# Patient Record
Sex: Female | Born: 1945
Health system: Southern US, Community
[De-identification: ages and names within clinical notes are randomized; demographics above are authoritative.]

## PROBLEM LIST (undated history)

## (undated) DIAGNOSIS — C801 Malignant (primary) neoplasm, unspecified: Secondary | ICD-10-CM

## (undated) DIAGNOSIS — I7101 Dissection of thoracic aorta: Secondary | ICD-10-CM

## (undated) DIAGNOSIS — Z95810 Presence of automatic (implantable) cardiac defibrillator: Secondary | ICD-10-CM

## (undated) DIAGNOSIS — K5790 Diverticulosis of intestine, part unspecified, without perforation or abscess without bleeding: Secondary | ICD-10-CM

## (undated) DIAGNOSIS — N183 Chronic kidney disease, stage 3 unspecified: Secondary | ICD-10-CM

## (undated) DIAGNOSIS — K219 Gastro-esophageal reflux disease without esophagitis: Secondary | ICD-10-CM

## (undated) DIAGNOSIS — N200 Calculus of kidney: Secondary | ICD-10-CM

## (undated) DIAGNOSIS — E785 Hyperlipidemia, unspecified: Secondary | ICD-10-CM

## (undated) DIAGNOSIS — I472 Ventricular tachycardia, unspecified: Secondary | ICD-10-CM

## (undated) DIAGNOSIS — I350 Nonrheumatic aortic (valve) stenosis: Secondary | ICD-10-CM

## (undated) DIAGNOSIS — Z87442 Personal history of urinary calculi: Secondary | ICD-10-CM

## (undated) DIAGNOSIS — I495 Sick sinus syndrome: Secondary | ICD-10-CM

## (undated) DIAGNOSIS — F419 Anxiety disorder, unspecified: Secondary | ICD-10-CM

## (undated) DIAGNOSIS — I10 Essential (primary) hypertension: Secondary | ICD-10-CM

## (undated) HISTORY — DX: Chronic kidney disease, stage 3 unspecified: N18.30

## (undated) HISTORY — DX: Dissection of thoracic aorta: I71.01

## (undated) HISTORY — DX: Diverticulosis of intestine, part unspecified, without perforation or abscess without bleeding: K57.90

## (undated) HISTORY — DX: Calculus of kidney: N20.0

## (undated) HISTORY — DX: Ventricular tachycardia, unspecified: I47.20

## (undated) HISTORY — DX: Gastro-esophageal reflux disease without esophagitis: K21.9

## (undated) HISTORY — DX: Anxiety disorder, unspecified: F41.9

## (undated) HISTORY — DX: Nonrheumatic aortic (valve) stenosis: I35.0

## (undated) HISTORY — DX: Essential (primary) hypertension: I10

## (undated) HISTORY — DX: Malignant (primary) neoplasm, unspecified: C80.1

## (undated) HISTORY — DX: Sick sinus syndrome: I49.5

## (undated) HISTORY — PX: APPENDECTOMY: SHX54

## (undated) HISTORY — DX: Hyperlipidemia, unspecified: E78.5

## (undated) HISTORY — DX: Ventricular tachycardia: I47.2

---

## 1982-11-29 HISTORY — PX: ABDOMINAL HYSTERECTOMY: SHX81

## 1998-11-29 HISTORY — PX: THORACIC AORTIC ANEURYSM REPAIR: SHX799

## 2004-03-31 ENCOUNTER — Inpatient Hospital Stay (HOSPITAL_COMMUNITY): Admission: EM | Admit: 2004-03-31 | Discharge: 2004-04-02 | Payer: Self-pay | Admitting: Emergency Medicine

## 2005-08-11 ENCOUNTER — Ambulatory Visit: Payer: Self-pay | Admitting: Family Medicine

## 2005-09-30 ENCOUNTER — Ambulatory Visit (HOSPITAL_COMMUNITY): Admission: RE | Admit: 2005-09-30 | Discharge: 2005-09-30 | Payer: Self-pay | Admitting: Cardiology

## 2006-08-09 ENCOUNTER — Ambulatory Visit: Payer: Self-pay | Admitting: Family Medicine

## 2006-08-12 ENCOUNTER — Encounter: Payer: Self-pay | Admitting: Family Medicine

## 2006-08-12 ENCOUNTER — Other Ambulatory Visit: Admission: RE | Admit: 2006-08-12 | Discharge: 2006-08-12 | Payer: Self-pay | Admitting: Family Medicine

## 2006-08-12 ENCOUNTER — Ambulatory Visit: Payer: Self-pay | Admitting: Family Medicine

## 2006-08-18 ENCOUNTER — Encounter: Admission: RE | Admit: 2006-08-18 | Discharge: 2006-08-18 | Payer: Self-pay | Admitting: Cardiology

## 2006-08-29 HISTORY — PX: PACEMAKER INSERTION: SHX728

## 2006-09-07 ENCOUNTER — Encounter: Admission: RE | Admit: 2006-09-07 | Discharge: 2006-09-07 | Payer: Self-pay | Admitting: *Deleted

## 2006-09-13 ENCOUNTER — Ambulatory Visit (HOSPITAL_COMMUNITY): Admission: RE | Admit: 2006-09-13 | Discharge: 2006-09-14 | Payer: Self-pay | Admitting: *Deleted

## 2006-11-29 DIAGNOSIS — I7101 Dissection of thoracic aorta: Secondary | ICD-10-CM

## 2006-11-29 DIAGNOSIS — I71019 Dissection of thoracic aorta, unspecified: Secondary | ICD-10-CM

## 2006-11-29 HISTORY — PX: REPAIR OF ACUTE ASCENDING THORACIC AORTIC DISSECTION: SHX6323

## 2006-11-29 HISTORY — DX: Dissection of thoracic aorta, unspecified: I71.019

## 2006-11-29 HISTORY — DX: Dissection of thoracic aorta: I71.01

## 2007-05-23 ENCOUNTER — Ambulatory Visit: Payer: Self-pay | Admitting: Family Medicine

## 2007-05-23 DIAGNOSIS — F419 Anxiety disorder, unspecified: Secondary | ICD-10-CM | POA: Insufficient documentation

## 2007-05-23 DIAGNOSIS — E785 Hyperlipidemia, unspecified: Secondary | ICD-10-CM | POA: Insufficient documentation

## 2007-05-23 DIAGNOSIS — I10 Essential (primary) hypertension: Secondary | ICD-10-CM | POA: Insufficient documentation

## 2007-07-07 ENCOUNTER — Ambulatory Visit: Payer: Self-pay | Admitting: Family Medicine

## 2007-07-07 DIAGNOSIS — R7309 Other abnormal glucose: Secondary | ICD-10-CM | POA: Insufficient documentation

## 2007-07-19 LAB — CONVERTED CEMR LAB
BUN: 15 mg/dL (ref 6–23)
Creatinine, Ser: 0.6 mg/dL (ref 0.4–1.2)
Hgb A1c MFr Bld: 6.2 % — ABNORMAL HIGH (ref 4.6–6.0)
Potassium: 3.6 meq/L (ref 3.5–5.1)

## 2007-08-17 ENCOUNTER — Encounter: Admission: RE | Admit: 2007-08-17 | Discharge: 2007-08-17 | Payer: Self-pay | Admitting: Cardiology

## 2007-09-02 ENCOUNTER — Ambulatory Visit: Payer: Self-pay | Admitting: Cardiothoracic Surgery

## 2007-09-02 ENCOUNTER — Inpatient Hospital Stay (HOSPITAL_COMMUNITY): Admission: EM | Admit: 2007-09-02 | Discharge: 2007-09-09 | Payer: Self-pay | Admitting: Emergency Medicine

## 2007-09-02 ENCOUNTER — Encounter: Payer: Self-pay | Admitting: Cardiothoracic Surgery

## 2007-09-21 ENCOUNTER — Encounter: Admission: RE | Admit: 2007-09-21 | Discharge: 2007-09-21 | Payer: Self-pay | Admitting: Cardiothoracic Surgery

## 2007-09-21 ENCOUNTER — Ambulatory Visit: Payer: Self-pay | Admitting: Cardiothoracic Surgery

## 2007-09-21 ENCOUNTER — Encounter: Payer: Self-pay | Admitting: Family Medicine

## 2007-09-28 ENCOUNTER — Ambulatory Visit: Payer: Self-pay | Admitting: Cardiothoracic Surgery

## 2007-09-28 ENCOUNTER — Encounter: Payer: Self-pay | Admitting: Family Medicine

## 2007-10-05 ENCOUNTER — Encounter (HOSPITAL_COMMUNITY): Admission: RE | Admit: 2007-10-05 | Discharge: 2007-11-29 | Payer: Self-pay | Admitting: Cardiology

## 2007-10-23 ENCOUNTER — Ambulatory Visit: Payer: Self-pay | Admitting: Family Medicine

## 2007-10-24 ENCOUNTER — Encounter: Payer: Self-pay | Admitting: Family Medicine

## 2007-10-24 DIAGNOSIS — I7101 Dissection of thoracic aorta: Secondary | ICD-10-CM

## 2007-10-24 DIAGNOSIS — I71019 Dissection of thoracic aorta, unspecified: Secondary | ICD-10-CM | POA: Insufficient documentation

## 2007-11-02 ENCOUNTER — Ambulatory Visit: Payer: Self-pay | Admitting: Family Medicine

## 2007-11-02 DIAGNOSIS — J069 Acute upper respiratory infection, unspecified: Secondary | ICD-10-CM | POA: Insufficient documentation

## 2007-11-08 ENCOUNTER — Telehealth (INDEPENDENT_AMBULATORY_CARE_PROVIDER_SITE_OTHER): Payer: Self-pay | Admitting: *Deleted

## 2007-11-09 ENCOUNTER — Ambulatory Visit: Payer: Self-pay | Admitting: Family Medicine

## 2007-11-09 ENCOUNTER — Encounter: Admission: RE | Admit: 2007-11-09 | Discharge: 2007-11-09 | Payer: Self-pay | Admitting: Family Medicine

## 2007-11-09 DIAGNOSIS — J209 Acute bronchitis, unspecified: Secondary | ICD-10-CM | POA: Insufficient documentation

## 2007-11-09 DIAGNOSIS — R059 Cough, unspecified: Secondary | ICD-10-CM | POA: Insufficient documentation

## 2007-11-09 DIAGNOSIS — R05 Cough: Secondary | ICD-10-CM

## 2007-11-13 ENCOUNTER — Ambulatory Visit: Payer: Self-pay | Admitting: Cardiothoracic Surgery

## 2007-11-16 ENCOUNTER — Telehealth (INDEPENDENT_AMBULATORY_CARE_PROVIDER_SITE_OTHER): Payer: Self-pay | Admitting: *Deleted

## 2007-11-22 ENCOUNTER — Telehealth (INDEPENDENT_AMBULATORY_CARE_PROVIDER_SITE_OTHER): Payer: Self-pay | Admitting: *Deleted

## 2007-11-30 ENCOUNTER — Encounter (HOSPITAL_COMMUNITY): Admission: RE | Admit: 2007-11-30 | Discharge: 2008-01-27 | Payer: Self-pay | Admitting: Cardiology

## 2008-01-03 ENCOUNTER — Telehealth (INDEPENDENT_AMBULATORY_CARE_PROVIDER_SITE_OTHER): Payer: Self-pay | Admitting: *Deleted

## 2008-01-08 ENCOUNTER — Telehealth (INDEPENDENT_AMBULATORY_CARE_PROVIDER_SITE_OTHER): Payer: Self-pay | Admitting: *Deleted

## 2008-02-19 ENCOUNTER — Telehealth (INDEPENDENT_AMBULATORY_CARE_PROVIDER_SITE_OTHER): Payer: Self-pay | Admitting: *Deleted

## 2008-03-20 ENCOUNTER — Ambulatory Visit: Payer: Self-pay | Admitting: Family Medicine

## 2008-03-21 ENCOUNTER — Encounter: Admission: RE | Admit: 2008-03-21 | Discharge: 2008-03-21 | Payer: Self-pay | Admitting: Cardiothoracic Surgery

## 2008-03-21 ENCOUNTER — Ambulatory Visit: Payer: Self-pay | Admitting: Cardiothoracic Surgery

## 2008-04-04 ENCOUNTER — Telehealth: Payer: Self-pay | Admitting: Internal Medicine

## 2008-04-25 ENCOUNTER — Telehealth (INDEPENDENT_AMBULATORY_CARE_PROVIDER_SITE_OTHER): Payer: Self-pay | Admitting: *Deleted

## 2008-05-28 ENCOUNTER — Telehealth (INDEPENDENT_AMBULATORY_CARE_PROVIDER_SITE_OTHER): Payer: Self-pay | Admitting: *Deleted

## 2008-06-10 ENCOUNTER — Telehealth (INDEPENDENT_AMBULATORY_CARE_PROVIDER_SITE_OTHER): Payer: Self-pay | Admitting: *Deleted

## 2008-07-23 ENCOUNTER — Ambulatory Visit: Payer: Self-pay | Admitting: Family Medicine

## 2008-07-23 ENCOUNTER — Encounter (INDEPENDENT_AMBULATORY_CARE_PROVIDER_SITE_OTHER): Payer: Self-pay | Admitting: *Deleted

## 2008-07-23 ENCOUNTER — Ambulatory Visit (HOSPITAL_BASED_OUTPATIENT_CLINIC_OR_DEPARTMENT_OTHER): Admission: RE | Admit: 2008-07-23 | Discharge: 2008-07-23 | Payer: Self-pay | Admitting: Family Medicine

## 2008-07-23 DIAGNOSIS — R221 Localized swelling, mass and lump, neck: Secondary | ICD-10-CM | POA: Insufficient documentation

## 2008-07-23 DIAGNOSIS — R220 Localized swelling, mass and lump, head: Secondary | ICD-10-CM | POA: Insufficient documentation

## 2008-07-24 ENCOUNTER — Telehealth (INDEPENDENT_AMBULATORY_CARE_PROVIDER_SITE_OTHER): Payer: Self-pay | Admitting: *Deleted

## 2008-07-24 LAB — CONVERTED CEMR LAB
ALT: 16 units/L (ref 0–35)
AST: 18 units/L (ref 0–37)
Albumin: 3.8 g/dL (ref 3.5–5.2)
Alkaline Phosphatase: 70 units/L (ref 39–117)
BUN: 15 mg/dL (ref 6–23)
Chloride: 107 meq/L (ref 96–112)
Cholesterol: 134 mg/dL (ref 0–200)
GFR calc Af Amer: 109 mL/min
Glucose, Bld: 121 mg/dL — ABNORMAL HIGH (ref 70–99)
Potassium: 4.6 meq/L (ref 3.5–5.1)
Total Protein: 7.6 g/dL (ref 6.0–8.3)
VLDL: 18 mg/dL (ref 0–40)

## 2008-07-25 ENCOUNTER — Ambulatory Visit: Payer: Self-pay | Admitting: Cardiothoracic Surgery

## 2008-07-25 ENCOUNTER — Encounter: Payer: Self-pay | Admitting: Internal Medicine

## 2008-07-28 LAB — CONVERTED CEMR LAB
Basophils Absolute: 0.1 10*3/uL (ref 0.0–0.1)
Eosinophils Absolute: 0.1 10*3/uL (ref 0.0–0.7)
MCHC: 34.4 g/dL (ref 30.0–36.0)
MCV: 92.8 fL (ref 78.0–100.0)
Neutrophils Relative %: 49.7 % (ref 43.0–77.0)
Platelets: 215 10*3/uL (ref 150–400)
RDW: 13.9 % (ref 11.5–14.6)

## 2008-07-30 ENCOUNTER — Ambulatory Visit: Payer: Self-pay | Admitting: Family Medicine

## 2008-07-30 LAB — CONVERTED CEMR LAB: Sed Rate: 42 mm/hr — ABNORMAL HIGH (ref 0–22)

## 2008-07-31 ENCOUNTER — Encounter (INDEPENDENT_AMBULATORY_CARE_PROVIDER_SITE_OTHER): Payer: Self-pay | Admitting: *Deleted

## 2008-08-15 ENCOUNTER — Telehealth (INDEPENDENT_AMBULATORY_CARE_PROVIDER_SITE_OTHER): Payer: Self-pay | Admitting: *Deleted

## 2008-08-26 ENCOUNTER — Telehealth (INDEPENDENT_AMBULATORY_CARE_PROVIDER_SITE_OTHER): Payer: Self-pay | Admitting: *Deleted

## 2008-09-19 ENCOUNTER — Ambulatory Visit: Payer: Self-pay | Admitting: Family Medicine

## 2008-09-19 DIAGNOSIS — E119 Type 2 diabetes mellitus without complications: Secondary | ICD-10-CM | POA: Insufficient documentation

## 2008-09-23 ENCOUNTER — Telehealth (INDEPENDENT_AMBULATORY_CARE_PROVIDER_SITE_OTHER): Payer: Self-pay | Admitting: *Deleted

## 2008-09-24 ENCOUNTER — Ambulatory Visit: Payer: Self-pay | Admitting: Family Medicine

## 2008-09-29 LAB — CONVERTED CEMR LAB
AST: 19 units/L (ref 0–37)
Albumin: 3.6 g/dL (ref 3.5–5.2)
Alkaline Phosphatase: 78 units/L (ref 39–117)
BUN: 12 mg/dL (ref 6–23)
Bilirubin, Direct: 0.1 mg/dL (ref 0.0–0.3)
Chloride: 108 meq/L (ref 96–112)
Cholesterol: 139 mg/dL (ref 0–200)
GFR calc non Af Amer: 90 mL/min
Glucose, Bld: 124 mg/dL — ABNORMAL HIGH (ref 70–99)
Potassium: 4.1 meq/L (ref 3.5–5.1)

## 2008-09-30 ENCOUNTER — Encounter (INDEPENDENT_AMBULATORY_CARE_PROVIDER_SITE_OTHER): Payer: Self-pay | Admitting: *Deleted

## 2008-10-02 ENCOUNTER — Encounter: Payer: Self-pay | Admitting: Family Medicine

## 2008-10-29 ENCOUNTER — Telehealth (INDEPENDENT_AMBULATORY_CARE_PROVIDER_SITE_OTHER): Payer: Self-pay | Admitting: *Deleted

## 2009-01-23 ENCOUNTER — Encounter: Payer: Self-pay | Admitting: Family Medicine

## 2009-01-27 ENCOUNTER — Telehealth: Payer: Self-pay | Admitting: Family Medicine

## 2009-03-13 ENCOUNTER — Telehealth (INDEPENDENT_AMBULATORY_CARE_PROVIDER_SITE_OTHER): Payer: Self-pay | Admitting: *Deleted

## 2009-03-27 ENCOUNTER — Encounter: Admission: RE | Admit: 2009-03-27 | Discharge: 2009-03-27 | Payer: Self-pay | Admitting: Cardiothoracic Surgery

## 2009-03-27 ENCOUNTER — Ambulatory Visit: Payer: Self-pay | Admitting: Cardiothoracic Surgery

## 2009-04-18 ENCOUNTER — Telehealth (INDEPENDENT_AMBULATORY_CARE_PROVIDER_SITE_OTHER): Payer: Self-pay | Admitting: *Deleted

## 2009-04-21 ENCOUNTER — Telehealth: Payer: Self-pay | Admitting: Family Medicine

## 2009-06-16 ENCOUNTER — Telehealth (INDEPENDENT_AMBULATORY_CARE_PROVIDER_SITE_OTHER): Payer: Self-pay | Admitting: *Deleted

## 2009-06-23 ENCOUNTER — Telehealth (INDEPENDENT_AMBULATORY_CARE_PROVIDER_SITE_OTHER): Payer: Self-pay | Admitting: *Deleted

## 2009-07-03 ENCOUNTER — Encounter: Payer: Self-pay | Admitting: Family Medicine

## 2009-07-10 ENCOUNTER — Ambulatory Visit: Payer: Self-pay | Admitting: Family Medicine

## 2009-07-10 ENCOUNTER — Encounter (INDEPENDENT_AMBULATORY_CARE_PROVIDER_SITE_OTHER): Payer: Self-pay | Admitting: *Deleted

## 2009-07-10 ENCOUNTER — Telehealth (INDEPENDENT_AMBULATORY_CARE_PROVIDER_SITE_OTHER): Payer: Self-pay | Admitting: *Deleted

## 2009-07-10 DIAGNOSIS — Z780 Asymptomatic menopausal state: Secondary | ICD-10-CM | POA: Insufficient documentation

## 2009-07-10 LAB — CONVERTED CEMR LAB
Ketones, urine, test strip: NEGATIVE
Microalb Creat Ratio: 12.5 mg/g (ref 0.0–30.0)
Nitrite: NEGATIVE
Urobilinogen, UA: NEGATIVE

## 2009-07-14 ENCOUNTER — Encounter (INDEPENDENT_AMBULATORY_CARE_PROVIDER_SITE_OTHER): Payer: Self-pay | Admitting: *Deleted

## 2009-07-14 LAB — CONVERTED CEMR LAB
ALT: 17 units/L (ref 0–35)
Alkaline Phosphatase: 64 units/L (ref 39–117)
BUN: 28 mg/dL — ABNORMAL HIGH (ref 6–23)
Basophils Relative: 0.5 % (ref 0.0–3.0)
Bilirubin, Direct: 0 mg/dL (ref 0.0–0.3)
Calcium: 9.8 mg/dL (ref 8.4–10.5)
Chloride: 106 meq/L (ref 96–112)
Creatinine, Ser: 0.9 mg/dL (ref 0.4–1.2)
Eosinophils Relative: 1.9 % (ref 0.0–5.0)
GFR calc non Af Amer: 67.26 mL/min (ref >60)
HDL: 50.5 mg/dL (ref >39.00)
Hgb A1c MFr Bld: 6.1 % (ref 4.6–6.5)
LDL Cholesterol: 84 mg/dL (ref 0–99)
Lymphocytes Relative: 27.1 % (ref 12.0–46.0)
Monocytes Relative: 7.4 % (ref 3.0–12.0)
Neutrophils Relative %: 63.1 % (ref 43.0–77.0)
Platelets: 163 10*3/uL (ref 150.0–400.0)
RBC: 4.1 M/uL (ref 3.87–5.11)
Total Bilirubin: 0.9 mg/dL (ref 0.3–1.2)
Total CHOL/HDL Ratio: 3
Total Protein: 8 g/dL (ref 6.0–8.3)
Triglycerides: 121 mg/dL (ref 0.0–149.0)
VLDL: 24.2 mg/dL (ref 0.0–40.0)
WBC: 6.4 10*3/uL (ref 4.5–10.5)

## 2009-07-22 ENCOUNTER — Telehealth (INDEPENDENT_AMBULATORY_CARE_PROVIDER_SITE_OTHER): Payer: Self-pay | Admitting: *Deleted

## 2009-08-19 ENCOUNTER — Emergency Department (HOSPITAL_COMMUNITY): Admission: EM | Admit: 2009-08-19 | Discharge: 2009-08-19 | Payer: Self-pay | Admitting: Emergency Medicine

## 2009-09-09 ENCOUNTER — Ambulatory Visit: Payer: Self-pay | Admitting: Family Medicine

## 2009-09-12 ENCOUNTER — Telehealth (INDEPENDENT_AMBULATORY_CARE_PROVIDER_SITE_OTHER): Payer: Self-pay | Admitting: *Deleted

## 2009-10-07 ENCOUNTER — Telehealth (INDEPENDENT_AMBULATORY_CARE_PROVIDER_SITE_OTHER): Payer: Self-pay | Admitting: *Deleted

## 2009-11-04 ENCOUNTER — Telehealth: Payer: Self-pay | Admitting: Family Medicine

## 2009-11-11 ENCOUNTER — Telehealth: Payer: Self-pay | Admitting: Family Medicine

## 2009-12-10 ENCOUNTER — Telehealth: Payer: Self-pay | Admitting: Family Medicine

## 2009-12-22 ENCOUNTER — Telehealth: Payer: Self-pay | Admitting: Family Medicine

## 2009-12-23 ENCOUNTER — Telehealth: Payer: Self-pay | Admitting: Family Medicine

## 2009-12-26 ENCOUNTER — Ambulatory Visit: Payer: Self-pay | Admitting: Family Medicine

## 2010-01-07 ENCOUNTER — Encounter: Payer: Self-pay | Admitting: Family Medicine

## 2010-01-16 ENCOUNTER — Encounter: Payer: Self-pay | Admitting: Family Medicine

## 2010-01-29 ENCOUNTER — Telehealth: Payer: Self-pay | Admitting: Family Medicine

## 2010-02-09 ENCOUNTER — Telehealth: Payer: Self-pay | Admitting: Family Medicine

## 2010-03-05 ENCOUNTER — Telehealth: Payer: Self-pay | Admitting: Family Medicine

## 2010-03-05 ENCOUNTER — Encounter: Payer: Self-pay | Admitting: Family Medicine

## 2010-03-05 ENCOUNTER — Encounter: Admission: RE | Admit: 2010-03-05 | Discharge: 2010-03-05 | Payer: Self-pay | Admitting: Cardiothoracic Surgery

## 2010-03-05 ENCOUNTER — Ambulatory Visit: Payer: Self-pay | Admitting: Cardiothoracic Surgery

## 2010-04-20 ENCOUNTER — Telehealth: Payer: Self-pay | Admitting: Family Medicine

## 2010-05-07 ENCOUNTER — Telehealth: Payer: Self-pay | Admitting: Family Medicine

## 2010-06-18 ENCOUNTER — Encounter: Payer: Self-pay | Admitting: Family Medicine

## 2010-08-04 ENCOUNTER — Telehealth: Payer: Self-pay | Admitting: Family Medicine

## 2010-08-17 ENCOUNTER — Telehealth (INDEPENDENT_AMBULATORY_CARE_PROVIDER_SITE_OTHER): Payer: Self-pay | Admitting: *Deleted

## 2010-08-18 ENCOUNTER — Encounter: Payer: Self-pay | Admitting: Family Medicine

## 2010-08-31 ENCOUNTER — Telehealth: Payer: Self-pay | Admitting: Family Medicine

## 2010-10-09 ENCOUNTER — Encounter (INDEPENDENT_AMBULATORY_CARE_PROVIDER_SITE_OTHER): Payer: Self-pay | Admitting: *Deleted

## 2010-10-09 ENCOUNTER — Telehealth: Payer: Self-pay | Admitting: Family Medicine

## 2010-10-12 ENCOUNTER — Telehealth (INDEPENDENT_AMBULATORY_CARE_PROVIDER_SITE_OTHER): Payer: Self-pay | Admitting: *Deleted

## 2010-10-15 ENCOUNTER — Encounter: Payer: Self-pay | Admitting: Family Medicine

## 2010-10-16 ENCOUNTER — Encounter (INDEPENDENT_AMBULATORY_CARE_PROVIDER_SITE_OTHER): Payer: Self-pay | Admitting: *Deleted

## 2010-11-05 ENCOUNTER — Encounter (INDEPENDENT_AMBULATORY_CARE_PROVIDER_SITE_OTHER): Payer: Self-pay | Admitting: *Deleted

## 2010-11-05 ENCOUNTER — Emergency Department (HOSPITAL_BASED_OUTPATIENT_CLINIC_OR_DEPARTMENT_OTHER)
Admission: EM | Admit: 2010-11-05 | Discharge: 2010-11-05 | Payer: Self-pay | Source: Home / Self Care | Admitting: Emergency Medicine

## 2010-11-05 ENCOUNTER — Telehealth: Payer: Self-pay | Admitting: Family Medicine

## 2010-11-06 ENCOUNTER — Ambulatory Visit: Payer: Self-pay | Admitting: Family Medicine

## 2010-11-19 ENCOUNTER — Telehealth: Payer: Self-pay | Admitting: Family Medicine

## 2010-12-01 ENCOUNTER — Telehealth: Payer: Self-pay | Admitting: Family Medicine

## 2010-12-01 ENCOUNTER — Encounter: Payer: Self-pay | Admitting: Family Medicine

## 2010-12-09 ENCOUNTER — Other Ambulatory Visit: Payer: Self-pay | Admitting: Family Medicine

## 2010-12-09 ENCOUNTER — Encounter: Payer: Self-pay | Admitting: Family Medicine

## 2010-12-09 ENCOUNTER — Ambulatory Visit
Admission: RE | Admit: 2010-12-09 | Discharge: 2010-12-09 | Payer: Self-pay | Source: Home / Self Care | Attending: Family Medicine | Admitting: Family Medicine

## 2010-12-09 ENCOUNTER — Encounter (INDEPENDENT_AMBULATORY_CARE_PROVIDER_SITE_OTHER): Payer: Self-pay | Admitting: *Deleted

## 2010-12-09 DIAGNOSIS — Z8719 Personal history of other diseases of the digestive system: Secondary | ICD-10-CM | POA: Insufficient documentation

## 2010-12-09 LAB — BASIC METABOLIC PANEL
BUN: 21 mg/dL (ref 6–23)
CO2: 26 mEq/L (ref 19–32)
Calcium: 9.4 mg/dL (ref 8.4–10.5)
Chloride: 107 mEq/L (ref 96–112)
Creatinine, Ser: 0.7 mg/dL (ref 0.4–1.2)
GFR: 95.78 mL/min (ref >60.00)
Glucose, Bld: 111 mg/dL — ABNORMAL HIGH (ref 70–99)
Potassium: 4.6 mEq/L (ref 3.5–5.1)
Sodium: 142 mEq/L (ref 135–145)

## 2010-12-09 LAB — TSH: TSH: 0.96 u[IU]/mL (ref 0.35–5.50)

## 2010-12-09 LAB — HEPATIC FUNCTION PANEL
ALT: 18 U/L (ref 0–35)
AST: 18 U/L (ref 0–37)
Albumin: 4.1 g/dL (ref 3.5–5.2)
Alkaline Phosphatase: 61 U/L (ref 39–117)
Bilirubin, Direct: 0.1 mg/dL (ref 0.0–0.3)
Total Bilirubin: 0.7 mg/dL (ref 0.3–1.2)
Total Protein: 7.5 g/dL (ref 6.0–8.3)

## 2010-12-09 LAB — CONVERTED CEMR LAB
Bilirubin Urine: NEGATIVE
Blood in Urine, dipstick: NEGATIVE
Glucose, Urine, Semiquant: NEGATIVE
Protein, U semiquant: NEGATIVE
Specific Gravity, Urine: 1.02
WBC Urine, dipstick: NEGATIVE
pH: 5

## 2010-12-09 LAB — CBC WITH DIFFERENTIAL/PLATELET
Basophils Absolute: 0 10*3/uL (ref 0.0–0.1)
Basophils Relative: 0.4 % (ref 0.0–3.0)
Eosinophils Absolute: 0.1 10*3/uL (ref 0.0–0.7)
Eosinophils Relative: 1.6 % (ref 0.0–5.0)
HCT: 39.1 % (ref 36.0–46.0)
Hemoglobin: 13.3 g/dL (ref 12.0–15.0)
Lymphocytes Relative: 33.8 % (ref 12.0–46.0)
Lymphs Abs: 1.8 10*3/uL (ref 0.7–4.0)
MCHC: 34.1 g/dL (ref 30.0–36.0)
MCV: 96.5 fl (ref 78.0–100.0)
Monocytes Absolute: 0.4 10*3/uL (ref 0.1–1.0)
Monocytes Relative: 7.1 % (ref 3.0–12.0)
Neutro Abs: 3.1 10*3/uL (ref 1.4–7.7)
Neutrophils Relative %: 57.1 % (ref 43.0–77.0)
Platelets: 164 10*3/uL (ref 150.0–400.0)
RBC: 4.05 Mil/uL (ref 3.87–5.11)
RDW: 13.7 % (ref 11.5–14.6)
WBC: 5.4 10*3/uL (ref 4.5–10.5)

## 2010-12-09 LAB — LIPID PANEL
Cholesterol: 153 mg/dL (ref 0–200)
HDL: 55.7 mg/dL (ref >39.00)
LDL Cholesterol: 77 mg/dL (ref 0–99)
Total CHOL/HDL Ratio: 3
Triglycerides: 103 mg/dL (ref 0.0–149.0)
VLDL: 20.6 mg/dL (ref 0.0–40.0)

## 2010-12-09 LAB — MICROALBUMIN / CREATININE URINE RATIO
Creatinine,U: 91.1 mg/dL
Microalb Creat Ratio: 1.9 mg/g (ref 0.0–30.0)
Microalb, Ur: 1.7 mg/dL (ref 0.0–1.9)

## 2010-12-09 LAB — HEMOGLOBIN A1C: Hgb A1c MFr Bld: 6.6 % — ABNORMAL HIGH (ref 4.6–6.5)

## 2010-12-15 ENCOUNTER — Telehealth (INDEPENDENT_AMBULATORY_CARE_PROVIDER_SITE_OTHER): Payer: Self-pay | Admitting: *Deleted

## 2010-12-15 LAB — CONVERTED CEMR LAB: Vit D, 25-Hydroxy: 13 ng/mL — ABNORMAL LOW (ref 30–89)

## 2010-12-19 ENCOUNTER — Encounter: Payer: Self-pay | Admitting: Family Medicine

## 2010-12-20 ENCOUNTER — Encounter: Payer: Self-pay | Admitting: Family Medicine

## 2010-12-29 NOTE — Progress Notes (Signed)
Summary:  CLONAZEPAM 0.5 MG   Phone Note Refill Request Message from:  Fax from Pharmacy on August 17, 2010 9:37 AM  Refills Requested: Medication #1:  CLONAZEPAM 0.5 MG TABS take 1 tab twice a day [facility] [address] - fax [phone] - was faxed [phone] pharmacy sent 2nd request  Initial call taken by: Okey Regal Spring,  August 17, 2010 9:39 AM  Follow-up for Phone Call        last filled  04-20-10 #60 2, last ov 12-26-09 ..............Marland KitchenFelecia Deloach CMA  August 17, 2010 11:46 AM   Additional Follow-up for Phone Call Additional follow up Details #1::        refill x1  2 refils Additional Follow-up by: Loreen Freud DO,  August 17, 2010 12:28 PM    Additional Follow-up for Phone Call Additional follow up Details #2::    [facility] [phone] Follow-up by: Almeta Monas CMA Duncan Dull),  August 18, 2010 8:46 AM  Prescriptions: CLONAZEPAM 0.5 MG TABS (CLONAZEPAM) take 1 tab twice a day  #60 x 2   Entered and Authorized by:   Loreen Freud DO   Signed by:   Loreen Freud DO on 08/17/2010   Method used:   Print then Give to Patient   RxID:   [id number]

## 2010-12-29 NOTE — Progress Notes (Signed)
Summary: Refill Request  Phone Note Refill Request Message from:  [facility] [address] Fax #: [phone]  Refills Requested: Medication #1:  XANAX 0.5 MG  TABS 1 by mouth three times a day as needed   Dosage confirmed as above?Dosage Confirmed   Brand Name Necessary? No   Supply Requested: 1 month   Last Refilled: 12/11/2009 Next Appointment Scheduled: none Initial call taken by: Harold Barban,  February 09, 2010 8:54 AM  Follow-up for Phone Call        last ov- 12/26/09. Army Fossa CMA  February 09, 2010 9:46 AM   Additional Follow-up for Phone Call Additional follow up Details #1::        ok to refill x1  3 refills Additional Follow-up by: Loreen Freud DO,  February 09, 2010 9:56 AM    Prescriptions: Prudy Feeler 0.5 MG  TABS (ALPRAZOLAM) 1 by mouth three times a day as needed  #90 x 3   Entered by:   Army Fossa CMA   Authorized by:   Loreen Freud DO   Signed by:   Army Fossa CMA on 02/09/2010   Method used:   Printed then faxed to ...       [facility] [address] (retail)       [address]       [place], [place]  [address]       Ph: [phone]       Fax: [phone]   RxID:   [id number]

## 2010-12-29 NOTE — Progress Notes (Signed)
Summary: REFILL  Phone Note Refill Request Message from:  Fax from [facility] on [facility] FAX [phone]  Refills Requested: Medication #1:  CLONAZEPAM 0.5 MG TABS take 1 tab twice a day   Last Refilled: 11/11/2009 last ov- 06/2009 Army Fossa CMA  December 22, 2009 11:25 AM   Initial call taken by: Barb Merino,  December 22, 2009 11:15 AM  Follow-up for Phone Call        ok to refill Follow-up by: Loreen Freud DO,  December 22, 2009 11:42 AM    Prescriptions: CLONAZEPAM 0.5 MG TABS (CLONAZEPAM) take 1 tab twice a day  #60 x 0   Entered by:   Army Fossa CMA   Authorized by:   Loreen Freud DO   Signed by:   Army Fossa CMA on 12/22/2009   Method used:   Printed then faxed to ...       [facility] [address] (retail)       [address]       [place], [place]  [address]       Ph: [phone]       Fax: [phone]   RxID:   [id number]

## 2010-12-29 NOTE — Letter (Signed)
Summary: Primary Care Appointment Letter  [facility]  [address] [place], [place] [address]   Phone: [phone]  Fax: [phone]    10/09/2010 MRN: [id number]  [facility] [address] [place], [place]  [address]  Dear Ms. [name],   Your Primary Care Physician Loreen Freud DO has indicated that:    ___X___it is time to schedule an appointment.    _______you missed your appointment on______ and need to call and          reschedule.    _X______you need to have lab work done.    _______you need to schedule an appointment discuss lab or test results.    _______you need to call to reschedule your appointment that is                       scheduled on _________.     Please call our office as soon as possible. Our phone number is [phone]. Please press option 1. Our office is open 8a-12noon and 1p-5p, Monday through Friday.     Thank you,    [facility]

## 2010-12-29 NOTE — Progress Notes (Signed)
Summary: REFILL  Phone Note Refill Request Message from:  [facility]  [facility] FAX [phone]  Refills Requested: Medication #1:  XANAX 0.5 MG  TABS 1 by mouth three times a day as needed   Last Refilled: 11/04/2009 Last OV- 07/10/2009  Initial call taken by: Barb Merino,  December 10, 2009 10:55 AM  Follow-up for Phone Call        ok to refill Follow-up by: Loreen Freud DO,  December 11, 2009 9:56 AM    Prescriptions: Prudy Feeler 0.5 MG  TABS (ALPRAZOLAM) 1 by mouth three times a day as needed  #90 x 0   Entered by:   Army Fossa CMA   Authorized by:   Loreen Freud DO   Signed by:   Army Fossa CMA on 12/11/2009   Method used:   Printed then faxed to ...       [facility] [address] (retail)       [address]       [place], [place]  [address]       Ph: [phone]       Fax: [phone]   RxID:   [id number]

## 2010-12-29 NOTE — Progress Notes (Signed)
Summary: REFILL  Phone Note Refill Request Message from:  Fax from Pharmacy on March 05, 2010 9:09 AM  Refills Requested: Medication #1:  CLONAZEPAM 0.5 MG TABS take 1 tab twice a day   Last Refilled: 01/29/2010 [facility] [address] FAX [phone]   Method Requested: Fax to Local Pharmacy Next Appointment Scheduled: NO APPT Initial call taken by: Barb Merino,  March 05, 2010 9:10 AM  Follow-up for Phone Call        last ov- 12/26/09- breast exam. Army Fossa CMA  March 05, 2010 9:15 AM   Additional Follow-up for Phone Call Additional follow up Details #1::        ok to refill x1 Additional Follow-up by: Loreen Freud DO,  March 05, 2010 10:56 AM    Prescriptions: CLONAZEPAM 0.5 MG TABS (CLONAZEPAM) take 1 tab twice a day  #60 x 0   Entered by:   Army Fossa CMA   Authorized by:   Loreen Freud DO   Signed by:   Army Fossa CMA on 03/05/2010   Method used:   Printed then faxed to ...       [facility] [address] (retail)       [address]       [place], [place]  [address]       Ph: [phone]       Fax: [phone]   RxID:   [id number]

## 2010-12-29 NOTE — Letter (Signed)
Summary: [facility]  [facility]   Imported By: Lanelle Bal 03/27/2010 10:38:02  _____________________________________________________________________  External Attachment:    Type:   Image     Comment:   External Document

## 2010-12-29 NOTE — Progress Notes (Signed)
Summary: Refill Request  Phone Note Refill Request Call back at [phone] Message from:  Pharmacy on May 07, 2010 10:31 AM  Refills Requested: Medication #1:  XANAX 0.5 MG  TABS 1 by mouth three times a day as needed   Dosage confirmed as above?Dosage Confirmed   Supply Requested: 1 month   Last Ref[facility]: 04/07/2010 [facility] [address]  Next Appointment Scheduled: none Initial call taken by: Harold Barban,  May 07, 2010 10:32 AM  Follow-up for Phone Call        last ov- 12/26/09. Army Fossa CMA  May 07, 2010 10:45 AM   Additional Follow-up for Phone Call Additional follow up Details #1::        refill x1  2 refills Additional Follow-up by: Loreen Freud DO,  May 07, 2010 1:18 PM    Prescriptions: Prudy Feeler 0.5 MG  TABS (ALPRAZOLAM) 1 by mouth three times a day as needed  #90 x 2   Entered by:   Army Fossa CMA   Authorized by:   Loreen Freud DO   Signed by:   Army Fossa CMA on 05/07/2010   Method used:   Printed then faxed to ...       [facility] [address] (retail)       [address]       [place], [place]  [address]       Ph: [phone]       Fax: [phone]   RxID:   [id number]

## 2010-12-29 NOTE — Letter (Signed)
Summary: [facility]  [facility]   Imported By: Lanelle Bal 09/09/2010 14:26:05  _____________________________________________________________________  External Attachment:    Type:   Image     Comment:   External Document

## 2010-12-29 NOTE — Progress Notes (Signed)
Summary: Alprazolam refill  Phone Note Refill Request Message from:  Fax from Pharmacy on November 05, 2010 12:16 PM  Refills Requested: Medication #1:  XANAX 0.5 MG  TABS 1 by mouth three times a day as needed   Last Refilled: 10/09/2010 [facility] [place], [place]      [phone]    [phone]    qty=90  Next Appointment Scheduled: Fri 11/06/2010 Initial call taken by: Jerolyn Shin,  November 05, 2010 12:17 PM  Follow-up for Phone Call        last ov- 12/26/09, last filled 12-09-09 #90.......Marland KitchenFelecia Deloach CMA  November 05, 2010 2:56 PM   Additional Follow-up for Phone Call Additional follow up Details #1::        ok to refillx1 Additional Follow-up by: Loreen Freud DO,  November 05, 2010 2:58 PM    Prescriptions: Prudy Feeler 0.5 MG  TABS (ALPRAZOLAM) 1 by mouth three times a day as needed  #90 x 0   Entered by:   Jeremy Johann CMA   Authorized by:   Loreen Freud DO   Signed by:   Jeremy Johann CMA on 11/05/2010   Method used:   Printed then faxed to ...       [facility] [address] (retail)       [address]       [place], [place]  [address]       Ph: [phone]       Fax: [phone]   RxID:   [id number]

## 2010-12-29 NOTE — Letter (Signed)
Summary: [facility]  [facility]   Imported By: Lennie Odor 06/24/2010 13:22:47  _____________________________________________________________________  External Attachment:    Type:   Image     Comment:   External Document

## 2010-12-29 NOTE — Progress Notes (Signed)
Summary: Refill Request  Phone Note Refill Request Message from:  [facility] [address] Fax #: [phone]  Refills Requested: Medication #1:  CLONAZEPAM 0.5 MG TABS take 1 tab twice a day   Dosage confirmed as above?Dosage Confirmed   Brand Name Necessary? No   Supply Requested: 1 month   Last Refilled: 09/25/2009 Next Appointment Scheduled: none Initial call taken by: Harold Barban,  January 29, 2010 8:35 AM  Follow-up for Phone Call        last filled 12/22/09, last ov 12/26/09. Army Fossa CMA  January 29, 2010 8:50 AM   Additional Follow-up for Phone Call Additional follow up Details #1::        OK TO REFILL Additional Follow-up by: Loreen Freud DO,  January 29, 2010 10:49 AM    Prescriptions: CLONAZEPAM 0.5 MG TABS (CLONAZEPAM) take 1 tab twice a day  #60 x 0   Entered by:   Army Fossa CMA   Authorized by:   Loreen Freud DO   Signed by:   Army Fossa CMA on 01/29/2010   Method used:   Printed then faxed to ...       [facility] [address] (retail)       [address]       [place], [place]  [address]       Ph: [phone]       Fax: [phone]   RxID:   [id number]

## 2010-12-29 NOTE — Progress Notes (Signed)
Summary: refill  Phone Note Refill Request Message from:  Fax from Pharmacy on October 12, 2010 2:19 PM  Refills Requested: Medication #1:  JANUVIA 100 MG TABS TAKE 1 TAB once daily**pt due for labs***. [facility] [address] - fax [phone]  Initial call taken by: Okey Regal Spring,  October 12, 2010 2:20 PM    Prescriptions: JANUVIA 100 MG TABS (SITAGLIPTIN PHOSPHATE) TAKE 1 TAB once daily**pt due for labs***  #15 Not Speci x 0   Entered by:   Almeta Monas CMA (AAMA)   Authorized by:   Loreen Freud DO   Signed by:   Almeta Monas CMA (AAMA) on 10/13/2010   Method used:   Electronically to        [facility] [address] (retail)       [address]       [place], [place]  [address]       Ph: [phone]       Fax: [phone]   RxID:   [id number]

## 2010-12-29 NOTE — Progress Notes (Signed)
Summary: refill  Phone Note Refill Request Message from:  Fax from Pharmacy on October 09, 2010 10:55 AM  Refills Requested: Medication #1:  XANAX 0.5 MG  TABS 1 by mouth three times a day as needed [facility] [address] - fax [phone]  Initial call taken by: Okey Regal Spring,  October 09, 2010 10:55 AM  Follow-up for Phone Call        Last Filled 08-04-10 #90 1, last OV 12-26-09...Marland KitchenMarland KitchenFelecia Deloach CMA  October 09, 2010 11:49 AM   Additional Follow-up for Phone Call Additional follow up Details #1::        ok for #90, no refills Additional Follow-up by: Neena Rhymes MD,  October 09, 2010 12:01 PM    Prescriptions: Prudy Feeler 0.5 MG  TABS (ALPRAZOLAM) 1 by mouth three times a day as needed  #90 x 0   Entered by:   Jeremy Johann CMA   Authorized by:   Neena Rhymes MD   Signed by:   Jeremy Johann CMA on 10/09/2010   Method used:   Printed then faxed to ...       [facility] [address] ([facility]ail)       [address]       [place], [place]  [address]       Ph: [phone]       Fax: [phone]   RxID:   [id number]

## 2010-12-29 NOTE — Miscellaneous (Signed)
Summary: Immunization Entry   Immunization History:  Influenza Immunization History:    Influenza:  historical @ [employer] (10/15/2010)

## 2010-12-29 NOTE — Progress Notes (Signed)
Summary: mammogram order  Phone Note Call from Patient   Caller: Patient Summary of Call: pt calling requested a order for a diagnostic mammogram to be sent to [facility]. pt states that she had a cross section aortic. dr Laury Axon pls advise ok to order mammogram.. mammogram screening order back in 06/2009 was not done due to family issues..................Marland KitchenFelecia Deloach CMA  December 23, 2009 4:25 PM   Follow-up for Phone Call        They usually send [name] a request for what they want.  I'm not clear on why.  may need to call [facility].   Follow-up by: Loreen Freud DO,  December 23, 2009 5:09 PM  Additional Follow-up for Phone Call Additional follow up Details #1::        left message to call office.........................Marland KitchenFelecia Deloach CMA  December 24, 2009 8:24 AM   i spoke with [facility] they states that they have no idea why pt request this and that they have no schedule appt for pt. called pt back per pt she had open heart surgery and in her sternum where the two breast connect them there is some type if place that looks and feels funny. pt thinks that it may be scar tissue but she wants to make sure. pt states that a diagnostic mammogram is require per [facility] so that it may go deep enough in the tissue............Marland KitchenFelecia Deloach CMA  December 24, 2009 8:42 AM     Additional Follow-up for Phone Call Additional follow up Details #2::    I would need to see her so I know exactly what to write on order.   Follow-up by: Loreen Freud DO,  December 24, 2009 8:49 AM  Additional Follow-up for Phone Call Additional follow up Details #3:: Details for Additional Follow-up Action Taken: pt aware appt schedule..............................Marland KitchenFelecia Deloach CMA  December 24, 2009 9:10 AM

## 2010-12-29 NOTE — Miscellaneous (Signed)
Summary: [facility]  [facility]   Imported By: Lanelle Bal 10/26/2010 11:56:27  _____________________________________________________________________  External Attachment:    Type:   Image     Comment:   External Document

## 2010-12-29 NOTE — Progress Notes (Signed)
Summary: AZOR REFILL  Phone Note Refill Request Message from:  Fax from Pharmacy on August 31, 2010 4:30 PM  Refills Requested: Medication #1:  AZOR 10-20 MG TABS take 1 tab once daily [facility]  [address] [facility]     FAX  [phone]   QTY = 30  Initial call taken by: Jerolyn Shin,  August 31, 2010 4:30 PM  Follow-up for Phone Call        Last OV in Jan 28,11 no upcoming, no recent labs and Rx never filled by this office. Please advise  msg left for pt to contact office. Follow-up by: Almeta Monas CMA Duncan Dull),  August 31, 2010 4:41 PM  Additional Follow-up for Phone Call Additional follow up Details #1::        Spk with the pt and Rx was sent to the wrong ofifce. Pt will call cardiologist and have them fill Azor. Additional Follow-up by: Almeta Monas CMA Duncan Dull),  August 31, 2010 4:47 PM

## 2010-12-29 NOTE — Letter (Signed)
Summary: [facility]  [facility]   Imported By: Lanelle Bal 01/21/2010 11:28:48  _____________________________________________________________________  External Attachment:    Type:   Image     Comment:   External Document

## 2010-12-29 NOTE — Progress Notes (Signed)
Summary: Refill Request  Phone Note Refill Request Call back at [phone] Message from:  Pharmacy on Apr 20, 2010 8:42 AM  Refills Requested: Medication #1:  CLONAZEPAM 0.5 MG TABS take 1 tab twice a day   Dosage confirmed as above?Dosage Confirmed   Supply Requested: 1 month   Last Ref[facility]: [facility]/05/2010 [facility] [address]  Next Appointment Scheduled: none Initial call taken by: Harold Barban,  Apr 20, 2010 8:42 AM  Follow-up for Phone Call        last ov- 12/26/09. Army Fossa CMA  Apr 20, 2010 8:57 AM   Additional Follow-up for Phone Call Additional follow up Details #1::        refill x1  2 refills Additional Follow-up by: Loreen Freud DO,  Apr 20, 2010 10:19 AM    Prescriptions: CLONAZEPAM 0.5 MG TABS (CLONAZEPAM) take 1 tab twice a day  #60 x 2   Entered and Authorized by:   Loreen Freud DO   Signed by:   Loreen Freud DO on 04/20/2010   Method used:   Print then Give to Patient   RxID:   [id number]

## 2010-12-29 NOTE — Progress Notes (Signed)
Summary: refill  Phone Note Refill Request Message from:  Fax from Pharmacy on August 04, 2010 10:26 AM  Refills Requested: Medication #1:  CLONAZEPAM 0.5 MG TABS take 1 tab twice a day  Medication #2:  XANAX 0.5 MG  TABS 1 by mouth three times a day as needed [facility] [address] - fax [phone]  Initial call taken by: Okey Regal Spring,  August 04, 2010 10:26 AM  Follow-up for Phone Call        Refill Request Last OV 12/26/09.  Last filled Xanax 05/07/10 and Clonazepam 04/20/10. Please advise Almeta Monas CMA Duncan Dull)  August 04, 2010 10:55 AM   Additional Follow-up for Phone Call Additional follow up Details #1::        refill x1 each Additional Follow-up by: Loreen Freud DO,  August 04, 2010 11:37 AM    Prescriptions: CITALOPRAM HYDROBROMIDE 20 MG TABS (CITALOPRAM HYDROBROMIDE) 1 by mouth once daily  #30 Tablet x 1   Entered and Authorized by:   Almeta Monas CMA (AAMA)   Signed by:   Almeta Monas CMA (AAMA) on 08/04/2010   Method used:   Printed then faxed to ...       [facility] [address] ([facility]ail)       [address]       [place], [place]  [address]       Ph: [phone]       Fax: [phone]   RxID:   [id number] Prudy Feeler 0.5 MG  TABS (ALPRAZOLAM) 1 by mouth three times a day as needed  #90 x 1   Entered and Authorized by:   Almeta Monas CMA (AAMA)   Signed by:   Almeta Monas CMA (AAMA) on 08/04/2010   Method used:   Printed then faxed to ...       [facility] [address] (retail)       [address]       [place], [place]  [address]       Ph: [phone]       Fax: [phone]   RxID:   [id number]  Pt aware.  Almeta Monas CMA Duncan Dull)  August 04, 2010 12:08 PM

## 2010-12-29 NOTE — Assessment & Plan Note (Signed)
Summary: breast exam,diagnostic mammogram//fd   Vital Signs:  Patient profile:   65 year old female Weight:      210 pounds Temp:     98.1 degrees F oral Pulse rate:   74 / minute Pulse rhythm:   regular BP sitting:   130 / 80  (left arm) Cuff size:   large  Vitals Entered By: Army Fossa CMA (December 26, 2009 11:07 AM) CC: Pt would like a diagnostic mammogram ordered.    History of Present Illness: Pt here to discuss diagnostic mammo.  Pt felt something under both breasts that worried her. No other complaints  Allergies (verified): No Known Drug Allergies  Physical Exam  General:  Well-developed,well-nourished,in no acute distress; alert,appropriate and cooperative throughout examination Breasts:  No mass, nodules, thickening, tenderness, bulging, retraction, inflamation, nipple discharge or skin changes noted.   Psych:  Oriented X3 and normally interactive.     Impression & Recommendations:  Problem # 1:  OTHER SCREENING BREAST EXAMINATION (ICD-V76.19) normal breast exam to get screening mammogram  Complete Medication List: 1)  Aspirin Buffered 325 Mg Tabs (Aspirin buff(mgcarb-alaminoac)) .... Take 1 tab once daily 2)  Furosemide 20 Mg Tabs (Furosemide) .... As needed 3)  Crestor 20 Mg Tabs (Rosuvastatin calcium) .[name].. 1 at bedtime 4)  Xanax 0.5 Mg Tabs (Alprazolam) .[name].. 1 by mouth three times a day as needed 5)  Carvedilol 12.5 Mg Tabs (Carvedilol) .... Take one tablet twice daily 6)  Citalopram Hydrobromide 20 Mg Tabs (Citalopram hydrobromide) .[name].. 1 by mouth once daily 7)  Clonazepam 0.5 Mg Tabs (Clonazepam) .... Take 1 tab twice a day 8)  Azor 10-20 Mg Tabs (Amlodipine-olmesartan) .... Take 1 tab once daily 9)  Januvia 100 Mg Tabs (Sitagliptin phosphate) .... Take 1 tab once daily**pt due for labs***

## 2010-12-31 NOTE — Progress Notes (Signed)
Summary: Clonazepam refill--going out of town  Phone Note Refill Request Call back at [facility] Phone [phone] Message from:  Patient on November 19, 2010 12:02 PM  Refills Requested: Medication #1:  CLONAZEPAM 0.5 MG TABS take 1 tab twice a day [facility]------Leaving town Sat 12/24, will return 12/03/2010--needs to pick up on Friday 12/23  Next Appointment Scheduled: none Initial call taken by: Jerolyn Shin,  November 19, 2010 12:03 PM  Follow-up for Phone Call        last filled 08/17/10 with 2 refills and seen 12/26/09.Marland KitchenMarland KitchenPt is going out of town until 12/03/2010... please advise. Follow-up by: Almeta Monas CMA Duncan Dull),  November 19, 2010 1:14 PM  Additional Follow-up for Phone Call Additional follow up Details #1::        refill x1 Additional Follow-up by: Loreen Freud DO,  November 19, 2010 1:35 PM    Prescriptions: CLONAZEPAM 0.5 MG TABS (CLONAZEPAM) take 1 tab twice a day  #60 x 2   Entered by:   Almeta Monas CMA (AAMA)   Authorized by:   Loreen Freud DO   Signed by:   Almeta Monas CMA (AAMA) on 11/19/2010   Method used:   Printed then faxed to ...       [facility] [address] ([facility]ail[facility]       [address]       [place], [place]  [address]       Ph: [phone]       Fax: [phone]   RxID:   [id number]

## 2010-12-31 NOTE — Assessment & Plan Note (Signed)
Summary: Patient is requesting bloodwork, hasn't been seen in a yr. Po...   Vital Signs:  Patient profile:   65 year old female Height:      60.75 inches Weight:      211.8 pounds BMI:     40.50 Pulse rate:   66 / minute Pulse rhythm:   regular BP sitting:   100 / 64  (left arm) Cuff size:   large  Vitals Entered By: Almeta Monas CMA Duncan Dull) (December 09, 2010 10:02 AM) CC: cpx/fasting--no pap   History of Present Illness: Pt here for a cpe and labs.  Pt was in ER 11/05/2010 with abd pain.  She was dx with diverticulitis.  Pt was given flagyl and cipro and pain is gone now.   Pt now c/o nausea.  Pt just found out a friend had a ruptured aneurysm and died.  Pt still sees Dr Tyrone Sage for the aneurym and Dr Clarene Duke for cholesterol and bp etc.  PT Does not want pap today.    Type 1 diabetes mellitus follow-up      This is a 65 year old woman who presents with Type 2 diabetes mellitus follow-up.  The patient denies polyuria, polydipsia, blurred vision, self managed hypoglycemia, hypoglycemia requiring help, weight loss, weight gain, and numbness of extremities.  The patient denies the following symptoms: neuropathic pain, chest pain, vomiting, orthostatic symptoms, poor wound healing, intermittent claudication, vision loss, and foot ulcer.  Since the last visit the patient reports good dietary compliance, compliance with medications, exercising regularly, and monitoring blood glucose.  The patient has been measuring capillary blood glucose before breakfast and after lunch.  Since the last visit, the patient reports having had eye care by an ophthalmologist.    Preventive Screening-Counseling & Management  Alcohol-Tobacco     Alcohol drinks/day: 0     Smoking Status: never  Caffeine-Diet-Exercise     Caffeine use/day: 0     Does Patient Exercise: yes     Type of exercise: walking     Exercise (avg: min/session): 30-60     Times/week: 5  Hep-HIV-STD-Contraception     Dental Visit-last 6  months yes     Dental Care Counseling: not indicated; dental care within six months     SBE monthly: yes     SBE Education/Counseling: not indicated; SBE done regularly     Sun Exposure-Excessive: no  Safety-Violence-Falls     Seat Belt Use: yes      Sexual History:  currently monogamous.        Drug Use:  no.        Blood Transfusions:  yes and after 2001.    Problems Prior to Update: 1)  Diverticulitis, Hx of  (ICD-V12.79) 2)  Other Screening Breast Examination  (ICD-V76.19) 3)  Postmenopausal Status  (ICD-V49.81) 4)  Preventive Health Care  (ICD-V70.0) 5)  Diabetes Mellitus, Type II  (ICD-250.00) 6)  Neck Mass  (ICD-784.2) 7)  Acute Bronchitis  (ICD-466.0) 8)  Cough  (ICD-786.2) 9)  Uri  (ICD-465.9) 10)  Dissecting Aortic Aneurysm Thoracic  (ICD-441.01) 11)  Fasting Hyperglycemia  (ICD-790.29) 12)  Hypertension  (ICD-401.9) 13)  Hyperlipidemia  (ICD-272.4) 14)  Anxiety  (ICD-300.00)  Medications Prior to Update: 1)  Aspirin Buffered 325 Mg Tabs (Aspirin Buff(Mgcarb-Alaminoac)) .... Take 1 Tab Once Daily 2)  Furosemide 20 Mg  Tabs (Furosemide) .... As Needed 3)  Crestor 20 Mg  Tabs (Rosuvastatin Calcium) .[name].. 1 At Bedtime 4)  Xanax 0.5 Mg  Tabs (Alprazolam) .Marland KitchenMarland KitchenMarland Kitchen  1 By Mouth Three Times A Day As Needed 5)  Carvedilol 12.5 Mg  Tabs (Carvedilol) .... Take One Tablet Twice Daily 6)  Citalopram Hydrobromide 20 Mg Tabs (Citalopram Hydrobromide) .[name].. 1 By Mouth Once Daily*office Visit Due Now * 7)  Clonazepam 0.5 Mg Tabs (Clonazepam) .... Take 1 Tab Twice A Day 8)  Azor 10-20 Mg Tabs (Amlodipine-Olmesartan) .... Take 1 Tab Once Daily 9)  Januvia 100 Mg Tabs (Sitagliptin Phosphate) .... Take 1 Tab Once Daily **labs Are Due Now_call For Appt**  Current Medications (verified): 1)  Aspirin Buffered 325 Mg Tabs (Aspirin Buff(Mgcarb-Alaminoac)) .... Take 1 Tab Once Daily 2)  Furosemide 20 Mg  Tabs (Furosemide) .... As Needed 3)  Crestor 20 Mg  Tabs (Rosuvastatin Calcium) .[name].. 1 At  Bedtime 4)  Xanax 0.5 Mg  Tabs (Alprazolam) .[name].. 1 By Mouth Three Times A Day As Needed 5)  Carvedilol 12.5 Mg  Tabs (Carvedilol) .... Take One Tablet Twice Daily 6)  Citalopram Hydrobromide 20 Mg Tabs (Citalopram Hydrobromide) .[name].. 1 By Mouth Once Daily*office Visit Due Now * 7)  Clonazepam 0.5 Mg Tabs (Clonazepam) .... Take 1 Tab Twice A Day 8)  Azor 10-20 Mg Tabs (Amlodipine-Olmesartan) .... Take 1 Tab Once Daily 9)  Januvia 100 Mg Tabs (Sitagliptin Phosphate) .... Take 1 Tab Once Daily **labs Are Due Now_call For Appt** 10)  Norco 5-325 Mg Tabs (Hydrocodone-Acetaminophen) .[name].. 1-2 Tabs Ever 4-6 Hours As Needed Pain 11)  Cipro 500 Mg Tabs (Ciprofloxacin Hcl) .[name].. 1 By Mouth Two Times A Day X10days 12)  Flagyl 500 Mg Tabs (Metronidazole) .[name].. 1 By Mouth Two Times A Day X10 Days  Allergies (verified): No Known Drug Allergies  Past History:  Family History: Last updated: 07/10/2009 no family history  Social History: Last updated: 07/10/2009 Married Never Smoked Alcohol use-no Drug use-no Regular exercise-yes Occupation:  disability  Risk Factors: Alcohol Use: 0 (12/09/2010) Caffeine Use: 0 (12/09/2010) Exercise: yes (12/09/2010)  Risk Factors: Smoking Status: never (12/09/2010)  Social history (including risk factors) reviewed for relevance to current acute and chronic problems.  Past Medical History: Reviewed history from 09/19/2008 and no changes required. Anxiety Hyperlipidemia Hypertension acute aortic dissection 09/02/07 and 2000 Diabetes mellitus, type II  Past Surgical History: Permanent pacemaker (09/13/2006) Abdominal aortic aneurysm repair (2008)  Family History: Reviewed history from 07/10/2009 and no changes required. no family history  Social History: Reviewed history from 07/10/2009 and no changes required. Married Never Smoked Alcohol use-no Drug use-no Regular exercise-yes Occupation:  disability  Review of Systems      See HPI General:   Denies chills, fatigue, fever, loss of appetite, malaise, sleep disorder, sweats, weakness, and weight loss. Eyes:  Denies blurring, discharge, double vision, eye irritation, eye pain, halos, itching, light sensitivity, red eye, vision loss-1 eye, and vision loss-both eyes; fox eye . ENT:  Denies decreased hearing, difficulty swallowing, ear discharge, earache, hoarseness, nasal congestion, nosebleeds, postnasal drainage, ringing in ears, sinus pressure, and sore throat. CV:  Denies bluish discoloration of lips or nails, chest pain or discomfort, difficulty breathing at night, difficulty breathing while lying down, fainting, fatigue, leg cramps with exertion, lightheadness, near fainting, palpitations, shortness of breath with exertion, swelling of feet, swelling of hands, and weight gain. Resp:  Denies chest discomfort, chest pain with inspiration, cough, coughing up blood, excessive snoring, hypersomnolence, morning headaches, pleuritic, shortness of breath, sputum productive, and wheezing. GI:  Denies abdominal pain, bloody stools, change in bowel habits, constipation, dark tarry stools, diarrhea, excessive appetite, gas, hemorrhoids,  indigestion, loss of appetite, nausea, vomiting, vomiting blood, and yellowish skin color. GU:  Denies abnormal vaginal bleeding, decreased libido, discharge, dysuria, genital sores, hematuria, incontinence, nocturia, urinary frequency, and urinary hesitancy. MS:  Denies joint pain, joint redness, joint swelling, loss of strength, low back pain, mid back pain, muscle aches, muscle , cramps, muscle weakness, stiffness, and thoracic pain. Derm:  Denies changes in color of skin, changes in nail beds, dryness, excessive perspiration, flushing, hair loss, insect bite(s), itching, lesion(s), poor wound healing, and rash. Neuro:  Denies brief paralysis, difficulty with concentration, disturbances in coordination, falling down, headaches, inability to speak, memory loss, numbness,  poor balance, seizures, sensation of room spinning, tingling, tremors, visual disturbances, and weakness. Psych:  Denies alternate hallucination ( auditory/visual), anxiety, depression, easily angered, easily tearful, irritability, mental problems, panic attacks, sense of great danger, suicidal thoughts/plans, thoughts of violence, unusual visions or sounds, and thoughts /plans of harming others. Endo:  Denies cold intolerance, excessive hunger, excessive thirst, excessive urination, heat intolerance, polyuria, and weight change. Heme:  Denies abnormal bruising, bleeding, enlarge lymph nodes, fevers, pallor, and skin discoloration. Allergy:  Denies hives or rash, itching eyes, persistent infections, seasonal allergies, and sneezing.  Physical Exam  General:  Well-developed,well-nourished,in no acute distress; alert,appropriate and cooperative throughout examinationoverweight-appearing.   Head:  Normocephalic and atraumatic without obvious abnormalities. No apparent alopecia or balding. Eyes:  pupils equal, pupils round, pupils reactive to light, and no injection.   Ears:  External ear exam shows no significant lesions or deformities.  Otoscopic examination reveals clear canals, tympanic membranes are intact bilaterally without bulging, retraction, inflammation or discharge. Hearing is grossly normal bilaterally. Nose:  External nasal examination shows no deformity or inflammation. Nasal mucosa are pink and moist without lesions or exudates. Mouth:  Oral mucosa and oropharynx without lesions or exudates.  Teeth in good repair. Neck:  No deformities, masses, or tenderness noted.no cervical lymphadenopathy.   Chest Wall:  No deformities, masses, or tenderness noted. Breasts:  No mass, nodules, thickening, tenderness, bulging, retraction, inflamation, nipple discharge or skin changes noted.   Lungs:  Normal respiratory effort, chest expands symmetrically. Lungs are clear to auscultation, no crackles or  wheezes. Heart:  normal rate and Grade  2 /6 systolic ejection murmur.   Abdomen:  Bowel sounds positive,abdomen soft and non-tender without masses, organomegaly or hernias noted. Msk:  normal ROM, no joint tenderness, no joint swelling, no joint warmth, no redness over joints, no joint deformities, no joint instability, and no crepitation.   Pulses:  R and L carotid,radial,femoral,dorsalis pedis and posterior tibial pulses are full and equal bilaterally Extremities:  No clubbing, cyanosis, edema, or deformity noted with normal full range of motion of all joints.   Neurologic:  alert & oriented X3, strength normal in all extremities, gait normal, and DTRs symmetrical and normal.   Skin:  Intact without suspicious lesions or rashes Cervical Nodes:  No lymphadenopathy noted Axillary Nodes:  No palpable lymphadenopathy Psych:  Cognition and judgment appear intact. Alert and cooperative with normal attention span and concentration. No apparent delusions, illusions, hallucinations  Diabetes Management Exam:    Foot Exam (with socks and/or shoes not present):       Sensory-Pinprick/Light touch:          Left medial foot (L-4): normal          Left dorsal foot (L-5): normal          Left lateral foot (S-1): normal  Right medial foot (L-4): normal          Right dorsal foot (L-5): normal          Right lateral foot (S-1): normal       Sensory-Monofilament:          Left foot: normal          Right foot: normal       Inspection:          Left foot: normal          Right foot: normal       Nails:          Left foot: normal          Right foot: normal    Eye Exam:       Eye Exam done elsewhere          Date: 10/14/2010          Results: normal          Done by: Fox   Impression & Recommendations:  Problem # 1:  PREVENTIVE HEALTH CARE (ICD-V70.0)  Orders: Venipuncture (04540) TLB-Lipid Panel (80061-LIPID) TLB-BMP (Basic Metabolic Panel-BMET) (80048-METABOL) TLB-CBC Platelet -  w/Differential (85025-CBCD) TLB-Hepatic/Liver Function Pnl (80076-HEPATIC) TLB-TSH (Thyroid Stimulating Hormone) (84443-TSH) TLB-A1C / Hgb A1C (Glycohemoglobin) (83036-A1C) TLB-Microalbumin/Creat Ratio, Urine (82043-MALB) T-Vitamin D (25-Hydroxy) (98119-14782) Specimen Handling (95621) UA Dipstick W/ Micro (manual) (81000) EKG w/ Interpretation (93000)  Problem # 2:  DIVERTICULITIS, HX OF (ICD-V12.79) Assessment: Improved  Orders: Gastroenterology Referral (GI) EKG w/ Interpretation (93000)  Problem # 3:  POSTMENOPAUSAL STATUS (ICD-V49.81)  Orders: Venipuncture (30865) TLB-Lipid Panel (80061-LIPID) TLB-BMP (Basic Metabolic Panel-BMET) (80048-METABOL) TLB-CBC Platelet - w/Differential (85025-CBCD) TLB-Hepatic/Liver Function Pnl (80076-HEPATIC) TLB-TSH (Thyroid Stimulating Hormone) (84443-TSH) TLB-A1C / Hgb A1C (Glycohemoglobin) (83036-A1C) TLB-Microalbumin/Creat Ratio, Urine (82043-MALB) T-Vitamin D (25-Hydroxy) (78469-62952) Specimen Handling (84132) EKG w/ Interpretation (93000)  Problem # 4:  DIABETES MELLITUS, TYPE II (ICD-250.00)  Her updated medication list for this problem includes:    Aspirin Buffered 325 Mg Tabs (Aspirin buff(mgcarb-alaminoac)) .[name]... Take 1 tab once daily    Azor 10-20 Mg Tabs (Amlodipine-olmesartan) .[name]... Take 1 tab once daily    Januvia 100 Mg Tabs (Sitagliptin phosphate) .[name]... Take 1 tab once daily **labs are due now_call for appt**  Orders: Venipuncture (44010) TLB-Lipid Panel (80061-LIPID) TLB-BMP (Basic Metabolic Panel-BMET) (80048-METABOL) TLB-CBC Platelet - w/Differential (85025-CBCD) TLB-Hepatic/Liver Function Pnl (80076-HEPATIC) TLB-TSH (Thyroid Stimulating Hormone) (84443-TSH) TLB-A1C / Hgb A1C (Glycohemoglobin) (83036-A1C) TLB-Microalbumin/Creat Ratio, Urine (82043-MALB) T-Vitamin D (25-Hydroxy) (27253-66440) Specimen Handling (34742) EKG w/ Interpretation (93000)  Problem # 5:  HYPERTENSION (ICD-401.9)  Her updated medication  list for this problem includes:    Furosemide 20 Mg Tabs (Furosemide) .[name]... As needed    Carvedilol 12.5 Mg Tabs (Carvedilol) .[name]... Take one tablet twice daily    Azor 10-20 Mg Tabs (Amlodipine-olmesartan) .[name]... Take 1 tab once daily  Orders: Venipuncture (59563) TLB-Lipid Panel (80061-LIPID) TLB-BMP (Basic Metabolic Panel-BMET) (80048-METABOL) TLB-CBC Platelet - w/Differential (85025-CBCD) TLB-Hepatic/Liver Function Pnl (80076-HEPATIC) TLB-TSH (Thyroid Stimulating Hormone) (84443-TSH) TLB-A1C / Hgb A1C (Glycohemoglobin) (83036-A1C) TLB-Microalbumin/Creat Ratio, Urine (82043-MALB) T-Vitamin D (25-Hydroxy) (87564-33295) Specimen Handling (18841) EKG w/ Interpretation (93000)  BP today: 100/64 Prior BP: 130/80 (12/26/2009)  Labs Reviewed: K+: 4.6 (07/10/2009) Creat: : 0.9 (07/10/2009)   Chol: 159 (07/10/2009)   HDL: 50.50 (07/10/2009)   LDL: 84 (07/10/2009)   TG: 121.0 (07/10/2009)  Problem # 6:  HYPERLIPIDEMIA (ICD-272.4)  Her updated medication list for this problem includes:    Crestor 20 Mg Tabs (Rosuvastatin  calcium) .[name]... 1 at bedtime  Orders: Venipuncture (64403) TLB-Lipid Panel (80061-LIPID) TLB-BMP (Basic Metabolic Panel-BMET) (80048-METABOL) TLB-CBC Platelet - w/Differential (85025-CBCD) TLB-Hepatic/Liver Function Pnl (80076-HEPATIC) TLB-TSH (Thyroid Stimulating Hormone) (84443-TSH) TLB-A1C / Hgb A1C (Glycohemoglobin) (83036-A1C) TLB-Microalbumin/Creat Ratio, Urine (82043-MALB) T-Vitamin D (25-Hydroxy) (47425-95638) Specimen Handling (75643) EKG w/ Interpretation (93000)  Labs Reviewed: SGOT: 20 (07/10/2009)   SGPT: 17 (07/10/2009)   HDL:50.50 (07/10/2009), 39.1 (09/24/2008)  LDL:84 (07/10/2009), 79 (09/24/2008)  Chol:159 (07/10/2009), 139 (09/24/2008)  Trig:121.0 (07/10/2009), 103 (09/24/2008)  Problem # 7:  ANXIETY (ICD-300.00)  Her updated medication list for this problem includes:    Xanax 0.5 Mg Tabs (Alprazolam) .[name]... 1 by mouth three times a day as  needed    Citalopram Hydrobromide 20 Mg Tabs (Citalopram hydrobromide) .[name]... 1 by mouth once daily*office visit due now *    Clonazepam 0.5 Mg Tabs (Clonazepam) .[name]... Take 1 tab twice a day  Discussed medication use and relaxation techniques.   Orders: EKG w/ Interpretation (93000)  Complete Medication List: 1)  Aspirin Buffered 325 Mg Tabs (Aspirin buff(mgcarb-alaminoac)) .... Take 1 tab once daily 2)  Furosemide 20 Mg Tabs (Furosemide) .... As needed 3)  Crestor 20 Mg Tabs (Rosuvastatin calcium) .[name].. 1 at bedtime 4)  Xanax 0.5 Mg Tabs (Alprazolam) .[name].. 1 by mouth three times a day as needed 5)  Carvedilol 12.5 Mg Tabs (Carvedilol) .... Take one tablet twice daily 6)  Citalopram Hydrobromide 20 Mg Tabs (Citalopram hydrobromide) .[name].. 1 by mouth once daily*office visit due now * 7)  Clonazepam 0.5 Mg Tabs (Clonazepam) .... Take 1 tab twice a day 8)  Azor 10-20 Mg Tabs (Amlodipine-olmesartan) .... Take 1 tab once daily 9)  Januvia 100 Mg Tabs (Sitagliptin phosphate) .... Take 1 tab once daily **labs are due now_call for appt** 10)  Norco 5-325 Mg Tabs (Hydrocodone-acetaminophen) .[name].. 1-2 tabs ever 4-6 hours as needed pain 11)  Cipro 500 Mg Tabs (Ciprofloxacin hcl) .[name].. 1 by mouth two times a day x10days 12)  Flagyl 500 Mg Tabs (Metronidazole) .[name].. 1 by mouth two times a day x10 days  Other Orders: Zoster (Shingles) Vaccine Live [phone]) Admin 1st Vaccine (88416)  Patient Instructions: 1)  Please schedule a follow-up appointment in 6 months .  Prescriptions: JANUVIA 100 MG TABS (SITAGLIPTIN PHOSPHATE) TAKE 1 TAB once daily **LABS ARE DUE NOW_CALL FOR APPT**  #30 x 5   Entered and Authorized by:   Loreen Freud DO   Signed by:   Loreen Freud DO on 12/09/2010   Method used:   Electronically to        [facility] (retail)       [address]       [place], [place]  [address]       Ph: [phone]       Fax: [phone]   RxID:    [id number] CITALOPRAM HYDROBROMIDE 20 MG TABS (CITALOPRAM HYDROBROMIDE) 1 by mouth once daily*OFFICE VISIT DUE NOW *  #90 x 3   Entered and Authorized by:   Loreen Freud DO   Signed by:   Loreen Freud DO on 12/09/2010   Method used:   Electronically to        [facility] (retail)       [address]       [place], [place]  [address]       Ph: [phone]       Fax:  [phone]   RxID:   [id number] XANAX 0.5 MG  TABS (ALPRAZOLAM) 1 by mouth three times a day as needed  #90 x 1   Entered and Authorized by:   Loreen Freud DO   Signed by:   Loreen Freud DO on 12/09/2010   Method used:   Print then Give to Patient   RxID:   [id number]    Orders Added: 1)  Venipuncture [41324] 2)  TLB-Lipid Panel [80061-LIPID] 3)  TLB-BMP (Basic Metabolic Panel-BMET) [80048-METABOL] 4)  TLB-CBC Platelet - w/Differential [85025-CBCD] 5)  TLB-Hepatic/Liver Function Pnl [80076-HEPATIC] 6)  TLB-TSH (Thyroid Stimulating Hormone) [84443-TSH] 7)  TLB-A1C / Hgb A1C (Glycohemoglobin) [83036-A1C] 8)  TLB-Microalbumin/Creat Ratio, Urine [82043-MALB] 9)  T-Vitamin D (25-Hydroxy) [40102-72536] 10)  Zoster (Shingles) Vaccine Live [90736] 11)  Admin 1st Vaccine [90471] 12)  Specimen Handling [99000] 13)  UA Dipstick W/ Micro (manual) [81000] 14)  Gastroenterology Referral [GI] 15)  Est. Patient 40-64 years [99396] 16)  EKG w/ Interpretation [93000]   Immunizations Administered:  Zostavax # 1:    Vaccine Type: Zostavax    Site: left deltoid    Mfr: Merck    Dose: 0.5 ml    Route: [place]    Given by: Almeta Monas CMA (AAMA)    Exp. Date: 09/30/2011    Lot #: [id number]    VIS given: 09/10/05 given December 09, 2010.   Immunizations Administered:  Zostavax # 1:    Vaccine Type: Zostavax    Site: left deltoid    Mfr: Merck    Dose: 0.5 ml    Route: [place]    Given by: Almeta Monas CMA (AAMA)    Exp. Date: 09/30/2011    Lot #: [id number]    VIS given:  09/10/05 given December 09, 2010.  Mammogram Result Date:  02/11/2010 Mammogram Result:  normal Mammogram Next Due:  1 yr     Laboratory Results   Urine Tests   Date/Time Reported: December 09, 2010 1:35 PM   Routine Urinalysis   Color: yellow Appearance: Clear Glucose: negative   (Normal Range: Negative) Bilirubin: negative   (Normal Range: Negative) Ketone: negative   (Normal Range: Negative) Spec. Gravity: 1.020   (Normal Range: 1.003-1.035) Blood: negative   (Normal Range: Negative) pH: 5.0   (Normal Range: 5.0-8.0) Protein: negative   (Normal Range: Negative) Urobilinogen: negative   (Normal Range: 0-1) Nitrite: negative   (Normal Range: Negative) Leukocyte Esterace: negative   (Normal Range: Negative)    Comments: Floydene Flock  December 09, 2010 1:35 PM

## 2010-12-31 NOTE — Progress Notes (Signed)
Summary: Results lmom- 1/17  Phone Note Outgoing Call   Call placed by: Almeta Monas CMA Duncan Dull),  December 15, 2010 11:36 AM Call placed to: Patient Details for Reason: low vita D----take vita D 50000 u weekly and take vita D 3 2000u daily ( OTC) recheck 3 months Summary of Call: Left message to call back on home #... Almeta Monas CMA Duncan Dull)  December 15, 2010 11:36 AM   Follow-up for Phone Call        spoke w/ patient aware of labs and prescription sent to pharmacy ...Marland KitchenMarland KitchenDoristine Devoid CMA  December 15, 2010 1:50 PM     New/Updated Medications: VITAMIN D (ERGOCALCIFEROL) 50000 UNIT CAPS (ERGOCALCIFEROL) take one tablet weekly Prescriptions: VITAMIN D (ERGOCALCIFEROL) 50000 UNIT CAPS (ERGOCALCIFEROL) take one tablet weekly  #12 x 0   Entered by:   Doristine Devoid CMA   Authorized by:   Neena Rhymes MD   Signed by:   Doristine Devoid CMA on 12/15/2010   Method used:   Electronically to        [facility] (retail)       [address]       [place], [place]  [address]       Ph: [phone]       Fax: [phone]   RxID:   [id number]

## 2010-12-31 NOTE — Letter (Signed)
Summary: New Patient letter  [facility]  [address] [place], [place] [address]   Phone: [phone]  Fax: [phone]       12/09/2010 MRN: [id number]  [facility] [address] [place], [place]  [address]  Dear Ms. FRUGE,  Welcome to the Gastroenterology Division at [facility].    You are scheduled to see Dr.  Sheryn Bison on January 15, 2011 at 9:00am on the 3rd floor at [facility], [address]  We ask that you try to arrive at our office 15 minutes prior to your appointment time to allow for check-in.  We would like you to complete the enclosed self-administered evaluation form prior to your visit and bring it with you on the day of your appointment.  We will review it with you.  Also, please bring a complete list of all your medications or, if you prefer, bring the medication bottles and we will list them.  Please bring your insurance card so that we may make a copy of it.  If your insurance requires a referral to see a specialist, please bring your referral form from your primary care physician.  Co-payments are due at the time of your visit and may be paid by cash, check or credit card.     Your office visit will consist of a consult with your physician (includes a physical exam), any laboratory testing he/she may order, scheduling of any necessary diagnostic testing (e.g. x-ray, ultrasound, CT-scan), and scheduling of a procedure (e.g. Endoscopy, Colonoscopy) if required.  Please allow enough time on your schedule to allow for any/all of these possibilities.    If you cannot keep your appointment, please call [phone] to cancel or reschedule prior to your appointment date.  This allows [name] the opportunity to schedule an appointment for another patient in need of care.  If you do not cancel or reschedule by 5 p.m. the business day prior to your appointment date, you will be charged a $50.00 late cancellation/no-show fee.    Thank  you for choosing [facility] for your medical needs.  We appreciate the opportunity to care for you.  Please visit [name] at our website  to learn more about our practice.                     Sincerely,                                                             The Gastroenterology Division

## 2010-12-31 NOTE — Progress Notes (Signed)
Summary: REFILL  Phone Note Refill Request Call back at [phone] Message from:  Pharmacy on December 01, 2010 11:43 AM  Refills Requested: Medication #1:  XANAX 0.5 MG  TABS 1 by mouth three times a day as needed   Dosage confirmed as above?Dosage Confirmed   Supply Requested: 3 months [facility]  Next Appointment Scheduled: NONE Initial call taken by: Lavell Islam,  December 01, 2010 11:43 AM  Follow-up for Phone Call        last filled 11/05/10 and seen 12/26/09...[name] please advise Follow-up by: Almeta Monas CMA Duncan Dull),  December 01, 2010 2:09 PM  Additional Follow-up for Phone Call Additional follow up Details #1::        refill x1--- pt due for ov this month Additional Follow-up by: Loreen Freud DO,  December 01, 2010 2:14 PM    Prescriptions: XANAX 0.5 MG  TABS (ALPRAZOLAM) 1 by mouth three times a day as needed  #90 x 0   Entered by:   Almeta Monas CMA (AAMA)   Authorized by:   Loreen Freud DO   Signed by:   Almeta Monas CMA (AAMA) on 12/01/2010   Method used:   Printed then faxed to ...       [facility] (retail)       [address]       [place], [place]  [address]       Ph: [phone]       Fax: [phone]   RxID:   [id number]

## 2010-12-31 NOTE — Letter (Signed)
Summary: Primary Care Appointment Letter  [facility]  [address] [place], [place] [address]   Phone: [phone]  Fax: [phone]    12/01/2010 MRN: [id number]  [employer] [employer] [address] [place], [place]  [address]  Dear Ms. [name],   Your Primary Care Physician Loreen Freud DO has indicated that:    __X_____it is time to schedule an appointment for a physical exam with fasting labs.    _______you missed your appointment on______ and need to call and          reschedule.    _______you need to have lab work done.    _______you need to schedule an appointment discuss lab or test results.    _______you need to call to reschedule your appointment that is                       scheduled on _________.     Please call our office as soon as possible. Our phone number is [phone]. Please press option 1. Our office is open 8a-5p, Monday through Friday.     Thank you,    [facility]

## 2011-01-15 ENCOUNTER — Encounter: Payer: Self-pay | Admitting: Gastroenterology

## 2011-01-15 ENCOUNTER — Encounter (INDEPENDENT_AMBULATORY_CARE_PROVIDER_SITE_OTHER): Payer: Self-pay | Admitting: *Deleted

## 2011-01-15 ENCOUNTER — Ambulatory Visit (INDEPENDENT_AMBULATORY_CARE_PROVIDER_SITE_OTHER): Payer: PRIVATE HEALTH INSURANCE | Admitting: Gastroenterology

## 2011-01-15 DIAGNOSIS — K219 Gastro-esophageal reflux disease without esophagitis: Secondary | ICD-10-CM

## 2011-01-15 DIAGNOSIS — K649 Unspecified hemorrhoids: Secondary | ICD-10-CM | POA: Insufficient documentation

## 2011-01-15 DIAGNOSIS — R110 Nausea: Secondary | ICD-10-CM | POA: Insufficient documentation

## 2011-01-15 DIAGNOSIS — K5730 Diverticulosis of large intestine without perforation or abscess without bleeding: Secondary | ICD-10-CM

## 2011-01-20 NOTE — Assessment & Plan Note (Addendum)
Summary: LOOSE STOOL.Marland KitchenMarland KitchenEM  SCH'D W/RENEE//MEDCOST//INS COPAY AND CX FE...   History of Present Illness Visit Type: Initial Consult Primary GI MD: Sheryn Bison MD FACP FAGA Primary Provider: Loreen Freud, DO Requesting Provider: Loreen Freud, DO Chief Complaint: LLQ abd pain, nausea, hemorrhoids, and GERD History of Present Illness:   Very Pleasant 65 year old white female referred through the courtesy of Dr. Loreen Freud for evaluation of a recent episode of left lower quadrant pain and abnormal CT scan consistent with diverticulitis.  [name] has regular bowel movements and hasno GI complaints except for occasional acid reflux manageed with over-the-counter Prevacid. She had the sudden onset of left lower quadrant pain in early December, was seen in the emergency room,and treated with Cipro And Metronidazole after CT scan showed uncomplicated sigmoid colon diverticulitis. She currently is asymptomatic except for occasional left lower quadrant spasmodic type pain. She has  had no melena or hematochezia, nausea vomiting, fever, chills, genitourinary complaints, gynecologic issues, et [name].  She had a negative colonoscopy in 2005 by Dr. Achilles Dunk at [facility] in [place]. 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 Sheliah Plane. Her cardiologist is Dr. Julieanne Manson, and she denies any current cardiovascular symptomatology. Other problems have included osteoporosis, adult onset diabetes, and chronic depression. She also has a history of hypertension, kidney stones, hyperlipidemia, and previous skin cancers.  Surgical procedures in addition to her aortic disection repairs include appendectomy, hysterectomy, and pacemaker implantation. She currently does not smoke, use ethanol, or NSAIDs. She does take aspirin 325 mg a day. She follows a regular diet and denies any food intolerances.   GI Review of Systems    Reports abdominal pain, acid  reflux, heartburn, and  nausea.     Location of  Abdominal pain: LLQ.    Denies belching, bloating, chest pain, dysphagia with liquids, dysphagia with solids, loss of appetite, vomiting, vomiting blood, weight loss, and  weight gain.      Reports hemorrhoids.     Denies anal fissure, black tarry stools, change in bowel habit, constipation, diarrhea, diverticulosis, fecal incontinence, heme positive stool, irritable bowel syndrome, jaundice, light color stool, liver problems, rectal bleeding, and  rectal pain.    Current Medications (verified): 1)  Aspirin Buffered 325 Mg Tabs (Aspirin Buff(Mgcarb-Alaminoac)) .... Take 1 Tab Once Daily 2)  Furosemide 20 Mg  Tabs (Furosemide) .... As Needed 3)  Crestor 20 Mg  Tabs (Rosuvastatin Calcium) .[name].. 1 At Bedtime 4)  Xanax 0.5 Mg  Tabs (Alprazolam) .[name].. 1 By Mouth Two Times A Day As Needed 5)  Carvedilol 12.5 Mg  Tabs (Carvedilol) .... Take One Tablet Twice Daily 6)  Citalopram Hydrobromide 20 Mg Tabs (Citalopram Hydrobromide) .[name].. 1 By Mouth Once Daily*office Visit Due Now * 7)  Clonazepam 0.5 Mg Tabs (Clonazepam) .... Take 1 Tab Twice A Day 8)  Azor 10-20 Mg Tabs (Amlodipine-Olmesartan) .... Take 1 Tab Once Daily 9)  Januvia 100 Mg Tabs (Sitagliptin Phosphate) .... Take 1 Tab Once Daily **labs Are Due Now_call For Appt** 10)  Vitamin D (Ergocalciferol) 50000 Unit Caps (Ergocalciferol) .... Take One Tablet Weekly 11)  Prevacid 24hr 15 Mg Cpdr (Lansoprazole) .... As Needed  Allergies (verified): 1)  ! Sulfa  Past History:  Past medical, surgical, family and social histories (including risk factors) reviewed for relevance to current acute and chronic problems.  Past Medical History: Anxiety Hyperlipidemia Hypertension acute aortic dissection 09/02/07 and 2000 Diabetes mellitus, type II Diverticulitis GERD Hemorrhoids Kidney Stones  Skin Cancer--Groin   Past Surgical History: Permanent pacemaker (09/13/2006) Abdominal aortic aneurysm  repair (2008) Appendectomy Hysterectomy  Family History: Reviewed history from 07/10/2009 and no changes required. No FH of Colon Cancer: Family History of Heart Disease: Father--MI   Social History: Reviewed history from 07/10/2009 and no changes required. Disbiility--Retired Married Childern Alcohol use-no Drug use-no Regular exercise-yes Patient is a former smoker: 30 yrs ago  Smoking Status:  quit  Review of Systems       The patient complains of anxiety-new, back pain, depression-new, and swelling of feet/legs.  The patient denies allergy/sinus, anemia, arthritis/joint pain, blood in urine, breast changes/lumps, change in vision, confusion, cough, coughing up blood, fainting, fatigue, fever, headaches-new, hearing problems, heart murmur, heart rhythm changes, itching, menstrual pain, muscle pains/cramps, night sweats, nosebleeds, pregnancy symptoms, shortness of breath, skin rash, sleeping problems, sore throat, swollen lymph glands, thirst - excessive , urination - excessive , urination changes/pain, urine leakage, vision changes, and voice change.    Vital Signs:  Patient profile:   65 year old female Height:      60.75 inches Weight:      209 pounds BMI:     39.96 BSA:     1.92 Pulse rate:   88 / minute Pulse rhythm:   regular BP sitting:   110 / 64  (left arm) Cuff size:   large  Vitals Entered By: Ok Anis CMA (January 15, 2011 8:51 AM)  Physical Exam  General:  Well developed, well nourished, no acute distress.obese.   Head:  Normocephalic and atraumatic. Eyes:  PERRLA, no icterus. Neck:  Supple; no masses or thyromegaly. Lungs:  Clear throughout to auscultation. Heart:  Regular rate and rhythm; no murmurs, rubs,  or bruits. Abdomen:  Soft, nontender and nondistended. No masses, hepatosplenomegaly or hernias noted. Normal bowel sounds.obese.   Rectal:  deferred Msk:  Symmetrical with no gross deformities. Normal posture. Extremities:  No clubbing,  cyanosis, edema or deformities noted. Neurologic:  Alert and  oriented x4;  grossly normal neurologically. Cervical Nodes:  No significant cervical adenopathy. Psych:  Alert and cooperative. Normal mood and affect.   Impression & Recommendations:  Problem # 1:  DIVERTICULITIS, HX OF (ICD-V12.79) Assessment Improved Long discussion concerning diverticulosis and its management. I placed her on a high fiber diet with daily Benefiber p.r.n. sublingual Levsin. I do not think repeat colonoscopy indicated at this time per negative colonoscopy in 2005. I have asked her to return stool cards for human hemoglobin. Also I prescribed a two-week course of Florstar probiotic therapy. She is to call us if she has recurrence of her diverticulosis-diverticulitis problems. I also will send this letter to her cardiologist per his recommendations for possible colonoscopy if needed. Obviously she is at increased risk for any procedures. She is not on Plavix or Coumadin  Problem # 2:  GERD (ICD-530.81) Assessment: Unchanged p.r.n. over-the-counter Prevacid seems to be adequate for her symptomatology.  Problem # 3:  DISSECTING AORTIC ANEURYSM THORACIC (ICD-441.01) Assessment: Improved Continued cardiovascular-thoracic surgery followup as planned  Problem # 4:  HYPERTENSION (ICD-401.9) Assessment: Improved blood pressure today normal at 110/64 HEENT she is to continue other medications as listed.  Problem # 5:  DIABETES MELLITUS, TYPE II (ICD-250.00) Assessment: Improved continue medications per Dr. Laury Axon.  Problem # 6:  ANXIETY (ICD-300.00) Assessment: Improved continue citalopram 20 mg a day, and she also is on clonazepam 0.5 mg b.i.d.  Patient Instructions: 1)  Copy sent to : Loreen Freud, DO and Dr. Gaspar Garbe  Little in cardiology and Dr. Sheliah Plane, thoracic surgery. 2)  Today you watched the movie on diverticular disease.  3)  Diet should be high in fiber ( fruits, vegetables, whole grains) but low  in residue. Drink at least eight (8) glasses of water a day.  4)  Diverticular Disease brochure given.  5)  Take Florastor for two weeks.  6)  Your prescription(s) have been sent to you pharmacy.  7)  Please go to the basement today for your labs.  8)  The medication list was reviewed and reconciled.  All changed / newly prescribed medications were explained.  A complete medication list was provided to the patient / caregiver. 9)  Stool IFOB cards requested. Prescriptions: LEVSIN 0.125 MG TABS (HYOSCYAMINE SULFATE) take one by mouth two times a day as needed  #60 x 3   Entered by:   Harlow Mares CMA (AAMA)   Authorized by:   Mardella Layman MD [facility]   Signed by:   Harlow Mares CMA (AAMA) on 01/15/2011   Method used:   Electronically to        [facility] (retail)       [address]       [place], [place]  [address]       Ph: [phone]       Fax: [phone]   RxID:   [id number]

## 2011-01-20 NOTE — Letter (Signed)
Summary: [facility]: Immunoassay Fecal Occult Blood (iFOB) Order Form  [facility]  [address] [place], [place] [address]   Phone: [phone]  Fax: [phone]      [facility]: Immunoassay Fecal Occult Blood (iFOB) Order Form   January 15, 2011 MRN: [id number]   [name] 03-31-1946   Physicican Name:Patterson  Diagnosis Code:562.10/530.81      Harlow Mares CMA (AAMA)

## 2011-01-27 ENCOUNTER — Other Ambulatory Visit: Payer: PRIVATE HEALTH INSURANCE

## 2011-01-27 ENCOUNTER — Encounter (INDEPENDENT_AMBULATORY_CARE_PROVIDER_SITE_OTHER): Payer: Self-pay | Admitting: *Deleted

## 2011-01-27 ENCOUNTER — Other Ambulatory Visit: Payer: Self-pay | Admitting: Gastroenterology

## 2011-01-27 DIAGNOSIS — K219 Gastro-esophageal reflux disease without esophagitis: Secondary | ICD-10-CM

## 2011-01-27 DIAGNOSIS — K5730 Diverticulosis of large intestine without perforation or abscess without bleeding: Secondary | ICD-10-CM

## 2011-02-09 LAB — BASIC METABOLIC PANEL
CO2: 23 mEq/L (ref 19–32)
Calcium: 9.4 mg/dL (ref 8.4–10.5)
Creatinine, Ser: 0.8 mg/dL (ref 0.4–1.2)
GFR calc Af Amer: >60 mL/min (ref >60)
GFR calc non Af Amer: >60 mL/min (ref >60)
Glucose, Bld: 141 mg/dL — ABNORMAL HIGH (ref 70–99)
Sodium: 146 mEq/L — ABNORMAL HIGH (ref 135–145)

## 2011-02-09 LAB — URINALYSIS, ROUTINE W REFLEX MICROSCOPIC
Hgb urine dipstick: NEGATIVE
Nitrite: NEGATIVE
Protein, ur: NEGATIVE mg/dL
Specific Gravity, Urine: 1.022 (ref 1.005–1.030)
Urobilinogen, UA: 1 mg/dL (ref 0.0–1.0)

## 2011-02-09 LAB — DIFFERENTIAL
Basophils Absolute: 0 10*3/uL (ref 0.0–0.1)
Basophils Relative: 0 % (ref 0–1)
Eosinophils Absolute: 0.1 10*3/uL (ref 0.0–0.7)
Neutro Abs: 6.3 10*3/uL (ref 1.7–7.7)
Neutrophils Relative %: 72 % (ref 43–77)

## 2011-02-09 LAB — CBC
MCH: 32 pg (ref 26.0–34.0)
MCHC: 34.5 g/dL (ref 30.0–36.0)
Platelets: 153 10*3/uL (ref 150–400)
RDW: 13.5 % (ref 11.5–15.5)

## 2011-02-09 LAB — URINE MICROSCOPIC-ADD ON

## 2011-02-11 ENCOUNTER — Other Ambulatory Visit: Payer: Self-pay | Admitting: Cardiothoracic Surgery

## 2011-02-11 DIAGNOSIS — I71019 Dissection of thoracic aorta, unspecified: Secondary | ICD-10-CM

## 2011-02-11 DIAGNOSIS — I7101 Dissection of thoracic aorta: Secondary | ICD-10-CM

## 2011-02-23 ENCOUNTER — Other Ambulatory Visit: Payer: Self-pay | Admitting: Family Medicine

## 2011-02-23 MED ORDER — ALPRAZOLAM 0.5 MG PO TABS: 0.5000 mg | ORAL_TABLET | Freq: Three times a day (TID) | ORAL | Status: DC | PRN

## 2011-02-23 NOTE — Telephone Encounter (Signed)
Last filled 12-09-10 #90 1, last ov  12-09-10.Felecia Rowen Wilmer CMA

## 2011-02-24 ENCOUNTER — Other Ambulatory Visit: Payer: Self-pay | Admitting: Family Medicine

## 2011-02-24 MED ORDER — ALPRAZOLAM 0.5 MG PO TABS: 0.5000 mg | ORAL_TABLET | Freq: Three times a day (TID) | ORAL | Status: DC | PRN

## 2011-02-24 NOTE — Telephone Encounter (Signed)
Last seen 12/09/10 and filled 11/19/10 with 2 refills --Please advise      KP

## 2011-02-24 NOTE — Telephone Encounter (Signed)
[facility] [address].Felecia Timmie Dugue CMA

## 2011-02-25 MED ORDER — CLONAZEPAM 0.5 MG PO TABS: 0.5000 mg | ORAL_TABLET | Freq: Two times a day (BID) | ORAL | Status: DC

## 2011-02-25 NOTE — Telephone Encounter (Signed)
Rx faxed to pharmacy.      KP 

## 2011-03-16 ENCOUNTER — Other Ambulatory Visit: Payer: Self-pay | Admitting: Family Medicine

## 2011-03-16 NOTE — Telephone Encounter (Signed)
I spoke with [facility] and confirmed that this was requested in error. Rf x5 sent in Jan 2012 was received.

## 2011-03-17 ENCOUNTER — Other Ambulatory Visit: Payer: Self-pay

## 2011-03-17 MED ORDER — ALPRAZOLAM 0.5 MG PO TABS: 0.5000 mg | ORAL_TABLET | Freq: Three times a day (TID) | ORAL | Status: DC | PRN

## 2011-03-17 NOTE — Telephone Encounter (Signed)
Faxed.   KP 

## 2011-03-17 NOTE — Telephone Encounter (Signed)
Rx faxed

## 2011-03-17 NOTE — Telephone Encounter (Signed)
Last filled 02/24/11 and seen 12/09/10 please advise    KP

## 2011-03-25 ENCOUNTER — Ambulatory Visit
Admission: RE | Admit: 2011-03-25 | Discharge: 2011-03-25 | Disposition: A | Discharge status: Home / Self Care | Payer: PRIVATE HEALTH INSURANCE | Source: Ambulatory Visit | Attending: Cardiothoracic Surgery | Admitting: Cardiothoracic Surgery

## 2011-03-25 ENCOUNTER — Ambulatory Visit (INDEPENDENT_AMBULATORY_CARE_PROVIDER_SITE_OTHER): Payer: PRIVATE HEALTH INSURANCE | Admitting: Cardiothoracic Surgery

## 2011-03-25 DIAGNOSIS — I7101 Dissection of thoracic aorta: Secondary | ICD-10-CM

## 2011-03-25 DIAGNOSIS — I71019 Dissection of thoracic aorta, unspecified: Secondary | ICD-10-CM

## 2011-03-25 MED ORDER — IOHEXOL 350 MG/ML SOLN
100.0000 mL | Freq: Once | INTRAVENOUS | Status: AC | PRN
  Administered 2011-03-25: 100 mL via INTRAVENOUS

## 2011-03-26 NOTE — Assessment & Plan Note (Signed)
OFFICE VISIT  [name], [name] DOB:  1946-10-27                                        March 25, 2011 CHART #:  [id number]  The patient returns to office today in followup after her original repair of acute type A aortic dissection with replacement of the ascending aorta and resuspension of the aortic valve with circulatory arrest on September 02, 2007.  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  She returns today for a followup CTA of the chest. Last year, she had a bout of diverticulitis, treated with antibiotics, presenting primarily as left lower lobe abdominal pain and is followed by Dr. Jarold Motto for this.  She notes that she has been diligent about taking her blood pressure medication.  On exam today in her right arm, her blood pressure is 146/80, she did not take her blood pressure medicine this morning prior to her scan because she notes that it causes nausea on an empty stomach.  She notes usually her blood pressures are running 117-120 over 60s, pulse is 88, respiratory rate 16, O2 sats 94%.  Her lungs are clear, I do not appreciate carotid bruits.  Her sternum is stable and well healed.  She has early systolic ejection murmur 2/6, I do not appreciate a murmur of aortic insufficiency.  She has no pedal edema.  She has palpable brachial and radial pulses bilaterally.  She has no pedal edema.  CTA of the aorta was done today and previous scans have showed a persistent dissection, a celiac axis and SMA, and the right renal artery originated from the true lumen and the left renal artery from the opacified false lumen.  On the lung windows, there are no parenchymal abnormalities.  Overall, CT scan appears stable from previous years.  PLAN:  To see her back with a repeat CTA in 1 year, again reviewed with her and stressed the importance of continued good blood  pressure control.  She does not smoke.  Sheliah Plane, MD Electronically Signed  EG/MEDQ  D:  03/25/2011  T:  03/25/2011  Job:  [id number]  cc:   Thereasa Solo. Little, M.D. Lelon Perla, DO

## 2011-03-29 ENCOUNTER — Telehealth: Payer: Self-pay | Admitting: Family Medicine

## 2011-03-29 MED ORDER — ALPRAZOLAM 0.5 MG PO TABS: 0.5000 mg | ORAL_TABLET | Freq: Three times a day (TID) | ORAL | Status: DC | PRN

## 2011-03-29 NOTE — Telephone Encounter (Signed)
Spoke with patient and advised this was an error and I will go ahead and print the 60 she did not receive and fax it to the pharmacy, she voiced understanding. I apologized for the mix up, Rx printed and faxed to [facility]      KP

## 2011-03-29 NOTE — Telephone Encounter (Signed)
Patient called to report that she only got 10 days worth of Alprazolam---wants to know WHY??      [facility]ses [facility] [address], ----leaving for beach on Tuesday or Wednesday---says she doesn't remember talking to Dr Laury Axon about cutting back on her meds

## 2011-03-30 ENCOUNTER — Other Ambulatory Visit: Payer: Self-pay

## 2011-03-30 MED ORDER — CLONAZEPAM 0.5 MG PO TABS: 0.5000 mg | ORAL_TABLET | Freq: Two times a day (BID) | ORAL | Status: DC

## 2011-03-30 NOTE — Telephone Encounter (Signed)
Faxed.   KP 

## 2011-03-30 NOTE — Telephone Encounter (Signed)
Last filled 03/02/11 last seen 12/09/10 please advise    KP

## 2011-04-13 NOTE — Discharge Summary (Signed)
[name], [name]               ACCOUNT NO.:  [id number]   MEDICAL RECORD NO.:  [id number]          PATIENT TYPE:  INP   LOCATION:  2033                         FACILITY:  [facility]   PHYSICIAN:  Sheliah Plane, MD    DATE OF BIRTH:  01-29-1946   DATE OF ADMISSION:  09/01/2007  DATE OF DISCHARGE:  09/08/2007                               DISCHARGE SUMMARY   DISCHARGE DIAGNOSES:  1. Acute type A aortic dissection.  2. Newly diagnosed diabetes mellitus.  3. Acute blood loss anemia postoperatively.  4. Volume overload postoperatively.  5. Hypertension.  6. Hyperlipidemia.  7. Status post permanent pacemaker by left subclavian vein.  8. History of pacemaker pocket becoming infected with treatment of      antibiotics.  9. History of type 3 descending right dissection.   PROCEDURES:  Replacement of ascending aorta and resuspension of aortic  valve with hypothermic circulatory arrest.   HISTORY OF PRESENT ILLNESS:  The patient is a 65 year old female who  admits to having CT scan to rule out aortic dissection and was  interpreted as mild ascending aortic dilatation to approximately 4.3 cm.  The patient has known hypertension and had previous history of type 3  aortic dissection treated at [facility] in 2000.  CT scan in mid  December showed no evidence of old descending aortic dissection.  On the  evening of October 3, at approximately 7 p.m., the patient again having  midsternal chest pain radiating to left neck and came immediately to  [facility] Emergency Room by EMS.  The patient's CT of the  chest showing extension of aortic dissection to abdominal aorta with  false lumen feeding the left kidney extending into left common iliac  artery.  Dr. Tyrone Sage was consulted after CT results.  The patient was  seen and evaluated by Dr. Tyrone Sage.  Dr. Tyrone Sage discussed with the  patient treatment options including urgent repair of the aortic  dissection.  He discussed risks  and benefits with the patient.  The  patient acknowledged her understanding and agreed to proceed.  The  patient was posted for emergency surgery on September 02, 2007.  For  details of the patient's past medical history and physical exam, please  see dictated H&P.   HOSPITAL COURSE:  The patient was taken to the operating room emergently  on September 02, 2007, where she underwent replacement of the ascending  aorta and resuspension of aortic valve with hypothermic circulatory  arrest.  The patient tolerated this procedure well and transferred to  the intensive care unit in stable condition.  Postoperatively, the  patient was noted to be hemodynamically stable.  She was extubated the  evening of surgery.  Post extubation, the patient noted to be alert and  oriented x4 and neurologically intact.  While in the intensive care  unit, the patient was progressing well.  Vital signs were monitored  closely.  She was able to be weaned from all drips.  Swan-Ganz catheter  discontinued in normal fashion.  Followup chest x-ray was taken and  noted to be stable.  Chest tube discontinued  in normal fashion.  By  postop day #1, cardiac rehabilitation had gotten the patient up out of  bed and ambulated.  The patient did well with ambulation.  Postoperatively, the patient's blood sugars were followed.  On the  intensive care unit, they were noted to be elevated and she was started  on Lantus insulin.  The patient did have volume overload and was started  on diuretics.  She also had slight acute blood loss anemia with  hemoglobin/hematocrit of 9.0 and 25.8 on postop day #2.  The patient  continued to progress well and was able to be transferred out to  telemetry floor on postop day #3.  On telemetry floor, the patient's  vital signs were continued to be monitored.  They did remain stable.  The patient remained afebrile.  With use of incentive spirometer and  increase in ambulation, the patient was able to be  weaned off oxygen  saturating greater than 90% on room air.  The patient's blood sugars  were continued to be monitored.  Hemoglobin A1c was checked on postop  day #5 and noted to be 7.  Due to elevated hemoglobin A1c and increased  blood sugars, it was felt that the patient would require p.o. diabetic  medication.  Lantus insulin was discontinued and the patient was started  on metformin.  After initiation of metformin, the patient's blood sugars  noted to be stable.  During the patient's postoperative course, she  remained A-paced with her internal pacer.  She had no postoperative  arrhythmias.  The patient's incisions were clean, dry and intact and  healing well.  She continued to progress well with ambulation working  with cardiac rehab.  Prior to discharge, case management was consulted  for discharge planning with plan to home with home health assistance.   LABORATORY DATA AND X-RAY FINDINGS:  On October 8, white count of 5.8,  hemoglobin 9.5, hematocrit 27.8, platelet count 97.  Sodium of 139,  potassium 4, chloride of 105, bicarb 27, BUN 15, creatinine 0.66,  glucose 104.   CONDITION ON DISCHARGE:  The patient is tentatively ready for discharge  home in the next 24 hours pending she remains stable.   FOLLOWUP:  Followup appointment has been arranged with Dr. Tyrone Sage for  September 28, 2007, at 2:30 p.m.  The patient will need to obtain a PMI  chest x-ray 30 minutes prior to this appointment.  The patient will need  to follow up with Dr. Clarene Duke in 2 weeks.  She will need to contact his  office to make these arrangements.   ACTIVITY:  The patient is instructed in no driving to released to do so.  No lifting over 10 pounds.  She is told to ambulate 3-4 times per day.  Progress as tolerated and continue her breathing exercises.   WOUND CARE:  The patient is told to shower washing her incisions using  soap and water.  She is to contact the office if she develops any  drainage or  opening from any of her incision sites.   DISCHARGE MEDICATIONS:  1. Aspirin 325 mg daily.  2. Coreg 6.25 mg b.i.d.  3. Crestor 20 mg at night.  4. Xanax 0.5 mg p.r.n.  5. Zoloft 150 mg daily.  6. Folic acid 1 mg daily.  7. Lasix 40 mg b.i.d. x1 week, then 40 mg daily.  8. Potassium chloride 20 mEq b.i.d. x1 week, then 20 mEq daily.  9. Glucophage 500 mg b.i.d.  10.Ultram 50  mg 1-2 tablets q.4 h. p.r.n. pain.      Theda Belfast, [place]      Sheliah Plane, MD  Electronically Signed    KMD/MEDQ  D:  09/08/2007  T:  09/09/2007  Job:  [id number]   cc:   Thereasa Solo. Little, M.D.

## 2011-04-13 NOTE — H&P (Signed)
[name], [name]               ACCOUNT NO.:  [id number]   MEDICAL RECORD NO.:  [id number]          PATIENT TYPE:  INP   LOCATION:  2303                         FACILITY:  [facility]   PHYSICIAN:  Sheliah Plane, MD    DATE OF BIRTH:  [birth year]   DATE OF ADMISSION:  09/01/2007  DATE OF DISCHARGE:                              HISTORY & PHYSICAL   REASON FOR ADMISSION:  Acute aortic dissection.   CONSULT REQUESTED:  By Dr. Cathren Laine.   CARDIOLOGIST:  Dr. Clarene Duke.   HISTORY OF PRESENT ILLNESS:  The patient is a 65 year old female who  presented to [facility] via EMS at 7:35 on 10/3.  She was evaluated  in the emergency room.  She had been working in her yard and  approximately an hour prior to her coming to the emergency room having  chest and left neck pain which persisted, radiating to her jaw and neck.  She had no syncope.   The patient has known history of hypertension.  She notes that in 2000  she was treated for a Type III descending aortic dissection which had  healed.  I mid September she had been seen by Dr. Clarene Duke and a CT scan  of the chest was done that showed very mild aortic ascending aortic  dilatation and no evidence of ascending or descending dissection.  A  repeat CT scan was done because of the onset of severe, unrelenting  chest pain radiating to the neck and she was found to have a Type III  aortic dissection.  Cardiac surgery was notified just after midnight at  12:04 and was taken urgently to the operating room for repair.   PAST MEDICAL HISTORY:  Hypertension, hyperlipidemia, mild glucose  elevation but not actively treated for diabetes, recent hemoglobin A1c  was 6.2, history of hyperlipidemia treated with Crestor.  She is a  nonsmoker.   PAST SURGICAL HISTORY:  Includes placement of a permanent pacemaker by  the left subclavian vein.  The patient noted that the pacemaker pocket  became infected and was treated with antibiotics but has had no  problems  for the past year, and was recently evaluated by Dr. Clarene Duke.   FAMILY AND SOCIAL HISTORY:  The patient lives with her husband who is  partially incapacitated from previous strokes.   HOME MEDICATIONS:  1. Zoloft 150 mg daily.  2. Xanax 0.5 mg p.r.n.  3. Hydrochlorothiazide 12.5 mg a day.  4. She had been on Lopressor 25 mg a day but this was recently stopped      because of side effects of tiredness and fatigue.  5. Azor 5/20 was also decreased.  6. Crestor 20 mg a day.   ALLERGIES:  SULFA MEDICATIONS but she does not remember the exact side  effects.   REVIEW OF SYSTEMS:  The patient does give symptoms of orthostatic blood  pressure and faintness when she stands quickly, which has been chronic.  Denies fever or chills, denies amaurosis, denies any previous stroke, up  until the night of admission denies chest pain.  She does note that she  had  had a previous cardiac catheterization several years ago.   PHYSICAL EXAMINATION:  The patient is awake, alert, agitated and  obviously uncomfortable and anxious.  Heart rate was 62 and sinus, blood  pressure 110/40.  The patient is moderately obese, weighing over 200  pounds, 5 feet 1 inch tall.  She is neurologically intact and able to  relate her history.  She has no carotid bruits, faint murmur of aortic  insufficiency, distant breath sounds bilaterally.  Abdominal exam is  without tenderness.  She does have bowel sounds.  Lower extremities are  with 2+ dorsalis pedis pulses bilaterally, 1+ PT pulses bilaterally.  She has no pedal edema.   Laboratory findings are reviewed.  CT scan is reviewed with radiology  and does confirm the Type I aortic dissection which involves the  ascending aorta starting just above at the [facility] ridge extending  through the arch.  The abdominal viscera appear all to be perfused.  The  left kidney is perfused off the false channel, the right kidney off the  true channel.  The dissection descends  down into the left iliac.   IMPRESSION:  Acute aortic dissection in a patient who is neurologically  intact.  Emergency surgery is recommended to the patient to be carried  out as soon as she can be transported to the operating room.  The  patient and her family are aware of the risks of surgery including  death, infection, stroke, myocardial infarction, and blood transfusion.  She is also aware that the non-operative treatment carried mortality of  1-2% per hour and she wishes to proceed.      Sheliah Plane, MD  Electronically Signed     EG/MEDQ  D:  09/03/2007  T:  09/03/2007  Job:  [id number]   cc:   Thereasa Solo. Little, M.D.

## 2011-04-13 NOTE — Assessment & Plan Note (Signed)
OFFICE VISIT   LESLE, FARON  DOB:  [birth year]                                        July 25, 2008  CHART #:  [id number]   The patient is a 65 year old female, who originally presented  approximately 1 year ago with an acute type I aortic dissection.  Subsequently, a followup CT scan was done in April 2009, she noted  fullness in the upper portion of her sternal incision and in her lower  neck, and a CT scan of the neck and subsequent CT scan of the chest was  performed by her medical physician.  She presents to the office today  for evaluation.  She denies any fever or chills.  Notes that she has had  no popping or clicking in her sternal incision.  She does have  complaints of fullness in her lower neck just above the upper pole of  the sternal incision, nonpulsatile.   PHYSICAL EXAMINATION:  VITAL SIGNS:  Blood pressure 135/73, pulse is 92,  respiratory rate 18, O2 sats 96%.  LUNGS:  Clear.  CARDIAC:  I do not appreciate any murmur of aortic insufficiency.  EXTREMITIES:  She has no pedal edema.  CHEST:  On careful examination of her sternum, there is no pop or click.  The sternal incision is totally healed without erythema or evidence of  infection just above the upper pole of her sternal incision along the  sternocleidomastoid muscles is a area approximately 2 cm which is firm  and somewhat fixed to the upper pole of the sternum without erythema or  really tenderness except for hard palpation.   I have reviewed the old and new CT scans.  I do not agree with the  Radiology, there does not appear to be infection on physical exam or  more by history.  The area is very firm, not connected to the aorta.  I  suspect that it is ectopic scar/keloid formation related to her previous  surgery and not acute infection.  I have asked her to stop the p.o.  antibiotics she is taking.  At this point, I would not recommend any  operative intervention.  I have  made a return appointment to see me in 3-  4 weeks and reevaluate it.  Future consideration, therefore, is the  progressive enlargement  would be a biopsy of the area.  She does also have a stable right  thyroid nodule that was present on previous CT scans.   Sheliah Plane, MD  Electronically Signed   EG/MEDQ  D:  07/25/2008  T:  07/26/2008  Job:  [id number]   cc:   Titus Dubin. Alwyn Ren, MD,FACP,FCCP  Thereasa Solo. Little, M.D.

## 2011-04-13 NOTE — Assessment & Plan Note (Signed)
OFFICE VISIT   Anna, [name]  DOB:  09-11-1946                                        September 28, 2007  CHART #:  [id number]   Ms. [name] returns to the office today in followup after her acute  type-A aortic dissection with replacement of the ascending aorta and  resuspension of the aortic valve with hypothermic circulatory arrest  done on September 02, 2007.  She was seen in the office last week because  of some wound difficulties in her right groin cannulation site.  The  staples were removed and she has continued with local wound care and  comes in today for a followup visit.  Since seen last week, she has seen  Dr. Clarene Duke and had some changes in her blood pressure medication.   PHYSICAL EXAMINATION:  Vital signs:  Her blood pressure is 115/68, pulse  is 84, respiratory rate is 18, O2 SAT is 92%.  Chest:  The sternum is  stable and well healed.  Heart:  I do not hear any murmur of aortic  insufficiency.  Extremities:  The right groin incision is without  evidence of infection and now almost completely healed with one very  small area of granulation tissue.   The patient will continue with followup with Dr. Laury Axon for her medical  problems and also continues to be followed by Dr. Clarene Duke.  I will plan  to see her back in six months with a followup CT scan to evaluate her  remaining aorta and after that will determine the need for further  followup CT scans.   Sheliah Plane, MD  Electronically Signed   EG/MEDQ  D:  09/28/2007  T:  09/28/2007  Job:  [id number]   cc:   Thereasa Solo. Little, M.D.  Lelon Perla, DO

## 2011-04-13 NOTE — Op Note (Signed)
[name], WOODEN               ACCOUNT NO.:  [id number]   MEDICAL RECORD NO.:  [id number]          PATIENT TYPE:  INP   LOCATION:  2303                         FACILITY:  [facility]   PHYSICIAN:  Sheliah Plane, MD    DATE OF BIRTH:  June 18, 1946   DATE OF PROCEDURE:  09/02/2007  DATE OF DISCHARGE:                               OPERATIVE REPORT   PREOPERATIVE DIAGNOSIS:  Acute type A aortic dissection.   POSTOPERATIVE DIAGNOSIS:  Acute type A aortic dissection.   PROCEDURE:  Replacement of ascending aorta and resuspension of aortic  valve with hypothermic circulatory arrest.   SURGEON:  Sheliah Plane, M.D.   ASSISTANT:  Lenise Herald, SA.   BRIEF HISTORY:  The patient is a 65 year old female who coincidentally  in mid September had a CT scan to rule out aortic dissection and was  interpreted as normal with mild ascending aortic dilatation to  approximately 4.3 cm.  The patient is known hypertensive and had a  previous history of type B or type 3 aortic dissection, treated at  [facility] in [facility].  The CT scan in mid September showed no  evidence of old descending aortic dissection.  On the evening of October  3, approximately 7 p.m. the patient again having midsternal chest pain  radiating into the left neck and came immediately to the [facility] emergency room by EMS.  She was evaluated including a CT scan  of the chest and cardiac surgery was notified just after midnight.  The  patient was quickly evaluated and taken directly to the operating room.  The patient, her daughter, and husband had the treatment options  discussed with them and because of the highly lethal nature of aortic  dissection, urgent repair was recommended.  The patient agreed and  signed informed consent.   DESCRIPTION OF PROCEDURE:  The patient was taken directly from the  emergency room to the operating room where Burna Forts, M.D.  placed monitoring lines and Theone Murdoch catheter.   The patient underwent  general endotracheal anesthesia.  Transesophageal echocardiogram probe  was used throughout the procedure.  By CT scan the dissection flap  extended into the left iliac artery.  The patient is morbidly obese with  a very small frame, option of right subclavian cannulation versus  femoral cannulation was evaluated and decided that due to her obesity  and small frame that access to the femoral artery may actually be easier  and safer.  The skin of the chest, abdomen, and legs were prepped with  Betadine and draped in the usual sterile fashion.  Incision was made in  the right groin, dissection carried down isolating the right common  femoral artery.  The artery was reasonable size and had no evidence of  dissection in it.  Median sternotomy was performed.  Pericardium was  opened.  The ascending aorta was obviously dissected.  The right  ventricle and anterior wall of the left ventricle appeared to be beating  well.  By TEE the dissection flap was also identified and probably  started at the sinotubular ridge toward  the noncoronary cusp.  There was  mild aortic insufficiency.  The patient was systemically heparinized.  The right femoral artery was cannulated with a straight femoral 22  French cannula.  Right atrium was cannulated with a dual stage cannula.  The patient was placed on cardiopulmonary bypass of 2.4 liters per  minute per meter square.  A retrograde cardioplegia catheter was placed.  A right superior pulmonary vein LV vent was also placed.  Barbiturates  and steroids were administered to the patient.  The body was cooled to  18 degrees.  The head was topically cooled.  No crossclamp was applied.  With body temperature cooled to 18 degrees, circulatory arrest was  commenced.  Topical cold was also used to cool the myocardium further.  The aorta was opened longitudinally confirming both the true and false  lumen.  The aorta was excised.  Attention was turned  first to the distal  aorta.  BioGlue was used to reapproximate the edges true and false lumen  at the distal end of the ascending aorta just before the takeoff of the  innominate artery.  A 30 mL Foley balloon inflated was used to shape and  stabilize this repair with the BioGlue.  Using a felt strip  circumferentially, a 28 mm sidearm graft was then anastomosed to the  distal ascending aorta just at the takeoff of the innominate artery.  With this anastomosis completed, antegrade aortic perfusion was then  slowly begun deairing the arch.  During this procedure, the head had  been kept in a severely down position with the arterial line and graft  and arch deaired, the crossclamp was placed on the graft just distal to  the perfusion sidearm.  The patient's antegrade perfusion was then  reinstituted.  The femoral perfusion line was clamped.  Rewarming of the  patient was then begun.  The anastomosis was carefully inspected and was  hemostatic.  Attention was then turned to the proximal ascending aorta.  Further areas of the dissection were removed.  The right and left  coronary ostium were identified and were patent.  Had not been avulsed  by the dissection.  The aortic valve was a tricuspid valve with fairly  thin leaflets and it was felt that preservation of the valve could be  obtained.  In a similar fashion, the dissected layers of the aorta were  then glued back together and a 30 mL Foley balloon was used to shape and  stabilize the repair.  The felt strip proximally was placed around the  ascending aorta and the proximal anastomosis was performed with a  running 3-0 Prolene.  Prior to completing the anastomosis the heart was  allowed to passively fill and deairing the graft and the ventricle.  With anastomosis completed, the aortic crossclamp was removed from the  graft after BioGlue had been placed along the suture line anastomosis.  Rewarming was continued.  The total circulatory  arrest time was just  under 40 minutes.  Total crossclamp time was 32 minutes.  With the  patient's body temperature rewarmed to 37 degrees, she spontaneously  converted to a sinus rhythm.  Atrial and ventricular pacing wires were  applied.  The retrograde cardioplegic catheter which has been used to  intermittently give cold blood cardioplegia was removed.  The patient  did have an internal pacer which was sensing the atrium and pacing the  ventricle satisfactorily.  Prior to coming off bypass, the right femoral  cannula was removed and the transverse  opening of the femoral artery was  repaired with running 6-0 Prolene and blood flow restored to the right  leg.  The patient was separated from cardiopulmonary bypass and remained  hemodynamically stable.  The right atrial cannula and the vent had been  removed.  Protamine sulfate was administered.  Fresh frozen and  platelets were also administered.  The perfusion sidearm was doubly  ligated and divided.  The patient remained hemodynamically stable.  With  the operative field hemostatic, the pericardium was loosely  reapproximated.  Two Blake drains, one inferior and one in the superior  mediastinum, were then placed.  The sternum was closed with #6 stainless  steel wire, the fascia with interrupted 0 Vicryl, running 3-0 Vicryl in  the subcutaneous tissue, 4-0 subcuticular stitch in the skin edges.  Dry  dressings were applied.  The right groin incision was closed in the deep  layers with interrupted 0 Vicryl and skin staples in skin edges.  The  patient tolerated the procedure without obvious complications and was  transferred to the surgical intensive care unit for further  postoperative care.      Sheliah Plane, MD  Electronically Signed     EG/MEDQ  D:  09/03/2007  T:  09/03/2007  Job:  [id number]   cc:   Thereasa Solo. Little, M.D.

## 2011-04-13 NOTE — Assessment & Plan Note (Signed)
OFFICE VISIT   PAMI, WOOL  DOB:  Jul 13, 1946                                        September 21, 2007  CHART #:  [id number]   HISTORY OF PRESENT ILLNESS:  Ms. [name] returns to the office today in  followup after replacement of her ascending aorta and re-suspension of  aortic valve with hyperthermic circulatory arrest, because of acute type  A, aortic dissection on September 02, 2007. She is making good progress  postoperatively. Does complain of some depression. She has been  increasing her physical activity appropriately. Has no angina. No overt  symptoms of congestive heart failure. She comes in to the office today  to check her right groin incision at the cannulation site and removal of  staples. Followup chest x-ray is also obtained.   The patient had elevated glucose's during her admission and ultimately  was discharged home with new diagnosis of diabetes and was started on  Metformin b.i.d. With this, her glucose's have been running between 90  and 130.   PHYSICAL EXAMINATION:  GENERAL:  Blood pressure 133/71, pulse 84,  respiratory rate 18. O2 saturation 94%.  CHEST:  Sternum is stable and well healed.  CARDIOVASCULAR:  I do not appreciate any murmur of aortic insufficiency.  EXTREMITIES:  She has minimal pedal edema.  SKIN:  She has some areas of skin breakdown in the right groin incision.   PLAN:  Staples were removed. The wound does not appear infected. She  will proceed with local wound care and we will recheck her wound in one  week. I have encouraged her to see Dr. Laury Axon  at [facility] for followup with her diabetes  care. She is to see Dr. Clarene Duke next week.   Sheliah Plane, MD  Electronically Signed   EG/MEDQ  D:  09/21/2007  T:  09/21/2007  Job:  [id number]   cc:   Lelon Perla, DO  Thereasa Solo. Little, M.D.

## 2011-04-13 NOTE — Letter (Signed)
November 09, 2007     RE:  RONEE, RANGANATHAN  MRN:  [id number]  /  DOB:  [birth year]   To Whom It May Concern:   This 65 year old female was hospitalized in October 2008 with acute type  A aortic dissection that extended down to the renal artery.  She  underwent emergency surgery by Dr. Tyrone Sage, and continues to have  problems with marked tiredness and fatigue.  Even prior to this, she had  trouble with fatigue.  As the result of brady/tachy syndrome, she had a  permanent pacemaker put in October 2007 because of symptomatic  bradycardia.  This was complicated by a stitch infection, which has  finally healed.  She continues to have problems with restlessness.  She  finds that her stamina is diminished, and she has been unable to sustain  activities for greater than 30 minutes at a time before she stops and  rests.  She has had no syncope, but overall her health has progressively  deteriorated.  She also has past medical history of anxiety,  hyperlipidemia, hypertension, hyperglycemia.  At this point, she would  be unable to maintain gainful employment.   If additional information is needed, please feel free to contact me at  any time.    Sincerely,      Lelon Perla, DO  Electronically Signed    Shawnie Dapper  DD: 11/09/2007  DT: 11/09/2007  Job #: [id number]

## 2011-04-13 NOTE — Assessment & Plan Note (Signed)
OFFICE VISIT   [name], [name]  DOB:  11/17/1946                                        March 05, 2010  CHART #:  [id number]   The patient returns to the office today in followup after acute type A  aortic dissection with replacement of ascending aorta and resuspension  of aortic valve done September 02, 2007.  She continues to do well.  She  has been diligent about controlling blood pressure.  She does complain  of chronic back pain but this is not changed.  She denies any anginal  type chest pain.  She has had no evidence of congestive heart failure.   On exam her blood pressure is 140/83, pulse 67, respiratory rate is 18,  O2 saturations 97%.  Her sternum is stable and well healed.  In August  of 2009, she had some fullness in the upper portion of her sternal  incision. Overtime, this is completely disappeared and she attributes  this to the chiropractor manipulation.  However, does not appear to be  any abnormalities of her sternal incision at this time.  Her bowel  sounds are crisp without any murmur of aortic insufficiency.  She has  mild pedal edema.  She notes that this becomes worse with certain  medications.   She is currently on Lasix 40 mg a day, Coreg 12.5 mg twice a day,  Crestor 20 mg a day, aspirin 81 mg a day, Lexapro 10 mg a day, Januvia  100 mg a day, Azor 20 mg a day.   CT scan today shows a persistent false lumen in her arch and descending  aorta that is unchanged from the scan 1 year before.  There has been no  evidence of progressive dilatation of her aorta.  The ascending aortic  repair appears stable.   Overall, she seems stable from a cardiac standpoint and has been  diligent about trying to control blood pressure.  I plan to see her back  with a followup scan in 1 year.   Sheliah Plane, MD  Electronically Signed   EG/MEDQ  D:  03/05/2010  T:  03/05/2010  Job:  [id number]   cc:   Lelon Perla, DO  Thereasa Solo Little, M.D.

## 2011-04-13 NOTE — Assessment & Plan Note (Signed)
OFFICE VISIT   LYNDIA, BURY  DOB:  08/07/1946                                        March 21, 2008  CHART #:  [id number]   Ms. Mavity returns to the office today in 31-month follow-up after  replacement of her ascending aorta and resuspension of the aortic valve  with hypothermic circulatory arrest done on September 02, 2007.  At that  time, the patient had presented acutely to the emergency room with a  previous history of dissection treated medically at [facility]  some years before.  She was taken directly to the operating room and  underwent surgery and had a uneventful recovery.  At this point she is  doing well, without specific complaints and returns for a follow-up CT  of the chest.  She does note that she has been followed by Dr. Clarene Duke  closely for adjustment of her blood pressure medicine and also has been  getting off Zoloft and taking Lexapro.   PHYSICAL EXAMINATION:  Blood pressure 137/78, pulses 88 and regular,  respiratory rate is 18, O2 sats 98%.  The patient's sternum is stable  and well healed.  Lungs are clear bilaterally.  I do not appreciate any  murmur of aortic insufficiency.  She has no pedal edema.  Follow-up CT  scan and was performed that shows stable repair of the ascending aorta.  She has a persistent false lumen from the left subclavian down into the  abdominal aorta but without any evidence of aortic dilatation of the  descending aorta.   I have reviewed with her the findings of the CT scan and the importance  of follow-up over the years and good blood pressure control to cut down  the risk of progressive enlargement of the descending aorta.  The  patient did share with me a letter sent to her from [facility], as she had raised issues  about the delay in her care in the emergency room prior to the actual  diagnosis of the dissection.   I plan to see her back in  1 year with a follow-up CT scan.  She will  continue to be followed by Dr. Clarene Duke from cardiology.   CURRENT MEDICATIONS:  Please note her current medications include  1. Lasix 40 mg a day.  2. Carvedilol 12.5 b.i.d.  3. Crestor 200 mg a day.  4. Aspirin 325 mg a day.  5. Lexapro 10 mg a day.  6. Lisinopril 20 mg a day.  7. Clonazepam 0.5 b.i.d.   Sheliah Plane, MD  Electronically Signed   EG/MEDQ  D:  03/21/2008  T:  03/21/2008  Job:  [id number]

## 2011-04-13 NOTE — Assessment & Plan Note (Signed)
OFFICE VISIT   CHIMERE, KLINGENSMITH  DOB:  03-Dec-1945                                        March 27, 2009  CHART #:  [id number]   The patient is a 65 year old female who presented in October 2008 with  acute type 1 aortic dissection and at that time underwent replacement of  the ascending aorta and resuspension of the aortic valve and has had  followup CT scan.  Since August 2009, she had a fullness at the upper  portion of her sternal incision without any popping or clicking since  this has completely resolved.  Overall, she feels well other than  describing significant stress in her life with a recent 22-day  hospitalization of her husband who suffered long term from stroke.   On physical exam today, her blood pressure is 149/85, pulse is 86,  respiratory rate is 18, O2 sat is 95%.  She notes that she did not take  her blood pressure medicine today noting that she failed to read the  directions prior to her CAT scan and thought she was not supposed to  take anything by mouth.  She is neurologically intact.  Lungs are clear  bilaterally.  Cardiac exam reveals regular rate and rhythm without  murmur or gallop.  Her sternum is stable and well healed.  The fullness  in her sternum and tenderness that was present in August 2009 is now  completely resolved.  She has equal brachial and pedal pulses.  She does  have mild pedal edema bilaterally.   Followup CT scan was performed that shows chronic filling of the true  and false lumen in the arch and descending aorta but without any  evidence of progressive enlargement of the aorta.   Her medications are reviewed with her.  She continues on Lasix, Coreg,  Crestor, aspirin, Lexapro, flurazepam, Azor, and Januvia.  Good blood  pressure control was again reemphasized  with the patient.  I plan to see her back in 1 year with a followup CT  scan of the chest to rule out any dilatation of the ascending aorta.   Sheliah Plane, MD  Electronically Signed   EG/MEDQ  D:  03/27/2009  T:  03/28/2009  Job:  [id number]   cc:   Thereasa Solo. Little, M.D.

## 2011-04-16 NOTE — Discharge Summary (Signed)
NAMELYNNSIE, LINDERS                           ACCOUNT NO.:  [id number]   MEDICAL RECORD NO.:  [id number]                   PATIENT TYPE:  INP   LOCATION:  2912                                 FACILITY:  [facility]   PHYSICIAN:  Madaline Savage, M.D.             DATE OF BIRTH:  04-Mar-1946   DATE OF ADMISSION:  03/31/2004  DATE OF DISCHARGE:  04/02/2004                                 DISCHARGE SUMMARY   DISCHARGE DIAGNOSES:  1. Chest pain and fatigue, improved at discharge.  2. Bradycardia, beta blocker stopped this admission.  3. Normal coronaries and normal left ventricular function at catheterization     this admission.  4. History of type 3 aortic dissection, treated medically, diagnosed in May     2000.  5. Controlled hypertension.  6. Dyslipidemia, intolerant to Lipitor and Zocor, now on Crestor.  7. History of a right adrenal adenoma on CT scan in April 2004, followed at     [facility].  8. History of SULFA allergy.   HOSPITAL COURSE:  Ms. Bokhari is a delightful 65 year old female followed  by Dr. Marlowe Kays and Dr. Newt Lukes at [facility]  She has a history  of aortic dissection in April 2000, that has been treated medically.  She  presented on Mar 31, 2004, with epigastric pain and fatigue.  On admission,  her EKG showed sinus bradycardia with a heart rate of 45, nonspecific ST  changes.  Enzymes were negative x2.  CT scan of her chest showed no  pulmonary embolism and no dissection.  She was admitted to telemetry,  started on IV heparin, and ruled out for a MI.  Cardiac catheterization was  done on Apr 01, 2004, by Dr. Caprice Kluver.  This revealed essentially normal  coronaries and normal left ventricular function.  It was felt that her  symptoms may be secondary to bradycardia, and Dr. Clarene Duke stopped her  labetalol and added Norvasc.  She has also had myalgias on Lipitor, and also  on Zocor which she was taking every other day as an outpatient.  We stopped  her Zocor  and we will try her on Crestor 10 mg a day.  Dr. Clarene Duke feels she  can be discharged on Apr 02, 2004.  She will follow up with Dr. Adella Hare.   DISCHARGE MEDICATIONS:  1. Cozaar 50 mg b.i.d.  2. Coated aspirin daily.  3. Zoloft 50 mg in the morning, 100 mg in the evening.  4. Crestor 10 mg once a day.  5. Norvasc 5 mg a day.   LABORATORY DATA:  TSH was 1.15.  CK-MB and troponins were negative.  White  count was 6.3, hemoglobin 12, hematocrit 34.7, platelets 145.  INR is within  normal limits.  Sodium 141, potassium 4.2, BUN 16, creatinine 0.9.  Liver  functions are normal.  Lipid profile revealed a total cholesterol of 291,  HDL of 39, LDL of  200.  CT scan of the chest showed a 1.7 x 1.9 cm right  adrenal nodule.  There was no evidence for pulmonary embolism or aortic  dissection.  There was some scarring noted in the right lower lobe and left  lower lobe.  There was also an incidental finding of an 1.4 cm renal cyst.  EKG revealed sinus bradycardia, nonspecific ST changes.   DISPOSITION:  The patient is discharged in stable condition and will follow  up with Dr. Adella Hare.  She has been instructed to be aware of possible  rebound tachycardia and hypertension coming off beta blocker, she will  notify Dr. Adella Hare if this occurs.      Abelino Derrick, P.A.                      Madaline Savage, M.D.    Lenard Lance  D:  04/02/2004  T:  04/02/2004  Job:  [id number]   cc:   Marveen Reeks. Adella Hare, M.D.  Professor of Internal Medicine  Cardiology  [facility]  Medicine at [employer]

## 2011-04-16 NOTE — Cardiovascular Report (Signed)
[name], SWANTON                           ACCOUNT NO.:  [id number]   MEDICAL RECORD NO.:  [id number]                   PATIENT TYPE:  INP   LOCATION:  2912                                 FACILITY:  [facility]   PHYSICIAN:  [name], M.D.              DATE OF BIRTH:  Nov 04, 1946   DATE OF PROCEDURE:  04/01/2004  DATE OF DISCHARGE:                              CARDIAC CATHETERIZATION   PROCEDURES PERFORMED:   CARDIOLOGIST:  [name], M.D.   INDICATIONS FOR TEST:  [name] is a 65 year old female who in 2000 had  a Type III aortic dissection.  This has been followed closely by Dr.  Adella [name] and it appears to have healed.  The etiology was felt to be  uncontrolled hypertension.  She had done well from a cardiac standpoint and  had a negative stress echo in 2002, but was admitted to the hospital 24  hours earlier with complaints of chest discomfort, bradycardia to the upper  30s and mild hypotension.  Her cardiac enzymes were unremarkable.  Her EKG,  other than bradycardia, is unimpressive and she is now back to baseline.  She complains of being tired and fatigued for months leading up to this.  Her thyroid functions are normal.  Her hemoglobin is stable.  She had a CT  scan of her chest that failed to show any evidence of aortic dissection or  significant abnormalities.   DESCRIPTION OF PROCEDURE:  The patient was prepped and draped in the usual  sterile fashion exposing the right groin.  Following local anesthetic with  1% Xylocaine the Seldinger technique was employed and a 5 [place] introducer  sheath was placed into the right femoral artery.  Left and right coronary  arteriography and ventriculography in the RAO projection were performed.  A  long 25 cm curved J-wire was used to traverse the aortic arch and long wire  exchange catheter technique was performed to eliminate any potential trauma  to the arch.   EQUIPMENT:  Five French Judkins Configuration  catheters.   RESULTS:   HEMODYNAMIC DATA:  Central aortic pressure is 153/69.  Left ventricular  pressure 153/16 with no significant valve gradient noted at the time of pull  back.   VENTRICULOGRAPHIC DATA:  Ventriculography in the RAO projection revealed  normal left ventricular systolic function with an ejection fraction greater  than 60%.  The end-diastolic pressure was 13.   ARTERIOGRAPHIC DATA:  Coronary Arteriography  No calcification was seen on fluoroscopy.   1. Left Main:  The left main bifurcated.   1. LAD:  The LAD was a medium-sized vessel, and extended down and crossed     the apex of the heart.  It gave rise to a large first diagonal branch.     It was about 2.5-3 mm on diameter.  This entire system was free of     disease, although there was mild tortuosity.  1. Circumflex:  The circumflex gave rise to a very large first OM vessel     that was free of disease.  The second OM vessel was relatively small,     about 1.5 mm.  It was free of disease.   1. Right Coronary Artery:  The right coronary artery is a dominant vessel.     It gives rise to a large PDA and two posterolateral branches that were     medium size.  This system was free of disease.   CONCLUSION:  1. Normal left ventricular systolic function.  2. No evidence coronary artery disease.   I clearly cannot explain her chest pain based on her coronary anatomy and  based on the computerized tomographic scan there is nothing to suggest that  she had repeat dissection.  Her bradycardia could be explained by her  labetalol, which was 200 mg b.i.d.; however, she has been on this dose for  quite some time.  This has not been started and now 24 hours later her pulse  is still 52, but sinus.   PLAN:  1. I plan to stop her heparin and stop her IV nitroglycerin.  2. Will continue her in Cozaar and start her on Norvasc 5 mg a day.  3. I will ask her to follow up with [name] for blood pressure      management; and, at this point I do not feel that a pacemaker is     indicated.  4. We will monitor her for an additional 24 hours to make sure that her     relatively slow pulse resolves and with ambulation her heart rate shows     an appropriate increase.                                               [name], M.D.    ABL/MEDQ  D:  04/01/2004  T:  04/02/2004  Job:  [id number]   cc:   [name], M.D.  [place] [facility]  [facility]   Catheterization Laboratory   [name], M.D.  [phone], [address]  [place]  [place] [address]  Fax: [phone]

## 2011-04-16 NOTE — Op Note (Signed)
[name], [name]               ACCOUNT NO.:  [id number]   MEDICAL RECORD NO.:  [id number]          PATIENT TYPE:  OIB   LOCATION:  2807                         FACILITY:  [facility]   PHYSICIAN:  Darlin Priestly, MD  DATE OF BIRTH:  27-Jul-1946   DATE OF PROCEDURE:  09/13/2006  DATE OF DISCHARGE:                                 OPERATIVE REPORT   PROCEDURE:  Insertion of a Medtronic EnRhythm generator, model #P1501DR,  serial [id number], with passive atrial and ventricular leads.   ATTENDING SURGEON:  Darlin Priestly, MD.   COMPLICATIONS:  None.   INDICATIONS:  The patient is a 65 year old female patient of Dr. Caprice Kluver  with a history of a type 3 aortic dissection documented by CT scan, history  of symptomatic bradycardia with intermittent tachy palpitations and sick  sinus syndrome.  She has previously been on a beta blocker secondary to her  repaired aortic dissection; however, she was unable to tolerate this  secondary to low heart rates.  She is now referred for dual-chamber pacer  implant secondary to symptomatic bradycardia.   DESCRIPTION OF OPERATION:  After the patient gave informed written consent,  the patient was brought to the cardiac cath lab, where the left chest was  prepped and draped in sterile fashion.  ECG monitoring was established.  Lidocaine 1% was then used to anesthetize the left midsubclavicular area.  Next, approximately a 3-cm mid-left subclavicular incision was then carried  out and hemostasis was obtained with electrocautery.  Blunt dissection used  to carry this down to the left pectoral fascia.  Approximately a 3 x 4-cm  pocket was then created over the left pectoral fascia, and again hemostasis  was obtained with electrocautery.  The left subclavian vein was then easily  entered with 2 separate sticks with introduction of 2 separate guide wires  into the SVC and right atrium.  Silk 4-0 sutures were then placed at the  base of the retained  guide wires.  Next, a 7-French safe sheath was then  placed over the first retained guide wire, and the dilator and guide wire  were removed.  Through this, a 52-cm passive Medtronic lead, model N6305727,  serial # [id number], was then easily passed into the right atrium and the  peel-away sheath was removed.  A second 7-French dilator and sheath were  then placed over the second retained guide wire, and the dilator and guide  wire were removed.  A 45-cm passive Medtronic lead, model #5594, serial  #[id number], was then  easily passed into the right atrium and the peel-away  sheath was removed.  A curve was then placed on the ventricular lead stylet,  and the ventricular lead was then allowed to prolapse through the tricuspid  valve and was positioned in the RV apex without difficulty.  Thresholds were  then determined.  R waves were measured at 17.3 mV.  Impedance was 838 ohms.  Threshold was 0.3 V at 0.5 msec.  Current was 0.5 mA, and 10 V was without  diaphragmatic stimulation.  This lead was then secured with 2 silk sutures.  The atrial lead was then positioned in the right atrial appendage and  thresholds determined.  P waves were measured at 9.2 mV.  Impedance was 4531  ohms.  Threshold was 0.5 V at 0.5 mV.  Current was 0.2 mA.  Again, 10 V was  negative for diaphragmatic stimulation.  This lead was then anchored in  place with 2 silk sutures.  The pocket was then copiously irrigated with 1%  kanamycin solution.  Again hemostasis was confirmed.  The leads were then  connected in a serial fashion to a Medtronic EnRhythm [id number] generator,  serial # [id number].  Header screws were tightened and pacing was confirmed.  A single silk suture was then placed at the apex of the pocket.  The  generator and leads were then delivered into the pocket and the header was  secured to the silk suture.  The subcutaneous layer was then closed with 2-0  running Vicryl.  The skin layer was then closed with  running 4-0 Vicryl.  Steri-Strips were then applied.  The patient returned to the recovery room  in stable condition.   CONCLUSIONS:  Successful implant of a Medtronic EnRhythm [id number] generator,  serial [id number], with  passive atrial and ventricular leads.      Darlin Priestly, MD  Electronically Signed     RHM/MEDQ  D:  09/13/2006  T:  09/13/2006  Job:  [id number]   cc:   Thereasa Solo. Little, M.D.

## 2011-04-16 NOTE — Discharge Summary (Signed)
NAMEJAZLINE, [name]               ACCOUNT NO.:  [id number]   MEDICAL RECORD NO.:  [id number]          PATIENT TYPE:  OIB   LOCATION:  6522                         FACILITY:  [facility]   PHYSICIAN:  Darlin Priestly, MD  DATE OF BIRTH:  24-Dec-1945   DATE OF ADMISSION:  09/13/2006  DATE OF DISCHARGE:  09/14/2006                                 DISCHARGE SUMMARY   DATE OF ADMISSION:  09/13/2006   DATE OF DISCHARGE:  09/14/2006   DISCHARGE DIAGNOSES:  1. Status post permanent transvenous pacemaker implantation secondary to      sick sinus syndrome.  2. History of type 3 aortic dissection with unremarkable left chest CT.  3. Hypertension with labile blood pressure.  4. Mild valvular issues including mild aortic insufficiency and mild      tricuspid regurgitation, trace mitral regurgitation and mild pulmonary      insufficiency with normal left ventricular function.   HISTORY OF PRESENT ILLNESS/HOSPITAL COURSE:  This is a 66 year old patient  of Dr. Clarene Duke who was seen in the office for reevaluation of chest  discomfort and bradycardia.  Dr. Clarene Duke was concerned with tiredness and  fatigue and overall edema which was thought to be related to brady tachy  syndrome.  Patient was admitted for pacemaker implantation by Dr. Jenne Campus.  The procedure was done on 09/13/2006.  The patient received Medtronic device  atrial lead serial #[id number] and ventricular leads serial #[id number].  She tolerated the procedure well and there were no immediate complications  post procedure.  The next morning the pacer was interrogated by the pacer  representative from [employer] company and device revealed normal  functioning, no changes were done, all electrical parameters were within  normal limits.  In the morning the patient was assessed by Dr. Clarene Duke.  Her  pacer site remained dry and Steri-Strips were intact.  There was oozing or  drainage, bleeding, no hematoma.   The patient will be discharged home.   Chest x-ray revealed normal results,  it was done this morning and result is still pending.   DISCHARGE MEDICATIONS:  1. Zoloft 150 mg daily.  2. Aspirin 81 mg daily, patient will start tomorrow.  3. Solar 10 mg daily.  4. Toprol XL 25 mg daily.  5. Zetia 10 mg daily.  6. Crestor 20 mg daily.  7. Vicodin 5/500 mg 1-2 p.o. every 6-8 hours p.r.n.  8. HCTZ 12.5 mg daily.   DISCHARGE DIET:  Low salt, low cholesterol.   DISCHARGE INSTRUCTIONS:  No driving, no lifting greater than 5 pounds for 7  days or until seen by Dr. Clarene Duke.  The patient was instructed not to wet  incision site and keep it dry for up until seen in the office.  Do not peel  off Steri-Strips, let them roll off themselves.   FOLLOWUP:  Dr. Clarene Duke will see patient for a pacer site checkup, office will  call to schedule that appointment.      Raymon Mutton, P.A.      Darlin Priestly, MD  Electronically Signed    MK/MEDQ  D:  09/14/2006  T:  09/14/2006  Job:  [id number]   cc:   Thereasa Solo. Little, M.D.  [facility]

## 2011-04-19 ENCOUNTER — Other Ambulatory Visit: Payer: Self-pay

## 2011-04-19 MED ORDER — ALPRAZOLAM 0.5 MG PO TABS: 0.5000 mg | ORAL_TABLET | Freq: Three times a day (TID) | ORAL | Status: DC | PRN

## 2011-04-19 NOTE — Telephone Encounter (Signed)
Last seen 12/09/10 filled 03/29/11 please advise    KP

## 2011-04-19 NOTE — Telephone Encounter (Signed)
Hard copy faxed. 

## 2011-04-19 NOTE — Telephone Encounter (Signed)
Call from patient and she only got 60 pills---I called to pharmacy and corrected RX to quantity of 90 since this is what Dr.Lowne has always prescribed.      KP

## 2011-04-23 ENCOUNTER — Other Ambulatory Visit: Payer: Self-pay

## 2011-04-23 MED ORDER — CLONAZEPAM 0.5 MG PO TABS: 0.5000 mg | ORAL_TABLET | Freq: Two times a day (BID) | ORAL | Status: DC

## 2011-05-17 ENCOUNTER — Other Ambulatory Visit: Payer: Self-pay

## 2011-05-17 MED ORDER — ALPRAZOLAM 0.5 MG PO TABS: 0.5000 mg | ORAL_TABLET | Freq: Three times a day (TID) | ORAL | Status: DC | PRN

## 2011-05-17 NOTE — Telephone Encounter (Signed)
Rx faxed.    KP 

## 2011-05-17 NOTE — Telephone Encounter (Signed)
Last seen 12/09/10 and filled 04/19/11 please advise     KP

## 2011-05-24 ENCOUNTER — Other Ambulatory Visit: Payer: Self-pay | Admitting: Family Medicine

## 2011-05-24 MED ORDER — CLONAZEPAM 0.5 MG PO TABS: 0.5000 mg | ORAL_TABLET | Freq: Two times a day (BID) | ORAL | Status: DC

## 2011-05-24 NOTE — Telephone Encounter (Signed)
Faxed.   KP 

## 2011-05-24 NOTE — Telephone Encounter (Signed)
Last seen 12/09/10 and filled 04/23/11 Please advise     KP

## 2011-05-27 ENCOUNTER — Other Ambulatory Visit: Payer: Self-pay | Admitting: Family Medicine

## 2011-05-27 NOTE — Telephone Encounter (Signed)
Rx was sent 12/11/10 #90 with 3 refills. I tried to contact the pharmacy but the line disconnected 3 times...   KP

## 2011-05-28 ENCOUNTER — Other Ambulatory Visit: Payer: Self-pay

## 2011-05-28 MED ORDER — CITALOPRAM HYDROBROMIDE 20 MG PO TABS: 20.0000 mg | ORAL_TABLET | Freq: Every day | ORAL | Status: DC

## 2011-05-28 NOTE — Telephone Encounter (Signed)
Spoke with Anna Bible at the pharmacy and Rx that was faxed in jan was not rcv'd---- Refaxed with the same direction     KP

## 2011-06-16 ENCOUNTER — Other Ambulatory Visit: Payer: Self-pay | Admitting: Family Medicine

## 2011-06-17 MED ORDER — ALPRAZOLAM 0.5 MG PO TABS: 0.5000 mg | ORAL_TABLET | Freq: Three times a day (TID) | ORAL | Status: DC | PRN

## 2011-06-17 NOTE — Telephone Encounter (Signed)
Rx faxed to pharmacy  

## 2011-06-17 NOTE — Telephone Encounter (Signed)
Last seen 12/09/10 and filled 05/17/11 please advise     KP

## 2011-06-18 ENCOUNTER — Emergency Department (HOSPITAL_COMMUNITY): Payer: PRIVATE HEALTH INSURANCE

## 2011-06-18 ENCOUNTER — Emergency Department (HOSPITAL_COMMUNITY)
Admission: EM | Admit: 2011-06-18 | Discharge: 2011-06-18 | Disposition: A | Discharge status: Home / Self Care | Payer: PRIVATE HEALTH INSURANCE | Attending: Emergency Medicine | Admitting: Emergency Medicine

## 2011-06-18 DIAGNOSIS — Z9889 Other specified postprocedural states: Secondary | ICD-10-CM | POA: Insufficient documentation

## 2011-06-18 DIAGNOSIS — I10 Essential (primary) hypertension: Secondary | ICD-10-CM | POA: Insufficient documentation

## 2011-06-18 DIAGNOSIS — Z7982 Long term (current) use of aspirin: Secondary | ICD-10-CM | POA: Insufficient documentation

## 2011-06-18 DIAGNOSIS — R072 Precordial pain: Secondary | ICD-10-CM | POA: Insufficient documentation

## 2011-06-18 DIAGNOSIS — E119 Type 2 diabetes mellitus without complications: Secondary | ICD-10-CM | POA: Insufficient documentation

## 2011-06-18 DIAGNOSIS — Z950 Presence of cardiac pacemaker: Secondary | ICD-10-CM | POA: Insufficient documentation

## 2011-06-18 DIAGNOSIS — Z79899 Other long term (current) drug therapy: Secondary | ICD-10-CM | POA: Insufficient documentation

## 2011-06-18 LAB — COMPREHENSIVE METABOLIC PANEL
Albumin: 4 g/dL (ref 3.5–5.2)
Alkaline Phosphatase: 70 U/L (ref 39–117)
BUN: 20 mg/dL (ref 6–23)
CO2: 27 mEq/L (ref 19–32)
Chloride: 102 mEq/L (ref 96–112)
Creatinine, Ser: 0.78 mg/dL (ref 0.50–1.10)
GFR calc Af Amer: >60 mL/min (ref >60)
GFR calc non Af Amer: >60 mL/min (ref >60)
Glucose, Bld: 100 mg/dL — ABNORMAL HIGH (ref 70–99)
Potassium: 3.9 mEq/L (ref 3.5–5.1)
Sodium: 140 mEq/L (ref 135–145)
Total Bilirubin: 0.4 mg/dL (ref 0.3–1.2)

## 2011-06-18 LAB — DIFFERENTIAL
Basophils Absolute: 0 10*3/uL (ref 0.0–0.1)
Lymphocytes Relative: 30 % (ref 12–46)
Monocytes Absolute: 0.5 10*3/uL (ref 0.1–1.0)
Neutro Abs: 3.2 10*3/uL (ref 1.7–7.7)

## 2011-06-18 LAB — CK TOTAL AND CKMB (NOT AT ARMC)
CK, MB: 3.2 ng/mL (ref 0.3–4.0)
Relative Index: 2.6 — ABNORMAL HIGH (ref 0.0–2.5)
Total CK: 121 U/L (ref 7–177)

## 2011-06-18 LAB — CBC
HCT: 38.7 % (ref 36.0–46.0)
Hemoglobin: 13.8 g/dL (ref 12.0–15.0)
RBC: 4.15 MIL/uL (ref 3.87–5.11)
WBC: 5.3 10*3/uL (ref 4.0–10.5)

## 2011-06-18 LAB — TROPONIN I: Troponin I: <0.3 ng/mL (ref <0.30)

## 2011-06-18 MED ORDER — IOHEXOL 350 MG/ML SOLN
100.0000 mL | Freq: Once | INTRAVENOUS | Status: AC | PRN
  Administered 2011-06-18: 100 mL via INTRAVENOUS

## 2011-06-23 ENCOUNTER — Other Ambulatory Visit: Payer: Self-pay | Admitting: Family Medicine

## 2011-06-23 MED ORDER — CLONAZEPAM 0.5 MG PO TABS: 0.5000 mg | ORAL_TABLET | Freq: Two times a day (BID) | ORAL | Status: DC

## 2011-06-23 NOTE — Telephone Encounter (Signed)
Rx faxed.    KP 

## 2011-06-23 NOTE — Telephone Encounter (Signed)
Last seen 12/09/10 and filled 05/24/11 please advise    KP

## 2011-07-16 ENCOUNTER — Other Ambulatory Visit: Payer: Self-pay | Admitting: Family Medicine

## 2011-07-16 MED ORDER — ALPRAZOLAM 0.5 MG PO TABS: 0.5000 mg | ORAL_TABLET | Freq: Three times a day (TID) | ORAL | Status: DC | PRN

## 2011-07-16 NOTE — Telephone Encounter (Signed)
Refill x1 

## 2011-07-16 NOTE — Telephone Encounter (Signed)
Last filled 06-17-11 #90, last ov 11-06-10

## 2011-07-20 ENCOUNTER — Other Ambulatory Visit: Payer: Self-pay | Admitting: Family Medicine

## 2011-07-20 MED ORDER — CLONAZEPAM 0.5 MG PO TABS: 0.5000 mg | ORAL_TABLET | Freq: Two times a day (BID) | ORAL | Status: DC

## 2011-07-20 NOTE — Telephone Encounter (Signed)
Faxed.   KP 

## 2011-07-20 NOTE — Telephone Encounter (Signed)
Last seen 12/09/10 and filled 06/23/11 # 60-- she stated he husband fell and broke his hip and is in assisted living and she has been running around and will not be able to make and appointment until September. Please advise       KP

## 2011-08-13 ENCOUNTER — Other Ambulatory Visit: Payer: Self-pay | Admitting: Family Medicine

## 2011-08-13 MED ORDER — CLONAZEPAM 0.5 MG PO TABS: 0.5000 mg | ORAL_TABLET | Freq: Two times a day (BID) | ORAL | Status: DC

## 2011-08-13 NOTE — Telephone Encounter (Signed)
Faxed.   KP 

## 2011-08-13 NOTE — Telephone Encounter (Signed)
Last seen 12/09/10 and filled 07/20/11 please advise    KP

## 2011-09-09 LAB — CBC
HCT: 25.8 — ABNORMAL LOW
HCT: 27.3 — ABNORMAL LOW
HCT: 27.8 — ABNORMAL LOW
HCT: 28.7 — ABNORMAL LOW
HCT: 28.9 — ABNORMAL LOW
HCT: 29.8 — ABNORMAL LOW
HCT: 34.3 — ABNORMAL LOW
Hemoglobin: 10.1 — ABNORMAL LOW
Hemoglobin: 10.1 — ABNORMAL LOW
Hemoglobin: 11.8 — ABNORMAL LOW
Hemoglobin: 9 — ABNORMAL LOW
Hemoglobin: 9.4 — ABNORMAL LOW
Hemoglobin: 9.5 — ABNORMAL LOW
Hemoglobin: 9.8 — ABNORMAL LOW
MCHC: 33.8
MCHC: 34
MCHC: 34
MCHC: 34.3
MCHC: 34.4
MCHC: 34.7
MCHC: 35.1
MCV: 93.3
MCV: 93.4
MCV: 94.3
MCV: 94.5
MCV: 94.5
MCV: 94.6
MCV: 94.9
MCV: 95
Platelets: 102 — ABNORMAL LOW
Platelets: 107 — ABNORMAL LOW
Platelets: 107 — ABNORMAL LOW
Platelets: 133 — ABNORMAL LOW
Platelets: 158
Platelets: 81 — ABNORMAL LOW
Platelets: 81 — ABNORMAL LOW
Platelets: 97 — ABNORMAL LOW
RBC: 2.77 — ABNORMAL LOW
RBC: 2.89 — ABNORMAL LOW
RBC: 2.94 — ABNORMAL LOW
RBC: 3.05 — ABNORMAL LOW
RBC: 3.08 — ABNORMAL LOW
RBC: 3.15 — ABNORMAL LOW
RBC: 3.64 — ABNORMAL LOW
RDW: 14.4 — ABNORMAL HIGH
RDW: 14.8 — ABNORMAL HIGH
RDW: 14.9 — ABNORMAL HIGH
RDW: 15 — ABNORMAL HIGH
RDW: 15 — ABNORMAL HIGH
RDW: 15.2 — ABNORMAL HIGH
RDW: 15.4 — ABNORMAL HIGH
WBC: 10.7 — ABNORMAL HIGH
WBC: 11.3 — ABNORMAL HIGH
WBC: 11.6 — ABNORMAL HIGH
WBC: 5.8
WBC: 6.4
WBC: 6.8
WBC: 7.2
WBC: 9.9

## 2011-09-09 LAB — POCT I-STAT 4, (NA,K, GLUC, HGB,HCT)
Glucose, Bld: 142 — ABNORMAL HIGH
Glucose, Bld: 149 — ABNORMAL HIGH
Glucose, Bld: 159 — ABNORMAL HIGH
Glucose, Bld: 218 — ABNORMAL HIGH
HCT: 25 — ABNORMAL LOW
HCT: 26 — ABNORMAL LOW
HCT: 30 — ABNORMAL LOW
HCT: 35 — ABNORMAL LOW
Hemoglobin: 10.2 — ABNORMAL LOW
Hemoglobin: 10.2 — ABNORMAL LOW
Hemoglobin: 11.2 — ABNORMAL LOW
Hemoglobin: 11.9 — ABNORMAL LOW
Hemoglobin: 8.8 — ABNORMAL LOW
Operator id: 3291
Operator id: 3291
Operator id: 3291
Operator id: 3291
Operator id: 3291
Potassium: 3.5
Potassium: 3.8
Potassium: 3.8
Potassium: 4.2
Potassium: 4.2
Sodium: 134 — ABNORMAL LOW
Sodium: 139
Sodium: 140
Sodium: 141

## 2011-09-09 LAB — URINALYSIS, ROUTINE W REFLEX MICROSCOPIC
Bilirubin Urine: NEGATIVE
Glucose, UA: NEGATIVE
Hgb urine dipstick: NEGATIVE
Ketones, ur: NEGATIVE
Nitrite: NEGATIVE
Protein, ur: NEGATIVE
Specific Gravity, Urine: 1.039 — ABNORMAL HIGH
Urobilinogen, UA: 0.2
pH: 5.5

## 2011-09-09 LAB — POCT I-STAT 3, VENOUS BLOOD GAS (G3P V)
Acid-base deficit: 6 — ABNORMAL HIGH
Bicarbonate: 20.5
Bicarbonate: 23.6
O2 Saturation: 84
Operator id: 3291
Operator id: 3291
pCO2, Ven: 45.6
pCO2, Ven: 50.6 — ABNORMAL HIGH
pH, Ven: 7.276
pO2, Ven: 119 — ABNORMAL HIGH
pO2, Ven: 56 — ABNORMAL HIGH

## 2011-09-09 LAB — CROSSMATCH

## 2011-09-09 LAB — BASIC METABOLIC PANEL
BUN: 10
BUN: 12
BUN: 15
BUN: 17
CO2: 25
CO2: 27
CO2: 28
CO2: 30
Calcium: 6.9 — ABNORMAL LOW
Calcium: 7.2 — ABNORMAL LOW
Calcium: 7.8 — ABNORMAL LOW
Calcium: 8.1 — ABNORMAL LOW
Chloride: 105
Chloride: 105
Chloride: 106
Chloride: 109
Creatinine, Ser: 0.66
Creatinine, Ser: 0.67
Creatinine, Ser: 0.76
Creatinine, Ser: 0.91
GFR calc Af Amer: >60
GFR calc Af Amer: >60
GFR calc Af Amer: >60
GFR calc Af Amer: >60
GFR calc non Af Amer: >60
GFR calc non Af Amer: >60
GFR calc non Af Amer: >60
GFR calc non Af Amer: >60
Glucose, Bld: 104 — ABNORMAL HIGH
Glucose, Bld: 116 — ABNORMAL HIGH
Glucose, Bld: 129 — ABNORMAL HIGH
Glucose, Bld: 176 — ABNORMAL HIGH
Potassium: 3.7
Potassium: 3.9
Potassium: 4
Potassium: 4.2
Sodium: 139
Sodium: 140
Sodium: 141
Sodium: 142

## 2011-09-09 LAB — I-STAT 8, (EC8 V) (CONVERTED LAB)
Acid-Base Excess: 1
Operator id: 271091
Potassium: 4.2
TCO2: 27
pCO2, Ven: 42.3 — ABNORMAL LOW
pH, Ven: 7.397 — ABNORMAL HIGH

## 2011-09-09 LAB — APTT: aPTT: 31

## 2011-09-09 LAB — HEMOGLOBIN A1C
Hgb A1c MFr Bld: 7 — ABNORMAL HIGH
Mean Plasma Glucose: 172

## 2011-09-09 LAB — DIFFERENTIAL
Eosinophils Relative: 2
Lymphocytes Relative: 24
Lymphs Abs: 1.5
Neutrophils Relative %: 66

## 2011-09-09 LAB — POCT I-STAT 3, ART BLOOD GAS (G3+)
Acid-base deficit: 3 — ABNORMAL HIGH
Bicarbonate: 20.7
Bicarbonate: 20.8
Bicarbonate: 24.4 — ABNORMAL HIGH
O2 Saturation: 100
O2 Saturation: 90
O2 Saturation: 94
TCO2: 22
TCO2: 22
TCO2: 23
TCO2: 26
pCO2 arterial: 36.9
pCO2 arterial: 39.5
pCO2 arterial: 41.8
pCO2 arterial: 51.3 — ABNORMAL HIGH
pH, Arterial: 7.3 — ABNORMAL LOW
pH, Arterial: 7.306 — ABNORMAL LOW
pH, Arterial: 7.356
pH, Arterial: 7.412 — ABNORMAL HIGH
pO2, Arterial: 62 — ABNORMAL LOW
pO2, Arterial: 73 — ABNORMAL LOW

## 2011-09-09 LAB — CK TOTAL AND CKMB (NOT AT ARMC)
CK, MB: 5 — ABNORMAL HIGH
Total CK: 170

## 2011-09-09 LAB — MAGNESIUM
Magnesium: 2.1
Magnesium: 2.1
Magnesium: 2.4

## 2011-09-09 LAB — PROTIME-INR
INR: 1
INR: 1.3
Prothrombin Time: 12.9
Prothrombin Time: 16 — ABNORMAL HIGH

## 2011-09-09 LAB — COMPREHENSIVE METABOLIC PANEL
AST: 29
CO2: 25
Calcium: 9.5
Creatinine, Ser: 0.7
GFR calc Af Amer: >60
GFR calc non Af Amer: >60
Total Protein: 6.6

## 2011-09-09 LAB — PREPARE FRESH FROZEN PLASMA

## 2011-09-09 LAB — CREATININE, SERUM
Creatinine, Ser: 0.85
Creatinine, Ser: 0.97
GFR calc Af Amer: >60
GFR calc Af Amer: >60
GFR calc non Af Amer: 59 — ABNORMAL LOW
GFR calc non Af Amer: >60

## 2011-09-09 LAB — URINE MICROSCOPIC-ADD ON

## 2011-09-09 LAB — CARDIAC PANEL(CRET KIN+CKTOT+MB+TROPI)
CK, MB: 99.8 — ABNORMAL HIGH
Relative Index: 2.6 — ABNORMAL HIGH
Total CK: 3846 — ABNORMAL HIGH
Troponin I: 8.11

## 2011-09-09 LAB — ABO/RH: ABO/RH(D): O NEG

## 2011-09-09 LAB — I-STAT EC8
Acid-base deficit: 4 — ABNORMAL HIGH
Bicarbonate: 21.5
Glucose, Bld: 125 — ABNORMAL HIGH
Potassium: 3.7
Sodium: 143
TCO2: 23
pH, Arterial: 7.342 — ABNORMAL LOW

## 2011-09-09 LAB — D-DIMER, QUANTITATIVE: D-Dimer, Quant: 0.49 — ABNORMAL HIGH

## 2011-09-09 LAB — PREPARE PLATELET PHERESIS

## 2011-09-10 ENCOUNTER — Other Ambulatory Visit: Payer: Self-pay | Admitting: Family Medicine

## 2011-09-10 NOTE — Telephone Encounter (Signed)
Ok to refill x 1  

## 2011-09-10 NOTE — Telephone Encounter (Signed)
Please advise if ok to refill Klonopin 0.5. Patient last seen 11/06/10 CPX  and medication refilled 08/13/11 #60/0rf

## 2011-09-11 ENCOUNTER — Other Ambulatory Visit: Payer: Self-pay | Admitting: Family Medicine

## 2011-09-13 ENCOUNTER — Encounter: Payer: Self-pay | Admitting: Family Medicine

## 2011-09-13 ENCOUNTER — Ambulatory Visit (INDEPENDENT_AMBULATORY_CARE_PROVIDER_SITE_OTHER): Payer: PRIVATE HEALTH INSURANCE | Admitting: Family Medicine

## 2011-09-13 ENCOUNTER — Telehealth: Payer: Self-pay | Admitting: Family Medicine

## 2011-09-13 DIAGNOSIS — I10 Essential (primary) hypertension: Secondary | ICD-10-CM

## 2011-09-13 DIAGNOSIS — E119 Type 2 diabetes mellitus without complications: Secondary | ICD-10-CM

## 2011-09-13 DIAGNOSIS — Z23 Encounter for immunization: Secondary | ICD-10-CM

## 2011-09-13 DIAGNOSIS — F341 Dysthymic disorder: Secondary | ICD-10-CM

## 2011-09-13 DIAGNOSIS — E785 Hyperlipidemia, unspecified: Secondary | ICD-10-CM

## 2011-09-13 DIAGNOSIS — F418 Other specified anxiety disorders: Secondary | ICD-10-CM

## 2011-09-13 MED ORDER — SITAGLIPTIN PHOSPHATE 100 MG PO TABS: 100.0000 mg | ORAL_TABLET | Freq: Every day | ORAL | Status: DC

## 2011-09-13 MED ORDER — ROSUVASTATIN CALCIUM 20 MG PO TABS: 20.0000 mg | ORAL_TABLET | Freq: Every day | ORAL | Status: DC

## 2011-09-13 MED ORDER — ALPRAZOLAM 0.5 MG PO TABS: 0.5000 mg | ORAL_TABLET | Freq: Three times a day (TID) | ORAL | Status: DC | PRN

## 2011-09-13 MED ORDER — CLONAZEPAM 0.5 MG PO TABS: 0.5000 mg | ORAL_TABLET | Freq: Two times a day (BID) | ORAL | Status: DC

## 2011-09-13 NOTE — Patient Instructions (Signed)
Diabetes Meal Planning Guide The diabetes meal planning guide is a tool to help you plan your meals and snacks. It is important for people with diabetes to manage their blood sugar levels. Choosing the right foods and the right amounts throughout your day will help control your blood sugar. Eating right can even help you improve your blood pressure and reach or maintain a healthy weight. CARBOHYDRATE COUNTING MADE EASY When you eat carbohydrates, they turn to sugar (glucose). This raises your blood sugar level. Counting carbohydrates can help you control this level so you feel better. When you plan your meals by counting carbohydrates, you can have more flexibility in what you eat and balance your medicine with your food intake. Carbohydrate counting simply means adding up the total amount of carbohydrate grams (g) in your meals or snacks. Try to eat about the same amount at each meal. Foods with carbohydrates are listed below. Each portion below is 1 carbohydrate serving or 15 grams of carbohydrates. Ask your dietician how many grams of carbohydrates you should eat at each meal or snack. Grains and Starches 1 slice bread 1/2 English muffin or hotdog/hamburger bun 3/4 cup cold cereal (unsweetened) 1/3 cup cooked pasta or rice 1/2 cup starchy vegetables (corn, potatoes, peas, beans, winter squash) 1 tortilla (6 inches) 1/4 bagel 1 waffle or pancake (size of a CD) 1/2 cup cooked cereal 4 to 6 small crackers *Whole grain is recommended Fruit 1 cup fresh unsweetened berries, melon, papaya, pineapple 1 small fresh fruit 1/2 banana or mango 1/2 cup fruit juice (4 ounces unsweetened) 1/2 cup canned fruit in natural juice or water 2 tablespoons dried fruit 12 to 15 grapes or cherries Milk and Yogurt 1 cup fat-free or 1% milk 1 cup soy milk 6 ounces light yogurt with sugar-free sweetener 6 ounces low-fat soy yogurt 6 ounces plain yogurt Vegetables 1 cup raw or 1/2 cup cooked is counted as 0  carbohydrates or a "free" food. If you eat 3 or more servings at one meal, count them as 1 carbohydrate serving. Other Carbohydrates 3/4 ounces chips or pretzels 1/2 cup ice cream or frozen yogurt 1/4 cup sherbet or sorbet 2 inch square cake, no frosting 1 tablespoon honey, sugar, jam, jelly, or syrup 2 small cookies 3 squares of graham crackers 3 cups popcorn 6 crackers 1 cup broth-based soup Count 1 cup casserole or other mixed foods as 2 carbohydrate servings. Foods with less than 20 calories in a serving may be counted as 0 carbohydrates or a "free" food. You may want to purchase a book or computer software that lists the carbohydrate gram counts of different foods. In addition, the nutrition facts panel on the labels of the foods you eat are a good source of this information. The label will tell you how big the serving size is and the total number of carbohydrate grams you will be eating per serving. Divide this number by 15 to obtain the number of carbohydrate servings in a portion. Remember: 1 carbohydrate serving equals 15 grams of carbohydrate. SERVING SIZES Measuring foods and serving sizes helps you make sure you are getting the right amount of food. The list below tells how big or small some common serving sizes are.  1 ounce (oz) of cheese.................................4 stacked dice.   2 to 3 oz cooked meat..................................Deck of cards.   1 teaspoon (tsp)............................................Tip of little finger.   1 tablespoon (tbs).........................................Thumb.   2 tbs.............................................................Golf ball.    cup...........................................................Half of a fist.   1 cup............................................................A fist.    SAMPLE DIABETES MEAL PLAN Below is a sample meal plan that includes foods from the grain and starches, dairy, vegetable, fruit, and  meat groups. A dietician can individualize a meal plan to fit your calorie needs and tell you the number of servings needed from each food group. However, controlling the total amount of carbohydrates in your meal or snack is more important than making sure you include all of the food groups at every meal. You may interchange carbohydrate containing foods (dairy, starches, and fruits). The meal plan below is an example of a 2000 calorie diet using carbohydrate counting. This meal plan has 17 carbohydrate servings (carb choices). Breakfast 1 cup oatmeal (2 carb choices) 3/4 cup light yogurt (1 carb choice) 1 cup blueberries (1 carb choice) 1/4 cup almonds  Snack 1 large apple (2 carb choices) 1 low-fat string cheese stick  Lunch Chicken breast salad:  1 cup spinach   1/4 cup chopped tomatoes   2 oz chicken breast, sliced   2 tbs low-fat Italian dressing  12 whole-wheat crackers (2 carb choices) 12 to 15 grapes (1 carb choice) 1 cup low-fat milk (1 carb choice)  Snack 1 cup carrots 1/2 cup hummus (1 carb choice)  Dinner 3 oz broiled salmon 1 cup brown rice (3 carb choices)  Snack 1 1/2 cups steamed broccoli (1 carb choice) drizzled with 1 tsp olive oil and lemon juice 1 cup light pudding (2 carb choices)  DIABETES MEAL PLANNING WORKSHEET Your dietician can use this worksheet to help you decide how many servings of foods and what types of foods are right for you.  Breakfast Food Group and Servings Carb Choices Grain/Starches _______________________________________ Dairy ______________________________________________ Vegetable _______________________________________ Fruit _______________________________________________ Meat _______________________________________________ Fat _____________________________________________ Lunch Food Group and Servings Carb Choices Grain/Starches ________________________________________ Dairy _______________________________________________ Fruit  ________________________________________________ Meat ________________________________________________ Fat _____________________________________________ Dinner Food Group and Servings Carb Choices Grain/Starches ________________________________________ Dairy _______________________________________________ Fruit ________________________________________________ Meat ________________________________________________ Fat _____________________________________________ Snacks Food Group and Servings Carb Choices Grain/Starches ________________________________________ Dairy _______________________________________________ Vegetable ________________________________________ Fruit ________________________________________________ Meat ________________________________________________ Fat _____________________________________________ Daily Totals Starches _________________________ Vegetable __________________________ Fruit ______________________________ Dairy ______________________________ Meat ______________________________ Fat ________________________________  Document Released: 08/12/2005 Document Re-Released: 05/05/2010 [employer] Patient Information 2011 [employer]. 

## 2011-09-13 NOTE — Telephone Encounter (Signed)
Rx done and Faxed to pharmacy

## 2011-09-13 NOTE — Telephone Encounter (Signed)
Rf req for xanax 0.5mg  tid # 90. Ok to Rf?

## 2011-09-13 NOTE — Progress Notes (Signed)
  Subjective:    Patient ID: [name], female    DOB: September 07, 1946, 65 y.o.   MRN: [id number]  HPI Pt here for f/u and med refills.   HYPERTENSION Disease Monitoring Blood pressure range- 111/69 --132/84 Chest pain- no      Dyspnea- no Medications Compliance- good Lightheadedness- no   Edema- no   DIABETES Disease Monitoring Blood Sugar ranges-120-140 Polyuria- no New Visual problems- no Medications Compliance- good Hypoglycemic symptoms- no   HYPERLIPIDEMIA Disease Monitoring See symptoms for Hypertension Medications Compliance- good RUQ pain- no  Muscle aches- no  ROS See HPI above   PMH Smoking Status noted     Review of Systems As above    Objective:   Physical Exam  Constitutional: She is oriented to person, place, and time. She appears well-developed and well-nourished.  Cardiovascular: Normal rate and regular rhythm.   Murmur heard. Pulmonary/Chest: Effort normal and breath sounds normal. No respiratory distress. She has no wheezes. She has no rales. She exhibits no tenderness.  Musculoskeletal: Normal range of motion.  Neurological: She is alert and oriented to person, place, and time.  Psychiatric: She has a normal mood and affect. Her behavior is normal. Judgment and thought content normal.          Assessment & Plan:  1 HTN-- controlled 2 DM-- con't meds,  3. Hyperlipidemia-- check labs,  Cont meds  4 depression/ anxiety---  Doing well with celexa 40 mg

## 2011-09-13 NOTE — Telephone Encounter (Signed)
i just gave pt refill in office

## 2011-09-14 LAB — LDL CHOLESTEROL, DIRECT: Direct LDL: 217.7 mg/dL

## 2011-09-14 LAB — LIPID PANEL
Cholesterol: 309 mg/dL — ABNORMAL HIGH (ref 0–200)
Triglycerides: 331 mg/dL — ABNORMAL HIGH (ref 0.0–149.0)

## 2011-09-14 LAB — BASIC METABOLIC PANEL
CO2: 28 mEq/L (ref 19–32)
Calcium: 9.8 mg/dL (ref 8.4–10.5)
Creatinine, Ser: 0.8 mg/dL (ref 0.4–1.2)
Glucose, Bld: 143 mg/dL — ABNORMAL HIGH (ref 70–99)

## 2011-09-14 LAB — HEPATIC FUNCTION PANEL
ALT: 16 U/L (ref 0–35)
Albumin: 4.4 g/dL (ref 3.5–5.2)
Total Protein: 7.8 g/dL (ref 6.0–8.3)

## 2011-09-21 ENCOUNTER — Encounter: Payer: Self-pay | Admitting: *Deleted

## 2011-09-21 ENCOUNTER — Telehealth: Payer: Self-pay | Admitting: *Deleted

## 2011-09-21 NOTE — Telephone Encounter (Signed)
Message copied by Verdene Rio on Tue Sep 21, 2011  8:11 AM ------      Message from: Lelon Perla      Created: Tue Sep 14, 2011  4:32 PM       Cholesterol--- LDL goal < 100  HDL >40  TG < 150.  Diet and exercise will increase HDL and decrease LDL and TG.  Fish,  Fish Oil, Flaxseed oil will also help increase the HDL and decrease Triglycerides.  Cont meds---add fenofibrate 160 mg #30  1 po qd , 2 refills.              DM is controlled.       Recheck labs in 3 months.----  250.00 272.4  Lipid, hep, bmp, hgba1c, microalbumin

## 2011-09-21 NOTE — Telephone Encounter (Signed)
Discussed with patient and she voiced understanding and agreed    KP 

## 2011-09-21 NOTE — Telephone Encounter (Signed)
We can wait to see next labs

## 2011-09-21 NOTE — Telephone Encounter (Signed)
Pt states that she has not taking the crestor since July due to issue with her husband. Pt indicated that she just started back on med in October. Please advise if Pt needs to still start fenofibrate

## 2011-11-09 ENCOUNTER — Other Ambulatory Visit: Payer: Self-pay | Admitting: Family Medicine

## 2011-11-09 DIAGNOSIS — F418 Other specified anxiety disorders: Secondary | ICD-10-CM

## 2011-11-09 NOTE — Telephone Encounter (Signed)
Last seen and filled 09/13/11. Please advise    KP

## 2011-11-10 MED ORDER — CLONAZEPAM 0.5 MG PO TABS: 0.5000 mg | ORAL_TABLET | Freq: Two times a day (BID) | ORAL | Status: DC

## 2011-11-10 NOTE — Telephone Encounter (Signed)
Faxed.   KP 

## 2012-01-10 ENCOUNTER — Telehealth: Payer: Self-pay | Admitting: Family Medicine

## 2012-01-10 DIAGNOSIS — F418 Other specified anxiety disorders: Secondary | ICD-10-CM

## 2012-01-10 MED ORDER — ALPRAZOLAM 0.5 MG PO TABS: 0.5000 mg | ORAL_TABLET | Freq: Three times a day (TID) | ORAL | Status: DC | PRN

## 2012-01-10 MED ORDER — CLONAZEPAM 0.5 MG PO TABS: 0.5000 mg | ORAL_TABLET | Freq: Two times a day (BID) | ORAL | Status: DC

## 2012-01-10 NOTE — Telephone Encounter (Signed)
Faxed.   KP 

## 2012-01-10 NOTE — Telephone Encounter (Signed)
Pt should not still need both klonopin and xanax- refil both x 1 - pt should f/u for anxiety

## 2012-01-10 NOTE — Telephone Encounter (Signed)
Last seen 09/13/11 and Xanax filled 09/13/11 #90 with 3 refills and klonopin 11/09/11 # 60. Please advise    KP

## 2012-02-03 ENCOUNTER — Other Ambulatory Visit: Payer: Self-pay | Admitting: Cardiothoracic Surgery

## 2012-02-03 DIAGNOSIS — I7101 Dissection of thoracic aorta: Secondary | ICD-10-CM

## 2012-02-03 DIAGNOSIS — I71019 Dissection of thoracic aorta, unspecified: Secondary | ICD-10-CM

## 2012-02-09 ENCOUNTER — Other Ambulatory Visit: Payer: Self-pay

## 2012-02-09 DIAGNOSIS — F418 Other specified anxiety disorders: Secondary | ICD-10-CM

## 2012-02-09 MED ORDER — ALPRAZOLAM 0.5 MG PO TABS: 0.5000 mg | ORAL_TABLET | Freq: Three times a day (TID) | ORAL | Status: DC | PRN

## 2012-02-09 NOTE — Telephone Encounter (Signed)
Patient stated that her husband has a broken hip and he is getting in home therapy and she does not know when she can make an apt.    KP

## 2012-02-09 NOTE — Telephone Encounter (Signed)
Msg left to call the office     KP 

## 2012-02-10 MED ORDER — ALPRAZOLAM 0.5 MG PO TABS: 0.5000 mg | ORAL_TABLET | Freq: Three times a day (TID) | ORAL | Status: DC | PRN

## 2012-02-10 NOTE — Telephone Encounter (Signed)
Addended by: Arnette Norris on: 02/10/2012 08:11 AM   Modules accepted: Orders

## 2012-02-11 ENCOUNTER — Other Ambulatory Visit: Payer: Self-pay | Admitting: Gastroenterology

## 2012-02-11 ENCOUNTER — Telehealth: Payer: Self-pay | Admitting: Family Medicine

## 2012-02-11 NOTE — Telephone Encounter (Signed)
Rx faxed yesterday and patient has been made aware to check with the pharmacy.    KP

## 2012-02-11 NOTE — Telephone Encounter (Signed)
rx refill for  XANAX 0.5 MG  Take 1 tablet (0.5 mg total) by mouth 3 (three) times daily as needed for sleep or anxiety Patient is down to 1 & she made an appointment to see Dr. Laury Axon next week just to follow up on meds. Currently she is having to take care of her husband that has had a fall & is under home health care or she would have scheduled a CPE. Can you call her in enough to get her through til her appointment [facility] [address]?  Patient ph# [phone]

## 2012-02-15 ENCOUNTER — Ambulatory Visit: Payer: PRIVATE HEALTH INSURANCE | Admitting: Family Medicine

## 2012-02-15 ENCOUNTER — Telehealth: Payer: Self-pay | Admitting: Family Medicine

## 2012-02-15 DIAGNOSIS — F418 Other specified anxiety disorders: Secondary | ICD-10-CM

## 2012-02-15 MED ORDER — CLONAZEPAM 0.5 MG PO TABS: 0.5000 mg | ORAL_TABLET | Freq: Two times a day (BID) | ORAL | Status: DC

## 2012-02-15 NOTE — Telephone Encounter (Signed)
spoke with [name] at [facility] and he stated that they xanax was received, I called patient and she stated she needed a refill on her Klonopin and not the Xanax. Klonopin was last filled 01/10/12 # 60 and she was seen 09/14/11 . Please advise     KP

## 2012-02-15 NOTE — Telephone Encounter (Signed)
Patient states her xanax is still not at the pharmacy and would like it re-sent. [facility] [address]

## 2012-02-15 NOTE — Telephone Encounter (Signed)
I advised Dr.Hopper that patient has already received the Xanax and he advised that I will only need to fill # 30 for the patient due to high risk of Sedation if the two medications are taken together. He advised me to make the patient aware not to take both medication and advised of the BEERS criteria. I discussed with patient and she stated she has an upcoming apt with Dr.Lowne anyway. I advised I still had to explain what the MD recommendations were and I will be faxing the Rx to the pharmacy. Patient did not say anything, I advised her to have a nice day. I discarded the Rx for 60 and sent in an Rx for 30 Klonopin to [facility] on [address].    KP

## 2012-02-15 NOTE — Telephone Encounter (Signed)
Ask pharmacy to hold the Xanax prescription. Clonazepam twice a day as needed, #60. Dr. Laury Axon can review the BEERS criteria with the patient because of age and increased risk of adverse effect taking Xanax and clonazepam.

## 2012-02-22 ENCOUNTER — Ambulatory Visit (INDEPENDENT_AMBULATORY_CARE_PROVIDER_SITE_OTHER): Payer: Medicare Other | Admitting: Family Medicine

## 2012-02-22 ENCOUNTER — Encounter: Payer: Self-pay | Admitting: Family Medicine

## 2012-02-22 VITALS — BP 120/70 | HR 74 | Temp 98.3°F | Wt 206.0 lb

## 2012-02-22 DIAGNOSIS — E119 Type 2 diabetes mellitus without complications: Secondary | ICD-10-CM | POA: Diagnosis not present

## 2012-02-22 DIAGNOSIS — F411 Generalized anxiety disorder: Secondary | ICD-10-CM

## 2012-02-22 DIAGNOSIS — E785 Hyperlipidemia, unspecified: Secondary | ICD-10-CM | POA: Diagnosis not present

## 2012-02-22 DIAGNOSIS — I10 Essential (primary) hypertension: Secondary | ICD-10-CM | POA: Diagnosis not present

## 2012-02-22 DIAGNOSIS — F341 Dysthymic disorder: Secondary | ICD-10-CM | POA: Diagnosis not present

## 2012-02-22 DIAGNOSIS — F418 Other specified anxiety disorders: Secondary | ICD-10-CM

## 2012-02-22 MED ORDER — CLONAZEPAM 0.5 MG PO TABS: 0.5000 mg | ORAL_TABLET | Freq: Two times a day (BID) | ORAL | Status: DC | PRN

## 2012-02-22 NOTE — Assessment & Plan Note (Signed)
Check labs con't meds 

## 2012-02-22 NOTE — Assessment & Plan Note (Signed)
Check labs  con't meds stable 

## 2012-02-22 NOTE — Assessment & Plan Note (Signed)
con't meds  Check labs 

## 2012-02-22 NOTE — Assessment & Plan Note (Signed)
Stable on current meds Pt does not take xanax and klonopin together

## 2012-02-22 NOTE — Patient Instructions (Signed)

## 2012-02-22 NOTE — Progress Notes (Signed)
  Subjective:    Patient ID: Lynder Parents, female    DOB: 07-Aug-1946, 66 y.o.   MRN: [id number]  HPI Pt here for f/u and refills.  Pt was only given 15 days of klonopin.   HYPERTENSION Disease Monitoring Blood pressure range-not checked Chest pain- no      Dyspnea- no Medications Compliance- good Lightheadedness- no   Edema- no   DIABETES Disease Monitoring Blood Sugar ranges-99-120 Polyuria- no New Visual problems- no Medications Compliance- good Hypoglycemic symptoms- no   HYPERLIPIDEMIA Disease Monitoring See symptoms for Hypertension Medications Compliance- good RUQ pain- no  Muscle aches- no  ROS See HPI above   PMH Smoking Status noted     Review of Systems    as above Objective:   Physical Exam  Constitutional: She is oriented to person, place, and time. She appears well-developed and well-nourished.  Cardiovascular: Normal rate.   Murmur heard. Pulmonary/Chest: Effort normal and breath sounds normal. No respiratory distress. She has no wheezes.  Musculoskeletal: She exhibits no edema.  Neurological: She is oriented to person, place, and time.  Psychiatric: She has a normal mood and affect. Her behavior is normal. Judgment and thought content normal.          Assessment & Plan:

## 2012-03-21 ENCOUNTER — Telehealth: Payer: Self-pay | Admitting: Family Medicine

## 2012-03-21 DIAGNOSIS — F418 Other specified anxiety disorders: Secondary | ICD-10-CM

## 2012-03-21 MED ORDER — ALPRAZOLAM 0.5 MG PO TABS: 0.5000 mg | ORAL_TABLET | Freq: Three times a day (TID) | ORAL | Status: DC | PRN

## 2012-03-21 NOTE — Telephone Encounter (Signed)
Faxed.   KP 

## 2012-03-21 NOTE — Telephone Encounter (Signed)
Refill: Alprazolam 0.5 mg tab

## 2012-03-21 NOTE — Telephone Encounter (Signed)
Last seen 02/22/12 and filled 02/10/12 # 90. please advise     KP

## 2012-03-21 NOTE — Telephone Encounter (Signed)
Ok for #90, no refills 

## 2012-03-23 ENCOUNTER — Other Ambulatory Visit: Payer: PRIVATE HEALTH INSURANCE

## 2012-03-23 ENCOUNTER — Ambulatory Visit: Payer: Medicare Other | Admitting: Cardiothoracic Surgery

## 2012-04-02 ENCOUNTER — Other Ambulatory Visit: Payer: Self-pay | Admitting: Family Medicine

## 2012-04-04 DIAGNOSIS — I71019 Dissection of thoracic aorta, unspecified: Secondary | ICD-10-CM | POA: Diagnosis not present

## 2012-04-04 DIAGNOSIS — I7101 Dissection of thoracic aorta: Secondary | ICD-10-CM | POA: Diagnosis not present

## 2012-04-06 ENCOUNTER — Ambulatory Visit (INDEPENDENT_AMBULATORY_CARE_PROVIDER_SITE_OTHER): Payer: Medicare Other | Admitting: Cardiothoracic Surgery

## 2012-04-06 ENCOUNTER — Encounter: Payer: Self-pay | Admitting: Cardiothoracic Surgery

## 2012-04-06 ENCOUNTER — Ambulatory Visit
Admission: RE | Admit: 2012-04-06 | Discharge: 2012-04-06 | Disposition: A | Discharge status: Home / Self Care | Payer: Medicare Other | Source: Ambulatory Visit | Attending: Cardiothoracic Surgery | Admitting: Cardiothoracic Surgery

## 2012-04-06 ENCOUNTER — Other Ambulatory Visit: Payer: Self-pay | Admitting: Family Medicine

## 2012-04-06 VITALS — BP 131/71 | HR 88 | Resp 20 | Ht 60.0 in | Wt 206.0 lb

## 2012-04-06 DIAGNOSIS — I71019 Dissection of thoracic aorta, unspecified: Secondary | ICD-10-CM

## 2012-04-06 DIAGNOSIS — I7101 Dissection of thoracic aorta: Secondary | ICD-10-CM | POA: Diagnosis not present

## 2012-04-06 DIAGNOSIS — J9819 Other pulmonary collapse: Secondary | ICD-10-CM | POA: Diagnosis not present

## 2012-04-06 MED ORDER — IOHEXOL 350 MG/ML SOLN
100.0000 mL | Freq: Once | INTRAVENOUS | Status: AC | PRN
  Administered 2012-04-06: 100 mL via INTRAVENOUS

## 2012-04-06 NOTE — Progress Notes (Signed)
[address]            [place] [address]          [phone]      [name] [facility] Medical Record #[id number] Date of Birth: January 09, 1946  Referring: Lelon Perla, DO Primary Care: Loreen Freud, DO, DO  Chief Complaint:    Chief Complaint  Patient presents with  . Follow-up    1 year f/u with Chest CTA    History of Present Illness:    Patient is status post replacement of the ascending aorta for acute aortic dissection in 2008. She's had no significant changes over the past year with exception she notes increasing stress related to illnesses of her husband.      Current Activity/ Functional Status: Patient is independent with mobility/ambulation, transfers, ADL's, IADL's.   Past Medical History  Diagnosis Date  . Anxiety   . Hyperlipidemia   . Hypertension   . Diabetes mellitus   . GERD (gastroesophageal reflux disease)   . Cancer     skin  . Kidney stones   . Diverticulosis   . Hemorrhoids   . Acute thoracic aortic dissection     Past Surgical History  Procedure Date  . Permanent pacemaker   . Abdominal aortic aneurysm repair   . Appendectomy   . Abdominal hysterectomy     Family History  Problem Relation Age of Onset  . Heart disease Father     mi  . Cancer Neg Hx     History   Social History  . Marital Status: Married    Spouse Name: N/A    Number of Children: N/A  . Years of Education: N/A   Occupational History  . Not on file.   Social History Main Topics  . Smoking status: Former Games developer  . Smokeless tobacco: Not on file  . Alcohol Use: No  . Drug Use: No  . Sexually Active:    Other Topics Concern  . Not on file   Social History Narrative  . No narrative on file    History  Smoking status  . Former Smoker  Smokeless tobacco  . Not on file    History  Alcohol Use No     Allergies  Allergen Reactions  . Sulfonamide Derivatives     Current Outpatient Prescriptions    Medication Sig Dispense Refill  . ALPRAZolam (XANAX) 0.5 MG tablet Take 1 tablet (0.5 mg total) by mouth 3 (three) times daily as needed for sleep or anxiety.  90 tablet  0  . amlodipine-olmesartan (AZOR) 10-20 MG per tablet Take 1 tablet by mouth daily.        [name] aspirin 325 MG tablet Take 325 mg by mouth daily.        . carvedilol (COREG) 12.5 MG tablet Take 12.5 mg by mouth 2 (two) times daily with a meal.        . citalopram (CELEXA) 40 MG tablet Take 40 mg by mouth daily.        . clonazePAM (KLONOPIN) 0.5 MG tablet Take 1 tablet (0.5 mg total) by mouth 2 (two) times daily as needed for anxiety.  60 tablet  1  . CRESTOR 20 MG tablet TAKE ONE TABLET BY MOUTH NIGHTLY AT BEDTIME  30 each  0  . furosemide (LASIX) 20 MG tablet Take 20 mg by mouth as needed.        [name]  hyoscyamine (LEVSIN, ANASPAZ) 0.125 MG tablet TAKE ONE TABLET BY MOUTH TWICE DAILY AS NEEDED  60 tablet  2  . JANUVIA 100 MG tablet TAKE ONE TABLET BY MOUTH ONE TIME DAILY  30 each  0  . JANUVIA 100 MG tablet TAKE ONE TABLET BY MOUTH ONE TIME DAILY  30 each  0  . lansoprazole (PREVACID) 15 MG capsule Take 15 mg by mouth as needed.        . saccharomyces boulardii (FLORASTOR) 250 MG capsule Take 250 mg by mouth. Once daily for two weeks       . Vitamin D, Ergocalciferol, (DRISDOL) 50000 UNITS CAPS Take 50,000 Units by mouth.         No current facility-administered medications for this visit.   Facility-Administered Medications Ordered in Other Visits  Medication Dose Route Frequency Provider Last Rate Last Dose  . iohexol (OMNIPAQUE) 350 MG/ML injection 100 mL  100 mL Intravenous Once PRN Medication Radiologist, MD   100 mL at 04/06/12 1421       Review of Systems:     Cardiac Review of Systems: Y or N  Chest Pain [  n  ]  Resting SOB [n   ] Exertional SOB  Cove.Etienne  ]  Orthopnea Milo.Brash  ]   Pedal Edema Milo.Brash   ]    Palpitations Milo.Brash  ] Syncope  Milo.Brash  ]   Presyncope [  n ]  General Review of Systems: [Y] = yes [  ]=no Constitional: recent  weight change [n  ]; anorexia [n  ]; fatigue [ y ]; nausea [ n ]; night sweats [ n ]; fever [ n ]; or chills [n  ];                                                                                                                                          Dental: poor dentition[ n ];   Eye : blurred vision [  ]; diplopia [   ]; vision changes [  ];  Amaurosis fugax[  ]; Resp: cough [  ];  wheezing[  ];  hemoptysis[  ]; shortness of breath[ n ]; paroxysmal nocturnal dyspnea[n  ]; dyspnea on exertion[ y ]; or orthopnea[  ];  GI:  gallstones[  ], vomiting[  ];  dysphagia[  ]; melena[  ];  hematochezia [  ]; heartburn[  ];   Hx of  Colonoscopy[  ]; GU: kidney stones [  ]; hematuria[  ];   dysuria [  ];  nocturia[  ];  history of     obstruction [  ];             Skin: rash, swelling[  ];, hair loss[  ];  peripheral edema[  ];  or itching[  ]; Musculosketetal: myalgias[  ];  joint swelling[  ];  joint erythema[  ];  joint pain[  ];  back pain[  ];  Heme/Lymph: bruising[  ];  bleeding[  ];  anemia[  ];  Neuro: TIA[  ];  headaches[  ];  stroke[  ];  vertigo[  ];  seizures[  ];   paresthesias[  ];  difficulty walking[  ];  Psych:depression[  ]; anxiety[  ];  Endocrine: diabetes[  ];  thyroid dysfunction[  ];  Immunizations: Flu [?  ]; Pneumococcal[ ? ];  Other:  Physical Exam: BP 131/71  Pulse 88  Resp 20  Ht 5' (1.524 m)  Wt 206 lb (93.441 kg)  BMI 40.23 kg/m2  SpO2 94%  General appearance: alert, cooperative and appears older than stated age Neurologic: intact Heart: regular rate and rhythm, S1, S2 normal, no murmur, click, rub or gallop and normal apical impulse Lungs: clear to auscultation bilaterally and normal percussion bilaterally Abdomen: soft, non-tender; bowel sounds normal; no masses,  no organomegaly Extremities: extremities normal, atraumatic, no cyanosis or edema and Homans sign is negative, no sign of DVT Wound: sternum stable   Diagnostic Studies & Laboratory data:      Recent Radiology Findings:   Ct Angio Chest W/cm &/or Wo Cm  04/06/2012  *RADIOLOGY REPORT*  Clinical Data: Aortic dissection  CT ANGIOGRAPHY CHEST  Technique:  Multidetector CT imaging of the chest using the standard protocol during bolus administration of intravenous contrast. Multiplanar reconstructed images including MIPs were obtained and reviewed to evaluate the vascular anatomy.  Contrast: OMNIPAQUE IOHEXOL 350 MG/ML SOLN  Comparison: 06/18/2011  Findings: The aortic dissection is stable in appearance with an aortic diameter of 3.3 cm.  The dissection begins just above the surgical graft and just proximal to the origin of the innominate artery.  Great vessels all takeoff from the true lumen and are not involving the dissection.  The false lumen begins anteriorly and towards the left laterally.  There is filling of contrast anteriorly as well as filling of contrast in the distal descending thoracic aorta.  No evidence of intramural hematoma.  No evidence of extension of the dissection within the thorax.  The subclavian artery, left common carotid artery, innominate artery, right subclavian artery, right common carotid artery are all widely patent.  Vertebral arteries are also grossly patent.  The dissection continues to extend into the abdomen to the lower limit of the study.  The true lumen feeds the celiac SMA and right renal artery.  The false lumen feeds the left renal artery.  These visceral branches are all patent and are without involvement of dissection.  Dependent atelectasis in the lungs.  No pneumothorax and no pleural effusion.  Stable left subclavian AICD device.  Stable right thyroid hypodensity dating back to 2010.  IMPRESSION: No significant change in the aortic dissection status post repair of the ascending dissection.  All great vessels remain patent.  No extension of dissection.  Maximal transverse diameter of the aortic arch is 3.3 cm.  Previously, this was measured at 3.2 cm.   Original Report Authenticated By: Donavan Burnet, M.D.      Recent Lab Findings: Lab Results  Component Value Date   WBC 5.3 06/18/2011   HGB 13.8 06/18/2011   HCT 38.7 06/18/2011   PLT 158 06/18/2011   GLUCOSE 143* 09/13/2011   CHOL 309* 09/13/2011   TRIG 331.0* 09/13/2011   HDL 54.30 09/13/2011   LDLDIRECT 217.7 09/13/2011   LDLCALC 77 12/09/2010   ALT 16 09/13/2011   AST 18 09/13/2011   NA 141 09/13/2011   K 4.9  09/13/2011   CL 105 09/13/2011   CREATININE 0.8 09/13/2011   BUN 25* 09/13/2011   CO2 28 09/13/2011   TSH 0.96 12/09/2010   INR 1.3 09/02/2007   HGBA1C 6.2 09/13/2011      Assessment / Plan:      Patient seen in followup after aortic dissection acute repair done 09/02/2007, followup CT scan is done today and as noted above shows persistent false lumen but without significant enlargement of the aorta Patient continues on good blood pressure control. Plan to see her back in one year with followup CT scan of the chest      Delight Ovens MD  Beeper [phone] Office [phone] 04/06/2012 3:41 PM

## 2012-04-12 DIAGNOSIS — E782 Mixed hyperlipidemia: Secondary | ICD-10-CM | POA: Diagnosis not present

## 2012-04-12 DIAGNOSIS — I498 Other specified cardiac arrhythmias: Secondary | ICD-10-CM | POA: Diagnosis not present

## 2012-04-12 DIAGNOSIS — Z45018 Encounter for adjustment and management of other part of cardiac pacemaker: Secondary | ICD-10-CM | POA: Diagnosis not present

## 2012-04-19 ENCOUNTER — Telehealth: Payer: Self-pay | Admitting: Family Medicine

## 2012-04-19 DIAGNOSIS — F418 Other specified anxiety disorders: Secondary | ICD-10-CM

## 2012-04-19 MED ORDER — ALPRAZOLAM 0.5 MG PO TABS: 0.5000 mg | ORAL_TABLET | Freq: Three times a day (TID) | ORAL | Status: DC | PRN

## 2012-04-19 NOTE — Telephone Encounter (Signed)
Faxed.   KP 

## 2012-04-19 NOTE — Telephone Encounter (Signed)
Ok to fill 

## 2012-04-19 NOTE — Telephone Encounter (Signed)
Last seen 02/22/12 and filled 03/21/12 # 90. Please advise    KP

## 2012-04-19 NOTE — Telephone Encounter (Signed)
Refill: Alprazolam 0.5 mg tab 

## 2012-05-01 ENCOUNTER — Telehealth: Payer: Self-pay | Admitting: Family Medicine

## 2012-05-01 DIAGNOSIS — F418 Other specified anxiety disorders: Secondary | ICD-10-CM

## 2012-05-01 NOTE — Telephone Encounter (Signed)
Refill: Clonazepam 0.5mg  tab.

## 2012-05-02 ENCOUNTER — Other Ambulatory Visit: Payer: Self-pay | Admitting: Family Medicine

## 2012-05-02 MED ORDER — CLONAZEPAM 0.5 MG PO TABS: 0.5000 mg | ORAL_TABLET | Freq: Two times a day (BID) | ORAL | Status: DC | PRN

## 2012-05-02 NOTE — Telephone Encounter (Signed)
Rx faxed.    KP 

## 2012-05-02 NOTE — Telephone Encounter (Signed)
Refill x1 

## 2012-05-02 NOTE — Telephone Encounter (Signed)
Last seen and filled  02/22/12 #60 with 1 refill . Please advise    KP

## 2012-05-09 ENCOUNTER — Other Ambulatory Visit: Payer: Self-pay | Admitting: Family Medicine

## 2012-05-21 ENCOUNTER — Other Ambulatory Visit: Payer: Self-pay | Admitting: Family Medicine

## 2012-05-22 ENCOUNTER — Telehealth: Payer: Self-pay | Admitting: Family Medicine

## 2012-05-22 DIAGNOSIS — F418 Other specified anxiety disorders: Secondary | ICD-10-CM

## 2012-05-22 NOTE — Telephone Encounter (Signed)
Please advise      KP 

## 2012-05-22 NOTE — Telephone Encounter (Signed)
If the pt is still needing to take xanax tid and is taking klonopin and celexa we really need her to go to psych to get her on better medication.  She should not have to take that much xanax.  Refill x1 only

## 2012-05-22 NOTE — Telephone Encounter (Signed)
refill alprazolam 0.5mg  tab  Last OV 3.26.13 Last wrt 5.22.13, qty 90, instructions Take 1 tablet (0.5 mg total) by mouth 3 (three) times daily as needed for sleep or anxiety.

## 2012-05-23 MED ORDER — ALPRAZOLAM 0.5 MG PO TABS: 0.5000 mg | ORAL_TABLET | Freq: Three times a day (TID) | ORAL | Status: DC | PRN

## 2012-05-23 NOTE — Telephone Encounter (Signed)
Ok to refill x 1  

## 2012-05-23 NOTE — Telephone Encounter (Signed)
Discussed with patient and she stated she does not take that much, she stated she was leaving to go out of town for 10 days and she wanted to have the RX on hand, she stated she still had 10-15 pills left in the other bottle, she is only taking the RX twice a day.  She also stated things are starting to get better.   KP

## 2012-06-02 ENCOUNTER — Other Ambulatory Visit: Payer: Self-pay | Admitting: Family Medicine

## 2012-06-02 DIAGNOSIS — F418 Other specified anxiety disorders: Secondary | ICD-10-CM

## 2012-06-02 MED ORDER — CLONAZEPAM 0.5 MG PO TABS: 0.5000 mg | ORAL_TABLET | Freq: Two times a day (BID) | ORAL | Status: DC | PRN

## 2012-06-02 NOTE — Telephone Encounter (Signed)
Ok #10 pills, no RF, further RF by PCP

## 2012-06-02 NOTE — Telephone Encounter (Signed)
Refill done.  

## 2012-06-02 NOTE — Telephone Encounter (Signed)
Ok to refill 

## 2012-06-02 NOTE — Telephone Encounter (Signed)
refill clonazepam 0.5mg  tab qual , no fill, instructions or qty listed Last ov 3.26.13 Last wrt 6.4.13, qty 60 no refills  Take 1 tablet (0.5 mg total) by mouth 2 (two) times daily as needed for anxiety.

## 2012-06-20 ENCOUNTER — Other Ambulatory Visit: Payer: Self-pay | Admitting: Family Medicine

## 2012-06-20 DIAGNOSIS — F418 Other specified anxiety disorders: Secondary | ICD-10-CM

## 2012-06-20 NOTE — Telephone Encounter (Signed)
refill request for ClonazePAM (Tab), last filled by [facility] 7.5.13 qty 10-pt., wants 30-days worth  Can send to [facility] [address] [id number] - [place], [place] - [address] Patient cb# [phone] Last ov follow up on meds-3.26.13

## 2012-06-20 NOTE — Telephone Encounter (Signed)
10 or 30?

## 2012-06-20 NOTE — Telephone Encounter (Signed)
Refill x1 and ov

## 2012-06-20 NOTE — Telephone Encounter (Signed)
Dr.Lowne this patient is currently on Xanax and still taking the klonopin. Dr. Drue Novel wrote her #10 on 06/02/12 and she is now requesting a quantity of 30. Please advise    KP

## 2012-06-21 MED ORDER — ALPRAZOLAM 0.5 MG PO TABS: 0.5000 mg | ORAL_TABLET | Freq: Three times a day (TID) | ORAL | Status: DC | PRN

## 2012-06-21 MED ORDER — CLONAZEPAM 0.5 MG PO TABS: 0.5000 mg | ORAL_TABLET | Freq: Two times a day (BID) | ORAL | Status: DC | PRN

## 2012-06-21 NOTE — Telephone Encounter (Signed)
Verbal authorization from Dr.Lowne to only give the patient quantity #10 Xanax since she is coming in tomorrow to discuss Rx---   KP

## 2012-06-21 NOTE — Telephone Encounter (Signed)
Request Xanax last filled 05/23/12 #90. Please advise      KP   OV scheduled for tomorrow at 1:00-- KP

## 2012-06-21 NOTE — Telephone Encounter (Signed)
Refill: Alprazolam 0.5mg  tab. Last fill 05-23-12  [facility] [address]

## 2012-06-21 NOTE — Telephone Encounter (Signed)
#  30  But I really do want to see her in office before another refill

## 2012-06-22 ENCOUNTER — Ambulatory Visit (INDEPENDENT_AMBULATORY_CARE_PROVIDER_SITE_OTHER): Payer: Medicare Other | Admitting: Family Medicine

## 2012-06-22 ENCOUNTER — Encounter: Payer: Self-pay | Admitting: Family Medicine

## 2012-06-22 VITALS — BP 90/62 | HR 93 | Wt 207.0 lb

## 2012-06-22 DIAGNOSIS — Z1239 Encounter for other screening for malignant neoplasm of breast: Secondary | ICD-10-CM | POA: Diagnosis not present

## 2012-06-22 DIAGNOSIS — F418 Other specified anxiety disorders: Secondary | ICD-10-CM

## 2012-06-22 DIAGNOSIS — Z780 Asymptomatic menopausal state: Secondary | ICD-10-CM

## 2012-06-22 DIAGNOSIS — F411 Generalized anxiety disorder: Secondary | ICD-10-CM

## 2012-06-22 DIAGNOSIS — F341 Dysthymic disorder: Secondary | ICD-10-CM | POA: Diagnosis not present

## 2012-06-22 MED ORDER — ALPRAZOLAM 0.5 MG PO TABS: 0.5000 mg | ORAL_TABLET | Freq: Three times a day (TID) | ORAL | Status: DC | PRN

## 2012-06-22 NOTE — Progress Notes (Signed)
  Subjective:    Patient ID: [name], female    DOB: 09-28-1946, 66 y.o.   MRN: [id number]  HPI Pt is here to discuss her meds specifically klonapin and xanax.  Pt does not like the way the klonopin makes her feel and the xanax works better.  She only take 2 a day.    Review of Systems As above    Objective:   Physical Exam  Constitutional: She is oriented to person, place, and time. She appears well-developed and well-nourished.  Neurological: She is alert and oriented to person, place, and time.  Psychiatric: She has a normal mood and affect. Her behavior is normal. Judgment and thought content normal.          Assessment & Plan:

## 2012-06-22 NOTE — Patient Instructions (Addendum)

## 2012-06-23 ENCOUNTER — Encounter: Payer: Self-pay | Admitting: Family Medicine

## 2012-06-23 NOTE — Assessment & Plan Note (Addendum)
D/c klonopin con't xanax con't celexa Pt feels like she is doing OK now-- if symptoms progress she is willing to see psych

## 2012-06-26 ENCOUNTER — Other Ambulatory Visit (INDEPENDENT_AMBULATORY_CARE_PROVIDER_SITE_OTHER): Payer: Medicare Other

## 2012-06-26 ENCOUNTER — Other Ambulatory Visit: Payer: Self-pay | Admitting: Family Medicine

## 2012-06-26 DIAGNOSIS — I10 Essential (primary) hypertension: Secondary | ICD-10-CM

## 2012-06-26 DIAGNOSIS — E119 Type 2 diabetes mellitus without complications: Secondary | ICD-10-CM | POA: Diagnosis not present

## 2012-06-26 DIAGNOSIS — E785 Hyperlipidemia, unspecified: Secondary | ICD-10-CM | POA: Diagnosis not present

## 2012-06-26 LAB — HEPATIC FUNCTION PANEL
ALT: 13 U/L (ref 0–35)
AST: 19 U/L (ref 0–37)
Bilirubin, Direct: 0.1 mg/dL (ref 0.0–0.3)
Total Bilirubin: 0.7 mg/dL (ref 0.3–1.2)

## 2012-06-26 LAB — LIPID PANEL
HDL: 64.9 mg/dL (ref >39.00)
LDL Cholesterol: 55 mg/dL (ref 0–99)
VLDL: 17 mg/dL (ref 0.0–40.0)

## 2012-06-26 LAB — BASIC METABOLIC PANEL
Chloride: 103 mEq/L (ref 96–112)
GFR: 69.3 mL/min (ref >60.00)
Potassium: 4.3 mEq/L (ref 3.5–5.1)
Sodium: 138 mEq/L (ref 135–145)

## 2012-06-26 LAB — MICROALBUMIN / CREATININE URINE RATIO
Creatinine,U: 189.1 mg/dL
Microalb Creat Ratio: 0.9 mg/g (ref 0.0–30.0)

## 2012-07-04 ENCOUNTER — Other Ambulatory Visit: Payer: Self-pay | Admitting: Family Medicine

## 2012-07-09 ENCOUNTER — Other Ambulatory Visit: Payer: Self-pay | Admitting: Family Medicine

## 2012-08-11 ENCOUNTER — Other Ambulatory Visit: Payer: Self-pay | Admitting: Family Medicine

## 2012-08-11 DIAGNOSIS — F418 Other specified anxiety disorders: Secondary | ICD-10-CM

## 2012-08-11 MED ORDER — ALPRAZOLAM 0.5 MG PO TABS: 0.5000 mg | ORAL_TABLET | Freq: Three times a day (TID) | ORAL | Status: DC | PRN

## 2012-08-11 NOTE — Telephone Encounter (Signed)
Refill: Alprazolam 0.5mg  tab. Last fill 07-04-12

## 2012-08-11 NOTE — Telephone Encounter (Signed)
Last seen 06/22/12 and 07/04/12  #90. Please advise     KP

## 2012-08-22 DIAGNOSIS — Z45018 Encounter for adjustment and management of other part of cardiac pacemaker: Secondary | ICD-10-CM | POA: Diagnosis not present

## 2012-08-22 DIAGNOSIS — R011 Cardiac murmur, unspecified: Secondary | ICD-10-CM | POA: Diagnosis not present

## 2012-08-22 DIAGNOSIS — I495 Sick sinus syndrome: Secondary | ICD-10-CM | POA: Diagnosis not present

## 2012-09-12 ENCOUNTER — Other Ambulatory Visit: Payer: Self-pay | Admitting: Ophthalmology

## 2012-09-12 DIAGNOSIS — L851 Acquired keratosis [keratoderma] palmaris et plantaris: Secondary | ICD-10-CM | POA: Diagnosis not present

## 2012-09-12 DIAGNOSIS — L820 Inflamed seborrheic keratosis: Secondary | ICD-10-CM | POA: Diagnosis not present

## 2012-09-12 DIAGNOSIS — D2310 Other benign neoplasm of skin of unspecified eyelid, including canthus: Secondary | ICD-10-CM | POA: Diagnosis not present

## 2012-09-14 DIAGNOSIS — Z13820 Encounter for screening for osteoporosis: Secondary | ICD-10-CM | POA: Diagnosis not present

## 2012-09-14 DIAGNOSIS — Z1231 Encounter for screening mammogram for malignant neoplasm of breast: Secondary | ICD-10-CM | POA: Diagnosis not present

## 2012-09-20 DIAGNOSIS — R011 Cardiac murmur, unspecified: Secondary | ICD-10-CM | POA: Diagnosis not present

## 2012-09-20 DIAGNOSIS — R0989 Other specified symptoms and signs involving the circulatory and respiratory systems: Secondary | ICD-10-CM | POA: Diagnosis not present

## 2012-09-20 HISTORY — PX: TRANSTHORACIC ECHOCARDIOGRAM: SHX275

## 2012-10-02 ENCOUNTER — Telehealth: Payer: Self-pay | Admitting: Family Medicine

## 2012-10-02 DIAGNOSIS — F418 Other specified anxiety disorders: Secondary | ICD-10-CM

## 2012-10-02 MED ORDER — ALPRAZOLAM 0.5 MG PO TABS: 0.5000 mg | ORAL_TABLET | Freq: Three times a day (TID) | ORAL | Status: DC | PRN

## 2012-10-02 NOTE — Telephone Encounter (Signed)
Last seen 06/20/13 and filled 08/11/12 # 90. Please advise    KP

## 2012-10-02 NOTE — Telephone Encounter (Signed)
Refill x1 

## 2012-10-02 NOTE — Telephone Encounter (Signed)
Rx faxed.    KP 

## 2012-10-02 NOTE — Telephone Encounter (Signed)
Refill: Alprazolam 0.5 mg tab. Last fill 08-11-12

## 2012-10-03 DIAGNOSIS — Z23 Encounter for immunization: Secondary | ICD-10-CM | POA: Diagnosis not present

## 2012-10-30 ENCOUNTER — Encounter: Payer: Self-pay | Admitting: Family Medicine

## 2012-11-01 ENCOUNTER — Telehealth: Payer: Self-pay | Admitting: Family Medicine

## 2012-11-01 MED ORDER — CITALOPRAM HYDROBROMIDE 40 MG PO TABS: 40.0000 mg | ORAL_TABLET | Freq: Every day | ORAL | Status: DC

## 2012-11-01 NOTE — Telephone Encounter (Signed)
please advise in Dr.Lowne's absence.       KP

## 2012-11-01 NOTE — Telephone Encounter (Signed)
Ok for #30.  Dr Laury Axon will determine if she wants to continue w/ refills

## 2012-11-01 NOTE — Telephone Encounter (Signed)
Pt states her new cardiologist, Dr. Royann Shivers, would prefer that Dr. Laury Axon write the patient's rx for citalopram. In the past it has been written by Dr. Clarene Duke, but he has since retired. Patient states she has been on 40 mg daily for several years now and will run out this week. [facility] uses [facility] [address]

## 2012-11-17 ENCOUNTER — Encounter: Payer: Self-pay | Admitting: Family Medicine

## 2012-11-29 HISTORY — PX: PACEMAKER GENERATOR CHANGE: SHX5998

## 2012-11-29 HISTORY — PX: EYE SURGERY: SHX253

## 2012-12-01 ENCOUNTER — Other Ambulatory Visit: Payer: Self-pay | Admitting: Family Medicine

## 2012-12-01 DIAGNOSIS — F418 Other specified anxiety disorders: Secondary | ICD-10-CM

## 2012-12-01 MED ORDER — ALPRAZOLAM 0.5 MG PO TABS: 0.5000 mg | ORAL_TABLET | Freq: Three times a day (TID) | ORAL | Status: DC | PRN

## 2012-12-01 NOTE — Telephone Encounter (Signed)
Last seen 06/22/12 and filled 10/02/12 # 90. Please advise       KP

## 2012-12-01 NOTE — Addendum Note (Signed)
Addended by: Arnette Norris on: 12/01/2012 03:32 PM   Modules accepted: Orders

## 2012-12-29 ENCOUNTER — Other Ambulatory Visit: Payer: Self-pay | Admitting: Family Medicine

## 2012-12-29 DIAGNOSIS — F418 Other specified anxiety disorders: Secondary | ICD-10-CM

## 2012-12-29 MED ORDER — ALPRAZOLAM 0.5 MG PO TABS: 0.5000 mg | ORAL_TABLET | Freq: Three times a day (TID) | ORAL | Status: DC | PRN

## 2012-12-29 NOTE — Telephone Encounter (Signed)
REFILL ALPRAZolam (Tab) 0.5 MG No qty/instruction -- last fill 9.13.13 (per fax) last wrt 1.3.14

## 2012-12-29 NOTE — Telephone Encounter (Signed)
Refill x1 

## 2012-12-29 NOTE — Telephone Encounter (Signed)
Last seen 06/22/12 and filled 12/01/12. Please advise      KP

## 2013-01-01 DIAGNOSIS — H04129 Dry eye syndrome of unspecified lacrimal gland: Secondary | ICD-10-CM | POA: Diagnosis not present

## 2013-01-01 DIAGNOSIS — H2510 Age-related nuclear cataract, unspecified eye: Secondary | ICD-10-CM | POA: Diagnosis not present

## 2013-01-01 DIAGNOSIS — H52209 Unspecified astigmatism, unspecified eye: Secondary | ICD-10-CM | POA: Diagnosis not present

## 2013-01-01 DIAGNOSIS — H43819 Vitreous degeneration, unspecified eye: Secondary | ICD-10-CM | POA: Diagnosis not present

## 2013-01-08 ENCOUNTER — Encounter: Payer: Self-pay | Admitting: Lab

## 2013-01-08 ENCOUNTER — Other Ambulatory Visit: Payer: Self-pay | Admitting: Family Medicine

## 2013-01-08 DIAGNOSIS — E119 Type 2 diabetes mellitus without complications: Secondary | ICD-10-CM

## 2013-01-08 MED ORDER — SITAGLIPTIN PHOSPHATE 100 MG PO TABS: ORAL_TABLET | ORAL | Status: DC

## 2013-01-08 NOTE — Telephone Encounter (Signed)
Refill for Januvia sent to [facility] on [address]

## 2013-01-09 ENCOUNTER — Ambulatory Visit (INDEPENDENT_AMBULATORY_CARE_PROVIDER_SITE_OTHER): Payer: Medicare Other | Admitting: Family Medicine

## 2013-01-09 ENCOUNTER — Encounter: Payer: Self-pay | Admitting: Family Medicine

## 2013-01-09 VITALS — BP 116/80 | HR 89 | Temp 98.3°F | Ht 59.75 in | Wt 205.2 lb

## 2013-01-09 DIAGNOSIS — IMO0002 Reserved for concepts with insufficient information to code with codable children: Secondary | ICD-10-CM | POA: Diagnosis not present

## 2013-01-09 DIAGNOSIS — E1165 Type 2 diabetes mellitus with hyperglycemia: Secondary | ICD-10-CM

## 2013-01-09 DIAGNOSIS — I7101 Dissection of thoracic aorta: Secondary | ICD-10-CM

## 2013-01-09 DIAGNOSIS — E785 Hyperlipidemia, unspecified: Secondary | ICD-10-CM

## 2013-01-09 DIAGNOSIS — E119 Type 2 diabetes mellitus without complications: Secondary | ICD-10-CM | POA: Diagnosis not present

## 2013-01-09 DIAGNOSIS — I71019 Dissection of thoracic aorta, unspecified: Secondary | ICD-10-CM

## 2013-01-09 DIAGNOSIS — I10 Essential (primary) hypertension: Secondary | ICD-10-CM

## 2013-01-09 DIAGNOSIS — F411 Generalized anxiety disorder: Secondary | ICD-10-CM

## 2013-01-09 DIAGNOSIS — E118 Type 2 diabetes mellitus with unspecified complications: Secondary | ICD-10-CM | POA: Diagnosis not present

## 2013-01-09 DIAGNOSIS — Z23 Encounter for immunization: Secondary | ICD-10-CM

## 2013-01-09 LAB — HEPATIC FUNCTION PANEL
ALT: 23 U/L (ref 0–35)
AST: 26 U/L (ref 0–37)
Alkaline Phosphatase: 55 U/L (ref 39–117)
Bilirubin, Direct: 0.1 mg/dL (ref 0.0–0.3)
Total Bilirubin: 0.8 mg/dL (ref 0.3–1.2)
Total Protein: 8.1 g/dL (ref 6.0–8.3)

## 2013-01-09 LAB — BASIC METABOLIC PANEL
CO2: 27 mEq/L (ref 19–32)
Chloride: 98 mEq/L (ref 96–112)
Potassium: 4 mEq/L (ref 3.5–5.1)

## 2013-01-09 LAB — POCT URINALYSIS DIPSTICK
Blood, UA: NEGATIVE
Glucose, UA: NEGATIVE
Leukocytes, UA: NEGATIVE
Nitrite, UA: NEGATIVE
Urobilinogen, UA: 0.2

## 2013-01-09 LAB — LIPID PANEL
LDL Cholesterol: 85 mg/dL (ref 0–99)
Total CHOL/HDL Ratio: 3
Triglycerides: 182 mg/dL — ABNORMAL HIGH (ref 0.0–149.0)

## 2013-01-09 LAB — MICROALBUMIN / CREATININE URINE RATIO: Microalb, Ur: 2.3 mg/dL — ABNORMAL HIGH (ref 0.0–1.9)

## 2013-01-09 NOTE — Assessment & Plan Note (Signed)
con't meds  Check labs 

## 2013-01-09 NOTE — Patient Instructions (Addendum)

## 2013-01-09 NOTE — Progress Notes (Signed)
  Subjective:    Patient ID: [name], female    DOB: Nov 19, 1946, 67 y.o.   MRN: [id number]  HPI Pt here to f/u anxiety at cards request but she feels she was just stressed out that day and she fine now.  HYPERTENSION Disease Monitoring Blood pressure range-97-110/70 Chest pain- no      Dyspnea- no Medications Compliance- ggd Lightheadedness- no   Edema- no   DIABETES Disease Monitoring Blood Sugar ranges-110--160 Polyuria- no New Visual problems- no Medications Compliance- good Hypoglycemic symptoms- no   HYPERLIPIDEMIA Disease Monitoring See symptoms for Hypertension Medications Compliance- good RUQ pain- no  Muscle aches- no  ROS See HPI above   PMH Smoking Status noted    Review of Systems As above     Objective:   Physical Exam  BP 116/80  Pulse 89  Temp(Src) 98.3 F (36.8 C) (Oral)  Ht 4' 11.75" (1.518 m)  Wt 205 lb 3.2 oz (93.078 kg)  BMI 40.39 kg/m2  SpO2 97% General appearance: alert, cooperative, appears stated age and no distress Lungs: clear to auscultation bilaterally Heart: S1, S2 normal and + murmur Extremities: extremities normal, atraumatic, no cyanosis or edema Neurologic: Grossly normal Sensory exam of the foot is normal, tested with the monofilament. Good pulses, no lesions or ulcers, good peripheral pulses.       Assessment & Plan:

## 2013-01-09 NOTE — Assessment & Plan Note (Signed)
Check labs today con't meds 

## 2013-01-09 NOTE — Assessment & Plan Note (Signed)
Stable con't meds , check labs

## 2013-01-09 NOTE — Assessment & Plan Note (Signed)
Pt doing well con't meds

## 2013-01-09 NOTE — Addendum Note (Signed)
Addended by: Arnette Norris on: 01/09/2013 11:43 AM   Modules accepted: Orders

## 2013-01-09 NOTE — Assessment & Plan Note (Signed)
Per vascular Pt doing well

## 2013-01-19 ENCOUNTER — Encounter: Payer: Self-pay | Admitting: Family Medicine

## 2013-01-19 ENCOUNTER — Other Ambulatory Visit: Payer: Self-pay

## 2013-01-19 MED ORDER — FENOFIBRATE 160 MG PO TABS: 160.0000 mg | ORAL_TABLET | Freq: Every day | ORAL | Status: DC

## 2013-01-29 ENCOUNTER — Other Ambulatory Visit: Payer: Self-pay | Admitting: Family Medicine

## 2013-01-30 NOTE — Telephone Encounter (Signed)
Please advise      KP 

## 2013-02-06 ENCOUNTER — Other Ambulatory Visit: Payer: Self-pay | Admitting: Cardiovascular Disease

## 2013-02-06 ENCOUNTER — Encounter: Payer: Self-pay | Admitting: Family Medicine

## 2013-02-06 DIAGNOSIS — I714 Abdominal aortic aneurysm, without rupture, unspecified: Secondary | ICD-10-CM

## 2013-02-06 DIAGNOSIS — I7100 Dissection of unspecified site of aorta: Secondary | ICD-10-CM

## 2013-02-06 DIAGNOSIS — W19XXXA Unspecified fall, initial encounter: Secondary | ICD-10-CM

## 2013-02-06 DIAGNOSIS — R42 Dizziness and giddiness: Secondary | ICD-10-CM | POA: Diagnosis not present

## 2013-02-06 DIAGNOSIS — I639 Cerebral infarction, unspecified: Secondary | ICD-10-CM

## 2013-02-06 DIAGNOSIS — R55 Syncope and collapse: Secondary | ICD-10-CM | POA: Diagnosis not present

## 2013-02-06 DIAGNOSIS — I10 Essential (primary) hypertension: Secondary | ICD-10-CM | POA: Diagnosis not present

## 2013-02-06 DIAGNOSIS — I719 Aortic aneurysm of unspecified site, without rupture: Secondary | ICD-10-CM | POA: Diagnosis not present

## 2013-02-09 ENCOUNTER — Ambulatory Visit
Admission: RE | Admit: 2013-02-09 | Discharge: 2013-02-09 | Disposition: A | Discharge status: Home / Self Care | Payer: Medicare Other | Source: Ambulatory Visit | Attending: Cardiovascular Disease | Admitting: Cardiovascular Disease

## 2013-02-09 DIAGNOSIS — I7100 Dissection of unspecified site of aorta: Secondary | ICD-10-CM

## 2013-02-09 DIAGNOSIS — I712 Thoracic aortic aneurysm, without rupture, unspecified: Secondary | ICD-10-CM | POA: Diagnosis not present

## 2013-02-09 DIAGNOSIS — I7102 Dissection of abdominal aorta: Secondary | ICD-10-CM | POA: Diagnosis not present

## 2013-02-09 DIAGNOSIS — I714 Abdominal aortic aneurysm, without rupture, unspecified: Secondary | ICD-10-CM

## 2013-02-09 DIAGNOSIS — S0990XA Unspecified injury of head, initial encounter: Secondary | ICD-10-CM | POA: Diagnosis not present

## 2013-02-09 DIAGNOSIS — I639 Cerebral infarction, unspecified: Secondary | ICD-10-CM

## 2013-02-09 DIAGNOSIS — K5730 Diverticulosis of large intestine without perforation or abscess without bleeding: Secondary | ICD-10-CM | POA: Diagnosis not present

## 2013-02-09 DIAGNOSIS — W19XXXA Unspecified fall, initial encounter: Secondary | ICD-10-CM

## 2013-02-09 MED ORDER — IOHEXOL 300 MG/ML  SOLN
125.0000 mL | Freq: Once | INTRAMUSCULAR | Status: AC | PRN
  Administered 2013-02-09: 125 mL via INTRAVENOUS

## 2013-02-19 ENCOUNTER — Other Ambulatory Visit: Payer: Self-pay | Admitting: Family Medicine

## 2013-02-19 DIAGNOSIS — F418 Other specified anxiety disorders: Secondary | ICD-10-CM

## 2013-02-20 ENCOUNTER — Other Ambulatory Visit: Payer: Self-pay | Admitting: Family Medicine

## 2013-02-20 DIAGNOSIS — F418 Other specified anxiety disorders: Secondary | ICD-10-CM

## 2013-02-20 MED ORDER — ALPRAZOLAM 0.5 MG PO TABS: 0.5000 mg | ORAL_TABLET | Freq: Three times a day (TID) | ORAL | Status: DC | PRN

## 2013-02-20 NOTE — Telephone Encounter (Signed)
Last OV 01-10-12, last filled 12-29-12 #90

## 2013-02-20 NOTE — Addendum Note (Signed)
Addended by: Candie Echevaria L on: 02/20/2013 03:56 PM   Modules accepted: Orders

## 2013-02-20 NOTE — Telephone Encounter (Signed)
Rx sent 

## 2013-02-20 NOTE — Telephone Encounter (Signed)
Left message to call office to advise Pt will need to pick up rx and sign agreement. Rx placed up front.

## 2013-02-21 ENCOUNTER — Telehealth: Payer: Self-pay | Admitting: *Deleted

## 2013-02-21 NOTE — Telephone Encounter (Signed)
Spoke with pt in detail concerning UDS and Controlled Substance Agreement, she is in complete agreement with this and is very supportive in this endeavor.

## 2013-02-23 DIAGNOSIS — Z79899 Other long term (current) drug therapy: Secondary | ICD-10-CM | POA: Diagnosis not present

## 2013-03-09 ENCOUNTER — Encounter: Payer: Self-pay | Admitting: Family Medicine

## 2013-04-02 ENCOUNTER — Other Ambulatory Visit: Payer: Self-pay | Admitting: Family Medicine

## 2013-04-03 NOTE — Telephone Encounter (Signed)
Last seen 01/09/13 and filled 02/20/13 #90. Please advise     KP

## 2013-04-06 ENCOUNTER — Other Ambulatory Visit: Payer: Self-pay | Admitting: *Deleted

## 2013-04-06 DIAGNOSIS — I719 Aortic aneurysm of unspecified site, without rupture: Secondary | ICD-10-CM

## 2013-04-11 ENCOUNTER — Other Ambulatory Visit: Payer: Self-pay | Admitting: *Deleted

## 2013-04-11 DIAGNOSIS — I7101 Dissection of thoracic aorta: Secondary | ICD-10-CM

## 2013-04-11 DIAGNOSIS — I71019 Dissection of thoracic aorta, unspecified: Secondary | ICD-10-CM

## 2013-04-15 ENCOUNTER — Encounter: Payer: Self-pay | Admitting: Cardiothoracic Surgery

## 2013-04-26 ENCOUNTER — Ambulatory Visit: Payer: Medicare Other | Admitting: Cardiothoracic Surgery

## 2013-04-30 ENCOUNTER — Other Ambulatory Visit: Payer: Self-pay | Admitting: General Practice

## 2013-04-30 ENCOUNTER — Other Ambulatory Visit: Payer: Self-pay | Admitting: Family Medicine

## 2013-04-30 DIAGNOSIS — E119 Type 2 diabetes mellitus without complications: Secondary | ICD-10-CM

## 2013-04-30 DIAGNOSIS — E785 Hyperlipidemia, unspecified: Secondary | ICD-10-CM

## 2013-04-30 MED ORDER — ROSUVASTATIN CALCIUM 20 MG PO TABS: ORAL_TABLET | ORAL | Status: DC

## 2013-04-30 NOTE — Telephone Encounter (Signed)
Pt called and notified she needs to schedule a fasting lab appt. Labs ordered already.

## 2013-05-01 ENCOUNTER — Other Ambulatory Visit: Payer: Self-pay | Admitting: Family Medicine

## 2013-05-04 ENCOUNTER — Other Ambulatory Visit (INDEPENDENT_AMBULATORY_CARE_PROVIDER_SITE_OTHER): Payer: Medicare Other

## 2013-05-04 DIAGNOSIS — E119 Type 2 diabetes mellitus without complications: Secondary | ICD-10-CM | POA: Diagnosis not present

## 2013-05-04 DIAGNOSIS — E785 Hyperlipidemia, unspecified: Secondary | ICD-10-CM

## 2013-05-04 DIAGNOSIS — Z780 Asymptomatic menopausal state: Secondary | ICD-10-CM

## 2013-05-04 LAB — HEMOGLOBIN A1C: Hgb A1c MFr Bld: 6.7 % — ABNORMAL HIGH (ref 4.6–6.5)

## 2013-05-04 LAB — BASIC METABOLIC PANEL
BUN: 18 mg/dL (ref 6–23)
Calcium: 9.8 mg/dL (ref 8.4–10.5)
GFR: 75.05 mL/min (ref >60.00)
Glucose, Bld: 146 mg/dL — ABNORMAL HIGH (ref 70–99)

## 2013-05-04 LAB — LIPID PANEL
Cholesterol: 146 mg/dL (ref 0–200)
HDL: 46.7 mg/dL (ref >39.00)

## 2013-05-04 LAB — HEPATIC FUNCTION PANEL
ALT: 19 U/L (ref 0–35)
AST: 18 U/L (ref 0–37)
Bilirubin, Direct: 0 mg/dL (ref 0.0–0.3)
Total Bilirubin: 0.6 mg/dL (ref 0.3–1.2)
Total Protein: 7 g/dL (ref 6.0–8.3)

## 2013-05-17 ENCOUNTER — Ambulatory Visit: Payer: Medicare Other | Admitting: Cardiothoracic Surgery

## 2013-05-17 ENCOUNTER — Other Ambulatory Visit: Payer: Medicare Other

## 2013-05-19 ENCOUNTER — Other Ambulatory Visit: Payer: Self-pay | Admitting: Family Medicine

## 2013-05-21 NOTE — Telephone Encounter (Signed)
Last seen 01/09/13 and filled 04/02/13 #90. Please advise      KP

## 2013-05-24 ENCOUNTER — Ambulatory Visit: Payer: Medicare Other | Admitting: Cardiothoracic Surgery

## 2013-06-09 ENCOUNTER — Other Ambulatory Visit: Payer: Self-pay | Admitting: Family Medicine

## 2013-06-14 ENCOUNTER — Encounter: Payer: Self-pay | Admitting: Cardiothoracic Surgery

## 2013-06-14 ENCOUNTER — Ambulatory Visit (INDEPENDENT_AMBULATORY_CARE_PROVIDER_SITE_OTHER): Payer: Medicare Other | Admitting: Cardiothoracic Surgery

## 2013-06-14 ENCOUNTER — Ambulatory Visit: Payer: Medicare Other | Admitting: Cardiothoracic Surgery

## 2013-06-14 VITALS — BP 133/73 | HR 81 | Resp 20 | Ht 60.0 in | Wt 205.0 lb

## 2013-06-14 DIAGNOSIS — I71019 Dissection of thoracic aorta, unspecified: Secondary | ICD-10-CM | POA: Diagnosis not present

## 2013-06-14 DIAGNOSIS — I7101 Dissection of thoracic aorta: Secondary | ICD-10-CM | POA: Diagnosis not present

## 2013-06-14 NOTE — Progress Notes (Signed)
[address]       [place] [address]             [phone]              [name] [facility] Medical Record #[id number] Date of Birth: [birth year]  Referring: Lelon Perla, DO Primary Care: Loreen Freud, DO  Chief Complaint:    Chief Complaint  Patient presents with  . Follow-up    1 year f/u with Chest CT    History of Present Illness:    Patient is status post replacement of the ascending aorta for acute aortic dissection in 2008. She's had no significant changes over the past year with exception she notes increasing stress related to illnesses of her husband.      Current Activity/ Functional Status: Patient is independent with mobility/ambulation, transfers, ADL's, IADL's.   Past Medical History  Diagnosis Date  . Anxiety   . Hyperlipidemia   . Hypertension   . Diabetes mellitus   . GERD (gastroesophageal reflux disease)   . Cancer     skin  . Kidney stones   . Diverticulosis   . Hemorrhoids   . Acute thoracic aortic dissection     Past Surgical History  Procedure Laterality Date  . Permanent pacemaker    . Abdominal aortic aneurysm repair    . Appendectomy    . Abdominal hysterectomy      Family History  Problem Relation Age of Onset  . Heart disease Father     mi  . Cancer Neg Hx     History   Social History  . Marital Status: Married    Spouse Name: N/A    Number of Children: N/A  . Years of Education: N/A   Occupational History  . Not on file.   Social History Main Topics  . Smoking status: Former Games developer  . Smokeless tobacco: Not on file  . Alcohol Use: No  . Drug Use: No  . Sexually Active:    Other Topics Concern  . Not on file   Social History Narrative  . No narrative on file    History  Smoking status  . Former Smoker  Smokeless tobacco  . Not on file    History  Alcohol Use No     Allergies  Allergen Reactions  . Oxycodone Itching  . Vicodin  (Hydrocodone-Acetaminophen) Itching  . Neomycin-Bacitracin Zn-Polymyx Other (See Comments)    Red , swollen eye  . Sulfonamide Derivatives     Current Outpatient Prescriptions  Medication Sig Dispense Refill  . ALPRAZolam (XANAX) 0.5 MG tablet TAKE 1 TABLET 3 TIMES A DAY AS NEEDED ANXIETY OR SLEEP  90 tablet  0  . aspirin 81 MG tablet Take 81 mg by mouth daily.      . carvedilol (COREG) 12.5 MG tablet Take 12.5 mg by mouth 2 (two) times daily with a meal.        . fenofibrate 160 MG tablet Take 1 tablet (160 mg total) by mouth daily.  30 tablet  2  . furosemide (LASIX) 20 MG tablet Take 20 mg by mouth as needed.        . hyoscyamine (LEVSIN, ANASPAZ) 0.125 MG tablet TAKE ONE TABLET BY MOUTH TWICE DAILY AS NEEDED  60 tablet  2  . JANUVIA 100 MG tablet TAKE 1 TABLET BY MOUTH EVERY DAY  30 tablet  2  . lansoprazole (PREVACID) 15  MG capsule Take 15 mg by mouth as needed.        . rosuvastatin (CRESTOR) 20 MG tablet Take 1 tablet (20 mg total) by mouth daily. 1 tab by mouth at bedtime---repeat labs are due now  30 tablet  2  . Vitamin D, Ergocalciferol, (DRISDOL) 50000 UNITS CAPS Take 50,000 Units by mouth.        . citalopram (CELEXA) 40 MG tablet TAKE 1 TABLET (40 MG TOTAL) BY MOUTH DAILY.  30 tablet  1   No current facility-administered medications for this visit.       Review of Systems:     Cardiac Review of Systems: Y or N  Chest Pain [  n  ]  Resting SOB [n   ] Exertional SOB  Cove.Etienne  ]  Orthopnea Milo.Brash  ]   Pedal Edema Milo.Brash   ]    Palpitations Milo.Brash  ] Syncope  Milo.Brash  ]   Presyncope [  n ]  General Review of Systems: [Y] = yes [  ]=no Constitional: recent weight change [n  ]; anorexia [n  ]; fatigue [ y ]; nausea [ n ]; night sweats [ n ]; fever [ n ]; or chills [n  ];                                                                                                                                          Dental: poor dentition[ n ];   Eye : blurred vision [  ]; diplopia [   ]; vision changes [   ];  Amaurosis fugax[  ]; Resp: cough [  ];  wheezing[  ];  hemoptysis[  ]; shortness of breath[ n ]; paroxysmal nocturnal dyspnea[n  ]; dyspnea on exertion[ y ]; or orthopnea[  ];  GI:  gallstones[  ], vomiting[  ];  dysphagia[  ]; melena[  ];  hematochezia [  ]; heartburn[  ];   Hx of  Colonoscopy[  ]; GU: kidney stones [  ]; hematuria[  ];   dysuria [  ];  nocturia[  ];  history of     obstruction [  ];             Skin: rash, swelling[  ];, hair loss[  ];  peripheral edema[  ];  or itching[  ]; Musculosketetal: myalgias[  ];  joint swelling[  ];  joint erythema[  ];  joint pain[  ];  back pain[  ];  Heme/Lymph: bruising[  ];  bleeding[  ];  anemia[  ];  Neuro: TIA[  ];  headaches[  ];  stroke[  ];  vertigo[  ];  seizures[  ];   paresthesias[  ];  difficulty walking[  ];  Psych:depression[  ]; anxiety[  ];  Endocrine: diabetes[  ];  thyroid dysfunction[  ];  Immunizations: Flu [?  ]; Pneumococcal[ ? ];  Other:  Physical Exam: BP 133/73  Pulse 81  Resp 20  Ht 5' (1.524 m)  Wt 205 lb (92.987 kg)  BMI 40.04 kg/m2  SpO2 94%  General appearance: alert, cooperative and appears older than stated age Neurologic: intact Heart: regular rate and rhythm, S1, S2 normal, no murmur, click, rub or gallop and normal apical impulse Lungs: clear to auscultation bilaterally and normal percussion bilaterally Abdomen: soft, non-tender; bowel sounds normal; no masses,  no organomegaly Extremities: extremities normal, atraumatic, no cyanosis or edema and Homans sign is negative, no sign of DVT Wound: sternum stable   Diagnostic Studies & Laboratory data:     Recent Radiology Findings:    CT march 2014 RADIOLOGY REPORT*  Clinical Data: History of aortic dissection.  CT CHEST, ABDOMEN AND PELVIS WITH CONTRAST  Technique: Multidetector CT imaging of the chest, abdomen and  pelvis was performed following the standard protocol during bolus  administration of intravenous contrast.  Contrast:  OMNIPAQUE IOHEXOL 300 MG/ML SOLN  Comparison: CT of the chest, abdomen and pelvis 06/18/2011.  CT CHEST  Findings:  Mediastinum: Postoperative changes of median sternotomy are noted,  likely from prior graft repair of the ascending thoracic aorta  (presumably from prior type A aortic dissection). Again noted is a  dissection which begins after the distal end of the ascending  thoracic aorta graft in the proximal aortic arch. The dissection  flap does not appear to extend into the great vessels. The  dissection does extend inferiorly to the level of the pelvis (see  below). The aortic arch is mildly dilated (3.2 cm in diameter)  which is unchanged, and the descending thoracic aorta is also  mildly dilated up to 3.4 cm in diameter at the level of the  pulmonary arteries (also unchanged). Contrast fills both the true  and false lumens. No abnormal high attenuation material is seen  adjacent to the thoracic aorta to suggest active hemorrhage at this  time.  Heart size is mildly enlarged. There is no significant pericardial  fluid, thickening or pericardial calcification. Calcifications of  the aortic valve. Left-sided pacemaker with lead terminating in  the right atrium and right ventricular apex. No pathologically  enlarged mediastinal or hilar lymph nodes. Esophagus is  unremarkable appearance.  Lungs/Pleura: No acute consolidative airspace disease. No pleural  effusions. There are some linear opacities scattered throughout  the periphery of the lung bases bilaterally which are unchanged and  compatible with areas of mild chronic pleural parenchymal scarring.  No suspicious appearing pulmonary nodules or masses are identified  at this time.  Musculoskeletal: Sternotomy wires. There are no aggressive  appearing lytic or blastic lesions noted in the visualized portions  of the skeleton.  IMPRESSION:  1. Postoperative changes of graft repair of the ascending thoracic  aorta for prior  type A dissection. The residual  dissection/aneurysm in the arch and descending thoracic aorta is  essentially unchanged in appearance compared to the prior  examination, as detailed above.  2. Mild cardiomegaly.  3. Additional incidental findings, as above.  CT ABDOMEN AND PELVIS  Findings:  Abdomen/Pelvis: The previously described dissection flap extends  into the pelvis where appears to terminate in the distal left  common iliac artery. All of the abdominal and pelvic vessels  appear to be perfusing well at this time, with the true lumen  giving rise to the celiac axis, superior mesenteric artery, right  renal artery and the inferior mesenteric artery, and the false  lumen giving rise  to the left renal artery. The dissection flap  terminates in the distal left common iliac artery, with the true  lumen giving rise to the left internal iliac artery and the false  lumen giving rise to left external iliac artery. The dissection  flap does not extend into the right iliac vasculature. Maximal  abdominal aortic diameter is 2.5 x 2.0 cm at a level just below the  renal arteries.  The appearance of the liver, gallbladder, pancreas, spleen and left  adrenal gland is unremarkable. Low attenuation right adrenal  nodule is unchanged in size measuring 17 mm in diameter and has  been previously characterized as an adenoma. Low attenuation  lesions in the kidneys bilaterally are unchanged and compatible  with simple cysts, with the largest lesion in the lower pole of the  right kidney measuring approximately 3.3 cm in diameter.  No significant volume of ascites. No pneumoperitoneum. No  pathologic distension of small bowel. There are numerous colonic  diverticula, particularly of the descending colon and sigmoid  colon, without surrounding inflammatory changes to suggest acute  diverticulitis at this time. Status post hysterectomy. Ovaries  are unremarkable in appearance. Urinary bladder is  normal in  appearance. No definite pathologic lymphadenopathy identified  within the abdomen or pelvis.  Musculoskeletal: There are no aggressive appearing lytic or blastic  lesions noted in the visualized portions of the skeleton.  IMPRESSION:  1. No change in the extent of the continuation of the dissection  from the thoracic aorta into the abdomen and pelvis, as detailed  above.  2. 1.7 cm right adrenal adenoma is unchanged.  3. Mild colonic diverticulosis without evidence to suggest acute  diverticulitis at this time.  4. Additional incidental findings, as above, similar to prior  studies.  Original Report Authenticated By: Trudie Reed, M.D.     Recent Lab Findings: Lab Results  Component Value Date   WBC 5.3 06/18/2011   HGB 13.8 06/18/2011   HCT 38.7 06/18/2011   PLT 158 06/18/2011   GLUCOSE 146* 05/04/2013   CHOL 146 05/04/2013   TRIG 174.0* 05/04/2013   HDL 46.70 05/04/2013   LDLDIRECT 217.7 09/13/2011   LDLCALC 65 05/04/2013   ALT 19 05/04/2013   AST 18 05/04/2013   NA 142 05/04/2013   K 4.2 05/04/2013   CL 106 05/04/2013   CREATININE 0.8 05/04/2013   BUN 18 05/04/2013   CO2 24 05/04/2013   TSH 0.96 12/09/2010   INR 1.3 09/02/2007   HGBA1C 6.7* 05/04/2013      Assessment / Plan:      Patient seen in followup after aortic dissection acute repair done 09/02/2007, followup CT scan done in march 2014 and as noted above shows persistent false lumen but without significant enlargement of the aorta Patient continues on good blood pressure control. Plan to see her back in 18 months with followup CT scan of the chest      Delight Ovens MD  Beeper [phone] Office [phone] 06/15/2013 6:36 PM

## 2013-06-19 ENCOUNTER — Other Ambulatory Visit: Payer: Self-pay | Admitting: Gastroenterology

## 2013-06-20 ENCOUNTER — Telehealth: Payer: Self-pay | Admitting: Gastroenterology

## 2013-06-20 NOTE — Telephone Encounter (Signed)
Pt advised she must have follow up visit for any refills

## 2013-06-25 ENCOUNTER — Other Ambulatory Visit: Payer: Self-pay | Admitting: Family Medicine

## 2013-06-27 ENCOUNTER — Telehealth: Payer: Self-pay | Admitting: *Deleted

## 2013-06-27 MED ORDER — ALPRAZOLAM 0.5 MG PO TABS: ORAL_TABLET | ORAL | Status: DC

## 2013-06-27 NOTE — Telephone Encounter (Signed)
Pharmacy is requesting a refill on Alprazolam 0.5 mg last ov 01/09/13 last date filled 05/19/13 90 ct 0 R. Patient has a controlled substance contract on file. This is a [facility] patient please advise.

## 2013-06-27 NOTE — Telephone Encounter (Signed)
#  90, NR 

## 2013-06-27 NOTE — Telephone Encounter (Signed)
Faxed to pharmacy:      Notified patient.

## 2013-06-27 NOTE — Telephone Encounter (Signed)
Ok for 90

## 2013-06-27 NOTE — Telephone Encounter (Signed)
Pharmacy is requesting a refill on Alprazolam 0.5 mg last ov 01/09/13 last date filled 05/22/13 90 ct 0 R. Patient has a controlled substance contract on file.  This is a Dr. Laury Axon patient please advise.

## 2013-06-27 NOTE — Telephone Encounter (Signed)
Pharmacy is requesting a refill on Alprazolam 0.5 mg last ov 01/09/13 last date filled 05/22/13 90 ct 0 R. Patient has a controlled substance contract on file. This is a Dr. Laury Axon patient please

## 2013-07-18 ENCOUNTER — Encounter: Payer: Self-pay | Admitting: Cardiovascular Disease

## 2013-07-18 ENCOUNTER — Other Ambulatory Visit: Payer: Self-pay | Admitting: Family Medicine

## 2013-07-18 ENCOUNTER — Ambulatory Visit (INDEPENDENT_AMBULATORY_CARE_PROVIDER_SITE_OTHER): Payer: Medicare Other | Admitting: Cardiovascular Disease

## 2013-07-18 VITALS — BP 140/70 | HR 81 | Resp 20 | Ht 60.5 in | Wt 206.6 lb

## 2013-07-18 DIAGNOSIS — E785 Hyperlipidemia, unspecified: Secondary | ICD-10-CM

## 2013-07-18 DIAGNOSIS — I10 Essential (primary) hypertension: Secondary | ICD-10-CM | POA: Diagnosis not present

## 2013-07-18 DIAGNOSIS — I495 Sick sinus syndrome: Secondary | ICD-10-CM

## 2013-07-18 DIAGNOSIS — M3313 Other dermatomyositis without myopathy: Secondary | ICD-10-CM

## 2013-07-18 DIAGNOSIS — E119 Type 2 diabetes mellitus without complications: Secondary | ICD-10-CM | POA: Diagnosis not present

## 2013-07-18 DIAGNOSIS — I71019 Dissection of thoracic aorta, unspecified: Secondary | ICD-10-CM

## 2013-07-18 DIAGNOSIS — M3390 Dermatopolymyositis, unspecified, organ involvement unspecified: Secondary | ICD-10-CM | POA: Diagnosis not present

## 2013-07-18 DIAGNOSIS — I7101 Dissection of thoracic aorta: Secondary | ICD-10-CM

## 2013-07-18 DIAGNOSIS — Z950 Presence of cardiac pacemaker: Secondary | ICD-10-CM

## 2013-07-18 NOTE — Progress Notes (Signed)
In office pacemaker interrogation. Normal device function. No changes made this session. 

## 2013-07-18 NOTE — Patient Instructions (Addendum)
Your physician recommends that you schedule a follow-up appointment in: 6 months Your physician recommends that you return for lab work today.

## 2013-07-19 LAB — MICROALBUMIN / CREATININE URINE RATIO
Microalb Creat Ratio: 7.2 mg/g (ref 0.0–30.0)
Microalb, Ur: 0.97 mg/dL (ref 0.00–1.89)

## 2013-07-19 LAB — PACEMAKER DEVICE OBSERVATION
AL AMPLITUDE: 4.3 mv
AL IMPEDENCE PM: 360 Ohm
AL THRESHOLD: 1 V
ATRIAL PACING PM: 100
RV LEAD AMPLITUDE: 18.7 mv
RV LEAD IMPEDENCE PM: 592 Ohm
RV LEAD THRESHOLD: 1 V

## 2013-07-30 ENCOUNTER — Encounter: Payer: Self-pay | Admitting: Cardiovascular Disease

## 2013-07-30 DIAGNOSIS — Z950 Presence of cardiac pacemaker: Secondary | ICD-10-CM | POA: Insufficient documentation

## 2013-07-30 NOTE — Assessment & Plan Note (Addendum)
Her device is approaching elective replacement indicator in this particular generator model is known to have problems with rapid battery depletion. She is not pacemaker dependent but has profound sinus bradycardia at around 40 beats per minute. Will check the device remotely on a monthly basis since it is in the last 6 months or so of service.

## 2013-07-30 NOTE — Assessment & Plan Note (Addendum)
Good control. Check microalbumin level to see if it would be advantageous to start an angiotensin receptor blocker. One out of 3 microalbumin levels over the last couple of years has been abnormal

## 2013-07-30 NOTE — Assessment & Plan Note (Addendum)
Despite combination statin and fenofibrate therapy her triglycerides remain slightly high. Recommendation focus more on avoiding sugars and starches with low glycemic index. No changes are made to medications. Weight loss would be highly beneficial.

## 2013-07-30 NOTE — Progress Notes (Signed)
Patient ID: [name], female   DOB: 03-08-1946, 67 y.o.   MRN: [id number]    Reason for office visit Aortic dissection and pacemaker followup  [name] has done well since her last appointment. She has had 2 episodes of aortic dissection is required to repair the ascending aorta for a type A dissection in 2008 by Dr. Tyrone Sage. Serial imaging followup with CT angiography has shown no progression or aneurysmal changes.  She has a due chamber pacemaker that was implanted in 2007 for symptomatic severe sinus bradycardia. The device is functioning normally by a complete checkup was in the office today. The battery has roughly 6 months of service left. She has a model P1501 device that is prone to rapid battery depletion. Technically she is not pacemaker dependent but she has underlying sinus bradycardiaat only 40 beats per minute (100% atrial pacing, very rare ventricular pacing). So has evidence of intraventricular conduction delay with a QRS duration of 140 ms in a pattern similar in an atypical left bundle branch block.  Early in the year she had a lot of falls. This problem seems to have diminished in frequency.    Allergies  Allergen Reactions  . Oxycodone Itching  . Vicodin [Hydrocodone-Acetaminophen] Itching  . Neomycin-Bacitracin Zn-Polymyx Other (See Comments)    Red , swollen eye  . Sulfonamide Derivatives     Current Outpatient Prescriptions  Medication Sig Dispense Refill  . ALPRAZolam (XANAX) 0.5 MG tablet TAKE 1 TABLET 3 TIMES A DAY AS NEEDED ANXIETY OR SLEEP  90 tablet  0  . aspirin 81 MG tablet Take 81 mg by mouth daily.      . carvedilol (COREG) 12.5 MG tablet Take 12.5 mg by mouth 2 (two) times daily with a meal.        . cholecalciferol (VITAMIN D) 1000 UNITS tablet Take 1,000 Units by mouth daily.      . citalopram (CELEXA) 40 MG tablet TAKE 1 TABLET (40 MG TOTAL) BY MOUTH DAILY.  30 tablet  1  . fenofibrate 160 MG tablet TAKE 1 TABLET (160 MG TOTAL) BY MOUTH  DAILY.  30 tablet  2  . furosemide (LASIX) 40 MG tablet Take 40 mg by mouth daily as needed.      . hyoscyamine (LEVSIN, ANASPAZ) 0.125 MG tablet TAKE ONE TABLET BY MOUTH TWICE DAILY AS NEEDED  60 tablet  2  . JANUVIA 100 MG tablet TAKE 1 TABLET BY MOUTH EVERY DAY  30 tablet  2  . lansoprazole (PREVACID) 15 MG capsule Take 15 mg by mouth as needed.        . rosuvastatin (CRESTOR) 20 MG tablet Take 1 tablet (20 mg total) by mouth daily. 1 tab by mouth at bedtime---repeat labs are due now  30 tablet  2   No current facility-administered medications for this visit.    Past Medical History  Diagnosis Date  . Anxiety   . Hyperlipidemia   . Hypertension   . Diabetes mellitus   . GERD (gastroesophageal reflux disease)   . Cancer     skin  . Kidney stones   . Diverticulosis   . Hemorrhoids   . Acute thoracic aortic dissection     Past Surgical History  Procedure Laterality Date  . Permanent pacemaker    . Abdominal aortic aneurysm repair    . Appendectomy    . Abdominal hysterectomy      Family History  Problem Relation Age of Onset  . Heart disease Father  mi  . Cancer Neg Hx     History   Social History  . Marital Status: Married    Spouse Name: N/A    Number of Children: N/A  . Years of Education: N/A   Occupational History  . Not on file.   Social History Main Topics  . Smoking status: Former Games developer  . Smokeless tobacco: Not on file  . Alcohol Use: No  . Drug Use: No  . Sexual Activity:    Other Topics Concern  . Not on file   Social History Narrative  . No narrative on file    Review of systems: She is frequently anxious. She complains of fatigue. She is very sedentary. She does occasional mild ankle edema The patient specifically denies any chest pain at rest or with exertion, dyspnea at rest or with exertion, orthopnea, paroxysmal nocturnal dyspnea, syncope, palpitations, focal neurological deficits, intermittent claudication, unexplained weight  gain, cough, hemoptysis or wheezing.  The patient also denies abdominal pain, nausea, vomiting, dysphagia, diarrhea, constipation, polyuria, polydipsia, dysuria, hematuria, frequency, urgency, abnormal bleeding or bruising, fever, chills, unexpected weight changes, mood swings, change in skin or hair texture, change in voice quality, auditory or visual problems, allergic reactions or rashes, new musculoskeletal complaints other than usual "aches and pains".   PHYSICAL EXAM BP 140/70  Pulse 81  Resp 20  Ht 5' 0.5" (1.537 m)  Wt 206 lb 9.6 oz (93.713 kg)  BMI 39.67 kg/m2  General: Alert, oriented x3, no distress Head: no evidence of trauma, PERRL, EOMI, no exophtalmos or lid lag, no myxedema, no xanthelasma; normal ears, nose and oropharynx Neck: normal jugular venous pulsations and no hepatojugular reflux; brisk carotid pulses without delay and no carotid bruits Chest: clear to auscultation, no signs of consolidation by percussion or palpation, normal fremitus, symmetrical and full respiratory excursions; healthy sternotomy scar and healthy left subclavian pacemaker site Cardiovascular: normal position and quality of the apical impulse, regular rhythm, normal first and second heart sounds, no murmurs, rubs or gallops Abdomen: no tenderness or distention, no masses by palpation, no abnormal pulsatility or arterial bruits, normal bowel sounds, no hepatosplenomegaly Extremities: no clubbing, cyanosis or edema; 2+ radial, ulnar and brachial pulses bilaterally; 2+ right femoral, posterior tibial and dorsalis pedis pulses; 2+ left femoral, posterior tibial and dorsalis pedis pulses; no subclavian or femoral bruits Neurological: grossly nonfocal  EKG: Atrial paced ventricular sensed, nonspecific intraventricular conduction delay most closely resembling a left bundle branch block, QRS 140 ms.  Lipid Panel     Component Value Date/Time   CHOL 146 05/04/2013 0839   TRIG 174.0* 05/04/2013 0839   HDL  46.70 05/04/2013 0839   CHOLHDL 3 05/04/2013 0839   VLDL 34.8 05/04/2013 0839   LDLCALC 65 05/04/2013 0839    BMET    Component Value Date/Time   NA 142 05/04/2013 0839   K 4.2 05/04/2013 0839   CL 106 05/04/2013 0839   CO2 24 05/04/2013 0839   GLUCOSE 146* 05/04/2013 0839   BUN 18 05/04/2013 0839   CREATININE 0.8 05/04/2013 0839   CALCIUM 9.8 05/04/2013 0839   GFRNONAA >60 06/18/2011 1638   GFRAA >60 06/18/2011 1638     ASSESSMENT AND PLAN 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, status post graft repair the ascending aorta Her aorta and the dissection have been stable on imaging studies. No evidence of expansion of the aorta in the territory of the dissection. She has previous graft  repair of the ascending aorta. Will reevaluate at in about a year and a half (at roughly 24 month intervals)  HYPERTENSION When she checks her blood pressure at home and is relaxed her typical systolic is around 120 mm of mercury. Her diastolic never exceeds 70 mmHg .  HYPERLIPIDEMIA Despite combination statin and fenofibrate therapy her triglycerides remain slightly high. Recommendation focus more on avoiding sugars and starches with low glycemic index. No changes are made to medications. Weight loss would be highly beneficial.  DIABETES MELLITUS, TYPE II Good control. Check microalbumin level to see if it would be advantageous to start an angiotensin receptor blocker. One out of 3 microalbumin levels over the last couple of years has been abnormal  Pacemaker, Medtronic EnRhythm dual chamber, 2007 Her device is approaching elective replacement indicator in this particular generator model is known to have problems with rapid battery depletion. She is not pacemaker dependent but has profound sinus bradycardia at around 40 beats per minute. Will check the device remotely on a monthly basis since it is in the last 6 months or so of service.   Orders Placed This Encounter    Procedures  . Urine Microalbumin w/creat. ratio  . Pacemaker Device Observation  . EKG 12-Lead   Meds ordered this encounter  Medications  . furosemide (LASIX) 40 MG tablet    Sig: Take 40 mg by mouth daily as needed.  . cholecalciferol (VITAMIN D) 1000 UNITS tablet    Sig: Take 1,000 Units by mouth daily.    Junious Silk, MD, [facility] [phone] office [phone] pager

## 2013-07-30 NOTE — Assessment & Plan Note (Signed)
When she checks her blood pressure at home and is relaxed her typical systolic is around 120 mm of mercury. Her diastolic never exceeds 70 mmHg .

## 2013-07-30 NOTE — Assessment & Plan Note (Signed)
Her aorta and the dissection have been stable on imaging studies. No evidence of expansion of the aorta in the territory of the dissection. She has previous graft repair of the ascending aorta. Will reevaluate at in about a year and a half (at roughly 24 month intervals)

## 2013-08-07 ENCOUNTER — Other Ambulatory Visit: Payer: Self-pay | Admitting: Family Medicine

## 2013-08-08 NOTE — Telephone Encounter (Signed)
Last seen 01/09/13 and filled 06/27/13 #90. Please advise      KP

## 2013-08-14 ENCOUNTER — Encounter: Payer: Self-pay | Admitting: *Deleted

## 2013-08-21 ENCOUNTER — Encounter: Payer: Self-pay | Admitting: Cardiovascular Disease

## 2013-08-21 ENCOUNTER — Other Ambulatory Visit: Payer: Self-pay | Admitting: Family Medicine

## 2013-08-22 ENCOUNTER — Ambulatory Visit (INDEPENDENT_AMBULATORY_CARE_PROVIDER_SITE_OTHER): Payer: Medicare Other | Admitting: Cardiovascular Disease

## 2013-08-22 ENCOUNTER — Other Ambulatory Visit: Payer: Self-pay | Admitting: Cardiovascular Disease

## 2013-08-22 ENCOUNTER — Encounter: Payer: Self-pay | Admitting: Cardiovascular Disease

## 2013-08-22 VITALS — BP 144/74 | HR 65 | Ht 60.0 in | Wt 202.0 lb

## 2013-08-22 DIAGNOSIS — I498 Other specified cardiac arrhythmias: Secondary | ICD-10-CM

## 2013-08-22 DIAGNOSIS — Z79899 Other long term (current) drug therapy: Secondary | ICD-10-CM

## 2013-08-22 DIAGNOSIS — R5381 Other malaise: Secondary | ICD-10-CM | POA: Diagnosis not present

## 2013-08-22 DIAGNOSIS — D689 Coagulation defect, unspecified: Secondary | ICD-10-CM | POA: Diagnosis not present

## 2013-08-22 DIAGNOSIS — R001 Bradycardia, unspecified: Secondary | ICD-10-CM

## 2013-08-22 DIAGNOSIS — R5383 Other fatigue: Secondary | ICD-10-CM

## 2013-08-22 DIAGNOSIS — Z45018 Encounter for adjustment and management of other part of cardiac pacemaker: Secondary | ICD-10-CM | POA: Diagnosis not present

## 2013-08-22 DIAGNOSIS — Z950 Presence of cardiac pacemaker: Secondary | ICD-10-CM

## 2013-08-22 LAB — CBC
HCT: 41.7 % (ref 36.0–46.0)
MCH: 32.3 pg (ref 26.0–34.0)
MCHC: 34.1 g/dL (ref 30.0–36.0)
Platelets: 228 10*3/uL (ref 150–400)
RBC: 4.4 MIL/uL (ref 3.87–5.11)

## 2013-08-22 LAB — APTT: aPTT: 30 seconds (ref 24–37)

## 2013-08-22 LAB — PROTIME-INR
INR: 0.97 (ref <1.50)
Prothrombin Time: 12.9 seconds (ref 11.6–15.2)

## 2013-08-22 LAB — PACEMAKER DEVICE OBSERVATION

## 2013-08-22 NOTE — Progress Notes (Signed)
In office pacemaker interrogation. ERI---will schedule for gen change.

## 2013-08-22 NOTE — Patient Instructions (Addendum)
Your physician recommends that you have a pacemaker generator change out, which will be performed at [facility]. Typically the procedure does not require an overnight stay. Being that this will be done at the hospital you are required to have some pre procedure blood work drawn within 7 days of your procedure date. You may not eat or drink prior to your procedure, but you can take all your medications with a sip of water the morning of the change out.

## 2013-08-23 ENCOUNTER — Other Ambulatory Visit: Payer: Self-pay | Admitting: General Practice

## 2013-08-23 ENCOUNTER — Encounter (HOSPITAL_COMMUNITY): Payer: Self-pay | Admitting: Pharmacy Technician

## 2013-08-23 LAB — PACEMAKER DEVICE OBSERVATION
AL AMPLITUDE: 4.8 mv
AL IMPEDENCE PM: 360 Ohm
ATRIAL PACING PM: 34.9
BAMS-0001: 171 {beats}/min
BATTERY VOLTAGE: 2.81 V
PACEART TECH NOTES PM: 1
RV LEAD IMPEDENCE PM: 552 Ohm
RV LEAD THRESHOLD: 1 V
VENTRICULAR PACING PM: 50.6

## 2013-08-23 LAB — COMPREHENSIVE METABOLIC PANEL
Albumin: 4.6 g/dL (ref 3.5–5.2)
Alkaline Phosphatase: 46 U/L (ref 39–117)
BUN: 25 mg/dL — ABNORMAL HIGH (ref 6–23)
CO2: 29 mEq/L (ref 19–32)
Creat: 1 mg/dL (ref 0.50–1.10)
Glucose, Bld: 82 mg/dL (ref 70–99)
Potassium: 4.6 mEq/L (ref 3.5–5.3)
Total Bilirubin: 0.5 mg/dL (ref 0.3–1.2)

## 2013-08-23 MED ORDER — SITAGLIPTIN PHOSPHATE 100 MG PO TABS: ORAL_TABLET | ORAL | Status: DC

## 2013-08-23 NOTE — Assessment & Plan Note (Signed)
Device has reached elective replacement indicator. The risks and benefits of the generator change out discussed in detail with [name]. There is a particularly increased concern for possible infection in view of the previous local infection with initial implantation. Have advised [name] that we should probably use an Aigis pouch to help mitigate the risk of infection. Discussed the risk of possible lead injury need for new lead implantation bleeding. If a new lead is needed and we discussed the potential risks of pneumothorax, perforation and bleeding dislodgment and need for reoperation. This procedure has been fully reviewed with the patient and informed consent has been obtained.

## 2013-08-23 NOTE — Progress Notes (Signed)
Patient ID: [name], female   DOB: 14-Jun-1946, 67 y.o.   MRN: [id number]     Reason for office visit Pacemaker recheck  [name] has a dual-chamber permanent pacemaker implanted in 2007 for sinus node dysfunction that has reached elective replacement indicator. The device is a Medtronic in rhythm that is known to occasionally have problems with rapid battery depletion. It is still functioning normally but has reached end of service.  Is important to note that after her initial pacemaker implantation she did have a superficial infection at the pacemaker site. This required repeated debridement and prolonged treatment with antibiotics.  [name] also has a history of aortic dissection requiring surgical repair in the past as well as mixed hyperlipidemia, obesity, hypertension and diabetes mellitus.  Allergies  Allergen Reactions  . Oxycodone Itching  . Vicodin [Hydrocodone-Acetaminophen] Itching  . Neomycin-Bacitracin Zn-Polymyx Other (See Comments)    Red , swollen eye  . Sulfonamide Derivatives     Just had reaction when taking during pregnancy- last child birth 53    Current Outpatient Prescriptions  Medication Sig Dispense Refill  . aspirin 81 MG tablet Take 81 mg by mouth daily.      . carvedilol (COREG) 12.5 MG tablet Take 12.5 mg by mouth 2 (two) times daily with a meal.        . cholecalciferol (VITAMIN D) 1000 UNITS tablet Take 1,000 Units by mouth daily.      . furosemide (LASIX) 40 MG tablet Take 40 mg by mouth daily as needed for fluid or edema.       . lansoprazole (PREVACID) 15 MG capsule Take 15 mg by mouth as needed (for heart burn).       . ALPRAZolam (XANAX) 0.5 MG tablet Take 0.5 mg by mouth 3 (three) times daily as needed for sleep or anxiety.      . citalopram (CELEXA) 40 MG tablet Take 40 mg by mouth daily.      . fenofibrate 160 MG tablet Take 160 mg by mouth daily.      . hydroxypropyl methylcellulose (ISOPTO TEARS) 2.5 % ophthalmic solution Place 1  drop into both eyes 3 (three) times daily as needed (for dry eyes).      . hyoscyamine (LEVSIN, ANASPAZ) 0.125 MG tablet Take 0.125 mg by mouth 2 (two) times daily as needed for cramping or diarrhea or loose stools.      . rosuvastatin (CRESTOR) 20 MG tablet Take 20 mg by mouth daily.      . sitaGLIPtin (JANUVIA) 100 MG tablet Take 100 mg by mouth daily.       No current facility-administered medications for this visit.    Past Medical History  Diagnosis Date  . Anxiety   . Hyperlipidemia   . Hypertension   . Diabetes mellitus   . GERD (gastroesophageal reflux disease)   . Cancer     skin  . Kidney stones   . Diverticulosis   . Hemorrhoids   . Acute thoracic aortic dissection 2008    emergency surgery - [facility]    Past Surgical History  Procedure Laterality Date  . Pacemaker insertion  08/2006    dual chamber Medtronic EnRhythm; r/t sinus node dysfunction   . Appendectomy    . Abdominal hysterectomy  1984  . Thoracic aortic aneurysm repair  2000    type III  . Repair of acute ascending thoracic aortic dissection  2008    Dr. Tyrone Sage  . Transthoracic echocardiogram  09/20/2012  EF=>55% with mild conc LVH; LA mod dilated; RA mildly dilated; mild MR/TR/AR    Family History  Problem Relation Age of Onset  . Heart attack Father   . Cancer Neg Hx   . Kidney disease Mother   . Heart attack Maternal Grandmother   . Valvular heart disease Son     valve replacement at 78    History   Social History  . Marital Status: Married    Spouse Name: N/A    Number of Children: 2  . Years of Education: N/A   Occupational History  .     Social History Main Topics  . Smoking status: Former Games developer  . Smokeless tobacco: Not on file  . Alcohol Use: No  . Drug Use: No  . Sexual Activity:    Other Topics Concern  . Not on file   Social History Narrative  . No narrative on file    Review of systems: She is frequently anxious. She complains of fatigue. She is very  sedentary. She does occasional mild ankle edema  The patient specifically denies any chest pain at rest or with exertion, dyspnea at rest or with exertion, orthopnea, paroxysmal nocturnal dyspnea, syncope, palpitations, focal neurological deficits, intermittent claudication, unexplained weight gain, cough, hemoptysis or wheezing.  The patient also denies abdominal pain, nausea, vomiting, dysphagia, diarrhea, constipation, polyuria, polydipsia, dysuria, hematuria, frequency, urgency, abnormal bleeding or bruising, fever, chills, unexpected weight changes, mood swings, change in skin or hair texture, change in voice quality, auditory or visual problems, allergic reactions or rashes, new musculoskeletal complaints other than usual "aches and pains".   PHYSICAL EXAM BP 144/74  Pulse 65  Ht 5' (1.524 m)  Wt 202 lb (91.627 kg)  BMI 39.45 kg/m2 General: Alert, oriented x3, no distress  Head: no evidence of trauma, PERRL, EOMI, no exophtalmos or lid lag, no myxedema, no xanthelasma; normal ears, nose and oropharynx  Neck: normal jugular venous pulsations and no hepatojugular reflux; brisk carotid pulses without delay and no carotid bruits  Chest: clear to auscultation, no signs of consolidation by percussion or palpation, normal fremitus, symmetrical and full respiratory excursions; healthy sternotomy scar and healthy left subclavian pacemaker site  Cardiovascular: normal position and quality of the apical impulse, regular rhythm, normal first and second heart sounds, no murmurs, rubs or gallops  Abdomen: no tenderness or distention, no masses by palpation, no abnormal pulsatility or arterial bruits, normal bowel sounds, no hepatosplenomegaly  Extremities: no clubbing, cyanosis or edema; 2+ radial, ulnar and brachial pulses bilaterally; 2+ right femoral, posterior tibial and dorsalis pedis pulses; 2+ left femoral, posterior tibial and dorsalis pedis pulses; no subclavian or femoral bruits  Neurological:  grossly nonfocal  Lipid Panel     Component Value Date/Time   CHOL 146 05/04/2013 0839   TRIG 174.0* 05/04/2013 0839   HDL 46.70 05/04/2013 0839   CHOLHDL 3 05/04/2013 0839   VLDL 34.8 05/04/2013 0839   LDLCALC 65 05/04/2013 0839    BMET    Component Value Date/Time   NA 142 08/22/2013 1610   K 4.6 08/22/2013 1610   CL 103 08/22/2013 1610   CO2 29 08/22/2013 1610   GLUCOSE 82 08/22/2013 1610   BUN 25* 08/22/2013 1610   CREATININE 1.00 08/22/2013 1610   CREATININE 0.8 05/04/2013 0839   CALCIUM 10.1 08/22/2013 1610   GFRNONAA >60 06/18/2011 1638   GFRAA >60 06/18/2011 1638     ASSESSMENT AND PLAN Pacemaker, Medtronic EnRhythm dual chamber, 2007 Device has reached  elective replacement indicator. The risks and benefits of the generator change out discussed in detail with [name]. There is a particularly increased concern for possible infection in view of the previous local infection with initial implantation. Have advised [name] that we should probably use an Aigis pouch to help mitigate the risk of infection. Discussed the risk of possible lead injury need for new lead implantation bleeding. If a new lead is needed and we discussed the potential risks of pneumothorax, perforation and bleeding dislodgment and need for reoperation. This procedure has been fully reviewed with the patient and informed consent has been obtained.    Orders Placed This Encounter  Procedures  . Comp Met (CMET)  . TSH  . INR/PT  . PTT  . CBC  . EKG 12-Lead  . PACEMAKER GENERATOR CHANGE   No orders of the defined types were placed in this encounter.    Junious Silk, MD, [facility] [phone] office [phone] pager

## 2013-08-24 ENCOUNTER — Other Ambulatory Visit: Payer: Self-pay | Admitting: *Deleted

## 2013-08-24 DIAGNOSIS — I495 Sick sinus syndrome: Secondary | ICD-10-CM

## 2013-08-26 MED ORDER — CEFAZOLIN SODIUM-DEXTROSE 2-3 GM-% IV SOLR
2.0000 g | INTRAVENOUS | Status: AC
  Filled 2013-08-26: qty 50

## 2013-08-26 MED ORDER — SODIUM CHLORIDE 0.9 % IR SOLN
80.0000 mg | Status: AC
  Filled 2013-08-26: qty 2

## 2013-08-27 ENCOUNTER — Ambulatory Visit (HOSPITAL_COMMUNITY)
Admission: RE | Admit: 2013-08-27 | Discharge: 2013-08-27 | Disposition: A | Discharge status: Home / Self Care | Payer: Medicare Other | Source: Ambulatory Visit | Attending: Cardiovascular Disease | Admitting: Cardiovascular Disease

## 2013-08-27 ENCOUNTER — Other Ambulatory Visit: Payer: Self-pay | Admitting: Cardiovascular Disease

## 2013-08-27 DIAGNOSIS — Z45018 Encounter for adjustment and management of other part of cardiac pacemaker: Secondary | ICD-10-CM | POA: Insufficient documentation

## 2013-08-27 DIAGNOSIS — I495 Sick sinus syndrome: Secondary | ICD-10-CM

## 2013-08-27 DIAGNOSIS — Z538 Procedure and treatment not carried out for other reasons: Secondary | ICD-10-CM | POA: Insufficient documentation

## 2013-08-29 ENCOUNTER — Ambulatory Visit (HOSPITAL_COMMUNITY)
Admission: RE | Admit: 2013-08-29 | Discharge: 2013-08-29 | Disposition: A | Discharge status: Home / Self Care | Payer: Medicare Other | Source: Ambulatory Visit | Attending: Cardiovascular Disease | Admitting: Cardiovascular Disease

## 2013-08-29 ENCOUNTER — Encounter (HOSPITAL_COMMUNITY): Payer: Self-pay | Admitting: *Deleted

## 2013-08-29 ENCOUNTER — Encounter (HOSPITAL_COMMUNITY)
Admission: RE | Disposition: A | Discharge status: Home / Self Care | Payer: Self-pay | Source: Ambulatory Visit | Attending: Cardiovascular Disease

## 2013-08-29 DIAGNOSIS — Z45018 Encounter for adjustment and management of other part of cardiac pacemaker: Secondary | ICD-10-CM | POA: Diagnosis not present

## 2013-08-29 DIAGNOSIS — I495 Sick sinus syndrome: Secondary | ICD-10-CM | POA: Diagnosis not present

## 2013-08-29 DIAGNOSIS — E785 Hyperlipidemia, unspecified: Secondary | ICD-10-CM | POA: Diagnosis not present

## 2013-08-29 DIAGNOSIS — I447 Left bundle-branch block, unspecified: Secondary | ICD-10-CM | POA: Diagnosis not present

## 2013-08-29 DIAGNOSIS — E119 Type 2 diabetes mellitus without complications: Secondary | ICD-10-CM | POA: Diagnosis not present

## 2013-08-29 DIAGNOSIS — Z9889 Other specified postprocedural states: Secondary | ICD-10-CM | POA: Diagnosis not present

## 2013-08-29 DIAGNOSIS — I10 Essential (primary) hypertension: Secondary | ICD-10-CM | POA: Diagnosis not present

## 2013-08-29 HISTORY — PX: PACEMAKER GENERATOR CHANGE: SHX5481

## 2013-08-29 LAB — GLUCOSE, CAPILLARY
Glucose-Capillary: 102 mg/dL — ABNORMAL HIGH (ref 70–99)
Glucose-Capillary: 102 mg/dL — ABNORMAL HIGH (ref 70–99)

## 2013-08-29 LAB — SURGICAL PCR SCREEN
MRSA, PCR: NEGATIVE
Staphylococcus aureus: NEGATIVE

## 2013-08-29 SURGERY — PACEMAKER GENERATOR CHANGE
Anesthesia: LOCAL

## 2013-08-29 MED ORDER — ONDANSETRON HCL 4 MG/2ML IJ SOLN
INTRAMUSCULAR | Status: AC
  Filled 2013-08-29: qty 2

## 2013-08-29 MED ORDER — LIDOCAINE HCL (PF) 1 % IJ SOLN
INTRAMUSCULAR | Status: AC
  Filled 2013-08-29: qty 60

## 2013-08-29 MED ORDER — ACETAMINOPHEN 325 MG PO TABS
325.0000 mg | ORAL_TABLET | ORAL | Status: DC | PRN
  Filled 2013-08-29: qty 2

## 2013-08-29 MED ORDER — SODIUM CHLORIDE 0.9 % IJ SOLN: 3.0000 mL | INTRAMUSCULAR | Status: DC | PRN

## 2013-08-29 MED ORDER — SODIUM CHLORIDE 0.9 % IV SOLN: INTRAVENOUS | Status: AC

## 2013-08-29 MED ORDER — CEFAZOLIN SODIUM-DEXTROSE 2-3 GM-% IV SOLR
2.0000 g | Freq: Once | INTRAVENOUS | Status: DC
  Filled 2013-08-29 (×2): qty 50

## 2013-08-29 MED ORDER — MUPIROCIN 2 % EX OINT
TOPICAL_OINTMENT | CUTANEOUS | Status: AC
  Filled 2013-08-29: qty 22

## 2013-08-29 MED ORDER — SODIUM CHLORIDE 0.9 % IR SOLN
Freq: Once | Status: DC
  Filled 2013-08-29 (×2): qty 2

## 2013-08-29 MED ORDER — SODIUM CHLORIDE 0.9 % IV SOLN
INTRAVENOUS | Status: DC
  Administered 2013-08-29: 50 mL/h via INTRAVENOUS

## 2013-08-29 MED ORDER — FENTANYL CITRATE 0.05 MG/ML IJ SOLN
INTRAMUSCULAR | Status: AC
  Filled 2013-08-29: qty 2

## 2013-08-29 MED ORDER — MUPIROCIN 2 % EX OINT
TOPICAL_OINTMENT | Freq: Two times a day (BID) | CUTANEOUS | Status: DC
  Administered 2013-08-29: 10:00:00 via NASAL
  Filled 2013-08-29: qty 22

## 2013-08-29 MED ORDER — ONDANSETRON HCL 4 MG/2ML IJ SOLN: 4.0000 mg | Freq: Four times a day (QID) | INTRAMUSCULAR | Status: DC | PRN

## 2013-08-29 NOTE — Interval H&P Note (Signed)
History and Physical Interval Note:  08/29/2013 10:51 AM  [name]  has presented today for surgery, with the diagnosis of pacemaker generator at [facility].  The various methods of treatment have been discussed with the patient and family. After consideration of risks, benefits and other options for treatment, the patient has consented to  Procedure(s): PACEMAKER GENERATOR CHANGE (N/A) as a surgical intervention .  The patient's history has been reviewed, patient examined, no change in status, stable for surgery.  I have reviewed the patient's chart and labs.  Questions were answered to the patient's satisfaction.     [name]

## 2013-08-29 NOTE — H&P (View-Only) (Signed)
In office pacemaker interrogation. ERI---will schedule for gen change.

## 2013-08-29 NOTE — Interval H&P Note (Signed)
History and Physical Interval Note:  08/29/2013 10:50 AM  [name]  has presented today for surgery, with the diagnosis of pacemaker generator at [facility].  The various methods of treatment have been discussed with the patient and family. After consideration of risks, benefits and other options for treatment, the patient has consented to  Procedure(s): PACEMAKER GENERATOR CHANGE (N/A) as a surgical intervention .  The patient's history has been reviewed, patient examined, no change in status, stable for surgery.  I have reviewed the patient's chart and labs.  Questions were answered to the patient's satisfaction.     Karley Pho

## 2013-08-29 NOTE — Op Note (Signed)
Procedure report  Procedure performed:  1. Dual chamber pacemaker generator changeout  2. Light sedation  Reason for procedure:  1. Device generator at elective replacement interval  2. Symptomatic sinus bradycardia Procedure performed by:  Thurmon Fair, MD  Complications:  None  Estimated blood loss:  <5 mL  Medications administered during procedure:  Ancef 2 g intravenously,  lidocaine 1% 30 mL locally, fentanyl 25 mcg intravenously  Device details:   New Generator Medtronic Adapta L,  model number [id number], serial number  [id number]   Right atrial lead (chronic) Medtronic [id number], serial number [id number],(implanted 09/13/2006) Right ventricular lead (chronic)  Medtronic [id number], serial number [id number] (implanted 09/13/2006)  Explanted generator Medtronic Enrhythm model number P1501DR, serial number [id number]  Procedure details:  After the risks and benefits of the procedure were discussed the patient provided informed consent. She was brought to the cardiac catheter lab in the fasting state. The patient was prepped and draped in usual sterile fashion. Local anesthesia with 1% lidocaine was administered to to the left infraclavicular area. A 5-6cm horizontal incision was made parallel with and 2-3 cm caudal to the left clavicle. An older scar was seen closer to the left clavicle. Using minimal electrocautery and mostly sharp and blunt dissection the prepectoral pocket was opened carefully to avoid injury to the loops of chronic leads. Extensive dissection was not necessary. The device was explanted. The pocket was carefully inspected for hemostasis and flushed with copious amounts of antibiotic solution.  The leads were disconnected from the old generator and testing of the lead parameters later showed excellent values. The new generator was connected to the chronic leads, with appropriate pacing noted.   The entire system was then carefully inserted in the pocket with care been  taking that the leads and device assumed a comfortable position without pressure on the incision. Great care was taken that the leads be located deep to the generator. The pocket was then closed in layers using 2 layers of 2-0 Vicryl and cutaneous staples after which a sterile dressing was applied.   At the end of the procedure the following lead parameters were encountered:   Right atrial lead sensed P waves 5.2 mV, impedance 411 ohms, threshold 0.4 at 0.5 ms pulse width.  Right ventricular lead sensed R waves  24 mV, impedance 645 ohms, threshold 0.5 at 0.5 ms pulse width.  Thurmon Fair, MD, [facility] [phone] office [phone] pager

## 2013-08-29 NOTE — H&P (View-Only) (Signed)
Patient ID: [name], female   DOB: 12/23/1945, 67 y.o.   MRN: [id number]    Reason for office visit Aortic dissection and pacemaker followup  [name] has done well since her last appointment. She has had 2 episodes of aortic dissection is required to repair the ascending aorta for a type A dissection in 2008 by Dr. Tyrone Sage. Serial imaging followup with CT angiography has shown no progression or aneurysmal changes.  She has a due chamber pacemaker that was implanted in 2007 for symptomatic severe sinus bradycardia. The device is functioning normally by a complete checkup was in the office today. The battery has roughly 6 months of service left. She has a model P1501 device that is prone to rapid battery depletion. Technically she is not pacemaker dependent but she has underlying sinus bradycardiaat only 40 beats per minute (100% atrial pacing, very rare ventricular pacing). So has evidence of intraventricular conduction delay with a QRS duration of 140 ms in a pattern similar in an atypical left bundle branch block.  Early in the year she had a lot of falls. This problem seems to have diminished in frequency.    Allergies  Allergen Reactions  . Oxycodone Itching  . Vicodin [Hydrocodone-Acetaminophen] Itching  . Neomycin-Bacitracin Zn-Polymyx Other (See Comments)    Red , swollen eye  . Sulfonamide Derivatives     Current Outpatient Prescriptions  Medication Sig Dispense Refill  . ALPRAZolam (XANAX) 0.5 MG tablet TAKE 1 TABLET 3 TIMES A DAY AS NEEDED ANXIETY OR SLEEP  90 tablet  0  . aspirin 81 MG tablet Take 81 mg by mouth daily.      . carvedilol (COREG) 12.5 MG tablet Take 12.5 mg by mouth 2 (two) times daily with a meal.        . cholecalciferol (VITAMIN D) 1000 UNITS tablet Take 1,000 Units by mouth daily.      . citalopram (CELEXA) 40 MG tablet TAKE 1 TABLET (40 MG TOTAL) BY MOUTH DAILY.  30 tablet  1  . fenofibrate 160 MG tablet TAKE 1 TABLET (160 MG TOTAL) BY MOUTH  DAILY.  30 tablet  2  . furosemide (LASIX) 40 MG tablet Take 40 mg by mouth daily as needed.      . hyoscyamine (LEVSIN, ANASPAZ) 0.125 MG tablet TAKE ONE TABLET BY MOUTH TWICE DAILY AS NEEDED  60 tablet  2  . JANUVIA 100 MG tablet TAKE 1 TABLET BY MOUTH EVERY DAY  30 tablet  2  . lansoprazole (PREVACID) 15 MG capsule Take 15 mg by mouth as needed.        . rosuvastatin (CRESTOR) 20 MG tablet Take 1 tablet (20 mg total) by mouth daily. 1 tab by mouth at bedtime---repeat labs are due now  30 tablet  2   No current facility-administered medications for this visit.    Past Medical History  Diagnosis Date  . Anxiety   . Hyperlipidemia   . Hypertension   . Diabetes mellitus   . GERD (gastroesophageal reflux disease)   . Cancer     skin  . Kidney stones   . Diverticulosis   . Hemorrhoids   . Acute thoracic aortic dissection     Past Surgical History  Procedure Laterality Date  . Permanent pacemaker    . Abdominal aortic aneurysm repair    . Appendectomy    . Abdominal hysterectomy      Family History  Problem Relation Age of Onset  . Heart disease Father  mi  . Cancer Neg Hx     History   Social History  . Marital Status: Married    Spouse Name: N/A    Number of Children: N/A  . Years of Education: N/A   Occupational History  . Not on file.   Social History Main Topics  . Smoking status: Former Games developer  . Smokeless tobacco: Not on file  . Alcohol Use: No  . Drug Use: No  . Sexual Activity:    Other Topics Concern  . Not on file   Social History Narrative  . No narrative on file    Review of systems: She is frequently anxious. She complains of fatigue. She is very sedentary. She does occasional mild ankle edema The patient specifically denies any chest pain at rest or with exertion, dyspnea at rest or with exertion, orthopnea, paroxysmal nocturnal dyspnea, syncope, palpitations, focal neurological deficits, intermittent claudication, unexplained weight  gain, cough, hemoptysis or wheezing.  The patient also denies abdominal pain, nausea, vomiting, dysphagia, diarrhea, constipation, polyuria, polydipsia, dysuria, hematuria, frequency, urgency, abnormal bleeding or bruising, fever, chills, unexpected weight changes, mood swings, change in skin or hair texture, change in voice quality, auditory or visual problems, allergic reactions or rashes, new musculoskeletal complaints other than usual "aches and pains".   PHYSICAL EXAM BP 140/70  Pulse 81  Resp 20  Ht 5' 0.5" (1.537 m)  Wt 206 lb 9.6 oz (93.713 kg)  BMI 39.67 kg/m2  General: Alert, oriented x3, no distress Head: no evidence of trauma, PERRL, EOMI, no exophtalmos or lid lag, no myxedema, no xanthelasma; normal ears, nose and oropharynx Neck: normal jugular venous pulsations and no hepatojugular reflux; brisk carotid pulses without delay and no carotid bruits Chest: clear to auscultation, no signs of consolidation by percussion or palpation, normal fremitus, symmetrical and full respiratory excursions; healthy sternotomy scar and healthy left subclavian pacemaker site Cardiovascular: normal position and quality of the apical impulse, regular rhythm, normal first and second heart sounds, no murmurs, rubs or gallops Abdomen: no tenderness or distention, no masses by palpation, no abnormal pulsatility or arterial bruits, normal bowel sounds, no hepatosplenomegaly Extremities: no clubbing, cyanosis or edema; 2+ radial, ulnar and brachial pulses bilaterally; 2+ right femoral, posterior tibial and dorsalis pedis pulses; 2+ left femoral, posterior tibial and dorsalis pedis pulses; no subclavian or femoral bruits Neurological: grossly nonfocal  EKG: Atrial paced ventricular sensed, nonspecific intraventricular conduction delay most closely resembling a left bundle branch block, QRS 140 ms.  Lipid Panel     Component Value Date/Time   CHOL 146 05/04/2013 0839   TRIG 174.0* 05/04/2013 0839   HDL  46.70 05/04/2013 0839   CHOLHDL 3 05/04/2013 0839   VLDL 34.8 05/04/2013 0839   LDLCALC 65 05/04/2013 0839    BMET    Component Value Date/Time   NA 142 05/04/2013 0839   K 4.2 05/04/2013 0839   CL 106 05/04/2013 0839   CO2 24 05/04/2013 0839   GLUCOSE 146* 05/04/2013 0839   BUN 18 05/04/2013 0839   CREATININE 0.8 05/04/2013 0839   CALCIUM 9.8 05/04/2013 0839   GFRNONAA >60 06/18/2011 1638   GFRAA >60 06/18/2011 1638     ASSESSMENT AND PLAN 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, status post graft repair the ascending aorta Her aorta and the dissection have been stable on imaging studies. No evidence of expansion of the aorta in the territory of the dissection. She has previous graft  repair of the ascending aorta. Will reevaluate at in about a year and a half (at roughly 24 month intervals)  HYPERTENSION When she checks her blood pressure at home and is relaxed her typical systolic is around 120 mm of mercury. Her diastolic never exceeds 70 mmHg .  HYPERLIPIDEMIA Despite combination statin and fenofibrate therapy her triglycerides remain slightly high. Recommendation focus more on avoiding sugars and starches with low glycemic index. No changes are made to medications. Weight loss would be highly beneficial.  DIABETES MELLITUS, TYPE II Good control. Check microalbumin level to see if it would be advantageous to start an angiotensin receptor blocker. One out of 3 microalbumin levels over the last couple of years has been abnormal  Pacemaker, Medtronic EnRhythm dual chamber, 2007 Her device is approaching elective replacement indicator in this particular generator model is known to have problems with rapid battery depletion. She is not pacemaker dependent but has profound sinus bradycardia at around 40 beats per minute. Will check the device remotely on a monthly basis since it is in the last 6 months or so of service.   Orders Placed This Encounter    Procedures  . Urine Microalbumin w/creat. ratio  . Pacemaker Device Observation  . EKG 12-Lead   Meds ordered this encounter  Medications  . furosemide (LASIX) 40 MG tablet    Sig: Take 40 mg by mouth daily as needed.  . cholecalciferol (VITAMIN D) 1000 UNITS tablet    Sig: Take 1,000 Units by mouth daily.    Junious Silk, MD, [facility] [phone] office [phone] pager

## 2013-09-04 ENCOUNTER — Ambulatory Visit (INDEPENDENT_AMBULATORY_CARE_PROVIDER_SITE_OTHER): Payer: Self-pay | Admitting: Cardiology

## 2013-09-04 ENCOUNTER — Encounter: Payer: Self-pay | Admitting: Cardiology

## 2013-09-04 VITALS — BP 124/80 | HR 88 | Ht 60.0 in | Wt 207.3 lb

## 2013-09-04 DIAGNOSIS — I71019 Dissection of thoracic aorta, unspecified: Secondary | ICD-10-CM

## 2013-09-04 DIAGNOSIS — Z45010 Encounter for checking and testing of cardiac pacemaker pulse generator [battery]: Secondary | ICD-10-CM

## 2013-09-04 DIAGNOSIS — I7101 Dissection of thoracic aorta: Secondary | ICD-10-CM

## 2013-09-04 DIAGNOSIS — Z45018 Encounter for adjustment and management of other part of cardiac pacemaker: Secondary | ICD-10-CM

## 2013-09-04 DIAGNOSIS — R079 Chest pain, unspecified: Secondary | ICD-10-CM

## 2013-09-04 MED ORDER — HYOSCYAMINE SULFATE 0.125 MG PO TABS: 0.1250 mg | ORAL_TABLET | Freq: Two times a day (BID) | ORAL | Status: DC | PRN

## 2013-09-04 NOTE — Progress Notes (Addendum)
09/04/2013   PCP: Loreen Freud, DO   Chief Complaint  Patient presents with  . Follow-up    pace site check, generator replaced; pains, treating like indigestion, reports better, thinks came from laying on table during procedures; feels like "gap" in chest from front to back; denies SOB; reports more enegery, more awake, less fatigued - can tell difference with having new pacer; minimal LE edema - takes lasix prn    Primary Cardiologist: Dr. Royann Shivers  HPI:  [name] has a dual-chamber permanent pacemaker implanted in 2007 for sinus node dysfunction that has reached elective replacement indicator. The device is a Medtronic in rhythm that is known to occasionally have problems with rapid battery depletion. It is still functioning normally but has reached end of service.  Is important to note that after her initial pacemaker implantation she did have a superficial infection at the pacemaker site. This required repeated debridement and prolonged treatment with antibiotics. Forrest also has a history of aortic dissection requiring surgical repair in the past as well as mixed hyperlipidemia, obesity, hypertension and diabetes mellitus.   She is here today for pacer site check and staple removal which was completed. No signs of infection.  She also relates an emptiness in her chest that occurred after her pacemaker has continued on no shortness of breath she is more aware of it when she is lying in bed she does think the hycosamine has improved the symptoms as well.  She asked for a refill on the high cost and he until she can see Dr. Jarold Motto which I approved.   Allergies  Allergen Reactions  . Oxycodone Itching  . Vicodin [Hydrocodone-Acetaminophen] Itching  . Neomycin-Bacitracin Zn-Polymyx Other (See Comments)    Red , swollen eye  . Sulfonamide Derivatives     Just had reaction when taking during pregnancy- last child birth 67    Current Outpatient Prescriptions    Medication Sig Dispense Refill  . ALPRAZolam (XANAX) 0.5 MG tablet Take 0.5 mg by mouth 3 (three) times daily as needed for sleep or anxiety.      [name] aspirin 81 MG tablet Take 81 mg by mouth daily.      . carvedilol (COREG) 12.5 MG tablet Take 12.5 mg by mouth 2 (two) times daily with a meal.        . cholecalciferol (VITAMIN D) 1000 UNITS tablet Take 1,000 Units by mouth daily.      . citalopram (CELEXA) 40 MG tablet Take 40 mg by mouth daily.      . fenofibrate 160 MG tablet Take 160 mg by mouth daily.      . furosemide (LASIX) 40 MG tablet Take 40 mg by mouth daily as needed for fluid or edema.       . hydroxypropyl methylcellulose (ISOPTO TEARS) 2.5 % ophthalmic solution Place 1 drop into both eyes 3 (three) times daily as needed (for dry eyes).      . hyoscyamine (LEVSIN, ANASPAZ) 0.125 MG tablet Take 1 tablet (0.125 mg total) by mouth 2 (two) times daily as needed for cramping or diarrhea or loose stools.  60 tablet  0  . lansoprazole (PREVACID) 15 MG capsule Take 15 mg by mouth as needed (for heart burn).       . rosuvastatin (CRESTOR) 20 MG tablet Take 20 mg by mouth daily.      . sitaGLIPtin (JANUVIA) 100 MG tablet Take 100 mg by mouth daily.  No current facility-administered medications for this visit.    Past Medical History  Diagnosis Date  . Anxiety   . Hyperlipidemia   . Hypertension   . Diabetes mellitus   . GERD (gastroesophageal reflux disease)   . Cancer     skin  . Kidney stones   . Diverticulosis   . Hemorrhoids   . Acute thoracic aortic dissection 2008    emergency surgery - Gerhardt  . Sinus node dysfunction     hx of PPM    Past Surgical History  Procedure Laterality Date  . Pacemaker insertion  08/2006    dual chamber Medtronic EnRhythm; r/t sinus node dysfunction   . Appendectomy    . Abdominal hysterectomy  1984  . Thoracic aortic aneurysm repair  2000    type III  . Repair of acute ascending thoracic aortic dissection  2008    Dr. Tyrone Sage   . Transthoracic echocardiogram  09/20/2012    EF=>55% with mild conc LVH; LA mod dilated; RA mildly dilated; mild MR/TR/AR  . Pacemaker generator change  2014    Medtronic adapta    JYN:WGNFAOZ:HY colds or fevers, no weight changes Skin:no rashes or ulcers CV:see HPI PUL:see HPI  PHYSICAL EXAM BP 124/80  Pulse 88  Ht 5' (1.524 m)  Wt 207 lb 4.8 oz (94.031 kg)  BMI 40.49 kg/m2 General:Pleasant affect, NAD Skin:Warm and dry, brisk capillary refill HEENT:normocephalic, sclera clear, mucus membranes moist Heart:S1S2 RRR with 2/3 squeaky systolic murmur, no gallup, rub or click Lungs:clear without rales, rhonchi, or wheezes Neuro:alert and oriented, MAE, follows commands, + facial symmetry Paceer site well approximated, no swelling or bleeding.  Site cleaned staples removed and steri strips applied.  ASSESSMENT AND PLAN Pacemaker generator end of life Gen change to Medtronic Adapta. Here for staple removal. Site well approximated, site cleaned and staples removed.  Steri strips applied. To follow up with Dr. Royann Shivers in 4 weeks.   Chest pain at rest Has occurred since PPM gen change.  An emptiness in her chest, gradually improving.  Her hycosamine is helping but she needs renewed and does not see Dr. Jarold Motto until next month refilled for 1 month with no refills.  If discomfort continues, she should call us and we would CT angio.  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, status post graft repair the ascending aorta Stable

## 2013-09-04 NOTE — Patient Instructions (Signed)
Call if increased chest/back discomfort.  Follow up with Dr. Royann Shivers in 1 month

## 2013-09-05 ENCOUNTER — Encounter: Payer: Self-pay | Admitting: Cardiology

## 2013-09-05 DIAGNOSIS — R079 Chest pain, unspecified: Secondary | ICD-10-CM | POA: Insufficient documentation

## 2013-09-05 DIAGNOSIS — Z45010 Encounter for checking and testing of cardiac pacemaker pulse generator [battery]: Secondary | ICD-10-CM | POA: Insufficient documentation

## 2013-09-05 NOTE — Assessment & Plan Note (Signed)
Gen change to Medtronic Adapta. Here for staple removal. Site well approximated, site cleaned and staples removed.  Steri strips applied. To follow up with Dr. Royann Shivers in 4 weeks.

## 2013-09-05 NOTE — Assessment & Plan Note (Signed)
Has occurred since PPM gen change.  An emptiness in her chest, gradually improving.  Her hycosamine is helping but she needs renewed and does not see Dr. Jarold Motto until next month refilled for 1 month with no refills.  If discomfort continues, she should call us and we would CT angio.

## 2013-09-05 NOTE — Assessment & Plan Note (Signed)
Stable

## 2013-09-06 ENCOUNTER — Telehealth: Payer: Self-pay | Admitting: Cardiovascular Disease

## 2013-09-06 NOTE — Telephone Encounter (Signed)
Patient wanted Dr. Royann Shivers and Nada Boozer, NP to know that she is back to 100% normal with no more chest pain since she had her stitches removed.  She also wanted to thank everyone for what they did for her.

## 2013-09-06 NOTE — Telephone Encounter (Signed)
FORWARD TO LAURA AND Dr C.

## 2013-09-07 ENCOUNTER — Other Ambulatory Visit: Payer: Self-pay

## 2013-09-07 MED ORDER — SITAGLIPTIN PHOSPHATE 100 MG PO TABS: 100.0000 mg | ORAL_TABLET | Freq: Every day | ORAL | Status: DC

## 2013-09-18 ENCOUNTER — Other Ambulatory Visit: Payer: Self-pay | Admitting: Family Medicine

## 2013-09-19 NOTE — Telephone Encounter (Signed)
Last seen 01/09/13 and filled 08/07/13 #90. Please advise     KP

## 2013-10-01 ENCOUNTER — Ambulatory Visit (INDEPENDENT_AMBULATORY_CARE_PROVIDER_SITE_OTHER): Payer: Medicare Other | Admitting: Cardiovascular Disease

## 2013-10-01 ENCOUNTER — Encounter: Payer: Self-pay | Admitting: Cardiovascular Disease

## 2013-10-01 VITALS — BP 121/74 | HR 95 | Resp 16 | Ht 60.0 in | Wt 210.8 lb

## 2013-10-01 DIAGNOSIS — R001 Bradycardia, unspecified: Secondary | ICD-10-CM

## 2013-10-01 DIAGNOSIS — I498 Other specified cardiac arrhythmias: Secondary | ICD-10-CM | POA: Diagnosis not present

## 2013-10-01 DIAGNOSIS — I495 Sick sinus syndrome: Secondary | ICD-10-CM

## 2013-10-01 DIAGNOSIS — R079 Chest pain, unspecified: Secondary | ICD-10-CM | POA: Diagnosis not present

## 2013-10-01 DIAGNOSIS — I4892 Unspecified atrial flutter: Secondary | ICD-10-CM

## 2013-10-01 DIAGNOSIS — I7101 Dissection of thoracic aorta: Secondary | ICD-10-CM

## 2013-10-01 DIAGNOSIS — Z950 Presence of cardiac pacemaker: Secondary | ICD-10-CM

## 2013-10-01 DIAGNOSIS — I71019 Dissection of thoracic aorta, unspecified: Secondary | ICD-10-CM

## 2013-10-01 LAB — PACEMAKER DEVICE OBSERVATION
ATRIAL PACING PM: 98.7
BRDY-0002RV: 80 {beats}/min
BRDY-0003RV: 130 {beats}/min
BRDY-0004RV: 130 {beats}/min
RV LEAD AMPLITUDE: 15.68 mv
RV LEAD THRESHOLD: 0.5 V
VENTRICULAR PACING PM: 14.2

## 2013-10-01 MED ORDER — RIVAROXABAN 20 MG PO TABS: 20.0000 mg | ORAL_TABLET | Freq: Every day | ORAL | Status: DC

## 2013-10-01 NOTE — Patient Instructions (Signed)
Your physician has recommended you make the following change in your medication: Xarelto 20mg  daily  Your physician recommends that you schedule a follow-up appointment in: 3-4 weeks

## 2013-10-02 ENCOUNTER — Other Ambulatory Visit: Payer: Self-pay | Admitting: Family Medicine

## 2013-10-04 ENCOUNTER — Other Ambulatory Visit: Payer: Self-pay

## 2013-10-04 ENCOUNTER — Telehealth: Payer: Self-pay | Admitting: *Deleted

## 2013-10-04 MED ORDER — SITAGLIPTIN PHOSPHATE 100 MG PO TABS: 100.0000 mg | ORAL_TABLET | Freq: Every day | ORAL | Status: DC

## 2013-10-04 NOTE — Telephone Encounter (Signed)
I informed patient that [facility] wants her to D/C the ASA while she's taking Xarelto. Patient voiced understanding.   She also mentioned that she can occasionally fell "fluttering" in her chest. I advised the patient to call if her symptoms become more bothersome or if she develops SOB or fatigue before her next appointment. Patient voiced understanding.

## 2013-10-06 ENCOUNTER — Other Ambulatory Visit: Payer: Self-pay | Admitting: Cardiology

## 2013-10-07 DIAGNOSIS — I4892 Unspecified atrial flutter: Secondary | ICD-10-CM | POA: Insufficient documentation

## 2013-10-07 NOTE — Progress Notes (Signed)
Patient ID: [name], female   DOB: 1946-10-25, 67 y.o.   MRN: [id number]      Reason for office visit Followup pacemaker check  Haeleigh recently underwent a pacemaker generator change out for elective replacement indicator. She returns today for device check and we incidentally found that she has been in atrial flutter for over 2 weeks. The arrhythmia appears fairly organized, although at times there is some irregularity and I wonder whether she might have atrial fibrillation. She's completely oblivious to the arrhythmia. Infection feels great and feels that the new pacemaker battery has given her extra energy (I am sure this is a placebo effect). Because of a history of previous superficial pacemaker wound infection, she received an Aigis pouch. A site has healed very nicely.   Allergies  Allergen Reactions  . Oxycodone Itching  . Vicodin [Hydrocodone-Acetaminophen] Itching  . Neomycin-Bacitracin Zn-Polymyx Other (See Comments)    Red , swollen eye  . Sulfonamide Derivatives     Just had reaction when taking during pregnancy- last child birth 60    Current Outpatient Prescriptions  Medication Sig Dispense Refill  . ALPRAZolam (XANAX) 0.5 MG tablet TAKE 1 TABLET 3 TIMES A DAY AS NEEDED ANXIETY OR SLEEP  90 tablet  0  . aspirin 81 MG tablet Take 81 mg by mouth daily.      . carvedilol (COREG) 12.5 MG tablet Take 12.5 mg by mouth 2 (two) times daily with a meal.        . cholecalciferol (VITAMIN D) 1000 UNITS tablet Take 1,000 Units by mouth daily.      . fenofibrate 160 MG tablet Take 160 mg by mouth daily.      . furosemide (LASIX) 40 MG tablet Take 40 mg by mouth daily as needed for fluid or edema.       . hydroxypropyl methylcellulose (ISOPTO TEARS) 2.5 % ophthalmic solution Place 1 drop into both eyes 3 (three) times daily as needed (for dry eyes).      . hyoscyamine (LEVSIN, ANASPAZ) 0.125 MG tablet Take 1 tablet (0.125 mg total) by mouth 2 (two) times daily as needed for  cramping or diarrhea or loose stools.  60 tablet  0  . lansoprazole (PREVACID) 15 MG capsule Take 15 mg by mouth as needed (for heart burn).       . rosuvastatin (CRESTOR) 20 MG tablet Take 20 mg by mouth daily.      . citalopram (CELEXA) 40 MG tablet 1 tab by mouth daily--appointment due now  30 tablet  0  . Rivaroxaban (XARELTO) 20 MG TABS tablet Take 1 tablet (20 mg total) by mouth daily.  20 tablet  0  . sitaGLIPtin (JANUVIA) 100 MG tablet Take 1 tablet (100 mg total) by mouth daily. Labs are due now  30 tablet  0   No current facility-administered medications for this visit.    Past Medical History  Diagnosis Date  . Anxiety   . Hyperlipidemia   . Hypertension   . Diabetes mellitus   . GERD (gastroesophageal reflux disease)   . Cancer     skin  . Kidney stones   . Diverticulosis   . Hemorrhoids   . Acute thoracic aortic dissection 2008    emergency surgery - Gerhardt  . Sinus node dysfunction     hx of PPM    Past Surgical History  Procedure Laterality Date  . Pacemaker insertion  08/2006    dual chamber Medtronic EnRhythm; r/t sinus node dysfunction   .  Appendectomy    . Abdominal hysterectomy  1984  . Thoracic aortic aneurysm repair  2000    type III  . Repair of acute ascending thoracic aortic dissection  2008    Dr. Tyrone Sage  . Transthoracic echocardiogram  09/20/2012    EF=>55% with mild conc LVH; LA mod dilated; RA mildly dilated; mild MR/TR/AR  . Pacemaker generator change  2014    Medtronic adapta    Family History  Problem Relation Age of Onset  . Heart attack Father   . Cancer Neg Hx   . Kidney disease Mother   . Heart attack Maternal Grandmother   . Valvular heart disease Son     valve replacement at 39    History   Social History  . Marital Status: Married    Spouse Name: N/A    Number of Children: 2  . Years of Education: N/A   Occupational History  .     Social History Main Topics  . Smoking status: Former Smoker    Quit date:  09/04/1993  . Smokeless tobacco: Never Used  . Alcohol Use: No  . Drug Use: No  . Sexual Activity: Not on file   Other Topics Concern  . Not on file   Social History Narrative  . No narrative on file    Review of systems: The patient specifically denies any chest pain at rest or with exertion, dyspnea at rest or with exertion, orthopnea, paroxysmal nocturnal dyspnea, syncope, palpitations, focal neurological deficits, intermittent claudication, lower extremity edema, unexplained weight gain, cough, hemoptysis or wheezing.  The patient also denies abdominal pain, nausea, vomiting, dysphagia, diarrhea, constipation, polyuria, polydipsia, dysuria, hematuria, frequency, urgency, abnormal bleeding or bruising, fever, chills, unexpected weight changes, mood swings, change in skin or hair texture, change in voice quality, auditory or visual problems, allergic reactions or rashes, new musculoskeletal complaints other than usual "aches and pains".   PHYSICAL EXAM BP 121/74  Pulse 95  Resp 16  Ht 5' (1.524 m)  Wt 210 lb 12.8 oz (95.618 kg)  BMI 41.17 kg/m2  General: Alert, oriented x3, no distress Head: no evidence of trauma, PERRL, EOMI, no exophtalmos or lid lag, no myxedema, no xanthelasma; normal ears, nose and oropharynx Neck: normal jugular venous pulsations and no hepatojugular reflux; brisk carotid pulses without delay and no carotid bruits Chest: clear to auscultation, no signs of consolidation by percussion or palpation, normal fremitus, symmetrical and full respiratory excursions; Sternotomy scar; right subclavian pacemaker scar healed well Cardiovascular: normal position and quality of the apical impulse, regular rhythm, normal first and second heart sounds, no murmurs, rubs or gallops Abdomen: no tenderness or distention, no masses by palpation, no abnormal pulsatility or arterial bruits, normal bowel sounds, no hepatosplenomegaly Extremities: no clubbing, cyanosis or edema; 2+  radial, ulnar and brachial pulses bilaterally; 2+ right femoral, posterior tibial and dorsalis pedis pulses; 2+ left femoral, posterior tibial and dorsalis pedis pulses; no subclavian or femoral bruits Neurological: grossly nonfocal   Lipid Panel     Component Value Date/Time   CHOL 146 05/04/2013 0839   TRIG 174.0* 05/04/2013 0839   HDL 46.70 05/04/2013 0839   CHOLHDL 3 05/04/2013 0839   VLDL 34.8 05/04/2013 0839   LDLCALC 65 05/04/2013 0839    BMET    Component Value Date/Time   NA 142 08/22/2013 1610   K 4.6 08/22/2013 1610   CL 103 08/22/2013 1610   CO2 29 08/22/2013 1610   GLUCOSE 82 08/22/2013 1610   BUN  25* 08/22/2013 1610   CREATININE 1.00 08/22/2013 1610   CREATININE 0.8 05/04/2013 0839   CALCIUM 10.1 08/22/2013 1610   GFRNONAA >60 06/18/2011 1638   GFRAA >60 06/18/2011 1638     ASSESSMENT AND PLAN Pacemaker - Medtronic Adapta dual chamber Sep 2014, initial implantation 2007 Surgical site has healed well. There is normal pacemaker function. Patient has been in constant AF/AFL since 09-12-2013 with Max A 331, Max V 124, but with an average ventricular rate in the 70s and 80s.  (first episode).  Patient will follow up in three weeks for potential overdrive pacing. Xarelto 20 mg started.   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, status post graft repair the ascending aorta    Atrial flutter She is asymptomatic and does not have rapid ventricular response. There is no indication for urgent cardioversion. I recommended that she start treatment with an oval anticoagulant and we will see if we can perform overdrive pacing if the arrhythmia is still present after 3 weeks. Since the arrhythmia has been ongoing now for quite a while, it is not felt safe to start antiarrhythmics or perform cardioversion without a transesophageal echocardiogram. It does not appear to be in imminent need for return to sinus rhythm so we will try to avoid a TEE. If her  arrhythmia behaves like typical atrial flutter and his recurrent may consider referral for ablation. She has a history of aortic dissection and it might be preferable to "cure" her arrhythmia and try to avoid anticoagulation.   Patient Instructions  Your physician has recommended you make the following change in your medication: Xarelto 20mg  daily  Your physician recommends that you schedule a follow-up appointment in: 3-4 weeks       Orders Placed This Encounter  Procedures  . Pacemaker Device Observation   Meds ordered this encounter  Medications  . Rivaroxaban (XARELTO) 20 MG TABS tablet    Sig: Take 1 tablet (20 mg total) by mouth daily.    Dispense:  20 tablet    Refill:  0    LOT #: [id number] EXP:05-2015, 4 boxes +30 day trial card given    Junious Silk, MD, [facility] [phone] office [phone] pager

## 2013-10-07 NOTE — Assessment & Plan Note (Signed)
She is asymptomatic and does not have rapid ventricular response. There is no indication for urgent cardioversion. I recommended that she start treatment with an oval anticoagulant and we will see if we can perform overdrive pacing if the arrhythmia is still present after 3 weeks. Since the arrhythmia has been ongoing now for quite a while, it is not felt safe to start antiarrhythmics or perform cardioversion without a transesophageal echocardiogram. It does not appear to be in imminent need for return to sinus rhythm so we will try to avoid a TEE. If her arrhythmia behaves like typical atrial flutter and his recurrent may consider referral for ablation. She has a history of aortic dissection and it might be preferable to "cure" her arrhythmia and try to avoid anticoagulation.

## 2013-10-07 NOTE — Assessment & Plan Note (Signed)
Surgical site has healed well. There is normal pacemaker function. Patient has been in constant AF/AFL since 09-12-2013 with Max A 331, Max V 124, but with an average ventricular rate in the 70s and 80s.  (first episode).  Patient will follow up in three weeks for potential overdrive pacing. Xarelto 20 mg started.

## 2013-10-07 NOTE — Assessment & Plan Note (Deleted)
She is asymptomatic and does not have rapid ventricular response. There is no indication for urgent cardioversion. I recommended that she start treatment with an oval anticoagulant and we will see if we can perform overdrive pacing if the arrhythmia is still present after 3 weeks. Since the arrhythmia has been ongoing now for quite a while, it is not felt safe to start antiarrhythmics or perform cardioversion without a transesophageal echocardiogram. It does not appear to be in imminent need for return to sinus rhythm so we will try to avoid a TEE.

## 2013-10-08 ENCOUNTER — Other Ambulatory Visit: Payer: Self-pay | Admitting: Family Medicine

## 2013-10-11 ENCOUNTER — Ambulatory Visit (INDEPENDENT_AMBULATORY_CARE_PROVIDER_SITE_OTHER): Payer: Medicare Other

## 2013-10-11 DIAGNOSIS — Z23 Encounter for immunization: Secondary | ICD-10-CM | POA: Diagnosis not present

## 2013-10-18 ENCOUNTER — Telehealth: Payer: Self-pay | Admitting: Cardiovascular Disease

## 2013-10-18 MED ORDER — RIVAROXABAN 20 MG PO TABS: 20.0000 mg | ORAL_TABLET | Freq: Every day | ORAL | Status: DC

## 2013-10-18 NOTE — Telephone Encounter (Signed)
She has a discount card for [employer] the pharmacist says she needs a prescription to use it. [phone].Please call this today if possible,she is completely out of it.

## 2013-10-18 NOTE — Telephone Encounter (Signed)
Rx sent to pharmacy   

## 2013-10-23 ENCOUNTER — Other Ambulatory Visit: Payer: Self-pay | Admitting: Family Medicine

## 2013-10-23 ENCOUNTER — Ambulatory Visit (INDEPENDENT_AMBULATORY_CARE_PROVIDER_SITE_OTHER): Payer: Medicare Other | Admitting: Cardiovascular Disease

## 2013-10-23 ENCOUNTER — Encounter: Payer: Self-pay | Admitting: Cardiovascular Disease

## 2013-10-23 VITALS — BP 112/62 | HR 83 | Resp 16 | Ht 60.0 in | Wt 209.3 lb

## 2013-10-23 DIAGNOSIS — Z950 Presence of cardiac pacemaker: Secondary | ICD-10-CM

## 2013-10-23 DIAGNOSIS — I4892 Unspecified atrial flutter: Secondary | ICD-10-CM | POA: Diagnosis not present

## 2013-10-23 LAB — MDC_IDC_ENUM_SESS_TYPE_INCLINIC
Brady Statistic AP VP Percent: 5.6 %
Brady Statistic AP VS Percent: 0 %
Brady Statistic AS VP Percent: 83.1 %
Brady Statistic AS VS Percent: 11.3 %
Lead Channel Impedance Value: 425 Ohm
Lead Channel Impedance Value: 629 Ohm
Lead Channel Pacing Threshold Amplitude: 0.5 V
Lead Channel Pacing Threshold Pulse Width: 0.4 ms
Lead Channel Sensing Intrinsic Amplitude: 5.6 mV
Lead Channel Setting Pacing Amplitude: 1.5 V
Lead Channel Setting Pacing Amplitude: 2 V
Lead Channel Setting Sensing Sensitivity: 4 mV

## 2013-10-23 LAB — PACEMAKER DEVICE OBSERVATION

## 2013-10-23 MED ORDER — RIVAROXABAN 20 MG PO TABS: 20.0000 mg | ORAL_TABLET | Freq: Every day | ORAL | Status: DC

## 2013-10-23 NOTE — Progress Notes (Signed)
Patient ID: [name], female   DOB: 1946-11-08, 67 y.o.   MRN: [id number]     Reason for office visit Atrial flutter  She returns for atrial flutter overdrive pacing, after roughly 3-4 weeks of uninterrupted full dose anticoagulation therapy. No embolic or bleeding events have occurred. It remains unclear whether the arrhythmia is at all symptomatic. It has been persistent for about 6 weeks. There is no rapid ventricular response.   She also has a history of SSS, s/p pacemaker with recent generator change and a history of aortic dissection, type A with surgical repair of the ascending aorta.  Normal pacemaker interrogation. The device was successfully used  today for AFl overdrive pacing with return to ApVp rhythm.   Allergies  Allergen Reactions  . Oxycodone Itching  . Vicodin [Hydrocodone-Acetaminophen] Itching  . Neomycin-Bacitracin Zn-Polymyx Other (See Comments)    Red , swollen eye  . Sulfonamide Derivatives     Just had reaction when taking during pregnancy- last child birth 63    Current Outpatient Prescriptions  Medication Sig Dispense Refill  . carvedilol (COREG) 12.5 MG tablet Take 12.5 mg by mouth 2 (two) times daily with a meal.        . cholecalciferol (VITAMIN D) 1000 UNITS tablet Take 1,000 Units by mouth daily.      . citalopram (CELEXA) 40 MG tablet 1 tab by mouth daily--appointment due now  30 tablet  0  . fenofibrate 160 MG tablet Take 160 mg by mouth daily.      . furosemide (LASIX) 40 MG tablet Take 40 mg by mouth daily as needed for fluid or edema.       . hydroxypropyl methylcellulose (ISOPTO TEARS) 2.5 % ophthalmic solution Place 1 drop into both eyes 3 (three) times daily as needed (for dry eyes).      . hyoscyamine (LEVSIN SL) 0.125 MG SL tablet TAKE 1 TABLET BY MOUTH TWICE A DAY AS NEEDED FOR CRAMPING OR DIARRHEA OR LOOSE STOOLS  60 tablet  0  . lansoprazole (PREVACID) 15 MG capsule Take 15 mg by mouth as needed (for heart burn).       .  Rivaroxaban (XARELTO) 20 MG TABS tablet Take 1 tablet (20 mg total) by mouth daily with supper.  15 tablet  0  . rosuvastatin (CRESTOR) 20 MG tablet Take 20 mg by mouth daily.      . sitaGLIPtin (JANUVIA) 100 MG tablet Take 1 tablet (100 mg total) by mouth daily. Labs are due now  30 tablet  0  . ALPRAZolam (XANAX) 0.5 MG tablet TAKE 1 TABLET 3 TIMES A DAY AS NEEDED ANXIETY OR SLEEP  90 tablet  0   No current facility-administered medications for this visit.    Past Medical History  Diagnosis Date  . Anxiety   . Hyperlipidemia   . Hypertension   . Diabetes mellitus   . GERD (gastroesophageal reflux disease)   . Cancer     skin  . Kidney stones   . Diverticulosis   . Hemorrhoids   . Acute thoracic aortic dissection 2008    emergency surgery - Gerhardt  . Sinus node dysfunction     hx of PPM    Past Surgical History  Procedure Laterality Date  . Pacemaker insertion  08/2006    dual chamber Medtronic EnRhythm; r/t sinus node dysfunction   . Appendectomy    . Abdominal hysterectomy  1984  . Thoracic aortic aneurysm repair  2000    type III  .  Repair of acute ascending thoracic aortic dissection  2008    Dr. Tyrone Sage  . Transthoracic echocardiogram  09/20/2012    EF=>55% with mild conc LVH; LA mod dilated; RA mildly dilated; mild MR/TR/AR  . Pacemaker generator change  2014    Medtronic adapta    Family History  Problem Relation Age of Onset  . Heart attack Father   . Cancer Neg Hx   . Kidney disease Mother   . Heart attack Maternal Grandmother   . Valvular heart disease Son     valve replacement at 64    History   Social History  . Marital Status: Married    Spouse Name: N/A    Number of Children: 2  . Years of Education: N/A   Occupational History  .     Social History Main Topics  . Smoking status: Former Smoker    Quit date: 09/04/1993  . Smokeless tobacco: Never Used  . Alcohol Use: No  . Drug Use: No  . Sexual Activity: Not on file   Other  Topics Concern  . Not on file   Social History Narrative  . No narrative on file    Review of systems: The patient specifically denies any chest pain at rest or with exertion, dyspnea at rest or with exertion, orthopnea, paroxysmal nocturnal dyspnea, syncope, palpitations, focal neurological deficits, intermittent claudication, lower extremity edema, unexplained weight gain, cough, hemoptysis or wheezing.  The patient also denies abdominal pain, nausea, vomiting, dysphagia, diarrhea, constipation, polyuria, polydipsia, dysuria, hematuria, frequency, urgency, abnormal bleeding or bruising, fever, chills, unexpected weight changes, mood swings, change in skin or hair texture, change in voice quality, auditory or visual problems, allergic reactions or rashes, new musculoskeletal complaints other than usual "aches and pains".   PHYSICAL EXAM BP 112/62  Pulse 83  Resp 16  Ht 5' (1.524 m)  Wt 209 lb 4.8 oz (94.938 kg)  BMI 40.88 kg/m2 General: Alert, oriented x3, no distress  Head: no evidence of trauma, PERRL, EOMI, no exophtalmos or lid lag, no myxedema, no xanthelasma; normal ears, nose and oropharynx  Neck: normal jugular venous pulsations and no hepatojugular reflux; brisk carotid pulses without delay and no carotid bruits  Chest: clear to auscultation, no signs of consolidation by percussion or palpation, normal fremitus, symmetrical and full respiratory excursions; Sternotomy scar; left subclavian pacemaker scar healed well  Cardiovascular: normal position and quality of the apical impulse, regular rhythm, normal first and second heart sounds, no murmurs, rubs or gallops  Abdomen: no tenderness or distention, no masses by palpation, no abnormal pulsatility or arterial bruits, normal bowel sounds, no hepatosplenomegaly  Extremities: no clubbing, cyanosis or edema; 2+ radial, ulnar and brachial pulses bilaterally; 2+ right femoral, posterior tibial and dorsalis pedis pulses; 2+ left femoral,  posterior tibial and dorsalis pedis pulses; no subclavian or femoral bruits  Neurological: grossly nonfocal   Lipid Panel     Component Value Date/Time   CHOL 146 05/04/2013 0839   TRIG 174.0* 05/04/2013 0839   HDL 46.70 05/04/2013 0839   CHOLHDL 3 05/04/2013 0839   VLDL 34.8 05/04/2013 0839   LDLCALC 65 05/04/2013 0839    BMET    Component Value Date/Time   NA 142 08/22/2013 1610   K 4.6 08/22/2013 1610   CL 103 08/22/2013 1610   CO2 29 08/22/2013 1610   GLUCOSE 82 08/22/2013 1610   BUN 25* 08/22/2013 1610   CREATININE 1.00 08/22/2013 1610   CREATININE 0.8 05/04/2013 0839   CALCIUM  10.1 08/22/2013 1610   GFRNONAA >60 06/18/2011 1638   GFRAA >60 06/18/2011 1638     ASSESSMENT AND PLAN Atrial flutter After several attempts at overdrive pacing using her atrial lead (arrhythmia cycle length 230 ms; attempts at 224 ms, , 188 ms, 164 ms) the rhythm briefly converted to atrial fibrillation, which self terminated to ApVp rhythm. No complications occurred. She is reminded of the mandatory need for uninterrupted anticoagulation, even more critical in the next month. Will need to assess for any improvement in functional status and assess durability of regular rhythm - reevaluate in roughly 1 month.  Pacemaker - Medtronic Adapta dual chamber Sep 2014, initial implantation 2007     Patient Instructions  Your physician recommends that you schedule a follow-up appointment in: 11-20-2013 @ 2:45pm with Dr.Dioselina Brumbaugh       Meds ordered this encounter  Medications  . Rivaroxaban (XARELTO) 20 MG TABS tablet    Sig: Take 1 tablet (20 mg total) by mouth daily with supper.    Dispense:  15 tablet    Refill:  0    Lot# [id number] EXP: 7/17    Junious Silk, MD, [facility] [place] [facility] [phone] office [phone] pager

## 2013-10-23 NOTE — Patient Instructions (Signed)
Your physician recommends that you schedule a follow-up appointment in: 11-20-2013 @ 2:45pm with Dr.Croitoru

## 2013-10-23 NOTE — Assessment & Plan Note (Addendum)
After several attempts at overdrive pacing using her atrial lead (arrhythmia cycle length 230 ms; attempts at 224 ms, , 188 ms, 164 ms) the rhythm briefly converted to atrial fibrillation, which self terminated to ApVp rhythm. No complications occurred. She is reminded of the mandatory need for uninterrupted anticoagulation, even more critical in the next month. Will need to assess for any improvement in functional status and assess durability of regular rhythm - reevaluate in roughly 1 month.

## 2013-10-23 NOTE — Telephone Encounter (Signed)
Last seen 01/09/13 and filled 09/18/13 #90. Please advise       KP

## 2013-11-05 ENCOUNTER — Telehealth: Payer: Self-pay | Admitting: Cardiovascular Disease

## 2013-11-05 NOTE — Telephone Encounter (Signed)
Patient is going to be sending information that she received from [facility] regarding her prescription for Xarelto.  It has been denied and she wants Dr. Royann Shivers to send an appeal regarding this issue.

## 2013-11-06 NOTE — Telephone Encounter (Signed)
Called patient's pharmacy and verified that PA for xarelto will need to be completed - they will fax over form. Notified patient of this and to call office to request samples if should get low on supplies until PA is approved. Patient agreed with plan.

## 2013-11-20 ENCOUNTER — Encounter: Payer: Self-pay | Admitting: Cardiovascular Disease

## 2013-11-20 ENCOUNTER — Ambulatory Visit (INDEPENDENT_AMBULATORY_CARE_PROVIDER_SITE_OTHER): Payer: Medicare Other | Admitting: Cardiovascular Disease

## 2013-11-20 VITALS — BP 148/78 | HR 80 | Ht 60.0 in | Wt 208.6 lb

## 2013-11-20 DIAGNOSIS — I4892 Unspecified atrial flutter: Secondary | ICD-10-CM | POA: Diagnosis not present

## 2013-11-20 DIAGNOSIS — I7101 Dissection of thoracic aorta: Secondary | ICD-10-CM

## 2013-11-20 DIAGNOSIS — Z950 Presence of cardiac pacemaker: Secondary | ICD-10-CM | POA: Diagnosis not present

## 2013-11-20 DIAGNOSIS — I71019 Dissection of thoracic aorta, unspecified: Secondary | ICD-10-CM

## 2013-11-20 LAB — MDC_IDC_ENUM_SESS_TYPE_INCLINIC
Battery Impedance: 100 Ohm
Battery Remaining Longevity: 12.5
Brady Statistic AP VP Percent: 8 %
Brady Statistic AP VS Percent: 91.9 %
Brady Statistic AS VP Percent: 0.1 % — CL
Brady Statistic AS VS Percent: 0.1 % — CL
Lead Channel Impedance Value: 426 Ohm
Lead Channel Impedance Value: 676 Ohm
Lead Channel Pacing Threshold Amplitude: 1 V
Lead Channel Pacing Threshold Pulse Width: 0.4 ms
Lead Channel Sensing Intrinsic Amplitude: 5.6 mV
Lead Channel Setting Pacing Amplitude: 1.5 V
Lead Channel Setting Pacing Amplitude: 2 V
Lead Channel Setting Sensing Sensitivity: 4 mV

## 2013-11-20 LAB — PACEMAKER DEVICE OBSERVATION

## 2013-11-20 MED ORDER — CARVEDILOL 25 MG PO TABS: 25.0000 mg | ORAL_TABLET | Freq: Two times a day (BID) | ORAL | Status: DC

## 2013-11-20 NOTE — Patient Instructions (Addendum)
Remote monitoring is used to monitor your Pacemaker of ICD from home. This monitoring reduces the number of office visits required to check your device to one time per year. It allows [name] to keep an eye on the functioning of your device to ensure it is working properly. You are scheduled for a device check from home on February 20 2014. You may send your transmission at any time that day. If you have a wireless device, the transmission will be sent automatically. After your physician reviews your transmission, you will receive a postcard with your next transmission date.  Your physician recommends that you schedule a follow-up appointment in: 6 MONTHS WITH DR CROITORU   Increase coreg to 25 mg twice a day

## 2013-11-27 ENCOUNTER — Other Ambulatory Visit: Payer: Self-pay | Admitting: *Deleted

## 2013-11-27 DIAGNOSIS — I7101 Dissection of thoracic aorta: Secondary | ICD-10-CM

## 2013-11-27 DIAGNOSIS — I71019 Dissection of thoracic aorta, unspecified: Secondary | ICD-10-CM

## 2013-11-28 ENCOUNTER — Other Ambulatory Visit: Payer: Self-pay | Admitting: Family Medicine

## 2013-11-29 NOTE — Assessment & Plan Note (Signed)
Normal device function. Successfully used for overdrive pacing of atrial flutter.

## 2013-11-29 NOTE — Assessment & Plan Note (Signed)
Important to keep BP in low normal range. Increase beta blocker dose.

## 2013-11-29 NOTE — Progress Notes (Signed)
Patient ID: [name], female   DOB: 1946-01-02, 68 y.o.   MRN: [id number]     Reason for office visit Atrial flutter  She returns for atrial flutter followup, roughly 3-4 weeks after overdrive pacing, without interim embolic or bleeding events on anticoagulation therapy.It remains unclear whether the arrhythmia is at all symptomatic. She also has a history of SSS, s/p pacemaker with recent generator change and a history of aortic dissection, type A with surgical repair of the ascending aorta.  Normal pacemaker interrogation. The device wahows 100% atrial pacing, infrequent ventricular pacing.  Allergies  Allergen Reactions  . Oxycodone Itching  . Vicodin [Hydrocodone-Acetaminophen] Itching  . Neomycin-Bacitracin Zn-Polymyx Other (See Comments)    Red , swollen eye  . Sulfonamide Derivatives     Just had reaction when taking during pregnancy- last child birth 48    Current Outpatient Prescriptions  Medication Sig Dispense Refill  . ALPRAZolam (XANAX) 0.5 MG tablet TAKE 1 TABLET 3 TIMES A DAY AS NEEDED ANXIETY OR SLEEP  90 tablet  0  . cholecalciferol (VITAMIN D) 1000 UNITS tablet Take 1,000 Units by mouth daily.      . citalopram (CELEXA) 40 MG tablet 1 tab by mouth daily--appointment due now  30 tablet  0  . fenofibrate 160 MG tablet Take 160 mg by mouth daily.      . furosemide (LASIX) 40 MG tablet Take 40 mg by mouth daily as needed for fluid or edema.       . hyoscyamine (LEVSIN SL) 0.125 MG SL tablet TAKE 1 TABLET BY MOUTH TWICE A DAY AS NEEDED FOR CRAMPING OR DIARRHEA OR LOOSE STOOLS  60 tablet  0  . lansoprazole (PREVACID) 15 MG capsule Take 15 mg by mouth as needed (for heart burn).       . Rivaroxaban (XARELTO) 20 MG TABS tablet Take 1 tablet (20 mg total) by mouth daily with supper.  15 tablet  0  . sitaGLIPtin (JANUVIA) 100 MG tablet Take 1 tablet (100 mg total) by mouth daily. Labs are due now  30 tablet  0  . carvedilol (COREG) 25 MG tablet Take 1 tablet (25 mg  total) by mouth 2 (two) times daily.  180 tablet  3  . hydroxypropyl methylcellulose (ISOPTO TEARS) 2.5 % ophthalmic solution Place 1 drop into both eyes 3 (three) times daily as needed (for dry eyes).      . rosuvastatin (CRESTOR) 20 MG tablet 1 tab by mouth at bedtime--labs are due now  30 tablet  0   No current facility-administered medications for this visit.    Past Medical History  Diagnosis Date  . Anxiety   . Hyperlipidemia   . Hypertension   . Diabetes mellitus   . GERD (gastroesophageal reflux disease)   . Cancer     skin  . Kidney stones   . Diverticulosis   . Hemorrhoids   . Acute thoracic aortic dissection 2008    emergency surgery - Gerhardt  . Sinus node dysfunction     hx of PPM    Past Surgical History  Procedure Laterality Date  . Pacemaker insertion  08/2006    dual chamber Medtronic EnRhythm; r/t sinus node dysfunction   . Appendectomy    . Abdominal hysterectomy  1984  . Thoracic aortic aneurysm repair  2000    type III  . Repair of acute ascending thoracic aortic dissection  2008    Dr. Servando Snare  . Transthoracic echocardiogram  09/20/2012  EF=>55% with mild conc LVH; LA mod dilated; RA mildly dilated; mild MR/TR/AR  . Pacemaker generator change  2014    Medtronic adapta    Family History  Problem Relation Age of Onset  . Heart attack Father   . Cancer Neg Hx   . Kidney disease Mother   . Heart attack Maternal Grandmother   . Valvular heart disease Son     valve replacement at 36    History   Social History  . Marital Status: Married    Spouse Name: N/A    Number of Children: 2  . Years of Education: N/A   Occupational History  .     Social History Main Topics  . Smoking status: Former Smoker    Quit date: 09/04/1993  . Smokeless tobacco: Never Used  . Alcohol Use: No  . Drug Use: No  . Sexual Activity: Not on file   Other Topics Concern  . Not on file   Social History Narrative  . No narrative on file    Review of  systems: The patient specifically denies any chest pain at rest or with exertion, dyspnea at rest or with exertion, orthopnea, paroxysmal nocturnal dyspnea, syncope, palpitations, focal neurological deficits, intermittent claudication, lower extremity edema, unexplained weight gain, cough, hemoptysis or wheezing.  The patient also denies abdominal pain, nausea, vomiting, dysphagia, diarrhea, constipation, polyuria, polydipsia, dysuria, hematuria, frequency, urgency, abnormal bleeding or bruising, fever, chills, unexpected weight changes, mood swings, change in skin or hair texture, change in voice quality, auditory or visual problems, allergic reactions or rashes, new musculoskeletal complaints other than usual "aches and pains".   PHYSICAL EXAM BP 148/78  Pulse 80  Ht 5' (1.524 m)  Wt 208 lb 9.6 oz (94.62 kg)  BMI 40.74 kg/m2 General: Alert, oriented x3, no distress  Head: no evidence of trauma, PERRL, EOMI, no exophtalmos or lid lag, no myxedema, no xanthelasma; normal ears, nose and oropharynx  Neck: normal jugular venous pulsations and no hepatojugular reflux; brisk carotid pulses without delay and no carotid bruits  Chest: clear to auscultation, no signs of consolidation by percussion or palpation, normal fremitus, symmetrical and full respiratory excursions; Sternotomy scar; left subclavian pacemaker scar healed well  Cardiovascular: normal position and quality of the apical impulse, regular rhythm, normal first and second heart sounds, no murmurs, rubs or gallops  Abdomen: no tenderness or distention, no masses by palpation, no abnormal pulsatility or arterial bruits, normal bowel sounds, no hepatosplenomegaly  Extremities: no clubbing, cyanosis or edema; 2+ radial, ulnar and brachial pulses bilaterally; 2+ right femoral, posterior tibial and dorsalis pedis pulses; 2+ left femoral, posterior tibial and dorsalis pedis pulses; no subclavian or femoral bruits  Neurological: grossly  nonfocal   EKG: A paced V sensed  Lipid Panel     Component Value Date/Time   CHOL 146 05/04/2013 0839   TRIG 174.0* 05/04/2013 0839   HDL 46.70 05/04/2013 0839   CHOLHDL 3 05/04/2013 0839   VLDL 34.8 05/04/2013 0839   LDLCALC 65 05/04/2013 0839    BMET    Component Value Date/Time   NA 142 08/22/2013 1610   K 4.6 08/22/2013 1610   CL 103 08/22/2013 1610   CO2 29 08/22/2013 1610   GLUCOSE 82 08/22/2013 1610   BUN 25* 08/22/2013 1610   CREATININE 1.00 08/22/2013 1610   CREATININE 0.8 05/04/2013 0839   CALCIUM 10.1 08/22/2013 1610   GFRNONAA >60 06/18/2011 1638   GFRAA >60 06/18/2011 1638     ASSESSMENT  AND PLAN Atrial flutter Continue lifelong anticoagulation, No indication for antiarrhythmics at this point.  Pacemaker - Medtronic Adapta dual chamber Sep 2014, initial implantation 2007 Normal device function. Successfully used for overdrive pacing of atrial flutter.  Atrial flutter Continue lifelong anticoagulation, No indication for antiarrhythmics at this point.  Pacemaker - Medtronic Adapta dual chamber Sep 2014, initial implantation 2007 Normal device function. Successfully used for overdrive pacing of atrial flutter.  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, status post graft repair the ascending aorta Important to keep BP in low normal range. Increase beta blocker dose.   Patient Instructions  Remote monitoring is used to monitor your Pacemaker of ICD from home. This monitoring reduces the number of office visits required to check your device to one time per year. It allows [name] to keep an eye on the functioning of your device to ensure it is working properly. You are scheduled for a device check from home on February 20 2014. You may send your transmission at any time that day. If you have a wireless device, the transmission will be sent automatically. After your physician reviews your transmission, you will receive a postcard with your next  transmission date.  Your physician recommends that you schedule a follow-up appointment in: 6 MONTHS WITH DR Aleathea Pugmire   Increase coreg to 25 mg twice a day     Orders Placed This Encounter  Procedures  . Implantable device check   Meds ordered this encounter  Medications  . carvedilol (COREG) 25 MG tablet    Sig: Take 1 tablet (25 mg total) by mouth 2 (two) times daily.    Dispense:  180 tablet    Refill:  Joshua Char Feltman, MD, [facility] [phone] office [phone] pager

## 2013-11-29 NOTE — Assessment & Plan Note (Signed)
Continue lifelong anticoagulation, No indication for antiarrhythmics at this point.

## 2013-12-03 ENCOUNTER — Telehealth: Payer: Self-pay | Admitting: Cardiovascular Disease

## 2013-12-03 ENCOUNTER — Other Ambulatory Visit: Payer: Self-pay | Admitting: *Deleted

## 2013-12-03 DIAGNOSIS — I71019 Dissection of thoracic aorta, unspecified: Secondary | ICD-10-CM

## 2013-12-03 DIAGNOSIS — I7101 Dissection of thoracic aorta: Secondary | ICD-10-CM

## 2013-12-03 NOTE — Telephone Encounter (Signed)
Please call [phone] for prior authorization for her Xarelto and then call [facility]  [address]---[phone].  Patient states that she is almost out of medication.

## 2013-12-03 NOTE — Telephone Encounter (Signed)
Forwarded to [place], [place].

## 2013-12-04 NOTE — Telephone Encounter (Signed)
[name] got the prior authorization for Xarelto this AM.  Patient and pharmacy notified.

## 2013-12-06 ENCOUNTER — Telehealth: Payer: Self-pay | Admitting: Cardiovascular Disease

## 2013-12-06 NOTE — Telephone Encounter (Signed)
I agree that with her personal and family medical problems she should be excused from jury duty. Can you please make her a letter?

## 2013-12-06 NOTE — Telephone Encounter (Signed)
Forwarding to Dr.C

## 2013-12-06 NOTE — Telephone Encounter (Signed)
Patient contacted the office to request a doctor's note excusing her from jury duty. 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 Please contact the patient for further information. The patient must provide documentation to opt out of jury duty by Dec 13, 2013.

## 2013-12-07 ENCOUNTER — Other Ambulatory Visit: Payer: Self-pay | Admitting: Family Medicine

## 2013-12-07 ENCOUNTER — Encounter: Payer: Self-pay | Admitting: *Deleted

## 2013-12-07 NOTE — Telephone Encounter (Signed)
Letter mailed to patient to get her excused from [employer].

## 2013-12-10 NOTE — Telephone Encounter (Signed)
Last seen 01/09/13 and filled 10/23/13 #90. Please advise    KP

## 2013-12-13 ENCOUNTER — Other Ambulatory Visit: Payer: Self-pay | Admitting: Family Medicine

## 2014-01-01 DIAGNOSIS — I7101 Dissection of thoracic aorta: Secondary | ICD-10-CM | POA: Diagnosis not present

## 2014-01-01 DIAGNOSIS — I71019 Dissection of thoracic aorta, unspecified: Secondary | ICD-10-CM | POA: Diagnosis not present

## 2014-01-02 LAB — CREATININE, SERUM: Creat: 0.74 mg/dL (ref 0.50–1.10)

## 2014-01-02 LAB — BUN: BUN: 17 mg/dL (ref 6–23)

## 2014-01-03 ENCOUNTER — Ambulatory Visit
Admission: RE | Admit: 2014-01-03 | Discharge: 2014-01-03 | Disposition: A | Discharge status: Home / Self Care | Payer: Medicare Other | Source: Ambulatory Visit | Attending: Cardiothoracic Surgery | Admitting: Cardiothoracic Surgery

## 2014-01-03 ENCOUNTER — Ambulatory Visit (INDEPENDENT_AMBULATORY_CARE_PROVIDER_SITE_OTHER): Payer: Medicare Other | Admitting: Cardiothoracic Surgery

## 2014-01-03 ENCOUNTER — Encounter: Payer: Self-pay | Admitting: Cardiothoracic Surgery

## 2014-01-03 VITALS — BP 148/88 | HR 92 | Resp 20 | Ht 60.0 in | Wt 198.0 lb

## 2014-01-03 DIAGNOSIS — I7101 Dissection of thoracic aorta: Secondary | ICD-10-CM

## 2014-01-03 DIAGNOSIS — I71019 Dissection of thoracic aorta, unspecified: Secondary | ICD-10-CM

## 2014-01-03 DIAGNOSIS — I719 Aortic aneurysm of unspecified site, without rupture: Secondary | ICD-10-CM | POA: Diagnosis not present

## 2014-01-03 MED ORDER — IOHEXOL 350 MG/ML SOLN
100.0000 mL | Freq: Once | INTRAVENOUS | Status: AC | PRN
  Administered 2014-01-03: 100 mL via INTRAVENOUS

## 2014-01-03 NOTE — Progress Notes (Signed)
[name] Record [id number] Date of Birth: [birth year]  Referring: Anna Chessman, DO Primary Care: Anna Koyanagi, DO  Chief Complaint:    Chief Complaint  Patient presents with  . Follow-up    18 month f/u with CTA Chest   09/02/2007  OPERATIVE REPORT  PREOPERATIVE DIAGNOSIS: Acute type A aortic dissection.  POSTOPERATIVE DIAGNOSIS: Acute type A aortic dissection.  PROCEDURE: Replacement of ascending aorta and resuspension of aortic  valve with hypothermic circulatory arrest.   History of Present Illness:    Patient is status post replacement of the ascending aorta for acute aortic dissection in 2008. She's had no significant changes over the past year with exception she notes increasing stress related to illnesses of her husband. Feels much better after new pacer placed. Has been working on  control  Of BP, Has had new onset of Afib on Xarelto .      Current Activity/ Functional Status: Patient is independent with mobility/ambulation, transfers, ADL's, IADL's.   Zubrod Score: At the time of surgery this patient's most appropriate activity status/level should be described as: []     0    Normal activity, no symptoms [x]     1    Restricted in physical strenuous activity but ambulatory, able to do out light work []     2    Ambulatory and capable of self care, unable to do work activities, up and about >50 % of waking hours                              []     3    Only limited self care, in bed greater than 50% of waking hours []     4    Completely disabled, no self care, confined to bed or chair []     5    Moribund  Past Medical History  Diagnosis Date  . Anxiety   . Hyperlipidemia   . Hypertension   . Diabetes mellitus   . GERD (gastroesophageal reflux disease)   . Cancer     skin  . Kidney stones   . Diverticulosis   . Hemorrhoids   . Acute thoracic aortic dissection 2008    emergency surgery - [name]  . Sinus  node dysfunction     hx of PPM    Past Surgical History  Procedure Laterality Date  . Pacemaker insertion  08/2006    dual chamber Medtronic EnRhythm; r/t sinus node dysfunction   . Appendectomy    . Abdominal hysterectomy  1984  . Thoracic aortic aneurysm repair  2000    type III  . Repair of acute ascending thoracic aortic dissection  2008    Dr. Servando [name]  . Transthoracic echocardiogram  09/20/2012    EF=>55% with mild conc LVH; LA mod dilated; RA mildly dilated; mild MR/TR/AR  . Pacemaker generator change  2014    Medtronic adapta    Family History  Problem Relation Age of Onset  . Heart attack Father   . Cancer Neg Hx   . Kidney disease Mother   . Heart attack Maternal Grandmother   . Valvular heart disease Son     valve replacement at 46    History   Social History  . Marital Status: Married    Spouse Name: N/A    Number  of Children: 2  . Years of Education: N/A   Occupational History  .     Social History Main Topics  . Smoking status: Former Smoker    Quit date: 09/04/1993  . Smokeless tobacco: Never Used  . Alcohol Use: No  . Drug Use: No  . Sexual Activity: Not on file   Other Topics Concern  . Not on file   Social History Narrative  . No narrative on file    History  Smoking status  . Former Smoker  . Quit date: 09/04/1993  Smokeless tobacco  . Never Used    History  Alcohol Use No     Allergies  Allergen Reactions  . Oxycodone Itching  . Vicodin [Hydrocodone-Acetaminophen] Itching  . Neomycin-Bacitracin Zn-Polymyx Other (See Comments)    Red , swollen eye  . Sulfonamide Derivatives     Just had reaction when taking during pregnancy- last child birth 49    Current Outpatient Prescriptions  Medication Sig Dispense Refill  . ALPRAZolam (XANAX) 0.5 MG tablet TABLET BY MOUTH 3 TIMES A DAY AS NEEDED FOR ANXIETY OR SLEEP  90 tablet  0  . carvedilol (COREG) 25 MG tablet Take 1 tablet (25 mg total) by mouth 2 (two) times daily.  180  tablet  3  . cholecalciferol (VITAMIN D) 1000 UNITS tablet Take 1,000 Units by mouth daily.      . citalopram (CELEXA) 40 MG tablet 1 tab by mouth daily--appointment due now  30 tablet  0  . fenofibrate 160 MG tablet Take 160 mg by mouth daily.      . furosemide (LASIX) 40 MG tablet Take 40 mg by mouth daily as needed for fluid or edema.       . hydroxypropyl methylcellulose (ISOPTO TEARS) 2.5 % ophthalmic solution Place 1 drop into both eyes 3 (three) times daily as needed (for dry eyes).      . hyoscyamine (LEVSIN SL) 0.125 MG SL tablet TAKE 1 TABLET BY MOUTH TWICE A DAY AS NEEDED FOR CRAMPING OR DIARRHEA OR LOOSE STOOLS  60 tablet  0  . lansoprazole (PREVACID) 15 MG capsule Take 15 mg by mouth as needed (for heart burn).       . Rivaroxaban (XARELTO) 20 MG TABS tablet Take 1 tablet (20 mg total) by mouth daily with supper.  15 tablet  0  . rosuvastatin (CRESTOR) 20 MG tablet 1 tab by mouth at bedtime--labs are due now  30 tablet  0  . sitaGLIPtin (JANUVIA) 100 MG tablet Take 1 tablet (100 mg total) by mouth daily. Labs are due now  30 tablet  0   No current facility-administered medications for this visit.       Review of Systems:     Cardiac Review of Systems: Y or N  Chest Pain [  n  ]  Resting SOB [n   ] Exertional SOB  Blue.Reese  ]  Orthopnea Florencio.Farrier  ]   Pedal Edema Florencio.Farrier   ]    Palpitations Florencio.Farrier  ] Syncope  Florencio.Farrier  ]   Presyncope [  n ]  General Review of Systems: [Y] = yes [  ]=no Constitional: recent weight change [n  ]; anorexia Florencio.Farrier  ]; fatigue [ y ]; nausea [ n ]; night sweats [ n ]; fever [ n ]; or chills [n  ];  Dental: poor dentition[ n ];   Eye : blurred vision [  ]; diplopia [   ]; vision changes [  ];  Amaurosis fugax[  ]; Resp: cough [  ];  wheezing[  ];  hemoptysis[  ]; shortness of breath[ n ]; paroxysmal nocturnal dyspnea[n  ]; dyspnea on exertion[ y ];  or orthopnea[  ];  GI:  gallstones[  ], vomiting[  ];  dysphagia[  ]; melena[  ];  hematochezia [  ]; heartburn[  ];   Hx of  Colonoscopy[  ]; GU: kidney stones [  ]; hematuria[  ];   dysuria [  ];  nocturia[  ];  history of     obstruction [  ];             Skin: rash, swelling[  ];, hair loss[  ];  peripheral edema[  ];  or itching[  ]; Musculosketetal: myalgias[  ];  joint swelling[  ];  joint erythema[  ];  joint pain[  ];  back pain[  ];  Heme/Lymph: bruising[  ];  bleeding[  ];  anemia[  ];  Neuro: TIA[  ];  headaches[  ];  stroke[  ];  vertigo[  ];  seizures[  ];   paresthesias[  ];  difficulty walking[  ];  Psych:depression[  ]; anxiety[  ];  Endocrine: diabetes[  ];  thyroid dysfunction[  ];  Immunizations: Flu [?  ]; Pneumococcal[ ? ];  Other:  Physical Exam: BP 148/88  Pulse 92  Resp 20  Ht 5' (1.524 m)  Wt 198 lb (89.812 kg)  BMI 38.67 kg/m2  SpO2 94% Rhythm is regular today, she does not appear on exam to be in atrial fibrillation currently General appearance: alert, cooperative and appears older than stated age Neurologic: intact Heart: regular rate and rhythm, S1, S2 normal, no murmur, click, rub or gallop and normal apical impulse Lungs: clear to auscultation bilaterally and normal percussion bilaterally Abdomen: soft, non-tender; bowel sounds normal; no masses,  no organomegaly Extremities: extremities normal, atraumatic, no cyanosis or edema and Homans sign is negative, no sign of DVT Wound: sternum stable   Diagnostic Studies & Laboratory data:     Recent Radiology Findings:  Ct Angio Chest Aorta W/cm &/or Wo/cm  01/03/2014   CLINICAL DATA:  Aortic dissection  EXAM: CT ANGIOGRAPHY CHEST WITH CONTRAST  TECHNIQUE: Multidetector CT imaging of the chest was performed using the standard protocol during bolus administration of intravenous contrast. Multiplanar CT image reconstructions including MIPs were obtained to evaluate the vascular anatomy.  CONTRAST:   OMNIPAQUE IOHEXOL 350 MG/ML SOLN  COMPARISON:  CT CHEST W/CM dated 02/09/2013  FINDINGS: Again is noted graft repair of the ascending aorta. There continues to be thrombosis of the false lumen in the ascending aorta. The left side of the thrombosed false lumen remains hyperdense. This may represent graft material. There is no evidence of filling of the false lumen in the ascending aorta. Again, these findings are stable.  Again is noted the type a dissection. The flat begins at the takeoff of the innominate artery. Innominate artery, left common carotid artery, and left subclavian artery are all widely patent with air origin from the true lumen. The false lumen does fill with contrast extending into the descending aorta. A fenestration is suspected at the takeoff of the innominate artery. See image 36 of series 4. Maximal diameter of the aortic arch is 3.4 cm which is stable.  Limited images of the upper abdomen  demonstrate a separate takeoff for the left gastric and accessory left hepatic artery. Celiac axis is patent. Branch vessels are patent. Origin of the SMA is grossly patent with atherosclerotic changes.  Right thyroid nodule is stable.  No abnormal mediastinal adenopathy. Small precarinal node with a fatty hilum.  No pneumothorax.  No pleural effusion.  Minimal basilar scarring versus subsegmental atelectasis. Dual lead left subclavian pacemaker device and leads are stable and intact. No acute bony deformity.  Stable right adrenal nodule. Left renal cyst. Left lower pole renal calculus.  Review of the MIP images confirms the above findings.  IMPRESSION: Stable appearance of the type a aortic dissection status post graft repair of the ascending aorta. Great vessels remain patent. Aortic arch diameter is stable at 3.4 cm.  Stable chronic findings described above.   Electronically Signed   By: Maryclare Bean M.D.   On: 01/03/2014 11:47    CT march 2014 RADIOLOGY REPORT*  Clinical Data: History of aortic  dissection.  CT CHEST, ABDOMEN AND PELVIS WITH CONTRAST  Technique: Multidetector CT imaging of the chest, abdomen and  pelvis was performed following the standard protocol during bolus  administration of intravenous contrast.  Contrast: 174mL OMNIPAQUE IOHEXOL 300 MG/ML SOLN  Comparison: CT of the chest, abdomen and pelvis 06/18/2011.  CT CHEST  Findings:  Mediastinum: Postoperative changes of median sternotomy are noted,  likely from prior graft repair of the ascending thoracic aorta  (presumably from prior type A aortic dissection). Again noted is a  dissection which begins after the distal end of the ascending  thoracic aorta graft in the proximal aortic arch. The dissection  flap does not appear to extend into the great vessels. The  dissection does extend inferiorly to the level of the pelvis (see  below). The aortic arch is mildly dilated (3.2 cm in diameter)  which is unchanged, and the descending thoracic aorta is also  mildly dilated up to 3.4 cm in diameter at the level of the  pulmonary arteries (also unchanged). Contrast fills both the true  and false lumens. No abnormal high attenuation material is seen  adjacent to the thoracic aorta to suggest active hemorrhage at this  time.  Heart size is mildly enlarged. There is no significant pericardial  fluid, thickening or pericardial calcification. Calcifications of  the aortic valve. Left-sided pacemaker with lead terminating in  the right atrium and right ventricular apex. No pathologically  enlarged mediastinal or hilar lymph nodes. Esophagus is  unremarkable appearance.  Lungs/Pleura: No acute consolidative airspace disease. No pleural  effusions. There are some linear opacities scattered throughout  the periphery of the lung bases bilaterally which are unchanged and  compatible with areas of mild chronic pleural parenchymal scarring.  No suspicious appearing pulmonary nodules or masses are identified  at this time.    Musculoskeletal: Sternotomy wires. There are no aggressive  appearing lytic or blastic lesions noted in the visualized portions  of the skeleton.  IMPRESSION:  1. Postoperative changes of graft repair of the ascending thoracic  aorta for prior type A dissection. The residual  dissection/aneurysm in the arch and descending thoracic aorta is  essentially unchanged in appearance compared to the prior  examination, as detailed above.  2. Mild cardiomegaly.  3. Additional incidental findings, as above.  CT ABDOMEN AND PELVIS  Findings:  Abdomen/Pelvis: The previously described dissection flap extends  into the pelvis where appears to terminate in the distal left  common iliac artery. All of the abdominal and pelvic  vessels  appear to be perfusing well at this time, with the true lumen  giving rise to the celiac axis, superior mesenteric artery, right  renal artery and the inferior mesenteric artery, and the false  lumen giving rise to the left renal artery. The dissection flap  terminates in the distal left common iliac artery, with the true  lumen giving rise to the left internal iliac artery and the false  lumen giving rise to left external iliac artery. The dissection  flap does not extend into the right iliac vasculature. Maximal  abdominal aortic diameter is 2.5 x 2.0 cm at a level just below the  renal arteries.  The appearance of the liver, gallbladder, pancreas, spleen and left  adrenal gland is unremarkable. Low attenuation right adrenal  nodule is unchanged in size measuring 17 mm in diameter and has  been previously characterized as an adenoma. Low attenuation  lesions in the kidneys bilaterally are unchanged and compatible  with simple cysts, with the largest lesion in the lower pole of the  right kidney measuring approximately 3.3 cm in diameter.  No significant volume of ascites. No pneumoperitoneum. No  pathologic distension of small bowel. There are numerous colonic   diverticula, particularly of the descending colon and sigmoid  colon, without surrounding inflammatory changes to suggest acute  diverticulitis at this time. Status post hysterectomy. Ovaries  are unremarkable in appearance. Urinary bladder is normal in  appearance. No definite pathologic lymphadenopathy identified  within the abdomen or pelvis.  Musculoskeletal: There are no aggressive appearing lytic or blastic  lesions noted in the visualized portions of the skeleton.  IMPRESSION:  1. No change in the extent of the continuation of the dissection  from the thoracic aorta into the abdomen and pelvis, as detailed  above.  2. 1.7 cm right adrenal adenoma is unchanged.  3. Mild colonic diverticulosis without evidence to suggest acute  diverticulitis at this time.  4. Additional incidental findings, as above, similar to prior  studies.  Original Report Authenticated By: Vinnie Langton, M.D.     Recent Lab Findings: Lab Results  Component Value Date   WBC 6.8 08/22/2013   HGB 14.2 08/22/2013   HCT 41.7 08/22/2013   PLT 228 08/22/2013   GLUCOSE 82 08/22/2013   CHOL 146 05/04/2013   TRIG 174.0* 05/04/2013   HDL 46.70 05/04/2013   LDLDIRECT 217.7 09/13/2011   LDLCALC 65 05/04/2013   ALT 13 08/22/2013   AST 17 08/22/2013   NA 142 08/22/2013   K 4.6 08/22/2013   CL 103 08/22/2013   CREATININE 0.74 01/01/2014   BUN 17 01/01/2014   CO2 29 08/22/2013   TSH 0.986 08/22/2013   INR 0.97 08/22/2013   HGBA1C 6.7* 05/04/2013      Assessment / Plan:      Patient seen in followup after aortic dissection acute repair done 09/02/2007, followup CT scan done  Followup CTA of the chest shows persistent false lumen after repair of acute aortic dissection and replacement of ascending aorta and resuspension of aortic valve, followup scan shows no progressive enlargement of the aorta especially in the arch. The patient notes that she's been diligent about control of her blood pressure, and recently had her blood  pressure medications increased Plan to see her back in 24  months with followup CTA scan of the chest.      Grace Isaac MD  Beeper [phone] Office [phone] 01/03/2014 1:21 PM

## 2014-01-13 ENCOUNTER — Other Ambulatory Visit: Payer: Self-pay | Admitting: Family Medicine

## 2014-01-14 MED ORDER — SITAGLIPTIN PHOSPHATE 100 MG PO TABS: 100.0000 mg | ORAL_TABLET | Freq: Every day | ORAL | Status: DC

## 2014-01-14 MED ORDER — ROSUVASTATIN CALCIUM 20 MG PO TABS: ORAL_TABLET | ORAL | Status: DC

## 2014-01-14 NOTE — Telephone Encounter (Signed)
Pt called stating that she needs her xanax. Pt did make an appt for Monday 01/14/14 for a follow up. 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 Please advise.

## 2014-01-21 ENCOUNTER — Encounter: Payer: Self-pay | Admitting: Family Medicine

## 2014-01-21 ENCOUNTER — Ambulatory Visit (INDEPENDENT_AMBULATORY_CARE_PROVIDER_SITE_OTHER): Payer: Medicare Other | Admitting: Family Medicine

## 2014-01-21 VITALS — BP 116/72 | HR 86 | Temp 98.3°F | Wt 214.0 lb

## 2014-01-21 DIAGNOSIS — F411 Generalized anxiety disorder: Secondary | ICD-10-CM

## 2014-01-21 DIAGNOSIS — I10 Essential (primary) hypertension: Secondary | ICD-10-CM

## 2014-01-21 DIAGNOSIS — E1159 Type 2 diabetes mellitus with other circulatory complications: Secondary | ICD-10-CM

## 2014-01-21 DIAGNOSIS — E785 Hyperlipidemia, unspecified: Secondary | ICD-10-CM | POA: Diagnosis not present

## 2014-01-21 LAB — MICROALBUMIN / CREATININE URINE RATIO
CREATININE, U: 127.8 mg/dL
MICROALB UR: 1.6 mg/dL (ref 0.0–1.9)
Microalb Creat Ratio: 1.3 mg/g (ref 0.0–30.0)

## 2014-01-21 LAB — HEMOGLOBIN A1C: Hgb A1c MFr Bld: 7 % — ABNORMAL HIGH (ref 4.6–6.5)

## 2014-01-21 LAB — LIPID PANEL
CHOL/HDL RATIO: 3
Cholesterol: 160 mg/dL (ref 0–200)
HDL: 62.9 mg/dL (ref >39.00)
LDL CALC: 78 mg/dL (ref 0–99)
Triglycerides: 95 mg/dL (ref 0.0–149.0)
VLDL: 19 mg/dL (ref 0.0–40.0)

## 2014-01-21 LAB — POCT URINALYSIS DIPSTICK
Bilirubin, UA: NEGATIVE
Glucose, UA: NEGATIVE
Ketones, UA: NEGATIVE
Leukocytes, UA: NEGATIVE
Nitrite, UA: NEGATIVE
PROTEIN UA: NEGATIVE
RBC UA: NEGATIVE
SPEC GRAV UA: 1.01
UROBILINOGEN UA: 0.2
pH, UA: 6.5

## 2014-01-21 LAB — BASIC METABOLIC PANEL
BUN: 21 mg/dL (ref 6–23)
CHLORIDE: 102 meq/L (ref 96–112)
CO2: 28 mEq/L (ref 19–32)
Calcium: 9.7 mg/dL (ref 8.4–10.5)
Creatinine, Ser: 0.9 mg/dL (ref 0.4–1.2)
GFR: 63.07 mL/min (ref >60.00)
Glucose, Bld: 126 mg/dL — ABNORMAL HIGH (ref 70–99)
POTASSIUM: 4 meq/L (ref 3.5–5.1)
SODIUM: 140 meq/L (ref 135–145)

## 2014-01-21 LAB — HEPATIC FUNCTION PANEL
ALT: 20 U/L (ref 0–35)
AST: 23 U/L (ref 0–37)
Albumin: 4.3 g/dL (ref 3.5–5.2)
Alkaline Phosphatase: 45 U/L (ref 39–117)
BILIRUBIN DIRECT: 0.1 mg/dL (ref 0.0–0.3)
BILIRUBIN TOTAL: 0.5 mg/dL (ref 0.3–1.2)
Total Protein: 7.8 g/dL (ref 6.0–8.3)

## 2014-01-21 MED ORDER — SITAGLIPTIN PHOSPHATE 100 MG PO TABS: 100.0000 mg | ORAL_TABLET | Freq: Every day | ORAL | Status: DC

## 2014-01-21 MED ORDER — CITALOPRAM HYDROBROMIDE 40 MG PO TABS: ORAL_TABLET | ORAL | Status: DC

## 2014-01-21 NOTE — Progress Notes (Signed)
Patient ID: [name], female   DOB: 1945/12/28, 68 y.o.   MRN: [id number]   Subjective:    Patient ID: [name], female    DOB: 1946-10-18, 68 y.o.   MRN: [id number] HPI Pt here to f/u anxiety and diabetes, cholesterol. Htn.   HPI HYPERTENSION  Blood pressure range-good  Chest pain- no      Dyspnea- no Lightheadedness- no   Edema- no Other side effects - no   Medication compliance: good Low salt diet- yes  DIABETES  Blood Sugar ranges-102-140  Polyuria- no New Visual problems- no Hypoglycemic symptoms- no Other side effects-no Medication compliance - no Last eye exam- 2014 Foot exam- today  HYPERLIPIDEMIA  Medication compliance- good RUQ pain- no  Muscle aches- no Other side effects-no  ROS See HPI above   PMH Smoking Status noted  Past Medical History  Diagnosis Date  . Anxiety   . Hyperlipidemia   . Hypertension   . Diabetes mellitus   . GERD (gastroesophageal reflux disease)   . Cancer     skin  . Kidney stones   . Diverticulosis   . Hemorrhoids   . Acute thoracic aortic dissection 2008    emergency surgery - Gerhardt  . Sinus node dysfunction     hx of PPM   Current Outpatient Prescriptions on File Prior to Visit  Medication Sig Dispense Refill  . ALPRAZolam (XANAX) 0.5 MG tablet TAKE 1 TABLET 3 TIMES A DAY AS NEEDED FOR ANXIETY OR SLEEP  90 tablet  0  . carvedilol (COREG) 25 MG tablet Take 1 tablet (25 mg total) by mouth 2 (two) times daily.  180 tablet  3  . cholecalciferol (VITAMIN D) 1000 UNITS tablet Take 1,000 Units by mouth daily.      . citalopram (CELEXA) 40 MG tablet 1 tab by mouth daily--appointment due now  30 tablet  0  . fenofibrate 160 MG tablet Take 160 mg by mouth daily.      . furosemide (LASIX) 40 MG tablet Take 40 mg by mouth daily as needed for fluid or edema.       . hydroxypropyl methylcellulose (ISOPTO TEARS) 2.5 % ophthalmic solution Place 1 drop into both eyes 3 (three) times daily as needed (for dry eyes).        . hyoscyamine (LEVSIN SL) 0.125 MG SL tablet TAKE 1 TABLET BY MOUTH TWICE A DAY AS NEEDED FOR CRAMPING OR DIARRHEA OR LOOSE STOOLS  60 tablet  0  . lansoprazole (PREVACID) 15 MG capsule Take 15 mg by mouth as needed (for heart burn).       . Rivaroxaban (XARELTO) 20 MG TABS tablet Take 1 tablet (20 mg total) by mouth daily with supper.  15 tablet  0  . rosuvastatin (CRESTOR) 20 MG tablet 1 tab by mouth at bedtime--labs are due now  30 tablet  0  . sitaGLIPtin (JANUVIA) 100 MG tablet Take 1 tablet (100 mg total) by mouth daily. Labs are due now  30 tablet  0   No current facility-administered medications on file prior to visit.   History   Social History  . Marital Status: Married    Spouse Name: N/A    Number of Children: 2  . Years of Education: N/A   Occupational History  .     Social History Main Topics  . Smoking status: Former Smoker    Quit date: 09/04/1993  . Smokeless tobacco: Never Used  . Alcohol Use: No  . Drug  Use: No  . Sexual Activity: Not on file   Other Topics Concern  . Not on file   Social History Narrative  . No narrative on file               Objective:     BP 116/72  Pulse 86  Temp(Src) 98.3 F (36.8 C) (Oral)  Wt 214 lb (97.07 kg)  SpO2 97% General appearance: alert, cooperative, appears stated age and no distress Throat: lips, mucosa, and tongue normal; teeth and gums normal Neck: no adenopathy, supple, symmetrical, trachea midline and thyroid not enlarged, symmetric, no tenderness/mass/nodules Lungs: clear to auscultation bilaterally Heart: S1, S2 normal--- + murmur Extremities: extremities normal, atraumatic, no cyanosis or edema Sensory exam of the foot is normal, tested with the monofilament. Good pulses, no lesions or ulcers, good peripheral pulses.        Assessment & Plan:  1. Type II or unspecified type diabetes mellitus with peripheral circulatory disorders, uncontrolled(250.72) Check labs and con't meds - Hemoglobin  A1c - Microalbumin / creatinine urine ratio  2. HTN (hypertension) Stable , con't meds - Basic metabolic panel - POCT urinalysis dipstick  3. Other and unspecified hyperlipidemia Check labs, con't meds - Hepatic function panel - Lipid panel

## 2014-01-21 NOTE — Progress Notes (Signed)
Pre visit review using our clinic review tool, if applicable. No additional management support is needed unless otherwise documented below in the visit note. 

## 2014-01-21 NOTE — Assessment & Plan Note (Signed)
con't meds stable 

## 2014-01-21 NOTE — Patient Instructions (Signed)

## 2014-01-22 ENCOUNTER — Other Ambulatory Visit: Payer: Self-pay

## 2014-01-22 DIAGNOSIS — E119 Type 2 diabetes mellitus without complications: Secondary | ICD-10-CM

## 2014-02-08 ENCOUNTER — Ambulatory Visit (INDEPENDENT_AMBULATORY_CARE_PROVIDER_SITE_OTHER): Payer: Medicare Other | Admitting: Internal Medicine

## 2014-02-08 ENCOUNTER — Encounter: Payer: Self-pay | Admitting: Internal Medicine

## 2014-02-08 VITALS — BP 142/82 | HR 92 | Temp 97.5°F | Resp 12 | Ht 60.0 in | Wt 216.0 lb

## 2014-02-08 DIAGNOSIS — E119 Type 2 diabetes mellitus without complications: Secondary | ICD-10-CM | POA: Diagnosis not present

## 2014-02-08 NOTE — Progress Notes (Signed)
Patient ID: [name], female   DOB: February 02, 1946, 68 y.o.   MRN: [id number]  HPI: [name] is a 68 y.o.-year-old female, referred by her PCP, Dr. Etter Sjogren for management of DM2, non-insulin-dependent, uncontrolled, with complications (h/o Ao dissection).  She is very stressed: husband stroke survivor - paralyzed on R side and many comorbidities >> she is the caregiver. She is sleeping very lightly because of this, did not have a 4h stretch of continues sleep in months. She feels irritable b/c of this.    Patient has been diagnosed with diabetes in 08/2006; she has not been on insulin before. Last hemoglobin A1c was: Lab Results  Component Value Date   HGBA1C 7.0* 01/21/2014   HGBA1C 6.7* 05/04/2013   HGBA1C 6.3 01/09/2013   Pt is on a regimen of: - Januvia 100 mg She tried Metformin at Dx >> diarrhea   Pt checks her sugars  2x a day and they are lower now than at the time of the HbA1c draw: - am: 102-140 after a good night, 150 after a "bad" night - 2h after b'fast: n/c - before lunch: n/c - 2h after lunch: n/c - before dinner: 150-158 - 2h after dinner: n/c - bedtime: n/c - nighttime: n/c  No lows. Lowest sugar was 102;? if she has hypoglycemia awareness Highest sugar was 158.  Pt's meals are: - Breakfast: eggs/cereals/toast/bacon - Lunch: sandwich (wheat)/ cottage cheese + oregano/ salads - Dinner: veggies/meat/cottage cheese/salads - Snacks: PB crackers or cottage cheese She is walking daily.  - no CKD, last BUN/creatinine:  Lab Results  Component Value Date   BUN 21 01/21/2014   CREATININE 0.9 01/21/2014  NO ACEI.  - last set of lipids: Lab Results  Component Value Date   CHOL 160 01/21/2014   HDL 62.90 01/21/2014   LDLCALC 78 01/21/2014   LDLDIRECT 217.7 09/13/2011   TRIG 95.0 01/21/2014   CHOLHDL 3 01/21/2014  On Crestor.  - last eye exam was in 03/2013. No DR.  - no numbness and tingling in her feet.  I reviewed her chart and she also has a history of 2x  thoracic Ao aneurysm. She has a pacemaker >> stopped working >> started on a new one >> developed A fib.   No FH of DM.  ROS: Constitutional: no weight gain/loss, + fatigue, + hot flushes, + poor sleep Eyes: no blurry vision, no xerophthalmia ENT: no sore throat, no nodules palpated in throat, no dysphagia/odynophagia, no hoarseness Cardiovascular: no CP/SOB/+ palpitations/+ leg swelling Respiratory: no cough/SOB Gastrointestinal: no N/V/D/C Musculoskeletal: no muscle/joint aches Skin: no rashes, + hair loss Neurological: no tremors/numbness/tingling/dizziness Psychiatric: no depression/anxiety + low libido  Past Medical History  Diagnosis Date  . Anxiety   . Hyperlipidemia   . Hypertension   . Diabetes mellitus   . GERD (gastroesophageal reflux disease)   . Cancer     skin  . Kidney stones   . Diverticulosis   . Hemorrhoids   . Acute thoracic aortic dissection 2008    emergency surgery - Gerhardt  . Sinus node dysfunction     hx of PPM   Past Surgical History  Procedure Laterality Date  . Pacemaker insertion  08/2006    dual chamber Medtronic EnRhythm; r/t sinus node dysfunction   . Appendectomy    . Abdominal hysterectomy  1984  . Thoracic aortic aneurysm repair  2000    type III  . Repair of acute ascending thoracic aortic dissection  2008    Dr.  Gerhardt  . Transthoracic echocardiogram  09/20/2012    EF=>55% with mild conc LVH; LA mod dilated; RA mildly dilated; mild MR/TR/AR  . Pacemaker generator change  2014    Medtronic adapta  . Eye surgery  2014    Rght eye   History   Social History  . Marital Status: Married    Spouse Name: N/A    Number of Children: 2   Occupational History  . Retired - former Astronomer    Social History Main Topics  . Smoking status: Former Smoker    Quit date: 09/04/1993  . Smokeless tobacco: Never Used  . Alcohol Use: No  . Drug Use: No   Current Outpatient Prescriptions on File Prior to Visit  Medication Sig  Dispense Refill  . ALPRAZolam (XANAX) 0.5 MG tablet TAKE 1 TABLET 3 TIMES A DAY AS NEEDED FOR ANXIETY OR SLEEP  90 tablet  0  . carvedilol (COREG) 25 MG tablet Take 1 tablet (25 mg total) by mouth 2 (two) times daily.  180 tablet  3  . cholecalciferol (VITAMIN D) 1000 UNITS tablet Take 1,000 Units by mouth daily.      . citalopram (CELEXA) 40 MG tablet 1 tab by mouth daily--appointment due now  90 tablet  3  . fenofibrate 160 MG tablet Take 160 mg by mouth daily.      . furosemide (LASIX) 40 MG tablet Take 40 mg by mouth daily as needed for fluid or edema.       . hydroxypropyl methylcellulose (ISOPTO TEARS) 2.5 % ophthalmic solution Place 1 drop into both eyes 3 (three) times daily as needed (for dry eyes).      . hyoscyamine (LEVSIN SL) 0.125 MG SL tablet TAKE 1 TABLET BY MOUTH TWICE A DAY AS NEEDED FOR CRAMPING OR DIARRHEA OR LOOSE STOOLS  60 tablet  0  . lansoprazole (PREVACID) 15 MG capsule Take 15 mg by mouth as needed (for heart burn).       . Rivaroxaban (XARELTO) 20 MG TABS tablet Take 1 tablet (20 mg total) by mouth daily with supper.  15 tablet  0  . rosuvastatin (CRESTOR) 20 MG tablet 1 tab by mouth at bedtime--labs are due now  30 tablet  0  . sitaGLIPtin (JANUVIA) 100 MG tablet Take 1 tablet (100 mg total) by mouth daily. Labs are due now  30 tablet  5   No current facility-administered medications on file prior to visit.   Allergies  Allergen Reactions  . Oxycodone Itching  . Vicodin [Hydrocodone-Acetaminophen] Itching  . Neomycin-Bacitracin Zn-Polymyx Other (See Comments)    Red , swollen eye  . Sulfonamide Derivatives     Just had reaction when taking during pregnancy- last child birth 73   Family History  Problem Relation Age of Onset  . Heart attack Father   . Cancer Neg Hx   . Kidney disease Mother   . Heart attack Maternal Grandmother   . Valvular heart disease Son     valve replacement at 80   PE: BP 142/82  Pulse 92  Temp(Src) 97.5 F (36.4 C) (Oral)   Resp 12  Ht 5' (1.524 m)  Wt 216 lb (97.977 kg)  BMI 42.18 kg/m2  SpO2 95% Wt Readings from Last 3 Encounters:  02/08/14 216 lb (97.977 kg)  01/21/14 214 lb (97.07 kg)  01/03/14 198 lb (89.812 kg)   Constitutional: overweight, in NAD Eyes: PERRLA, EOMI, no exophthalmos ENT: moist mucous membranes, no thyromegaly, no cervical lymphadenopathy  Cardiovascular: RRR, No MRG Respiratory: CTA B Gastrointestinal: abdomen soft, NT, ND, BS+ Musculoskeletal: no deformities, strength intact in all 4 Skin: moist, warm, no rashes Neurological: no tremor with outstretched hands, DTR normal in all 4  ASSESSMENT: 1. DM2, non-insulin-dependent, uncontrolled, with complications - h/o aortic dissection  PLAN:  1. Patient with long-standing, recently more uncontrolled diabetes (but still at goal HbA1c 7.0%), on oral antidiabetic regimen with Januvia. Pt started to improve her diet after her last A1c was drawn. - We discussed about options for treatment, and I suggested to:  Patient Instructions  Please continue Januvia 100 mg in am. Start writing your sugars down - check daily, rotating check times. Please let me know if sugars are consistently <80 or >180. Continue to watch your diet.  - At next visit, we can try Metformin XR/Glumetza or Cycloset.  - Strongly advised her to start checking sugars at different times of the day - check once a day, rotating checks - given sugar log and advised how to fill it and to bring it at next appt  - given foot care handout and explained the principles  - given instructions for hypoglycemia management "15-15 rule"  - advised for yearly eye exams - she is up to date - Return to clinic in 3 mo with sugar log

## 2014-02-08 NOTE — Patient Instructions (Signed)
Please continue Januvia 100 mg in am. Start writing your sugars down - check daily, rotating check times. Please let me know if sugars are consistently <80 or >180. Continue to watch your diet.   Please consider the following ways to cut down carbs and fat and increase fiber and micronutrients in your diet:  - substitute whole grain for white bread or pasta - substitute brown rice for white rice - substitute 90-calorie flat bread pieces for slices of bread when possible - substitute sweet potatoes or yams for white potatoes - substitute humus for margarine - substitute tofu for cheese when possible - substitute almond or rice milk for regular milk (would not drink soy milk daily due to concern for soy estrogen influence on breast cancer risk) - substitute dark chocolate for other sweets when possible - substitute water - can add lemon or orange slices for taste - for diet sodas (artificial sweeteners will trick your body that you can eat sweets without getting calories and will lead you to overeating and weight gain in the long run) - do not skip breakfast or other meals (this will slow down the metabolism and will result in more weight gain over time)  - can try smoothies made from fruit and almond/rice milk in am instead of regular breakfast - can also try old-fashioned (not instant) oatmeal made with almond/rice milk in am - order the dressing on the side when eating salad at a restaurant (pour less than half of the dressing on the salad) - eat as little meat as possible - can try juicing, but should not forget that juicing will get rid of the fiber, so would alternate with eating raw veg./fruits or drinking smoothies - use as little oil as possible, even when using olive oil - can dress a salad with a mix of balsamic vinegar and lemon juice, for e.g. - use agave nectar, stevia sugar, or regular sugar rather than artificial sweateners - steam or broil/roast veggies  - snack on  veggies/fruit/nuts (unsalted, preferably) when possible, rather than processed foods - reduce or eliminate aspartame in diet (it is in diet sodas, chewing gum, etc) Read the labels!  Try to read Dr. Janene Harvey book: "Program for Reversing Diabetes" for the vegan concept and other ideas for healthy eating.  PATIENT INSTRUCTIONS FOR TYPE 2 DIABETES:  DIET AND EXERCISE Diet and exercise is an important part of diabetic treatment.  We recommended aerobic exercise in the form of brisk walking (working between 40-60% of maximal aerobic capacity, similar to brisk walking) for 150 minutes per week (such as 30 minutes five days per week) along with 3 times per week performing 'resistance' training (using various gauge rubber tubes with handles) 5-10 exercises involving the major muscle groups (upper body, lower body and core) performing 10-15 repetitions (or near fatigue) each exercise. Start at half the above goal but build slowly to reach the above goals. If limited by weight, joint pain, or disability, we recommend daily walking in a swimming pool with water up to waist to reduce pressure from joints while allow for adequate exercise.    BLOOD GLUCOSES Monitoring your blood glucoses is important for continued management of your diabetes. Please check your blood glucoses 1-4 times a day: fasting, before meals and at bedtime (you can rotate these measurements - e.g. one day check before the 3 meals, the next day check before 2 of the meals and before bedtime, etc.   HYPOGLYCEMIA (low blood sugar) Hypoglycemia is usually a  reaction to not eating, exercising, or taking too much insulin/ other diabetes drugs.  Symptoms include tremors, sweating, hunger, confusion, headache, etc. Treat IMMEDIATELY with 15 grams of Carbs:   4 glucose tablets    cup regular juice/soda   2 tablespoons raisins   4 teaspoons sugar   1 tablespoon honey Recheck blood glucose in 15 mins and repeat above if still  symptomatic/blood glucose <100. Please contact our office at [phone] if you have questions about how to next handle your insulin.  RECOMMENDATIONS TO REDUCE YOUR RISK OF DIABETIC COMPLICATIONS: * Take your prescribed MEDICATION(S). * Follow a DIABETIC diet: Complex carbs, fiber rich foods, heart healthy fish twice weekly, (monounsaturated and polyunsaturated) fats * AVOID saturated/trans fats, high fat foods, >2,300 mg salt per day. * EXERCISE at least 5 times a week for 30 minutes or preferably daily.  * DO NOT SMOKE OR DRINK more than 1 drink a day. * Check your FEET every day. Do not wear tightfitting shoes. Contact us if you develop an ulcer * See your EYE doctor once a year or more if needed * Get a FLU shot once a year * Get a PNEUMONIA vaccine once before and once after age 1 years  GOALS:  * Your Hemoglobin A1c of <7%  * fasting sugars need to be <130 * after meals sugars need to be <180 (2h after you start eating) * Your Systolic BP should be 157 or lower  * Your Diastolic BP should be 80 or lower  * Your HDL (Good Cholesterol) should be 40 or higher  * Your LDL (Bad Cholesterol) should be 100 or lower  * Your Triglycerides should be 150 or lower  * Your Urine microalbumin (kidney function) should be <30 * Your Body Mass Index should be 25 or lower   We will be glad to help you achieve these goals. Our telephone number is: [phone].

## 2014-02-15 ENCOUNTER — Telehealth: Payer: Self-pay | Admitting: Cardiovascular Disease

## 2014-02-15 NOTE — Telephone Encounter (Signed)
Pt is going out of town and wants to know about is pacemaker check. She stated that she is going out of town and needs to know something about this. She wants to know if she can push it back or if she needs to take the remote with her to have [place] the [place]. She stated that she left you a message and would like a call back today, they are leaving in the AM.  [facility]

## 2014-02-15 NOTE — Telephone Encounter (Signed)
[facility] date R/S to 4-8. Patient aware.

## 2014-02-20 ENCOUNTER — Encounter: Payer: Medicare Other | Admitting: *Deleted

## 2014-02-25 ENCOUNTER — Other Ambulatory Visit: Payer: Self-pay | Admitting: Family Medicine

## 2014-02-26 NOTE — Telephone Encounter (Signed)
Last seen 01/21/14 and filled 12/07/13 #90 UDS 02/21/13 low risk, however negative for Xanax  Please advise      KP

## 2014-03-06 ENCOUNTER — Ambulatory Visit (INDEPENDENT_AMBULATORY_CARE_PROVIDER_SITE_OTHER): Payer: Medicare Other | Admitting: *Deleted

## 2014-03-06 DIAGNOSIS — Z950 Presence of cardiac pacemaker: Secondary | ICD-10-CM

## 2014-03-06 DIAGNOSIS — I495 Sick sinus syndrome: Secondary | ICD-10-CM | POA: Diagnosis not present

## 2014-03-12 LAB — MDC_IDC_ENUM_SESS_TYPE_REMOTE
Battery Impedance: 110 Ohm
Battery Remaining Longevity: 151 mo
Brady Statistic AP VP Percent: 2 %
Brady Statistic AS VP Percent: 0 %
Date Time Interrogation Session: 20150408124003
Lead Channel Impedance Value: 420 Ohm
Lead Channel Pacing Threshold Amplitude: 0.5 V
Lead Channel Pacing Threshold Amplitude: 0.5 V
Lead Channel Pacing Threshold Pulse Width: 0.4 ms
Lead Channel Sensing Intrinsic Amplitude: 11.2 mV
Lead Channel Setting Pacing Amplitude: 1.5 V
Lead Channel Setting Pacing Amplitude: 2 V
Lead Channel Setting Sensing Sensitivity: 5.6 mV
MDC IDC MSMT BATTERY VOLTAGE: 2.8 V
MDC IDC MSMT LEADCHNL RV IMPEDANCE VALUE: 749 Ohm
MDC IDC MSMT LEADCHNL RV PACING THRESHOLD PULSEWIDTH: 0.4 ms
MDC IDC SET LEADCHNL RV PACING PULSEWIDTH: 0.4 ms
MDC IDC STAT BRADY AP VS PERCENT: 98 %
MDC IDC STAT BRADY AS VS PERCENT: 0 %

## 2014-03-22 ENCOUNTER — Encounter: Payer: Self-pay | Admitting: *Deleted

## 2014-03-27 ENCOUNTER — Encounter: Payer: Self-pay | Admitting: Cardiovascular Disease

## 2014-04-05 ENCOUNTER — Other Ambulatory Visit: Payer: Self-pay | Admitting: Family Medicine

## 2014-04-09 ENCOUNTER — Other Ambulatory Visit: Payer: Self-pay | Admitting: Family Medicine

## 2014-04-10 NOTE — Telephone Encounter (Signed)
Med filled.  

## 2014-05-10 ENCOUNTER — Encounter: Payer: Self-pay | Admitting: Internal Medicine

## 2014-05-10 ENCOUNTER — Other Ambulatory Visit: Payer: Self-pay | Admitting: Cardiovascular Disease

## 2014-05-10 ENCOUNTER — Ambulatory Visit (INDEPENDENT_AMBULATORY_CARE_PROVIDER_SITE_OTHER): Payer: Medicare Other | Admitting: Internal Medicine

## 2014-05-10 VITALS — BP 118/76 | HR 89 | Temp 98.5°F | Resp 12 | Wt 216.0 lb

## 2014-05-10 DIAGNOSIS — E1159 Type 2 diabetes mellitus with other circulatory complications: Secondary | ICD-10-CM | POA: Diagnosis not present

## 2014-05-10 DIAGNOSIS — E119 Type 2 diabetes mellitus without complications: Secondary | ICD-10-CM | POA: Diagnosis not present

## 2014-05-10 LAB — HEMOGLOBIN A1C: Hgb A1c MFr Bld: 6.9 % — ABNORMAL HIGH (ref 4.6–6.5)

## 2014-05-10 MED ORDER — SITAGLIPTIN PHOSPHATE 100 MG PO TABS: 100.0000 mg | ORAL_TABLET | Freq: Every day | ORAL | Status: DC

## 2014-05-10 NOTE — Telephone Encounter (Signed)
Rx refill sent to patient pharmacy   

## 2014-05-10 NOTE — Patient Instructions (Signed)
Please check with your insurance if they cover: - Tradjenta - Onglyza - Nesina Continue Januvia 100 mg daily in am.  Please stop by the lab.

## 2014-05-10 NOTE — Progress Notes (Signed)
Patient ID: [name], female   DOB: June 10, 1946, 68 y.o.   MRN: [id number]  HPI: [name] is a 68 y.o.-year-old female, returning for f/u for DM2, dx 08/2006, non-insulin-dependent, uncontrolled, with complications (h/o Ao dissection).  She is very stressed: husband stroke survivor - paralyzed on L side and many comorbidities. He had a knee replacement, but now at home.  Last hemoglobin A1c was: Lab Results  Component Value Date   HGBA1C 7.0* 01/21/2014   HGBA1C 6.7* 05/04/2013   HGBA1C 6.3 01/09/2013   Pt is on a regimen of: - Januvia 100 mg She tried Metformin at Dx >> diarrhea   Pt checks her sugars  2x a day: - am: 102-140 after a good night, 150 after a "bad" night >> 100-140 - 2h after b'fast: n/c - before lunch: n/c - 2h after lunch: n/c - before dinner: 150-158 >> 110-168 - 2h after dinner: n/c - bedtime: n/c - nighttime: n/c No lows. Lowest sugar was 100;? if she has hypoglycemia awareness Highest sugar was 168 x 1.  She exercises more. She cut out the majority of her sweets.  Pt's meals are: - Breakfast: eggs/cereals/toast/bacon - Lunch: sandwich (wheat)/ cottage cheese + oregano/ salads - Dinner: veggies/meat/cottage cheese/salads - Snacks: PB crackers or cottage cheese She is walking daily.  - no CKD, last BUN/creatinine:  Lab Results  Component Value Date   BUN 21 01/21/2014   CREATININE 0.9 01/21/2014  NO ACEI.  - last set of lipids: Lab Results  Component Value Date   CHOL 160 01/21/2014   HDL 62.90 01/21/2014   LDLCALC 78 01/21/2014   LDLDIRECT 217.7 09/13/2011   TRIG 95.0 01/21/2014   CHOLHDL 3 01/21/2014  On Crestor.  - last eye exam was in 03/2013. No DR.  - no numbness and tingling in her feet.  I reviewed her chart and she also has a history of 2x thoracic Ao aneurysm. She has a pacemaker.  I reviewed pt's medications, allergies, PMH, social hx, family hx and no changes required, except as mentioned above.  ROS: Constitutional: no  weight gain/loss, + fatigue, + hot flushes, + poor sleep Eyes: no blurry vision, no xerophthalmia ENT: no sore throat, no nodules palpated in throat, no dysphagia/odynophagia, no hoarseness Cardiovascular: no CP/SOB/+ palpitations/+ leg swelling Respiratory: no cough/SOB Gastrointestinal: no N/V/D/C Musculoskeletal: no muscle/joint aches Skin: no rashes, + hair loss Neurological: no tremors/numbness/tingling/dizziness  PE: BP 118/76  Pulse 89  Temp(Src) 98.5 F (36.9 C) (Oral)  Resp 12  Wt 216 lb (97.977 kg)  SpO2 95% Wt Readings from Last 3 Encounters:  05/10/14 216 lb (97.977 kg)  02/08/14 216 lb (97.977 kg)  01/21/14 214 lb (97.07 kg)   Constitutional: overweight, in NAD Eyes: PERRLA, EOMI, no exophthalmos ENT: moist mucous membranes, no thyromegaly, no cervical lymphadenopathy Cardiovascular: RRR, No MRG Respiratory: CTA B Gastrointestinal: abdomen soft, NT, ND, BS+ Musculoskeletal: no deformities, strength intact in all 4 Skin: moist, warm, no rashes Neurological: no tremor with outstretched hands, DTR normal in all 4  ASSESSMENT: 1. DM2, non-insulin-dependent, uncontrolled, with complications - h/o aortic dissection  PLAN:  1. Patient with long-standing, recently more uncontrolled diabetes (but still at goal HbA1c 7.0%), on oral antidiabetic regimen with Januvia. She still has great control only on Januvia - this is expensive for her. - We discussed about options for treatment, and I suggested to:  Patient Instructions  Please check with your insurance if they cover: - [place]  100 mg daily in am. Please stop by the lab. - At next visit, we can try Metformin XR/Glumetza or Cycloset if needed - continue checking sugars at different times of the day - check once a day, rotating checks - she is due for eye exams  - check A1c today - Return to clinic in 3 mo with sugar log    Office Visit on 05/10/2014  Component Date Value  Ref Range Status  . Hemoglobin A1C 05/10/2014 6.9* 4.6 - 6.5 % Final   Glycemic Control Guidelines for People with Diabetes:Non Diabetic:  <6%Goal of Therapy: <7%Additional Action Suggested:  >8%    HbA1c is great!

## 2014-05-13 ENCOUNTER — Encounter: Payer: Self-pay | Admitting: *Deleted

## 2014-05-13 ENCOUNTER — Other Ambulatory Visit: Payer: Self-pay | Admitting: *Deleted

## 2014-05-13 MED ORDER — RIVAROXABAN 20 MG PO TABS: 20.0000 mg | ORAL_TABLET | Freq: Every day | ORAL | Status: DC

## 2014-05-13 NOTE — Telephone Encounter (Signed)
Samples of Xarelto given LOT #[id number] EXP: 9//2017

## 2014-05-14 ENCOUNTER — Other Ambulatory Visit: Payer: Self-pay | Admitting: Cardiovascular Disease

## 2014-05-14 NOTE — Telephone Encounter (Signed)
Refilled #90 tablets with 0 refills on 05/10/2014 Refill refused - requested too soon

## 2014-05-17 ENCOUNTER — Telehealth: Payer: Self-pay | Admitting: Cardiovascular Disease

## 2014-05-17 NOTE — Telephone Encounter (Signed)
Informed pt that we did receive transmission on 4-8 and that we do have her scheduled with Dr. Loletha Grayer on 8-17. Pt verbalized understanding.

## 2014-05-17 NOTE — Telephone Encounter (Signed)
New message     Got letter stating that she has not had her device checked recently.  She had a pacemaker check in April and is due to see Dr Sallyanne Kuster in august.  Please call and let her know it everything is ok.

## 2014-05-18 ENCOUNTER — Encounter: Payer: Self-pay | Admitting: Cardiovascular Disease

## 2014-05-18 ENCOUNTER — Telehealth: Payer: Self-pay | Admitting: Cardiovascular Disease

## 2014-05-18 ENCOUNTER — Other Ambulatory Visit: Payer: Self-pay | Admitting: Family Medicine

## 2014-05-20 NOTE — Telephone Encounter (Signed)
Last seen 01/21/14 and filled 02/26/14 #90. Please advise      KP

## 2014-06-03 NOTE — Telephone Encounter (Signed)
Closed encounter °

## 2014-07-15 ENCOUNTER — Encounter: Payer: Medicare Other | Admitting: Cardiovascular Disease

## 2014-07-15 ENCOUNTER — Other Ambulatory Visit: Payer: Self-pay | Admitting: Family Medicine

## 2014-07-16 ENCOUNTER — Telehealth: Payer: Self-pay

## 2014-07-16 NOTE — Telephone Encounter (Signed)
Rx faxed.    KP 

## 2014-07-16 NOTE — Telephone Encounter (Signed)
Last seen  01/21/14 and filled 05/20/14 #90.  Please advise      KP

## 2014-07-16 NOTE — Telephone Encounter (Signed)
Caller name:Alexya Relation to pt: Call back [phone] Pharmacy:[facility]  Reason for call: [facility] called today to say she really needs a refill on her ALPRAZolam [name] 0.5 MG tablet [[id number]], the last couple of months has been very stressful, her husband has had knee replacement surgery, came home for about a month then had a heart attack was in rehab and has now come home this past Friday. She is his only caretaker, very stressful.

## 2014-07-29 ENCOUNTER — Other Ambulatory Visit: Payer: Self-pay | Admitting: Cardiovascular Disease

## 2014-07-29 NOTE — Telephone Encounter (Signed)
Rx was sent to pharmacy electronically. 

## 2014-07-30 ENCOUNTER — Encounter: Payer: Medicare Other | Admitting: Cardiovascular Disease

## 2014-08-12 ENCOUNTER — Encounter: Payer: Self-pay | Admitting: Cardiovascular Disease

## 2014-08-20 ENCOUNTER — Telehealth: Payer: Self-pay

## 2014-08-20 ENCOUNTER — Ambulatory Visit (INDEPENDENT_AMBULATORY_CARE_PROVIDER_SITE_OTHER): Payer: Medicare Other | Admitting: Cardiovascular Disease

## 2014-08-20 ENCOUNTER — Encounter: Payer: Self-pay | Admitting: Cardiovascular Disease

## 2014-08-20 VITALS — BP 124/80 | HR 89 | Resp 20 | Ht 60.0 in | Wt 215.8 lb

## 2014-08-20 DIAGNOSIS — Z950 Presence of cardiac pacemaker: Secondary | ICD-10-CM

## 2014-08-20 DIAGNOSIS — I71019 Dissection of thoracic aorta, unspecified: Secondary | ICD-10-CM

## 2014-08-20 DIAGNOSIS — E785 Hyperlipidemia, unspecified: Secondary | ICD-10-CM

## 2014-08-20 DIAGNOSIS — I498 Other specified cardiac arrhythmias: Secondary | ICD-10-CM

## 2014-08-20 DIAGNOSIS — I7101 Dissection of thoracic aorta: Secondary | ICD-10-CM | POA: Diagnosis not present

## 2014-08-20 DIAGNOSIS — I495 Sick sinus syndrome: Secondary | ICD-10-CM

## 2014-08-20 DIAGNOSIS — I4892 Unspecified atrial flutter: Secondary | ICD-10-CM

## 2014-08-20 DIAGNOSIS — R001 Bradycardia, unspecified: Secondary | ICD-10-CM

## 2014-08-20 DIAGNOSIS — I10 Essential (primary) hypertension: Secondary | ICD-10-CM

## 2014-08-20 DIAGNOSIS — F411 Generalized anxiety disorder: Secondary | ICD-10-CM

## 2014-08-20 LAB — MDC_IDC_ENUM_SESS_TYPE_INCLINIC
Battery Impedance: 110 Ohm
Brady Statistic AP VP Percent: 2 %
Brady Statistic AP VS Percent: 98 %
Brady Statistic AS VP Percent: 0 %
Brady Statistic AS VS Percent: 0 %
Date Time Interrogation Session: 20150922131743
Lead Channel Impedance Value: 410 Ohm
Lead Channel Impedance Value: 748 Ohm
Lead Channel Pacing Threshold Amplitude: 0.75 V
Lead Channel Pacing Threshold Pulse Width: 0.4 ms
Lead Channel Sensing Intrinsic Amplitude: 15.67 mV
Lead Channel Sensing Intrinsic Amplitude: 4 mV
Lead Channel Setting Pacing Amplitude: 1.5 V
Lead Channel Setting Pacing Amplitude: 2 V
MDC IDC MSMT BATTERY REMAINING LONGEVITY: 150 mo
MDC IDC MSMT BATTERY VOLTAGE: 2.79 V
MDC IDC MSMT LEADCHNL RV PACING THRESHOLD AMPLITUDE: 0.75 V
MDC IDC MSMT LEADCHNL RV PACING THRESHOLD PULSEWIDTH: 0.4 ms
MDC IDC SET LEADCHNL RV PACING PULSEWIDTH: 0.4 ms
MDC IDC SET LEADCHNL RV SENSING SENSITIVITY: 5.6 mV

## 2014-08-20 MED ORDER — ALPRAZOLAM 0.5 MG PO TABS: 0.5000 mg | ORAL_TABLET | Freq: Three times a day (TID) | ORAL | Status: DC | PRN

## 2014-08-20 MED ORDER — FENOFIBRATE 160 MG PO TABS: 160.0000 mg | ORAL_TABLET | Freq: Every day | ORAL | Status: DC

## 2014-08-20 NOTE — Progress Notes (Signed)
Patient ID: [name], female   DOB: Sep 18, 1946, 68 y.o.   MRN: [id number]     Reason for office visit Pacemaker check, SSS, atrial flutter, aortic dissection type A  [name] is having trouble psychologically, but no somatic complaints. She is having a hard time with her husband [name]'s physical limitations and the demands this places on her.  She has a history of sinus node dysfunction and has had atrial flutter that was pace-terminated via her dual chamber Medtronic pacemaker (implanted 2007, generator change 08/2013 for symptomatic bradycardia). She had surgical repair of a proximal aortic dissection with aortic valve resuspension in 2008, after having a type B dissection in 2000. She has normal LV systolic function. She sees Dr. Servando Snare every 2 years (last seen February 2015). She has type II DM and hypertriglyceridemia, last A1c 6.9% (Dr. Cruzita Lederer).  She takes furosemide on the average twice a week to control ankle edema.  Pacemaker check shows 98% A paced and <2% V paced. Heart rate histogram distribution is normal, sensor compensating well for chronotropic incompetence. No further atrial flutter since last check.   Allergies  Allergen Reactions  . Oxycodone Itching  . Vicodin [Hydrocodone-Acetaminophen] Itching  . Neomycin-Bacitracin Zn-Polymyx Other (See Comments)    Red , swollen eye  . Sulfonamide Derivatives     Just had reaction when taking during pregnancy- last child birth 42    Current Outpatient Prescriptions  Medication Sig Dispense Refill  . ALPRAZolam (XANAX) 0.5 MG tablet Take 1 tablet (0.5 mg total) by mouth 3 (three) times daily as needed for anxiety.  90 tablet  0  . carvedilol (COREG) 25 MG tablet Take 1 tablet (25 mg total) by mouth 2 (two) times daily.  180 tablet  3  . cholecalciferol (VITAMIN D) 1000 UNITS tablet Take 1,000 Units by mouth daily.      . citalopram (CELEXA) 40 MG tablet 1 tab by mouth daily--appointment due now  90 tablet  3  . CRESTOR 20  MG tablet TAKE 1 TABLET BY MOUTH AT BEDTIME  30 tablet  2  . furosemide (LASIX) 40 MG tablet TAKE 1 TABLET BY MOUTH EVERY DAY AS DIRECTED  90 tablet  0  . hydroxypropyl methylcellulose (ISOPTO TEARS) 2.5 % ophthalmic solution Place 1 drop into both eyes 3 (three) times daily as needed (for dry eyes).      . hyoscyamine (LEVSIN SL) 0.125 MG SL tablet TAKE 1 TABLET BY MOUTH TWICE A DAY AS NEEDED FOR CRAMPING OR DIARRHEA OR LOOSE STOOLS  60 tablet  0  . lansoprazole (PREVACID) 15 MG capsule Take 15 mg by mouth as needed (for heart burn).       . rivaroxaban (XARELTO) 20 MG TABS tablet Take 1 tablet (20 mg total) by mouth daily with supper.  30 tablet  0  . sitaGLIPtin (JANUVIA) 100 MG tablet Take 1 tablet (100 mg total) by mouth daily.  30 tablet  5  . fenofibrate 160 MG tablet Take 1 tablet (160 mg total) by mouth daily.  30 tablet  1   No current facility-administered medications for this visit.    Past Medical History  Diagnosis Date  . Anxiety   . Hyperlipidemia   . Hypertension   . Diabetes mellitus   . GERD (gastroesophageal reflux disease)   . Cancer     skin  . Kidney stones   . Diverticulosis   . Hemorrhoids   . Acute thoracic aortic dissection 2008    emergency surgery -  [facility]  . Sinus node dysfunction     hx of PPM    Past Surgical History  Procedure Laterality Date  . Pacemaker insertion  08/2006    dual chamber Medtronic EnRhythm; r/t sinus node dysfunction   . Appendectomy    . Abdominal hysterectomy  1984  . Thoracic aortic aneurysm repair  2000    type III  . Repair of acute ascending thoracic aortic dissection  2008    Dr. Servando Snare  . Transthoracic echocardiogram  09/20/2012    EF=>55% with mild conc LVH; LA mod dilated; RA mildly dilated; mild MR/TR/AR  . Pacemaker generator change  2014    Medtronic adapta  . Eye surgery  2014    Rght eye    Family History  Problem Relation Age of Onset  . Heart attack Father   . Cancer Neg Hx   . Kidney disease  Mother   . Heart attack Maternal Grandmother   . Valvular heart disease Son     valve replacement at 3    History   Social History  . Marital Status: Married    Spouse Name: N/A    Number of Children: 2  . Years of Education: N/A   Occupational History  .     Social History Main Topics  . Smoking status: Former Smoker    Quit date: 09/04/1993  . Smokeless tobacco: Never Used  . Alcohol Use: No  . Drug Use: No  . Sexual Activity: Not on file   Other Topics Concern  . Not on file   Social History Narrative  . No narrative on file    Review of systems: The patient specifically denies any chest pain at rest or with exertion, dyspnea at rest or with exertion, orthopnea, paroxysmal nocturnal dyspnea, syncope, palpitations, focal neurological deficits, intermittent claudication, lower extremity edema, unexplained weight gain, cough, hemoptysis or wheezing.  The patient also denies abdominal pain, nausea, vomiting, dysphagia, diarrhea, constipation, polyuria, polydipsia, dysuria, hematuria, frequency, urgency, abnormal bleeding or bruising, fever, chills, unexpected weight changes, mood swings, change in skin or hair texture, change in voice quality, auditory or visual problems, allergic reactions or rashes, new musculoskeletal complaints other than usual "aches and pains".   PHYSICAL EXAM BP 124/80  Pulse 89  Resp 20  Ht 5' (1.524 m)  Wt 97.886 kg (215 lb 12.8 oz)  BMI 42.15 kg/m2 General: Alert, oriented x3, no distress  Head: no evidence of trauma, PERRL, EOMI, no exophtalmos or lid lag, no myxedema, no xanthelasma; normal ears, nose and oropharynx  Neck: normal jugular venous pulsations and no hepatojugular reflux; brisk carotid pulses without delay and no carotid bruits  Chest: clear to auscultation, no signs of consolidation by percussion or palpation, normal fremitus, symmetrical and full respiratory excursions; Sternotomy scar; left subclavian pacemaker scar healed  well  Cardiovascular: normal position and quality of the apical impulse, regular rhythm, normal first and second heart sounds, no murmurs, rubs or gallops  Abdomen: no tenderness or distention, no masses by palpation, no abnormal pulsatility or arterial bruits, normal bowel sounds, no hepatosplenomegaly  Extremities: no clubbing, cyanosis or edema; 2+ radial, ulnar and brachial pulses bilaterally; 2+ right femoral, posterior tibial and dorsalis pedis pulses; 2+ left femoral, posterior tibial and dorsalis pedis pulses; no subclavian or femoral bruits  Neurological: grossly nonfocal   EKG: Apaced, Vsensed, LVH  Lipid Panel     Component Value Date/Time   CHOL 160 01/21/2014 1100   TRIG 95.0 01/21/2014 1100  HDL 62.90 01/21/2014 1100   CHOLHDL 3 01/21/2014 1100   VLDL 19.0 01/21/2014 1100   LDLCALC 78 01/21/2014 1100    BMET    Component Value Date/Time   NA 140 01/21/2014 1100   K 4.0 01/21/2014 1100   CL 102 01/21/2014 1100   CO2 28 01/21/2014 1100   GLUCOSE 126* 01/21/2014 1100   BUN 21 01/21/2014 1100   CREATININE 0.9 01/21/2014 1100   CREATININE 0.74 01/01/2014 1301   CALCIUM 9.7 01/21/2014 1100   GFRNONAA >60 06/18/2011 1638   GFRAA >60 06/18/2011 1638     ASSESSMENT AND PLAN HTN Controlled Hypertryglyceridemia In target range after improved glycemic control, on fenofibrate  Paroxysmal atrial flutter  Continue lifelong anticoagulation, No indication for antiarrhythmics at this point.  Pacemaker - Medtronic Adapta dual chamber2014, initial implantation 2007  Normal device function. Proximal dissection of the aorta status post graft repair and aortic valve resuspension Important to keep BP in low normal range, keep on high dose beta blocker.  Anxiety and depression I agreed to refill her anxiolytic until her f/u appt with PCP Orders Placed This Encounter  Procedures  . Implantable device check  . EKG 12-Lead  . 2D Echocardiogram without contrast   Meds ordered this encounter    Medications  . ALPRAZolam (XANAX) 0.5 MG tablet    Sig: Take 1 tablet (0.5 mg total) by mouth 3 (three) times daily as needed for anxiety.    Dispense:  90 tablet    Refill:  0    Future refills from PCP    [facility]  Sanda Klein, MD, [facility] [phone] office [phone] pager

## 2014-08-20 NOTE — Telephone Encounter (Addendum)
Rx sent by Cardiologist today for Xanax. Refill not done.     KP

## 2014-08-20 NOTE — Patient Instructions (Addendum)
Your physician has requested that you have an echocardiogram. Echocardiography is a painless test that uses sound waves to create images of your heart. It provides your doctor with information about the size and shape of your heart and how well your heart's chambers and valves are working. This procedure takes approximately one hour. There are no restrictions for this procedure.   Please send husband's pacemaker transmission for Optivol on 09-09-2014.  Remote monitoring is used to monitor your pacemaker from home. This monitoring reduces the number of office visits required to check your device to one time per year. It allows [name] to keep an eye on the functioning of your device to ensure it is working properly. You are scheduled for a device check from home on 11-20-2014. You may send your transmission at any time that day. If you have a wireless device, the transmission will be sent automatically. After your physician reviews your transmission, you will receive a postcard with your next transmission date.   Your physician recommends that you schedule a follow-up appointment in: 6 months + pacemaker check

## 2014-08-20 NOTE — Telephone Encounter (Signed)
[facility] [phone]) [phone] (H) [id number] [address], [place] called and she needs refills on her ALPRAZolam [name] 0.5 MG tablet /   fenofibrate 160 MG tablet, she made a appointment for CPE in Nov.

## 2014-08-23 ENCOUNTER — Ambulatory Visit (INDEPENDENT_AMBULATORY_CARE_PROVIDER_SITE_OTHER): Payer: Medicare Other | Admitting: Internal Medicine

## 2014-08-23 ENCOUNTER — Encounter: Payer: Self-pay | Admitting: Internal Medicine

## 2014-08-23 VITALS — BP 102/49 | HR 92 | Ht 60.0 in | Wt 213.0 lb

## 2014-08-23 DIAGNOSIS — Z23 Encounter for immunization: Secondary | ICD-10-CM

## 2014-08-23 DIAGNOSIS — E1159 Type 2 diabetes mellitus with other circulatory complications: Secondary | ICD-10-CM

## 2014-08-23 DIAGNOSIS — E119 Type 2 diabetes mellitus without complications: Secondary | ICD-10-CM

## 2014-08-23 LAB — HEMOGLOBIN A1C: HEMOGLOBIN A1C: 7.2 % — AB (ref 4.6–6.5)

## 2014-08-23 MED ORDER — SITAGLIPTIN PHOSPHATE 100 MG PO TABS: 100.0000 mg | ORAL_TABLET | Freq: Every day | ORAL | Status: DC

## 2014-08-23 MED ORDER — METFORMIN HCL ER 500 MG PO TB24: 1000.0000 mg | ORAL_TABLET | Freq: Every day | ORAL | Status: DC

## 2014-08-23 NOTE — Progress Notes (Signed)
Patient ID: [name], female   DOB: 1946-11-07, 68 y.o.   MRN: [id number]  HPI: [name] is a 68 y.o.-year-old female, returning for f/u for DM2, dx 08/2006, non-insulin-dependent, uncontrolled, with complications (h/o Ao dissection). Last visit 3.5 mo ago.  She is very stressed: husband stroke survivor - paralyzed on L side and many comorbidities. He had an AMI in July >> was hospitalized, then in [place] [facility].   Her heart murmur intensified >> will have a 2D Echo in 10 days (Dr. Sallyanne Kuster).  Last hemoglobin A1c was: Lab Results  Component Value Date   HGBA1C 6.9* 05/10/2014   HGBA1C 7.0* 01/21/2014   HGBA1C 6.7* 05/04/2013   Pt is on a regimen of: - Januvia 100 mg She tried Metformin at Dx >> diarrhea   Pt checks her sugars  2x a day - but not many checks in her log: - am: 102-140 after a good night, 150 after a "bad" night >> 100-140 >> 110-120, in 07/2014: 130-160 - 2h after b'fast: n/c - before lunch: n/c >> 128-161 - 2h after lunch: n/c >> 170 - before dinner: 150-158 >> 110-168 >> 95-131 - 2h after dinner: n/c - bedtime: n/c - nighttime: n/c No lows. Lowest sugar was 100;? if she has hypoglycemia awareness Highest sugar was 161 x 1.  She exercises more. She cut out the majority of her sweets.  Pt's meals are: - Breakfast: eggs/cereals/toast/bacon - Lunch: sandwich (wheat)/ cottage cheese + oregano/ salads - Dinner: veggies/meat/cottage cheese/salads - Snacks: PB crackers or cottage cheese She is walking daily.  - no CKD, last BUN/creatinine:  Lab Results  Component Value Date   BUN 21 01/21/2014   CREATININE 0.9 01/21/2014  NO ACEI.  - last set of lipids: Lab Results  Component Value Date   CHOL 160 01/21/2014   HDL 62.90 01/21/2014   LDLCALC 78 01/21/2014   LDLDIRECT 217.7 09/13/2011   TRIG 95.0 01/21/2014   CHOLHDL 3 01/21/2014  On Crestor.  - last eye exam was in 03/2013. No DR. She will have it in 09/2014. - no numbness and tingling in her  feet.  I reviewed her chart and she also has a history of 2x thoracic Ao aneurysm. She has a pacemaker.  I reviewed pt's medications, allergies, PMH, social hx, family hx and no changes required, except as mentioned above.  ROS: Constitutional: no weight gain/loss, + fatigue Eyes: no blurry vision, no xerophthalmia ENT: no sore throat, no nodules palpated in throat, no dysphagia/odynophagia, no hoarseness Cardiovascular: no CP/SOB/palpitations/+ leg swelling Respiratory: no cough/SOB Gastrointestinal: no N/V/D/C Musculoskeletal: no muscle/joint aches Skin: no rashes Neurological: no tremors/numbness/tingling/dizziness  PE: BP 102/49  Pulse 92  Ht 5' (1.524 m)  Wt 213 lb (96.616 kg)  BMI 41.60 kg/m2  SpO2 99% Wt Readings from Last 3 Encounters:  08/23/14 213 lb (96.616 kg)  08/20/14 215 lb 12.8 oz (97.886 kg)  05/10/14 216 lb (97.977 kg)   Constitutional: overweight, in NAD Eyes: PERRLA, EOMI, no exophthalmos ENT: moist mucous membranes, no thyromegaly, no cervical lymphadenopathy Cardiovascular: RRR, + 2/6 SEM + periankle edema B Respiratory: CTA B Gastrointestinal: abdomen soft, NT, ND, BS+ Musculoskeletal: no deformities, strength intact in all 4 Skin: moist, warm, no rashes Neurological: no tremor with outstretched hands, DTR normal in all 4  ASSESSMENT: 1. DM2, non-insulin-dependent, uncontrolled, with complications - h/o aortic dissection  PLAN:  1. Patient with long-standing, fairly well controlled diabetes, on oral antidiabetic regimen with Januvia. She has higher sugars in  am  - We discussed about options for treatment, and I suggested to add a lower dose Metformin XR with dinner:  Patient Instructions  Please start Metformin XR 500 mg daily at dinnertime x 1 week and then increase to 1000 mg daily. Continue Januvia 100 mg in am. Please stop at the lab. Please come back for a follow-up appointment in 3 months with your sugar log. - continue checking sugars  at different times of the day - check once a day, rotating checks - she is due for eye exams >> coming up in 09/2014 - check A1c today - will give her the flu vaccine today - Return to clinic in 3 mo with sugar log    Office Visit on 68/25/2015  Component Date Value Ref Range Status  . Hemoglobin A1C 08/23/2014 7.2* 4.6 - 6.5 % Final   Glycemic Control Guidelines for People with Diabetes:Non Diabetic:  <6%Goal of Therapy: <7%Additional Action Suggested:  >8%    HbA1c a little higher. See plan above.

## 2014-08-23 NOTE — Progress Notes (Signed)
Pre visit review using our clinic review tool, if applicable. No additional management support is needed unless otherwise documented below in the visit note. 

## 2014-08-23 NOTE — Patient Instructions (Signed)
Please start Metformin XR 500 mg daily at dinnertime x 1 week and then increase to 1000 mg daily. Continue Januvia 100 mg in am.  Please stop at the lab.  Please come back for a follow-up appointment in 3 months with your sugar log.

## 2014-08-26 ENCOUNTER — Encounter: Payer: Self-pay | Admitting: Internal Medicine

## 2014-08-27 ENCOUNTER — Encounter: Payer: Self-pay | Admitting: Internal Medicine

## 2014-08-28 ENCOUNTER — Encounter: Payer: Self-pay | Admitting: Internal Medicine

## 2014-08-28 ENCOUNTER — Other Ambulatory Visit: Payer: Self-pay | Admitting: Internal Medicine

## 2014-08-28 DIAGNOSIS — E119 Type 2 diabetes mellitus without complications: Secondary | ICD-10-CM

## 2014-08-28 MED ORDER — GLIPIZIDE ER 5 MG PO TB24: 5.0000 mg | ORAL_TABLET | Freq: Every day | ORAL | Status: DC

## 2014-08-30 ENCOUNTER — Encounter: Payer: Self-pay | Admitting: Cardiovascular Disease

## 2014-09-02 ENCOUNTER — Ambulatory Visit (HOSPITAL_COMMUNITY)
Admission: RE | Admit: 2014-09-02 | Discharge: 2014-09-02 | Disposition: A | Discharge status: Home / Self Care | Payer: Medicare Other | Source: Ambulatory Visit | Attending: Cardiology | Admitting: Cardiology

## 2014-09-02 DIAGNOSIS — E119 Type 2 diabetes mellitus without complications: Secondary | ICD-10-CM | POA: Insufficient documentation

## 2014-09-02 DIAGNOSIS — I4892 Unspecified atrial flutter: Secondary | ICD-10-CM

## 2014-09-02 DIAGNOSIS — I10 Essential (primary) hypertension: Secondary | ICD-10-CM | POA: Diagnosis not present

## 2014-09-02 DIAGNOSIS — I359 Nonrheumatic aortic valve disorder, unspecified: Secondary | ICD-10-CM | POA: Diagnosis not present

## 2014-09-02 DIAGNOSIS — I358 Other nonrheumatic aortic valve disorders: Secondary | ICD-10-CM | POA: Diagnosis not present

## 2014-09-02 NOTE — Progress Notes (Signed)
2D Echo Performed 09/02/2014    [name], RCS

## 2014-09-25 ENCOUNTER — Other Ambulatory Visit: Payer: Self-pay | Admitting: Cardiovascular Disease

## 2014-09-25 NOTE — Telephone Encounter (Signed)
Xanax refill refused >> defer to PCP

## 2014-10-02 ENCOUNTER — Telehealth: Payer: Self-pay | Admitting: Family Medicine

## 2014-10-02 NOTE — Telephone Encounter (Signed)
Can have 30 or wait for PCP tomorrow

## 2014-10-02 NOTE — Telephone Encounter (Signed)
She cannot get a refill on the alprazolam 0.5mg   Take 3 per day.  [facility] [address]

## 2014-10-02 NOTE — Telephone Encounter (Signed)
Last seen 01/21/14 and filled 07/08/14 by Sanda Klein, MD who advises addition refill be done by the PCP.  Please advise in Dr.Lowne's absence    KP

## 2014-10-03 ENCOUNTER — Ambulatory Visit (INDEPENDENT_AMBULATORY_CARE_PROVIDER_SITE_OTHER): Payer: Medicare Other | Admitting: Family Medicine

## 2014-10-03 ENCOUNTER — Encounter: Payer: Self-pay | Admitting: Family Medicine

## 2014-10-03 ENCOUNTER — Other Ambulatory Visit: Payer: Self-pay | Admitting: Cardiovascular Disease

## 2014-10-03 VITALS — BP 124/72 | HR 73 | Temp 98.2°F | Wt 224.0 lb

## 2014-10-03 DIAGNOSIS — J0110 Acute frontal sinusitis, unspecified: Secondary | ICD-10-CM

## 2014-10-03 MED ORDER — FLUTICASONE PROPIONATE 50 MCG/ACT NA SUSP: 2.0000 | Freq: Every day | NASAL | Status: DC

## 2014-10-03 MED ORDER — CEFUROXIME AXETIL 500 MG PO TABS: 500.0000 mg | ORAL_TABLET | Freq: Two times a day (BID) | ORAL | Status: AC

## 2014-10-03 MED ORDER — ALPRAZOLAM 0.5 MG PO TABS: 0.5000 mg | ORAL_TABLET | Freq: Three times a day (TID) | ORAL | Status: DC | PRN

## 2014-10-03 NOTE — Progress Notes (Signed)
Pre visit review using our clinic review tool, if applicable. No additional management support is needed unless otherwise documented below in the visit note. 

## 2014-10-03 NOTE — Telephone Encounter (Signed)
Rx faxed.    KP 

## 2014-10-03 NOTE — Patient Instructions (Signed)

## 2014-10-03 NOTE — Progress Notes (Signed)
Subjective:     [name] is a 68 y.o. female who presents for evaluation of symptoms of a URI. Symptoms include bilateral ear pressure/pain, achiness, congestion, fever 101, nasal congestion, productive cough with  green colored sputum, purulent nasal discharge, sinus pressure and sore throat. Onset of symptoms was 2 days ago, and has been gradually worsening since that time. Treatment to date: tylenol.  The following portions of the patient's history were reviewed and updated as appropriate:  She  has a past medical history of Anxiety; Hyperlipidemia; Hypertension; Diabetes mellitus; GERD (gastroesophageal reflux disease); Cancer; Kidney stones; Diverticulosis; Hemorrhoids; Acute thoracic aortic dissection (2008); and Sinus node dysfunction. She  does not have any pertinent problems on file. She  has past surgical history that includes Pacemaker insertion (08/2006); Appendectomy; Abdominal hysterectomy (1984); Thoracic aortic aneurysm repair (2000); Repair of acute ascending thoracic aortic dissection (2008); transthoracic echocardiogram (09/20/2012); Pacemaker generator change (2014); and Eye surgery (2014). Her family history includes Heart attack in her father and maternal grandmother; Kidney disease in her mother; Valvular heart disease in her son. There is no history of Cancer. She  reports that she quit smoking about 21 years ago. She has never used smokeless tobacco. She reports that she does not drink alcohol or use illicit drugs. She has a current medication list which includes the following prescription(s): alprazolam, carvedilol, cholecalciferol, citalopram, crestor, fenofibrate, furosemide, glipizide, hyoscyamine, lansoprazole, rivaroxaban, and sitagliptin. Current Outpatient Prescriptions on File Prior to Visit  Medication Sig Dispense Refill  . ALPRAZolam (XANAX) 0.5 MG tablet Take 1 tablet (0.5 mg total) by mouth 3 (three) times daily as needed for anxiety. 90 tablet 0  .  carvedilol (COREG) 25 MG tablet Take 1 tablet (25 mg total) by mouth 2 (two) times daily. 180 tablet 3  . cholecalciferol (VITAMIN D) 1000 UNITS tablet Take 1,000 Units by mouth daily.    . citalopram (CELEXA) 40 MG tablet 1 tab by mouth daily--appointment due now 90 tablet 3  . CRESTOR 20 MG tablet TAKE 1 TABLET BY MOUTH AT BEDTIME 30 tablet 2  . fenofibrate 160 MG tablet Take 1 tablet (160 mg total) by mouth daily. 30 tablet 1  . furosemide (LASIX) 40 MG tablet TAKE 1 TABLET BY MOUTH EVERY DAY AS DIRECTED 90 tablet 0  . glipiZIDE (GLUCOTROL XL) 5 MG 24 hr tablet Take 1 tablet (5 mg total) by mouth daily with breakfast. 30 tablet 3  . hyoscyamine (LEVSIN SL) 0.125 MG SL tablet TAKE 1 TABLET BY MOUTH TWICE A DAY AS NEEDED FOR CRAMPING OR DIARRHEA OR LOOSE STOOLS 60 tablet 0  . lansoprazole (PREVACID) 15 MG capsule Take 15 mg by mouth as needed (for heart burn).     . rivaroxaban (XARELTO) 20 MG TABS tablet Take 1 tablet (20 mg total) by mouth daily with supper. 30 tablet 0  . sitaGLIPtin (JANUVIA) 100 MG tablet Take 1 tablet (100 mg total) by mouth daily. 30 tablet 5   No current facility-administered medications on file prior to visit.   She is allergic to oxycodone; vicodin; neomycin-bacitracin zn-polymyx; and sulfonamide derivatives..  Review of Systems Pertinent items are noted in HPI.   Objective:    BP 124/72 mmHg  Pulse 73  Temp(Src) 98.2 F (36.8 C) (Oral)  Wt 224 lb (101.606 kg)  SpO2 96% General appearance: alert, cooperative, appears stated age and no distress Ears: tm dull-- + fluid b/l Nose: green discharge, moderate congestion, turbinates red, swollen, sinus tenderness bilateral Throat: abnormal findings: mild oropharyngeal  erythema Neck: mild anterior cervical adenopathy, supple, symmetrical, trachea midline and thyroid not enlarged, symmetric, no tenderness/mass/nodules Lungs: clear to auscultation bilaterally Heart: S1, S2 normal Extremities: extremities normal,  atraumatic, no cyanosis or edema   Assessment:    sinusitis and viral upper respiratory illness   Plan:    Discussed the diagnosis and treatment of sinusitis. Nasal saline spray for congestion. Ceftin per orders. Nasal steroids per orders. Follow up as needed.

## 2014-10-03 NOTE — Telephone Encounter (Signed)
Ok to refill #90.   

## 2014-10-03 NOTE — Telephone Encounter (Signed)
[facility]

## 2014-11-02 ENCOUNTER — Other Ambulatory Visit: Payer: Self-pay | Admitting: Cardiovascular Disease

## 2014-11-02 NOTE — Telephone Encounter (Signed)
Rx was sent to pharmacy electronically. 

## 2014-11-07 ENCOUNTER — Encounter (HOSPITAL_COMMUNITY): Payer: Self-pay | Admitting: Cardiovascular Disease

## 2014-11-10 ENCOUNTER — Other Ambulatory Visit: Payer: Self-pay | Admitting: Cardiovascular Disease

## 2014-11-16 ENCOUNTER — Other Ambulatory Visit: Payer: Self-pay | Admitting: Family Medicine

## 2014-11-18 ENCOUNTER — Ambulatory Visit: Payer: Medicare Other | Admitting: Internal Medicine

## 2014-11-18 NOTE — Telephone Encounter (Signed)
Last seen and filled 10/03/14 #90.  UDS 01/2013 low risk  Please advise     KP

## 2014-11-19 ENCOUNTER — Telehealth: Payer: Self-pay | Admitting: *Deleted

## 2014-11-19 DIAGNOSIS — E785 Hyperlipidemia, unspecified: Secondary | ICD-10-CM

## 2014-11-19 MED ORDER — FENOFIBRATE 160 MG PO TABS: 160.0000 mg | ORAL_TABLET | Freq: Every day | ORAL | Status: DC

## 2014-11-19 NOTE — Telephone Encounter (Signed)
[facility]  30 day supply sent.  Patient is due for labs for cholesterol check.  Order in.  Please schedule.

## 2014-11-20 ENCOUNTER — Encounter: Payer: Medicare Other | Admitting: *Deleted

## 2014-11-20 ENCOUNTER — Telehealth: Payer: Self-pay | Admitting: Cardiology

## 2014-11-20 NOTE — Telephone Encounter (Signed)
LMOVM reminding pt to send remote transmission.   

## 2014-11-27 ENCOUNTER — Encounter: Payer: Self-pay | Admitting: Cardiology

## 2014-12-04 ENCOUNTER — Ambulatory Visit (INDEPENDENT_AMBULATORY_CARE_PROVIDER_SITE_OTHER): Payer: Medicare Other | Admitting: *Deleted

## 2014-12-04 DIAGNOSIS — I495 Sick sinus syndrome: Secondary | ICD-10-CM

## 2014-12-04 NOTE — Progress Notes (Signed)
Remote pacemaker transmission.   

## 2014-12-05 LAB — MDC_IDC_ENUM_SESS_TYPE_REMOTE
Battery Impedance: 110 Ohm
Battery Remaining Longevity: 150 mo
Battery Voltage: 2.79 V
Brady Statistic AP VP Percent: 5 %
Brady Statistic AP VS Percent: 95 %
Brady Statistic AS VS Percent: 0 %
Date Time Interrogation Session: 20160106183053
Lead Channel Impedance Value: 415 Ohm
Lead Channel Pacing Threshold Amplitude: 0.625 V
Lead Channel Pacing Threshold Amplitude: 0.625 V
Lead Channel Pacing Threshold Pulse Width: 0.4 ms
Lead Channel Pacing Threshold Pulse Width: 0.4 ms
Lead Channel Sensing Intrinsic Amplitude: 16 mV
Lead Channel Setting Pacing Pulse Width: 0.4 ms
Lead Channel Setting Sensing Sensitivity: 5.6 mV
MDC IDC MSMT LEADCHNL RV IMPEDANCE VALUE: 788 Ohm
MDC IDC SET LEADCHNL RA PACING AMPLITUDE: 1.5 V
MDC IDC SET LEADCHNL RV PACING AMPLITUDE: 2 V
MDC IDC STAT BRADY AS VP PERCENT: 0 %

## 2014-12-13 ENCOUNTER — Encounter: Payer: Self-pay | Admitting: Cardiology

## 2014-12-14 ENCOUNTER — Other Ambulatory Visit: Payer: Self-pay | Admitting: Family Medicine

## 2014-12-16 NOTE — Telephone Encounter (Signed)
Please scheduled a lab apt.      KP

## 2014-12-16 NOTE — Telephone Encounter (Signed)
The orders have been placed previously.     KP

## 2014-12-16 NOTE — Telephone Encounter (Signed)
Informed patient of refill and she scheduled lab appointment for Thursday afternoon. Please order! Thanks!

## 2014-12-18 NOTE — Telephone Encounter (Signed)
Labs ordered and apt scheduled for 12/19/14.   eal

## 2014-12-19 ENCOUNTER — Other Ambulatory Visit (INDEPENDENT_AMBULATORY_CARE_PROVIDER_SITE_OTHER): Payer: Medicare Other

## 2014-12-19 ENCOUNTER — Encounter: Payer: Self-pay | Admitting: Cardiovascular Disease

## 2014-12-19 DIAGNOSIS — E785 Hyperlipidemia, unspecified: Secondary | ICD-10-CM

## 2014-12-19 LAB — HEPATIC FUNCTION PANEL
ALBUMIN: 4.1 g/dL (ref 3.5–5.2)
ALT: 16 U/L (ref 0–35)
AST: 18 U/L (ref 0–37)
Alkaline Phosphatase: 51 U/L (ref 39–117)
BILIRUBIN DIRECT: 0.1 mg/dL (ref 0.0–0.3)
Total Bilirubin: 0.4 mg/dL (ref 0.2–1.2)
Total Protein: 6.8 g/dL (ref 6.0–8.3)

## 2014-12-19 LAB — LIPID PANEL
Cholesterol: 153 mg/dL (ref 0–200)
HDL: 53.1 mg/dL (ref >39.00)
LDL CALC: 67 mg/dL (ref 0–99)
NonHDL: 99.9
TRIGLYCERIDES: 163 mg/dL — AB (ref 0.0–149.0)
Total CHOL/HDL Ratio: 3
VLDL: 32.6 mg/dL (ref 0.0–40.0)

## 2014-12-22 ENCOUNTER — Other Ambulatory Visit: Payer: Self-pay | Admitting: Internal Medicine

## 2014-12-22 ENCOUNTER — Other Ambulatory Visit: Payer: Self-pay | Admitting: Family Medicine

## 2014-12-23 NOTE — Telephone Encounter (Signed)
Last seen 10/03/14 and filled 11/18/14 #90 UDS 02/21/13 low risk.  Please advise     KP

## 2014-12-27 ENCOUNTER — Encounter: Payer: Self-pay | Admitting: Cardiology

## 2015-01-12 ENCOUNTER — Other Ambulatory Visit: Payer: Self-pay | Admitting: Family Medicine

## 2015-01-16 ENCOUNTER — Telehealth: Payer: Self-pay | Admitting: Family Medicine

## 2015-01-16 MED ORDER — HYOSCYAMINE SULFATE 0.125 MG SL SUBL: SUBLINGUAL_TABLET | SUBLINGUAL | Status: DC

## 2015-01-16 NOTE — Telephone Encounter (Signed)
Caller name:Teddy Spike  Relationship to patient:self Can be reached:[phone] [facility] [address]  Reason for call:Requesting refill on hyoscyamine (LEVSIN SL) 0.125 MG SL tablet

## 2015-01-16 NOTE — Telephone Encounter (Signed)
Rx faxed.    KP 

## 2015-02-01 ENCOUNTER — Other Ambulatory Visit: Payer: Self-pay | Admitting: Family Medicine

## 2015-02-03 ENCOUNTER — Other Ambulatory Visit: Payer: Self-pay | Admitting: Family Medicine

## 2015-02-03 NOTE — Telephone Encounter (Signed)
Last filled:  12/23/14 Amt: 90, 0 Last OV:  10/03/14 Contract on file.  UDS-LOW risk  Please advise.

## 2015-02-07 ENCOUNTER — Other Ambulatory Visit: Payer: Self-pay | Admitting: Family Medicine

## 2015-02-09 ENCOUNTER — Other Ambulatory Visit: Payer: Self-pay | Admitting: Family Medicine

## 2015-03-10 ENCOUNTER — Other Ambulatory Visit: Payer: Self-pay | Admitting: Family Medicine

## 2015-03-11 NOTE — Telephone Encounter (Signed)
Last seen 10/03/14 and filled 02/03/15 #90 no refills UDS 02/21/13 low risk    Please advise      KP

## 2015-04-01 ENCOUNTER — Encounter: Payer: Medicare Other | Admitting: Cardiovascular Disease

## 2015-04-03 ENCOUNTER — Other Ambulatory Visit: Payer: Self-pay | Admitting: Family Medicine

## 2015-04-03 NOTE — Telephone Encounter (Signed)
last seen 12/26/14 and filled 03/06/15 #90   Please advise     KP

## 2015-04-19 ENCOUNTER — Other Ambulatory Visit: Payer: Self-pay | Admitting: Internal Medicine

## 2015-04-24 ENCOUNTER — Encounter: Payer: Self-pay | Admitting: Internal Medicine

## 2015-04-24 ENCOUNTER — Ambulatory Visit (INDEPENDENT_AMBULATORY_CARE_PROVIDER_SITE_OTHER): Payer: Medicare Other | Admitting: Internal Medicine

## 2015-04-24 ENCOUNTER — Other Ambulatory Visit: Payer: Self-pay | Admitting: *Deleted

## 2015-04-24 VITALS — BP 114/68 | HR 88 | Temp 98.1°F | Resp 14 | Wt 223.8 lb

## 2015-04-24 DIAGNOSIS — E1159 Type 2 diabetes mellitus with other circulatory complications: Secondary | ICD-10-CM

## 2015-04-24 DIAGNOSIS — E1165 Type 2 diabetes mellitus with hyperglycemia: Secondary | ICD-10-CM

## 2015-04-24 DIAGNOSIS — E119 Type 2 diabetes mellitus without complications: Secondary | ICD-10-CM | POA: Insufficient documentation

## 2015-04-24 DIAGNOSIS — E1151 Type 2 diabetes mellitus with diabetic peripheral angiopathy without gangrene: Secondary | ICD-10-CM | POA: Insufficient documentation

## 2015-04-24 DIAGNOSIS — IMO0002 Reserved for concepts with insufficient information to code with codable children: Secondary | ICD-10-CM | POA: Insufficient documentation

## 2015-04-24 LAB — HEMOGLOBIN A1C: Hgb A1c MFr Bld: 6.8 % — ABNORMAL HIGH (ref 4.6–6.5)

## 2015-04-24 MED ORDER — GLIPIZIDE ER 2.5 MG PO TB24: 2.5000 mg | ORAL_TABLET | Freq: Every day | ORAL | Status: DC

## 2015-04-24 NOTE — Progress Notes (Signed)
Patient ID: KYRSTIN CAMPILLO, female   DOB: 05-14-1946, 69 y.o.   MRN: [id number]  HPI: [name] is a 69 y.o.-year-old female, returning for f/u for DM2, dx 08/2006, non-insulin-dependent, uncontrolled, with complications (h/o Ao dissection). Last visit 8 mo ago!  She is very stressed: husband stroke survivor - paralyzed on L side and many comorbidities. He had an AMI in 05/2014 >> was hospitalized, then in [facility]. He then has a TIA, then had a fall >> L3 fracture >> [facility]. He is now home.  She has a heart murmur (sees Dr. Sallyanne Kuster).  Last hemoglobin A1c was: Lab Results  Component Value Date   HGBA1C 7.2* 08/23/2014   HGBA1C 6.9* 05/10/2014   HGBA1C 7.0* 01/21/2014   Pt is on a regimen of: - Januvia 100 mg - Glipizide XL 5 mg in am Metformin ER  1000 mg/day added 07/2014 >> diarrhea She tried Metformin at Dx >> diarrhea/  Pt checks her sugars  2x a day - but not many checks in her log: - am: 100-140 >> 110-120, in 07/2014: 130-160 >> 136-158 - 2h after b'fast: n/c - before lunch: n/c >> 128-161 >> n/c - 2h after lunch: n/c >> 170 - before dinner: 150-158 >> 110-168 >> 95-131 >> 76-130 - 2h after dinner: n/c >> 130-134 - bedtime: n/c - nighttime: n/c No lows. Lowest sugar was 100 >> 76; Has hypoglycemia awareness 80. Highest sugar was 161 x 1 >> 180.  Pt's meals are: - Breakfast: eggs/cereals/toast/bacon - Lunch: sandwich (wheat)/ cottage cheese + oregano/ salads - Dinner: veggies/meat/cottage cheese/salads - Snacks: PB crackers or cottage cheese She is walking daily.  - no CKD, last BUN/creatinine:  Lab Results  Component Value Date   BUN 21 01/21/2014   CREATININE 0.9 01/21/2014  No ACEI.  - last set of lipids: Lab Results  Component Value Date   CHOL 153 12/19/2014   HDL 53.10 12/19/2014   LDLCALC 67 12/19/2014   LDLDIRECT 217.7 09/13/2011   TRIG 163.0* 12/19/2014   CHOLHDL 3 12/19/2014  On Crestor.  - last eye exam was in 2014.  - no  numbness and tingling in her feet.  I reviewed her chart and she also has a history of 2x thoracic Ao aneurysm. She has a pacemaker.  I reviewed pt's medications, allergies, PMH, social hx, family hx, and changes were documented in the history of present illness. Otherwise, unchanged from my initial visit note.  ROS: Constitutional: no weight gain/loss, + fatigue Eyes: no blurry vision, no xerophthalmia ENT: no sore throat, no nodules palpated in throat, no dysphagia/odynophagia, no hoarseness Cardiovascular: no CP/SOB/palpitations/+ leg swelling Respiratory: no cough/SOB Gastrointestinal: no N/V/D/C Musculoskeletal: no muscle/joint aches Skin: no rashes Neurological: no tremors/numbness/tingling/+ dizziness  PE: BP 114/68 mmHg  Pulse 88  Temp(Src) 98.1 F (36.7 C) (Oral)  Resp 14  Wt 223 lb 12.8 oz (101.515 kg)  SpO2 95% Wt Readings from Last 3 Encounters:  04/24/15 223 lb 12.8 oz (101.515 kg)  10/03/14 224 lb (101.606 kg)  08/23/14 213 lb (96.616 kg)   Constitutional: overweight, in NAD Eyes: PERRLA, EOMI, no exophthalmos ENT: moist mucous membranes, no thyromegaly, no cervical lymphadenopathy Cardiovascular: RRR, + 2/6 SEM + periankle edema B Respiratory: CTA B Gastrointestinal: abdomen soft, NT, ND, BS+ Musculoskeletal: no deformities, strength intact in all 4 Skin: moist, warm, no rashes Neurological: no tremor with outstretched hands, DTR normal in all 4  ASSESSMENT: 1. DM2, non-insulin-dependent, uncontrolled, with complications - h/o 2x aortic dissection,  now SEM - Dr Sallyanne Kuster  PLAN:  1. Patient with long-standing, fairly well controlled diabetes, on oral antidiabetic regimen with Januvia. She still has higher sugars in am but lows later in the day >> will decrease the Glipizide XL dose. I advised her tp check some sugars at bedtime to see if the high sugars in am are due to hyperglycemia after dinner.  - I suggested: Patient Instructions  Please continue: -  Januvia 100 mg in am   Please decrease: - Glipizide XL to 2.5 mg in am  Please stop at the lab.  Please return in 3 months with your sugar log.   - continue checking sugars at different times of the day - check once a day, rotating checks - she is due for eye exams >> advised to schedule - check A1c today - Return to clinic in 3 mo with sugar log    Office Visit on 04/24/2015  Component Date Value Ref Range Status  . Hgb A1c MFr Bld 04/24/2015 6.8* 4.6 - 6.5 % Final   Glycemic Control Guidelines for People with Diabetes:Non Diabetic:  <6%Goal of Therapy: <7%Additional Action Suggested:  >8%    hemoglobin A1c is great!

## 2015-04-24 NOTE — Patient Instructions (Signed)
Please continue: - Januvia 100 mg in am   Please decrease: - Glipizide XL to 2.5 mg in am  Please stop at the lab.  Please return in 3 months with your sugar log.

## 2015-05-05 ENCOUNTER — Other Ambulatory Visit: Payer: Self-pay | Admitting: Family Medicine

## 2015-05-05 ENCOUNTER — Other Ambulatory Visit: Payer: Self-pay | Admitting: Cardiovascular Disease

## 2015-05-13 ENCOUNTER — Other Ambulatory Visit: Payer: Self-pay | Admitting: Family Medicine

## 2015-05-14 NOTE — Telephone Encounter (Signed)
CSC and UDS signed 2014. No recent contract or UDS on file.  Rx printed and forwarded to Provider for signature.

## 2015-05-16 ENCOUNTER — Encounter: Payer: Self-pay | Admitting: General Practice

## 2015-05-26 ENCOUNTER — Other Ambulatory Visit: Payer: Self-pay

## 2015-06-04 ENCOUNTER — Encounter: Payer: Self-pay | Admitting: Cardiology

## 2015-06-04 ENCOUNTER — Ambulatory Visit (INDEPENDENT_AMBULATORY_CARE_PROVIDER_SITE_OTHER): Payer: Medicare Other | Admitting: Cardiology

## 2015-06-04 VITALS — BP 124/74 | HR 91 | Ht 61.0 in | Wt 224.4 lb

## 2015-06-04 DIAGNOSIS — R5383 Other fatigue: Secondary | ICD-10-CM

## 2015-06-04 DIAGNOSIS — I4892 Unspecified atrial flutter: Secondary | ICD-10-CM | POA: Diagnosis not present

## 2015-06-04 DIAGNOSIS — F329 Major depressive disorder, single episode, unspecified: Secondary | ICD-10-CM

## 2015-06-04 DIAGNOSIS — Z950 Presence of cardiac pacemaker: Secondary | ICD-10-CM | POA: Diagnosis not present

## 2015-06-04 DIAGNOSIS — F32A Depression, unspecified: Secondary | ICD-10-CM

## 2015-06-04 DIAGNOSIS — I10 Essential (primary) hypertension: Secondary | ICD-10-CM | POA: Diagnosis not present

## 2015-06-04 DIAGNOSIS — I7101 Dissection of thoracic aorta: Secondary | ICD-10-CM

## 2015-06-04 DIAGNOSIS — I71019 Dissection of thoracic aorta, unspecified: Secondary | ICD-10-CM

## 2015-06-04 MED ORDER — RIVAROXABAN 20 MG PO TABS: 20.0000 mg | ORAL_TABLET | Freq: Every day | ORAL | Status: DC

## 2015-06-04 NOTE — Patient Instructions (Signed)
Your physician recommends that you schedule a follow-up appointment with Dr. Sallyanne Kuster.

## 2015-06-04 NOTE — Progress Notes (Signed)
Cardiology Office Note   Date:  06/04/2015   ID:  [name], DOB Aug 29, 1946, MRN [id number]  PCP:  Garnet Koyanagi, DO  Cardiologist:  Dr. Bertrum Sol     Chief Complaint  Patient presents with  . Fatigue    no chest pain has swelling in feet, no SOB and no dizziness      History of Present Illness: [name] is a 69 y.o. female who presents for fatigue with increased stress due to home situation.   She is having a hard time with her husband [name]'s physical limitations and the demands this places on her.  Today she is tearful due to the constant physical and psychological stress of caring for her husband with numerous medical issues.  She has no chest pain and no SOB.    She has a history of sinus node dysfunction and has had atrial flutter that was pace-terminated via her dual chamber Medtronic pacemaker (implanted 2007, generator change 08/2013 for symptomatic bradycardia). She had surgical repair of a proximal aortic dissection with aortic valve resuspension in 2008, after having a type B dissection in 2000. She has normal LV systolic function. She sees Dr. Servando Snare every 2 years (last seen February 2015). She has type II DM and hypertriglyceridemia, last A1c 6.9% (Dr. Cruzita Lederer).  She takes furosemide on the average twice a week to control ankle edema.  She has some today but has not taken her lasix.  Last Echo 08/2014: Study Conclusions - Left ventricle: Cannot fully evaluate regional wall motion as endocardium is difficult to see. The cavity size was normal. Wall thickness was increased in a pattern of mild LVH. Systolic function was normal. The estimated ejection fraction was in the range of 55% to 60%. - Aortic valve: Poorly visualized. There was no stenosis. - Mitral valve: There was mild regurgitation. - Pulmonary arteries: PA peak pressure: 34 mm Hg (S).   Past Medical History  Diagnosis Date  . Anxiety   . Hyperlipidemia   . Hypertension   .  Diabetes mellitus   . GERD (gastroesophageal reflux disease)   . Cancer     skin  . Kidney stones   . Diverticulosis   . Hemorrhoids   . Acute thoracic aortic dissection 2008    emergency surgery - [facility]  . Sinus node dysfunction     hx of PPM    Past Surgical History  Procedure Laterality Date  . Pacemaker insertion  08/2006    dual chamber Medtronic EnRhythm; r/t sinus node dysfunction   . Appendectomy    . Abdominal hysterectomy  1984  . Thoracic aortic aneurysm repair  2000    type III  . Repair of acute ascending thoracic aortic dissection  2008    Dr. Servando Snare  . Transthoracic echocardiogram  09/20/2012    EF=>55% with mild conc LVH; LA mod dilated; RA mildly dilated; mild MR/TR/AR  . Pacemaker generator change  2014    Medtronic adapta  . Eye surgery  2014    Rght eye  . Pacemaker generator change N/A 08/29/2013    Procedure: PACEMAKER GENERATOR CHANGE;  Surgeon: Sanda Klein, MD;  Location: [facility] CATH [facility];  Service: Cardiovascular;  Laterality: N/A;     Current Outpatient Prescriptions  Medication Sig Dispense Refill  . ALPRAZolam (XANAX) 0.5 MG tablet TAKE 1 TABLET BY MOUTH 3 TIMES A DAY AS NEEDED FOR ANXIETY 90 tablet 0  . carvedilol (COREG) 25 MG tablet TAKE 1 TABLET BY MOUTH TWICE A  DAY 180 tablet 2  . cholecalciferol (VITAMIN D) 1000 UNITS tablet Take 1,000 Units by mouth daily.    . citalopram (CELEXA) 40 MG tablet Take 1 tablet (40 mg total) by mouth daily. 90 tablet 1  . fenofibrate 160 MG tablet TAKE 1 TABLET (160 MG TOTAL) BY MOUTH DAILY. 30 tablet 5  . fluticasone (FLONASE) 50 MCG/ACT nasal spray Place 2 sprays into both nostrils daily. 16 g 6  . furosemide (LASIX) 40 MG tablet TAKE 1 TABLET BY MOUTH EVERY DAY AS DIRECTED 90 tablet 3  . glipiZIDE (GLUCOTROL XL) 5 MG 24 hr tablet Take 2 tablets by mouth daily.    . hyoscyamine (LEVSIN SL) 0.125 MG SL tablet TAKE 1 TABLET BY MOUTH TWICE A DAY AS NEEDED FOR CRAMPING OR DIARRHEA OR LOOSE STOOLS 60 tablet 2    . lansoprazole (PREVACID) 15 MG capsule Take 15 mg by mouth as needed (for heart burn).     . rosuvastatin (CRESTOR) 20 MG tablet Take 1 tablet (20 mg total) by mouth at bedtime. 30 tablet 2  . sitaGLIPtin (JANUVIA) 100 MG tablet Take 1 tablet (100 mg total) by mouth daily. 30 tablet 5  . XARELTO 20 MG TABS tablet TAKE 1 TABLET BY MOUTH DAILY WITH SUPPER 30 tablet 5  . rivaroxaban (XARELTO) 20 MG TABS tablet Take 1 tablet (20 mg total) by mouth daily with supper. 20 tablet 0   No current facility-administered medications for this visit.    Allergies:   Oxycodone; Vicodin; Neomycin-bacitracin zn-polymyx; and Sulfonamide derivatives    Social History:  The patient  reports that she quit smoking about 21 years ago. She has never used smokeless tobacco. She reports that she does not drink alcohol or use illicit drugs.   Family History:  The patient's family history includes Heart attack in her father and maternal grandmother; Kidney disease in her mother; Valvular heart disease in her son. There is no history of Cancer.    ROS:  General:no colds or fevers, no weight changes, + fatigue Skin:no rashes or ulcers HEENT:no blurred vision, no congestion CV:see HPI PUL:see HPI GI:no diarrhea constipation or melena, no indigestion GU:no hematuria, no dysuria MS:no joint pain, no claudication Neuro:no syncope, no lightheadedness Endo:no diabetes, no thyroid disease  Wt Readings from Last 3 Encounters:  06/04/15 224 lb 6.4 oz (101.787 kg)  04/24/15 223 lb 12.8 oz (101.515 kg)  10/03/14 224 lb (101.606 kg)     PHYSICAL EXAM: VS:  BP 124/74 mmHg  Pulse 91  Ht 5\' 1"  (1.549 m)  Wt 224 lb 6.4 oz (101.787 kg)  BMI 42.42 kg/m2 , BMI Body mass index is 42.42 kg/(m^2). General:Pleasant affect, NAD Skin:Warm and dry, brisk capillary refill HEENT:normocephalic, sclera clear, mucus membranes moist Neck:supple, no JVD, + rt carotid bruit   Heart:S1S2 RRR with 3/6 systolic murmur, no gallup, rub or  click Lungs:clear without rales, rhonchi, or wheezes DJT:TSVXB, soft, non tender, + BS, do not palpate liver spleen or masses Ext:1+ lower ext edema, 2+ pedal pulses, 2+ radial pulses Neuro:alert and oriented X 3, MAE, follows commands, + facial symmetry Psyche: anxious with increased stress.    EKG:  EKG is ordered today. The ekg ordered today demonstrates atrial pacing with 2:1 Block and V pacing with block.  No acute changes.    Recent Labs: 12/19/2014: ALT 16    Lipid Panel    Component Value Date/Time   CHOL 153 12/19/2014 1421   TRIG 163.0* 12/19/2014 1421   HDL 53.10  12/19/2014 1421   CHOLHDL 3 12/19/2014 1421   VLDL 32.6 12/19/2014 1421   LDLCALC 67 12/19/2014 1421   LDLDIRECT 217.7 09/13/2011 1535       Other studies Reviewed: Additional studies/ records that were reviewed today include: labs, echo   ASSESSMENT AND PLAN:  Fatigue- no pain and stable EKG- I believe her level of stress of caring for her husband is increasing her fatigue.  She has rec'd xanax from her PCP.  We discussed her need to see a counselor on routine schedule.  She agreed and does have a Social worker.  I have asked Dr. Jerilynn Mages. Croitoru to encourage her husband to agree to outside help to give the pt a break in care giving.  Keep appt with Dr. Jerilynn Mages. Croitoru in August.  HTN Controlled  Hypertryglyceridemia In target range after improved glycemic control, on fenofibrate   Paroxysmal atrial flutter  Continue lifelong anticoagulation, No indication for antiarrhythmics at this point.   Pacemaker - Medtronic Adapta dual chamber2014, initial implantation 2007  Pacing appropriately.  Proximal dissection of the aorta status post graft repair and aortic valve resuspension Important to keep BP in low normal range, keep on high dose beta blocker. followed by Dr. Servando Snare.   Anxiety and depression Followed by PCP   Current medicines are reviewed with the patient today.  The patient Has no concerns  regarding medicines.  The following changes have been made:  See above Labs/ tests ordered today include:see above  Disposition:   FU:  see above  Signed, Isaiah Serge, NP  06/04/2015 5:21 PM    [facility] [place], [place], [place], [place] Phone: [phone]; Fax: [phone]

## 2015-06-12 ENCOUNTER — Telehealth: Payer: Self-pay | Admitting: Internal Medicine

## 2015-06-12 MED ORDER — GLIPIZIDE ER 5 MG PO TB24: 5.0000 mg | ORAL_TABLET | Freq: Every day | ORAL | Status: DC

## 2015-06-12 NOTE — Telephone Encounter (Signed)
Glipizide needs to be called in for refill [facility] to [facility] [address] [phone]

## 2015-06-14 ENCOUNTER — Other Ambulatory Visit: Payer: Self-pay | Admitting: Family Medicine

## 2015-06-16 NOTE — Telephone Encounter (Signed)
Last seen 10/03/14 and filled 05/14/15 #90 UDS 02/21/13 low risk   Please advise    KP

## 2015-07-11 ENCOUNTER — Encounter: Payer: Medicare Other | Admitting: Cardiovascular Disease

## 2015-07-17 ENCOUNTER — Other Ambulatory Visit: Payer: Self-pay | Admitting: Family Medicine

## 2015-07-17 NOTE — Telephone Encounter (Signed)
Last seen 10/03/14 and filled 06/16/15 #90   Please advise    KP

## 2015-07-23 ENCOUNTER — Telehealth: Payer: Self-pay | Admitting: Internal Medicine

## 2015-07-23 NOTE — Telephone Encounter (Signed)
Please read message below and advise.  

## 2015-07-23 NOTE — Telephone Encounter (Signed)
Okay 

## 2015-07-23 NOTE — Telephone Encounter (Signed)
Had to cancel upcoming appt do to her husband's illness but her bs are doing very well now

## 2015-07-25 ENCOUNTER — Ambulatory Visit: Payer: Medicare Other | Admitting: Internal Medicine

## 2015-07-27 ENCOUNTER — Other Ambulatory Visit: Payer: Self-pay | Admitting: Cardiovascular Disease

## 2015-07-27 ENCOUNTER — Other Ambulatory Visit: Payer: Self-pay | Admitting: Family Medicine

## 2015-07-28 NOTE — Telephone Encounter (Signed)
REFILL 

## 2015-08-04 ENCOUNTER — Other Ambulatory Visit: Payer: Self-pay | Admitting: Internal Medicine

## 2015-08-04 ENCOUNTER — Other Ambulatory Visit: Payer: Self-pay | Admitting: Family Medicine

## 2015-08-05 NOTE — Telephone Encounter (Signed)
Please schedule this patient a CPE/Fasting. She is over due and forward back to me once scheduled.      KP

## 2015-08-07 ENCOUNTER — Other Ambulatory Visit: Payer: Self-pay

## 2015-08-07 MED ORDER — ROSUVASTATIN CALCIUM 20 MG PO TABS: 20.0000 mg | ORAL_TABLET | Freq: Every day | ORAL | Status: DC

## 2015-08-15 NOTE — Telephone Encounter (Signed)
Patient will call back to schedule physical appointment

## 2015-08-18 ENCOUNTER — Other Ambulatory Visit: Payer: Self-pay | Admitting: Family Medicine

## 2015-08-18 NOTE — Telephone Encounter (Signed)
Last seen 10/03/14 and filled 07/17/15 #90   Please advise     KP

## 2015-08-19 MED ORDER — ALPRAZOLAM 0.5 MG PO TABS: ORAL_TABLET | ORAL | Status: DC

## 2015-08-19 NOTE — Addendum Note (Signed)
Addended by: Ewing Schlein on: 08/19/2015 08:10 AM   Modules accepted: Orders

## 2015-08-19 NOTE — Telephone Encounter (Signed)
Rx will be faxed once signed.     KP

## 2015-09-10 ENCOUNTER — Other Ambulatory Visit: Payer: Self-pay | Admitting: Family Medicine

## 2015-09-10 ENCOUNTER — Other Ambulatory Visit: Payer: Self-pay | Admitting: Internal Medicine

## 2015-09-11 ENCOUNTER — Encounter: Payer: Self-pay | Admitting: Family Medicine

## 2015-09-16 ENCOUNTER — Other Ambulatory Visit: Payer: Self-pay | Admitting: Family Medicine

## 2015-09-16 NOTE — Telephone Encounter (Signed)
Last seen 10/03/14 and filled 08/19/15 #90  Please advise     KP

## 2015-09-23 ENCOUNTER — Other Ambulatory Visit: Payer: Self-pay | Admitting: Cardiovascular Disease

## 2015-09-23 ENCOUNTER — Telehealth: Payer: Self-pay | Admitting: Family Medicine

## 2015-09-23 ENCOUNTER — Ambulatory Visit: Payer: Medicare Other | Admitting: Family Medicine

## 2015-09-23 NOTE — Telephone Encounter (Signed)
No charge. 

## 2015-09-23 NOTE — Telephone Encounter (Signed)
Caller name: Ahlin,Latonga Relation to PZ:WCHE Call back number: [phone]  Reason for call:  Patient called and states they were up all night due to spouse having diarrhea and she is "sorry" and asked for MD forgiveness.

## 2015-09-24 ENCOUNTER — Other Ambulatory Visit: Payer: Self-pay | Admitting: Family Medicine

## 2015-10-06 ENCOUNTER — Other Ambulatory Visit: Payer: Self-pay | Admitting: Family Medicine

## 2015-10-07 ENCOUNTER — Ambulatory Visit: Payer: Medicare Other | Admitting: Cardiovascular Disease

## 2015-10-08 DIAGNOSIS — E119 Type 2 diabetes mellitus without complications: Secondary | ICD-10-CM | POA: Diagnosis not present

## 2015-10-08 DIAGNOSIS — H2513 Age-related nuclear cataract, bilateral: Secondary | ICD-10-CM | POA: Diagnosis not present

## 2015-10-08 DIAGNOSIS — H04123 Dry eye syndrome of bilateral lacrimal glands: Secondary | ICD-10-CM | POA: Diagnosis not present

## 2015-10-08 DIAGNOSIS — H01001 Unspecified blepharitis right upper eyelid: Secondary | ICD-10-CM | POA: Diagnosis not present

## 2015-10-08 LAB — HM DIABETES EYE EXAM

## 2015-10-09 ENCOUNTER — Telehealth: Payer: Self-pay | Admitting: Family Medicine

## 2015-10-09 NOTE — Telephone Encounter (Signed)
Relation to [id number] Call back number:[phone]   Reason for call:  Patient would like to know if she is up to date regarding pneumonia and shingles vaccination and if she is not would like to receive at follow up appointment on 10/14/15. Please advise

## 2015-10-09 NOTE — Telephone Encounter (Signed)
Patient informed. 

## 2015-10-09 NOTE — Telephone Encounter (Signed)
She can have prevnar

## 2015-10-09 NOTE — Telephone Encounter (Signed)
Shingles already complete. Please advise on Prevnar.     KP

## 2015-10-10 ENCOUNTER — Encounter: Payer: Self-pay | Admitting: Cardiovascular Disease

## 2015-10-10 ENCOUNTER — Telehealth: Payer: Self-pay | Admitting: Cardiovascular Disease

## 2015-10-10 ENCOUNTER — Other Ambulatory Visit: Payer: Self-pay

## 2015-10-10 ENCOUNTER — Other Ambulatory Visit: Payer: Self-pay | Admitting: Family Medicine

## 2015-10-10 ENCOUNTER — Ambulatory Visit (INDEPENDENT_AMBULATORY_CARE_PROVIDER_SITE_OTHER): Payer: Medicare Other | Admitting: Cardiovascular Disease

## 2015-10-10 VITALS — BP 131/71 | HR 77 | Ht 60.0 in | Wt 223.6 lb

## 2015-10-10 DIAGNOSIS — I495 Sick sinus syndrome: Secondary | ICD-10-CM | POA: Diagnosis not present

## 2015-10-10 DIAGNOSIS — Z950 Presence of cardiac pacemaker: Secondary | ICD-10-CM | POA: Diagnosis not present

## 2015-10-10 DIAGNOSIS — E785 Hyperlipidemia, unspecified: Secondary | ICD-10-CM

## 2015-10-10 DIAGNOSIS — I10 Essential (primary) hypertension: Secondary | ICD-10-CM

## 2015-10-10 DIAGNOSIS — I4892 Unspecified atrial flutter: Secondary | ICD-10-CM | POA: Diagnosis not present

## 2015-10-10 DIAGNOSIS — E669 Obesity, unspecified: Secondary | ICD-10-CM

## 2015-10-10 DIAGNOSIS — I7101 Dissection of thoracic aorta: Secondary | ICD-10-CM

## 2015-10-10 DIAGNOSIS — I71019 Dissection of thoracic aorta, unspecified: Secondary | ICD-10-CM

## 2015-10-10 MED ORDER — CARVEDILOL 25 MG PO TABS
25.0000 mg | ORAL_TABLET | Freq: Two times a day (BID) | ORAL | Status: DC
Start: 2015-10-10 — End: 2016-01-06

## 2015-10-10 MED ORDER — CARVEDILOL 25 MG PO TABS: ORAL_TABLET | ORAL | Status: DC

## 2015-10-10 NOTE — Telephone Encounter (Signed)
Received call from pharmacy tech at [facility] calling to verify coreg directions.Advised coreg 25 mg 1/2 tablet in am and 25 mg 1 tablet in pm.

## 2015-10-10 NOTE — Patient Instructions (Signed)
Your physician has recommended you make the following change in your medication: DECREASE CARVEDILOL TO 1/2 TABLET IN AM AND CONTINUE 1 TABLET IN THE PM.  Remote monitoring is used to monitor your Pacemaker from home. This monitoring reduces the number of office visits required to check your device to one time per year. It allows [name] to monitor the functioning of your device to ensure it is working properly. You are scheduled for a device check from home on January 11, 2016. You may send your transmission at any time that day. If you have a wireless device, the transmission will be sent automatically. After your physician reviews your transmission, you will receive a postcard with your next transmission date.   Dr. Sallyanne Kuster recommends that you schedule a follow-up appointment in: 6 MONTHS

## 2015-10-10 NOTE — Progress Notes (Signed)
Patient ID: [name], female   DOB: 27-Jul-1946, 69 y.o.   MRN: [id number]     Cardiology Office Note   Date:  10/11/2015   ID:  STEPHINIE JOLLIE, DOB 1946-11-29, MRN [id number]  PCP:  Garnet Koyanagi, DO  Cardiologist:   Sanda Klein, MD   Chief Complaint  Patient presents with  . Follow-up    no chest pain, no shortness of breath, occassional edema, no pain in legs, no cramping in legs, occassional lightheadedness, occassional dizziness      History of Present Illness: [name] is a 69 y.o. female who presents for  Follow-up of sinus node dysfunction and pacemaker check , systemic hypertension , hyperlipidemia , chronic type A aortic dissection status post ascending aortic repair.   she is doing better than at her last appointment , since she has managed to get a little more rest with outside assistance for care of her husband [name]. Nevertheless, she complains of overwhelming fatigue at midday and the need to take a nap. She attributes this to her beta blocker.  She denies chest pain, exertional dyspnea, edema , claudication. She has occasional leg cramps at night. She has occasional dizziness no syncope.  Her dual-chamber permanent pacemaker (leads implanted 2007, new generator 2014, Medtronic Adapta) as an estimated generator longevity of 12 years. There is virtually 100% atrial pacing with good heart rate histogram distribution. She has only about 4% ventricular pacing. No new episodes of atrial mode switch have been recorded although she has had atrial flutter in the past.  Past Medical History  Diagnosis Date  . Anxiety   . Hyperlipidemia   . Hypertension   . Diabetes mellitus   . GERD (gastroesophageal reflux disease)   . Cancer ([place])     skin  . Kidney stones   . Diverticulosis   . Hemorrhoids   . Acute thoracic aortic dissection [facility]) 2008    emergency surgery - Gerhardt  . Sinus node dysfunction ([facility])     hx of PPM    Past Surgical History  Procedure  Laterality Date  . Pacemaker insertion  08/2006    dual chamber Medtronic EnRhythm; r/t sinus node dysfunction   . Appendectomy    . Abdominal hysterectomy  1984  . Thoracic aortic aneurysm repair  2000    type III  . Repair of acute ascending thoracic aortic dissection  2008    Dr. Servando Snare  . Transthoracic echocardiogram  09/20/2012    EF=>55% with mild conc LVH; LA mod dilated; RA mildly dilated; mild MR/TR/AR  . Pacemaker generator change  2014    Medtronic adapta  . Eye surgery  2014    Rght eye  . Pacemaker generator change N/A 08/29/2013    Procedure: PACEMAKER GENERATOR CHANGE;  Surgeon: Sanda Klein, MD;  Location: [facility] CATH LAB;  Service: Cardiovascular;  Laterality: N/A;     Current Outpatient Prescriptions  Medication Sig Dispense Refill  . ALPRAZolam (XANAX) 0.5 MG tablet TAKE 1 TABLET BY MOUTH 3 TIMES A DAY AS NEEDED FOR ANXIETY 90 tablet 0  . cholecalciferol (VITAMIN D) 1000 UNITS tablet Take 1,000 Units by mouth daily.    . citalopram (CELEXA) 40 MG tablet Take 1 tablet (40 mg total) by mouth daily. Office visit due now 90 tablet 0  . fenofibrate 160 MG tablet TAKE 1 TABLET (160 MG TOTAL) BY MOUTH DAILY. 30 tablet 5  . fluticasone (FLONASE) 50 MCG/ACT nasal spray Place 2 sprays into both nostrils daily. 16 g  6  . furosemide (LASIX) 40 MG tablet Take 1 tablet (40 mg total) by mouth daily. Please schedule your follow up appointment for more refills. 90 tablet 0  . glipiZIDE (GLUCOTROL XL) 5 MG 24 hr tablet TAKE 1/2 TABLET BY MOUTH DAILY. 30 tablet 2  . hyoscyamine (LEVSIN SL) 0.125 MG SL tablet TAKE 1 TABLET BY MOUTH TWICE A DAY AS NEEDED FOR CRAMPING OR DIARRHEA OR LOOSE STOOLS 60 tablet 2  . JANUVIA 100 MG tablet TAKE 1 TABLET (100 MG TOTAL) BY MOUTH DAILY. 30 tablet 2  . lansoprazole (PREVACID) 15 MG capsule Take 15 mg by mouth as needed (for heart burn).     [name] Glycol-Propyl Glycol (SYSTANE OP) Apply 1 drop to eye 4 (four) times daily - after meals and at  bedtime.    . Probiotic Product (PRO-BIOTIC BLEND PO) Take 1 tablet by mouth daily.    . rivaroxaban (XARELTO) 20 MG TABS tablet Take 1 tablet (20 mg total) by mouth daily with supper. 20 tablet 0  . rosuvastatin (CRESTOR) 20 MG tablet TAKE 1 TABLET BY MOUTH AT BEDTIME (OFFICE VISIT AND LABS ARE DUE NOW!!) 15 tablet 0  . XARELTO 20 MG TABS tablet TAKE 1 TABLET BY MOUTH DAILY WITH SUPPER 30 tablet 5  . carvedilol (COREG) 25 MG tablet Take 25 mg 1/2 tablet in morning and 25 mg 1 tablet in pm 60 tablet 6   No current facility-administered medications for this visit.    Allergies:   Oxycodone; Vicodin; Neomycin-bacitracin zn-polymyx; and Sulfonamide derivatives    Social History:  The patient  reports that she quit smoking about 22 years ago. She has never used smokeless tobacco. She reports that she does not drink alcohol or use illicit drugs.   Family History:  The patient's family history includes Heart attack in her father and maternal grandmother; Kidney disease in her mother; Valvular heart disease in her son. There is no history of Cancer.    ROS:  Please see the history of present illness.    Otherwise, review of systems positive for none.   All other systems are reviewed and negative.    PHYSICAL EXAM: VS:  BP 131/71 mmHg  Pulse 77  Ht 5' (1.524 m)  Wt 223 lb 9 oz (101.407 kg)  BMI 43.66 kg/m2 , BMI Body mass index is 43.66 kg/(m^2).  General: Alert, oriented x3, no distress Head: no evidence of trauma, PERRL, EOMI, no exophtalmos or lid lag, no myxedema, no xanthelasma; normal ears, nose and oropharynx Neck: normal jugular venous pulsations and no hepatojugular reflux; brisk carotid pulses without delay and no carotid bruits Chest: clear to auscultation, no signs of consolidation by percussion or palpation, normal fremitus, symmetrical and full respiratory excursions Cardiovascular: normal position and quality of the apical impulse, regular rhythm, normal first and second  heart sounds, no murmurs, rubs or gallops Abdomen: no tenderness or distention, no masses by palpation, no abnormal pulsatility or arterial bruits, normal bowel sounds, no hepatosplenomegaly Extremities: no clubbing, cyanosis or edema; 2+ radial, ulnar and brachial pulses bilaterally; 2+ right femoral, posterior tibial and dorsalis pedis pulses; 2+ left femoral, posterior tibial and dorsalis pedis pulses; no subclavian or femoral bruits Neurological: grossly nonfocal Psych: euthymic mood, full affect   EKG:  EKG is not ordered today.   Recent Labs: 12/19/2014: ALT 16    Lipid Panel    Component Value Date/Time   CHOL 153 12/19/2014 1421   TRIG 163.0* 12/19/2014 1421   HDL 53.10  12/19/2014 1421   CHOLHDL 3 12/19/2014 1421   VLDL 32.6 12/19/2014 1421   LDLCALC 67 12/19/2014 1421   LDLDIRECT 217.7 09/13/2011 1535      Wt Readings from Last 3 Encounters:  10/10/15 223 lb 9 oz (101.407 kg)  06/04/15 224 lb 6.4 oz (101.787 kg)  04/24/15 223 lb 12.8 oz (101.515 kg)     ASSESSMENT AND PLAN:  1.  Sinus node dysfunction, normally functioning dual-chamber permanent pacemaker 2.  Essential hypertension well controlled. She understands the need for beta blocker therapy in view of the history of aortic dissection, but I think it is reasonable to reduce the daytime dose of carvedilol to improve her symptoms are fatigue. 3.  History of paroxysmal atrial flutter recorded by her device without recent recurrence. On appropriate anticoagulation without bleeding problems. 4.  History of type A or dissection status post ascending aorta repair. She has an appointment scheduled with Dr. Servando Snare in February when repeat imaging will be performed. 5.  Hyperlipidemia with LDL at target on recent laboratory tests 6.  Obesity and type 2 diabetes mellitus.  Weight loss is again recommended.  Current medicines are reviewed at length with the patient today.  The patient has concerns regarding  medicines.  The following changes have been made:   Decrease a.m. Dose of carvedilol in half  Labs/ tests ordered today include:  No orders of the defined types were placed in this encounter.     Patient Instructions  Your physician has recommended you make the following change in your medication: DECREASE CARVEDILOL TO 1/2 TABLET IN AM AND CONTINUE 1 TABLET IN THE PM.  Remote monitoring is used to monitor your Pacemaker from home. This monitoring reduces the number of office visits required to check your device to one time per year. It allows [name] to monitor the functioning of your device to ensure it is working properly. You are scheduled for a device check from home on January 11, 2016. You may send your transmission at any time that day. If you have a wireless device, the transmission will be sent automatically. After your physician reviews your transmission, you will receive a postcard with your next transmission date.   Dr. Sallyanne Kuster recommends that you schedule a follow-up appointment in: [place], Ajeet Casasola, MD  10/11/2015 11:05 AM    Sanda Klein, MD, [facility] [phone] office [phone] pager

## 2015-10-11 DIAGNOSIS — I495 Sick sinus syndrome: Secondary | ICD-10-CM | POA: Insufficient documentation

## 2015-10-13 ENCOUNTER — Telehealth: Payer: Self-pay | Admitting: Internal Medicine

## 2015-10-13 MED ORDER — GLIPIZIDE ER 5 MG PO TB24: ORAL_TABLET | ORAL | Status: DC

## 2015-10-13 NOTE — Telephone Encounter (Signed)
Patient stated that she has been taking her glipizide 5 mg, when she she was suppose to be taking 1/2 the pill, but the 5 mg  Is working really great, can she continue taking it. And she is totally out of the glipizide she need a refill today

## 2015-10-13 NOTE — Telephone Encounter (Signed)
Refill sent to pt's pharmacy. 

## 2015-10-13 NOTE — Telephone Encounter (Signed)
OK to refill 5 mg

## 2015-10-13 NOTE — Telephone Encounter (Signed)
Please read message below and advise.  

## 2015-10-14 ENCOUNTER — Ambulatory Visit (INDEPENDENT_AMBULATORY_CARE_PROVIDER_SITE_OTHER): Payer: Medicare Other | Admitting: Family Medicine

## 2015-10-14 ENCOUNTER — Encounter: Payer: Self-pay | Admitting: Family Medicine

## 2015-10-14 VITALS — BP 106/64 | HR 85 | Temp 98.5°F | Ht 60.0 in | Wt 223.0 lb

## 2015-10-14 DIAGNOSIS — E785 Hyperlipidemia, unspecified: Secondary | ICD-10-CM | POA: Diagnosis not present

## 2015-10-14 DIAGNOSIS — Z23 Encounter for immunization: Secondary | ICD-10-CM

## 2015-10-14 DIAGNOSIS — F419 Anxiety disorder, unspecified: Secondary | ICD-10-CM

## 2015-10-14 LAB — COMPREHENSIVE METABOLIC PANEL
ALT: 21 U/L (ref 0–35)
AST: 23 U/L (ref 0–37)
Albumin: 4.2 g/dL (ref 3.5–5.2)
Alkaline Phosphatase: 40 U/L (ref 39–117)
BUN: 17 mg/dL (ref 6–23)
CALCIUM: 9.3 mg/dL (ref 8.4–10.5)
CO2: 26 mEq/L (ref 19–32)
Chloride: 106 mEq/L (ref 96–112)
Creatinine, Ser: 0.79 mg/dL (ref 0.40–1.20)
GFR: 76.69 mL/min (ref >60.00)
GLUCOSE: 159 mg/dL — AB (ref 70–99)
POTASSIUM: 4.5 meq/L (ref 3.5–5.1)
Sodium: 140 mEq/L (ref 135–145)
TOTAL PROTEIN: 7 g/dL (ref 6.0–8.3)
Total Bilirubin: 0.5 mg/dL (ref 0.2–1.2)

## 2015-10-14 LAB — LIPID PANEL
CHOL/HDL RATIO: 3
Cholesterol: 169 mg/dL (ref 0–200)
HDL: 55 mg/dL (ref >39.00)
LDL CALC: 90 mg/dL (ref 0–99)
NonHDL: 113.59
TRIGLYCERIDES: 119 mg/dL (ref 0.0–149.0)
VLDL: 23.8 mg/dL (ref 0.0–40.0)

## 2015-10-14 MED ORDER — ALPRAZOLAM 0.5 MG PO TABS: ORAL_TABLET | ORAL | Status: DC

## 2015-10-14 MED ORDER — CITALOPRAM HYDROBROMIDE 40 MG PO TABS: 40.0000 mg | ORAL_TABLET | Freq: Every day | ORAL | Status: DC

## 2015-10-14 MED ORDER — FENOFIBRATE 160 MG PO TABS: ORAL_TABLET | ORAL | Status: DC

## 2015-10-14 NOTE — Progress Notes (Signed)
Pre visit review using our clinic review tool, if applicable. No additional management support is needed unless otherwise documented below in the visit note. 

## 2015-10-14 NOTE — Progress Notes (Signed)
Patient ID: [name], female    DOB: [birth year]  Age: 69 y.o. MRN: [id number]    Subjective:  Subjective HPI [name] presents for f/u cholesterol , anxiety  Review of Systems  Constitutional: Negative for diaphoresis, appetite change, fatigue and unexpected weight change.  Eyes: Negative for pain, redness and visual disturbance.  Respiratory: Negative for cough, chest tightness, shortness of breath and wheezing.   Cardiovascular: Negative for chest pain, palpitations and leg swelling.  Endocrine: Negative for cold intolerance, heat intolerance, polydipsia, polyphagia and polyuria.  Genitourinary: Negative for dysuria, frequency and difficulty urinating.  Neurological: Negative for dizziness, light-headedness, numbness and headaches.    History Past Medical History  Diagnosis Date  . Anxiety   . Hyperlipidemia   . Hypertension   . Diabetes mellitus   . GERD (gastroesophageal reflux disease)   . Cancer ([place])     skin  . Kidney stones   . Diverticulosis   . Hemorrhoids   . Acute thoracic aortic dissection [facility]) 2008    emergency surgery - [facility]  . Sinus node dysfunction (H[facility])     hx of PPM    She has past surgical history that includes Pacemaker insertion (08/2006); Appendectomy; Abdominal hysterectomy (1984); Thoracic aortic aneurysm repair (2000); Repair of acute ascending thoracic aortic dissection (2008); transthoracic echocardiogram (09/20/2012); Pacemaker generator change (2014); Eye surgery (2014); and pacemaker generator change (N/A, 08/29/2013).   Her family history includes Heart attack in her father and maternal grandmother; Kidney disease in her mother; Valvular heart disease in her son. There is no history of Cancer.She reports that she quit smoking about 22 years ago. She has never used smokeless tobacco. She reports that she does not drink alcohol or use illicit drugs.  Current Outpatient Prescriptions on File Prior to Visit  Medication Sig  Dispense Refill  . carvedilol (COREG) 25 MG tablet Take 25 mg 1/2 tablet in morning and 25 mg 1 tablet in pm 60 tablet 6  . cholecalciferol (VITAMIN D) 1000 UNITS tablet Take 1,000 Units by mouth daily.    . fluticasone (FLONASE) 50 MCG/ACT nasal spray Place 2 sprays into both nostrils daily. 16 g 6  . furosemide (LASIX) 40 MG tablet Take 1 tablet (40 mg total) by mouth daily. Please schedule your follow up appointment for more refills. 90 tablet 0  . glipiZIDE (GLUCOTROL XL) 5 MG 24 hr tablet TAKE 1 TABLET BY MOUTH DAILY. 30 tablet 2  . hyoscyamine (LEVSIN SL) 0.125 MG SL tablet TAKE 1 TABLET BY MOUTH TWICE A DAY AS NEEDED FOR CRAMPING OR DIARRHEA OR LOOSE STOOLS 60 tablet 2  . JANUVIA 100 MG tablet TAKE 1 TABLET (100 MG TOTAL) BY MOUTH DAILY. 30 tablet 2  . lansoprazole (PREVACID) 15 MG capsule Take 15 mg by mouth as needed (for heart burn).     Vladimir Faster Glycol-Propyl Glycol (SYSTANE OP) Apply 1 drop to eye 4 (four) times daily - after meals and at bedtime.    . Probiotic Product (PRO-BIOTIC BLEND PO) Take 1 tablet by mouth daily.    . rosuvastatin (CRESTOR) 20 MG tablet TAKE 1 TABLET BY MOUTH AT BEDTIME (OFFICE VISIT AND LABS ARE DUE NOW!!) 15 tablet 0  . XARELTO 20 MG TABS tablet TAKE 1 TABLET BY MOUTH DAILY WITH SUPPER 30 tablet 5   No current facility-administered medications on file prior to visit.     Objective:  Objective Physical Exam  Constitutional: She is oriented to person, place, and time. She appears well-developed  and well-nourished.  HENT:  Head: Normocephalic and atraumatic.  Eyes: Conjunctivae and EOM are normal.  Neck: Normal range of motion. Neck supple. No JVD present. Carotid bruit is not present. No thyromegaly present.  Cardiovascular: Normal rate and regular rhythm.   Murmur heard. Pulmonary/Chest: Effort normal and breath sounds normal. No respiratory distress. She has no wheezes. She has no rales. She exhibits no tenderness.  Musculoskeletal: She exhibits  no edema.  Neurological: She is alert and oriented to person, place, and time.  Psychiatric: She has a normal mood and affect. Her behavior is normal.   BP 106/64 mmHg  Pulse 85  Temp(Src) 98.5 F (36.9 C) (Oral)  Ht 5' (1.524 m)  Wt 223 lb (101.152 kg)  BMI 43.55 kg/m2  SpO2 98% Wt Readings from Last 3 Encounters:  10/14/15 223 lb (101.152 kg)  10/10/15 223 lb 9 oz (101.407 kg)  06/04/15 224 lb 6.4 oz (101.787 kg)     Lab Results  Component Value Date   WBC 6.8 08/22/2013   HGB 14.2 08/22/2013   HCT 41.7 08/22/2013   PLT 228 08/22/2013   GLUCOSE 159* 10/14/2015   CHOL 169 10/14/2015   TRIG 119.0 10/14/2015   HDL 55.00 10/14/2015   LDLDIRECT 217.7 09/13/2011   LDLCALC 90 10/14/2015   ALT 21 10/14/2015   AST 23 10/14/2015   NA 140 10/14/2015   K 4.5 10/14/2015   CL 106 10/14/2015   CREATININE 0.79 10/14/2015   BUN 17 10/14/2015   CO2 26 10/14/2015   TSH 0.986 08/22/2013   INR 0.97 08/22/2013   HGBA1C 6.8* 04/24/2015   MICROALBUR 1.6 01/21/2014    No results found.   Assessment & Plan:  Plan I have discontinued [name]'s rivaroxaban. I am also having her maintain her lansoprazole, cholecalciferol, fluticasone, hyoscyamine, XARELTO, JANUVIA, furosemide, rosuvastatin, Probiotic Product (PRO-BIOTIC BLEND PO), Polyethyl Glycol-Propyl Glycol (SYSTANE OP), carvedilol, glipiZIDE, citalopram, ALPRAZolam, and fenofibrate.  Meds ordered this encounter  Medications  . citalopram (CELEXA) 40 MG tablet    Sig: Take 1 tablet (40 mg total) by mouth daily. Office visit due now    Dispense:  90 tablet    Refill:  0  . ALPRAZolam (XANAX) 0.5 MG tablet    Sig: TAKE 1 TABLET BY MOUTH 3 TIMES A DAY AS NEEDED FOR ANXIETY    Dispense:  90 tablet    Refill:  0    Not to exceed 5 additional fills before 02/15/2016  . fenofibrate 160 MG tablet    Sig: TAKE 1 TABLET (160 MG TOTAL) BY MOUTH DAILY.    Dispense:  30 tablet    Refill:  0    Problem List Items Addressed This  Visit    Hyperlipidemia LDL goal <100    crestor and fenofibrate Check labs      Relevant Medications   fenofibrate 160 MG tablet   Hyperlipidemia   Relevant Medications   fenofibrate 160 MG tablet   Other Relevant Orders   Comp Met (CMET) (Completed)   Lipid panel (Completed)    Other Visit Diagnoses    Anxiety disorder, unspecified anxiety disorder type    -  Primary    Relevant Medications    citalopram (CELEXA) 40 MG tablet    ALPRAZolam (XANAX) 0.5 MG tablet    Need for prophylactic vaccination against Streptococcus pneumoniae (pneumococcus)        Relevant Orders    Pneumococcal conjugate vaccine 13-valent (Completed)    Need for influenza vaccination  Relevant Orders    Flu vaccine HIGH DOSE PF (Fluzone High dose) (Completed)       Follow-up: Return in about 6 months (around 04/12/2016), or if symptoms worsen or fail to improve, for annual exam, fasting.  Garnet Koyanagi, DO

## 2015-10-14 NOTE — Patient Instructions (Signed)
Generalized Anxiety Disorder Generalized anxiety disorder (GAD) is a mental disorder. It interferes with life functions, including relationships, work, and school. GAD is different from normal anxiety, which everyone experiences at some point in their lives in response to specific life events and activities. Normal anxiety actually helps us prepare for and get through these life events and activities. Normal anxiety goes away after the event or activity is over.  GAD causes anxiety that is not necessarily related to specific events or activities. It also causes excess anxiety in proportion to specific events or activities. The anxiety associated with GAD is also difficult to control. GAD can vary from mild to severe. People with severe GAD can have intense waves of anxiety with physical symptoms (panic attacks).  SYMPTOMS The anxiety and worry associated with GAD are difficult to control. This anxiety and worry are related to many life events and activities and also occur more days than not for 6 months or longer. People with GAD also have three or more of the following symptoms (one or more in children):  Restlessness.   Fatigue.  Difficulty concentrating.   Irritability.  Muscle tension.  Difficulty sleeping or unsatisfying sleep. DIAGNOSIS GAD is diagnosed through an assessment by your health care provider. Your health care provider will ask you questions aboutyour mood,physical symptoms, and events in your life. Your health care provider may ask you about your medical history and use of alcohol or drugs, including prescription medicines. Your health care provider may also do a physical exam and blood tests. Certain medical conditions and the use of certain substances can cause symptoms similar to those associated with GAD. Your health care provider may refer you to a mental health specialist for further evaluation. TREATMENT The following therapies are usually used to treat GAD:    Medication. Antidepressant medication usually is prescribed for long-term daily control. Antianxiety medicines may be added in severe cases, especially when panic attacks occur.   Talk therapy (psychotherapy). Certain types of talk therapy can be helpful in treating GAD by providing support, education, and guidance. A form of talk therapy called cognitive behavioral therapy can teach you healthy ways to think about and react to daily life events and activities.  Stress managementtechniques. These include yoga, meditation, and exercise and can be very helpful when they are practiced regularly. A mental health specialist can help determine which treatment is best for you. Some people see improvement with one therapy. However, other people require a combination of therapies.   This information is not intended to replace advice given to you by your health care provider. Make sure you discuss any questions you have with your health care provider.   Document Released: 03/12/2013 Document Revised: 12/06/2014 Document Reviewed: 03/12/2013 Elsevier Interactive Patient Education 2016 [employer] Inc.  

## 2015-10-14 NOTE — Assessment & Plan Note (Signed)
crestor and fenofibrate Check labs

## 2015-10-29 ENCOUNTER — Other Ambulatory Visit: Payer: Self-pay | Admitting: Internal Medicine

## 2015-10-31 ENCOUNTER — Other Ambulatory Visit: Payer: Self-pay | Admitting: Family Medicine

## 2015-10-31 ENCOUNTER — Other Ambulatory Visit: Payer: Self-pay | Admitting: Cardiovascular Disease

## 2015-10-31 NOTE — Telephone Encounter (Signed)
Rx request sent to pharmacy.  

## 2015-11-03 NOTE — Telephone Encounter (Signed)
Medication filled to pharmacy as requested.   

## 2015-11-04 LAB — CUP PACEART INCLINIC DEVICE CHECK
Battery Impedance: 134 Ohm
Battery Remaining Longevity: 143 mo
Battery Voltage: 2.79 V
Brady Statistic AP VP Percent: 4 %
Date Time Interrogation Session: 20161111155717
Implantable Lead Implant Date: 20071016
Implantable Lead Location: 753859
Implantable Lead Model: 4092
Lead Channel Setting Sensing Sensitivity: 5.6 mV
MDC IDC LEAD IMPLANT DT: 20071016
MDC IDC LEAD LOCATION: 753860
MDC IDC MSMT LEADCHNL RA IMPEDANCE VALUE: 410 Ohm
MDC IDC MSMT LEADCHNL RV IMPEDANCE VALUE: 767 Ohm
MDC IDC SET LEADCHNL RA PACING AMPLITUDE: 1.5 V
MDC IDC SET LEADCHNL RV PACING AMPLITUDE: 2 V
MDC IDC SET LEADCHNL RV PACING PULSEWIDTH: 0.4 ms
MDC IDC STAT BRADY AP VS PERCENT: 96 %
MDC IDC STAT BRADY AS VP PERCENT: 0 %
MDC IDC STAT BRADY AS VS PERCENT: 0 %

## 2015-11-05 ENCOUNTER — Encounter: Payer: Self-pay | Admitting: Internal Medicine

## 2015-11-09 ENCOUNTER — Other Ambulatory Visit: Payer: Self-pay | Admitting: Cardiovascular Disease

## 2015-11-10 NOTE — Telephone Encounter (Signed)
Rx request sent to pharmacy.  

## 2015-11-15 ENCOUNTER — Other Ambulatory Visit: Payer: Self-pay | Admitting: Family Medicine

## 2015-11-17 NOTE — Telephone Encounter (Signed)
Last seen and filled 10/14/15 #90   Please advise     KP

## 2015-11-26 ENCOUNTER — Encounter: Payer: Self-pay | Admitting: *Deleted

## 2015-12-09 ENCOUNTER — Ambulatory Visit: Payer: Medicare Other | Admitting: Internal Medicine

## 2015-12-12 ENCOUNTER — Other Ambulatory Visit: Payer: Self-pay | Admitting: *Deleted

## 2015-12-12 DIAGNOSIS — I7101 Dissection of thoracic aorta: Secondary | ICD-10-CM

## 2015-12-12 DIAGNOSIS — I71019 Dissection of thoracic aorta, unspecified: Secondary | ICD-10-CM

## 2015-12-18 ENCOUNTER — Other Ambulatory Visit: Payer: Self-pay | Admitting: *Deleted

## 2015-12-18 DIAGNOSIS — I71019 Dissection of thoracic aorta, unspecified: Secondary | ICD-10-CM

## 2015-12-18 DIAGNOSIS — I7101 Dissection of thoracic aorta: Secondary | ICD-10-CM

## 2015-12-19 ENCOUNTER — Other Ambulatory Visit: Payer: Self-pay | Admitting: Cardiovascular Disease

## 2015-12-19 ENCOUNTER — Other Ambulatory Visit: Payer: Self-pay

## 2015-12-19 MED ORDER — ALPRAZOLAM 0.5 MG PO TABS: ORAL_TABLET | ORAL | Status: DC

## 2015-12-19 NOTE — Telephone Encounter (Signed)
Last seen 10/14/15 and filled 11/17/15 #90   Please advise     KP

## 2015-12-19 NOTE — Telephone Encounter (Signed)
Rx(s) sent to pharmacy electronically.  

## 2015-12-21 ENCOUNTER — Other Ambulatory Visit: Payer: Self-pay | Admitting: Family Medicine

## 2015-12-21 ENCOUNTER — Other Ambulatory Visit: Payer: Self-pay | Admitting: Cardiovascular Disease

## 2015-12-22 NOTE — Telephone Encounter (Signed)
Rx request sent to pharmacy.  

## 2016-01-02 ENCOUNTER — Ambulatory Visit (INDEPENDENT_AMBULATORY_CARE_PROVIDER_SITE_OTHER): Payer: Medicare Other | Admitting: Internal Medicine

## 2016-01-02 ENCOUNTER — Encounter: Payer: Self-pay | Admitting: Internal Medicine

## 2016-01-02 VITALS — BP 112/70 | HR 91 | Temp 98.2°F | Ht 60.0 in | Wt 223.1 lb

## 2016-01-02 DIAGNOSIS — E1159 Type 2 diabetes mellitus with other circulatory complications: Secondary | ICD-10-CM

## 2016-01-02 LAB — POCT GLYCOSYLATED HEMOGLOBIN (HGB A1C): HEMOGLOBIN A1C: 6.9

## 2016-01-02 MED ORDER — GLIPIZIDE ER 2.5 MG PO TB24: 5.0000 mg | ORAL_TABLET | Freq: Every day | ORAL | Status: DC

## 2016-01-02 NOTE — Progress Notes (Signed)
Pre visit review using our clinic review tool, if applicable. No additional management support is needed unless otherwise documented below in the visit note. 

## 2016-01-02 NOTE — Addendum Note (Signed)
Addended by: Modena Nunnery on: 01/02/2016 03:28 PM   Modules accepted: Orders

## 2016-01-02 NOTE — Patient Instructions (Signed)
Please continue: - Januvia 100 mg in am  Please try to decrease the dose of: - Glipizide XL 2.5 mg in am for 1 week - if sugars higher >> then increase back to 5 mg daily.  Please come back for a follow-up appointment in 3 months.

## 2016-01-02 NOTE — Progress Notes (Signed)
Patient ID: LEALA OLIVE, female   DOB: February 23, 1946, 70 y.o.   MRN: [id number]  HPI: CHALIYAH TESON is a 70 y.o.-year-old female, returning for f/u for DM2, dx 08/2006, non-insulin-dependent, uncontrolled, with complications (h/o Ao dissection). Last visit 8 mo ago!  She is very stressed: husband stroke survivor - paralyzed on L side and many comorbidities. He had an AMI in 05/2014 >> was hospitalized, then in [facility]. He then has a TIA, then had a fall >> L3 fracture >> [facility]. He is now home >> in wheelchair >> she is the caregiver >> very difficult and stressful. Wakes up 3-4x a night.  Last hemoglobin A1c was: Lab Results  Component Value Date   HGBA1C 6.8* 04/24/2015   HGBA1C 7.2* 08/23/2014   HGBA1C 6.9* 05/10/2014   Pt is on a regimen of: - Januvia 100 mg - Glipizide XL 5 mg in am Metformin ER  1000 mg/day added 07/2014 >> diarrhea She tried Metformin at Dx >> diarrhea/  Pt checks her sugars  2x a day: - am: 100-140 >> 110-120, in 07/2014: 130-160 >> 136-158 >> 136-170, 180 - 2h after b'fast: n/c - before lunch: n/c >> 128-161 >> n/c - 2h after lunch: n/c >> 170 >> n/c - before dinner: 150-158 >> 110-168 >> 95-131 >> 76-130 >> 72-120, 130 - 2h after dinner: n/c >> 130-134 >> n/c - bedtime: n/c - nighttime: n/c No lows. Lowest sugar was 100 >> 76 >> 72; Has hypoglycemia awareness 80. Highest sugar was 161 x 1 >> 180.  Pt's meals are: - Breakfast: eggs/cereals/toast/bacon - Lunch: sandwich (wheat)/ cottage cheese + oregano/ salads - Dinner: veggies/meat/cottage cheese/salads - Snacks: PB crackers or cottage cheese She is walking daily.  - no CKD, last BUN/creatinine:  Lab Results  Component Value Date   BUN 17 10/14/2015   CREATININE 0.79 10/14/2015  No ACEI.  - last set of lipids: Lab Results  Component Value Date   CHOL 169 10/14/2015   HDL 55.00 10/14/2015   LDLCALC 90 10/14/2015   LDLDIRECT 217.7 09/13/2011   TRIG 119.0 10/14/2015   CHOLHDL 3  10/14/2015  On Crestor.  - last eye exam was in 09/2015 >> no DR.  - no numbness and tingling in her feet.  I reviewed her chart and she also has a history of 2x thoracic Ao aneurysm. She has a pacemaker.  I reviewed pt's medications, allergies, PMH, social hx, family hx, and changes were documented in the history of present illness. Otherwise, unchanged from my initial visit note.  ROS: Constitutional: no weight gain/loss, + fatigue Eyes: no blurry vision, no xerophthalmia ENT: no sore throat, no nodules palpated in throat, no dysphagia/odynophagia, no hoarseness Cardiovascular: no CP/SOB/palpitations/+ leg swelling Respiratory: no cough/SOB Gastrointestinal: no N/V/D/C Musculoskeletal: no muscle/joint aches Skin: no rashes Neurological: no tremors/numbness/tingling/+ dizziness  PE: BP 112/70 mmHg  Pulse 91  Temp(Src) 98.2 F (36.8 C) (Oral)  Ht 5' (1.524 m)  Wt 223 lb 2 oz (101.209 kg)  BMI 43.58 kg/m2  SpO2 96% Wt Readings from Last 3 Encounters:  01/02/16 223 lb 2 oz (101.209 kg)  10/14/15 223 lb (101.152 kg)  10/10/15 223 lb 9 oz (101.407 kg)   Constitutional: overweight, in NAD Eyes: PERRLA, EOMI, no exophthalmos ENT: moist mucous membranes, no thyromegaly, no cervical lymphadenopathy Cardiovascular: RRR, + 2/6 SEM + periankle edema B Respiratory: CTA B Gastrointestinal: abdomen soft, NT, ND, BS+ Musculoskeletal: no deformities, strength intact in all 4 Skin: moist, warm, no rashes  Neurological: no tremor with outstretched hands, DTR normal in all 4  ASSESSMENT: 1. DM2, non-insulin-dependent, uncontrolled, with complications - h/o 2x aortic dissection, now SEM - Dr Sallyanne Kuster  PLAN:  1. Patient with long-standing, fairly well controlled diabetes, on oral antidiabetic regimen with Januvia + Glipizide Xl. She still has higher sugars in am but lows later in the day >> will decrease the Glipizide XL dose (will try 2.5 mg daily, but advised her to increase this to 5 mg  if this dose is not enough). We cannot use Metformin to lower her fasting sugars (which I believe are higher due to waking up many times at night). We discussed that if her am sugars are in the 130-150 range and her HbA1c is still good, we can continue only the current tx. - I suggested: Patient Instructions  Please continue: - Januvia 100 mg in am  Please try to decrease the dose of: - Glipizide XL 2.5 mg in am for 1 week - if sugars higher >> then increase back to 5 mg daily.  Please come back for a follow-up appointment in 3 months.  - continue checking sugars at different times of the day - check once a day, rotating checks - UTD with eye exams - check A1c today >> 6.9% (stable) - Return to clinic in 3 mo with sugar log

## 2016-01-05 ENCOUNTER — Other Ambulatory Visit: Payer: Self-pay | Admitting: Internal Medicine

## 2016-01-06 ENCOUNTER — Other Ambulatory Visit: Payer: Self-pay | Admitting: Cardiovascular Disease

## 2016-01-06 NOTE — Telephone Encounter (Signed)
Rx request sent to pharmacy.  

## 2016-01-08 ENCOUNTER — Ambulatory Visit
Admission: RE | Admit: 2016-01-08 | Discharge: 2016-01-08 | Disposition: A | Discharge status: Home / Self Care | Payer: Medicare Other | Source: Ambulatory Visit | Attending: Cardiothoracic Surgery | Admitting: Cardiothoracic Surgery

## 2016-01-08 ENCOUNTER — Encounter: Payer: Self-pay | Admitting: Cardiothoracic Surgery

## 2016-01-08 ENCOUNTER — Ambulatory Visit (INDEPENDENT_AMBULATORY_CARE_PROVIDER_SITE_OTHER): Payer: Medicare Other | Admitting: Cardiothoracic Surgery

## 2016-01-08 VITALS — BP 150/76 | HR 85 | Resp 20 | Ht 60.0 in | Wt 223.0 lb

## 2016-01-08 DIAGNOSIS — R011 Cardiac murmur, unspecified: Secondary | ICD-10-CM | POA: Diagnosis not present

## 2016-01-08 DIAGNOSIS — I7101 Dissection of thoracic aorta: Secondary | ICD-10-CM

## 2016-01-08 DIAGNOSIS — I71019 Dissection of thoracic aorta, unspecified: Secondary | ICD-10-CM

## 2016-01-08 DIAGNOSIS — I712 Thoracic aortic aneurysm, without rupture: Secondary | ICD-10-CM | POA: Diagnosis not present

## 2016-01-08 MED ORDER — IOPAMIDOL (ISOVUE-370) INJECTION 76%
100.0000 mL | Freq: Once | INTRAVENOUS | Status: AC | PRN
  Administered 2016-01-08: 100 mL via INTRAVENOUS

## 2016-01-08 NOTE — Progress Notes (Signed)
[facility] [id number] Date of Birth: Dec 14, 1945  Referring: Rosalita Chessman, DO Primary Care: Garnet Koyanagi, DO  Chief Complaint:    Chief Complaint  Patient presents with  . Follow-up    2 year f/u with CTA Chest, HX of aortic dissection 2008   09/02/2007  OPERATIVE REPORT  PREOPERATIVE DIAGNOSIS: Acute type A aortic dissection.  POSTOPERATIVE DIAGNOSIS: Acute type A aortic dissection.  PROCEDURE: Replacement of ascending aorta and resuspension of aortic  valve with hypothermic circulatory arrest.   History of Present Illness:    Patient is status post replacement of the ascending aorta for acute aortic dissection in 2008. She's had no significant changes over the past year with exception she notes increasing stress related to illnesses of her husband. Feels much better after new pacer placed. Has been working on  control  Of BP, Has had new onset of Afib on Xarelto .      Current Activity/ Functional Status: Patient is independent with mobility/ambulation, transfers, ADL's, IADL's.   Zubrod Score: At the time of surgery this patient's most appropriate activity status/level should be described as: []     0    Normal activity, no symptoms [x]     1    Restricted in physical strenuous activity but ambulatory, able to do out light work []     2    Ambulatory and capable of self care, unable to do work activities, up and about >50 % of waking hours                              []     3    Only limited self care, in bed greater than 50% of waking hours []     4    Completely disabled, no self care, confined to bed or chair []     5    Moribund  Past Medical History  Diagnosis Date  . Anxiety   . Hyperlipidemia   . Hypertension   . Diabetes mellitus   . GERD (gastroesophageal reflux disease)   . Cancer ([place])     skin  . Kidney stones   . Diverticulosis   . Hemorrhoids   . Acute thoracic aortic dissection [facility]) 2008   emergency surgery - [place] [facility]  . Sinus node dysfunction ([facility])     hx of PPM    Past Surgical History  Procedure Laterality Date  . Pacemaker insertion  08/2006    dual chamber Medtronic EnRhythm; r/t sinus node dysfunction   . Appendectomy    . Abdominal hysterectomy  1984  . Thoracic aortic aneurysm repair  2000    type III  . Repair of acute ascending thoracic aortic dissection  2008    Dr. Servando Snare  . Transthoracic echocardiogram  09/20/2012    EF=>55% with mild conc LVH; LA mod dilated; RA mildly dilated; mild MR/TR/AR  . Pacemaker generator change  2014    Medtronic adapta  . Eye surgery  2014    Rght eye  . Pacemaker generator change N/A 08/29/2013    Procedure: PACEMAKER GENERATOR CHANGE;  Surgeon: Sanda Klein, MD;  Location: [facility] CATH LAB;  Service: Cardiovascular;  Laterality: N/A;    Family History  Problem Relation Age of Onset  . Heart attack Father   . Cancer Neg Hx   . Kidney  disease Mother   . Heart attack Maternal Grandmother   . Valvular heart disease Son     valve replacement at 53    Social History   Social History  . Marital Status: Married    Spouse Name: N/A  . Number of Children: 2  . Years of Education: N/A   Occupational History  .     Social History Main Topics  . Smoking status: Former Smoker    Quit date: 09/04/1993  . Smokeless tobacco: Never Used  . Alcohol Use: No  . Drug Use: No  . Sexual Activity: Not on file   Other Topics Concern  . Not on file   Social History Narrative    History  Smoking status  . Former Smoker  . Quit date: 09/04/1993  Smokeless tobacco  . Never Used    History  Alcohol Use No     Allergies  Allergen Reactions  . Oxycodone Itching  . Vicodin [Hydrocodone-Acetaminophen] Itching  . Neomycin-Bacitracin Zn-Polymyx Other (See Comments)    Red , swollen eye  . Sulfonamide Derivatives     Just had reaction when taking during pregnancy- last child birth 76    Current Outpatient  Prescriptions  Medication Sig Dispense Refill  . ALPRAZolam (XANAX) 0.5 MG tablet TAKE 1 TABLET BY MOUTH 3 TIMES A DAY AS NEEDED FOR ANXIETY 90 tablet 0  . carvedilol (COREG) 25 MG tablet TAKE 1/2 TABLET BY MOUTH EVERY MORNING AND 1 TABLET IN EVENING 180 tablet 1  . cholecalciferol (VITAMIN D) 1000 UNITS tablet Take 1,000 Units by mouth daily.    . citalopram (CELEXA) 40 MG tablet TAKE 1 TABLET (40 MG TOTAL) BY MOUTH DAILY. OFFICE VISIT DUE NOW 90 tablet 0  . fenofibrate 160 MG tablet TAKE 1 TABLET (160 MG TOTAL) BY MOUTH DAILY. 30 tablet 3  . fluticasone (FLONASE) 50 MCG/ACT nasal spray Place 2 sprays into both nostrils daily. 16 g 6  . furosemide (LASIX) 40 MG tablet Take 1 tablet (40 mg total) by mouth daily. 90 tablet 1  . glipiZIDE (GLIPIZIDE XL) 2.5 MG 24 hr tablet Take 2 tablets (5 mg total) by mouth daily with breakfast. 180 tablet 1  . hyoscyamine (LEVSIN SL) 0.125 MG SL tablet TAKE 1 TABLET BY MOUTH TWICE A DAY AS NEEDED FOR CRAMPING OR DIARRHEA OR LOOSE STOOLS 60 tablet 2  . JANUVIA 100 MG tablet TAKE 1 TABLET (100 MG TOTAL) BY MOUTH DAILY. 30 tablet 2  . lansoprazole (PREVACID) 15 MG capsule Take 15 mg by mouth as needed (for heart burn).     Vladimir Faster Glycol-Propyl Glycol (SYSTANE OP) Apply 1 drop to eye 4 (four) times daily - after meals and at bedtime.    . Probiotic Product (PRO-BIOTIC BLEND PO) Take 1 tablet by mouth daily.    . rosuvastatin (CRESTOR) 20 MG tablet Take 1 tablet (20 mg total) by mouth daily. 30 tablet 6  . XARELTO 20 MG TABS tablet TAKE 1 TABLET BY MOUTH DAILY WITH SUPPER 30 tablet 4   No current facility-administered medications for this visit.       Review of Systems:     Cardiac Review of Systems: Y or N  Chest Pain [  n  ]  Resting SOB [n   ] Exertional SOB  Blue.Reese  ]  Vertell Limber Florencio.Farrier  ]   Pedal Edema Florencio.Farrier   ]    Palpitations Florencio.Farrier  ] Syncope  Florencio.Farrier  ]   Presyncope [  n ]  General Review of Systems: [Y] = yes [  ]=no Constitional: recent weight change [n  ];  anorexia [n  ]; fatigue [ y ]; nausea [ n ]; night sweats [ n ]; fever [ n ]; or chills [n  ];                                                                                                                                          Dental: poor dentition[ n ];   Eye : blurred vision [  ]; diplopia [   ]; vision changes [  ];  Amaurosis fugax[  ]; Resp: cough [  ];  wheezing[  ];  hemoptysis[  ]; shortness of breath[ n ]; paroxysmal nocturnal dyspnea[n  ]; dyspnea on exertion[ y ]; or orthopnea[  ];  GI:  gallstones[  ], vomiting[  ];  dysphagia[  ]; melena[  ];  hematochezia [  ]; heartburn[  ];   Hx of  Colonoscopy[  ]; GU: kidney stones [  ]; hematuria[  ];   dysuria [  ];  nocturia[  ];  history of     obstruction [  ];             Skin: rash, swelling[  ];, hair loss[  ];  peripheral edema[  ];  or itching[  ]; Musculosketetal: myalgias[  ];  joint swelling[  ];  joint erythema[  ];  joint pain[  ];  back pain[  ];  Heme/Lymph: bruising[  ];  bleeding[  ];  anemia[  ];  Neuro: TIA[  ];  headaches[  ];  stroke[  ];  vertigo[  ];  seizures[  ];   paresthesias[  ];  difficulty walking[  ];  Psych:depression[  ]; anxiety[  ];  Endocrine: diabetes[  ];  thyroid dysfunction[  ];  Immunizations: Flu [?  ]; Pneumococcal[ ? ];  Other:  Physical Exam: BP 150/76 mmHg  Pulse 85  Resp 20  Ht 5' (1.524 m)  Wt 223 lb (101.152 kg)  BMI 43.55 kg/m2  SpO2 97% Rhythm is regular today, she does not appear on exam to be in atrial fibrillation currently General appearance: alert, cooperative and appears older than stated age Neurologic: intact Heart: regular rate and rhythm, S1, S2 normal, early systolic ejection murmur not previously appreciated no murmur of aortic insufficiency click, rub or gallop and normal apical impulse Lungs: clear to auscultation bilaterally and normal percussion bilaterally Abdomen: soft, non-tender; bowel sounds normal; no masses,  no organomegaly Extremities: extremities  normal, atraumatic, no cyanosis or edema and Homans sign is negative, no sign of DVT Wound: sternum stable   Diagnostic Studies & Laboratory data:     Recent Radiology Findings:  Ct Angio Chest Aorta W/cm &/or Wo/cm  01/08/2016  CLINICAL DATA:  Followup thoracic aortic dissection EXAM: CT ANGIOGRAPHY CHEST WITH CONTRAST TECHNIQUE: Multidetector CT imaging  of the chest was performed using the standard protocol during bolus administration of intravenous contrast. Multiplanar CT image reconstructions and MIPs were obtained to evaluate the vascular anatomy. CONTRAST:  100 mL Isovue 370. Creatinine was obtained on site at [facility] at [address] Results: Creatinine 0.9 mg/dL. COMPARISON:  01/03/2014 FINDINGS: Vascular: There again noted changes consistent with type A thoracic aortic dissection. Graft repair of the ascending aorta is again identified. Some better visualized thrombus is now seen in the proximal portion of the false lumen. The lumen opacifies in a better degree than that seen on the prior exam although this may be related to the timing of the contrast bolus. There are areas of enhancement identified between the true and false lumen best seen on images 20 through 22 of series 5 as well as images 30-36 of series 5. These likely represent better delineated communications between the true and false lumen. The aneurysm extends into the abdominal aorta. The celiac axis, superior mesenteric artery and right renal artery are supplied by the true lumen. The false lumen and true lumen combine to supply the left renal artery. The pulmonary artery is within normal limits as visualized. Nonvascular: The lungs are well-aerated without focal infiltrate or sizable effusion. No focal parenchymal nodule is seen. A pacing device is again noted and stable. The previously seen right thyroid nodule is not as well appreciated on the current study. The liver is diffusely fatty infiltrated. Renal cystic  changes are seen. Tiny nonobstructing renal stones are noted. Stable right adrenal nodule is noted. The remainder of the upper abdomen is within normal limits. No acute bony abnormality is seen. Review of the MIP images confirms the above findings. IMPRESSION: Stable morphology of a type A aortic dissection with evidence of surgical repair of the proximal aorta. Some better delineated thrombus in the proximal false lumen is also noted best seen on image number 25 of series 5. The false lumen is better delineated on the current study likely related to the timing of the contrast bolus. This allows for opacification of small communications between the true and false lumen particularly in the descending aorta. This is best seen in the sagittal reconstructions. Given this slight change a short-term followup in 6 months as opposed to 12 months may be warranted. Clinical correlation with the symptomatology is recommended. Distal branching from the true and false lumen is stable in appearance. Chronic changes in abdomen Electronically Signed   By: Inez Catalina M.D.   On: 01/08/2016 11:14    CT march 2014 RADIOLOGY REPORT*  Clinical Data: History of aortic dissection.  CT CHEST, ABDOMEN AND PELVIS WITH CONTRAST  Technique: Multidetector CT imaging of the chest, abdomen and  pelvis was performed following the standard protocol during bolus  administration of intravenous contrast.  Contrast: 125mL OMNIPAQUE IOHEXOL 300 MG/ML SOLN  Comparison: CT of the chest, abdomen and pelvis 06/18/2011.  CT CHEST  Findings:  Mediastinum: Postoperative changes of median sternotomy are noted,  likely from prior graft repair of the ascending thoracic aorta  (presumably from prior type A aortic dissection). Again noted is a  dissection which begins after the distal end of the ascending  thoracic aorta graft in the proximal aortic arch. The dissection  flap does not appear to extend into the great vessels. The  dissection  does extend inferiorly to the level of the pelvis (see  below). The aortic arch is mildly dilated (3.2 cm in diameter)  which is unchanged, and the  descending thoracic aorta is also  mildly dilated up to 3.4 cm in diameter at the level of the  pulmonary arteries (also unchanged). Contrast fills both the true  and false lumens. No abnormal high attenuation material is seen  adjacent to the thoracic aorta to suggest active hemorrhage at this  time.  Heart size is mildly enlarged. There is no significant pericardial  fluid, thickening or pericardial calcification. Calcifications of  the aortic valve. Left-sided pacemaker with lead terminating in  the right atrium and right ventricular apex. No pathologically  enlarged mediastinal or hilar lymph nodes. Esophagus is  unremarkable appearance.  Lungs/Pleura: No acute consolidative airspace disease. No pleural  effusions. There are some linear opacities scattered throughout  the periphery of the lung bases bilaterally which are unchanged and  compatible with areas of mild chronic pleural parenchymal scarring.  No suspicious appearing pulmonary nodules or masses are identified  at this time.  Musculoskeletal: Sternotomy wires. There are no aggressive  appearing lytic or blastic lesions noted in the visualized portions  of the skeleton.  IMPRESSION:  1. Postoperative changes of graft repair of the ascending thoracic  aorta for prior type A dissection. The residual  dissection/aneurysm in the arch and descending thoracic aorta is  essentially unchanged in appearance compared to the prior  examination, as detailed above.  2. Mild cardiomegaly.  3. Additional incidental findings, as above.  CT ABDOMEN AND PELVIS  Findings:  Abdomen/Pelvis: The previously described dissection flap extends  into the pelvis where appears to terminate in the distal left  common iliac artery. All of the abdominal and pelvic vessels  appear to be perfusing well at  this time, with the true lumen  giving rise to the celiac axis, superior mesenteric artery, right  renal artery and the inferior mesenteric artery, and the false  lumen giving rise to the left renal artery. The dissection flap  terminates in the distal left common iliac artery, with the true  lumen giving rise to the left internal iliac artery and the false  lumen giving rise to left external iliac artery. The dissection  flap does not extend into the right iliac vasculature. Maximal  abdominal aortic diameter is 2.5 x 2.0 cm at a level just below the  renal arteries.  The appearance of the liver, gallbladder, pancreas, spleen and left  adrenal gland is unremarkable. Low attenuation right adrenal  nodule is unchanged in size measuring 17 mm in diameter and has  been previously characterized as an adenoma. Low attenuation  lesions in the kidneys bilaterally are unchanged and compatible  with simple cysts, with the largest lesion in the lower pole of the  right kidney measuring approximately 3.3 cm in diameter.  No significant volume of ascites. No pneumoperitoneum. No  pathologic distension of small bowel. There are numerous colonic  diverticula, particularly of the descending colon and sigmoid  colon, without surrounding inflammatory changes to suggest acute  diverticulitis at this time. Status post hysterectomy. Ovaries  are unremarkable in appearance. Urinary bladder is normal in  appearance. No definite pathologic lymphadenopathy identified  within the abdomen or pelvis.  Musculoskeletal: There are no aggressive appearing lytic or blastic  lesions noted in the visualized portions of the skeleton.  IMPRESSION:  1. No change in the extent of the continuation of the dissection  from the thoracic aorta into the abdomen and pelvis, as detailed  above.  2. 1.7 cm right adrenal adenoma is unchanged.  3. Mild colonic diverticulosis without evidence to  suggest acute  diverticulitis at this  time.  4. Additional incidental findings, as above, similar to prior  studies.  Original Report Authenticated By: Vinnie Langton, M.D.     Recent Lab Findings: Lab Results  Component Value Date   WBC 6.8 08/22/2013   HGB 14.2 08/22/2013   HCT 41.7 08/22/2013   PLT 228 08/22/2013   GLUCOSE 159* 10/14/2015   CHOL 169 10/14/2015   TRIG 119.0 10/14/2015   HDL 55.00 10/14/2015   LDLDIRECT 217.7 09/13/2011   LDLCALC 90 10/14/2015   ALT 21 10/14/2015   AST 23 10/14/2015   NA 140 10/14/2015   K 4.5 10/14/2015   CL 106 10/14/2015   CREATININE 0.79 10/14/2015   BUN 17 10/14/2015   CO2 26 10/14/2015   TSH 0.986 08/22/2013   INR 0.97 08/22/2013   HGBA1C 6.9 01/02/2016      Assessment / Plan:      Patient seen in followup after aortic dissection acute repair done 09/02/2007, followup CTA of the chest done today Followup CTA of the chest shows persistent false lumen after repair of acute aortic dissection and replacement of ascending aorta and resuspension of aortic valve, followup scan shows no progressive enlargement of the aorta especially in the arch. The patient notes that she's been diligent about control of her blood pressure, and recently had her blood pressure medications increased. Her blood pressure is elevated today she notes she did not take her medication this morning as she usually does Plan to see her back in 12 months with followup CTA scan of the chest. Question of new systolic aortic murmur not previously appreciated, last echocardiogram 2015 patient's to see cardiology in the near future consider repeat echocardiogram     Grace Isaac MD  Beeper [phone] Office [phone] 01/08/2016 12:50 PM

## 2016-01-09 ENCOUNTER — Telehealth: Payer: Self-pay | Admitting: *Deleted

## 2016-01-09 NOTE — Telephone Encounter (Signed)
Echo ordered to evaluate new systolic murmur heard during exam by Dr. Servando Snare.  Patient notified and awaiting call for appt.

## 2016-01-09 NOTE — Addendum Note (Signed)
Addended by: Janett Labella A on: 01/09/2016 03:42 PM   Modules accepted: Orders

## 2016-01-12 ENCOUNTER — Ambulatory Visit (INDEPENDENT_AMBULATORY_CARE_PROVIDER_SITE_OTHER): Payer: Medicare Other | Admitting: *Deleted

## 2016-01-12 ENCOUNTER — Telehealth: Payer: Self-pay | Admitting: Cardiology

## 2016-01-12 ENCOUNTER — Other Ambulatory Visit: Payer: Self-pay | Admitting: Family Medicine

## 2016-01-12 DIAGNOSIS — I495 Sick sinus syndrome: Secondary | ICD-10-CM

## 2016-01-12 NOTE — Progress Notes (Signed)
Remote pacemaker transmission.   

## 2016-01-12 NOTE — Telephone Encounter (Signed)
Spoke with pt and reminded pt of remote transmission that is due today. Pt verbalized understanding.   

## 2016-01-13 ENCOUNTER — Other Ambulatory Visit: Payer: Self-pay | Admitting: Family Medicine

## 2016-01-14 ENCOUNTER — Encounter: Payer: Self-pay | Admitting: Cardiology

## 2016-01-14 LAB — CUP PACEART REMOTE DEVICE CHECK
Battery Remaining Longevity: 142 mo
Battery Voltage: 2.79 V
Brady Statistic AP VP Percent: 4 %
Brady Statistic AS VP Percent: 0 %
Brady Statistic AS VS Percent: 0 %
Date Time Interrogation Session: 20170213163539
Implantable Lead Implant Date: 20071016
Implantable Lead Location: 753860
Lead Channel Impedance Value: 764 Ohm
Lead Channel Pacing Threshold Pulse Width: 0.4 ms
Lead Channel Setting Pacing Amplitude: 1.5 V
Lead Channel Setting Pacing Amplitude: 2 V
Lead Channel Setting Pacing Pulse Width: 0.4 ms
Lead Channel Setting Sensing Sensitivity: 5.6 mV
MDC IDC LEAD IMPLANT DT: 20071016
MDC IDC LEAD LOCATION: 753859
MDC IDC MSMT BATTERY IMPEDANCE: 134 Ohm
MDC IDC MSMT LEADCHNL RA IMPEDANCE VALUE: 404 Ohm
MDC IDC MSMT LEADCHNL RA PACING THRESHOLD AMPLITUDE: 0.625 V
MDC IDC MSMT LEADCHNL RA PACING THRESHOLD PULSEWIDTH: 0.4 ms
MDC IDC MSMT LEADCHNL RV PACING THRESHOLD AMPLITUDE: 0.625 V
MDC IDC MSMT LEADCHNL RV SENSING INTR AMPL: 16 mV
MDC IDC STAT BRADY AP VS PERCENT: 96 %

## 2016-01-21 ENCOUNTER — Encounter: Payer: Self-pay | Admitting: Family Medicine

## 2016-01-21 ENCOUNTER — Other Ambulatory Visit: Payer: Self-pay | Admitting: Family Medicine

## 2016-01-22 ENCOUNTER — Other Ambulatory Visit: Payer: Self-pay

## 2016-01-22 ENCOUNTER — Ambulatory Visit (HOSPITAL_COMMUNITY): Payer: Medicare Other | Attending: Cardiovascular Disease

## 2016-01-22 DIAGNOSIS — R011 Cardiac murmur, unspecified: Secondary | ICD-10-CM | POA: Diagnosis not present

## 2016-01-22 DIAGNOSIS — I352 Nonrheumatic aortic (valve) stenosis with insufficiency: Secondary | ICD-10-CM | POA: Insufficient documentation

## 2016-01-22 DIAGNOSIS — I7101 Dissection of thoracic aorta: Secondary | ICD-10-CM | POA: Diagnosis not present

## 2016-01-22 DIAGNOSIS — I77819 Aortic ectasia, unspecified site: Secondary | ICD-10-CM | POA: Diagnosis not present

## 2016-01-22 DIAGNOSIS — I71019 Dissection of thoracic aorta, unspecified: Secondary | ICD-10-CM

## 2016-01-22 DIAGNOSIS — I119 Hypertensive heart disease without heart failure: Secondary | ICD-10-CM | POA: Diagnosis not present

## 2016-01-22 MED ORDER — ALPRAZOLAM 0.5 MG PO TABS: ORAL_TABLET | ORAL | Status: DC

## 2016-01-22 NOTE — Telephone Encounter (Signed)
Pharmacy called in to check the status of Rx.

## 2016-01-22 NOTE — Telephone Encounter (Signed)
Last seen 10/14/15 and filled 12/19/15 #90   Please advise    KP

## 2016-01-24 ENCOUNTER — Other Ambulatory Visit: Payer: Self-pay | Admitting: Internal Medicine

## 2016-01-29 ENCOUNTER — Other Ambulatory Visit: Payer: Self-pay | Admitting: Family Medicine

## 2016-02-23 ENCOUNTER — Encounter: Payer: Self-pay | Admitting: Family Medicine

## 2016-02-24 ENCOUNTER — Telehealth: Payer: Self-pay

## 2016-02-24 MED ORDER — ALPRAZOLAM 0.5 MG PO TABS: ORAL_TABLET | ORAL | Status: DC

## 2016-02-24 NOTE — Telephone Encounter (Signed)
Rx faxed.    KP 

## 2016-02-24 NOTE — Telephone Encounter (Signed)
Refill x1   1 refill 

## 2016-02-24 NOTE — Telephone Encounter (Signed)
Last seen 10/04/15 and filled 01/22/16 #90   Please advise    KP

## 2016-03-01 ENCOUNTER — Ambulatory Visit: Payer: Medicare Other | Admitting: Family Medicine

## 2016-03-01 ENCOUNTER — Telehealth: Payer: Self-pay | Admitting: Family Medicine

## 2016-03-01 DIAGNOSIS — Z0289 Encounter for other administrative examinations: Secondary | ICD-10-CM

## 2016-03-01 NOTE — Telephone Encounter (Signed)
Having to take husband to foot doctor. Wound on his foot opened and oozing. She apologized for the short notice but worried about getting him taken care of. Charge or no charge?

## 2016-03-01 NOTE — Telephone Encounter (Signed)
No charge. 

## 2016-03-08 ENCOUNTER — Ambulatory Visit: Payer: Medicare Other | Admitting: Family Medicine

## 2016-04-02 ENCOUNTER — Ambulatory Visit: Payer: Medicare Other | Admitting: Internal Medicine

## 2016-04-06 ENCOUNTER — Other Ambulatory Visit: Payer: Self-pay | Admitting: Cardiovascular Disease

## 2016-04-06 ENCOUNTER — Other Ambulatory Visit: Payer: Self-pay | Admitting: Internal Medicine

## 2016-04-08 NOTE — Telephone Encounter (Signed)
Pt called in stating that she received a bill for no showing. Please reverse charge per provider.     Thanks

## 2016-04-21 ENCOUNTER — Telehealth: Payer: Self-pay | Admitting: *Deleted

## 2016-04-21 ENCOUNTER — Other Ambulatory Visit: Payer: Self-pay | Admitting: Family Medicine

## 2016-04-21 ENCOUNTER — Encounter: Payer: Self-pay | Admitting: *Deleted

## 2016-04-21 NOTE — Telephone Encounter (Signed)
Pre-Visit Call completed with patient and chart updated.   Pre-Visit Info documented in Specialty Comments under [facility].    

## 2016-04-21 NOTE — Telephone Encounter (Signed)
Unable to reach patient at time of pre-visit call. Left message for patient to return call when available.  

## 2016-04-21 NOTE — Telephone Encounter (Signed)
Last OV: 10/14/15, CPE scheduled tomorrow (04/22/16) Last filled: 02/24/16, #90, 0 RF Sig: TAKE 1 TABLET BY MOUTH 3 TIMES A DAY AS NEEDED FOR ANXIETY UDS: 02/21/13, positive for alprazloam, low risk

## 2016-04-22 ENCOUNTER — Encounter: Payer: Self-pay | Admitting: Family Medicine

## 2016-04-22 ENCOUNTER — Ambulatory Visit (INDEPENDENT_AMBULATORY_CARE_PROVIDER_SITE_OTHER): Payer: Medicare Other | Admitting: Family Medicine

## 2016-04-22 VITALS — BP 118/70 | HR 77 | Temp 98.2°F | Ht 60.5 in | Wt 220.0 lb

## 2016-04-22 DIAGNOSIS — Z1159 Encounter for screening for other viral diseases: Secondary | ICD-10-CM

## 2016-04-22 DIAGNOSIS — E785 Hyperlipidemia, unspecified: Secondary | ICD-10-CM

## 2016-04-22 DIAGNOSIS — IMO0002 Reserved for concepts with insufficient information to code with codable children: Secondary | ICD-10-CM

## 2016-04-22 DIAGNOSIS — E1165 Type 2 diabetes mellitus with hyperglycemia: Secondary | ICD-10-CM | POA: Diagnosis not present

## 2016-04-22 DIAGNOSIS — Z0000 Encounter for general adult medical examination without abnormal findings: Secondary | ICD-10-CM | POA: Diagnosis not present

## 2016-04-22 DIAGNOSIS — F411 Generalized anxiety disorder: Secondary | ICD-10-CM

## 2016-04-22 DIAGNOSIS — E1151 Type 2 diabetes mellitus with diabetic peripheral angiopathy without gangrene: Secondary | ICD-10-CM | POA: Diagnosis not present

## 2016-04-22 DIAGNOSIS — I10 Essential (primary) hypertension: Secondary | ICD-10-CM

## 2016-04-22 DIAGNOSIS — Z23 Encounter for immunization: Secondary | ICD-10-CM

## 2016-04-22 LAB — COMPREHENSIVE METABOLIC PANEL
ALT: 22 U/L (ref 0–35)
AST: 27 U/L (ref 0–37)
Albumin: 4.5 g/dL (ref 3.5–5.2)
Alkaline Phosphatase: 40 U/L (ref 39–117)
BUN: 18 mg/dL (ref 6–23)
CHLORIDE: 103 meq/L (ref 96–112)
CO2: 29 meq/L (ref 19–32)
CREATININE: 0.84 mg/dL (ref 0.40–1.20)
Calcium: 9.7 mg/dL (ref 8.4–10.5)
GFR: 71.34 mL/min (ref >60.00)
Glucose, Bld: 116 mg/dL — ABNORMAL HIGH (ref 70–99)
Potassium: 3.9 mEq/L (ref 3.5–5.1)
SODIUM: 140 meq/L (ref 135–145)
Total Bilirubin: 0.7 mg/dL (ref 0.2–1.2)
Total Protein: 7.3 g/dL (ref 6.0–8.3)

## 2016-04-22 LAB — LIPID PANEL
CHOLESTEROL: 165 mg/dL (ref 0–200)
HDL: 46.4 mg/dL (ref >39.00)
LDL Cholesterol: 91 mg/dL (ref 0–99)
NONHDL: 118.91
TRIGLYCERIDES: 138 mg/dL (ref 0.0–149.0)
Total CHOL/HDL Ratio: 4
VLDL: 27.6 mg/dL (ref 0.0–40.0)

## 2016-04-22 MED ORDER — TETANUS-DIPHTH-ACELL PERTUSSIS 5-2.5-18.5 LF-MCG/0.5 IM SUSP: 0.5000 mL | Freq: Once | INTRAMUSCULAR | Status: DC

## 2016-04-22 NOTE — Progress Notes (Addendum)
Subjective:   [name] is a 70 y.o. female who presents for Medicare Annual (Subsequent) preventive examination.  Review of Systems:   Review of Systems  Constitutional: Negative for activity change, appetite change and fatigue.  HENT: Negative for hearing loss, congestion, tinnitus and ear discharge.   Eyes: Negative for visual disturbance (see optho q1y -- vision corrected to 20/20 with glasses).  Respiratory: Negative for cough, chest tightness and shortness of breath.   Cardiovascular: Negative for chest pain, palpitations and leg swelling.  Gastrointestinal: Negative for abdominal pain, diarrhea, constipation and abdominal distention.  Genitourinary: Negative for urgency, frequency, decreased urine volume and difficulty urinating.  Musculoskeletal: Negative for back pain, arthralgias and gait problem.  Skin: Negative for color change, pallor and rash.  Neurological: Negative for dizziness, light-headedness, numbness and headaches.  Hematological: Negative for adenopathy. Does not bruise/bleed easily.  Psychiatric/Behavioral: Negative for suicidal ideas, confusion, sleep disturbance, self-injury, dysphoric mood, decreased concentration and agitation.  Pt is able to read and write and can do all ADLs No risk for falling No abuse/ violence in home           Objective:     Vitals: BP 118/70 mmHg  Pulse 77  Temp(Src) 98.2 F (36.8 C) (Oral)  Ht 5' 0.5" (1.537 m)  Wt 220 lb (99.791 kg)  BMI 42.24 kg/m2  SpO2 96%  Body mass index is 42.24 kg/(m^2).  BP 118/70 mmHg  Pulse 77  Temp(Src) 98.2 F (36.8 C) (Oral)  Ht 5' 0.5" (1.537 m)  Wt 220 lb (99.791 kg)  BMI 42.24 kg/m2  SpO2 96% General appearance: alert, cooperative, appears stated age and no distress Head: Normocephalic, without obvious abnormality, atraumatic Eyes: conjunctivae/corneas clear. PERRL, EOM's intact. Fundi benign. Ears: normal TM's and external ear canals both ears Nose: Nares normal.  Septum midline. Mucosa normal. No drainage or sinus tenderness. Throat: lips, mucosa, and tongue normal; teeth and gums normal Neck: no adenopathy, no carotid bruit, no JVD, supple, symmetrical, trachea midline and thyroid not enlarged, symmetric, no tenderness/mass/nodules Back: symmetric, no curvature. ROM normal. No CVA tenderness. Lungs: clear to auscultation bilaterally Breasts: normal appearance, no masses or tenderness Heart: S1, S2 normal  + murmur Abdomen: soft, non-tender; bowel sounds normal; no masses,  no organomegaly Pelvic: not indicated; status post hysterectomy, negative ROS Extremities: extremities normal, atraumatic, no cyanosis or edema Pulses: 2+ and symmetric Skin: Skin color, texture, turgor normal. No rashes or lesions Lymph nodes: Cervical, supraclavicular, and axillary nodes normal. Neurologic: Alert and oriented X 3, normal strength and tone. Normal symmetric reflexes. Normal coordination and gait  Tobacco History  Smoking status  . Former Smoker  . Quit date: 09/04/1993  Smokeless tobacco  . Never Used     Counseling given: Not Answered   Past Medical History  Diagnosis Date  . Anxiety   . Hyperlipidemia   . Hypertension   . Diabetes mellitus   . GERD (gastroesophageal reflux disease)   . Cancer ([place])     skin  . Kidney stones   . Diverticulosis   . Hemorrhoids   . Acute thoracic aortic dissection [facility]) 2008    emergency surgery - [facility]  . Sinus node dysfunction ([facility])     hx of PPM   Past Surgical History  Procedure Laterality Date  . Pacemaker insertion  08/2006    dual chamber Medtronic EnRhythm; r/t sinus node dysfunction   . Appendectomy    . Abdominal hysterectomy  1984  . Thoracic aortic aneurysm  repair  2000    type III  . Repair of acute ascending thoracic aortic dissection  2008    Dr. Servando Snare  . Transthoracic echocardiogram  09/20/2012    EF=>55% with mild conc LVH; LA mod dilated; RA mildly dilated; mild MR/TR/AR  .  Pacemaker generator change  2014    Medtronic adapta  . Eye surgery  2014    Rght eye  . Pacemaker generator change N/A 08/29/2013    Procedure: PACEMAKER GENERATOR CHANGE;  Surgeon: Sanda Klein, MD;  Location: [facility];  Service: Cardiovascular;  Laterality: N/A;   Family History  Problem Relation Age of Onset  . Heart attack Father   . Cancer Neg Hx   . Kidney disease Mother   . Heart attack Maternal Grandmother   . Valvular heart disease Son     valve replacement at 40   History  Sexual Activity  . Sexual Activity: Not Currently    Outpatient Encounter Prescriptions as of 04/22/2016  Medication Sig  . carvedilol (COREG) 25 MG tablet TAKE 1/2 TABLET BY MOUTH EVERY MORNING AND 1 TABLET IN EVENING  . cholecalciferol (VITAMIN D) 1000 UNITS tablet Take 1,000 Units by mouth daily.  . citalopram (CELEXA) 40 MG tablet Take 1 tablet (40 mg total) by mouth daily.  . fenofibrate 160 MG tablet TAKE 1 TABLET (160 MG TOTAL) BY MOUTH DAILY.  . fluticasone (FLONASE) 50 MCG/ACT nasal spray Place 2 sprays into both nostrils daily. (Patient taking differently: Place 2 sprays into both nostrils daily as needed. )  . furosemide (LASIX) 40 MG tablet Take 1 tablet (40 mg total) by mouth daily.  [name] glipiZIDE (GLUCOTROL XL) 5 MG 24 hr tablet Take 1 tablet (5 mg total) by mouth 2 (two) times daily.  . hyoscyamine (LEVSIN SL) 0.125 MG SL tablet TAKE 1 TABLET BY MOUTH TWICE A DAY AS NEEDED FOR CRAMPING OR DIARRHEA OR LOOSE STOOLS  . JANUVIA 100 MG tablet TAKE 1 TABLET (100 MG TOTAL) BY MOUTH DAILY.  [name] lansoprazole (PREVACID) 15 MG capsule Take 15 mg by mouth as needed (for heart burn).   Vladimir Faster Glycol-Propyl Glycol (SYSTANE OP) Apply 1 drop to eye 4 (four) times daily - after meals and at bedtime.  . Probiotic Product (PRO-BIOTIC BLEND PO) Take 1 tablet by mouth daily.  . rosuvastatin (CRESTOR) 20 MG tablet Take 1 tablet (20 mg total) by mouth daily.  Alveda Reasons 20 MG TABS tablet TAKE 1 TABLET BY  MOUTH DAILY WITH SUPPER  . [DISCONTINUED] ALPRAZolam (XANAX) 0.5 MG tablet TAKE 1 TABLET BY MOUTH 3 TIMES A DAY AS NEEDED FOR ANXIETY  . [DISCONTINUED] carvedilol (COREG) 25 MG tablet TAKE 1/2 TABLET BY MOUTH EVERY MORNING AND 1 TABLET IN EVENING  . [DISCONTINUED] citalopram (CELEXA) 40 MG tablet Take 1 tablet (40 mg total) by mouth daily.  . [DISCONTINUED] fenofibrate 160 MG tablet TAKE 1 TABLET (160 MG TOTAL) BY MOUTH DAILY.  . [DISCONTINUED] glipiZIDE (GLUCOTROL XL) 5 MG 24 hr tablet TAKE 1 TABLET BY MOUTH EVERY DAY  . [DISCONTINUED] glipiZIDE (GLUCOTROL XL) 5 MG 24 hr tablet Take 1 tablet (5 mg total) by mouth 2 (two) times daily.  . [DISCONTINUED] rosuvastatin (CRESTOR) 20 MG tablet Take 1 tablet (20 mg total) by mouth daily.  . Tdap (BOOSTRIX) 5-2.5-18.5 LF-MCG/0.5 injection Inject 0.5 mLs into the muscle once.   No facility-administered encounter medications on file as of 04/22/2016.    Activities of Daily Living In your present state of health, do  you have any difficulty performing the following activities: 04/22/2016 10/14/2015  Hearing? N N  Vision? N N  Difficulty concentrating or making decisions? N N  Walking or climbing stairs? N N  Dressing or bathing? N N  Doing errands, shopping? N N    Patient Care Team: [name], DO as PCP - General Sanda Klein, MD as Consulting Physician (Cardiology) [name], MD as Consulting Physician (Ophthalmology) Grace Isaac, MD as Consulting Physician (Cardiothoracic Surgery)    Assessment:    CPE Exercise Activities and Dietary recommendations Current Exercise Habits: The patient does not participate in regular exercise at present, Exercise limited by: orthopedic condition(s)  Goals    None     Fall Risk Fall Risk  04/22/2016 10/14/2015 01/21/2014  Falls in the past year? No No No   Depression Screen PHQ 2/9 Scores 04/22/2016 10/14/2015 01/21/2014  PHQ - 2 Score 0 0 0  Exception Documentation Patient  refusal - -     Cognitive Testing mmse 30/30  Immunization History  Administered Date(s) Administered  . Influenza Split 09/13/2011  . Influenza Whole 09/19/2008, 09/09/2009, 10/15/2010  . Influenza, High Dose Seasonal PF 10/14/2015  . Influenza,inj,Quad PF,36+ Mos 10/11/2013, 08/23/2014  . Pneumococcal Conjugate-13 10/14/2015  . Pneumococcal Polysaccharide-23 01/09/2013  . Td 09/01/2006  . Zoster 12/09/2010   Screening Tests Health Maintenance  Topic Date Due  . MAMMOGRAM  09/14/2013  . URINE MICROALBUMIN  01/21/2015  . COLONOSCOPY  10/13/2016 (Originally 06/22/2014)  . INFLUENZA VACCINE  06/29/2016  . TETANUS/TDAP  09/01/2016  . OPHTHALMOLOGY EXAM  10/07/2016  . HEMOGLOBIN A1C  10/23/2016  . FOOT EXAM  04/22/2017  . DEXA SCAN  Completed  . ZOSTAVAX  Completed  . Hepatitis C Screening  Completed  . PNA vac Low Risk Adult  Completed      Plan:    see AVS During the course of the visit the patient was educated and counseled about the following appropriate screening and preventive services:   Vaccines to include Pneumoccal, Influenza, Hepatitis B, Td, Zostavax, HCV  Electrocardiogram  Cardiovascular Disease  Colorectal cancer screening  Bone density screening  Diabetes screening  Glaucoma screening  Mammography/PAP  Nutrition counseling   Patient Instructions (the written plan) was given to the patient.  1. Need for hepatitis C screening test   - Hepatitis C antibody  2. DM (diabetes mellitus) type II uncontrolled, periph vascular disorder (HCC) con't glipizide and januvia - CBC with Differential/Platelet - Hemoglobin A1c - Lipid panel - Comprehensive metabolic panel - Hemoglobin A1c; Future  3. Hyperlipidemia con't crestor - CBC with Differential/Platelet - Hemoglobin A1c - Lipid panel - Comprehensive metabolic panel - Lipid panel; Future  4. Essential hypertension con't coreg - CBC with Differential/Platelet - Hemoglobin A1c - Lipid  panel - Comprehensive metabolic panel - Comp Met (CMET); Future  5. Preventative health care ssee above See avs  6. Routine history and physical examination of adult    [name], DO  05/02/2016

## 2016-04-22 NOTE — Patient Instructions (Signed)
Preventive Care for Adults, Female A healthy lifestyle and preventive care can promote health and wellness. Preventive health guidelines for women include the following key practices.  A routine yearly physical is a good way to check with your health care provider about your health and preventive screening. It is a chance to share any concerns and updates on your health and to receive a thorough exam.  Visit your dentist for a routine exam and preventive care every 6 months. Brush your teeth twice a day and floss once a day. Good oral hygiene prevents tooth decay and gum disease.  The frequency of eye exams is based on your age, health, family medical history, use of contact lenses, and other factors. Follow your health care provider's recommendations for frequency of eye exams.  Eat a healthy diet. Foods like vegetables, fruits, whole grains, low-fat dairy products, and lean protein foods contain the nutrients you need without too many calories. Decrease your intake of foods high in solid fats, added sugars, and salt. Eat the right amount of calories for you.Get information about a proper diet from your health care provider, if necessary.  Regular physical exercise is one of the most important things you can do for your health. Most adults should get at least 150 minutes of moderate-intensity exercise (any activity that increases your heart rate and causes you to sweat) each week. In addition, most adults need muscle-strengthening exercises on 2 or more days a week.  Maintain a healthy weight. The body mass index (BMI) is a screening tool to identify possible weight problems. It provides an estimate of body fat based on height and weight. Your health care provider can find your BMI and can help you achieve or maintain a healthy weight.For adults 20 years and older:  A BMI below 18.5 is considered underweight.  A BMI of 18.5 to 24.9 is normal.  A BMI of 25 to 29.9 is considered overweight.  A  BMI of 30 and above is considered obese.  Maintain normal blood lipids and cholesterol levels by exercising and minimizing your intake of saturated fat. Eat a balanced diet with plenty of fruit and vegetables. Blood tests for lipids and cholesterol should begin at age 45 and be repeated every 5 years. If your lipid or cholesterol levels are high, you are over 50, or you are at high risk for heart disease, you may need your cholesterol levels checked more frequently.Ongoing high lipid and cholesterol levels should be treated with medicines if diet and exercise are not working.  If you smoke, find out from your health care provider how to quit. If you do not use tobacco, do not start.  Lung cancer screening is recommended for adults aged 45-80 years who are at high risk for developing lung cancer because of a history of smoking. A yearly low-dose CT scan of the lungs is recommended for people who have at least a 30-pack-year history of smoking and are a current smoker or have quit within the past 15 years. A pack year of smoking is smoking an average of 1 pack of cigarettes a day for 1 year (for example: 1 pack a day for 30 years or 2 packs a day for 15 years). Yearly screening should continue until the smoker has stopped smoking for at least 15 years. Yearly screening should be stopped for people who develop a health problem that would prevent them from having lung cancer treatment.  If you are pregnant, do not drink alcohol. If you are  breastfeeding, be very cautious about drinking alcohol. If you are not pregnant and choose to drink alcohol, do not have more than 1 drink per day. One drink is considered to be 12 ounces (355 mL) of beer, 5 ounces (148 mL) of wine, or 1.5 ounces (44 mL) of liquor.  Avoid use of street drugs. Do not share needles with anyone. Ask for help if you need support or instructions about stopping the use of drugs.  High blood pressure causes heart disease and increases the risk  of stroke. Your blood pressure should be checked at least every 1 to 2 years. Ongoing high blood pressure should be treated with medicines if weight loss and exercise do not work.  If you are 55-79 years old, ask your health care provider if you should take aspirin to prevent strokes.  Diabetes screening is done by taking a blood sample to check your blood glucose level after you have not eaten for a certain period of time (fasting). If you are not overweight and you do not have risk factors for diabetes, you should be screened once every 3 years starting at age 45. If you are overweight or obese and you are 40-70 years of age, you should be screened for diabetes every year as part of your cardiovascular risk assessment.  Breast cancer screening is essential preventive care for women. You should practice "breast self-awareness." This means understanding the normal appearance and feel of your breasts and may include breast self-examination. Any changes detected, no matter how small, should be reported to a health care provider. Women in their 20s and 30s should have a clinical breast exam (CBE) by a health care provider as part of a regular health exam every 1 to 3 years. After age 40, women should have a CBE every year. Starting at age 40, women should consider having a mammogram (breast X-ray test) every year. Women who have a family history of breast cancer should talk to their health care provider about genetic screening. Women at a high risk of breast cancer should talk to their health care providers about having an MRI and a mammogram every year.  Breast cancer gene (BRCA)-related cancer risk assessment is recommended for women who have family members with BRCA-related cancers. BRCA-related cancers include breast, ovarian, tubal, and peritoneal cancers. Having family members with these cancers may be associated with an increased risk for harmful changes (mutations) in the breast cancer genes BRCA1 and  BRCA2. Results of the assessment will determine the need for genetic counseling and BRCA1 and BRCA2 testing.  Your health care provider may recommend that you be screened regularly for cancer of the pelvic organs (ovaries, uterus, and vagina). This screening involves a pelvic examination, including checking for microscopic changes to the surface of your cervix (Pap test). You may be encouraged to have this screening done every 3 years, beginning at age 21.  For women ages 30-65, health care providers may recommend pelvic exams and Pap testing every 3 years, or they may recommend the Pap and pelvic exam, combined with testing for human papilloma virus (HPV), every 5 years. Some types of HPV increase your risk of cervical cancer. Testing for HPV may also be done on women of any age with unclear Pap test results.  Other health care providers may not recommend any screening for nonpregnant women who are considered low risk for pelvic cancer and who do not have symptoms. Ask your health care provider if a screening pelvic exam is right for   you.  If you have had past treatment for cervical cancer or a condition that could lead to cancer, you need Pap tests and screening for cancer for at least 20 years after your treatment. If Pap tests have been discontinued, your risk factors (such as having a new sexual partner) need to be reassessed to determine if screening should resume. Some women have medical problems that increase the chance of getting cervical cancer. In these cases, your health care provider may recommend more frequent screening and Pap tests.  Colorectal cancer can be detected and often prevented. Most routine colorectal cancer screening begins at the age of 50 years and continues through age 75 years. However, your health care provider may recommend screening at an earlier age if you have risk factors for colon cancer. On a yearly basis, your health care provider may provide home test kits to check  for hidden blood in the stool. Use of a small camera at the end of a tube, to directly examine the colon (sigmoidoscopy or colonoscopy), can detect the earliest forms of colorectal cancer. Talk to your health care provider about this at age 50, when routine screening begins. Direct exam of the colon should be repeated every 5-10 years through age 75 years, unless early forms of precancerous polyps or small growths are found.  People who are at an increased risk for hepatitis B should be screened for this virus. You are considered at high risk for hepatitis B if:  You were born in a country where hepatitis B occurs often. Talk with your health care provider about which countries are considered high risk.  Your parents were born in a high-risk country and you have not received a shot to protect against hepatitis B (hepatitis B vaccine).  You have HIV or AIDS.  You use needles to inject street drugs.  You live with, or have sex with, someone who has hepatitis B.  You get hemodialysis treatment.  You take certain medicines for conditions like cancer, organ transplantation, and autoimmune conditions.  Hepatitis C blood testing is recommended for all people born from 1945 through 1965 and any individual with known risks for hepatitis C.  Practice safe sex. Use condoms and avoid high-risk sexual practices to reduce the spread of sexually transmitted infections (STIs). STIs include gonorrhea, chlamydia, syphilis, trichomonas, herpes, HPV, and human immunodeficiency virus (HIV). Herpes, HIV, and HPV are viral illnesses that have no cure. They can result in disability, cancer, and death.  You should be screened for sexually transmitted illnesses (STIs) including gonorrhea and chlamydia if:  You are sexually active and are younger than 24 years.  You are older than 24 years and your health care provider tells you that you are at risk for this type of infection.  Your sexual activity has changed  since you were last screened and you are at an increased risk for chlamydia or gonorrhea. Ask your health care provider if you are at risk.  If you are at risk of being infected with HIV, it is recommended that you take a prescription medicine daily to prevent HIV infection. This is called preexposure prophylaxis (PrEP). You are considered at risk if:  You are sexually active and do not regularly use condoms or know the HIV status of your partner(s).  You take drugs by injection.  You are sexually active with a partner who has HIV.  Talk with your health care provider about whether you are at high risk of being infected with HIV. If   you choose to begin PrEP, you should first be tested for HIV. You should then be tested every 3 months for as long as you are taking PrEP.  Osteoporosis is a disease in which the bones lose minerals and strength with aging. This can result in serious bone fractures or breaks. The risk of osteoporosis can be identified using a bone density scan. Women ages 67 years and over and women at risk for fractures or osteoporosis should discuss screening with their health care providers. Ask your health care provider whether you should take a calcium supplement or vitamin D to reduce the rate of osteoporosis.  Menopause can be associated with physical symptoms and risks. Hormone replacement therapy is available to decrease symptoms and risks. You should talk to your health care provider about whether hormone replacement therapy is right for you.  Use sunscreen. Apply sunscreen liberally and repeatedly throughout the day. You should seek shade when your shadow is shorter than you. Protect yourself by wearing long sleeves, pants, a wide-brimmed hat, and sunglasses year round, whenever you are outdoors.  Once a month, do a whole body skin exam, using a mirror to look at the skin on your back. Tell your health care provider of new moles, moles that have irregular borders, moles that  are larger than a pencil eraser, or moles that have changed in shape or color.  Stay current with required vaccines (immunizations).  Influenza vaccine. All adults should be immunized every year.  Tetanus, diphtheria, and acellular pertussis (Td, Tdap) vaccine. Pregnant women should receive 1 dose of Tdap vaccine during each pregnancy. The dose should be obtained regardless of the length of time since the last dose. Immunization is preferred during the 27th-36th week of gestation. An adult who has not previously received Tdap or who does not know her vaccine status should receive 1 dose of Tdap. This initial dose should be followed by tetanus and diphtheria toxoids (Td) booster doses every 10 years. Adults with an unknown or incomplete history of completing a 3-dose immunization series with Td-containing vaccines should begin or complete a primary immunization series including a Tdap dose. Adults should receive a Td booster every 10 years.  Varicella vaccine. An adult without evidence of immunity to varicella should receive 2 doses or a second dose if she has previously received 1 dose. Pregnant females who do not have evidence of immunity should receive the first dose after pregnancy. This first dose should be obtained before leaving the health care facility. The second dose should be obtained 4-8 weeks after the first dose.  Human papillomavirus (HPV) vaccine. Females aged 13-26 years who have not received the vaccine previously should obtain the 3-dose series. The vaccine is not recommended for use in pregnant females. However, pregnancy testing is not needed before receiving a dose. If a female is found to be pregnant after receiving a dose, no treatment is needed. In that case, the remaining doses should be delayed until after the pregnancy. Immunization is recommended for any person with an immunocompromised condition through the age of 61 years if she did not get any or all doses earlier. During the  3-dose series, the second dose should be obtained 4-8 weeks after the first dose. The third dose should be obtained 24 weeks after the first dose and 16 weeks after the second dose.  Zoster vaccine. One dose is recommended for adults aged 30 years or older unless certain conditions are present.  Measles, mumps, and rubella (MMR) vaccine. Adults born  before 1957 generally are considered immune to measles and mumps. Adults born in 1957 or later should have 1 or more doses of MMR vaccine unless there is a contraindication to the vaccine or there is laboratory evidence of immunity to each of the three diseases. A routine second dose of MMR vaccine should be obtained at least 28 days after the first dose for students attending postsecondary schools, health care workers, or international travelers. People who received inactivated measles vaccine or an unknown type of measles vaccine during 1963-1967 should receive 2 doses of MMR vaccine. People who received inactivated mumps vaccine or an unknown type of mumps vaccine before 1979 and are at high risk for mumps infection should consider immunization with 2 doses of MMR vaccine. For females of childbearing age, rubella immunity should be determined. If there is no evidence of immunity, females who are not pregnant should be vaccinated. If there is no evidence of immunity, females who are pregnant should delay immunization until after pregnancy. Unvaccinated health care workers born before 1957 who lack laboratory evidence of measles, mumps, or rubella immunity or laboratory confirmation of disease should consider measles and mumps immunization with 2 doses of MMR vaccine or rubella immunization with 1 dose of MMR vaccine.  Pneumococcal 13-valent conjugate (PCV13) vaccine. When indicated, a person who is uncertain of his immunization history and has no record of immunization should receive the PCV13 vaccine. All adults 65 years of age and older should receive this  vaccine. An adult aged 19 years or older who has certain medical conditions and has not been previously immunized should receive 1 dose of PCV13 vaccine. This PCV13 should be followed with a dose of pneumococcal polysaccharide (PPSV23) vaccine. Adults who are at high risk for pneumococcal disease should obtain the PPSV23 vaccine at least 8 weeks after the dose of PCV13 vaccine. Adults older than 70 years of age who have normal immune system function should obtain the PPSV23 vaccine dose at least 1 year after the dose of PCV13 vaccine.  Pneumococcal polysaccharide (PPSV23) vaccine. When PCV13 is also indicated, PCV13 should be obtained first. All adults aged 65 years and older should be immunized. An adult younger than age 65 years who has certain medical conditions should be immunized. Any person who resides in a nursing home or long-term care facility should be immunized. An adult smoker should be immunized. People with an immunocompromised condition and certain other conditions should receive both PCV13 and PPSV23 vaccines. People with human immunodeficiency virus (HIV) infection should be immunized as soon as possible after diagnosis. Immunization during chemotherapy or radiation therapy should be avoided. Routine use of PPSV23 vaccine is not recommended for American Indians, [place] Natives, or people younger than 65 years unless there are medical conditions that require PPSV23 vaccine. When indicated, people who have unknown immunization and have no record of immunization should receive PPSV23 vaccine. One-time revaccination 5 years after the first dose of PPSV23 is recommended for people aged 19-64 years who have chronic kidney failure, nephrotic syndrome, asplenia, or immunocompromised conditions. People who received 1-2 doses of PPSV23 before age 65 years should receive another dose of PPSV23 vaccine at age 65 years or later if at least 5 years have passed since the previous dose. Doses of PPSV23 are not  needed for people immunized with PPSV23 at or after age 65 years.  Meningococcal vaccine. Adults with asplenia or persistent complement component deficiencies should receive 2 doses of quadrivalent meningococcal conjugate (MenACWY-D) vaccine. The doses should be obtained   at least 2 months apart. Microbiologists working with certain meningococcal bacteria, [place] recruits, people at risk during an outbreak, and people who travel to or live in countries with a high rate of meningitis should be immunized. A first-year college student up through age 34 years who is living in a residence hall should receive a dose if she did not receive a dose on or after her 16th birthday. Adults who have certain high-risk conditions should receive one or more doses of vaccine.  Hepatitis A vaccine. Adults who wish to be protected from this disease, have certain high-risk conditions, work with hepatitis A-infected animals, work in hepatitis A research labs, or travel to or work in countries with a high rate of hepatitis A should be immunized. Adults who were previously unvaccinated and who anticipate close contact with an international adoptee during the first 60 days after arrival in the [place] from a country with a high rate of hepatitis A should be immunized.  Hepatitis B vaccine. Adults who wish to be protected from this disease, have certain high-risk conditions, may be exposed to blood or other infectious body fluids, are household contacts or sex partners of hepatitis B positive people, are clients or workers in certain care facilities, or travel to or work in countries with a high rate of hepatitis B should be immunized.  Haemophilus influenzae type b (Hib) vaccine. A previously unvaccinated person with asplenia or sickle cell disease or having a scheduled splenectomy should receive 1 dose of Hib vaccine. Regardless of previous immunization, a recipient of a hematopoietic stem cell transplant should receive a  3-dose series 6-12 months after her successful transplant. Hib vaccine is not recommended for adults with HIV infection. Preventive Services / Frequency Ages 35 to 4 years  Blood pressure check.** / Every 3-5 years.  Lipid and cholesterol check.** / Every 5 years beginning at age 60.  Clinical breast exam.** / Every 3 years for women in their 71s and 10s.  BRCA-related cancer risk assessment.** / For women who have family members with a BRCA-related cancer (breast, ovarian, tubal, or peritoneal cancers).  Pap test.** / Every 2 years from ages 76 through 26. Every 3 years starting at age 61 through age 76 or 93 with a history of 3 consecutive normal Pap tests.  HPV screening.** / Every 3 years from ages 37 through ages 60 to 51 with a history of 3 consecutive normal Pap tests.  Hepatitis C blood test.** / For any individual with known risks for hepatitis C.  Skin self-exam. / Monthly.  Influenza vaccine. / Every year.  Tetanus, diphtheria, and acellular pertussis (Tdap, Td) vaccine.** / Consult your health care provider. Pregnant women should receive 1 dose of Tdap vaccine during each pregnancy. 1 dose of Td every 10 years.  Varicella vaccine.** / Consult your health care provider. Pregnant females who do not have evidence of immunity should receive the first dose after pregnancy.  HPV vaccine. / 3 doses over 6 months, if 93 and younger. The vaccine is not recommended for use in pregnant females. However, pregnancy testing is not needed before receiving a dose.  Measles, mumps, rubella (MMR) vaccine.** / You need at least 1 dose of MMR if you were born in 1957 or later. You may also need a 2nd dose. For females of childbearing age, rubella immunity should be determined. If there is no evidence of immunity, females who are not pregnant should be vaccinated. If there is no evidence of immunity, females who are  pregnant should delay immunization until after pregnancy.  Pneumococcal  13-valent conjugate (PCV13) vaccine.** / Consult your health care provider.  Pneumococcal polysaccharide (PPSV23) vaccine.** / 1 to 2 doses if you smoke cigarettes or if you have certain conditions.  Meningococcal vaccine.** / 1 dose if you are age 68 to 8 years and a Market researcher living in a residence hall, or have one of several medical conditions, you need to get vaccinated against meningococcal disease. You may also need additional booster doses.  Hepatitis A vaccine.** / Consult your health care provider.  Hepatitis B vaccine.** / Consult your health care provider.  Haemophilus influenzae type b (Hib) vaccine.** / Consult your health care provider. Ages 7 to 53 years  Blood pressure check.** / Every year.  Lipid and cholesterol check.** / Every 5 years beginning at age 25 years.  Lung cancer screening. / Every year if you are aged 11-80 years and have a 30-pack-year history of smoking and currently smoke or have quit within the past 15 years. Yearly screening is stopped once you have quit smoking for at least 15 years or develop a health problem that would prevent you from having lung cancer treatment.  Clinical breast exam.** / Every year after age 48 years.  BRCA-related cancer risk assessment.** / For women who have family members with a BRCA-related cancer (breast, ovarian, tubal, or peritoneal cancers).  Mammogram.** / Every year beginning at age 41 years and continuing for as long as you are in good health. Consult with your health care provider.  Pap test.** / Every 3 years starting at age 65 years through age 37 or 70 years with a history of 3 consecutive normal Pap tests.  HPV screening.** / Every 3 years from ages 72 years through ages 60 to 40 years with a history of 3 consecutive normal Pap tests.  Fecal occult blood test (FOBT) of stool. / Every year beginning at age 21 years and continuing until age 5 years. You may not need to do this test if you get  a colonoscopy every 10 years.  Flexible sigmoidoscopy or colonoscopy.** / Every 5 years for a flexible sigmoidoscopy or every 10 years for a colonoscopy beginning at age 35 years and continuing until age 48 years.  Hepatitis C blood test.** / For all people born from [birth year] through 1965 and any individual with known risks for hepatitis C.  Skin self-exam. / Monthly.  Influenza vaccine. / Every year.  Tetanus, diphtheria, and acellular pertussis (Tdap/Td) vaccine.** / Consult your health care provider. Pregnant women should receive 1 dose of Tdap vaccine during each pregnancy. 1 dose of Td every 10 years.  Varicella vaccine.** / Consult your health care provider. Pregnant females who do not have evidence of immunity should receive the first dose after pregnancy.  Zoster vaccine.** / 1 dose for adults aged 30 years or older.  Measles, mumps, rubella (MMR) vaccine.** / You need at least 1 dose of MMR if you were born in 1957 or later. You may also need a second dose. For females of childbearing age, rubella immunity should be determined. If there is no evidence of immunity, females who are not pregnant should be vaccinated. If there is no evidence of immunity, females who are pregnant should delay immunization until after pregnancy.  Pneumococcal 13-valent conjugate (PCV13) vaccine.** / Consult your health care provider.  Pneumococcal polysaccharide (PPSV23) vaccine.** / 1 to 2 doses if you smoke cigarettes or if you have certain conditions.  Meningococcal vaccine.** /  Consult your health care provider.  Hepatitis A vaccine.** / Consult your health care provider.  Hepatitis B vaccine.** / Consult your health care provider.  Haemophilus influenzae type b (Hib) vaccine.** / Consult your health care provider. Ages 64 years and over  Blood pressure check.** / Every year.  Lipid and cholesterol check.** / Every 5 years beginning at age 23 years.  Lung cancer screening. / Every year if you  are aged 16-80 years and have a 30-pack-year history of smoking and currently smoke or have quit within the past 15 years. Yearly screening is stopped once you have quit smoking for at least 15 years or develop a health problem that would prevent you from having lung cancer treatment.  Clinical breast exam.** / Every year after age 74 years.  BRCA-related cancer risk assessment.** / For women who have family members with a BRCA-related cancer (breast, ovarian, tubal, or peritoneal cancers).  Mammogram.** / Every year beginning at age 44 years and continuing for as long as you are in good health. Consult with your health care provider.  Pap test.** / Every 3 years starting at age 58 years through age 22 or 39 years with 3 consecutive normal Pap tests. Testing can be stopped between 65 and 70 years with 3 consecutive normal Pap tests and no abnormal Pap or HPV tests in the past 10 years.  HPV screening.** / Every 3 years from ages 64 years through ages 70 or 61 years with a history of 3 consecutive normal Pap tests. Testing can be stopped between 65 and 70 years with 3 consecutive normal Pap tests and no abnormal Pap or HPV tests in the past 10 years.  Fecal occult blood test (FOBT) of stool. / Every year beginning at age 40 years and continuing until age 27 years. You may not need to do this test if you get a colonoscopy every 10 years.  Flexible sigmoidoscopy or colonoscopy.** / Every 5 years for a flexible sigmoidoscopy or every 10 years for a colonoscopy beginning at age 7 years and continuing until age 32 years.  Hepatitis C blood test.** / For all people born from [birth year] through 1965 and any individual with known risks for hepatitis C.  Osteoporosis screening.** / A one-time screening for women ages 30 years and over and women at risk for fractures or osteoporosis.  Skin self-exam. / Monthly.  Influenza vaccine. / Every year.  Tetanus, diphtheria, and acellular pertussis (Tdap/Td)  vaccine.** / 1 dose of Td every 10 years.  Varicella vaccine.** / Consult your health care provider.  Zoster vaccine.** / 1 dose for adults aged 35 years or older.  Pneumococcal 13-valent conjugate (PCV13) vaccine.** / Consult your health care provider.  Pneumococcal polysaccharide (PPSV23) vaccine.** / 1 dose for all adults aged 46 years and older.  Meningococcal vaccine.** / Consult your health care provider.  Hepatitis A vaccine.** / Consult your health care provider.  Hepatitis B vaccine.** / Consult your health care provider.  Haemophilus influenzae type b (Hib) vaccine.** / Consult your health care provider. ** Family history and personal history of risk and conditions may change your health care provider's recommendations.   This information is not intended to replace advice given to you by your health care provider. Make sure you discuss any questions you have with your health care provider.   Document Released: 01/11/2002 Document Revised: 12/06/2014 Document Reviewed: 04/12/2011 Elsevier Interactive Patient Education [employer].

## 2016-04-22 NOTE — Progress Notes (Signed)
Pre visit review using our clinic review tool, if applicable. No additional management support is needed unless otherwise documented below in the visit note. 

## 2016-04-23 LAB — CBC WITH DIFFERENTIAL/PLATELET
BASOS ABS: 0 10*3/uL (ref 0.0–0.1)
Basophils Relative: 0.6 % (ref 0.0–3.0)
EOS PCT: 0.8 % (ref 0.0–5.0)
Eosinophils Absolute: 0.1 10*3/uL (ref 0.0–0.7)
HCT: 42.7 % (ref 36.0–46.0)
HEMOGLOBIN: 14.4 g/dL (ref 12.0–15.0)
LYMPHS ABS: 1.8 10*3/uL (ref 0.7–4.0)
Lymphocytes Relative: 26.1 % (ref 12.0–46.0)
MCHC: 33.8 g/dL (ref 30.0–36.0)
MCV: 96.9 fl (ref 78.0–100.0)
MONO ABS: 0.6 10*3/uL (ref 0.1–1.0)
Monocytes Relative: 8.7 % (ref 3.0–12.0)
NEUTROS PCT: 63.8 % (ref 43.0–77.0)
Neutro Abs: 4.5 10*3/uL (ref 1.4–7.7)
Platelets: 147 10*3/uL — ABNORMAL LOW (ref 150.0–400.0)
RBC: 4.4 Mil/uL (ref 3.87–5.11)
RDW: 13.6 % (ref 11.5–15.5)
WBC: 7.1 10*3/uL (ref 4.0–10.5)

## 2016-04-23 LAB — HEPATITIS C ANTIBODY: HCV AB: REACTIVE — AB

## 2016-04-23 LAB — HEMOGLOBIN A1C: HEMOGLOBIN A1C: 8.1 % — AB (ref 4.6–6.5)

## 2016-04-26 ENCOUNTER — Other Ambulatory Visit: Payer: Self-pay | Admitting: Family Medicine

## 2016-04-27 LAB — HEPATITIS C RNA QUANTITATIVE: HCV QUANT: NOT DETECTED [IU]/mL (ref <15)

## 2016-04-27 MED ORDER — GLIPIZIDE ER 5 MG PO TB24: 5.0000 mg | ORAL_TABLET | Freq: Two times a day (BID) | ORAL | Status: DC

## 2016-04-27 NOTE — Telephone Encounter (Signed)
Relation to [id number] Call back [place]: [id number] [address], [place] (Phone) [phone] (Fax)         Reason for call:  Patient calling checking on the status of ALPRAZolam (XANAX) 0.5 MG tablet as per patient pharmacy never received.

## 2016-04-28 NOTE — Telephone Encounter (Signed)
[name] took the Rx over the phone.    KP

## 2016-05-02 MED ORDER — FENOFIBRATE 160 MG PO TABS: ORAL_TABLET | ORAL | Status: DC

## 2016-05-02 MED ORDER — CITALOPRAM HYDROBROMIDE 40 MG PO TABS: 40.0000 mg | ORAL_TABLET | Freq: Every day | ORAL | Status: DC

## 2016-05-02 MED ORDER — GLIPIZIDE ER 5 MG PO TB24: 5.0000 mg | ORAL_TABLET | Freq: Two times a day (BID) | ORAL | Status: DC

## 2016-05-02 MED ORDER — CARVEDILOL 25 MG PO TABS: ORAL_TABLET | ORAL | Status: DC

## 2016-05-02 MED ORDER — ROSUVASTATIN CALCIUM 20 MG PO TABS: 20.0000 mg | ORAL_TABLET | Freq: Every day | ORAL | Status: DC

## 2016-05-10 NOTE — Telephone Encounter (Addendum)
Relation to [id number] Call back number:[phone]   Reason for call:  Patient states she received a bill stating $50 no show will be turned over to collection if not paid. Patient received bill on Saturday and would like a follow up call informing her bill will be waived. Please advise

## 2016-05-18 ENCOUNTER — Other Ambulatory Visit: Payer: Self-pay | Admitting: Family Medicine

## 2016-05-21 ENCOUNTER — Emergency Department (HOSPITAL_BASED_OUTPATIENT_CLINIC_OR_DEPARTMENT_OTHER): Payer: Medicare Other

## 2016-05-21 ENCOUNTER — Emergency Department (HOSPITAL_BASED_OUTPATIENT_CLINIC_OR_DEPARTMENT_OTHER)
Admission: EM | Admit: 2016-05-21 | Discharge: 2016-05-22 | Disposition: A | Discharge status: Home / Self Care | Payer: Medicare Other | Attending: Emergency Medicine | Admitting: Emergency Medicine

## 2016-05-21 ENCOUNTER — Encounter (HOSPITAL_BASED_OUTPATIENT_CLINIC_OR_DEPARTMENT_OTHER): Payer: Self-pay | Admitting: *Deleted

## 2016-05-21 DIAGNOSIS — Z79899 Other long term (current) drug therapy: Secondary | ICD-10-CM | POA: Insufficient documentation

## 2016-05-21 DIAGNOSIS — I10 Essential (primary) hypertension: Secondary | ICD-10-CM | POA: Diagnosis not present

## 2016-05-21 DIAGNOSIS — R109 Unspecified abdominal pain: Secondary | ICD-10-CM | POA: Diagnosis not present

## 2016-05-21 DIAGNOSIS — N132 Hydronephrosis with renal and ureteral calculous obstruction: Secondary | ICD-10-CM | POA: Diagnosis not present

## 2016-05-21 DIAGNOSIS — R52 Pain, unspecified: Secondary | ICD-10-CM

## 2016-05-21 DIAGNOSIS — N200 Calculus of kidney: Secondary | ICD-10-CM | POA: Insufficient documentation

## 2016-05-21 DIAGNOSIS — E119 Type 2 diabetes mellitus without complications: Secondary | ICD-10-CM | POA: Diagnosis not present

## 2016-05-21 DIAGNOSIS — Z7984 Long term (current) use of oral hypoglycemic drugs: Secondary | ICD-10-CM | POA: Diagnosis not present

## 2016-05-21 DIAGNOSIS — Z87891 Personal history of nicotine dependence: Secondary | ICD-10-CM | POA: Diagnosis not present

## 2016-05-21 DIAGNOSIS — M545 Low back pain: Secondary | ICD-10-CM | POA: Diagnosis present

## 2016-05-21 DIAGNOSIS — I7101 Dissection of thoracic aorta: Secondary | ICD-10-CM | POA: Insufficient documentation

## 2016-05-21 DIAGNOSIS — E785 Hyperlipidemia, unspecified: Secondary | ICD-10-CM | POA: Diagnosis not present

## 2016-05-21 LAB — BASIC METABOLIC PANEL
Anion gap: 9 (ref 5–15)
BUN: 20 mg/dL (ref 6–20)
CALCIUM: 9 mg/dL (ref 8.9–10.3)
CO2: 24 mmol/L (ref 22–32)
CREATININE: 0.97 mg/dL (ref 0.44–1.00)
Chloride: 104 mmol/L (ref 101–111)
GFR calc non Af Amer: 58 mL/min — ABNORMAL LOW (ref >60)
GLUCOSE: 144 mg/dL — AB (ref 65–99)
Potassium: 4.4 mmol/L (ref 3.5–5.1)
Sodium: 137 mmol/L (ref 135–145)

## 2016-05-21 LAB — CBC WITH DIFFERENTIAL/PLATELET
Basophils Absolute: 0 10*3/uL (ref 0.0–0.1)
Basophils Relative: 0 %
EOS PCT: 0 %
Eosinophils Absolute: 0 10*3/uL (ref 0.0–0.7)
HEMATOCRIT: 39.6 % (ref 36.0–46.0)
Hemoglobin: 13.6 g/dL (ref 12.0–15.0)
LYMPHS ABS: 1.1 10*3/uL (ref 0.7–4.0)
Lymphocytes Relative: 10 %
MCH: 33.4 pg (ref 26.0–34.0)
MCHC: 34.3 g/dL (ref 30.0–36.0)
MCV: 97.3 fL (ref 78.0–100.0)
MONO ABS: 0.8 10*3/uL (ref 0.1–1.0)
Monocytes Relative: 7 %
Neutro Abs: 9.2 10*3/uL — ABNORMAL HIGH (ref 1.7–7.7)
Neutrophils Relative %: 83 %
Platelets: 110 10*3/uL — ABNORMAL LOW (ref 150–400)
RBC: 4.07 MIL/uL (ref 3.87–5.11)
RDW: 12.8 % (ref 11.5–15.5)
WBC: 11.1 10*3/uL — AB (ref 4.0–10.5)

## 2016-05-21 LAB — URINE MICROSCOPIC-ADD ON

## 2016-05-21 LAB — PROTIME-INR
INR: 1.27 (ref 0.00–1.49)
Prothrombin Time: 16 seconds — ABNORMAL HIGH (ref 11.6–15.2)

## 2016-05-21 LAB — URINALYSIS, ROUTINE W REFLEX MICROSCOPIC
BILIRUBIN URINE: NEGATIVE
Glucose, UA: NEGATIVE mg/dL
KETONES UR: NEGATIVE mg/dL
NITRITE: NEGATIVE
PH: 6 (ref 5.0–8.0)
Protein, ur: NEGATIVE mg/dL
Specific Gravity, Urine: 1.024 (ref 1.005–1.030)

## 2016-05-21 LAB — APTT: APTT: 33 s (ref 24–37)

## 2016-05-21 MED ORDER — IOPAMIDOL (ISOVUE-370) INJECTION 76%
100.0000 mL | Freq: Once | INTRAVENOUS | Status: AC | PRN
  Administered 2016-05-21: 100 mL via INTRAVENOUS

## 2016-05-21 MED ORDER — FLUCONAZOLE 200 MG PO TABS
200.0000 mg | ORAL_TABLET | Freq: Once | ORAL | Status: AC
Start: 2016-05-21 — End: 2016-05-28

## 2016-05-21 MED ORDER — MORPHINE SULFATE (PF) 4 MG/ML IV SOLN
4.0000 mg | Freq: Once | INTRAVENOUS | Status: AC
  Administered 2016-05-21: 4 mg via INTRAVENOUS
  Filled 2016-05-21: qty 1

## 2016-05-21 MED ORDER — CEFPODOXIME PROXETIL 100 MG PO TABS: 100.0000 mg | ORAL_TABLET | Freq: Two times a day (BID) | ORAL | Status: DC

## 2016-05-21 MED ORDER — ONDANSETRON HCL 4 MG/2ML IJ SOLN
4.0000 mg | Freq: Once | INTRAMUSCULAR | Status: AC
  Administered 2016-05-21: 4 mg via INTRAVENOUS
  Filled 2016-05-21: qty 2

## 2016-05-21 MED ORDER — ONDANSETRON 4 MG PO TBDP: ORAL_TABLET | ORAL | Status: DC

## 2016-05-21 MED ORDER — ACETAMINOPHEN 500 MG PO TABS
1000.0000 mg | ORAL_TABLET | Freq: Once | ORAL | Status: AC
  Administered 2016-05-21: 1000 mg via ORAL
  Filled 2016-05-21: qty 2

## 2016-05-21 MED ORDER — OXYCODONE HCL 5 MG PO TABS: 2.5000 mg | ORAL_TABLET | ORAL | Status: DC | PRN

## 2016-05-21 MED ORDER — MORPHINE SULFATE (PF) 2 MG/ML IV SOLN
2.0000 mg | Freq: Once | INTRAVENOUS | Status: AC
  Administered 2016-05-21: 2 mg via INTRAVENOUS
  Filled 2016-05-21: qty 1

## 2016-05-21 MED ORDER — TAMSULOSIN HCL 0.4 MG PO CAPS: 0.4000 mg | ORAL_CAPSULE | Freq: Every day | ORAL | Status: DC

## 2016-05-21 NOTE — ED Provider Notes (Signed)
CSN: [id number]     Arrival date & time 05/21/16  1853 History  By signing my name below, I, [name], attest that this documentation has been prepared under the direction and in the presence of Anna Etienne, DO.  Electronically signed: Roxine [name], ED Scribe. 05/21/2016. 9:10 PM.   Chief Complaint  Patient presents with  . Back Pain   The history is provided by the patient. No language interpreter was used.   HPI Comments: [name] is a 70 y.o. female with PMHx of diverticulitis, kidney stones and 2 aortic dissections who presents to the Emergency Department complaining of sudden onset, constant lower right back pain around 3 pm today. Pt states she was sitting down when pain began. She notes pain is exacerbated when laying flat, laughing, coughing, or standing. Pt reports that back pain radiates to abdomen when laying flat. She also notes the abdomen pain makes her feel as though she needs to urinate. Pt states pain is similar to pain she felt from her aortic dissections except in the lower back. Pt reports taking 2 tramadol with no relief. Pt states she knows she has a kidney stone which has been seen in imaging by Dr.Gerhardt. Pt denies kidney problems or other symptoms. Pt has a Psychologist, forensic in place.  Past Medical History  Diagnosis Date  . Anxiety   . Hyperlipidemia   . Hypertension   . Diabetes mellitus   . GERD (gastroesophageal reflux disease)   . Cancer ([place])     skin  . Kidney stones   . Diverticulosis   . Hemorrhoids   . Acute thoracic aortic dissection [facility]) 2008    emergency surgery - [name]  . Sinus node dysfunction ([facility])     hx of PPM   Past Surgical History  Procedure Laterality Date  . Pacemaker insertion  08/2006    dual chamber Medtronic EnRhythm; r/t sinus node dysfunction   . Appendectomy    . Abdominal hysterectomy  1984  . Thoracic aortic aneurysm repair  2000    type III  . Repair of acute ascending thoracic aortic dissection  2008    Dr.  Servando Snare  . Transthoracic echocardiogram  09/20/2012    EF=>55% with mild conc LVH; LA mod dilated; RA mildly dilated; mild MR/TR/AR  . Pacemaker generator change  2014    Medtronic adapta  . Eye surgery  2014    Rght eye  . Pacemaker generator change N/A 08/29/2013    Procedure: PACEMAKER GENERATOR CHANGE;  Surgeon: Sanda Klein, MD;  Location: [facility] CATH [facility];  Service: Cardiovascular;  Laterality: N/A;   Family History  Problem Relation Age of Onset  . Heart attack Father   . Cancer Neg Hx   . Kidney disease Mother   . Heart attack Maternal Grandmother   . Valvular heart disease Son     valve replacement at 41   Social History  Substance Use Topics  . Smoking status: Former Smoker    Quit date: 09/04/1993  . Smokeless tobacco: Never Used  . Alcohol Use: No   OB History    No data available     Review of Systems  Constitutional: Negative for fever and chills.  HENT: Negative for congestion and rhinorrhea.   Eyes: Negative for redness and visual disturbance.  Respiratory: Negative for shortness of breath and wheezing.   Cardiovascular: Negative for chest pain and palpitations.  Gastrointestinal: Positive for abdominal pain. Negative for nausea and vomiting.  Genitourinary: Positive for urgency.  Negative for dysuria.  Musculoskeletal: Positive for back pain. Negative for myalgias and arthralgias.  Skin: Negative for pallor and wound.  Neurological: Negative for dizziness and headaches.    Allergies  Oxycodone; Vicodin; Neomycin-bacitracin zn-polymyx; and Sulfonamide derivatives  Home Medications   Prior to Admission medications   Medication Sig Start Date End Date Taking? Authorizing Provider  ALPRAZolam (XANAX) 0.5 MG tablet TAKE 1 TABLET BY MOUTH 3 TIMES A DAY AS NEEDED FOR ANXIETY 04/28/16   Anna Apa Lowne Chase, DO  carvedilol (COREG) 25 MG tablet TAKE 1/2 TABLET BY MOUTH EVERY MORNING AND 1 TABLET IN EVENING 05/02/16   Anna Chessman Chase, DO  cholecalciferol  (VITAMIN D) 1000 UNITS tablet Take 1,000 Units by mouth daily.    Historical Provider, MD  citalopram (CELEXA) 40 MG tablet Take 1 tablet (40 mg total) by mouth daily. 05/02/16   Yvonne R Lowne Chase, DO  fenofibrate 160 MG tablet TAKE 1 TABLET (160 MG TOTAL) BY MOUTH DAILY. 05/18/16   Anna Apa Lowne Chase, DO  fluticasone (FLONASE) 50 MCG/ACT nasal spray Place 2 sprays into both nostrils daily. Patient taking differently: Place 2 sprays into both nostrils daily as needed.  01/14/16   Anna Chessman Chase, DO  furosemide (LASIX) 40 MG tablet Take 1 tablet (40 mg total) by mouth daily. 12/22/15   Anna Croitoru, MD  glipiZIDE (GLUCOTROL XL) 5 MG 24 hr tablet Take 1 tablet (5 mg total) by mouth 2 (two) times daily. 05/02/16   Yvonne R Lowne Chase, DO  hyoscyamine (LEVSIN SL) 0.125 MG SL tablet TAKE 1 TABLET BY MOUTH TWICE A DAY AS NEEDED FOR CRAMPING OR DIARRHEA OR LOOSE STOOLS 01/29/16   Yvonne R Lowne Chase, DO  JANUVIA 100 MG tablet TAKE 1 TABLET (100 MG TOTAL) BY MOUTH DAILY. 01/26/16   Anna Kingdom, MD  lansoprazole (PREVACID) 15 MG capsule Take 15 mg by mouth as needed (for heart burn).     Historical Provider, MD  Polyethyl Glycol-Propyl Glycol (SYSTANE OP) Apply 1 drop to eye 4 (four) times daily - after meals and at bedtime.    Historical Provider, MD  Probiotic Product (PRO-BIOTIC BLEND PO) Take 1 tablet by mouth daily.    Historical Provider, MD  rosuvastatin (CRESTOR) 20 MG tablet Take 1 tablet (20 mg total) by mouth daily. 05/02/16   [place], DO  Tdap (BOOSTRIX) 5-2.5-18.5 LF-MCG/0.5 injection Inject 0.5 mLs into the muscle once. 04/22/16   Yvonne R Lowne Chase, DO  XARELTO 20 MG TABS tablet TAKE 1 TABLET BY MOUTH DAILY WITH SUPPER 04/06/16   Anna Croitoru, MD   BP 155/73 mmHg  Pulse 76  Temp(Src) 98.6 F (37 C)  Resp 16  Ht 5' (1.524 m)  Wt 206 lb (93.441 kg)  BMI 40.23 kg/m2  SpO2 98% Physical Exam  Constitutional: She is oriented to person, place, and time. She appears  well-developed and well-nourished. No distress.  HENT:  Head: Normocephalic and atraumatic.  Eyes: EOM are normal. Pupils are equal, round, and reactive to light.  Neck: Normal range of motion. Neck supple.  Cardiovascular: Normal rate and regular rhythm.  Exam reveals no gallop and no friction rub.   No murmur heard. Pulmonary/Chest: Effort normal. She has no wheezes. She has no rales.  Abdominal: Soft. She exhibits no distension. There is no tenderness. There is CVA tenderness.  No abdominal tenderness. Tenderness about right CVA.   Musculoskeletal: She exhibits no edema or tenderness.  Intact and equal lower  extremity pulses  Neurological: She is alert and oriented to person, place, and time.  Skin: Skin is warm and dry. She is not diaphoretic.  Psychiatric: She has a normal mood and affect. Her behavior is normal.  Nursing note and vitals reviewed.   ED Course  Procedures  DIAGNOSTIC STUDIES: Oxygen Saturation is 98% on RA, normal by my interpretation.  COORDINATION OF CARE: 8:52 PM Discussed treatment plan which includes CT Angio chest aorta, administering morphine, Zofran injection, and one 1,000 mg Tylenol tablet with pt at bedside and pt agreed to plan.  Labs Review Labs Reviewed - No data to display  Imaging Review No results found. I have personally reviewed and evaluated these images and lab results as part of my medical decision-making.   EKG Interpretation None      MDM   Final diagnoses:  None    70 yo F With acute onset right flank pain. Patient states this feels like the last time she had an aortic dissection. CT angiogram with no dissection however she has a right 4 mm kidney stone. She also has a urinary tract infection. Her pain is well-controlled. She is afebrile. She has no white count. She has a husband who is critically ill and she is requesting discharge. Will treat her pain nausea and give her prescription for Vantin.  11:29 PM:  I have  discussed the diagnosis/risks/treatment options with the patient and family and believe the pt to be eligible for discharge home to follow-up with Urology. We also discussed returning to the ED immediately if new or worsening sx occur. We discussed the sx which are most concerning (e.g., sudden worsening pain, fever, inability to tolerate by mouth) that necessitate immediate return. Medications administered to the patient during their visit and any new prescriptions provided to the patient are listed below.  Medications given during this visit Medications  morphine 2 MG/ML injection 2 mg (2 mg Intravenous Given 05/21/16 2123)  acetaminophen (TYLENOL) tablet 1,000 mg (1,000 mg Oral Given 05/21/16 2123)  ondansetron (ZOFRAN) injection 4 mg (4 mg Intravenous Given 05/21/16 2123)  iopamidol (ISOVUE-370) 76 % injection 100 mL (100 mLs Intravenous Contrast Given 05/21/16 2157)  morphine 4 MG/ML injection 4 mg (4 mg Intravenous Given 05/21/16 2223)    New Prescriptions   CEFPODOXIME (VANTIN) 100 MG TABLET    Take 1 tablet (100 mg total) by mouth 2 (two) times daily.   OXYCODONE (ROXICODONE) 5 MG IMMEDIATE RELEASE TABLET    Take 0.5-1 tablets (2.5-5 mg total) by mouth every 4 (four) hours as needed for severe pain.    The patient appears reasonably screen and/or stabilized for discharge and I doubt any other medical condition or other [facility] requiring further screening, evaluation, or treatment in the ED at this time prior to discharge.    I personally performed the services described in this documentation, which was scribed in my presence. The recorded information has been reviewed and is accurate.    Anna Etienne, DO 05/21/16 2330

## 2016-05-21 NOTE — ED Notes (Signed)
Pt c/o lower back pain which radiates into right hip x 2 hrs PTA 2 tramadol w/o relief

## 2016-05-21 NOTE — Discharge Instructions (Signed)
Take 2 over-the-counter naproxen tablets twice a day for pain. Tylenol 2 tabs four times a day.  Then take the pain medicine as you need it. Return for fever and lethargy feeling terrible or uncontrolled pain   Kidney Stones Kidney stones (urolithiasis) are solid masses that form inside your kidneys. The intense pain is caused by the stone moving through the kidney, ureter, bladder, and urethra (urinary tract). When the stone moves, the ureter starts to spasm around the stone. The stone is usually passed in your pee (urine).  HOME CARE  Drink enough fluids to keep your pee clear or pale yellow. This helps to get the stone out.  Take a 24-hour pee (urine) sample as told by your doctor. You may need to take another sample every 6-12 months.  Strain all pee through the provided strainer. Do not pee without peeing through the strainer, not even once. If you pee the stone out, catch it in the strainer. The stone may be as small as a grain of salt. Take this to your doctor. This will help your doctor figure out what you can do to try to prevent more kidney stones.  Only take medicine as told by your doctor.  Make changes to your daily diet as told by your doctor. You may be told to:  Limit how much salt you eat.  Eat 5 or more servings of fruits and vegetables each day.  Limit how much meat, poultry, fish, and eggs you eat.  Keep all follow-up visits as told by your doctor. This is important.  Get follow-up X-rays as told by your doctor. GET HELP IF: You have pain that gets worse even if you have been taking pain medicine. GET HELP RIGHT AWAY IF:   Your pain does not get better with medicine.  You have a fever or shaking chills.  Your pain increases and gets worse over 18 hours.  You have new belly (abdominal) pain.  You feel faint or pass out.  You are unable to pee.   This information is not intended to replace advice given to you by your health care provider. Make sure you  discuss any questions you have with your health care provider.   Document Released: 05/03/2008 Document Revised: 08/06/2015 Document Reviewed: 04/18/2013 Elsevier Interactive Patient Education [employer].

## 2016-05-22 ENCOUNTER — Other Ambulatory Visit: Payer: Self-pay | Admitting: Internal Medicine

## 2016-05-23 LAB — URINE CULTURE

## 2016-06-04 ENCOUNTER — Other Ambulatory Visit: Payer: Self-pay | Admitting: Family Medicine

## 2016-06-08 ENCOUNTER — Other Ambulatory Visit: Payer: Self-pay | Admitting: Family Medicine

## 2016-06-10 NOTE — Telephone Encounter (Signed)
Last seen 04/22/16 and filled 04/28/16 #90 with 1    Please advise   KP

## 2016-06-26 ENCOUNTER — Other Ambulatory Visit: Payer: Self-pay | Admitting: Family Medicine

## 2016-06-29 ENCOUNTER — Other Ambulatory Visit: Payer: Self-pay | Admitting: Family Medicine

## 2016-06-30 NOTE — Telephone Encounter (Signed)
Rx faxed 06/10/16 #90 with 1

## 2016-07-04 ENCOUNTER — Other Ambulatory Visit: Payer: Self-pay | Admitting: Internal Medicine

## 2016-07-05 ENCOUNTER — Other Ambulatory Visit: Payer: Self-pay | Admitting: Family Medicine

## 2016-07-15 ENCOUNTER — Other Ambulatory Visit: Payer: Self-pay | Admitting: Family Medicine

## 2016-07-29 ENCOUNTER — Other Ambulatory Visit: Payer: Self-pay | Admitting: Family Medicine

## 2016-08-07 ENCOUNTER — Other Ambulatory Visit: Payer: Self-pay | Admitting: Internal Medicine

## 2016-08-09 NOTE — Telephone Encounter (Signed)
Pt needs [name] to [facility]  Pt is aware she has not been in but her husband just passed after many months of being critically ill and she has not been able to leave him

## 2016-08-26 ENCOUNTER — Other Ambulatory Visit: Payer: Self-pay | Admitting: Family Medicine

## 2016-08-26 NOTE — Telephone Encounter (Signed)
Last seen 04/22/16 and filled 06/10/16 #90 with 1 rf    Please advise     KP

## 2016-08-29 ENCOUNTER — Other Ambulatory Visit: Payer: Self-pay | Admitting: Family Medicine

## 2016-08-30 ENCOUNTER — Other Ambulatory Visit: Payer: Self-pay | Admitting: Cardiovascular Disease

## 2016-08-31 ENCOUNTER — Other Ambulatory Visit: Payer: Self-pay | Admitting: Family Medicine

## 2016-09-06 ENCOUNTER — Telehealth: Payer: Self-pay

## 2016-09-06 ENCOUNTER — Telehealth: Payer: Self-pay | Admitting: Internal Medicine

## 2016-09-06 NOTE — Telephone Encounter (Signed)
Patient requesting [name], she understands she has not been seen since 01/02/16, but her husband recently passed away and she states she is having a difficult time moving on and coming to her appointments. Okay to refill? Please advise. Thank you!

## 2016-09-06 NOTE — Telephone Encounter (Signed)
[id number]

## 2016-09-06 NOTE — Telephone Encounter (Signed)
Pt is unable to come to see [name] right now she is having a very hard time with her husband's death

## 2016-09-07 ENCOUNTER — Other Ambulatory Visit: Payer: Self-pay

## 2016-09-07 MED ORDER — SITAGLIPTIN PHOSPHATE 100 MG PO TABS: ORAL_TABLET | ORAL | 2 refills | Status: DC

## 2016-09-07 NOTE — Telephone Encounter (Signed)
Sorry to hear that. Okay to refill.

## 2016-09-22 ENCOUNTER — Other Ambulatory Visit: Payer: Self-pay | Admitting: Family Medicine

## 2016-10-08 ENCOUNTER — Other Ambulatory Visit: Payer: Self-pay | Admitting: *Deleted

## 2016-10-08 ENCOUNTER — Other Ambulatory Visit: Payer: Self-pay | Admitting: Family Medicine

## 2016-10-08 MED ORDER — ROSUVASTATIN CALCIUM 20 MG PO TABS: 20.0000 mg | ORAL_TABLET | Freq: Every day | ORAL | 0 refills | Status: DC

## 2016-10-08 MED ORDER — FLUTICASONE PROPIONATE 50 MCG/ACT NA SUSP
2.0000 | Freq: Every day | NASAL | 2 refills | Status: DC
Start: 2016-10-08 — End: 2017-07-16

## 2016-10-08 MED ORDER — RIVAROXABAN 20 MG PO TABS: 20.0000 mg | ORAL_TABLET | Freq: Every day | ORAL | 3 refills | Status: DC

## 2016-10-08 NOTE — Telephone Encounter (Signed)
Patient needs follow up appt. Thanks PC

## 2016-10-14 DIAGNOSIS — Z23 Encounter for immunization: Secondary | ICD-10-CM | POA: Diagnosis not present

## 2016-10-18 NOTE — Telephone Encounter (Signed)
Appt scheduled

## 2016-10-28 ENCOUNTER — Telehealth: Payer: Self-pay | Admitting: Family Medicine

## 2016-10-29 ENCOUNTER — Other Ambulatory Visit: Payer: Self-pay | Admitting: Family Medicine

## 2016-10-29 NOTE — Telephone Encounter (Signed)
[facility]  

## 2016-10-29 NOTE — Telephone Encounter (Signed)
Okay #90, no refills. Please print a prescription, I will sign

## 2016-10-29 NOTE — Telephone Encounter (Signed)
[facility] Alprazolam 0.5 mg Last filled - 08/26/16, #[id number] Last AEX - 04/22/16 Next AEX - 6-Mths Please Advise on refills/SLS 12/01

## 2016-10-29 NOTE — Telephone Encounter (Signed)
Rx printed, awaiting MD signature.  

## 2016-10-29 NOTE — Telephone Encounter (Signed)
Refill sent per [facility] refill protocol/SLS  

## 2016-11-04 ENCOUNTER — Ambulatory Visit (INDEPENDENT_AMBULATORY_CARE_PROVIDER_SITE_OTHER): Payer: Medicare Other | Admitting: Family Medicine

## 2016-11-04 ENCOUNTER — Encounter: Payer: Self-pay | Admitting: Family Medicine

## 2016-11-04 VITALS — BP 138/78 | HR 77 | Temp 97.8°F | Resp 16 | Ht 60.0 in | Wt 221.4 lb

## 2016-11-04 DIAGNOSIS — I10 Essential (primary) hypertension: Secondary | ICD-10-CM

## 2016-11-04 DIAGNOSIS — E1165 Type 2 diabetes mellitus with hyperglycemia: Secondary | ICD-10-CM | POA: Diagnosis not present

## 2016-11-04 DIAGNOSIS — F329 Major depressive disorder, single episode, unspecified: Secondary | ICD-10-CM

## 2016-11-04 DIAGNOSIS — E785 Hyperlipidemia, unspecified: Secondary | ICD-10-CM

## 2016-11-04 DIAGNOSIS — E1151 Type 2 diabetes mellitus with diabetic peripheral angiopathy without gangrene: Secondary | ICD-10-CM | POA: Diagnosis not present

## 2016-11-04 DIAGNOSIS — F418 Other specified anxiety disorders: Secondary | ICD-10-CM

## 2016-11-04 DIAGNOSIS — IMO0002 Reserved for concepts with insufficient information to code with codable children: Secondary | ICD-10-CM

## 2016-11-04 LAB — CBC WITH DIFFERENTIAL/PLATELET
BASOS PCT: 0 % (ref 0.0–3.0)
Basophils Absolute: 0 10*3/uL (ref 0.0–0.1)
EOS ABS: 0.1 10*3/uL (ref 0.0–0.7)
EOS PCT: 1.5 % (ref 0.0–5.0)
HCT: 41.4 % (ref 36.0–46.0)
HEMOGLOBIN: 14.1 g/dL (ref 12.0–15.0)
LYMPHS ABS: 1.8 10*3/uL (ref 0.7–4.0)
Lymphocytes Relative: 28 % (ref 12.0–46.0)
MCHC: 34 g/dL (ref 30.0–36.0)
MCV: 96.2 fl (ref 78.0–100.0)
MONO ABS: 0.5 10*3/uL (ref 0.1–1.0)
Monocytes Relative: 8 % (ref 3.0–12.0)
NEUTROS PCT: 62.5 % (ref 43.0–77.0)
Neutro Abs: 4 10*3/uL (ref 1.4–7.7)
PLATELETS: 124 10*3/uL — AB (ref 150.0–400.0)
RBC: 4.31 Mil/uL (ref 3.87–5.11)
RDW: 13.9 % (ref 11.5–15.5)
WBC: 6.4 10*3/uL (ref 4.0–10.5)

## 2016-11-04 LAB — COMPREHENSIVE METABOLIC PANEL
ALK PHOS: 41 U/L (ref 39–117)
ALT: 18 U/L (ref 0–35)
AST: 22 U/L (ref 0–37)
Albumin: 4.2 g/dL (ref 3.5–5.2)
BUN: 13 mg/dL (ref 6–23)
CHLORIDE: 106 meq/L (ref 96–112)
CO2: 31 mEq/L (ref 19–32)
Calcium: 9.4 mg/dL (ref 8.4–10.5)
Creatinine, Ser: 0.89 mg/dL (ref 0.40–1.20)
GFR: 66.63 mL/min (ref >60.00)
GLUCOSE: 131 mg/dL — AB (ref 70–99)
POTASSIUM: 4.7 meq/L (ref 3.5–5.1)
SODIUM: 142 meq/L (ref 135–145)
Total Bilirubin: 0.5 mg/dL (ref 0.2–1.2)
Total Protein: 7.2 g/dL (ref 6.0–8.3)

## 2016-11-04 LAB — LIPID PANEL
CHOLESTEROL: 157 mg/dL (ref 0–200)
HDL: 52.9 mg/dL (ref >39.00)
LDL CALC: 85 mg/dL (ref 0–99)
NonHDL: 104.21
Total CHOL/HDL Ratio: 3
Triglycerides: 98 mg/dL (ref 0.0–149.0)
VLDL: 19.6 mg/dL (ref 0.0–40.0)

## 2016-11-04 NOTE — Progress Notes (Signed)
Pre visit review using our clinic review tool, if applicable. No additional management support is needed unless otherwise documented below in the visit note. 

## 2016-11-04 NOTE — Patient Instructions (Signed)
Complicated Grieving Introduction Grief is a normal response to the death of someone close to you. Feelings of fear, anger, and guilt can affect almost everyone who loses a loved one. It is also common to have symptoms of depression while you are grieving. These include problems with sleep, loss of appetite, and lack of energy. They may last for weeks or months after a loss. Complicated grief is different from normal grief or depression. Normal grieving involves sadness and feelings of loss, but these feelings are not constant. Complicated grief is a constant and severe type of grief. It interferes with your ability to function normally. It may last for several months to a year or longer. Complicated grief may require treatment from a mental health care provider. What are the causes? It is not known why some people continue to struggle with grief and others do not. You may be at higher risk for complicated grief if:  The death of your loved one was sudden or unexpected.  The death of your loved one was due to a violent event.  Your loved one committed suicide.  Your loved one was a child or a young person.  You were very close to or dependent on the loved one.  You have a history of depression. What are the signs or symptoms? Signs and symptoms of complicated grief may include:  Feeling disbelief or numbness.  Being unable to enjoy good memories of your loved one.  Needing to avoid anything that reminds you of your loved one.  Being unable to stop thinking about the death.  Feeling intense anger or guilt.  Feeling alone and hopeless.  Feeling that your life is meaningless and empty.  Losing the desire to live. How is this diagnosed? Your health care provider may diagnose complicated grief if:  You have constant symptoms of grief for 6-12 months or longer.  Your symptoms are interfering with your ability to live your life. Your health care provider may want you to see a  mental health care provider. Many symptoms of depression are similar to the symptoms of complicated grief. It is important to be evaluated for complicated grief along with other mental health conditions. How is this treated? Talk therapy with a mental health provider is the most common treatment for complicated grief. During therapy, you will learn healthy ways to cope with the loss of your loved one. In some cases, your mental health care provider may also recommend antidepressant medicines. Follow these instructions at home:  Take care of yourself.  Eat regular meals and maintain a healthy diet. Eat plenty of fruits, vegetables, and whole grains.  Try to get some exercise each day.  Keep regular hours for sleep. Try to get at least 8 hours of sleep each night.  Do not use drugs or alcohol to ease your symptoms.  Take medicines only as directed by your health care provider.  Spend time with friends and loved ones.  Consider joining a grief (bereavement) support group to help you deal with your loss.  Keep all follow-up visits as directed by your health care provider. This is important. Contact a health care provider if:  Your symptoms keep you from functioning normally.  Your symptoms do not get better with treatment. Get help right away if:  You have serious thoughts of hurting yourself or someone else.  You have suicidal feelings. This information is not intended to replace advice given to you by your health care provider. Make sure you discuss any   questions you have with your health care provider. Document Released: 11/15/2005 Document Revised: 04/22/2016 Document Reviewed: 04/25/2014  2017 Elsevier  

## 2016-11-04 NOTE — Progress Notes (Signed)
Patient ID: [name], female    DOB: 1946-05-23  Age: 70 y.o. MRN: [id number]    Subjective:  Subjective  HPI ANEDRA CAPO presents for f/u bp and cholesterol.  She sees endo for dm.  Her husband passed away in 2023/08/12 and she has been anxious. And having panic attacks.  No chest pain , no sob.   Review of Systems  Constitutional: Negative for appetite change, diaphoresis, fatigue and unexpected weight change.  Eyes: Negative for pain, redness and visual disturbance.  Respiratory: Negative for cough, chest tightness, shortness of breath and wheezing.   Cardiovascular: Negative for chest pain, palpitations and leg swelling.  Endocrine: Negative for cold intolerance, heat intolerance, polydipsia, polyphagia and polyuria.  Genitourinary: Negative for difficulty urinating, dysuria and frequency.  Neurological: Negative for dizziness, light-headedness, numbness and headaches.    History Past Medical History:  Diagnosis Date  . Acute thoracic aortic dissection [facility]) 2008   emergency surgery - [facility]  . Anxiety   . Cancer ([place])    skin  . Diabetes mellitus   . Diverticulosis   . GERD (gastroesophageal reflux disease)   . Hemorrhoids   . Hyperlipidemia   . Hypertension   . Kidney stones   . Sinus node dysfunction ([facility])    hx of PPM    She has a past surgical history that includes Pacemaker insertion (08/2006); Appendectomy; Abdominal hysterectomy (1984); Thoracic aortic aneurysm repair (2000); Repair of acute ascending thoracic aortic dissection (2008); transthoracic echocardiogram (09/20/2012); Pacemaker generator change (2014); Eye surgery (2014); and pacemaker generator change (N/A, 08/29/2013).   Her family history includes Heart attack in her father and maternal grandmother; Kidney disease in her mother; Valvular heart disease in her son.She reports that she quit smoking about 23 years ago. She has never used smokeless tobacco. She reports that she does not drink alcohol  or use drugs.  Current Outpatient Prescriptions on File Prior to Visit  Medication Sig Dispense Refill  . cholecalciferol (VITAMIN D) 1000 UNITS tablet Take 1,000 Units by mouth daily.    . fluticasone (FLONASE) 50 MCG/ACT nasal spray Place 2 sprays into both nostrils daily. 48 g 2  . hyoscyamine (LEVSIN SL) 0.125 MG SL tablet TAKE 1 TABLET BY MOUTH TWICE A DAY AS NEEDED FOR CRAMPING OR DIARRHEA OR LOOSE STOOLS 60 tablet 2  . lansoprazole (PREVACID) 15 MG capsule Take 15 mg by mouth as needed (for heart burn).     Vladimir Faster Glycol-Propyl Glycol (SYSTANE OP) Apply 1 drop to eye 4 (four) times daily - after meals and at bedtime.    . Probiotic Product (PRO-BIOTIC BLEND PO) Take 1 tablet by mouth daily.    . rivaroxaban (XARELTO) 20 MG TABS tablet Take 1 tablet (20 mg total) by mouth daily with supper. 30 tablet 3  . tamsulosin (FLOMAX) 0.4 MG CAPS capsule Take 1 capsule (0.4 mg total) by mouth daily after supper. 30 capsule 0  . Tdap (BOOSTRIX) 5-2.5-18.5 LF-MCG/0.5 injection Inject 0.5 mLs into the muscle once. 0.5 mL 0   No current facility-administered medications on file prior to visit.      Objective:  Objective  Physical Exam  Constitutional: She is oriented to person, place, and time. She appears well-developed and well-nourished.  HENT:  Head: Normocephalic and atraumatic.  Eyes: Conjunctivae and EOM are normal.  Neck: Normal range of motion. Neck supple. No JVD present. Carotid bruit is not present. No thyromegaly present.  Cardiovascular: Normal rate and regular rhythm.   Murmur heard.  Pulmonary/Chest: Effort normal and breath sounds normal. No respiratory distress. She has no wheezes. She has no rales. She exhibits no tenderness.  Musculoskeletal: She exhibits no edema.  Neurological: She is alert and oriented to person, place, and time.  Psychiatric: She has a normal mood and affect.  Nursing note and vitals reviewed.  BP 138/78 (BP Location: Right Arm, Patient Position:  Sitting, Cuff Size: Large)   Pulse 77   Temp 97.8 F (36.6 C) (Oral)   Resp 16   Ht 5' (1.524 m)   Wt 221 lb 6.4 oz (100.4 kg)   SpO2 97%   BMI 43.24 kg/m  Wt Readings from Last 3 Encounters:  11/04/16 221 lb 6.4 oz (100.4 kg)  05/21/16 206 lb (93.4 kg)  04/22/16 220 lb (99.8 kg)     Lab Results  Component Value Date   WBC 6.4 11/04/2016   HGB 14.1 11/04/2016   HCT 41.4 11/04/2016   PLT 124.0 (L) 11/04/2016   GLUCOSE 131 (H) 11/04/2016   CHOL 157 11/04/2016   TRIG 98.0 11/04/2016   HDL 52.90 11/04/2016   LDLDIRECT 217.7 09/13/2011   LDLCALC 85 11/04/2016   ALT 18 11/04/2016   AST 22 11/04/2016   NA 142 11/04/2016   K 4.7 11/04/2016   CL 106 11/04/2016   CREATININE 0.89 11/04/2016   BUN 13 11/04/2016   CO2 31 11/04/2016   TSH 0.986 08/22/2013   INR 1.27 05/21/2016   HGBA1C 8.1 (H) 04/22/2016   MICROALBUR 1.6 01/21/2014    Ct Angio Chest/abd/pel For Dissection W And/or W/wo  Result Date: 05/21/2016 CLINICAL DATA:  70 year old female right sudden onset abdominal pain radiating to the back. History of abdominal aortic aneurysm repair. EXAM: CT ANGIOGRAPHY CHEST, ABDOMEN AND PELVIS TECHNIQUE: Multidetector CT imaging through the chest, abdomen and pelvis was performed using the standard protocol during bolus administration of intravenous contrast. Multiplanar reconstructed images and MIPs were obtained and reviewed to evaluate the vascular anatomy. CONTRAST:  100 cc Isovue 370 COMPARISON:  Chest CT dated 01/08/2016 and CT of the abdomen pelvis dated 06/18/2011 FINDINGS: CTA CHEST FINDINGS The lungs are clear. There is no pleural effusion or pneumothorax. The central airways are patent. There is stable appearing thoracic aortic dissection with graft repair of the ascending aorta. Stable small amount of thrombus is noted in the proximal aspect of the false lumen the origins of the great vessels of the aortic arch appear patent. There is no extension of the dissection into the  origin of the neck vessels. There is no CT evidence of central pulmonary artery embolus. There is no cardiomegaly or pericardial effusion. No hilar or mediastinal adenopathy. Left pectoral pacemaker device noted. The esophagus is grossly unremarkable. There is no axillary adenopathy. The chest wall soft tissues appear unremarkable. There is degenerative changes of the spine. There is median sternotomy wires. No acute fracture. Review of the MIP images confirms the above findings. CTA ABDOMEN AND PELVIS FINDINGS No intra-abdominal free air or free fluid. There is mild fatty infiltration of the liver. The gallbladder, pancreas, spleen, and the left adrenal gland appears unremarkable. There is a 1.4 cm indeterminate right adrenal nodule, stable from prior study. There is a 4 mm right UVJ stone with mild right hydronephrosis. A punctate nonobstructing right renal interpolar calculus. Multiple small nonobstructing left renal calculi noted. There is no hydronephrosis on the left. Bilateral renal lower pole hypodense lesions measuring up to 3.8 cm in the inferior pole the right kidney are not well characterized  but most likely represent cysts. The right renal midpole for appears to have internal content and likely represents a complex or hemorrhagic cyst. Small right perinephric fluid likely related to hydronephrosis and less likely represent rupture. Correlation with urinalysis recommended to exclude superimposed UTI. The urinary bladder is predominantly collapsed. Hysterectomy. A the ovaries are grossly unremarkable. There is extensive sigmoid diverticulosis and scattered colonic diverticula without active inflammatory changes. There is no evidence of bowel obstruction or active inflammation. Appendectomy. There is stable dissection of the abdominal aorta extending to the left common iliac artery. The origins of the celiac axis, SMA, IMA arise from the true lumen appear patent. The right renal artery also arises from the  true lumen appears patent. The left renal artery appears to arise just posterior to the dissection flap from the false lumen or receive profusion from the true and false lumens. The IVC appears unremarkable. No portal venous gas identified. There is no adenopathy. There is midline vertical anterior abdominal wall incisional scar with a small fat containing umbilical hernia. The abdominal wall soft tissues are otherwise unremarkable. There is degenerative changes of the spine. No acute osseous pathology. Review of the MIP images confirms the above findings. IMPRESSION: A 4 mm right UVJ stone with mild right hydronephrosis. Stable appearing thoracic and abdominal aortic dissection status post prior graft repair of the ascending aorta. No evidence of aortic rupture or leak. Electronically Signed   By: Anner Crete M.D.   On: 05/21/2016 22:50     Assessment & Plan:  Plan  I have discontinued Ms. Hewett's oxyCODONE, cefpodoxime, and ondansetron. I am also having her maintain her lansoprazole, cholecalciferol, Probiotic Product (PRO-BIOTIC BLEND PO), Polyethyl Glycol-Propyl Glycol (SYSTANE OP), Tdap, tamsulosin, hyoscyamine, fluticasone, rivaroxaban, ALPRAZolam, carvedilol, citalopram, fenofibrate, furosemide, glipiZIDE, rosuvastatin, and sitaGLIPtin.  Meds ordered this encounter  Medications  . ALPRAZolam (XANAX) 0.5 MG tablet    Sig: Take 1 tablet (0.5 mg total) by mouth 3 (three) times daily as needed for anxiety.    Dispense:  90 tablet    Refill:  0    Not to exceed 4 additional fills before 02/22/2017  . carvedilol (COREG) 25 MG tablet    Sig: TAKE 1/2 TABLET BY MOUTH EVERY MORNING AND 1 TABLET IN EVENING    Dispense:  180 tablet    Refill:  1  . citalopram (CELEXA) 40 MG tablet    Sig: Take 1 tablet (40 mg total) by mouth daily.    Dispense:  90 tablet    Refill:  0  . fenofibrate 160 MG tablet    Sig: TAKE 1 TABLET (160 MG TOTAL) BY MOUTH DAILY.    Dispense:  90 tablet    Refill:  1    . furosemide (LASIX) 40 MG tablet    Sig: Take 1 tablet (40 mg total) by mouth daily.    Dispense:  90 tablet    Refill:  1  . glipiZIDE (GLUCOTROL XL) 5 MG 24 hr tablet    Sig: Take 1 tablet (5 mg total) by mouth 2 (two) times daily.    Dispense:  60 tablet    Refill:  2    Dosage increased on 04/27/2016  . rosuvastatin (CRESTOR) 20 MG tablet    Sig: Take 1 tablet (20 mg total) by mouth daily. Repeat labs are due now    Dispense:  90 tablet    Refill:  0  . sitaGLIPtin (JANUVIA) 100 MG tablet    Sig: TAKE 1 TABLET (100  MG TOTAL) BY MOUTH DAILY.    Dispense:  30 tablet    Refill:  2    Problem List Items Addressed This Visit      Unprioritized   Essential hypertension   Relevant Medications   carvedilol (COREG) 25 MG tablet   fenofibrate 160 MG tablet   furosemide (LASIX) 40 MG tablet   rosuvastatin (CRESTOR) 20 MG tablet   Other Relevant Orders   Comprehensive metabolic panel (Completed)   Lipid panel (Completed)   CBC with Differential/Platelet (Completed)   POCT Urinalysis Dipstick (Automated)   Hyperlipidemia   Relevant Medications   carvedilol (COREG) 25 MG tablet   fenofibrate 160 MG tablet   furosemide (LASIX) 40 MG tablet   rosuvastatin (CRESTOR) 20 MG tablet   Other Relevant Orders   Comprehensive metabolic panel (Completed)   Lipid panel (Completed)   POCT Urinalysis Dipstick (Automated)    Other Visit Diagnoses    DM (diabetes mellitus) type II uncontrolled, periph vascular disorder (HCC)    -  Primary   Relevant Medications   carvedilol (COREG) 25 MG tablet   fenofibrate 160 MG tablet   furosemide (LASIX) 40 MG tablet   glipiZIDE (GLUCOTROL XL) 5 MG 24 hr tablet   rosuvastatin (CRESTOR) 20 MG tablet   sitaGLIPtin (JANUVIA) 100 MG tablet   Other Relevant Orders   Comprehensive metabolic panel (Completed)   Lipid panel (Completed)   CBC with Differential/Platelet (Completed)   POCT Urinalysis Dipstick (Automated)   Major depressive disorder with  single episode, remission status unspecified       Relevant Medications   ALPRAZolam (XANAX) 0.5 MG tablet   citalopram (CELEXA) 40 MG tablet   Depression with anxiety       Relevant Medications   ALPRAZolam (XANAX) 0.5 MG tablet   citalopram (CELEXA) 40 MG tablet      Follow-up: Return in about 6 months (around 05/05/2017) for annual exam, fasting.  [name], DO

## 2016-11-06 MED ORDER — FUROSEMIDE 40 MG PO TABS: 40.0000 mg | ORAL_TABLET | Freq: Every day | ORAL | 1 refills | Status: DC

## 2016-11-06 MED ORDER — CITALOPRAM HYDROBROMIDE 40 MG PO TABS: 40.0000 mg | ORAL_TABLET | Freq: Every day | ORAL | 0 refills | Status: DC

## 2016-11-06 MED ORDER — GLIPIZIDE ER 5 MG PO TB24: 5.0000 mg | ORAL_TABLET | Freq: Two times a day (BID) | ORAL | 2 refills | Status: DC

## 2016-11-06 MED ORDER — ROSUVASTATIN CALCIUM 20 MG PO TABS: 20.0000 mg | ORAL_TABLET | Freq: Every day | ORAL | 0 refills | Status: DC

## 2016-11-06 MED ORDER — SITAGLIPTIN PHOSPHATE 100 MG PO TABS: ORAL_TABLET | ORAL | 2 refills | Status: DC

## 2016-11-06 MED ORDER — CARVEDILOL 25 MG PO TABS: ORAL_TABLET | ORAL | 1 refills | Status: DC

## 2016-11-06 MED ORDER — FENOFIBRATE 160 MG PO TABS: ORAL_TABLET | ORAL | 1 refills | Status: DC

## 2016-11-06 MED ORDER — ALPRAZOLAM 0.5 MG PO TABS: 0.5000 mg | ORAL_TABLET | Freq: Three times a day (TID) | ORAL | 0 refills | Status: DC | PRN

## 2016-11-11 ENCOUNTER — Telehealth: Payer: Self-pay | Admitting: Family Medicine

## 2016-11-11 NOTE — Telephone Encounter (Signed)
04/22/16 Medicare Annual (Subsequent) preventive examination. lvm advising patient to schedule appointment next year after 04/22/16

## 2016-11-23 ENCOUNTER — Other Ambulatory Visit: Payer: Self-pay | Admitting: Internal Medicine

## 2016-11-23 DIAGNOSIS — F418 Other specified anxiety disorders: Secondary | ICD-10-CM

## 2016-11-25 NOTE — Telephone Encounter (Signed)
Last ov 11/04/16. Last fill 11/06/16 #90 0. Please advise. LB

## 2016-11-25 NOTE — Telephone Encounter (Signed)
°  [id number] [address], [place] (Phone) [phone] (Fax)        Pharmacy called to follow up on request for refill on  ALPRAZolam 0.5 MG

## 2016-11-26 ENCOUNTER — Telehealth: Payer: Self-pay | Admitting: Family Medicine

## 2016-11-26 ENCOUNTER — Other Ambulatory Visit: Payer: Self-pay | Admitting: Internal Medicine

## 2016-11-26 DIAGNOSIS — F418 Other specified anxiety disorders: Secondary | ICD-10-CM

## 2016-11-26 NOTE — Telephone Encounter (Signed)
Patient is requesting a refill of ALPRAZolam (XANAX) 0.5 MG tablet She knows that it is a little early but she is going out of town and would like to get it before then. Please advise   Pharmacy: [facility] #[id number] - [place], [place]

## 2016-11-26 NOTE — Telephone Encounter (Signed)
Rx faxed to [facility].

## 2016-11-26 NOTE — Telephone Encounter (Signed)
Medication has been addressed.  PC

## 2016-11-26 NOTE — Telephone Encounter (Signed)
Patient notified that rx was sent to pharmacy  ?

## 2016-12-07 ENCOUNTER — Other Ambulatory Visit: Payer: Self-pay | Admitting: Cardiovascular Disease

## 2016-12-07 ENCOUNTER — Other Ambulatory Visit: Payer: Self-pay

## 2016-12-07 ENCOUNTER — Telehealth: Payer: Self-pay | Admitting: Internal Medicine

## 2016-12-07 DIAGNOSIS — E1165 Type 2 diabetes mellitus with hyperglycemia: Principal | ICD-10-CM

## 2016-12-07 DIAGNOSIS — IMO0002 Reserved for concepts with insufficient information to code with codable children: Secondary | ICD-10-CM

## 2016-12-07 DIAGNOSIS — E1151 Type 2 diabetes mellitus with diabetic peripheral angiopathy without gangrene: Secondary | ICD-10-CM

## 2016-12-07 MED ORDER — SITAGLIPTIN PHOSPHATE 100 MG PO TABS: ORAL_TABLET | ORAL | 2 refills | Status: DC

## 2016-12-07 NOTE — Telephone Encounter (Signed)
Pt needs her Januvia refilled and sent to the [facility] on [address].

## 2016-12-07 NOTE — Telephone Encounter (Signed)
RX sent

## 2016-12-17 ENCOUNTER — Other Ambulatory Visit: Payer: Self-pay | Admitting: *Deleted

## 2016-12-17 DIAGNOSIS — I7101 Dissection of ascending aorta: Secondary | ICD-10-CM

## 2016-12-17 DIAGNOSIS — I7121 Aneurysm of the ascending aorta, without rupture: Secondary | ICD-10-CM

## 2016-12-22 ENCOUNTER — Encounter: Payer: Self-pay | Admitting: Cardiovascular Disease

## 2016-12-22 ENCOUNTER — Other Ambulatory Visit: Payer: Self-pay | Admitting: Internal Medicine

## 2016-12-22 ENCOUNTER — Ambulatory Visit (INDEPENDENT_AMBULATORY_CARE_PROVIDER_SITE_OTHER): Payer: Medicare Other | Admitting: Cardiovascular Disease

## 2016-12-22 VITALS — BP 118/76 | HR 74 | Ht 60.0 in | Wt 216.0 lb

## 2016-12-22 DIAGNOSIS — E1169 Type 2 diabetes mellitus with other specified complication: Secondary | ICD-10-CM

## 2016-12-22 DIAGNOSIS — I4892 Unspecified atrial flutter: Secondary | ICD-10-CM | POA: Diagnosis not present

## 2016-12-22 DIAGNOSIS — I10 Essential (primary) hypertension: Secondary | ICD-10-CM

## 2016-12-22 DIAGNOSIS — I71019 Dissection of thoracic aorta, unspecified: Secondary | ICD-10-CM

## 2016-12-22 DIAGNOSIS — I495 Sick sinus syndrome: Secondary | ICD-10-CM | POA: Diagnosis not present

## 2016-12-22 DIAGNOSIS — E785 Hyperlipidemia, unspecified: Secondary | ICD-10-CM

## 2016-12-22 DIAGNOSIS — Z950 Presence of cardiac pacemaker: Secondary | ICD-10-CM | POA: Diagnosis not present

## 2016-12-22 DIAGNOSIS — E669 Obesity, unspecified: Secondary | ICD-10-CM

## 2016-12-22 DIAGNOSIS — I7101 Dissection of thoracic aorta: Secondary | ICD-10-CM

## 2016-12-22 LAB — CUP PACEART INCLINIC DEVICE CHECK
Battery Remaining Longevity: 133 mo
Battery Voltage: 2.79 V
Brady Statistic AP VP Percent: 5 %
Date Time Interrogation Session: 20180124173719
Implantable Lead Implant Date: 20071016
Implantable Lead Location: 753859
Implantable Pulse Generator Implant Date: 20141001
Lead Channel Impedance Value: 816 Ohm
Lead Channel Pacing Threshold Amplitude: 0.5 V
Lead Channel Pacing Threshold Amplitude: 0.625 V
Lead Channel Pacing Threshold Amplitude: 0.75 V
Lead Channel Pacing Threshold Pulse Width: 0.4 ms
Lead Channel Pacing Threshold Pulse Width: 0.4 ms
MDC IDC LEAD IMPLANT DT: 20071016
MDC IDC LEAD LOCATION: 753860
MDC IDC MSMT BATTERY IMPEDANCE: 182 Ohm
MDC IDC MSMT LEADCHNL RA IMPEDANCE VALUE: 432 Ohm
MDC IDC MSMT LEADCHNL RA PACING THRESHOLD AMPLITUDE: 0.5 V
MDC IDC MSMT LEADCHNL RV PACING THRESHOLD PULSEWIDTH: 0.4 ms
MDC IDC MSMT LEADCHNL RV PACING THRESHOLD PULSEWIDTH: 0.4 ms
MDC IDC MSMT LEADCHNL RV SENSING INTR AMPL: 22.4 mV
MDC IDC SET LEADCHNL RA PACING AMPLITUDE: 1.5 V
MDC IDC SET LEADCHNL RV PACING AMPLITUDE: 2 V
MDC IDC SET LEADCHNL RV PACING PULSEWIDTH: 0.4 ms
MDC IDC SET LEADCHNL RV SENSING SENSITIVITY: 5.6 mV
MDC IDC STAT BRADY AP VS PERCENT: 95 %
MDC IDC STAT BRADY AS VP PERCENT: 0 %
MDC IDC STAT BRADY AS VS PERCENT: 0 %

## 2016-12-22 MED ORDER — CARVEDILOL 25 MG PO TABS: 25.0000 mg | ORAL_TABLET | Freq: Two times a day (BID) | ORAL | 1 refills | Status: DC

## 2016-12-22 NOTE — Patient Instructions (Signed)
Dr Sallyanne Kuster recommends that you continue on your current medications as directed. Please refer to the Current Medication list given to you today.  Remote monitoring is used to monitor your Pacemaker of ICD from home. This monitoring reduces the number of office visits required to check your device to one time per year. It allows [name] to keep an eye on the functioning of your device to ensure it is working properly. You are scheduled for a device check from home on Wednesday, April 25th, 2018. You may send your transmission at any time that day. If you have a wireless device, the transmission will be sent automatically. After your physician reviews your transmission, you will receive a postcard with your next transmission date.  Dr Sallyanne Kuster recommends that you schedule a follow-up appointment in 6 months with a pacemaker check. You will receive a reminder letter in the mail two months in advance. If you don't receive a letter, please call our office to schedule the follow-up appointment.  If you need a refill on your cardiac medications before your next appointment, please call your pharmacy.

## 2016-12-22 NOTE — Progress Notes (Signed)
Cardiology Office Note    Date:  12/22/2016   ID:  [name], DOB Nov 06, 1946, MRN [id number]  PCP:  [name], DO  Cardiologist:   Sanda Klein, MD   Chief Complaint  Patient presents with  . Follow-up    History of Present Illness:  [name] is a 71 y.o. female with a history of repaired type A aortic dissection, sinus node dysfunction with sinus node arrest, status post pacemaker implantation, morbid obesity, diabetes mellitus, hypertension. She is here today for her first follow-up visit in about a year and a first visit since her husband [name] passed away. She still quite tearful. She feels guilty that towards the ends of his life, [name] had to be under nursing care at [facility], even though she promised him that would never happen.  From a cardiac point of view she is doing well. She denies shortness of breath or angina. She has not had palpitations, syncope, focal neurological events and she denies claudication. She has mild ankle edema and takes furosemide on the average once a week to control this. She has lost 10 pounds by curtailing her intake of sweets and starches. She remains morbidly obese with a BMI of 42. No serious bleeding problems, occasionally complains of mild bleeding from her mouth when she wakes up in the morning, she believes that she bites her cheek and her sleep.  Pacemaker interrogation shows normal device function. There is no underlying atrial rhythm even 1 pacing was taken all the way down to 30 bpm. Heart rate histograms are quite favorable at the current sensor settings. She only has 5.1% ventricular pacing. For brief episodes of mode switch are recorded, all under 1 minute in duration.  She has a CT angiogram of the aorta scheduled on March 1 and subsequent follow-up with Dr. Servando Snare.   Past Medical History:  Diagnosis Date  . Acute thoracic aortic dissection [facility]) 2008   emergency surgery - [place]  . Anxiety   . Cancer  ([place])    skin  . Diabetes mellitus   . Diverticulosis   . GERD (gastroesophageal reflux disease)   . Hemorrhoids   . Hyperlipidemia   . Hypertension   . Kidney stones   . Sinus node dysfunction (HCC)    hx of PPM    Past Surgical History:  Procedure Laterality Date  . ABDOMINAL HYSTERECTOMY  1984  . APPENDECTOMY    . EYE SURGERY  2014   Rght eye  . PACEMAKER GENERATOR CHANGE  2014   Medtronic adapta  . PACEMAKER GENERATOR CHANGE N/A 08/29/2013   Procedure: PACEMAKER GENERATOR CHANGE;  Surgeon: Sanda Klein, MD;  Location: [facility];  Service: Cardiovascular;  Laterality: N/A;  . PACEMAKER INSERTION  08/2006   dual chamber Medtronic EnRhythm; r/t sinus node dysfunction   . REPAIR OF ACUTE ASCENDING THORACIC AORTIC DISSECTION  2008   Dr. Servando Snare  . THORACIC AORTIC ANEURYSM REPAIR  2000   type III  . TRANSTHORACIC ECHOCARDIOGRAM  09/20/2012   EF=>55% with mild conc LVH; LA mod dilated; RA mildly dilated; mild MR/TR/AR    Current Medications: Outpatient Medications Prior to Visit  Medication Sig Dispense Refill  . ALPRAZolam (XANAX) 0.5 MG tablet TAKE 1 TABLET BY MOUTH 3 TIMES A DAY AS NEEDED FOR ANXIETY 90 tablet 0  . carvedilol (COREG) 25 MG tablet TAKE 1/2 TABLET BY MOUTH EVERY MORNING AND 1 TABLET IN EVENING (Patient taking differently: Take 25 mg by mouth 2 (  two) times daily with a meal. TAKE 1/2 TABLET BY MOUTH EVERY MORNING AND 1 TABLET IN EVENING) 180 tablet 1  . cholecalciferol (VITAMIN D) 1000 UNITS tablet Take 1,000 Units by mouth daily.    . citalopram (CELEXA) 40 MG tablet Take 1 tablet (40 mg total) by mouth daily. 90 tablet 0  . fenofibrate 160 MG tablet TAKE 1 TABLET (160 MG TOTAL) BY MOUTH DAILY. 90 tablet 1  . fluticasone (FLONASE) 50 MCG/ACT nasal spray Place 2 sprays into both nostrils daily. (Patient taking differently: Place 2 sprays into both nostrils as needed for allergies. ) 48 g 2  . furosemide (LASIX) 40 MG tablet Take 1 tablet (40 mg total) by  mouth daily. 90 tablet 1  . glipiZIDE (GLUCOTROL XL) 5 MG 24 hr tablet Take 1 tablet (5 mg total) by mouth 2 (two) times daily. (Patient taking differently: Take 10 mg by mouth daily. ) 60 tablet 2  . hyoscyamine (LEVSIN SL) 0.125 MG SL tablet TAKE 1 TABLET BY MOUTH TWICE A DAY AS NEEDED FOR CRAMPING OR DIARRHEA OR LOOSE STOOLS 60 tablet 2  . lansoprazole (PREVACID) 15 MG capsule Take 15 mg by mouth as needed (for heart burn).     Vladimir Faster Glycol-Propyl Glycol (SYSTANE OP) Apply 1 drop to eye 4 (four) times daily - after meals and at bedtime.    . Probiotic Product (PRO-BIOTIC BLEND PO) Take 1 tablet by mouth daily.    . rivaroxaban (XARELTO) 20 MG TABS tablet Take 1 tablet (20 mg total) by mouth daily with supper. 30 tablet 3  . rosuvastatin (CRESTOR) 20 MG tablet Take 1 tablet (20 mg total) by mouth daily. Repeat labs are due now 90 tablet 0  . sitaGLIPtin (JANUVIA) 100 MG tablet TAKE 1 TABLET (100 MG TOTAL) BY MOUTH DAILY. 30 tablet 2  . tamsulosin (FLOMAX) 0.4 MG CAPS capsule Take 1 capsule (0.4 mg total) by mouth daily after supper. 30 capsule 0  . carvedilol (COREG) 25 MG tablet TAKE 1/2 TABLET BY MOUTH EVERY MORNING AND 1 TABLET IN EVENING (Patient not taking: Reported on 12/22/2016) 180 tablet 1  . Tdap (BOOSTRIX) 5-2.5-18.5 LF-MCG/0.5 injection Inject 0.5 mLs into the muscle once. (Patient not taking: Reported on 12/22/2016) 0.5 mL 0   No facility-administered medications prior to visit.      Allergies:   Oxycodone; Vicodin [hydrocodone-acetaminophen]; Neomycin-bacitracin zn-polymyx; and Sulfonamide derivatives   Social History   Social History  . Marital status: Married    Spouse name: N/A  . Number of children: 2  . Years of education: N/A   Occupational History  .  Retired   Social History Main Topics  . Smoking status: Former Smoker    Quit date: 09/04/1993  . Smokeless tobacco: Never Used  . Alcohol use No  . Drug use: No  . Sexual activity: Not Currently   Other  Topics Concern  . None   Social History Narrative   Lives at home and husband was just moved to [place]     Family History:  The patient's family history includes Heart attack in her father and maternal grandmother; Kidney disease in her mother; Valvular heart disease in her son.   ROS:   Please see the history of present illness.    ROS All other systems reviewed and are negative.   PHYSICAL EXAM:   VS:  BP 118/76 (BP Location: Right Arm, Patient Position: Sitting, Cuff Size: Normal)   Pulse 74   Ht 5' (1.524  m)   Wt 98 kg (216 lb)   BMI 42.18 kg/m    GEN: Well nourished, well developed, in no acute distress  HEENT: normal  Neck: no JVD, carotid bruits, or masses Cardiac: RRR; no murmurs, rubs, or gallops,no edema  Respiratory:  clear to auscultation bilaterally, normal work of breathing GI: soft, nontender, nondistended, + BS MS: no deformity or atrophy  Skin: warm and dry, no rash Neuro:  Alert and Oriented x 3, Strength and sensation are intact Psych: euthymic mood, full affect  Wt Readings from Last 3 Encounters:  12/22/16 98 kg (216 lb)  11/04/16 100.4 kg (221 lb 6.4 oz)  05/21/16 93.4 kg (206 lb)      Studies/Labs Reviewed:   EKG:  EKG is ordered today.  The ekg ordered today demonstrates Atrial paced, ventricular sensed with a long AV delay of 346 ms and occasional "dropped" beats due to MVP behavior. She has left ventricular hypertrophy with mildly broadened QRS, left axis deviation, secondary report changes with prominent T-wave inversion in leads V3-V6  Recent Labs: 11/04/2016: ALT 18; BUN 13; Creatinine, Ser 0.89; Hemoglobin 14.1; Platelets 124.0; Potassium 4.7; Sodium 142   Lipid Panel    Component Value Date/Time   CHOL 157 11/04/2016 1225   TRIG 98.0 11/04/2016 1225   HDL 52.90 11/04/2016 1225   CHOLHDL 3 11/04/2016 1225   VLDL 19.6 11/04/2016 1225   LDLCALC 85 11/04/2016 1225   LDLDIRECT 217.7 09/13/2011 1535     ASSESSMENT:    1. SSS  (sick sinus syndrome) ([place])   2. Pacemaker - Medtronic Adapta dual chamber Sep 2014, initial implantation 2007   3. Atrial flutter, unspecified type ([place])   4. 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, status post graft repair the ascending aorta   5. Essential hypertension   6. Hyperlipidemia LDL goal <100   7. Diabetes mellitus type 2 in obese ([place])   8. Severe obesity (BMI >= 40) (HCC)      PLAN:  In order of problems listed above:  1. SSS: "Atrially dependent" and with evidence of AV node conduction abnormalities, but with low burden of ventricular pacing. 2. PM: Normal device function. Anticipate that as years go by she will require increasing frequency of ventricular pacing. Nevertheless she should remain on beta blocker therapy due to her history of aortic dissection. 3. AFlutter: Recorded by her pacemaker without recent recurrence. On appropriate anticoagulation therapy without bleeding complications. 4. Hx Ao dissection: Follow-up CT next month 5. HTN: External control. She has returned to taking carvedilol 25 mg twice daily. 6. HLP: LDL at target, performed last month. On statin. She does not have CAD. Target LDL less than 100 due to diabetes mellitus. 7. DM: Hemoglobin A1c not checked since last May. She has been too preoccupied with caring for her husband to really pay enough attention to her own health. Following his unfortunate demise, she needs to dedicate herself to weight loss, more exercise and I'm sure this will lead to improvement in glycemic control. 8. Obesity: Skull is placed to achieve weight loss. Diet and exercise.    Medication Adjustments/Labs and Tests Ordered: Current medicines are reviewed at length with the patient today.  Concerns regarding medicines are outlined above.  Medication changes, Labs and Tests ordered today are listed in the Patient Instructions below. Patient Instructions  Dr Sallyanne Kuster recommends that  you continue on your current medications as directed. Please refer to the Current Medication  list given to you today.  Remote monitoring is used to monitor your Pacemaker of ICD from home. This monitoring reduces the number of office visits required to check your device to one time per year. It allows [name] to keep an eye on the functioning of your device to ensure it is working properly. You are scheduled for a device check from home on Wednesday, April 25th, 2018. You may send your transmission at any time that day. If you have a wireless device, the transmission will be sent automatically. After your physician reviews your transmission, you will receive a postcard with your next transmission date.  Dr Sallyanne Kuster recommends that you schedule a follow-up appointment in 6 months with a pacemaker check. You will receive a reminder letter in the mail two months in advance. If you don't receive a letter, please call our office to schedule the follow-up appointment.  If you need a refill on your cardiac medications before your next appointment, please call your pharmacy.    Signed, Sanda Klein, MD  12/22/2016 2:18 PM    [facility] [address], [place], [place]  [address] Phone: [phone]; Fax: [phone]

## 2016-12-27 ENCOUNTER — Other Ambulatory Visit: Payer: Self-pay | Admitting: Family Medicine

## 2016-12-27 DIAGNOSIS — F418 Other specified anxiety disorders: Secondary | ICD-10-CM

## 2016-12-28 NOTE — Telephone Encounter (Signed)
Faxed hardcopy for alprazolam to [place]

## 2016-12-28 NOTE — Telephone Encounter (Signed)
Last Office visit 11/04/2016/No upcoming appt. scheduled Last refill  #90 with 0 refills on 11/26/2016 No UDS/No contract

## 2016-12-29 ENCOUNTER — Telehealth: Payer: Self-pay | Admitting: Internal Medicine

## 2016-12-29 ENCOUNTER — Other Ambulatory Visit: Payer: Self-pay

## 2016-12-29 DIAGNOSIS — E1165 Type 2 diabetes mellitus with hyperglycemia: Principal | ICD-10-CM

## 2016-12-29 DIAGNOSIS — E1151 Type 2 diabetes mellitus with diabetic peripheral angiopathy without gangrene: Secondary | ICD-10-CM

## 2016-12-29 DIAGNOSIS — IMO0002 Reserved for concepts with insufficient information to code with codable children: Secondary | ICD-10-CM

## 2016-12-29 MED ORDER — GLIPIZIDE ER 5 MG PO TB24: 5.0000 mg | ORAL_TABLET | Freq: Two times a day (BID) | ORAL | 2 refills | Status: DC

## 2016-12-29 NOTE — Telephone Encounter (Signed)
Patient is upset, she state this is her 2nd time requesting refill of medication. glipiZIDE (GLUCOTROL XL) 5 MG 24 hr tablet  Please send to pharmacy   [id number] [address], [place] [phone] (Phone) [phone] (Fax)

## 2016-12-29 NOTE — Telephone Encounter (Signed)
rx submitted.  

## 2017-01-12 ENCOUNTER — Other Ambulatory Visit: Payer: Self-pay | Admitting: Family Medicine

## 2017-01-18 ENCOUNTER — Other Ambulatory Visit: Payer: Self-pay | Admitting: Family Medicine

## 2017-01-24 ENCOUNTER — Ambulatory Visit (INDEPENDENT_AMBULATORY_CARE_PROVIDER_SITE_OTHER): Payer: Medicare Other | Admitting: Internal Medicine

## 2017-01-24 ENCOUNTER — Encounter: Payer: Self-pay | Admitting: Internal Medicine

## 2017-01-24 VITALS — BP 120/84 | HR 83 | Ht 59.5 in | Wt 213.0 lb

## 2017-01-24 DIAGNOSIS — E669 Obesity, unspecified: Secondary | ICD-10-CM | POA: Diagnosis not present

## 2017-01-24 DIAGNOSIS — E1165 Type 2 diabetes mellitus with hyperglycemia: Secondary | ICD-10-CM

## 2017-01-24 DIAGNOSIS — E1151 Type 2 diabetes mellitus with diabetic peripheral angiopathy without gangrene: Secondary | ICD-10-CM

## 2017-01-24 DIAGNOSIS — IMO0002 Reserved for concepts with insufficient information to code with codable children: Secondary | ICD-10-CM

## 2017-01-24 DIAGNOSIS — E1169 Type 2 diabetes mellitus with other specified complication: Secondary | ICD-10-CM

## 2017-01-24 LAB — POCT GLYCOSYLATED HEMOGLOBIN (HGB A1C): Hemoglobin A1C: 6.4

## 2017-01-24 MED ORDER — GLIPIZIDE ER 2.5 MG PO TB24: 2.5000 mg | ORAL_TABLET | Freq: Every day | ORAL | 1 refills | Status: DC

## 2017-01-24 NOTE — Progress Notes (Signed)
Patient ID: [name], female   DOB: 1945-12-10, 71 y.o.   MRN: [id number]  HPI: [name] is a 71 y.o.-year-old female, returning for f/u for DM2, dx 08/2006, non-insulin-dependent, uncontrolled, with complications (h/o Ao dissection). Last visit 1 year ago.  At last visit, she was very stressed caring for her very sick husband. He died Aug 24, 2016.   She started a diet >> lost 10 lbs.  Last hemoglobin A1c was: Lab Results  Component Value Date   HGBA1C 8.1 (H) 04/22/2016   HGBA1C 6.9 01/02/2016   HGBA1C 6.8 (H) 04/24/2015   Pt is on a regimen of: - Januvia 100 mg - Glipizide XL 5 mg in am Metformin ER  1000 mg/day added 07/2014 >> diarrhea She tried Metformin at Dx >> diarrhea/  Pt checks her sugars  2x a day - improving: - am: 100-140 >> 110-120, in 07/2014: 130-160 >> 136-158 >> 136-170, 180 >> 129-154 - 2h after b'fast: n/c - before lunch: n/c >> 128-161 >> n/c - 2h after lunch: n/c >> 170 >> n/c - before dinner: 150-158 >> 110-168 >> 95-131 >> 76-130 >> 72-120, 130 >> 60-135 - 2h after dinner: n/c >> 130-134 >> n/c - bedtime: n/c - nighttime: n/c No lows. Lowest sugar was 100 >> 76 >> 72 >> 60; Has hypoglycemia awareness 80. Highest sugar was 161 x 1 >> 180 >> 195.  - no CKD, last BUN/creatinine:  Lab Results  Component Value Date   BUN 13 11/04/2016   CREATININE 0.89 11/04/2016  No ACEI.  - last set of lipids: Lab Results  Component Value Date   CHOL 157 11/04/2016   HDL 52.90 11/04/2016   LDLCALC 85 11/04/2016   LDLDIRECT 217.7 09/13/2011   TRIG 98.0 11/04/2016   CHOLHDL 3 11/04/2016  On Crestor.  - last eye exam was in 09/2015 >> no DR.  - no numbness and tingling in her feet.  I reviewed her chart and she also has a history of 2x thoracic Ao aneurysm. She has a pacemaker. Sees Dr. Sallyanne Kuster.  I reviewed pt's medications, allergies, PMH, social hx, family hx, and changes were documented in the history of present illness. Otherwise, unchanged from  my initial visit note.  ROS: Constitutional: + weight gain/loss, + fatigue Eyes: no blurry vision, no xerophthalmia ENT: no sore throat, no nodules palpated in throat, no dysphagia/odynophagia, no hoarseness Cardiovascular: no CP/SOB/palpitations/+ leg swelling Respiratory: no cough/SOB Gastrointestinal: no N/V/D/C Musculoskeletal: no muscle/joint aches Skin: no rashes Neurological: no tremors/numbness/tingling/+ dizziness  PE: BP 120/84 (BP Location: Left Arm, Patient Position: Sitting)   Pulse 83   Ht 4' 11.5" (1.511 m)   Wt 213 lb (96.6 kg)   SpO2 96%   BMI 42.30 kg/m  Wt Readings from Last 3 Encounters:  01/24/17 213 lb (96.6 kg)  12/22/16 216 lb (98 kg)  11/04/16 221 lb 6.4 oz (100.4 kg)   Constitutional: overweight, in NAD Eyes: PERRLA, EOMI, no exophthalmos ENT: moist mucous membranes, no thyromegaly, no cervical lymphadenopathy Cardiovascular: RRR, + 1/6 SEM + periankle edema B Respiratory: CTA B Gastrointestinal: abdomen soft, NT, ND, BS+ Musculoskeletal: no deformities, strength intact in all 4 Skin: moist, warm, no rashes Neurological: no tremor with outstretched hands, DTR normal in all 4  ASSESSMENT: 1. DM2, non-insulin-dependent, uncontrolled, with complications - h/o 2x aortic dissection, now SEM - Dr Sallyanne Kuster  PLAN:  1. Patient with long-standing, previously fairly well controlled diabetes, on oral antidiabetic regimen with Januvia + Glipizide XL, returning after a  long absence. Since last visit, she had an A1c last spring, which was much higher than before, at 8.1%. Her husband died last fall and she is still having difficulties dealing with his death.  - at last visit, as she was continuing to have higher sugars in am but lows later in the day >> we decreased the Glipizide XL dose to 2.5 mg daily. She did not decrease >> still on 5 mg daily. Sugars lower in the pm >> has to eat to bring the sugars up >> will decrease the dose to 2.5 mg in am. - I  suggested: Patient Instructions  Please continue: - Januvia 100 mg in am  Please decrease: - Glipizide XL to 2.5 mg in am   Please come back for a follow-up appointment in 3 months.  - continue checking sugars at different times of the day - check once a day, rotating checks - She needs a new eye exam >> advised to schedule - check A1c today >> 6.4% (better!) - Return to clinic in 3 mo with sugar log   Philemon Kingdom, MD PhD [facility]

## 2017-01-24 NOTE — Patient Instructions (Addendum)
Please continue: - Januvia 100 mg in am  Please decrease: - Glipizide XL to 2.5 mg in am   Please come back for a follow-up appointment in 3 months.

## 2017-01-24 NOTE — Addendum Note (Signed)
Addended by: Caprice Beaver T on: 01/24/2017 02:20 PM   Modules accepted: Orders

## 2017-01-25 ENCOUNTER — Other Ambulatory Visit: Payer: Self-pay | Admitting: Family Medicine

## 2017-01-25 DIAGNOSIS — F418 Other specified anxiety disorders: Secondary | ICD-10-CM

## 2017-01-26 NOTE — Telephone Encounter (Signed)
Received request for Xanax 0.5mg  (take 1 tab po TID prn for sleep).  Last Rf: 12/28/2016 Last Ov: 11/04/2016 Next Ov: 05/05/2017 UDS: None  Forwarded to Provider for review, approval or denial.

## 2017-01-26 NOTE — Telephone Encounter (Signed)
Faxed a hard copy of Rx request (xanax 0.5mg ; take 1 tab po TID prn for sleep) [id number][facility]

## 2017-01-26 NOTE — Telephone Encounter (Signed)
Will fill on behalf of reg PCP. 

## 2017-01-27 ENCOUNTER — Ambulatory Visit (INDEPENDENT_AMBULATORY_CARE_PROVIDER_SITE_OTHER): Payer: Medicare Other | Admitting: Cardiothoracic Surgery

## 2017-01-27 ENCOUNTER — Encounter: Payer: Self-pay | Admitting: Cardiothoracic Surgery

## 2017-01-27 ENCOUNTER — Ambulatory Visit
Admission: RE | Admit: 2017-01-27 | Discharge: 2017-01-27 | Disposition: A | Discharge status: Home / Self Care | Payer: Medicare Other | Source: Ambulatory Visit | Attending: Cardiothoracic Surgery | Admitting: Cardiothoracic Surgery

## 2017-01-27 VITALS — BP 150/85 | HR 92 | Resp 16 | Ht 59.5 in | Wt 213.0 lb

## 2017-01-27 DIAGNOSIS — R011 Cardiac murmur, unspecified: Secondary | ICD-10-CM | POA: Diagnosis not present

## 2017-01-27 DIAGNOSIS — I7101 Dissection of thoracic aorta: Secondary | ICD-10-CM | POA: Diagnosis not present

## 2017-01-27 DIAGNOSIS — I71019 Dissection of thoracic aorta, unspecified: Secondary | ICD-10-CM

## 2017-01-27 DIAGNOSIS — I7121 Aneurysm of the ascending aorta, without rupture: Secondary | ICD-10-CM

## 2017-01-27 MED ORDER — IOPAMIDOL (ISOVUE-370) INJECTION 76%
75.0000 mL | Freq: Once | INTRAVENOUS | Status: AC | PRN
  Administered 2017-01-27: 75 mL via INTRAVENOUS

## 2017-01-27 NOTE — Progress Notes (Signed)
[place] Record [id number] Date of Birth: [birth year]  Referring: [name], * Primary Care: [name], DO  Chief Complaint:    Chief Complaint  Patient presents with  . Follow-up    1 yr f/u aortic dissection with CTA CHEST   09/02/2007  OPERATIVE REPORT  PREOPERATIVE DIAGNOSIS: Acute type A aortic dissection.  POSTOPERATIVE DIAGNOSIS: Acute type A aortic dissection.  PROCEDURE: Replacement of ascending aorta and resuspension of aortic  valve with hypothermic circulatory arrest.   History of Present Illness:    Patient is status post replacement of the ascending aorta for acute aortic dissection in 2008. Since last seen the patient's husband died at [facility] after 17 year disabilities related to a stroke. Of the past several months she was quite proud of the fact that she had gotten her hemoglobin A1c down to 6.3 and had lost 12 pounds. She denies symptoms of heart failure or angina.  Current Activity/ Functional Status: Patient is independent with mobility/ambulation, transfers, ADL's, IADL's.   Zubrod Score: At the time of surgery this patient's most appropriate activity status/level should be described as: []     0    Normal activity, no symptoms [x]     1    Restricted in physical strenuous activity but ambulatory, able to do out light work []     2    Ambulatory and capable of self care, unable to do work activities, up and about >50 % of waking hours                              []     3    Only limited self care, in bed greater than 50% of waking hours []     4    Completely disabled, no self care, confined to bed or chair []     5    Moribund  Past Medical History:  Diagnosis Date  . Acute thoracic aortic dissection [facility]) 2008   emergency surgery - Emmary Culbreath  . Anxiety   . Cancer ([place])    skin  . Diabetes mellitus   . Diverticulosis   . GERD (gastroesophageal reflux disease)   . Hemorrhoids    . Hyperlipidemia   . Hypertension   . Kidney stones   . Sinus node dysfunction (H[facility])    hx of PPM    Past Surgical History:  Procedure Laterality Date  . ABDOMINAL HYSTERECTOMY  1984  . APPENDECTOMY    . EYE SURGERY  2014   Rght eye  . PACEMAKER GENERATOR CHANGE  2014   Medtronic adapta  . PACEMAKER GENERATOR CHANGE N/A 08/29/2013   Procedure: PACEMAKER GENERATOR CHANGE;  Surgeon: Sanda Klein, MD;  Location: [facility] CATH [facility];  Service: Cardiovascular;  Laterality: N/A;  . PACEMAKER INSERTION  08/2006   dual chamber Medtronic EnRhythm; r/t sinus node dysfunction   . REPAIR OF ACUTE ASCENDING THORACIC AORTIC DISSECTION  2008   Dr. Servando Snare  . THORACIC AORTIC ANEURYSM REPAIR  2000   type III  . TRANSTHORACIC ECHOCARDIOGRAM  09/20/2012   EF=>55% with mild conc LVH; LA mod dilated; RA mildly dilated; mild MR/TR/AR    Family History  Problem Relation Age of Onset  . Heart attack Father   . Cancer Neg Hx   . Kidney disease Mother   .  Heart attack Maternal Grandmother   . Valvular heart disease Son     valve replacement at 20    Social History   Social History  . Marital status: Married    Spouse name: N/A  . Number of children: 2  . Years of education: N/A   Occupational History  .  Retired   Social History Main Topics  . Smoking status: Former Smoker    Quit date: 09/04/1993  . Smokeless tobacco: Never Used  . Alcohol use No  . Drug use: No  . Sexual activity: Not Currently   Other Topics Concern  . Not on file   Social History Narrative   Lives at home and husband was just moved to [place]    History  Smoking Status  . Former Smoker  . Quit date: 09/04/1993  Smokeless Tobacco  . Never Used    History  Alcohol Use No     Allergies  Allergen Reactions  . Oxycodone Itching  . Vicodin [Hydrocodone-Acetaminophen] Itching  . Neomycin-Bacitracin Zn-Polymyx Other (See Comments)    Red , swollen eye  . Sulfonamide Derivatives Other (See Comments)      Just had reaction when taking during pregnancy- last child birth 52    Current Outpatient Prescriptions  Medication Sig Dispense Refill  . ALPRAZolam (XANAX) 0.5 MG tablet TAKE 1 TABLET BY MOUTH 3 TIMES A DAY AS NEEDED 90 tablet 0  . carvedilol (COREG) 25 MG tablet Take 1 tablet (25 mg total) by mouth 2 (two) times daily with a meal. 180 tablet 1  . cholecalciferol (VITAMIN D) 1000 UNITS tablet Take 1,000 Units by mouth daily.    . citalopram (CELEXA) 40 MG tablet Take 1 tablet (40 mg total) by mouth daily. 90 tablet 0  . fenofibrate 160 MG tablet TAKE 1 TABLET (160 MG TOTAL) BY MOUTH DAILY. 90 tablet 1  . fluticasone (FLONASE) 50 MCG/ACT nasal spray Place 2 sprays into both nostrils daily. (Patient taking differently: Place 2 sprays into both nostrils as needed for allergies. ) 48 g 2  . furosemide (LASIX) 40 MG tablet Take 1 tablet (40 mg total) by mouth daily. 90 tablet 1  . glipiZIDE (GLUCOTROL XL) 2.5 MG 24 hr tablet Take 1 tablet (2.5 mg total) by mouth daily before breakfast. 90 tablet 1  . hyoscyamine (LEVSIN SL) 0.125 MG SL tablet TAKE 1 TABLET BY MOUTH TWICE A DAY AS NEEDED FOR CRAMPING OR DIARRHEA OR LOOSE STOOLS 60 tablet 2  . lansoprazole (PREVACID) 15 MG capsule Take 15 mg by mouth as needed (for heart burn).     Vladimir Faster Glycol-Propyl Glycol (SYSTANE OP) Apply 1 drop to eye 4 (four) times daily - after meals and at bedtime.    . Probiotic Product (PRO-BIOTIC BLEND PO) Take 1 tablet by mouth daily.    . rivaroxaban (XARELTO) 20 MG TABS tablet Take 1 tablet (20 mg total) by mouth daily with supper. 30 tablet 3  . rosuvastatin (CRESTOR) 20 MG tablet Take 1 tablet (20 mg total) by mouth daily. Repeat labs are due now 90 tablet 0  . sitaGLIPtin (JANUVIA) 100 MG tablet TAKE 1 TABLET (100 MG TOTAL) BY MOUTH DAILY. 30 tablet 2   No current facility-administered medications for this visit.        Review of Systems:     Cardiac Review of Systems: Y or N  Chest Pain [  n  ]   Resting SOB [n   ] Exertional SOB  Blue.Reese  ]  Cardell Peach  ]   Pedal Edema [n   ]    Palpitations Florencio.Farrier  ] Syncope  Florencio.Farrier  ]   Presyncope [  n ]  General Review of Systems: [Y] = yes [  ]=no Constitional: recent weight change [n  ]; anorexia [n  ]; fatigue [ y ]; nausea [ n ]; night sweats [ n ]; fever [ n ]; or chills [n  ];                                                                                                                                          Dental: poor dentition[ n ];   Eye : blurred vision [  ]; diplopia [   ]; vision changes [  ];  Amaurosis fugax[  ]; Resp: cough [  ];  wheezing[  ];  hemoptysis[  ]; shortness of breath[ n ]; paroxysmal nocturnal dyspnea[n  ]; dyspnea on exertion[ y ]; or orthopnea[  ];  GI:  gallstones[  ], vomiting[  ];  dysphagia[  ]; melena[  ];  hematochezia [  ]; heartburn[  ];   Hx of  Colonoscopy[  ]; GU: kidney stones [  ]; hematuria[  ];   dysuria [  ];  nocturia[  ];  history of     obstruction [  ];             Skin: rash, swelling[  ];, hair loss[  ];  peripheral edema[  ];  or itching[  ]; Musculosketetal: myalgias[  ];  joint swelling[  ];  joint erythema[  ];  joint pain[  ];  back pain[  ];  Heme/Lymph: bruising[  ];  bleeding[  ];  anemia[  ];  Neuro: TIA[  ];  headaches[  ];  stroke[  ];  vertigo[  ];  seizures[  ];   paresthesias[  ];  difficulty walking[  ];  Psych:depression[  ]; anxiety[  ];  Endocrine: diabetes[  ];  thyroid dysfunction[  ];  Immunizations: Flu [?  ]; Pneumococcal[ ? ];  Other:  Physical Exam: BP (!) 150/85 (BP Location: Left Arm, Patient Position: Sitting, Cuff Size: Large) Comment: NO MEDS TODAY  Pulse 92   Resp 16   Ht 4' 11.5" (1.511 m)   Wt 213 lb (96.6 kg)   SpO2 97% Comment: ON RA  BMI 42.30 kg/m  Rhythm is regular today,  General appearance: alert, cooperative and appears older than stated age Neurologic: intact Heart: regular rate and rhythm, S1, S2 normal, early systolic ejection murmur unchanged no  murmur of aortic insufficiency click, rub or gallop and normal apical impulse Lungs: clear to auscultation bilaterally and normal percussion bilaterally Abdomen: soft, non-tender; bowel sounds normal; no masses,  no organomegaly Extremities: extremities normal, atraumatic, no cyanosis or edema and Homans sign is negative, no sign of DVT Wound: sternum stable  Diagnostic Studies & Laboratory data:     Recent Radiology Findings:  Ct Angio Chest Aorta W &/or Wo Contrast  Result Date: 01/27/2017 CLINICAL DATA:  Type A dissection of thoracic aorta. EXAM: CT ANGIOGRAPHY CHEST WITH CONTRAST TECHNIQUE: Multidetector CT imaging of the chest was performed using the standard protocol during bolus administration of intravenous contrast. Multiplanar CT image reconstructions and MIPs were obtained to evaluate the vascular anatomy. CONTRAST:  75 mL of Isovue 370 intravenously. COMPARISON:  CT scan of May 21, 2016. FINDINGS: Cardiovascular: Status post surgical repair of ascending thoracic aorta. Left-sided pacemaker is again noted and unchanged. Stable dissection is seen that originates proximal to the right innominate artery and extends through transverse and descending thoracic aorta into abdominal aorta. This is consistent with a type A thoracic dissection. Great vessels are widely patent without stenosis. Mediastinum/Nodes: No enlarged mediastinal, hilar, or axillary lymph nodes. Thyroid gland, trachea, and esophagus demonstrate no significant findings. Lungs/Pleura: Lungs are clear. No pleural effusion or pneumothorax. Upper Abdomen: Bilateral nonobstructive nephrolithiasis. Musculoskeletal: No chest wall abnormality. No acute or significant osseous findings. Review of the MIP images confirms the above findings. IMPRESSION: Status post surgical repair of ascending thoracic aorta. Stable type A thoracic aortic dissection is noted which extends through transverse and descending thoracic aorta into abdominal aorta.  Bilateral nonobstructive nephrolithiasis. Electronically Signed   By: Marijo Conception, M.D.   On: 01/27/2017 12:57    CT march 2014 RADIOLOGY REPORT*  Clinical Data: History of aortic dissection.  CT CHEST, ABDOMEN AND PELVIS WITH CONTRAST  Technique: Multidetector CT imaging of the chest, abdomen and  pelvis was performed following the standard protocol during bolus  administration of intravenous contrast.  Contrast: 189mL OMNIPAQUE IOHEXOL 300 MG/ML SOLN  Comparison: CT of the chest, abdomen and pelvis 06/18/2011.  CT CHEST  Findings:  Mediastinum: Postoperative changes of median sternotomy are noted,  likely from prior graft repair of the ascending thoracic aorta  (presumably from prior type A aortic dissection). Again noted is a  dissection which begins after the distal end of the ascending  thoracic aorta graft in the proximal aortic arch. The dissection  flap does not appear to extend into the great vessels. The  dissection does extend inferiorly to the level of the pelvis (see  below). The aortic arch is mildly dilated (3.2 cm in diameter)  which is unchanged, and the descending thoracic aorta is also  mildly dilated up to 3.4 cm in diameter at the level of the  pulmonary arteries (also unchanged). Contrast fills both the true  and false lumens. No abnormal high attenuation material is seen  adjacent to the thoracic aorta to suggest active hemorrhage at this  time.  Heart size is mildly enlarged. There is no significant pericardial  fluid, thickening or pericardial calcification. Calcifications of  the aortic valve. Left-sided pacemaker with lead terminating in  the right atrium and right ventricular apex. No pathologically  enlarged mediastinal or hilar lymph nodes. Esophagus is  unremarkable appearance.  Lungs/Pleura: No acute consolidative airspace disease. No pleural  effusions. There are some linear opacities scattered throughout  the periphery of the lung bases  bilaterally which are unchanged and  compatible with areas of mild chronic pleural parenchymal scarring.  No suspicious appearing pulmonary nodules or masses are identified  at this time.  Musculoskeletal: Sternotomy wires. There are no aggressive  appearing lytic or blastic lesions noted in the visualized portions  of the skeleton.  IMPRESSION:  1. Postoperative changes of  graft repair of the ascending thoracic  aorta for prior type A dissection. The residual  dissection/aneurysm in the arch and descending thoracic aorta is  essentially unchanged in appearance compared to the prior  examination, as detailed above.  2. Mild cardiomegaly.  3. Additional incidental findings, as above.  CT ABDOMEN AND PELVIS  Findings:  Abdomen/Pelvis: The previously described dissection flap extends  into the pelvis where appears to terminate in the distal left  common iliac artery. All of the abdominal and pelvic vessels  appear to be perfusing well at this time, with the true lumen  giving rise to the celiac axis, superior mesenteric artery, right  renal artery and the inferior mesenteric artery, and the false  lumen giving rise to the left renal artery. The dissection flap  terminates in the distal left common iliac artery, with the true  lumen giving rise to the left internal iliac artery and the false  lumen giving rise to left external iliac artery. The dissection  flap does not extend into the right iliac vasculature. Maximal  abdominal aortic diameter is 2.5 x 2.0 cm at a level just below the  renal arteries.  The appearance of the liver, gallbladder, pancreas, spleen and left  adrenal gland is unremarkable. Low attenuation right adrenal  nodule is unchanged in size measuring 17 mm in diameter and has  been previously characterized as an adenoma. Low attenuation  lesions in the kidneys bilaterally are unchanged and compatible  with simple cysts, with the largest lesion in the lower pole of  the  right kidney measuring approximately 3.3 cm in diameter.  No significant volume of ascites. No pneumoperitoneum. No  pathologic distension of small bowel. There are numerous colonic  diverticula, particularly of the descending colon and sigmoid  colon, without surrounding inflammatory changes to suggest acute  diverticulitis at this time. Status post hysterectomy. Ovaries  are unremarkable in appearance. Urinary bladder is normal in  appearance. No definite pathologic lymphadenopathy identified  within the abdomen or pelvis.  Musculoskeletal: There are no aggressive appearing lytic or blastic  lesions noted in the visualized portions of the skeleton.  IMPRESSION:  1. No change in the extent of the continuation of the dissection  from the thoracic aorta into the abdomen and pelvis, as detailed  above.  2. 1.7 cm right adrenal adenoma is unchanged.  3. Mild colonic diverticulosis without evidence to suggest acute  diverticulitis at this time.  4. Additional incidental findings, as above, similar to prior  studies.  Original Report Authenticated By: Vinnie Langton, M.D.     Recent Lab Findings: Lab Results  Component Value Date   WBC 6.4 11/04/2016   HGB 14.1 11/04/2016   HCT 41.4 11/04/2016   PLT 124.0 (L) 11/04/2016   GLUCOSE 131 (H) 11/04/2016   CHOL 157 11/04/2016   TRIG 98.0 11/04/2016   HDL 52.90 11/04/2016   LDLDIRECT 217.7 09/13/2011   LDLCALC 85 11/04/2016   ALT 18 11/04/2016   AST 22 11/04/2016   NA 142 11/04/2016   K 4.7 11/04/2016   CL 106 11/04/2016   CREATININE 0.89 11/04/2016   BUN 13 11/04/2016   CO2 31 11/04/2016   TSH 0.986 08/22/2013   INR 1.27 05/21/2016   HGBA1C 6.4 01/24/2017      Assessment / Plan:      Patient seen in followup after aortic dissection acute repair done 09/02/2007, followup CTA of the chest done today Followup CTA of the chest shows persistent false lumen after repair  of acute aortic dissection and replacement of  ascending aorta and resuspension of aortic valve, followup scan shows no progressive enlargement of the aorta especially in the arch.  Plan to see her back in 12 months with followup CTA scan of the chest.    Grace Isaac MD  Beeper [phone] Office [phone] 01/27/2017 2:05 PM

## 2017-02-17 ENCOUNTER — Other Ambulatory Visit: Payer: Self-pay | Admitting: Cardiovascular Disease

## 2017-02-17 DIAGNOSIS — I10 Essential (primary) hypertension: Secondary | ICD-10-CM

## 2017-02-18 NOTE — Telephone Encounter (Signed)
REFILL 

## 2017-02-20 ENCOUNTER — Other Ambulatory Visit: Payer: Self-pay | Admitting: Family Medicine

## 2017-02-20 DIAGNOSIS — F418 Other specified anxiety disorders: Secondary | ICD-10-CM

## 2017-02-21 NOTE — Telephone Encounter (Signed)
Requesting Alprazolam 0.5mg -Take 1 tablet by mouth 3 times a day as needed. Last refill:01/27/17:#90,0 Last OV:11/04/16 UDS:02/21/13-Next 08/24/13 Please advise.//AB/CMA

## 2017-02-22 NOTE — Telephone Encounter (Signed)
Faxed hardcopy for alprazolam to [facility]

## 2017-03-23 ENCOUNTER — Telehealth: Payer: Self-pay | Admitting: Cardiology

## 2017-03-23 ENCOUNTER — Encounter: Payer: Medicare Other | Admitting: *Deleted

## 2017-03-23 NOTE — Telephone Encounter (Signed)
Spoke with pt and reminded pt of remote transmission that is due today. Pt verbalized understanding and stated that she is traveling today and she will send the transmission tomorrow.

## 2017-03-24 ENCOUNTER — Other Ambulatory Visit: Payer: Self-pay | Admitting: Family Medicine

## 2017-03-24 DIAGNOSIS — F418 Other specified anxiety disorders: Secondary | ICD-10-CM

## 2017-03-24 NOTE — Telephone Encounter (Signed)
Faxed hardcopy for Alprazolam to [facility]

## 2017-03-24 NOTE — Telephone Encounter (Signed)
Requesting:  Alprazolam Contract   None UDS   None Last OV    11/04/2016 Last Refill      #90 no refills on 01/28/2017  Please Advise

## 2017-03-25 ENCOUNTER — Encounter: Payer: Self-pay | Admitting: Cardiology

## 2017-03-29 ENCOUNTER — Other Ambulatory Visit: Payer: Self-pay

## 2017-03-29 DIAGNOSIS — E1151 Type 2 diabetes mellitus with diabetic peripheral angiopathy without gangrene: Secondary | ICD-10-CM

## 2017-03-29 DIAGNOSIS — E1165 Type 2 diabetes mellitus with hyperglycemia: Principal | ICD-10-CM

## 2017-03-29 DIAGNOSIS — IMO0002 Reserved for concepts with insufficient information to code with codable children: Secondary | ICD-10-CM

## 2017-03-29 MED ORDER — GLIPIZIDE ER 2.5 MG PO TB24: 2.5000 mg | ORAL_TABLET | Freq: Every day | ORAL | 1 refills | Status: DC

## 2017-04-03 ENCOUNTER — Other Ambulatory Visit: Payer: Self-pay | Admitting: Internal Medicine

## 2017-04-03 DIAGNOSIS — E1165 Type 2 diabetes mellitus with hyperglycemia: Principal | ICD-10-CM

## 2017-04-03 DIAGNOSIS — IMO0002 Reserved for concepts with insufficient information to code with codable children: Secondary | ICD-10-CM

## 2017-04-03 DIAGNOSIS — E1151 Type 2 diabetes mellitus with diabetic peripheral angiopathy without gangrene: Secondary | ICD-10-CM

## 2017-04-06 ENCOUNTER — Other Ambulatory Visit: Payer: Self-pay | Admitting: Cardiovascular Disease

## 2017-04-14 ENCOUNTER — Ambulatory Visit (INDEPENDENT_AMBULATORY_CARE_PROVIDER_SITE_OTHER): Payer: Medicare Other | Admitting: *Deleted

## 2017-04-14 DIAGNOSIS — I495 Sick sinus syndrome: Secondary | ICD-10-CM | POA: Diagnosis not present

## 2017-04-15 ENCOUNTER — Encounter: Payer: Self-pay | Admitting: Cardiology

## 2017-04-15 NOTE — Progress Notes (Signed)
Remote pacemaker transmission.   

## 2017-04-19 LAB — CUP PACEART REMOTE DEVICE CHECK
Battery Impedance: 182 Ohm
Battery Remaining Longevity: 131 mo
Battery Voltage: 2.79 V
Brady Statistic AP VP Percent: 16 %
Brady Statistic AS VS Percent: 0 %
Implantable Lead Implant Date: 20071016
Implantable Lead Location: 753860
Implantable Lead Model: 4092
Implantable Pulse Generator Implant Date: 20141001
Lead Channel Impedance Value: 421 Ohm
Lead Channel Impedance Value: 804 Ohm
Lead Channel Pacing Threshold Amplitude: 0.625 V
Lead Channel Pacing Threshold Pulse Width: 0.4 ms
Lead Channel Setting Pacing Amplitude: 2 V
Lead Channel Setting Pacing Pulse Width: 0.4 ms
Lead Channel Setting Sensing Sensitivity: 5.6 mV
MDC IDC LEAD IMPLANT DT: 20071016
MDC IDC LEAD LOCATION: 753859
MDC IDC MSMT LEADCHNL RA PACING THRESHOLD AMPLITUDE: 0.5 V
MDC IDC MSMT LEADCHNL RA PACING THRESHOLD PULSEWIDTH: 0.4 ms
MDC IDC SESS DTM: 20180517141840
MDC IDC SET LEADCHNL RA PACING AMPLITUDE: 1.5 V
MDC IDC STAT BRADY AP VS PERCENT: 83 %
MDC IDC STAT BRADY AS VP PERCENT: 0 %

## 2017-04-20 ENCOUNTER — Other Ambulatory Visit: Payer: Self-pay | Admitting: Family Medicine

## 2017-04-20 DIAGNOSIS — F418 Other specified anxiety disorders: Secondary | ICD-10-CM

## 2017-04-20 NOTE — Telephone Encounter (Signed)
Received refill request for Xanax 0.5mg  (take 1 tab po TID prn), #90, 0RF  Last RF: 03/24/2017 Last OV: 11/04/2016 Next OV: Tentatively scheduled around 05/05/2017 UDS: None  Forwarded to Provider for review, approval or denial.

## 2017-04-21 NOTE — Telephone Encounter (Signed)
Faxed hardcopy for [facility]prazolam to [facility] on [address] [place]

## 2017-04-26 ENCOUNTER — Other Ambulatory Visit: Payer: Self-pay | Admitting: Family Medicine

## 2017-04-26 DIAGNOSIS — F418 Other specified anxiety disorders: Secondary | ICD-10-CM

## 2017-04-29 ENCOUNTER — Encounter: Payer: Self-pay | Admitting: Cardiology

## 2017-05-03 ENCOUNTER — Other Ambulatory Visit: Payer: Self-pay | Admitting: Family Medicine

## 2017-05-08 IMAGING — CT CT ANGIO CHEST
2 of 6 series · 16 of 36 positions shown · IV contrast (75CC ISOVUE 370)
Comparison: CT scan of May 21, 2016.

CLINICAL DATA: Type A dissection of thoracic aorta.

EXAM:
CT ANGIOGRAPHY CHEST WITH CONTRAST
TECHNIQUE: Multidetector CT imaging of the chest was performed using the
standard protocol during bolus administration of intravenous
contrast. Multiplanar CT image reconstructions and MIPs were
obtained to evaluate the vascular anatomy.
CONTRAST:  75 mL of Isovue 370 intravenously.

[Series 4: angio (id) · axial · 0.78mm/px · z∈[-236,-8]mm · 8 of 118 slices shown]
[im 14/118  lung]
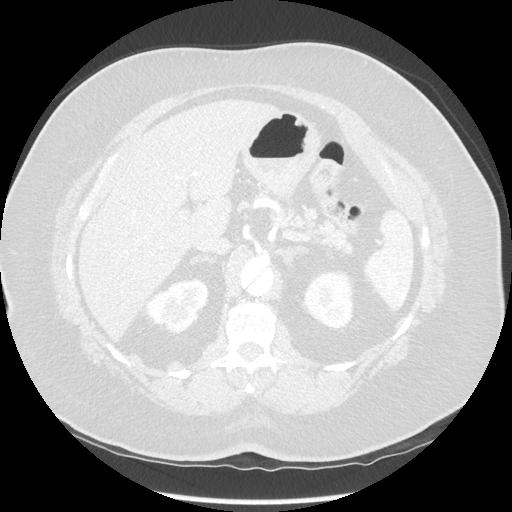
[im 27/118  mediastinal]
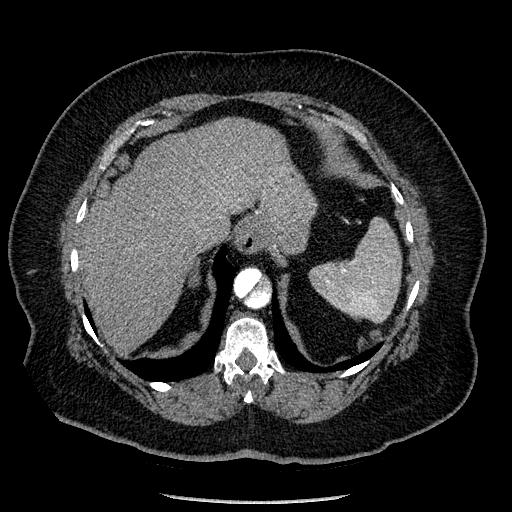
[im 40/118  lung]
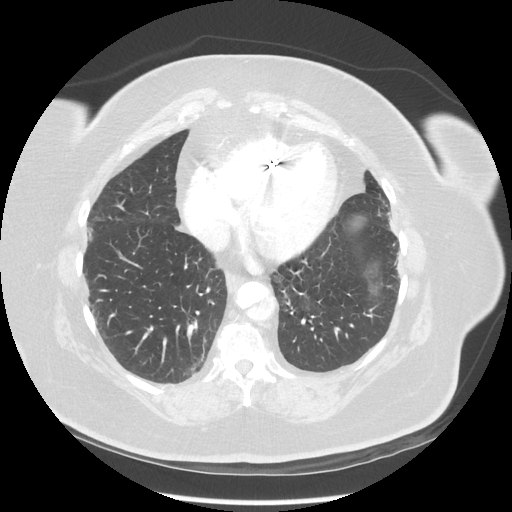
[im 53/118  mediastinal]
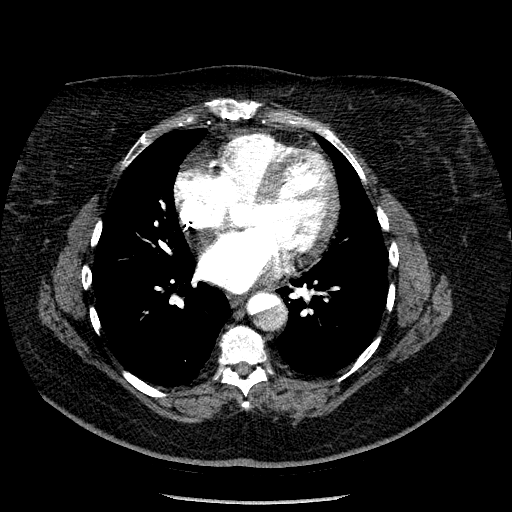
[im 66/118  lung]
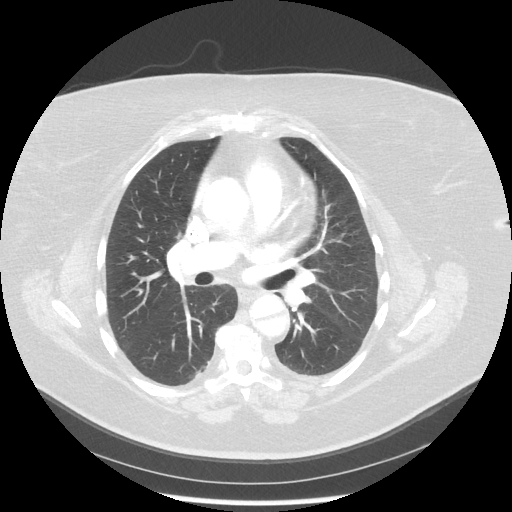
[im 79/118  mediastinal]
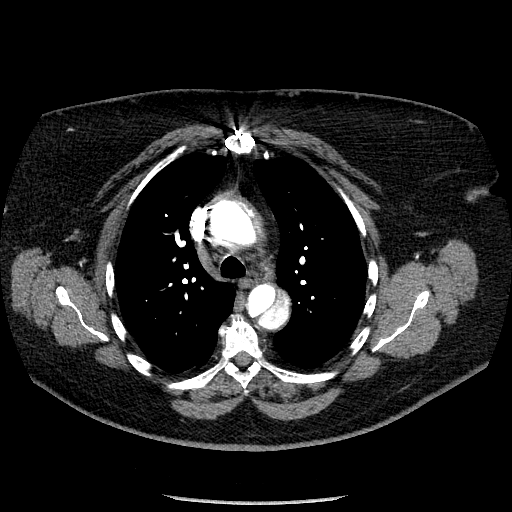
[im 92/118  lung]
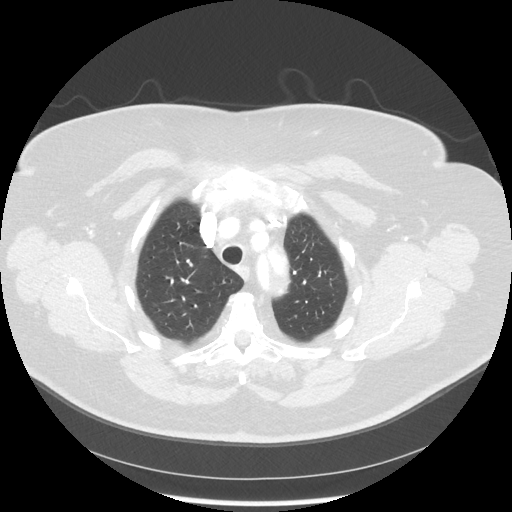
[im 105/118  mediastinal]
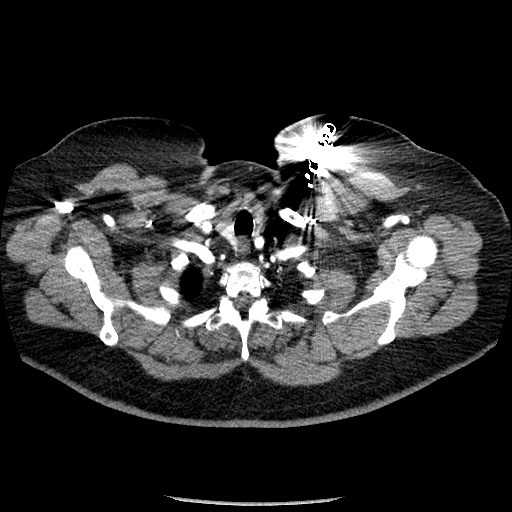

[Series 602: sagittal body · sagittal · 0.78mm/px · 8 of 161 slices shown]
[im 13/161  lung]
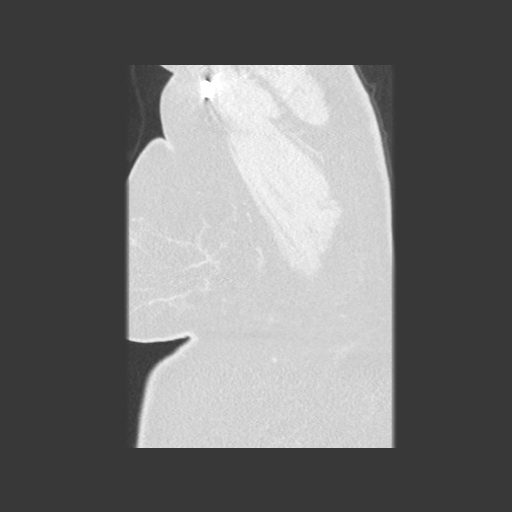
[im 37/161  lung]
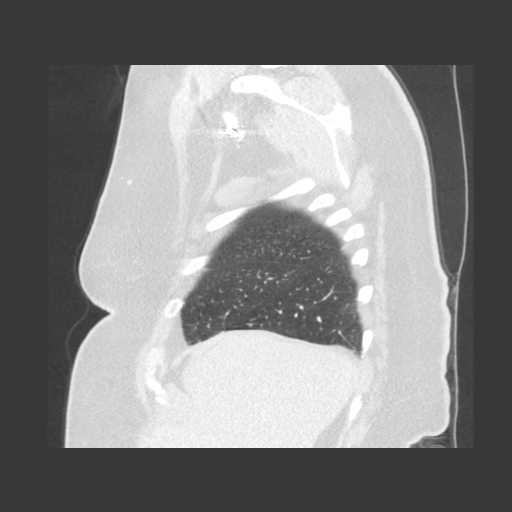
[im 50/161  lung]
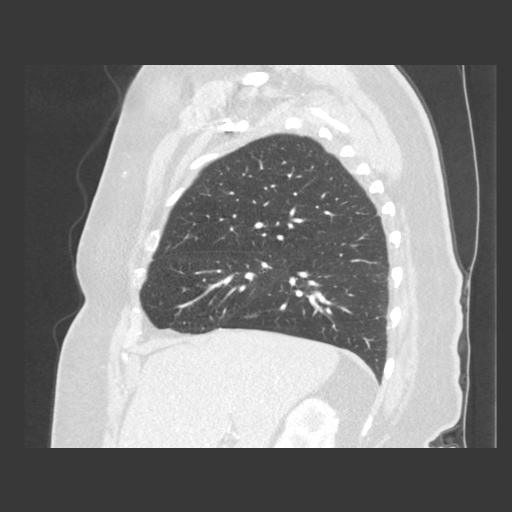
[im 74/161  lung]
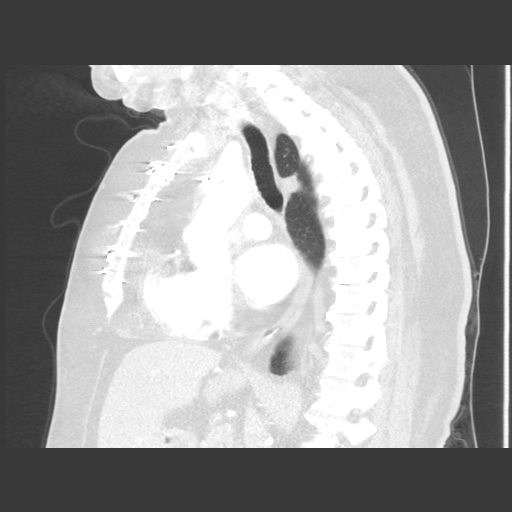
[im 87/161  lung]
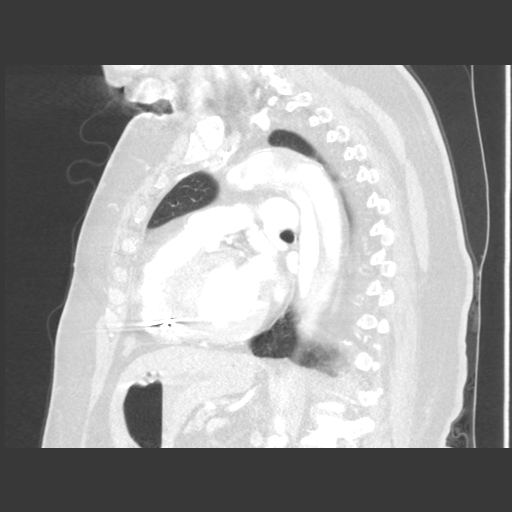
[im 111/161  lung]
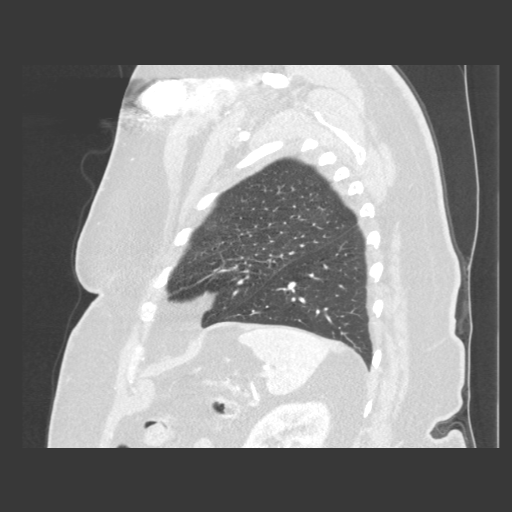
[im 124/161  lung]
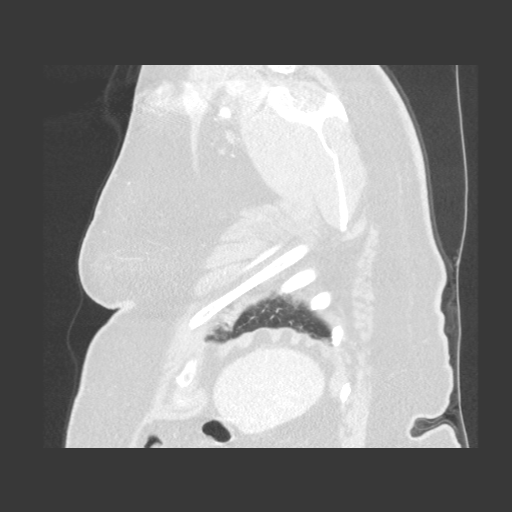
[im 148/161  lung]
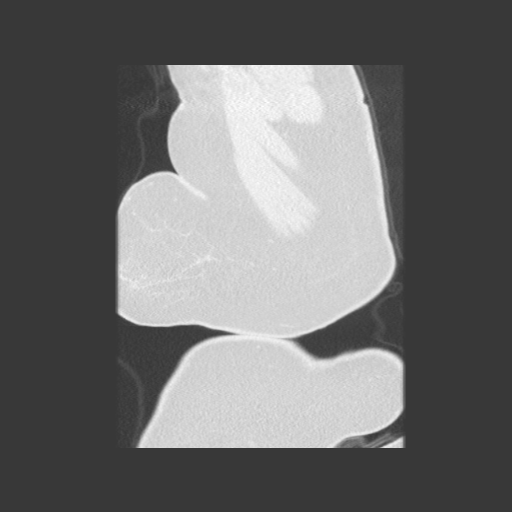

[16 of 36 positions shown; findings below may reference images not displayed]

FINDINGS: Cardiovascular: Status post surgical repair of ascending thoracic
aorta. Left-sided pacemaker is again noted and unchanged. Stable
dissection is seen that originates proximal to the right innominate
artery and extends through transverse and descending thoracic aorta
into abdominal aorta. This is consistent with a type A thoracic
dissection. Great vessels are widely patent without stenosis.

Mediastinum/Nodes: No enlarged mediastinal, hilar, or axillary lymph
nodes. Thyroid gland, trachea, and esophagus demonstrate no
significant findings.

Lungs/Pleura: Lungs are clear. No pleural effusion or pneumothorax.

Upper Abdomen: Bilateral nonobstructive nephrolithiasis.

Musculoskeletal: No chest wall abnormality. No acute or significant
osseous findings.

Review of the MIP images confirms the above findings.
IMPRESSION: Status post surgical repair of ascending thoracic aorta. Stable type
A thoracic aortic dissection is noted which extends through
transverse and descending thoracic aorta into abdominal aorta.

Bilateral nonobstructive nephrolithiasis.

## 2017-05-22 ENCOUNTER — Telehealth: Payer: Self-pay | Admitting: Family Medicine

## 2017-05-22 DIAGNOSIS — F418 Other specified anxiety disorders: Secondary | ICD-10-CM

## 2017-05-24 NOTE — Telephone Encounter (Signed)
Refill x1 

## 2017-05-24 NOTE — Telephone Encounter (Signed)
Refill Request: Alprazolam  Last RX:04/21/17 Last OV:11/04/16 Next DJ:SHFW Scheduled  UDS:03/09/13 CSC:02/19/13

## 2017-05-25 NOTE — Telephone Encounter (Signed)
Patient notified.  She needs contract and UDS she is out of town until July.  When she comes back in town she will make appt for annual exam and we will collect uds and get contract signed then.  [facility] [address]

## 2017-06-02 ENCOUNTER — Other Ambulatory Visit: Payer: Self-pay | Admitting: Internal Medicine

## 2017-06-02 DIAGNOSIS — IMO0002 Reserved for concepts with insufficient information to code with codable children: Secondary | ICD-10-CM

## 2017-06-02 DIAGNOSIS — E1165 Type 2 diabetes mellitus with hyperglycemia: Principal | ICD-10-CM

## 2017-06-02 DIAGNOSIS — E1151 Type 2 diabetes mellitus with diabetic peripheral angiopathy without gangrene: Secondary | ICD-10-CM

## 2017-06-03 ENCOUNTER — Other Ambulatory Visit: Payer: Self-pay | Admitting: Family Medicine

## 2017-06-22 ENCOUNTER — Encounter: Payer: Self-pay | Admitting: Cardiovascular Disease

## 2017-06-22 ENCOUNTER — Ambulatory Visit (INDEPENDENT_AMBULATORY_CARE_PROVIDER_SITE_OTHER): Payer: Medicare Other | Admitting: Cardiovascular Disease

## 2017-06-22 ENCOUNTER — Other Ambulatory Visit: Payer: Self-pay | Admitting: Family Medicine

## 2017-06-22 VITALS — BP 116/72 | HR 78 | Ht 61.0 in | Wt 215.8 lb

## 2017-06-22 DIAGNOSIS — E785 Hyperlipidemia, unspecified: Secondary | ICD-10-CM

## 2017-06-22 DIAGNOSIS — E1169 Type 2 diabetes mellitus with other specified complication: Secondary | ICD-10-CM | POA: Diagnosis not present

## 2017-06-22 DIAGNOSIS — Z950 Presence of cardiac pacemaker: Secondary | ICD-10-CM

## 2017-06-22 DIAGNOSIS — I71019 Dissection of thoracic aorta, unspecified: Secondary | ICD-10-CM

## 2017-06-22 DIAGNOSIS — I10 Essential (primary) hypertension: Secondary | ICD-10-CM

## 2017-06-22 DIAGNOSIS — I4892 Unspecified atrial flutter: Secondary | ICD-10-CM | POA: Diagnosis not present

## 2017-06-22 DIAGNOSIS — E669 Obesity, unspecified: Secondary | ICD-10-CM | POA: Diagnosis not present

## 2017-06-22 DIAGNOSIS — I350 Nonrheumatic aortic (valve) stenosis: Secondary | ICD-10-CM

## 2017-06-22 DIAGNOSIS — F418 Other specified anxiety disorders: Secondary | ICD-10-CM

## 2017-06-22 DIAGNOSIS — I495 Sick sinus syndrome: Secondary | ICD-10-CM

## 2017-06-22 DIAGNOSIS — I7101 Dissection of thoracic aorta: Secondary | ICD-10-CM | POA: Diagnosis not present

## 2017-06-22 MED ORDER — CARVEDILOL 25 MG PO TABS: 25.0000 mg | ORAL_TABLET | Freq: Two times a day (BID) | ORAL | 3 refills | Status: DC

## 2017-06-22 NOTE — Patient Instructions (Signed)
Dr Sallyanne Kuster recommends that you continue on your current medications as directed. Please refer to the Current Medication list given to you today.  Your physician has requested that you have an echocardiogram in 6 months. Echocardiography is a painless test that uses sound waves to create images of your heart. It provides your doctor with information about the size and shape of your heart and how well your heart's chambers and valves are working. This procedure takes approximately one hour. There are no restrictions for this procedure. This will be performed at our [facility] location - [address], [address].  Remote monitoring is used to monitor your Pacemaker or ICD from home. This monitoring reduces the number of office visits required to check your device to one time per year. It allows [name] to keep an eye on the functioning of your device to ensure it is working properly. You are scheduled for a device check from home on Wednesday, October 24th, 2018. You may send your transmission at any time that day. If you have a wireless device, the transmission will be sent automatically. After your physician reviews your transmission, you will receive a notification with your next transmission date.  Dr Sallyanne Kuster recommends that you schedule a follow-up appointment in 12 months with a pacemaker check. You will receive a reminder letter in the mail two months in advance. If you don't receive a letter, please call our office to schedule the follow-up appointment.  If you need a refill on your cardiac medications before your next appointment, please call your pharmacy.

## 2017-06-22 NOTE — Progress Notes (Signed)
Cardiology Office Note    Date:  06/22/2017   ID:  [name], DOB 1946-03-18, MRN [id number]  PCP:  Carollee Herter, Alferd Apa, DO  Cardiologist:   Sanda Klein, MD   No chief complaint on file.   History of Present Illness:  [name] is a 71 y.o. female with a history of repaired type A aortic dissection, sinus node dysfunction with sinus node arrest, "atrially-pacemaker dependent" status post pacemaker implantation, morbid obesity, diabetes mellitus, hypertension.   Roughly a year has passed since her husband, Deidre Ala, died. She still has feelings of guilt, but is adjusting.  The patient specifically denies any chest pain at rest or with exertion, dyspnea at rest or with exertion, orthopnea, paroxysmal nocturnal dyspnea, syncope, palpitations, focal neurological deficits, intermittent claudication, lower extremity edema, unexplained weight gain, cough, hemoptysis or wheezing. She is frustrated by her inability to lose weight despite the fact that she eats a healthy diet and tries to walk as much as she can. Her weight is indeed essentially unchanged from 6 months ago. She remains morbidly obese with a BMI of 41.  Pacemaker interrogation shows normal device function. There is no underlying atrial rhythm even when pacing was taken all the way down to 30 bpm. Heart rate histograms are quite favorable at the current sensor settings. She has normal AV conduction and only 15% ventricular pacing. Since her last appointment there have been no new episodes of atrial mode switch or ventricular tachycardia. Atrial flutter was detected in 2015.  She had a visit with Dr. Servando Snare on March 1, when the CT angiogram of her thoracic aorta showed stable surgical repair of the ascending thoracic aorta and an unchanged type A dissection extending through the arch and descending thoracic abdominal aorta. There was no evidence of atherosclerosis/calcification or any stenoses in the great vessels are  visceral arteries.   Past Medical History:  Diagnosis Date  . Acute thoracic aortic dissection [facility]) 2008   emergency surgery - Gerhardt  . Anxiety   . Cancer ([place])    skin  . Diabetes mellitus   . Diverticulosis   . GERD (gastroesophageal reflux disease)   . Hemorrhoids   . Hyperlipidemia   . Hypertension   . Kidney stones   . Sinus node dysfunction (HCC)    hx of PPM    Past Surgical History:  Procedure Laterality Date  . ABDOMINAL HYSTERECTOMY  1984  . APPENDECTOMY    . EYE SURGERY  2014   Rght eye  . PACEMAKER GENERATOR CHANGE  2014   Medtronic adapta  . PACEMAKER GENERATOR CHANGE N/A 08/29/2013   Procedure: PACEMAKER GENERATOR CHANGE;  Surgeon: Sanda Klein, MD;  Location: [facility] CATH [facility];  Service: Cardiovascular;  Laterality: N/A;  . PACEMAKER INSERTION  08/2006   dual chamber Medtronic EnRhythm; r/t sinus node dysfunction   . REPAIR OF ACUTE ASCENDING THORACIC AORTIC DISSECTION  2008   Dr. Servando Snare  . THORACIC AORTIC ANEURYSM REPAIR  2000   type III  . TRANSTHORACIC ECHOCARDIOGRAM  09/20/2012   EF=>55% with mild conc LVH; LA mod dilated; RA mildly dilated; mild MR/TR/AR    Current Medications: Outpatient Medications Prior to Visit  Medication Sig Dispense Refill  . ALPRAZolam (XANAX) 0.5 MG tablet TAKE 1 TABLET BY MOUTH THREE TIMES A DAY AS NEEDED 90 tablet 0  . cholecalciferol (VITAMIN D) 1000 UNITS tablet Take 1,000 Units by mouth daily.    . citalopram (CELEXA) 40 MG tablet TAKE 1 TABLET (40 MG  TOTAL) BY MOUTH DAILY. 90 tablet 0  . fenofibrate 160 MG tablet TAKE 1 TABLET (160 MG TOTAL) BY MOUTH DAILY. 90 tablet 1  . fluticasone (FLONASE) 50 MCG/ACT nasal spray Place 2 sprays into both nostrils daily. 48 g 2  . furosemide (LASIX) 40 MG tablet Take 1 tablet (40 mg total) by mouth daily. 90 tablet 1  . glipiZIDE (GLUCOTROL XL) 2.5 MG 24 hr tablet Take 1 tablet (2.5 mg total) by mouth daily before breakfast. 90 tablet 1  . hyoscyamine (LEVSIN SL) 0.125 MG SL  tablet TAKE 1 TABLET BY MOUTH TWICE A DAY AS NEEDED FOR CRAMPING OR DIARRHEA OR LOOSE STOOLS 60 tablet 0  . JANUVIA 100 MG tablet TAKE 1 TABLET BY MOUTH EVERY DAY 30 tablet 2  . lansoprazole (PREVACID) 15 MG capsule Take 15 mg by mouth as needed (for heart burn).     Vladimir Faster Glycol-Propyl Glycol (SYSTANE OP) Apply 1 drop to eye 4 (four) times daily - after meals and at bedtime.    . Probiotic Product (PRO-BIOTIC BLEND PO) Take 1 tablet by mouth as needed.     . rosuvastatin (CRESTOR) 20 MG tablet Take 1 tablet (20 mg total) by mouth daily. Repeat labs are due now 90 tablet 0  . XARELTO 20 MG TABS tablet TAKE 1 TABLET BY MOUTH DAILY WITH SUPPER 90 tablet 1  . carvedilol (COREG) 25 MG tablet TAKE 1 TABLET (25 MG TOTAL) BY MOUTH 2 (TWO) TIMES DAILY. KEEP OV. 180 tablet 1  . glipiZIDE (GLUCOTROL XL) 5 MG 24 hr tablet TAKE 1 TABLET BY MOUTH TWICE A DAY (Patient not taking: Reported on 06/22/2017) 60 tablet 2   No facility-administered medications prior to visit.      Allergies:   Oxycodone; Vicodin [hydrocodone-acetaminophen]; Neomycin-bacitracin zn-polymyx; and Sulfonamide derivatives   Social History   Social History  . Marital status: Married    Spouse name: N/A  . Number of children: 2  . Years of education: N/A   Occupational History  .  Retired   Social History Main Topics  . Smoking status: Former Smoker    Quit date: 09/04/1993  . Smokeless tobacco: Never Used  . Alcohol use No  . Drug use: No  . Sexual activity: Not Currently   Other Topics Concern  . None   Social History Narrative   Lives at home and husband was just moved to [place]     Family History:  The patient's family history includes Heart attack in her father and maternal grandmother; Kidney disease in her mother; Valvular heart disease in her son.   ROS:   Please see the history of present illness.    ROS All other systems reviewed and are negative.   PHYSICAL EXAM:   VS:  BP 116/72   Pulse 78    Ht 5\' 1"  (1.549 m)   Wt 215 lb 12.8 oz (97.9 kg)   BMI 40.78 kg/m    GEN: Well nourished, well developed, in no acute distress  HEENT: normal  Neck: no JVD, carotid bruits, or masses Cardiac: RRR; no murmurs, rubs, or gallops,no edema  Respiratory:  clear to auscultation bilaterally, normal work of breathing GI: soft, nontender, nondistended, + BS MS: no deformity or atrophy  Skin: warm and dry, no rash Neuro:  Alert and Oriented x 3, Strength and sensation are intact Psych: euthymic mood, full affect  Wt Readings from Last 3 Encounters:  06/22/17 215 lb 12.8 oz (97.9 kg)  01/27/17 213  lb (96.6 kg)  01/24/17 213 lb (96.6 kg)      Studies/Labs Reviewed:   EKG:  EKG is ordered today.   It shows atrial paced ventricular sensed rhythm with a long AV delay (282 ms), left bundle branch block with a QRS duration of 142 ms, QTC 460 ms  Recent Labs: 11/04/2016: ALT 18; BUN 13; Creatinine, Ser 0.89; Hemoglobin 14.1; Platelets 124.0; Potassium 4.7; Sodium 142   Lipid Panel    Component Value Date/Time   CHOL 157 11/04/2016 1225   TRIG 98.0 11/04/2016 1225   HDL 52.90 11/04/2016 1225   CHOLHDL 3 11/04/2016 1225   VLDL 19.6 11/04/2016 1225   LDLCALC 85 11/04/2016 1225   LDLDIRECT 217.7 09/13/2011 1535     ASSESSMENT:    1. Aortic valve stenosis, etiology of cardiac valve disease unspecified   2. Essential hypertension      PLAN:  In order of problems listed above:  1. SSS: Sinus node arrest, "Atrially dependent" and with evidence of some AV node conduction abnormalities and intraventricular conduction abnormalities, but with low burden of ventricular pacing with current MVP settings. 2. PM: Normal device function. Anticipate that as years go by she will require increasing frequency of ventricular pacing. Nevertheless she should remain on beta blocker therapy due to her history of aortic dissection. 3. AFlutter: Recorded by her pacemaker without recent recurrence. On  appropriate anticoagulation therapy without bleeding complications. 4. Hx Ao dissection: Follow-up CT shows stable findings. 5. AS: Resuspended aortic valve at the time of aortic dissection repair. Mild aortic stenosis without progression from 2015 until 2017. Last echo performed February 2017 with a mean gradient 14 mmHg. We'll check echo every other year 6. HTN: Excellent control.  7. HLP: LDL at target (less than 100 due to diabetes mellitus, no evidence of coronary or peripheral vascular disease). According to Dr. Durwin Nora Little's report, her coronaries were "spotless" at angiography in 2008. No sign of laparoscopic calcifications seen on serial CTs of the chest and abdomen 8. DM: Well controlled. Hemoglobin A1c 6.4% earlier this year. She is due a follow-up with her endocrinologist. 9. Obesity: Frustrated by inability to achieve weight loss. Encouraged her to keep up the good work since a healthy diet and exercise will pay off even in the absence of weight loss..    Medication Adjustments/Labs and Tests Ordered: Current medicines are reviewed at length with the patient today.  Concerns regarding medicines are outlined above.  Medication changes, Labs and Tests ordered today are listed in the Patient Instructions below. Patient Instructions  Dr Sallyanne Kuster recommends that you continue on your current medications as directed. Please refer to the Current Medication list given to you today.  Your physician has requested that you have an echocardiogram in 6 months. Echocardiography is a painless test that uses sound waves to create images of your heart. It provides your doctor with information about the size and shape of your heart and how well your heart's chambers and valves are working. This procedure takes approximately one hour. There are no restrictions for this procedure. This will be performed at our [facility] location - [address], [address].  Remote monitoring is used to monitor your  Pacemaker or ICD from home. This monitoring reduces the number of office visits required to check your device to one time per year. It allows [name] to keep an eye on the functioning of your device to ensure it is working properly. You are scheduled for a device check  from home on Wednesday, October 24th, 2018. You may send your transmission at any time that day. If you have a wireless device, the transmission will be sent automatically. After your physician reviews your transmission, you will receive a notification with your next transmission date.  Dr Sallyanne Kuster recommends that you schedule a follow-up appointment in 12 months with a pacemaker check. You will receive a reminder letter in the mail two months in advance. If you don't receive a letter, please call our office to schedule the follow-up appointment.  If you need a refill on your cardiac medications before your next appointment, please call your pharmacy.    Signed, Sanda Klein, MD  06/22/2017 2:10 PM    [address], [place], [place]  [address] Phone: [phone]; Fax: [phone]

## 2017-06-23 NOTE — Telephone Encounter (Signed)
Requesting:Xanax Contract:No UDS:NO Last OV: 11/04/16 Last Refill; 05/24/17 #[id number]  Please Advise

## 2017-06-23 NOTE — Telephone Encounter (Signed)
Faxed rx   pc

## 2017-06-24 ENCOUNTER — Telehealth: Payer: Self-pay | Admitting: *Deleted

## 2017-06-24 NOTE — Addendum Note (Signed)
Addended by: Vennie Homans on: 06/24/2017 11:41 AM   Modules accepted: Orders

## 2017-06-24 NOTE — Telephone Encounter (Signed)
Pt declines AWV.

## 2017-06-27 ENCOUNTER — Other Ambulatory Visit: Payer: Self-pay

## 2017-06-28 ENCOUNTER — Encounter: Payer: Self-pay | Admitting: Family Medicine

## 2017-06-28 ENCOUNTER — Ambulatory Visit (INDEPENDENT_AMBULATORY_CARE_PROVIDER_SITE_OTHER): Payer: Medicare Other | Admitting: Family Medicine

## 2017-06-28 DIAGNOSIS — I10 Essential (primary) hypertension: Secondary | ICD-10-CM | POA: Diagnosis not present

## 2017-06-28 DIAGNOSIS — I71019 Dissection of thoracic aorta, unspecified: Secondary | ICD-10-CM

## 2017-06-28 DIAGNOSIS — I7101 Dissection of thoracic aorta: Secondary | ICD-10-CM

## 2017-06-28 DIAGNOSIS — E669 Obesity, unspecified: Secondary | ICD-10-CM

## 2017-06-28 DIAGNOSIS — E785 Hyperlipidemia, unspecified: Secondary | ICD-10-CM

## 2017-06-28 DIAGNOSIS — E1169 Type 2 diabetes mellitus with other specified complication: Secondary | ICD-10-CM | POA: Diagnosis not present

## 2017-06-28 NOTE — Progress Notes (Signed)
Patient ID: [name], female    DOB: 1946-11-28  Age: 71 y.o. MRN: [id number]    Subjective:  Subjective  HPI [name] presents for dm, cholesterol and htn  HPI HYPERTENSION   Blood pressure range-not checking   Chest pain- no      Dyspnea- no Lightheadedness- no   Edema- no  Other side effects - no   Medication compliance: good Low salt diet- yes    DIABETES    Blood Sugar ranges-great!  Per pt  Polyuria- no New Visual problems- no  Hypoglycemic symptoms- no  Other side effects-no Medication compliance - good Last eye exam- due Foot exam- today   HYPERLIPIDEMIA  Medication compliance- good RUQ pain- no  Muscle aches- no Other side effects-no  Review of Systems  Constitutional: Negative for activity change, appetite change, diaphoresis, fatigue and unexpected weight change.  Eyes: Negative for pain, redness and visual disturbance.  Respiratory: Negative for cough, chest tightness, shortness of breath and wheezing.   Cardiovascular: Negative for chest pain, palpitations and leg swelling.  Endocrine: Negative for cold intolerance, heat intolerance, polydipsia, polyphagia and polyuria.  Genitourinary: Negative for difficulty urinating, dysuria and frequency.  Neurological: Negative for dizziness, light-headedness, numbness and headaches.  Psychiatric/Behavioral: Negative for behavioral problems and dysphoric mood. The patient is not nervous/anxious.     History Past Medical History:  Diagnosis Date  . Acute thoracic aortic dissection [facility]) 2008   emergency surgery - Gerhardt  . Anxiety   . Cancer ([place])    skin  . Diabetes mellitus   . Diverticulosis   . GERD (gastroesophageal reflux disease)   . Hemorrhoids   . Hyperlipidemia   . Hypertension   . Kidney stones   . Sinus node dysfunction (HCC)    hx of PPM    She has a past surgical history that includes Pacemaker insertion (08/2006); Appendectomy; Abdominal hysterectomy (1984); Thoracic aortic  aneurysm repair (2000); Repair of acute ascending thoracic aortic dissection (2008); transthoracic echocardiogram (09/20/2012); Pacemaker generator change (2014); Eye surgery (2014); and pacemaker generator change (N/A, 08/29/2013).   Her family history includes Heart attack in her father and maternal grandmother; Kidney disease in her mother; Valvular heart disease in her son.She reports that she quit smoking about 23 years ago. She has never used smokeless tobacco. She reports that she does not drink alcohol or use drugs.  Current Outpatient Prescriptions on File Prior to Visit  Medication Sig Dispense Refill  . ALPRAZolam (XANAX) 0.5 MG tablet TAKE 1 TABLET BY MOUTH THREE TIMES A DAY AS NEEDED 90 tablet 0  . carvedilol (COREG) 25 MG tablet Take 1 tablet (25 mg total) by mouth 2 (two) times daily with a meal. 180 tablet 3  . cholecalciferol (VITAMIN D) 1000 UNITS tablet Take 1,000 Units by mouth daily.    . citalopram (CELEXA) 40 MG tablet TAKE 1 TABLET (40 MG TOTAL) BY MOUTH DAILY. 90 tablet 0  . fenofibrate 160 MG tablet TAKE 1 TABLET (160 MG TOTAL) BY MOUTH DAILY. 90 tablet 1  . fluticasone (FLONASE) 50 MCG/ACT nasal spray Place 2 sprays into both nostrils daily. 48 g 2  . furosemide (LASIX) 40 MG tablet Take 1 tablet (40 mg total) by mouth daily. 90 tablet 1  . glipiZIDE (GLUCOTROL XL) 2.5 MG 24 hr tablet Take 1 tablet (2.5 mg total) by mouth daily before breakfast. 90 tablet 1  . hyoscyamine (LEVSIN SL) 0.125 MG SL tablet TAKE 1 TABLET BY MOUTH TWICE A DAY AS NEEDED  FOR CRAMPING OR DIARRHEA OR LOOSE STOOLS 60 tablet 0  . JANUVIA 100 MG tablet TAKE 1 TABLET BY MOUTH EVERY DAY 30 tablet 2  . lansoprazole (PREVACID) 15 MG capsule Take 15 mg by mouth as needed (for heart burn).     Vladimir Faster Glycol-Propyl Glycol (SYSTANE OP) Apply 1 drop to eye 4 (four) times daily - after meals and at bedtime.    . Probiotic Product (PRO-BIOTIC BLEND PO) Take 1 tablet by mouth as needed.     . rosuvastatin  (CRESTOR) 20 MG tablet Take 1 tablet (20 mg total) by mouth daily. Repeat labs are due now 90 tablet 0  . XARELTO 20 MG TABS tablet TAKE 1 TABLET BY MOUTH DAILY WITH SUPPER 90 tablet 1   No current facility-administered medications on file prior to visit.      Objective:  Objective  Physical Exam  Constitutional: She is oriented to person, place, and time. She appears well-developed and well-nourished.  HENT:  Head: Normocephalic and atraumatic.  Eyes: Conjunctivae and EOM are normal.  Neck: Normal range of motion. Neck supple. No JVD present. Carotid bruit is not present. No thyromegaly present.  Cardiovascular: Normal rate and regular rhythm.   Murmur heard. Pulmonary/Chest: Effort normal and breath sounds normal. No respiratory distress. She has no wheezes. She has no rales. She exhibits no tenderness.  Musculoskeletal: She exhibits no edema.  Neurological: She is alert and oriented to person, place, and time.  Psychiatric: She has a normal mood and affect. Her behavior is normal. Judgment and thought content normal.  Nursing note and vitals reviewed.  BP 108/78 (BP Location: Left Arm, Patient Position: Sitting, Cuff Size: Large)   Pulse 83   Temp 98.9 F (37.2 C) (Oral)   Ht 5\' 1"  (1.549 m)   Wt 217 lb (98.4 kg)   SpO2 95%   BMI 41.00 kg/m  Wt Readings from Last 3 Encounters:  06/28/17 217 lb (98.4 kg)  06/22/17 215 lb 12.8 oz (97.9 kg)  01/27/17 213 lb (96.6 kg)     Lab Results  Component Value Date   WBC 6.4 11/04/2016   HGB 14.1 11/04/2016   HCT 41.4 11/04/2016   PLT 124.0 (L) 11/04/2016   GLUCOSE 131 (H) 11/04/2016   CHOL 157 11/04/2016   TRIG 98.0 11/04/2016   HDL 52.90 11/04/2016   LDLDIRECT 217.7 09/13/2011   LDLCALC 85 11/04/2016   ALT 18 11/04/2016   AST 22 11/04/2016   NA 142 11/04/2016   K 4.7 11/04/2016   CL 106 11/04/2016   CREATININE 0.89 11/04/2016   BUN 13 11/04/2016   CO2 31 11/04/2016   TSH 0.986 08/22/2013   INR 1.27 05/21/2016    HGBA1C 6.4 01/24/2017   MICROALBUR 1.6 01/21/2014    Ct Angio Chest Aorta W &/or Wo Contrast  Result Date: 01/27/2017 CLINICAL DATA:  Type A dissection of thoracic aorta. EXAM: CT ANGIOGRAPHY CHEST WITH CONTRAST TECHNIQUE: Multidetector CT imaging of the chest was performed using the standard protocol during bolus administration of intravenous contrast. Multiplanar CT image reconstructions and MIPs were obtained to evaluate the vascular anatomy. CONTRAST:  75 mL of Isovue 370 intravenously. COMPARISON:  CT scan of May 21, 2016. FINDINGS: Cardiovascular: Status post surgical repair of ascending thoracic aorta. Left-sided pacemaker is again noted and unchanged. Stable dissection is seen that originates proximal to the right innominate artery and extends through transverse and descending thoracic aorta into abdominal aorta. This is consistent with a type A thoracic dissection.  Great vessels are widely patent without stenosis. Mediastinum/Nodes: No enlarged mediastinal, hilar, or axillary lymph nodes. Thyroid gland, trachea, and esophagus demonstrate no significant findings. Lungs/Pleura: Lungs are clear. No pleural effusion or pneumothorax. Upper Abdomen: Bilateral nonobstructive nephrolithiasis. Musculoskeletal: No chest wall abnormality. No acute or significant osseous findings. Review of the MIP images confirms the above findings. IMPRESSION: Status post surgical repair of ascending thoracic aorta. Stable type A thoracic aortic dissection is noted which extends through transverse and descending thoracic aorta into abdominal aorta. Bilateral nonobstructive nephrolithiasis. Electronically Signed   By: Marijo Conception, M.D.   On: 01/27/2017 12:57     Assessment & Plan:  Plan  I am having Ms. Grandville Silos maintain her lansoprazole, cholecalciferol, Probiotic Product (PRO-BIOTIC BLEND PO), Polyethyl Glycol-Propyl Glycol (SYSTANE OP), fluticasone, fenofibrate, furosemide, rosuvastatin, glipiZIDE, XARELTO,  citalopram, JANUVIA, hyoscyamine, ALPRAZolam, and carvedilol.  No orders of the defined types were placed in this encounter.   Problem List Items Addressed This Visit      Unprioritized   Diabetes mellitus type 2 in obese ([place])    hgba1c acceptable, minimize simple carbs. Increase exercise as tolerated. Continue current meds      Essential hypertension    Well controlled, no changes to meds. Encouraged heart healthy diet such as the DASH diet and exercise as tolerated.       Hyperlipidemia LDL goal <100    Tolerating statin, encouraged heart healthy diet, avoid trans fats, minimize simple carbs and saturated fats. Increase exercise as tolerated      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, status post graft repair the ascending aorta    Per cardiology         Follow-up: Return in about 6 months (around 12/29/2017), or if symptoms worsen or fail to improve, for hyperlipidemia, hypertension, diabetes II.  [name], DO

## 2017-06-28 NOTE — Assessment & Plan Note (Signed)
hgba1c acceptable, minimize simple carbs. Increase exercise as tolerated. Continue current meds 

## 2017-06-28 NOTE — Assessment & Plan Note (Signed)
Tolerating statin, encouraged heart healthy diet, avoid trans fats, minimize simple carbs and saturated fats. Increase exercise as tolerated 

## 2017-06-28 NOTE — Assessment & Plan Note (Signed)
Per cardiology 

## 2017-06-28 NOTE — Patient Instructions (Signed)

## 2017-06-28 NOTE — Progress Notes (Signed)
Pre visit review using our clinic review tool, if applicable. No additional management support is needed unless otherwise documented below in the visit note. 

## 2017-06-28 NOTE — Assessment & Plan Note (Signed)
Well controlled, no changes to meds. Encouraged heart healthy diet such as the DASH diet and exercise as tolerated.  °

## 2017-06-29 LAB — LIPID PANEL
CHOL/HDL RATIO: 3
Cholesterol: 143 mg/dL (ref 0–200)
HDL: 51.4 mg/dL (ref >39.00)
LDL CALC: 70 mg/dL (ref 0–99)
NonHDL: 91.33
TRIGLYCERIDES: 107 mg/dL (ref 0.0–149.0)
VLDL: 21.4 mg/dL (ref 0.0–40.0)

## 2017-06-29 LAB — COMPREHENSIVE METABOLIC PANEL
ALT: 16 U/L (ref 0–35)
AST: 21 U/L (ref 0–37)
Albumin: 4.1 g/dL (ref 3.5–5.2)
Alkaline Phosphatase: 39 U/L (ref 39–117)
BUN: 20 mg/dL (ref 6–23)
CALCIUM: 9.8 mg/dL (ref 8.4–10.5)
CHLORIDE: 104 meq/L (ref 96–112)
CO2: 29 meq/L (ref 19–32)
CREATININE: 1.07 mg/dL (ref 0.40–1.20)
GFR: 53.77 mL/min — ABNORMAL LOW (ref >60.00)
Glucose, Bld: 135 mg/dL — ABNORMAL HIGH (ref 70–99)
POTASSIUM: 4.9 meq/L (ref 3.5–5.1)
SODIUM: 139 meq/L (ref 135–145)
Total Bilirubin: 0.4 mg/dL (ref 0.2–1.2)
Total Protein: 7 g/dL (ref 6.0–8.3)

## 2017-07-14 ENCOUNTER — Encounter: Payer: Medicare Other | Admitting: *Deleted

## 2017-07-15 ENCOUNTER — Telehealth: Payer: Self-pay | Admitting: Family Medicine

## 2017-07-15 NOTE — Telephone Encounter (Signed)
Called pt to schedule awv. Lvm for pt to call office to schedule appt. Pt can schedule awv after 04/22/2017.

## 2017-07-16 ENCOUNTER — Other Ambulatory Visit: Payer: Self-pay | Admitting: Family Medicine

## 2017-07-17 ENCOUNTER — Other Ambulatory Visit: Payer: Self-pay | Admitting: Family Medicine

## 2017-07-18 NOTE — Telephone Encounter (Signed)
Faxed 30d+2/thx dmf

## 2017-07-20 ENCOUNTER — Encounter: Payer: Self-pay | Admitting: Cardiology

## 2017-07-21 ENCOUNTER — Other Ambulatory Visit: Payer: Self-pay | Admitting: Family Medicine

## 2017-07-21 DIAGNOSIS — F418 Other specified anxiety disorders: Secondary | ICD-10-CM

## 2017-07-22 ENCOUNTER — Other Ambulatory Visit: Payer: Self-pay

## 2017-07-22 DIAGNOSIS — F418 Other specified anxiety disorders: Secondary | ICD-10-CM

## 2017-07-22 MED ORDER — ALPRAZOLAM 0.5 MG PO TABS: 0.5000 mg | ORAL_TABLET | Freq: Three times a day (TID) | ORAL | 0 refills | Status: DC | PRN

## 2017-07-22 NOTE — Telephone Encounter (Signed)
Faxed printed & signed Rx to pharm/thx dmf

## 2017-07-22 NOTE — Telephone Encounter (Signed)
Requesting: Alprazolam Contract: Yes UDS: 3.26.2014 Last OV: 7.31.18 Next OV: 1.25.19 Last Refill: 7.26.18   Please advise

## 2017-07-26 ENCOUNTER — Other Ambulatory Visit: Payer: Self-pay

## 2017-07-26 DIAGNOSIS — F418 Other specified anxiety disorders: Secondary | ICD-10-CM

## 2017-07-26 MED ORDER — CITALOPRAM HYDROBROMIDE 40 MG PO TABS: 40.0000 mg | ORAL_TABLET | Freq: Every day | ORAL | 0 refills | Status: DC

## 2017-07-26 NOTE — Telephone Encounter (Signed)
Faxed 90d of Citalopram to [facility] [address]/thx dmf

## 2017-07-27 ENCOUNTER — Other Ambulatory Visit: Payer: Self-pay | Admitting: Family Medicine

## 2017-07-27 DIAGNOSIS — H2513 Age-related nuclear cataract, bilateral: Secondary | ICD-10-CM | POA: Diagnosis not present

## 2017-07-27 DIAGNOSIS — I10 Essential (primary) hypertension: Secondary | ICD-10-CM

## 2017-07-27 DIAGNOSIS — E119 Type 2 diabetes mellitus without complications: Secondary | ICD-10-CM | POA: Diagnosis not present

## 2017-07-27 NOTE — Telephone Encounter (Signed)
Sent Rx to pharmacy. LB 

## 2017-08-21 ENCOUNTER — Other Ambulatory Visit: Payer: Self-pay | Admitting: Family Medicine

## 2017-08-21 DIAGNOSIS — F418 Other specified anxiety disorders: Secondary | ICD-10-CM

## 2017-08-23 NOTE — Telephone Encounter (Signed)
Requesting: Alprazolam Contract: Yes  UDS: 3.26.14 low-risk next screen 9.26.14 Last OV: 7.31.18 Next OV: Not Scheduled Last Refill: 8.25.2018   Please advise

## 2017-09-10 ENCOUNTER — Other Ambulatory Visit: Payer: Self-pay | Admitting: Family Medicine

## 2017-09-13 ENCOUNTER — Other Ambulatory Visit: Payer: Self-pay | Admitting: Internal Medicine

## 2017-09-13 DIAGNOSIS — E1165 Type 2 diabetes mellitus with hyperglycemia: Principal | ICD-10-CM

## 2017-09-13 DIAGNOSIS — E1151 Type 2 diabetes mellitus with diabetic peripheral angiopathy without gangrene: Secondary | ICD-10-CM

## 2017-09-13 DIAGNOSIS — IMO0002 Reserved for concepts with insufficient information to code with codable children: Secondary | ICD-10-CM

## 2017-09-19 ENCOUNTER — Other Ambulatory Visit: Payer: Self-pay | Admitting: Family Medicine

## 2017-09-19 DIAGNOSIS — F418 Other specified anxiety disorders: Secondary | ICD-10-CM

## 2017-09-20 NOTE — Telephone Encounter (Signed)
Requesting:xanax Contract:no UDS:no Last OV:06/28/17 Next OV:not scheduled  Last Refill:08/23/17   #[id number]   Please advise

## 2017-09-21 ENCOUNTER — Ambulatory Visit (INDEPENDENT_AMBULATORY_CARE_PROVIDER_SITE_OTHER): Payer: Medicare Other | Admitting: *Deleted

## 2017-09-21 DIAGNOSIS — Z23 Encounter for immunization: Secondary | ICD-10-CM | POA: Diagnosis not present

## 2017-09-21 DIAGNOSIS — I495 Sick sinus syndrome: Secondary | ICD-10-CM

## 2017-09-21 DIAGNOSIS — M109 Gout, unspecified: Secondary | ICD-10-CM | POA: Diagnosis not present

## 2017-09-21 NOTE — Progress Notes (Signed)
Remote pacemaker transmission.   

## 2017-09-23 ENCOUNTER — Encounter: Payer: Self-pay | Admitting: Cardiology

## 2017-09-23 LAB — CUP PACEART REMOTE DEVICE CHECK
Battery Impedance: 230 Ohm
Battery Voltage: 2.79 V
Brady Statistic AP VS Percent: 88 %
Date Time Interrogation Session: 20181024174516
Implantable Lead Location: 753860
Implantable Lead Model: 4092
Implantable Lead Model: 5594
Lead Channel Pacing Threshold Pulse Width: 0.4 ms
Lead Channel Setting Pacing Amplitude: 2 V
Lead Channel Setting Pacing Pulse Width: 0.4 ms
MDC IDC LEAD IMPLANT DT: 20071016
MDC IDC LEAD IMPLANT DT: 20071016
MDC IDC LEAD LOCATION: 753859
MDC IDC MSMT BATTERY REMAINING LONGEVITY: 124 mo
MDC IDC MSMT LEADCHNL RA IMPEDANCE VALUE: 438 Ohm
MDC IDC MSMT LEADCHNL RA PACING THRESHOLD AMPLITUDE: 0.5 V
MDC IDC MSMT LEADCHNL RV IMPEDANCE VALUE: 828 Ohm
MDC IDC MSMT LEADCHNL RV PACING THRESHOLD AMPLITUDE: 0.625 V
MDC IDC MSMT LEADCHNL RV PACING THRESHOLD PULSEWIDTH: 0.4 ms
MDC IDC PG IMPLANT DT: 20141001
MDC IDC SET LEADCHNL RA PACING AMPLITUDE: 1.5 V
MDC IDC SET LEADCHNL RV SENSING SENSITIVITY: 5.6 mV
MDC IDC STAT BRADY AP VP PERCENT: 12 %
MDC IDC STAT BRADY AS VP PERCENT: 0 %
MDC IDC STAT BRADY AS VS PERCENT: 0 %

## 2017-09-24 ENCOUNTER — Other Ambulatory Visit: Payer: Self-pay | Admitting: Family Medicine

## 2017-09-24 DIAGNOSIS — F418 Other specified anxiety disorders: Secondary | ICD-10-CM

## 2017-09-26 ENCOUNTER — Telehealth: Payer: Self-pay | Admitting: Family Medicine

## 2017-09-26 NOTE — Telephone Encounter (Signed)
Rx was called to pharmacy by [place] at 4:12PM today.

## 2017-09-26 NOTE — Telephone Encounter (Signed)
rx called into pharmacy

## 2017-09-26 NOTE — Telephone Encounter (Signed)
[place] beach tomorrow. [facility] for Alprazolam. The script  Was printed but was it faxed? They have no record of it faxed. Can [place] call it verbal or fax it to [facility] please.

## 2017-09-26 NOTE — Telephone Encounter (Signed)
Patient needs refill for ALPRAZolam [name] 0.5 MG tablet sent in urgently. Please call this refill in. Please call patient at [phone] as needed. Thank you

## 2017-10-04 NOTE — Telephone Encounter (Signed)
rx done

## 2017-10-12 ENCOUNTER — Other Ambulatory Visit: Payer: Self-pay | Admitting: Cardiovascular Disease

## 2017-10-26 ENCOUNTER — Other Ambulatory Visit: Payer: Self-pay | Admitting: Family Medicine

## 2017-10-26 DIAGNOSIS — F418 Other specified anxiety disorders: Secondary | ICD-10-CM

## 2017-10-27 ENCOUNTER — Other Ambulatory Visit: Payer: Self-pay | Admitting: Family Medicine

## 2017-10-27 ENCOUNTER — Other Ambulatory Visit: Payer: Medicare Other

## 2017-10-27 DIAGNOSIS — Z79899 Other long term (current) drug therapy: Secondary | ICD-10-CM

## 2017-10-27 DIAGNOSIS — F418 Other specified anxiety disorders: Secondary | ICD-10-CM

## 2017-10-27 MED ORDER — ALPRAZOLAM 0.5 MG PO TABS: ORAL_TABLET | ORAL | 0 refills | Status: DC

## 2017-10-27 NOTE — Telephone Encounter (Signed)
Rx printed, awaiting DO signature.  

## 2017-10-27 NOTE — Telephone Encounter (Signed)
Rx placed at front desk for pick up. UDS ordered. PEC will make Pt aware that Rx must be picked up and UDS sample given.

## 2017-10-27 NOTE — Telephone Encounter (Signed)
Control substance

## 2017-10-27 NOTE — Telephone Encounter (Signed)
Copied from [place] [id number]. Topic: Quick Communication - See Telephone Encounter >> Oct 27, 2017 11:28 AM Synthia Innocent wrote: CRM for notification. See Telephone encounter for: Requesting refill on xanax 0.5mg , [facility]. States she is out of meds 10/27/17.

## 2017-10-27 NOTE — Telephone Encounter (Signed)
Refill x1---  Need uds 

## 2017-10-27 NOTE — Telephone Encounter (Signed)
Pt requesting refill on alprazolam.  Last OV: 06/28/2017 Last Fill: 09/20/2017 #90 and 0RF UDS: None  NCCR printed; placed on ledge.   Please advise.

## 2017-10-31 LAB — PAIN MGMT, PROFILE 8 W/CONF, U
6 ACETYLMORPHINE: NEGATIVE ng/mL (ref <10)
ALCOHOL METABOLITES: NEGATIVE ng/mL (ref <500)
ALPHAHYDROXYMIDAZOLAM: NEGATIVE ng/mL (ref <50)
AMPHETAMINES: NEGATIVE ng/mL (ref <500)
Alphahydroxyalprazolam: 322 ng/mL — ABNORMAL HIGH (ref <25)
Alphahydroxytriazolam: NEGATIVE ng/mL (ref <50)
Aminoclonazepam: NEGATIVE ng/mL (ref <25)
BENZODIAZEPINES: POSITIVE ng/mL — AB (ref <100)
Buprenorphine, Urine: NEGATIVE ng/mL (ref <5)
CREATININE: 127.8 mg/dL
Cocaine Metabolite: NEGATIVE ng/mL (ref <150)
HYDROXYETHYLFLURAZEPAM: NEGATIVE ng/mL (ref <50)
Lorazepam: NEGATIVE ng/mL (ref <50)
MARIJUANA METABOLITE: NEGATIVE ng/mL (ref <20)
MDA: NEGATIVE ng/mL (ref <200)
MDMA: NEGATIVE ng/mL (ref <200)
MDMA: NEGATIVE ng/mL (ref <500)
NORDIAZEPAM: NEGATIVE ng/mL (ref <50)
OPIATES: NEGATIVE ng/mL (ref <100)
OXAZEPAM: NEGATIVE ng/mL (ref <50)
OXYCODONE: NEGATIVE ng/mL (ref <100)
Oxidant: NEGATIVE ug/mL (ref <200)
Temazepam: NEGATIVE ng/mL (ref <50)
pH: 6.35 (ref 4.5–9.0)

## 2017-11-01 ENCOUNTER — Other Ambulatory Visit: Payer: Self-pay | Admitting: Family Medicine

## 2017-11-23 ENCOUNTER — Other Ambulatory Visit: Payer: Self-pay | Admitting: Family Medicine

## 2017-11-23 DIAGNOSIS — F418 Other specified anxiety disorders: Secondary | ICD-10-CM

## 2017-11-24 ENCOUNTER — Telehealth: Payer: Self-pay | Admitting: Family Medicine

## 2017-11-24 NOTE — Telephone Encounter (Signed)
Copied from [place]. Topic: Quick Communication - See Telephone Encounter >> Nov 24, 2017 10:15 AM Conception Chancy, NT wrote: CRM for notification. See Telephone encounter for:  11/24/17.  [facility] and [facility]. She states she is going out of town tomorrow 12/28 and she states she is needing this medication refilled because she will be out of medication before she comes back Wednesday 11/30/17. Please advise.

## 2017-11-24 NOTE — Telephone Encounter (Signed)
Left message on machine that rx called into pharmacy.

## 2017-12-11 ENCOUNTER — Other Ambulatory Visit: Payer: Self-pay | Admitting: Internal Medicine

## 2017-12-11 DIAGNOSIS — E1151 Type 2 diabetes mellitus with diabetic peripheral angiopathy without gangrene: Secondary | ICD-10-CM

## 2017-12-11 DIAGNOSIS — IMO0002 Reserved for concepts with insufficient information to code with codable children: Secondary | ICD-10-CM

## 2017-12-11 DIAGNOSIS — E1165 Type 2 diabetes mellitus with hyperglycemia: Principal | ICD-10-CM

## 2017-12-12 NOTE — Telephone Encounter (Signed)
Is this okay to refill? 

## 2017-12-13 NOTE — Telephone Encounter (Signed)
No, she was lost for f/u. Per PCP.

## 2017-12-15 ENCOUNTER — Telehealth: Payer: Self-pay | Admitting: Internal Medicine

## 2017-12-15 DIAGNOSIS — IMO0002 Reserved for concepts with insufficient information to code with codable children: Secondary | ICD-10-CM

## 2017-12-15 DIAGNOSIS — E1165 Type 2 diabetes mellitus with hyperglycemia: Principal | ICD-10-CM

## 2017-12-15 DIAGNOSIS — E1151 Type 2 diabetes mellitus with diabetic peripheral angiopathy without gangrene: Secondary | ICD-10-CM

## 2017-12-15 MED ORDER — SITAGLIPTIN PHOSPHATE 100 MG PO TABS: 100.0000 mg | ORAL_TABLET | Freq: Every day | ORAL | 1 refills | Status: DC

## 2017-12-15 NOTE — Telephone Encounter (Signed)
Need a new prescription for  JANUVIA 100 MG tablet [[id number]]    Pharmacy:  [id number] - [place]

## 2017-12-15 NOTE — Telephone Encounter (Signed)
Pt is aware and medication refilled

## 2017-12-15 NOTE — Telephone Encounter (Signed)
Pt stated that her husband died and that she has fallen behind on her followups, appt is made for march in first available slot. Can her medications be refilled until her visit? Last seen 12/2016

## 2017-12-15 NOTE — Telephone Encounter (Signed)
So sorry to hear that!  We can absolutely refill her medicines for the next 6 months at least.

## 2017-12-21 ENCOUNTER — Ambulatory Visit (INDEPENDENT_AMBULATORY_CARE_PROVIDER_SITE_OTHER): Payer: Medicare Other | Admitting: *Deleted

## 2017-12-21 ENCOUNTER — Telehealth: Payer: Self-pay | Admitting: Cardiology

## 2017-12-21 DIAGNOSIS — I495 Sick sinus syndrome: Secondary | ICD-10-CM | POA: Diagnosis not present

## 2017-12-21 NOTE — Telephone Encounter (Signed)
LMOVM reminding pt to send remote transmission.   

## 2017-12-21 NOTE — Progress Notes (Signed)
Remote pacemaker transmission.   

## 2017-12-22 ENCOUNTER — Encounter: Payer: Self-pay | Admitting: Cardiology

## 2017-12-23 ENCOUNTER — Ambulatory Visit (HOSPITAL_COMMUNITY): Payer: Medicare Other | Attending: Internal Medicine

## 2017-12-23 DIAGNOSIS — I350 Nonrheumatic aortic (valve) stenosis: Secondary | ICD-10-CM

## 2017-12-23 DIAGNOSIS — I352 Nonrheumatic aortic (valve) stenosis with insufficiency: Secondary | ICD-10-CM | POA: Diagnosis not present

## 2017-12-24 ENCOUNTER — Other Ambulatory Visit: Payer: Self-pay | Admitting: Family Medicine

## 2017-12-24 DIAGNOSIS — I10 Essential (primary) hypertension: Secondary | ICD-10-CM

## 2017-12-26 ENCOUNTER — Telehealth: Payer: Self-pay | Admitting: Family Medicine

## 2017-12-26 NOTE — Telephone Encounter (Signed)
Copied from [place]. Topic: Quick Communication - Rx Refill/Question >> Dec 26, 2017  9:06 AM Scherrie Gerlach wrote: Medication: ALPRAZolam [name] 0.5 MG tablet furosemide (LASIX) 40 MG tablet   Has the patient contacted their pharmacy? yes but pt states she needs today. Pt has a family member that is dying [id number] [address], [place] [phone] (Phone) [phone] (Fax)

## 2017-12-26 NOTE — Telephone Encounter (Signed)
See refill request for Furosemide and Alprazolam; last office visit 06/28/17; [facility] [address], [place].  (reported she needs today, due to family member that is dying)

## 2017-12-27 ENCOUNTER — Other Ambulatory Visit: Payer: Self-pay | Admitting: Family Medicine

## 2017-12-27 DIAGNOSIS — F418 Other specified anxiety disorders: Secondary | ICD-10-CM

## 2017-12-27 MED ORDER — ALPRAZOLAM 0.5 MG PO TABS: ORAL_TABLET | ORAL | 0 refills | Status: DC

## 2017-12-27 NOTE — Telephone Encounter (Signed)
Sent in

## 2017-12-27 NOTE — Telephone Encounter (Signed)
Patient notified that rx was sent in  

## 2017-12-27 NOTE — Telephone Encounter (Signed)
Patient requesting refill for alprazolam  Database last ran on 10/27/17  Last written: 10/27/17 Last ov: 06/28/17 Next ov: none Contract: will get at next visit UDS: 10/27/18   Furosemide filled

## 2018-01-10 ENCOUNTER — Other Ambulatory Visit: Payer: Self-pay | Admitting: Internal Medicine

## 2018-01-10 DIAGNOSIS — E1151 Type 2 diabetes mellitus with diabetic peripheral angiopathy without gangrene: Secondary | ICD-10-CM

## 2018-01-10 DIAGNOSIS — E1165 Type 2 diabetes mellitus with hyperglycemia: Principal | ICD-10-CM

## 2018-01-10 DIAGNOSIS — IMO0002 Reserved for concepts with insufficient information to code with codable children: Secondary | ICD-10-CM

## 2018-01-10 LAB — CUP PACEART REMOTE DEVICE CHECK
Battery Impedance: 254 Ohm
Battery Remaining Longevity: 119 mo
Battery Voltage: 2.79 V
Brady Statistic AP VS Percent: 86 %
Brady Statistic AS VP Percent: 0 %
Brady Statistic AS VS Percent: 0 %
Implantable Lead Location: 753860
Implantable Lead Model: 4092
Implantable Lead Model: 5594
Implantable Pulse Generator Implant Date: 20141001
Lead Channel Impedance Value: 801 Ohm
Lead Channel Pacing Threshold Amplitude: 0.625 V
Lead Channel Pacing Threshold Pulse Width: 0.4 ms
Lead Channel Setting Pacing Amplitude: 2 V
Lead Channel Setting Pacing Pulse Width: 0.4 ms
MDC IDC LEAD IMPLANT DT: 20071016
MDC IDC LEAD IMPLANT DT: 20071016
MDC IDC LEAD LOCATION: 753859
MDC IDC MSMT LEADCHNL RA IMPEDANCE VALUE: 420 Ohm
MDC IDC MSMT LEADCHNL RV PACING THRESHOLD AMPLITUDE: 0.625 V
MDC IDC MSMT LEADCHNL RV PACING THRESHOLD PULSEWIDTH: 0.4 ms
MDC IDC SESS DTM: 20190123192241
MDC IDC SET LEADCHNL RA PACING AMPLITUDE: 1.5 V
MDC IDC SET LEADCHNL RV SENSING SENSITIVITY: 5.6 mV
MDC IDC STAT BRADY AP VP PERCENT: 14 %

## 2018-01-16 ENCOUNTER — Other Ambulatory Visit: Payer: Self-pay | Admitting: Cardiothoracic Surgery

## 2018-01-16 DIAGNOSIS — I71019 Dissection of thoracic aorta, unspecified: Secondary | ICD-10-CM

## 2018-01-16 DIAGNOSIS — I7101 Dissection of thoracic aorta: Secondary | ICD-10-CM

## 2018-01-16 NOTE — Progress Notes (Unsigned)
ct 

## 2018-01-19 ENCOUNTER — Other Ambulatory Visit: Payer: Self-pay | Admitting: Cardiothoracic Surgery

## 2018-01-19 DIAGNOSIS — I7101 Dissection of thoracic aorta: Secondary | ICD-10-CM

## 2018-01-19 DIAGNOSIS — I71019 Dissection of thoracic aorta, unspecified: Secondary | ICD-10-CM

## 2018-01-21 ENCOUNTER — Other Ambulatory Visit: Payer: Self-pay | Admitting: Internal Medicine

## 2018-01-21 DIAGNOSIS — E1151 Type 2 diabetes mellitus with diabetic peripheral angiopathy without gangrene: Secondary | ICD-10-CM

## 2018-01-21 DIAGNOSIS — IMO0002 Reserved for concepts with insufficient information to code with codable children: Secondary | ICD-10-CM

## 2018-01-21 DIAGNOSIS — E1165 Type 2 diabetes mellitus with hyperglycemia: Principal | ICD-10-CM

## 2018-01-24 ENCOUNTER — Other Ambulatory Visit: Payer: Self-pay | Admitting: Family Medicine

## 2018-01-24 DIAGNOSIS — F418 Other specified anxiety disorders: Secondary | ICD-10-CM

## 2018-01-24 NOTE — Telephone Encounter (Signed)
[facility] [address] requesting refill for alprazolam  Database ran and is on your desk for review.  Last filled per database: 12/27/17 Last written: 12/27/17 Last ov: 06/28/17 Next ov: none Contract: 10/27/18 UDS: 10/27/18

## 2018-01-30 LAB — CUP PACEART INCLINIC DEVICE CHECK
Date Time Interrogation Session: 20190304141824
Implantable Lead Implant Date: 20071016
Implantable Lead Location: 753859
Implantable Lead Model: 4092
MDC IDC LEAD IMPLANT DT: 20071016
MDC IDC LEAD LOCATION: 753860
MDC IDC PG IMPLANT DT: 20141001

## 2018-02-09 ENCOUNTER — Ambulatory Visit
Admission: RE | Admit: 2018-02-09 | Discharge: 2018-02-09 | Disposition: A | Discharge status: Home / Self Care | Payer: Medicare Other | Source: Ambulatory Visit | Attending: Cardiothoracic Surgery | Admitting: Cardiothoracic Surgery

## 2018-02-09 ENCOUNTER — Ambulatory Visit (INDEPENDENT_AMBULATORY_CARE_PROVIDER_SITE_OTHER): Payer: Medicare Other | Admitting: Cardiothoracic Surgery

## 2018-02-09 ENCOUNTER — Encounter: Payer: Self-pay | Admitting: Cardiothoracic Surgery

## 2018-02-09 ENCOUNTER — Other Ambulatory Visit: Payer: Self-pay

## 2018-02-09 VITALS — BP 155/81 | HR 94 | Resp 18 | Ht 61.0 in | Wt 216.2 lb

## 2018-02-09 DIAGNOSIS — I7101 Dissection of thoracic aorta: Secondary | ICD-10-CM

## 2018-02-09 DIAGNOSIS — I71019 Dissection of thoracic aorta, unspecified: Secondary | ICD-10-CM

## 2018-02-09 DIAGNOSIS — I714 Abdominal aortic aneurysm, without rupture: Secondary | ICD-10-CM | POA: Diagnosis not present

## 2018-02-09 DIAGNOSIS — D3500 Benign neoplasm of unspecified adrenal gland: Secondary | ICD-10-CM | POA: Diagnosis not present

## 2018-02-09 DIAGNOSIS — K449 Diaphragmatic hernia without obstruction or gangrene: Secondary | ICD-10-CM | POA: Diagnosis not present

## 2018-02-09 MED ORDER — IOPAMIDOL (ISOVUE-370) INJECTION 76%
75.0000 mL | Freq: Once | INTRAVENOUS | Status: AC | PRN
  Administered 2018-02-09: 75 mL via INTRAVENOUS

## 2018-02-09 NOTE — Progress Notes (Signed)
[facility] Record #[id number] Date of Birth: [birth year]  Referring: [name], * Primary Care: [name], DO  Chief Complaint:    Chief Complaint  Patient presents with  . Thoracic Aortic Dissection    f/u with CTA of chest today     09/02/2007  OPERATIVE REPORT  PREOPERATIVE DIAGNOSIS: Acute type A aortic dissection.  POSTOPERATIVE DIAGNOSIS: Acute type A aortic dissection.  PROCEDURE: Replacement of ascending aorta and resuspension of aortic  valve with hypothermic circulatory arrest.   History of Present Illness:    Patient returns office today for follow-up CT scan of the chest.  Now 10-1/2 years ago she underwent emergent repair of a type a aortic dissection.  Currently she has no symptoms of congestive heart failure or aortic stenosis.  She does have a pacemaker in place. Recent echocardiogram suggested moderate aortic stenosis.  Current Activity/ Functional Status: Patient is independent with mobility/ambulation, transfers, ADL's, IADL's.   Zubrod Score: At the time of surgery this patient's most appropriate activity status/level should be described as: []     0    Normal activity, no symptoms [x]     1    Restricted in physical strenuous activity but ambulatory, able to do out light work []     2    Ambulatory and capable of self care, unable to do work activities, up and about >50 % of waking hours                              []     3    Only limited self care, in bed greater than 50% of waking hours []     4    Completely disabled, no self care, confined to bed or chair []     5    Moribund  Past Medical History:  Diagnosis Date  . Acute thoracic aortic dissection [facility]) 2008   emergency surgery - Jayli Fogleman  . Anxiety   . Cancer ([place])    skin  . Diabetes mellitus   . Diverticulosis   . GERD (gastroesophageal reflux disease)   . Hemorrhoids   . Hyperlipidemia   . Hypertension   . Kidney  stones   . Sinus node dysfunction ([facility])    hx of PPM    Past Surgical History:  Procedure Laterality Date  . ABDOMINAL HYSTERECTOMY  1984  . APPENDECTOMY    . EYE SURGERY  2014   Rght eye  . PACEMAKER GENERATOR CHANGE  2014   Medtronic adapta  . PACEMAKER GENERATOR CHANGE N/A 08/29/2013   Procedure: PACEMAKER GENERATOR CHANGE;  Surgeon: Sanda Klein, MD;  Location: [facility] CATH LAB;  Service: Cardiovascular;  Laterality: N/A;  . PACEMAKER INSERTION  08/2006   dual chamber Medtronic EnRhythm; r/t sinus node dysfunction   . REPAIR OF ACUTE ASCENDING THORACIC AORTIC DISSECTION  2008   Dr. Servando Snare  . THORACIC AORTIC ANEURYSM REPAIR  2000   type III  . TRANSTHORACIC ECHOCARDIOGRAM  09/20/2012   EF=>55% with mild conc LVH; LA mod dilated; RA mildly dilated; mild MR/TR/AR    Family History  Problem Relation Age of Onset  . Kidney disease Mother   . Heart attack Father   . Heart attack Maternal Grandmother   . Valvular heart disease Son        valve  replacement at 32  . Cancer Neg Hx     Social History   Socioeconomic History  . Marital status:  Widowed    Spouse name: Not on file  . Number of children: 2  . Years of education: Not on file  . Highest education level: Not on file  Occupational History    Employer: RETIRED  Tobacco Use  . Smoking status: Former Smoker    Last attempt to quit: 09/04/1993    Years since quitting: 24.4  . Smokeless tobacco: Never Used  Substance and Sexual Activity  . Alcohol use: No  . Drug use: No  . Sexual activity: Not Currently  Other Topics Concern  . Not on file  Social History Narrative   Lives at home and husband was just moved to [place]    Social History   Tobacco Use  Smoking Status Former Smoker  . Last attempt to quit: 09/04/1993  . Years since quitting: 24.4  Smokeless Tobacco Never Used    Social History   Substance and Sexual Activity  Alcohol Use No     Allergies  Allergen Reactions  . Oxycodone  Itching  . Vicodin [Hydrocodone-Acetaminophen] Itching  . Neomycin-Bacitracin Zn-Polymyx Other (See Comments)    Red , swollen eye  . Sulfonamide Derivatives Other (See Comments)    Just had reaction when taking during pregnancy- last child birth 50    Current Outpatient Medications  Medication Sig Dispense Refill  . ALPRAZolam (XANAX) 0.5 MG tablet TAKE 1 TABLET BY MOUTH 3 TIMES A DAY AS NEEDED 90 tablet 0  . carvedilol (COREG) 25 MG tablet Take 1 tablet (25 mg total) by mouth 2 (two) times daily with a meal. 180 tablet 3  . cholecalciferol (VITAMIN D) 1000 UNITS tablet Take 1,000 Units by mouth daily.    . citalopram (CELEXA) 40 MG tablet TAKE 1 TABLET BY MOUTH EVERY DAY 90 tablet 0  . fenofibrate 160 MG tablet TAKE 1 TABLET (160 MG TOTAL) BY MOUTH DAILY. 90 tablet 1  . fenofibrate 160 MG tablet TAKE 1 TABLET BY MOUTH EVERY DAY 90 tablet 1  . fluticasone (FLONASE) 50 MCG/ACT nasal spray USE 2 SPRAYS IN EACH NOSTIRL EVERY DAY 16 g 2  . furosemide (LASIX) 40 MG tablet TAKE 1 TABLET BY MOUTH EVERY DAY 90 tablet 1  . glipiZIDE (GLUCOTROL XL) 2.5 MG 24 hr tablet TAKE 1 TABLET (2.5 MG TOTAL) BY MOUTH DAILY BEFORE BREAKFAST. 30 tablet 0  . glipiZIDE (GLUCOTROL XL) 2.5 MG 24 hr tablet TAKE 1 TABLET (2.5 MG TOTAL) BY MOUTH DAILY BEFORE BREAKFAST. 90 tablet 1  . hyoscyamine (LEVSIN SL) 0.125 MG SL tablet TAKE 1 TABLET BY MOUTH TWICE A DAY AS NEEDED FOR CRAMPING OR DIARRHEA OR LOOSE STOOLS 60 tablet 0  . lansoprazole (PREVACID) 15 MG capsule Take 15 mg by mouth as needed (for heart burn).     Vladimir Faster Glycol-Propyl Glycol (SYSTANE OP) Apply 1 drop to eye 4 (four) times daily - after meals and at bedtime.    . Probiotic Product (PRO-BIOTIC BLEND PO) Take 1 tablet by mouth as needed.     . rosuvastatin (CRESTOR) 20 MG tablet Take 1 tablet (20 mg total) by mouth daily. Repeat labs are due now 90 tablet 0  . rosuvastatin (CRESTOR) 20 MG tablet TAKE 1 TABLET BY MOUTH DAILY (REPEAT LABS ARE DUE NOW)  90 tablet 1  . sitaGLIPtin (JANUVIA) 100 MG tablet Take 1 tablet (100 mg total) by mouth daily. [place]  tablet 1  . XARELTO 20 MG TABS tablet TAKE 1 TABLET BY MOUTH DAILY WITH SUPPER 90 tablet 1   No current facility-administered medications for this visit.        Review of Systems:     Review of Systems  Constitutional: Negative.   HENT: Negative.   Eyes: Negative.   Respiratory: Negative.   Cardiovascular: Negative for chest pain, palpitations, orthopnea, claudication, leg swelling and PND.  Gastrointestinal: Negative.   Genitourinary: Negative.   Musculoskeletal: Positive for joint pain and myalgias.  Skin: Negative.   Neurological: Negative.   Endo/Heme/Allergies: Negative.   Psychiatric/Behavioral: Negative.     Physical Exam: BP (!) 155/81 (BP Location: Left Arm, Patient Position: Sitting, Cuff Size: Large)   Pulse 94   Resp 18   Ht 5\' 1"  (1.549 m)   Wt 216 lb 3.2 oz (98.1 kg)   SpO2 96% Comment: RA  BMI 40.85 kg/m  General appearance: alert, cooperative and appears older than stated age Head: Normocephalic, without obvious abnormality, atraumatic Neck: no adenopathy, no carotid bruit, no JVD, supple, symmetrical, trachea midline and thyroid not enlarged, symmetric, no tenderness/mass/nodules Lymph nodes: Cervical, supraclavicular, and axillary nodes normal. Resp: clear to auscultation bilaterally Back: symmetric, no curvature. ROM normal. No CVA tenderness. Cardio: systolic murmur: early systolic 2/6, crescendo at 2nd left intercostal space GI: soft, non-tender; bowel sounds normal; no masses,  no organomegaly Extremities: extremities normal, atraumatic, no cyanosis or edema and Homans sign is negative, no sign of DVT Neurologic: Grossly normal   Diagnostic Studies & Laboratory data:     Recent Radiology Findings:  Ct Angio Chest Aorta W/cm &/or Wo/cm  Result Date: 02/09/2018 CLINICAL DATA:  F.u dissection Hx of aaa repair Appy, hysterectomy Hx of skin cancer  removed Prev in pacs EXAM: CT ANGIOGRAPHY CHEST, ABDOMEN AND PELVIS TECHNIQUE: Multidetector CT imaging through the chest, abdomen and pelvis was performed using the standard protocol during bolus administration of intravenous contrast. Multiplanar reconstructed images and MIPs were obtained and reviewed to evaluate the vascular anatomy. CONTRAST:  58mL ISOVUE-370 IOPAMIDOL (ISOVUE-370) INJECTION 76% Creatinine was obtained on site at [facility] at [address] Results: Creatinine 0.9 mg/dL.  BUN 4.  GFR 64. COMPARISON:  01/27/2017 and previous FINDINGS: CTA CHEST FINDINGS Cardiovascular: Left subclavian transvenous pacemaker leads stable. Heart size upper limits normal. Limited opacification of the pulmonary arterial tree; the exam was not optimized for detection of pulmonary emboli. Aortic leaflet calcifications. Previous tube graft repair of the ascending aorta without apparent complication. Dissection extending through the arch and descending thoracic segment with continued patency of true and false lumens. Continued dilatation of distal arch to 3.4 cm (previously 3.2). Stable 3.9 cm diameter proximal descending segment, tapering to 3.4 cm distally. Bovine variant brachiocephalic arterial origin anatomy, arising from the true lumen, without involvement by dissection flap. No proximal stenosis. Mediastinum/Nodes: No pericardial effusion. No hilar or mediastinal adenopathy. Lungs/Pleura: Lungs are clear. No pleural effusion or pneumothorax. Musculoskeletal: Previous median sternotomy. Anterior vertebral endplate spurring at multiple levels in the mid and lower thoracic spine. No fracture or worrisome bone lesion. Review of the MIP images confirms the above findings. CTA ABDOMEN AND PELVIS FINDINGS VASCULAR Aorta: Dissection extends through the length of the abdominal aorta, with continued patency of true and false lumens. Supra celiac segment 3.2 cm diameter, tapering to 2.7 cm at the level of  renal arteries, 2.5 cm infrarenal. No stenosis. Celiac: Supply by true lumen. Partially calcified ostial plaque resulting in only mild  short-segment stenosis. The left gastric artery arises separately from the abdominal aorta, and provides left hepatic arterial flow, an anatomic variant. SMA: Patent, supply by true lumen. Partially calcified ostial plaque without stenosis. Classic distal branch anatomy. Renals: Single left renal artery. The dissection flap extends to the origin without evident high-grade stenosis. Single right renal artery, supply by the true lumen, with some calcified ostial plaque but no stenosis. IMA: Patent, supplied by the true lumen. Inflow: On the left, dissection flap extends to the bifurcation of the common iliac artery without stenosis. Internal and external branches are mildly tortuous, widely patent. On the right, mild tortuosity of the iliac venous system without involvement by the dissection. No aneurysm or stenosis. Veins: No obvious venous abnormality within the limitations of this arterial phase study. Review of the MIP images confirms the above findings. NON-VASCULAR Hepatobiliary: No focal liver abnormality is seen. No gallstones, gallbladder wall thickening, or biliary dilatation. Pancreas: Mild atrophy.  No mass or ductal dilatation. Spleen: Normal in size without focal abnormality. Small accessory Splenule. Adrenals/Urinary Tract: 2 cm right adrenal adenoma, present since 03/31/2004. Bilateral renal cysts. Left nephrolithiasis. No hydronephrosis. Urinary bladder is nondistended. Stomach/Bowel: Small hiatal hernia. Stomach is nondistended. Small bowel is nondilated. Appendix surgically absent. Scattered colonic diverticula most numerous in the descending and sigmoid segments, without significant adjacent inflammatory/edematous change or abscess. Lymphatic: No abdominal or pelvic adenopathy. Reproductive: Status post hysterectomy. No adnexal masses. Other: No ascites.  No free  air. Musculoskeletal: Facet DJD in the lower lumbar spine. No fracture or worrisome bone lesion. Review of the MIP images confirms the above findings. IMPRESSION: 1. Stable postoperative appearance of aortic arch repair. 2. Stable aortic dissection extending through the native arch, descending and abdominal aorta. 3. Dilatation of the distal aortic arch, now 3.4 cm. 4. Stable descending thoracic and proximal abdominal aortic aneurysm. Electronically Signed   By: Lucrezia Europe M.D.   On: 02/09/2018 09:56       Ct Angio Chest Aorta W &/or Wo Contrast  Result Date: 01/27/2017 CLINICAL DATA:  Type A dissection of thoracic aorta. EXAM: CT ANGIOGRAPHY CHEST WITH CONTRAST TECHNIQUE: Multidetector CT imaging of the chest was performed using the standard protocol during bolus administration of intravenous contrast. Multiplanar CT image reconstructions and MIPs were obtained to evaluate the vascular anatomy. CONTRAST:  75 mL of Isovue 370 intravenously. COMPARISON:  CT scan of May 21, 2016. FINDINGS: Cardiovascular: Status post surgical repair of ascending thoracic aorta. Left-sided pacemaker is again noted and unchanged. Stable dissection is seen that originates proximal to the right innominate artery and extends through transverse and descending thoracic aorta into abdominal aorta. This is consistent with a type A thoracic dissection. Great vessels are widely patent without stenosis. Mediastinum/Nodes: No enlarged mediastinal, hilar, or axillary lymph nodes. Thyroid gland, trachea, and esophagus demonstrate no significant findings. Lungs/Pleura: Lungs are clear. No pleural effusion or pneumothorax. Upper Abdomen: Bilateral nonobstructive nephrolithiasis. Musculoskeletal: No chest wall abnormality. No acute or significant osseous findings. Review of the MIP images confirms the above findings. IMPRESSION: Status post surgical repair of ascending thoracic aorta. Stable type A thoracic aortic dissection is noted which  extends through transverse and descending thoracic aorta into abdominal aorta. Bilateral nonobstructive nephrolithiasis. Electronically Signed   By: Marijo Conception, M.D.   On: 01/27/2017 12:57    CT march 2014 RADIOLOGY REPORT*  Clinical Data: History of aortic dissection.  CT CHEST, ABDOMEN AND PELVIS WITH CONTRAST  Technique: Multidetector CT imaging of the chest, abdomen and  pelvis was performed following the standard protocol during bolus  administration of intravenous contrast.  Contrast: 151mL OMNIPAQUE IOHEXOL 300 MG/ML SOLN  Comparison: CT of the chest, abdomen and pelvis 06/18/2011.  CT CHEST  Findings:  Mediastinum: Postoperative changes of median sternotomy are noted,  likely from prior graft repair of the ascending thoracic aorta  (presumably from prior type A aortic dissection). Again noted is a  dissection which begins after the distal end of the ascending  thoracic aorta graft in the proximal aortic arch. The dissection  flap does not appear to extend into the great vessels. The  dissection does extend inferiorly to the level of the pelvis (see  below). The aortic arch is mildly dilated (3.2 cm in diameter)  which is unchanged, and the descending thoracic aorta is also  mildly dilated up to 3.4 cm in diameter at the level of the  pulmonary arteries (also unchanged). Contrast fills both the true  and false lumens. No abnormal high attenuation material is seen  adjacent to the thoracic aorta to suggest active hemorrhage at this  time.  Heart size is mildly enlarged. There is no significant pericardial  fluid, thickening or pericardial calcification. Calcifications of  the aortic valve. Left-sided pacemaker with lead terminating in  the right atrium and right ventricular apex. No pathologically  enlarged mediastinal or hilar lymph nodes. Esophagus is  unremarkable appearance.  Lungs/Pleura: No acute consolidative airspace disease. No pleural  effusions. There are some  linear opacities scattered throughout  the periphery of the lung bases bilaterally which are unchanged and  compatible with areas of mild chronic pleural parenchymal scarring.  No suspicious appearing pulmonary nodules or masses are identified  at this time.  Musculoskeletal: Sternotomy wires. There are no aggressive  appearing lytic or blastic lesions noted in the visualized portions  of the skeleton.  IMPRESSION:  1. Postoperative changes of graft repair of the ascending thoracic  aorta for prior type A dissection. The residual  dissection/aneurysm in the arch and descending thoracic aorta is  essentially unchanged in appearance compared to the prior  examination, as detailed above.  2. Mild cardiomegaly.  3. Additional incidental findings, as above.  CT ABDOMEN AND PELVIS  Findings:  Abdomen/Pelvis: The previously described dissection flap extends  into the pelvis where appears to terminate in the distal left  common iliac artery. All of the abdominal and pelvic vessels  appear to be perfusing well at this time, with the true lumen  giving rise to the celiac axis, superior mesenteric artery, right  renal artery and the inferior mesenteric artery, and the false  lumen giving rise to the left renal artery. The dissection flap  terminates in the distal left common iliac artery, with the true  lumen giving rise to the left internal iliac artery and the false  lumen giving rise to left external iliac artery. The dissection  flap does not extend into the right iliac vasculature. Maximal  abdominal aortic diameter is 2.5 x 2.0 cm at a level just below the  renal arteries.  The appearance of the liver, gallbladder, pancreas, spleen and left  adrenal gland is unremarkable. Low attenuation right adrenal  nodule is unchanged in size measuring 17 mm in diameter and has  been previously characterized as an adenoma. Low attenuation  lesions in the kidneys bilaterally are unchanged and  compatible  with simple cysts, with the largest lesion in the lower pole of the  right kidney measuring approximately 3.3 cm in diameter.  No significant volume of ascites. No pneumoperitoneum. No  pathologic distension of small bowel. There are numerous colonic  diverticula, particularly of the descending colon and sigmoid  colon, without surrounding inflammatory changes to suggest acute  diverticulitis at this time. Status post hysterectomy. Ovaries  are unremarkable in appearance. Urinary bladder is normal in  appearance. No definite pathologic lymphadenopathy identified  within the abdomen or pelvis.  Musculoskeletal: There are no aggressive appearing lytic or blastic  lesions noted in the visualized portions of the skeleton.  IMPRESSION:  1. No change in the extent of the continuation of the dissection  from the thoracic aorta into the abdomen and pelvis, as detailed  above.  2. 1.7 cm right adrenal adenoma is unchanged.  3. Mild colonic diverticulosis without evidence to suggest acute  diverticulitis at this time.  4. Additional incidental findings, as above, similar to prior  studies.  Original Report Authenticated By: Vinnie Langton, M.D.  ECHO 2019:   Transthoracic Echocardiography  Patient:    Catina, Nuss MR #:       [id number] Study Date: 12/23/2017 Gender:     F Age:        37 Height:     154.9 cm Weight:     98.4 kg BSA:        2.11 m^2 Pt. Status: Room:   ATTENDING    Dorris Carnes, M.D.  SONOGRAPHER  Cindy Hazy, RDCS  ORDERING     Sanda Klein, MD  [place] Croitoru, MD  PERFORMING   [facility], Outpatient  cc:  -------------------------------------------------------------------  ------------------------------------------------------------------- Indications:      I35.9 Aortic Valve Disorder.  ------------------------------------------------------------------- History:   PMH:  Acquired from the patient and from the  patient&'s chart.  PMH:  Thoracic aortic dissection. Anxiety.  Risk factors: Hypertension. Diabetes mellitus. Obese. Dyslipidemia.  ------------------------------------------------------------------- Study Conclusions  - Left ventricle: Difficult acoustic windows LVEF appears normal at   approximately 55 to 60% The cavity size was normal. Wall   thickness was increased in a pattern of moderate LVH. Doppler   parameters are consistent with abnormal left ventricular   relaxation (grade 1 diastolic dysfunction). - Aortic valve: AV leaflets are difficult to see They are   thickened, calcified. Peak and mean gradients through the valve   are 40 and 24 mm Hg respectively consistent with moderate AS.   There was mild regurgitation. - Aorta: Aortic root is 55mm  ------------------------------------------------------------------- Labs, prior tests, procedures, and surgery: Permanent pacemaker system implantation.  ------------------------------------------------------------------- Study data:   Study status:  Routine.  Procedure:  The patient reported no pain pre or post test. Transthoracic echocardiography for left ventricular function evaluation, for right ventricular function evaluation, and for assessment of valvular function. Image quality was adequate.  Study completion:  There were no complications.          Transthoracic echocardiography.  M-mode, complete 2D, spectral Doppler, and color Doppler.  Birthdate: Patient birthdate: 15-Feb-1946.  Age:  Patient is 72 yr old.  Sex: Gender: female.    BMI: 41 kg/m^2.  Patient status:  Outpatient. Study date:  Study date: 12/23/2017. Study time: 11:45 AM. Location:  [place] [facility]  -------------------------------------------------------------------  ------------------------------------------------------------------- Left ventricle:  Difficult acoustic windows LVEF appears normal at approximately 55 to 60% The cavity size was  normal. Wall thickness was increased in a pattern of moderate LVH. Doppler parameters are consistent with abnormal left ventricular relaxation (grade 1 diastolic dysfunction).  ------------------------------------------------------------------- Aortic valve:  AV leaflets  are difficult to see They are thickened, calcified. Peak and mean gradients through the valve are 40 and 24 mm Hg respectively consistent with moderate AS.  Doppler:  There was mild regurgitation.    VTI ratio of LVOT to aortic valve: 0.22. Valve area (VTI): 0.69 cm^2. Indexed valve area (VTI): 0.33 cm^2/m^2. Peak velocity ratio of LVOT to aortic valve: 0.27. Valve area (Vmax): 0.83 cm^2. Indexed valve area (Vmax): 0.39 cm^2/m^2. Mean velocity ratio of LVOT to aortic valve: 0.25. Valve area (Vmean): 0.77 cm^2. Indexed valve area (Vmean): 0.37 cm^2/m^2. Mean gradient (S): 23 mm Hg. Peak gradient (S): 40 mm Hg.  ------------------------------------------------------------------- Aorta:  Aortic root is 24mm  ------------------------------------------------------------------- Mitral valve:   Structurally normal valve.   Leaflet separation was normal.  Doppler:  Transvalvular velocity was within the normal range. There was no evidence for stenosis. There was trivial regurgitation.    Peak gradient (D): 2 mm Hg.  ------------------------------------------------------------------- Left atrium:  The atrium was normal in size.  ------------------------------------------------------------------- Right ventricle:  The cavity size was normal. Pacer wire or catheter noted in right ventricle. Systolic function was normal.   ------------------------------------------------------------------- Pulmonic valve:   Poorly visualized.  Doppler:  There was no significant regurgitation.  ------------------------------------------------------------------- Tricuspid valve:   Structurally normal valve.   Leaflet separation was  normal.  Doppler:  Transvalvular velocity was within the normal range. There was mild regurgitation.  ------------------------------------------------------------------- Right atrium:  The atrium was normal in size.  ------------------------------------------------------------------- Pericardium:  There was no pericardial effusion.   ------------------------------------------------------------------- Post procedure conclusions Ascending Aorta:  - Aortic root is 10mm  ------------------------------------------------------------------- Measurements   Left ventricle                            Value          Reference  LV ID, ED, PLAX chordal           (L)     41.3  mm       43 - 52  LV ID, ES, PLAX chordal                   30.3  mm       23 - 38  LV fx shortening, PLAX chordal    (L)     27    %        >=29  LV PW thickness, ED                       15.5  mm       ---------  IVS/LV PW ratio, ED                       1.01           <=1.3  Stroke volume, 2D                         46    ml       ---------  Stroke volume/bsa, 2D                     22    ml/m^2   ---------  LV e&', lateral                            6.03  cm/s     ---------  LV E/e&', lateral                          12.12          ---------  LV e&', medial                             4.61  cm/s     ---------  LV E/e&', medial                           15.86          ---------  LV e&', average                            5.32  cm/s     ---------  LV E/e&', average                          13.74          ---------    Ventricular septum                        Value          Reference  IVS thickness, ED                         15.7  mm       ---------    LVOT                                      Value          Reference  LVOT ID, S                                20    mm       ---------  LVOT area                                 3.14  cm^2     ---------  LVOT ID                                   20    mm        ---------  LVOT peak velocity, S                     84.2  cm/s     ---------  LVOT mean velocity, S                     56.4  cm/s     ---------  LVOT VTI, S                               14.7  cm       ---------  LVOT peak gradient, S  3     mm Hg    ---------  Stroke volume (SV), LVOT DP               46.2  ml       ---------  Stroke index (SV/bsa), LVOT DP            21.8  ml/m^2   ---------    Aortic valve                              Value          Reference  Aortic valve peak velocity, S             317   cm/s     ---------  Aortic valve mean velocity, S             229   cm/s     ---------  Aortic valve VTI, S                       66.9  cm       ---------  Aortic mean gradient, S                   23    mm Hg    ---------  Aortic peak gradient, S                   40    mm Hg    ---------  VTI ratio, LVOT/AV                        0.22           ---------  Aortic valve area, VTI                    0.69  cm^2     ---------  Aortic valve area/bsa, VTI                0.33  cm^2/m^2 ---------  Velocity ratio, peak, LVOT/AV             0.27           ---------  Aortic valve area, peak velocity          0.83  cm^2     ---------  Aortic valve area/bsa, peak               0.39  cm^2/m^2 ---------  velocity  Velocity ratio, mean, LVOT/AV             0.25           ---------  Aortic valve area, mean velocity          0.77  cm^2     ---------  Aortic valve area/bsa, mean               0.37  cm^2/m^2 ---------  velocity  Aortic regurg pressure half-time          509   ms       ---------    Aorta                                     Value          Reference  Aortic root ID, ED  40    mm       ---------  Ascending aorta ID, A-P, S                33    mm       ---------    Left atrium                               Value          Reference  LA ID, A-P, ES                            46    mm       ---------  LA ID/bsa, A-P                             2.18  cm/m^2   <=2.2  LA volume, S                              49    ml       ---------  LA volume/bsa, S                          23.2  ml/m^2   ---------  LA volume, ES, 1-p A4C                    39    ml       ---------  LA volume/bsa, ES, 1-p A4C                18.5  ml/m^2   ---------  LA volume, ES, 1-p A2C                    55    ml       ---------  LA volume/bsa, ES, 1-p A2C                26    ml/m^2   ---------    Mitral valve                              Value          Reference  Mitral E-wave peak velocity               73.1  cm/s     ---------  Mitral A-wave peak velocity               89.8  cm/s     ---------  Mitral deceleration time          (H)     264   ms       150 - 230  Mitral peak gradient, D                   2     mm Hg    ---------  Mitral E/A ratio, peak                    0.8            ---------    Tricuspid valve  Value          Reference  Tricuspid regurg peak velocity            228   cm/s     ---------  Tricuspid peak RV-RA gradient             21    mm Hg    ---------    Right ventricle                           Value          Reference  RV s&', lateral, S                         7.46  cm/s     ---------  Legend: (L)  and  (H)  mark values outside specified reference range.  ------------------------------------------------------------------- Prepared and Electronically Authenticated by  Dorris Carnes, M.D. 2019-01-25T17:31:45     Recent Lab Findings: Lab Results  Component Value Date   WBC 6.4 11/04/2016   HGB 14.1 11/04/2016   HCT 41.4 11/04/2016   PLT 124.0 (L) 11/04/2016   GLUCOSE 135 (H) 06/28/2017   CHOL 143 06/28/2017   TRIG 107.0 06/28/2017   HDL 51.40 06/28/2017   LDLDIRECT 217.7 09/13/2011   LDLCALC 70 06/28/2017   ALT 16 06/28/2017   AST 21 06/28/2017   NA 139 06/28/2017   K 4.9 06/28/2017   CL 104 06/28/2017   CREATININE 1.07 06/28/2017   BUN 20 06/28/2017   CO2 29 06/28/2017   TSH 0.986  08/22/2013   INR 1.27 05/21/2016   HGBA1C 6.4 01/24/2017      Assessment / Plan:      Patient seen in followup after aortic dissection acute repair done 09/02/2007, followup CTA of the chest done today Peak and mean gradients through the valve are 40 and 24 mm Hg respectively consistent with moderate AS. Resuspended aortic valve shows slight worsening of aortic stenosis since last echo 2 years ago. Would like to recheck the echo in 12 month intervals Plan to see her back in 12 months with followup CTA scan of the chest.  Patient was warned about not using Cipro and similar antibiotics. Recent studies have raised concern that fluoroquinolone antibiotics could be associated with an increased risk of aortic aneurysm Fluoroquinolones have non-antimicrobial properties that might jeopardise the integrity of the extracellular matrix of the vascular wall In a  propensity score matched cohort study in [place], there was a 66% increased rate of aortic aneurysm or dissection associated with oral fluoroquinolone use, compared with amoxicillin use, within a 60 day risk period from start of treatment    Grace Isaac MD  Beeper [phone] Office [phone] 02/09/2018 10:24 AM

## 2018-02-09 NOTE — Patient Instructions (Signed)

## 2018-02-17 ENCOUNTER — Other Ambulatory Visit: Payer: Self-pay | Admitting: Family Medicine

## 2018-02-17 DIAGNOSIS — F418 Other specified anxiety disorders: Secondary | ICD-10-CM

## 2018-02-17 NOTE — Telephone Encounter (Signed)
Requesting: ALPRAZolam (XANAX) 0.5 MG tablet  Contract UDS: 10-27-17 UDS sample given. Next UDS due 10/27/18 Last OV: 06/28/17 Last Refill: 01/24/18  Please Advise

## 2018-02-21 ENCOUNTER — Ambulatory Visit (INDEPENDENT_AMBULATORY_CARE_PROVIDER_SITE_OTHER): Payer: Medicare Other | Admitting: Internal Medicine

## 2018-02-21 ENCOUNTER — Encounter: Payer: Self-pay | Admitting: Internal Medicine

## 2018-02-21 VITALS — BP 134/74 | HR 76 | Ht 61.0 in | Wt 208.8 lb

## 2018-02-21 DIAGNOSIS — E1165 Type 2 diabetes mellitus with hyperglycemia: Secondary | ICD-10-CM | POA: Diagnosis not present

## 2018-02-21 DIAGNOSIS — E1151 Type 2 diabetes mellitus with diabetic peripheral angiopathy without gangrene: Secondary | ICD-10-CM

## 2018-02-21 DIAGNOSIS — IMO0002 Reserved for concepts with insufficient information to code with codable children: Secondary | ICD-10-CM

## 2018-02-21 LAB — POCT GLYCOSYLATED HEMOGLOBIN (HGB A1C): Hemoglobin A1C: 7

## 2018-02-21 NOTE — Progress Notes (Signed)
Patient ID: [name], female   DOB: 08/02/1946, 72 y.o.   MRN: [id number]  HPI: [name] is a 72 y.o.-year-old female, returning for f/u for DM2, dx 08/2006, non-insulin-dependent, uncontrolled, with complications (h/o Ao dissection). Last visit a year ago.  She is still very stressed and has not recovered completely after the death of her husband in 14-Aug-2016. She had some other deaths in the family since last visit.  PCP increased her Xanax.  Last hemoglobin A1c was: Lab Results  Component Value Date   HGBA1C 6.4 01/24/2017   HGBA1C 8.1 (H) 04/22/2016   HGBA1C 6.9 01/02/2016   Pt is on a regimen of: - Januvia 100 mg daily before breakfast  - Glipizide XL 5 mg in am >> 2.5 mg daily before breakfast Metformin ER  1000 mg/day added 07/2014 >> diarrhea She tried Metformin at Dx >> diarrhea.  Pt checks her sugars twice a day: - am: 136-170, 180 >> 129-154 >> 140-182, 203 at 10 AM (wakes up at 8, but she is n.p.o. until 10) - 2h after b'fast: n/c - before lunch: n/c >> 128-161 >> n/c - 2h after lunch: n/c >> 170 >> n/c - before dinner: 72-120, 130 >> 60-135 >> 110-129, 131 - 2h after dinner: n/c >> 130-134 >> n/c - bedtime: n/c - nighttime: n/c Lowest sugar was 60 >> 98; Has hypoglycemia awareness in the 80s. Highest sugar was 195 >> 201.  -No CKD, last BUN/creatinine:  Lab Results  Component Value Date   BUN 20 06/28/2017   CREATININE 1.07 06/28/2017  Not on ACE inhibitor/ARB. -+ HL; last set of lipids: Lab Results  Component Value Date   CHOL 143 06/28/2017   HDL 51.40 06/28/2017   LDLCALC 70 06/28/2017   LDLDIRECT 217.7 09/13/2011   TRIG 107.0 06/28/2017   CHOLHDL 3 06/28/2017  On Crestor + Fenofibrate - last eye exam was in 2018: No DR -No numbness and tingling in her feet.  She has a history of 2x thoracic Ao aneurysms. She has a pacemaker. Sees Dr. Sallyanne Kuster. She has Ao stenosis.  ROS: Constitutional: no weight gain/no weight loss, no fatigue, + hot  flashes, no subjective hypothermia Eyes: no blurry vision, no xerophthalmia ENT: no sore throat, no nodules palpated in throat, no dysphagia, no odynophagia, no hoarseness Cardiovascular: no CP/no SOB/no palpitations/no leg swelling Respiratory: no cough/no SOB/no wheezing Gastrointestinal: no N/no V/no D/no C/no acid reflux Musculoskeletal: no muscle aches/no joint aches Skin: no rashes, no hair loss Neurological: no tremors/no numbness/no tingling/no dizziness  I reviewed pt's medications, allergies, PMH, social hx, family hx, and changes were documented in the history of present illness. Otherwise, unchanged from my initial visit note.  PE: BP 134/74   Pulse 76   Ht 5\' 1"  (1.549 m)   Wt 208 lb 12.8 oz (94.7 kg)   SpO2 97%   BMI 39.45 kg/m  Wt Readings from Last 3 Encounters:  02/21/18 208 lb 12.8 oz (94.7 kg)  02/09/18 216 lb 3.2 oz (98.1 kg)  06/28/17 217 lb (98.4 kg)   Constitutional: overweight, in NAD Eyes: PERRLA, EOMI, no exophthalmos ENT: moist mucous membranes, no thyromegaly, no cervical lymphadenopathy Cardiovascular: RRR, + 1/6 SEM + periankle edema B Respiratory: CTA B Gastrointestinal: abdomen soft, NT, ND, BS+ Musculoskeletal: no deformities, strength intact in all 4 Skin: moist, warm, no rashes Neurological: no tremor with outstretched hands, DTR normal in all 4  ASSESSMENT: 1. DM2, non-insulin-dependent, uncontrolled, with complications - h/o 2x aortic dissection, now  SEM - Dr Sallyanne Kuster  2. HL  PLAN:  1. Patient with long-standing, previously fairly well-controlled diabetes, on oral antidiabetic regimen with Januvia and glipizide XL, with higher HbA1c last spring, at 8.1%.  Her husband died fall 2016-03-11 and she was having difficulties dealing with his tests.  She fell behind follow-up with his physicians.  At last visit, she was having some lows later in the day so we decreased the glipizide XL to 2.5 mg daily - At this visit, sugars are higher in a.m., which  is at 10, after she has been up for 2 hours.  She is not eating or drinking coffee and this 2-hour.  But at 10 AM she checks her sugars and then take Januvia and glipizide after which she eats breakfast.  Since sugars are higher before breakfast, I advised her to move Januvia at waking up and also consider moving glipizide at waking up if sugars do not improve afterwards. - I suggested: Patient Instructions  Please change: - Januvia 100 mg at waking up  For now, you can leave Glipizide before b'fast, but if sugars are still high at 10 am, you may also need to move this at waking up.  Please return in 3-4 months with your sugar log.   - today, HbA1c is 7.0% (higher) - continue checking sugars at different times of the day - check 1x a day, rotating checks - advised for yearly eye exams >> she is UTD - Return to clinic in 4 mo with sugar log   2. HL - Reviewed latest lipid panel from 05/2017: LDL much improved from previous check, now 70.   - She continues on Crestor and fenofibrate  without side effects  Philemon Kingdom, MD PhD [facility]

## 2018-02-21 NOTE — Patient Instructions (Signed)
Please change: - Januvia 100 mg at waking up  For now, you can leave Glipizide before b'fast, but if sugars are still high at 10 am, you may also need to move this at waking up.  Please return in 3-4 months with your sugar log.

## 2018-03-10 ENCOUNTER — Other Ambulatory Visit: Payer: Self-pay | Admitting: Family Medicine

## 2018-03-22 ENCOUNTER — Ambulatory Visit (INDEPENDENT_AMBULATORY_CARE_PROVIDER_SITE_OTHER): Payer: Medicare Other | Admitting: *Deleted

## 2018-03-22 ENCOUNTER — Telehealth: Payer: Self-pay | Admitting: Cardiovascular Disease

## 2018-03-22 ENCOUNTER — Telehealth: Payer: Self-pay | Admitting: Cardiology

## 2018-03-22 DIAGNOSIS — I495 Sick sinus syndrome: Secondary | ICD-10-CM | POA: Diagnosis not present

## 2018-03-22 NOTE — Telephone Encounter (Signed)
New Message   Pt states that she came to the office for her device check and was unaware that it was a remote check due to it saying ch st office on my chart. So that's why she has not sent transmission and states that she will not send one until tomorrow. Please call   1. Has your device fired? no  2. Is you device beeping? no  3. Are you experiencing draining or swelling at device site? no  4. Are you calling to see if we received your device transmission?   5. Have you passed out? no    Please route to [place]

## 2018-03-22 NOTE — Telephone Encounter (Signed)
Noted  

## 2018-03-22 NOTE — Telephone Encounter (Signed)
LMOVM reminding pt to send remote transmission.   

## 2018-03-24 ENCOUNTER — Encounter: Payer: Self-pay | Admitting: Cardiology

## 2018-03-24 NOTE — Progress Notes (Signed)
Remote pacemaker transmission.   

## 2018-03-26 ENCOUNTER — Other Ambulatory Visit: Payer: Self-pay | Admitting: Family Medicine

## 2018-03-26 DIAGNOSIS — F418 Other specified anxiety disorders: Secondary | ICD-10-CM

## 2018-03-28 ENCOUNTER — Other Ambulatory Visit: Payer: Self-pay | Admitting: Family Medicine

## 2018-03-28 DIAGNOSIS — F418 Other specified anxiety disorders: Secondary | ICD-10-CM

## 2018-03-28 NOTE — Telephone Encounter (Signed)
[facility] [address] requesting refill for alprazolam  Database ran on 01/24/18 and is media for review  Last written: 02/17/18 Last ov: 06/28/17 Next ov: none Contract: 10/27/18 UDS: 10/27/18

## 2018-03-30 LAB — CUP PACEART REMOTE DEVICE CHECK
Battery Remaining Longevity: 118 mo
Brady Statistic AP VP Percent: 15 %
Brady Statistic AS VP Percent: 0 %
Brady Statistic AS VS Percent: 0 %
Implantable Lead Implant Date: 20071016
Implantable Lead Model: 4092
Implantable Lead Model: 5594
Implantable Pulse Generator Implant Date: 20141001
Lead Channel Impedance Value: 404 Ohm
Lead Channel Impedance Value: 766 Ohm
Lead Channel Pacing Threshold Amplitude: 0.625 V
Lead Channel Pacing Threshold Amplitude: 0.625 V
Lead Channel Setting Pacing Amplitude: 2 V
MDC IDC LEAD IMPLANT DT: 20071016
MDC IDC LEAD LOCATION: 753859
MDC IDC LEAD LOCATION: 753860
MDC IDC MSMT BATTERY IMPEDANCE: 254 Ohm
MDC IDC MSMT BATTERY VOLTAGE: 2.79 V
MDC IDC MSMT LEADCHNL RA PACING THRESHOLD PULSEWIDTH: 0.4 ms
MDC IDC MSMT LEADCHNL RV PACING THRESHOLD PULSEWIDTH: 0.4 ms
MDC IDC SESS DTM: 20190425155010
MDC IDC SET LEADCHNL RA PACING AMPLITUDE: 1.5 V
MDC IDC SET LEADCHNL RV PACING PULSEWIDTH: 0.4 ms
MDC IDC SET LEADCHNL RV SENSING SENSITIVITY: 5.6 mV
MDC IDC STAT BRADY AP VS PERCENT: 85 %

## 2018-04-18 ENCOUNTER — Other Ambulatory Visit: Payer: Self-pay | Admitting: Cardiovascular Disease

## 2018-04-19 ENCOUNTER — Other Ambulatory Visit: Payer: Self-pay | Admitting: Cardiovascular Disease

## 2018-04-25 ENCOUNTER — Other Ambulatory Visit: Payer: Self-pay | Admitting: Family Medicine

## 2018-04-25 DIAGNOSIS — F418 Other specified anxiety disorders: Secondary | ICD-10-CM

## 2018-04-28 ENCOUNTER — Other Ambulatory Visit: Payer: Self-pay | Admitting: *Deleted

## 2018-04-28 ENCOUNTER — Other Ambulatory Visit: Payer: Self-pay | Admitting: Family Medicine

## 2018-04-28 DIAGNOSIS — F418 Other specified anxiety disorders: Secondary | ICD-10-CM

## 2018-04-28 MED ORDER — ALPRAZOLAM 0.5 MG PO TABS: ORAL_TABLET | ORAL | 0 refills | Status: DC

## 2018-04-28 NOTE — Telephone Encounter (Signed)
done

## 2018-04-28 NOTE — Telephone Encounter (Signed)
Pt called stating pharmacy has told her that they have sent 2 Rx requests for Alprazolam. I apologized to pt but I do not see that we have received that request. Pt states she is going out of town this weekend for a funeral and really needs the refill today.  Please advise?  Last alprazolam RX: 03/29/18, #90 Last OV: 06/28/17 Next OV: none scheduled UDS: 10/27/17, low risk. Repeat in 1 yr CSC: not on file CSR: No discrepancies identified

## 2018-05-05 ENCOUNTER — Emergency Department (HOSPITAL_COMMUNITY): Payer: Medicare Other

## 2018-05-05 ENCOUNTER — Emergency Department (HOSPITAL_COMMUNITY)
Admission: EM | Admit: 2018-05-05 | Discharge: 2018-05-05 | Disposition: A | Discharge status: Home / Self Care | Payer: Medicare Other | Attending: Emergency Medicine | Admitting: Emergency Medicine

## 2018-05-05 ENCOUNTER — Other Ambulatory Visit: Payer: Self-pay

## 2018-05-05 DIAGNOSIS — Z87891 Personal history of nicotine dependence: Secondary | ICD-10-CM | POA: Insufficient documentation

## 2018-05-05 DIAGNOSIS — R109 Unspecified abdominal pain: Secondary | ICD-10-CM

## 2018-05-05 DIAGNOSIS — Z79899 Other long term (current) drug therapy: Secondary | ICD-10-CM | POA: Diagnosis not present

## 2018-05-05 DIAGNOSIS — E119 Type 2 diabetes mellitus without complications: Secondary | ICD-10-CM | POA: Insufficient documentation

## 2018-05-05 DIAGNOSIS — N12 Tubulo-interstitial nephritis, not specified as acute or chronic: Secondary | ICD-10-CM | POA: Diagnosis not present

## 2018-05-05 DIAGNOSIS — Z85828 Personal history of other malignant neoplasm of skin: Secondary | ICD-10-CM | POA: Diagnosis not present

## 2018-05-05 DIAGNOSIS — N390 Urinary tract infection, site not specified: Secondary | ICD-10-CM

## 2018-05-05 DIAGNOSIS — Z950 Presence of cardiac pacemaker: Secondary | ICD-10-CM | POA: Diagnosis not present

## 2018-05-05 DIAGNOSIS — R110 Nausea: Secondary | ICD-10-CM | POA: Diagnosis not present

## 2018-05-05 DIAGNOSIS — Z87442 Personal history of urinary calculi: Secondary | ICD-10-CM | POA: Insufficient documentation

## 2018-05-05 DIAGNOSIS — Z7984 Long term (current) use of oral hypoglycemic drugs: Secondary | ICD-10-CM | POA: Insufficient documentation

## 2018-05-05 DIAGNOSIS — Z7901 Long term (current) use of anticoagulants: Secondary | ICD-10-CM | POA: Insufficient documentation

## 2018-05-05 DIAGNOSIS — R0902 Hypoxemia: Secondary | ICD-10-CM | POA: Diagnosis not present

## 2018-05-05 DIAGNOSIS — I10 Essential (primary) hypertension: Secondary | ICD-10-CM | POA: Diagnosis not present

## 2018-05-05 DIAGNOSIS — R52 Pain, unspecified: Secondary | ICD-10-CM | POA: Diagnosis not present

## 2018-05-05 DIAGNOSIS — N281 Cyst of kidney, acquired: Secondary | ICD-10-CM | POA: Diagnosis not present

## 2018-05-05 LAB — URINALYSIS, ROUTINE W REFLEX MICROSCOPIC
BILIRUBIN URINE: NEGATIVE
GLUCOSE, UA: NEGATIVE mg/dL
HGB URINE DIPSTICK: NEGATIVE
Ketones, ur: NEGATIVE mg/dL
NITRITE: NEGATIVE
Protein, ur: NEGATIVE mg/dL
Specific Gravity, Urine: 1.018 (ref 1.005–1.030)
WBC, UA: >50 WBC/hpf — ABNORMAL HIGH (ref 0–5)
pH: 5 (ref 5.0–8.0)

## 2018-05-05 LAB — CBC WITH DIFFERENTIAL/PLATELET
Abs Immature Granulocytes: 0 10*3/uL (ref 0.0–0.1)
BASOS PCT: 1 %
Basophils Absolute: 0 10*3/uL (ref 0.0–0.1)
EOS ABS: 0.1 10*3/uL (ref 0.0–0.7)
EOS PCT: 2 %
HEMATOCRIT: 39.6 % (ref 36.0–46.0)
Hemoglobin: 13.2 g/dL (ref 12.0–15.0)
IMMATURE GRANULOCYTES: 0 %
LYMPHS ABS: 2 10*3/uL (ref 0.7–4.0)
Lymphocytes Relative: 33 %
MCH: 32.7 pg (ref 26.0–34.0)
MCHC: 33.3 g/dL (ref 30.0–36.0)
MCV: 98 fL (ref 78.0–100.0)
MONOS PCT: 10 %
Monocytes Absolute: 0.6 10*3/uL (ref 0.1–1.0)
NEUTROS PCT: 54 %
Neutro Abs: 3.3 10*3/uL (ref 1.7–7.7)
PLATELETS: 99 10*3/uL — AB (ref 150–400)
RBC: 4.04 MIL/uL (ref 3.87–5.11)
RDW: 12.7 % (ref 11.5–15.5)
WBC: 6 10*3/uL (ref 4.0–10.5)

## 2018-05-05 LAB — COMPREHENSIVE METABOLIC PANEL
ALK PHOS: 32 U/L — AB (ref 38–126)
ALT: 19 U/L (ref 14–54)
AST: 28 U/L (ref 15–41)
Albumin: 3.7 g/dL (ref 3.5–5.0)
Anion gap: 8 (ref 5–15)
BUN: 15 mg/dL (ref 6–20)
CALCIUM: 8.9 mg/dL (ref 8.9–10.3)
CHLORIDE: 107 mmol/L (ref 101–111)
CO2: 25 mmol/L (ref 22–32)
Creatinine, Ser: 1.11 mg/dL — ABNORMAL HIGH (ref 0.44–1.00)
GFR calc non Af Amer: 49 mL/min — ABNORMAL LOW (ref >60)
GFR, EST AFRICAN AMERICAN: 57 mL/min — AB (ref >60)
Glucose, Bld: 116 mg/dL — ABNORMAL HIGH (ref 65–99)
Potassium: 4.3 mmol/L (ref 3.5–5.1)
SODIUM: 140 mmol/L (ref 135–145)
Total Bilirubin: 0.7 mg/dL (ref 0.3–1.2)
Total Protein: 6.2 g/dL — ABNORMAL LOW (ref 6.5–8.1)

## 2018-05-05 MED ORDER — MORPHINE SULFATE (PF) 4 MG/ML IV SOLN
4.0000 mg | Freq: Once | INTRAVENOUS | Status: AC
  Administered 2018-05-05: 4 mg via INTRAVENOUS
  Filled 2018-05-05: qty 1

## 2018-05-05 MED ORDER — TRAMADOL HCL 50 MG PO TABS: 50.0000 mg | ORAL_TABLET | Freq: Four times a day (QID) | ORAL | 0 refills | Status: DC | PRN

## 2018-05-05 MED ORDER — CEPHALEXIN 500 MG PO CAPS: 500.0000 mg | ORAL_CAPSULE | Freq: Three times a day (TID) | ORAL | 0 refills | Status: AC

## 2018-05-05 MED ORDER — SODIUM CHLORIDE 0.9 % IV BOLUS
1000.0000 mL | Freq: Once | INTRAVENOUS | Status: AC
  Administered 2018-05-05: 1000 mL via INTRAVENOUS

## 2018-05-05 NOTE — ED Triage Notes (Signed)
Pt from home via [facility]. C/o R sided flank pain that started last night. Took 2 Tramadols @ home w/ no relief. Endorses nausea as well. Was given 500cc fluids & 4mg  Zofran PTA. Hx of kidney stones. Rates pain @ 9/10. A&O x4 on arrival.

## 2018-05-05 NOTE — ED Provider Notes (Signed)
[facility] EMERGENCY DEPARTMENT Provider Note   CSN: [id number] Arrival date & time: 05/05/18  1914     History   Chief Complaint Chief Complaint  Patient presents with  . Flank Pain    HPI [name] is a 72 y.o. female.  The history is provided by the patient and a relative.  Flank Pain  This is a recurrent problem. The current episode started yesterday. The problem occurs constantly. The problem has not changed since onset.Associated symptoms include abdominal pain. Pertinent negatives include no chest pain, no headaches and no shortness of breath. Nothing aggravates the symptoms. Nothing relieves the symptoms. She has tried nothing for the symptoms. The treatment provided no relief.    Past Medical History:  Diagnosis Date  . Acute thoracic aortic dissection [facility]) 2008   emergency surgery - [place]  . Anxiety   . Cancer ([place])    skin  . Diabetes mellitus   . Diverticulosis   . GERD (gastroesophageal reflux disease)   . Hemorrhoids   . Hyperlipidemia   . Hypertension   . Kidney stones   . Sinus node dysfunction ([facility])    hx of PPM    Patient Active Problem List   Diagnosis Date Noted  . Sinus node dysfunction ([place]) 10/11/2015  . Diabetes mellitus type 2 in obese ([place]) 04/24/2015  . Atrial flutter ([place]) 10/07/2013  . Pacemaker generator end of life 09/05/2013  . Chest pain at rest 09/05/2013  . Severe obesity (BMI >= 40) ([place]) 07/30/2013  . Pacemaker - Medtronic Adapta dual chamber Sep 2014, initial implantation 2007 07/30/2013  . GERD 01/15/2011  . DIVERTICULITIS, HX OF 12/09/2010  . POSTMENOPAUSAL STATUS 07/10/2009  . NECK MASS 07/23/2008  . ACUTE BRONCHITIS 11/09/2007  . 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 Hyperlipidemia LDL goal <100 05/23/2007  . Anxiety state 05/23/2007  . Essential hypertension 05/23/2007    Past  Surgical History:  Procedure Laterality Date  . ABDOMINAL HYSTERECTOMY  1984  . APPENDECTOMY    . EYE SURGERY  2014   Rght eye  . PACEMAKER GENERATOR CHANGE  2014   Medtronic adapta  . PACEMAKER GENERATOR CHANGE N/A 08/29/2013   Procedure: PACEMAKER GENERATOR CHANGE;  Surgeon: Sanda Klein, MD;  Location: [facility] CATH LAB;  Service: Cardiovascular;  Laterality: N/A;  . PACEMAKER INSERTION  08/2006   dual chamber Medtronic EnRhythm; r/t sinus node dysfunction   . REPAIR OF ACUTE ASCENDING THORACIC AORTIC DISSECTION  2008   Dr. Servando Snare  . THORACIC AORTIC ANEURYSM REPAIR  2000   type III  . TRANSTHORACIC ECHOCARDIOGRAM  09/20/2012   EF=>55% with mild conc LVH; LA mod dilated; RA mildly dilated; mild MR/TR/AR     OB History   None      Home Medications    Prior to Admission medications   Medication Sig Start Date End Date Taking? Authorizing Provider  ALPRAZolam [name] 0.5 MG tablet TAKE 1 TABLET BY MOUTH 3 TIMES A DAY AS NEEDED 04/28/18   Carollee Herter, Alferd Apa, DO  carvedilol (COREG) 25 MG tablet Take 1 tablet (25 mg total) by mouth 2 (two) times daily with a meal. 06/22/17   Croitoru, Mihai, MD  cholecalciferol (VITAMIN D) 1000 UNITS tablet Take 1,000 Units by mouth daily.    [provider]  citalopram (CELEXA) 40 MG tablet TAKE 1 TABLET BY MOUTH EVERY DAY 04/25/18  Carollee Herter, Yvonne R, DO  fenofibrate 160 MG tablet TAKE 1 TABLET (160 MG TOTAL) BY MOUTH DAILY. 11/06/16   Carollee Herter, Kendrick Fries R, DO  fluticasone (FLONASE) 50 MCG/ACT nasal spray USE 2 SPRAYS IN [facility] NOSTIRL EVERY DAY 07/18/17   Carollee Herter, Kendrick Fries R, DO  furosemide (LASIX) 40 MG tablet TAKE 1 TABLET BY MOUTH EVERY DAY 12/26/17   Carollee Herter, Alferd Apa, DO  hyoscyamine (LEVSIN SL) 0.125 MG SL tablet TAKE 1 TABLET BY MOUTH TWICE A DAY AS NEEDED FOR CRAMPING OR DIARRHEA OR LOOSE STOOLS 07/18/17   Carollee Herter, Alferd Apa, DO  lansoprazole (PREVACID) 15 MG capsule Take 15 mg by mouth as needed (for heart burn).      [provider]  Polyethyl Glycol-Propyl Glycol (SYSTANE OP) Apply 1 drop to eye 4 (four) times daily - after meals and at bedtime.    [provider]  Probiotic Product (PRO-BIOTIC BLEND PO) Take 1 tablet by mouth as needed.     [provider]  rivaroxaban (XARELTO) 20 MG TABS tablet Take 1 tablet (20 mg total) by mouth daily with supper. FOLLOW UP WITH CARDIOLOGIST PRIOR TO NEXT REFILL AUTHORIZATION 04/19/18   Croitoru, Mihai, MD  rosuvastatin (CRESTOR) 20 MG tablet TAKE 1 TABLET BY MOUTH DAILY (REPEAT LABS ARE DUE NOW) 03/10/18   Carollee Herter, Alferd Apa, DO  sitaGLIPtin (JANUVIA) 100 MG tablet Take 1 tablet (100 mg total) by mouth daily. 12/15/17   Philemon Kingdom, MD    Family History Family History  Problem Relation Age of Onset  . Kidney disease Mother   . Heart attack Father   . Heart attack Maternal Grandmother   . Valvular heart disease Son        valve replacement at 70  . Cancer Neg Hx     Social History Social History   Tobacco Use  . Smoking status: Former Smoker    Last attempt to quit: 09/04/1993    Years since quitting: 24.6  . Smokeless tobacco: Never Used  Substance Use Topics  . Alcohol use: No  . Drug use: No     Allergies   Oxycodone; Vicodin [hydrocodone-acetaminophen]; Neomycin-bacitracin zn-polymyx; and Sulfonamide derivatives   Review of Systems Review of Systems  Constitutional: Negative for chills, fatigue and fever.  HENT: Negative for congestion.   Respiratory: Negative for cough, chest tightness, shortness of breath and wheezing.   Cardiovascular: Negative for chest pain.  Gastrointestinal: Positive for abdominal pain and nausea. Negative for abdominal distention, constipation, diarrhea and rectal pain.  Genitourinary: Positive for flank pain. Negative for difficulty urinating, frequency and urgency.       Hesitancy   Musculoskeletal: Negative for back pain, neck pain and neck stiffness.  Skin: Negative for rash and  wound.  Neurological: Negative for light-headedness and headaches.  Psychiatric/Behavioral: Negative for agitation.  All other systems reviewed and are negative.    Physical Exam Updated Vital Signs Ht 5' (1.524 m)   Wt 93.4 kg (206 lb)   SpO2 95%   BMI 40.23 kg/m   Physical Exam  Constitutional: She is oriented to person, place, and time. She appears well-developed and well-nourished. No distress.  HENT:  Head: Normocephalic and atraumatic.  Mouth/Throat: Oropharynx is clear and moist. No oropharyngeal exudate.  Eyes: Pupils are equal, round, and reactive to light.  Neck: Neck supple.  Cardiovascular: Normal rate, regular rhythm and intact distal pulses.  No murmur heard. Pulmonary/Chest: No respiratory distress. She exhibits no tenderness.  Abdominal: Soft. Normal  appearance. She exhibits no distension. There is tenderness. There is no rigidity, no rebound, no guarding and no CVA tenderness.    Musculoskeletal: She exhibits tenderness. She exhibits no edema.  Neurological: She is alert and oriented to person, place, and time. No sensory deficit. She exhibits normal muscle tone.  Skin: Capillary refill takes less than 2 seconds. No rash noted. She is not diaphoretic. No erythema.  Nursing note and vitals reviewed.    ED Treatments / Results  Labs (all labs ordered are listed, but only abnormal results are displayed) Labs Reviewed  URINALYSIS, ROUTINE W REFLEX MICROSCOPIC - Abnormal; Notable for the following components:      Result Value   APPearance HAZY (*)    Leukocytes, UA LARGE (*)    WBC, UA >50 (*)    Bacteria, UA RARE (*)    Non Squamous Epithelial 0-5 (*)    All other components within normal limits  COMPREHENSIVE METABOLIC PANEL - Abnormal; Notable for the following components:   Glucose, Bld 116 (*)    Creatinine, Ser 1.11 (*)    Total Protein 6.2 (*)    Alkaline Phosphatase 32 (*)    GFR calc non Af Amer 49 (*)    GFR calc Af Amer 57 (*)    All other  components within normal limits  CBC WITH DIFFERENTIAL/PLATELET - Abnormal; Notable for the following components:   Platelets 99 (*)    All other components within normal limits  URINE CULTURE    EKG None  Radiology Ct Renal Stone Study  Result Date: 05/05/2018 CLINICAL DATA:  Right-sided flank pain beginning last night. Nausea. EXAM: CT ABDOMEN AND PELVIS WITHOUT CONTRAST TECHNIQUE: Multidetector CT imaging of the abdomen and pelvis was performed following the standard protocol without IV contrast. COMPARISON:  02/09/2018 and 02/09/2013 FINDINGS: Lower chest: Minimal lingular atelectasis. Cardiac pacer leads are present. Hepatobiliary: Normal. Pancreas: Normal. Spleen: Normal. Adrenals/Urinary Tract: 1.6 cm right adrenal mass with Hounsfield unit measurements of 13 unchanged from 2014 likely an adenoma. Left adrenal gland is normal. Kidneys normal in size without hydronephrosis. 3.7 cm cyst over the lower pole right kidney unchanged. Left midpole renal cyst unchanged. Moderate left-sided nephrolithiasis is present. Ureters and bladder are normal. Stomach/Bowel: Stomach and small bowel are normal. Previous appendectomy. Diverticulosis of the colon most prominent over the sigmoid colon. Vascular/Lymphatic: Mild calcified plaque over the abdominal aorta with evidence of known aortic dissection. Aortic caliber within normal and unchanged. No adenopathy. Reproductive: Previous hysterectomy. Left ovary not visualized. Right ovary unchanged. Other: No free fluid or focal inflammatory change. Musculoskeletal: Degenerative change of the spine. IMPRESSION: No acute findings in the abdomen/pelvis. Moderate left-sided nephrolithiasis. No ureteral stones or obstruction. Bilateral renal cysts.  Stable 1.6 cm right adrenal adenoma. Colonic diverticulosis. Electronically Signed   By: Marin Olp M.D.   On: 05/05/2018 21:46    Procedures Procedures (including critical care time)  Medications Ordered in  ED Medications  sodium chloride 0.9 % bolus 1,000 mL (0 mLs Intravenous Stopped 05/05/18 2339)  morphine 4 MG/ML injection 4 mg (4 mg Intravenous Given 05/05/18 2231)     Initial Impression / Assessment and Plan / ED Course  I have reviewed the triage vital signs and the nursing notes.  Pertinent labs & imaging results that were available during my care of the patient were reviewed by me and considered in my medical decision making (see chart for details).     [name] is a 72 y.o. female with a  past medical history significant for prior kidney stones, hypertension, diabetes, pacemaker for sinus node dysfunction, and previous proximal aortic dissection who presents with right flank pain.  Patient reports her pain is "my kidney stone pain".  She reports this does not feel like her aortic dissection and she is not having any midline pain.  She reports she has had some urinary hesitancy and looking darker.  She reports she has not had a UTI in some time.  She reports nausea but no vomiting.  She denies fevers or chills.  She denies chest pain, back pain, leg pain, or leg swelling.  She reports that her pain has been ongoing since last night.  Patient reports taking tramadol at home without much pain relief however her nausea has resolved prior to arrival.  On exam, patient had mild tenderness in the right flank.  No CVA tenderness.,  No other abdominal tenderness.  Lungs clear.  Patient has good pulses in upper and lower extremities.  Given patient's report that this feels like her kidney stone pain, that is the primary concern initially.  Patient had laboratory testing urinalysis and a stone study to evaluate.  Patient felt better after pain medication.  Patient was able to ambulate without difficulty.  Patient's vital signs are reassuring.  CT scan showed stones in the left side but no right-sided stone.  Considering recently passed stone given her similar symptoms however with the urinary  symptoms and the flank pain, pyelonephritis also considered.  Urinalysis shows white blood cells and bacteria, given the hesitancy, will treat for UTI/[place].  Although CT scan was primarily targeted for stones, there was not evidence of aortic aneurysm or change in aorta appearance.  Cannot definitively rule out dissection however given difference in symptoms, doubt dissection.  Conversation was held as to possibility of this however suspect a renal cause of the pain.  Patient agreed to plan for outpatient antibiotics and follow-up with PCP in several days.  Strict return precautions were given and understood.  Patient had no other questions or concerns and was discharged in good condition with improved symptoms.   Final Clinical Impressions(s) / ED Diagnoses   Final diagnoses:  Right flank pain  Lower urinary tract infectious disease  Pyelonephritis    ED Discharge Orders        Ordered    cephALEXin (KEFLEX) 500 MG capsule  3 times daily     05/05/18 2317    traMADol (ULTRAM) 50 MG tablet  Every 6 hours PRN     05/05/18 2317      Clinical Impression: 1. Right flank pain   2. Lower urinary tract infectious disease   3. Pyelonephritis     Disposition: Discharge  Condition: Good  I have discussed the results, Dx and Tx plan with the pt(& family if present). He/she/they expressed understanding and agree(s) with the plan. Discharge instructions discussed at great length. Strict return precautions discussed and pt &/or family have verbalized understanding of the instructions. No further questions at time of discharge.    Discharge Medication List as of 05/05/2018 11:18 PM    START taking these medications   Details  cephALEXin (KEFLEX) 500 MG capsule Take 1 capsule (500 mg total) by mouth 3 (three) times daily for 10 days., Starting Fri 05/05/2018, Until Mon 05/15/2018, Print    traMADol (ULTRAM) 50 MG tablet Take 1 tablet (50 mg total) by mouth every 6 (six) hours as needed.,  Starting Fri 05/05/2018, Print  Follow Up: [name], DO [address] [place] [address] [phone]     [facility] EMERGENCY DEPARTMENT [address] [id number] mc [place]       Tegeler, Gwenyth Allegra, MD 05/06/18 [phone]

## 2018-05-05 NOTE — Discharge Instructions (Signed)
Your work-up today showed evidence of urinary tract infection and given your pain in your flank onto her back with your symptoms, we are concerned you may have a kidney infection.  Please take the antibiotics and stay hydrated.  Please take the pain medicine to help with your symptoms.  Please follow-up with your primary care physician in the next several days for reassessment.  If any symptoms change or worsen, please return to the nearest emergency department.

## 2018-05-07 LAB — URINE CULTURE

## 2018-05-10 ENCOUNTER — Other Ambulatory Visit: Payer: Self-pay | Admitting: Family Medicine

## 2018-05-10 DIAGNOSIS — F418 Other specified anxiety disorders: Secondary | ICD-10-CM

## 2018-05-11 ENCOUNTER — Encounter: Payer: Self-pay | Admitting: Family Medicine

## 2018-05-11 ENCOUNTER — Ambulatory Visit (INDEPENDENT_AMBULATORY_CARE_PROVIDER_SITE_OTHER): Payer: Medicare Other | Admitting: Family Medicine

## 2018-05-11 VITALS — BP 123/60 | HR 83 | Temp 98.0°F | Resp 16 | Wt 215.6 lb

## 2018-05-11 DIAGNOSIS — N12 Tubulo-interstitial nephritis, not specified as acute or chronic: Secondary | ICD-10-CM | POA: Diagnosis not present

## 2018-05-11 DIAGNOSIS — F418 Other specified anxiety disorders: Secondary | ICD-10-CM | POA: Diagnosis not present

## 2018-05-11 MED ORDER — TRAMADOL HCL 50 MG PO TABS: ORAL_TABLET | ORAL | 0 refills | Status: DC

## 2018-05-11 MED ORDER — CITALOPRAM HYDROBROMIDE 40 MG PO TABS: 40.0000 mg | ORAL_TABLET | Freq: Every day | ORAL | 3 refills | Status: DC

## 2018-05-11 NOTE — Patient Instructions (Signed)
Pyelonephritis, Adult Pyelonephritis is a kidney infection. The kidneys are the organs that filter a person's blood and move waste out of the bloodstream and into the urine. Urine passes from the kidneys, through the ureters, and into the bladder. There are two main types of pyelonephritis:  Infections that come on quickly without any warning (acute pyelonephritis).  Infections that last for a long period of time (chronic pyelonephritis).  In most cases, the infection clears up with treatment and does not cause further problems. More severe infections or chronic infections can sometimes spread to the bloodstream or lead to other problems with the kidneys. What are the causes? This condition is usually caused by:  Bacteria traveling from the bladder to the kidney through infected urine. The urine in the bladder can become infected with bacteria from: ? Bladder infection (cystitis). ? Inflammation of the prostate gland (prostatitis). ? Sexual intercourse, in females.  Bacteria traveling from the bloodstream to the kidney.  What increases the risk? This condition is more likely to develop in:  Pregnant women.  Older people.  People who have diabetes.  People who have kidney stones or bladder stones.  People who have other abnormalities of the kidney or ureter.  People who have a catheter placed in the bladder.  People who have cancer.  People who are sexually active.  Women who use spermicides.  People who have had a prior urinary tract infection.  What are the signs or symptoms? Symptoms of this condition include:  Frequent urination.  Strong or persistent urge to urinate.  Burning or stinging when urinating.  Abdominal pain.  Back pain.  Pain in the side or flank area.  Fever.  Chills.  Blood in the urine, or dark urine.  Nausea.  Vomiting.  How is this diagnosed? This condition may be diagnosed based on:  Medical history and physical exam.  Urine  tests.  Blood tests.  You may also have imaging tests of the kidneys, such as an ultrasound or CT scan. How is this treated? Treatment for this condition may depend on the severity of the infection.  If the infection is mild and is found early, you may be treated with antibiotic medicines taken by mouth. You will need to drink fluids to remain hydrated.  If the infection is more severe, you may need to stay in the hospital and receive antibiotics given directly into a vein through an IV tube. You may also need to receive fluids through an IV tube if you are not able to remain hydrated. After your hospital stay, you may need to take oral antibiotics for a period of time.  Other treatments may be required, depending on the cause of the infection. Follow these instructions at home: Medicines  Take over-the-counter and prescription medicines only as told by your health care provider.  If you were prescribed an antibiotic medicine, take it as told by your health care provider. Do not stop taking the antibiotic even if you start to feel better. General instructions  Drink enough fluid to keep your urine clear or pale yellow.  Avoid caffeine, tea, and carbonated beverages. They tend to irritate the bladder.  Urinate often. Avoid holding in urine for long periods of time.  Urinate before and after sex.  After a bowel movement, women should cleanse from front to back. Use each tissue only once.  Keep all follow-up visits as told by your health care provider. This is important. Contact a health care provider if:  Your symptoms   do not get better after 2 days of treatment.  Your symptoms get worse.  You have a fever. Get help right away if:  You are unable to take your antibiotics or fluids.  You have shaking chills.  You vomit.  You have severe flank or back pain.  You have extreme weakness or fainting. This information is not intended to replace advice given to you by your  health care provider. Make sure you discuss any questions you have with your health care provider. Document Released: 11/15/2005 Document Revised: 04/22/2016 Document Reviewed: 03/10/2015 Elsevier Interactive Patient Education  2018 [employer]  

## 2018-05-11 NOTE — Assessment & Plan Note (Signed)
Symptoms have improved Culture repeated Urine dip looks much better Finish abx Call prn

## 2018-05-11 NOTE — Progress Notes (Signed)
Subjective:  I acted as a Education administrator for [employer]. Yancey Flemings, [place]   Patient ID: [name], female    DOB: 1946/08/28, 72 y.o.   MRN: [id number]  Chief Complaint  Patient presents with  . Pyelonephritis    HPI  Patient is in today for hospital follow up on pyelonephritis. Pt was put on keflex and tramadol   She had to take 2 tramadol to help the pain.  She is much better.  Still has some pain but much better than usual. Culture done in ER needs to be repeated.     Patient Care Team: Carollee Herter, Alferd Apa, DO as PCP - General (Family Medicine) Sanda Klein, MD as Consulting Physician (Cardiology) [name], MD as Consulting Physician (Ophthalmology) Grace Isaac, MD as Consulting Physician (Cardiothoracic Surgery)   Past Medical History:  Diagnosis Date  . Acute thoracic aortic dissection [facility]) 2008   emergency surgery - [place]  . Anxiety   . Cancer ([place])    skin  . Diabetes mellitus   . Diverticulosis   . GERD (gastroesophageal reflux disease)   . Hemorrhoids   . Hyperlipidemia   . Hypertension   . Kidney stones   . Sinus node dysfunction ([facility])    hx of PPM    Past Surgical History:  Procedure Laterality Date  . ABDOMINAL HYSTERECTOMY  1984  . APPENDECTOMY    . EYE SURGERY  2014   Rght eye  . PACEMAKER GENERATOR CHANGE  2014   Medtronic adapta  . PACEMAKER GENERATOR CHANGE N/A 08/29/2013   Procedure: PACEMAKER GENERATOR CHANGE;  Surgeon: Sanda Klein, MD;  Location: [facility] CATH [facility];  Service: Cardiovascular;  Laterality: N/A;  . PACEMAKER INSERTION  08/2006   dual chamber Medtronic EnRhythm; r/t sinus node dysfunction   . REPAIR OF ACUTE ASCENDING THORACIC AORTIC DISSECTION  2008   Dr. Servando Snare  . THORACIC AORTIC ANEURYSM REPAIR  2000   type III  . TRANSTHORACIC ECHOCARDIOGRAM  09/20/2012   EF=>55% with mild conc LVH; LA mod dilated; RA mildly dilated; mild MR/TR/AR    Family History  Problem Relation Age of Onset  . Kidney disease  Mother   . Heart attack Father   . Heart attack Maternal Grandmother   . Valvular heart disease Son        valve replacement at 38  . Cancer Neg Hx     Social History   Socioeconomic History  . Marital status: Widowed    Spouse name: Not on file  . Number of children: 2  . Years of education: Not on file  . Highest education level: Not on file  Occupational History    Employer: RETIRED  Social Needs  . Financial resource strain: Not on file  . Food insecurity:    Worry: Not on file    Inability: Not on file  . Transportation needs:    Medical: Not on file    Non-medical: Not on file  Tobacco Use  . Smoking status: Former Smoker    Last attempt to quit: 09/04/1993    Years since quitting: 24.6  . Smokeless tobacco: Never Used  Substance and Sexual Activity  . Alcohol use: No  . Drug use: No  . Sexual activity: Not Currently  Lifestyle  . Physical activity:    Days per week: Not on file    Minutes per session: Not on file  . Stress: Not on file  Relationships  . Social connections:    Talks on phone:  Not on file    Gets together: Not on file    Attends religious service: Not on file    Active member of club or organization: Not on file    Attends meetings of clubs or organizations: Not on file    Relationship status: Not on file  . Intimate partner violence:    Fear of current or ex partner: Not on file    Emotionally abused: Not on file    Physically abused: Not on file    Forced sexual activity: Not on file  Other Topics Concern  . Not on file  Social History Narrative   Lives at home and husband was just moved to [place]    Outpatient Medications Prior to Visit  Medication Sig Dispense Refill  . ALPRAZolam (XANAX) 0.5 MG tablet TAKE 1 TABLET BY MOUTH 3 TIMES A DAY AS NEEDED 90 tablet 0  . carvedilol (COREG) 25 MG tablet Take 1 tablet (25 mg total) by mouth 2 (two) times daily with a meal. 180 tablet 3  . cephALEXin (KEFLEX) 500 MG capsule Take 1  capsule (500 mg total) by mouth 3 (three) times daily for 10 days. 30 capsule 0  . cholecalciferol (VITAMIN D) 1000 UNITS tablet Take 1,000 Units by mouth daily.    . citalopram (CELEXA) 40 MG tablet TAKE 1 TABLET BY MOUTH EVERY DAY 90 tablet 0  . fenofibrate 160 MG tablet TAKE 1 TABLET (160 MG TOTAL) BY MOUTH DAILY. 90 tablet 1  . fluticasone (FLONASE) 50 MCG/ACT nasal spray USE 2 SPRAYS IN EACH NOSTIRL EVERY DAY 16 g 2  . furosemide (LASIX) 40 MG tablet TAKE 1 TABLET BY MOUTH EVERY DAY 90 tablet 1  . hyoscyamine (LEVSIN SL) 0.125 MG SL tablet TAKE 1 TABLET BY MOUTH TWICE A DAY AS NEEDED FOR CRAMPING OR DIARRHEA OR LOOSE STOOLS 60 tablet 0  . lansoprazole (PREVACID) 15 MG capsule Take 15 mg by mouth as needed (for heart burn).     Vladimir Faster Glycol-Propyl Glycol (SYSTANE OP) Apply 1 drop to eye 4 (four) times daily - after meals and at bedtime.    . Probiotic Product (PRO-BIOTIC BLEND PO) Take 1 tablet by mouth as needed.     . rivaroxaban (XARELTO) 20 MG TABS tablet Take 1 tablet (20 mg total) by mouth daily with supper. FOLLOW UP WITH CARDIOLOGIST PRIOR TO NEXT REFILL AUTHORIZATION 60 tablet 0  . rosuvastatin (CRESTOR) 20 MG tablet TAKE 1 TABLET BY MOUTH DAILY (REPEAT LABS ARE DUE NOW) 90 tablet 1  . sitaGLIPtin (JANUVIA) 100 MG tablet Take 1 tablet (100 mg total) by mouth daily. 90 tablet 1  . traMADol (ULTRAM) 50 MG tablet Take 1 tablet (50 mg total) by mouth every 6 (six) hours as needed. 15 tablet 0  . citalopram (CELEXA) 40 MG tablet TAKE 1 TABLET BY MOUTH EVERY DAY 90 tablet 0   No facility-administered medications prior to visit.     Allergies  Allergen Reactions  . Oxycodone Itching  . Vicodin [Hydrocodone-Acetaminophen] Itching  . Neomycin-Bacitracin Zn-Polymyx Other (See Comments)    Red , swollen eye  . Sulfonamide Derivatives Other (See Comments)    Just had reaction when taking during pregnancy- last child birth 49    Review of Systems  Constitutional: Negative for  chills, fever and malaise/fatigue.  HENT: Negative for congestion and hearing loss.   Eyes: Negative for discharge.  Respiratory: Negative for cough, sputum production and shortness of breath.   Cardiovascular: Negative for chest  pain, palpitations and leg swelling.  Gastrointestinal: Negative for abdominal pain, blood in stool, constipation, diarrhea, heartburn, nausea and vomiting.  Genitourinary: Negative for dysuria, frequency, hematuria and urgency.  Musculoskeletal: Negative for back pain, falls and myalgias.  Skin: Negative for rash.  Neurological: Negative for dizziness, sensory change, loss of consciousness, weakness and headaches.  Endo/Heme/Allergies: Negative for environmental allergies. Does not bruise/bleed easily.  Psychiatric/Behavioral: Negative for depression and suicidal ideas. The patient is not nervous/anxious and does not have insomnia.        Objective:    Physical Exam  Constitutional: She is oriented to person, place, and time. She appears well-developed and well-nourished.  HENT:  Head: Normocephalic and atraumatic.  Eyes: Conjunctivae and EOM are normal.  Neck: Normal range of motion. Neck supple. No JVD present. Carotid bruit is not present. No thyromegaly present.  Cardiovascular: Normal rate, regular rhythm and normal heart sounds.  No murmur heard. Pulmonary/Chest: Effort normal and breath sounds normal. No respiratory distress. She has no wheezes. She has no rales. She exhibits no tenderness.  Abdominal: Soft. There is no tenderness. There is no rebound and no guarding.  Musculoskeletal: She exhibits no edema.  Neurological: She is alert and oriented to person, place, and time.  Psychiatric: She has a normal mood and affect.  Nursing note and vitals reviewed.   BP 123/60 (BP Location: Left Arm, Patient Position: Sitting, Cuff Size: Normal)   Pulse 83   Temp 98 F (36.7 C) (Oral)   Resp 16   Wt 215 lb 9.6 oz (97.8 kg)   SpO2 98%   BMI 42.11 kg/m   Wt Readings from Last 3 Encounters:  05/11/18 215 lb 9.6 oz (97.8 kg)  05/05/18 206 lb (93.4 kg)  02/21/18 208 lb 12.8 oz (94.7 kg)   BP Readings from Last 3 Encounters:  05/11/18 123/60  05/05/18 (!) 121/58  02/21/18 134/74     Immunization History  Administered Date(s) Administered  . Influenza Split 09/13/2011  . Influenza Whole 09/19/2008, 09/09/2009, 10/15/2010  . Influenza, High Dose Seasonal PF 10/14/2015  . Influenza,inj,Quad PF,6+ Mos 10/11/2013, 08/23/2014  . Influenza-Unspecified 09/10/2016  . Pneumococcal Conjugate-13 10/14/2015  . Pneumococcal Polysaccharide-23 01/09/2013  . Td 09/01/2006  . Zoster 12/09/2010    Health Maintenance  Topic Date Due  . URINE MICROALBUMIN  01/21/2015  . TETANUS/TDAP  09/01/2016  . OPHTHALMOLOGY EXAM  10/07/2016  . FOOT EXAM  04/22/2017  . MAMMOGRAM  05/12/2019 (Originally 09/14/2013)  . COLONOSCOPY  05/11/2029 (Originally 06/22/2014)  . INFLUENZA VACCINE  06/29/2018  . HEMOGLOBIN A1C  08/24/2018  . DEXA SCAN  Completed  . Hepatitis C Screening  Completed  . PNA vac Low Risk Adult  Completed    Lab Results  Component Value Date   WBC 6.0 05/05/2018   HGB 13.2 05/05/2018   HCT 39.6 05/05/2018   PLT 99 (L) 05/05/2018   GLUCOSE 116 (H) 05/05/2018   CHOL 143 06/28/2017   TRIG 107.0 06/28/2017   HDL 51.40 06/28/2017   LDLDIRECT 217.7 09/13/2011   LDLCALC 70 06/28/2017   ALT 19 05/05/2018   AST 28 05/05/2018   NA 140 05/05/2018   K 4.3 05/05/2018   CL 107 05/05/2018   CREATININE 1.11 (H) 05/05/2018   BUN 15 05/05/2018   CO2 25 05/05/2018   TSH 0.986 08/22/2013   INR 1.27 05/21/2016   HGBA1C 7.0 02/21/2018   MICROALBUR 1.6 01/21/2014    Lab Results  Component Value Date   TSH 0.986 08/22/2013  Lab Results  Component Value Date   WBC 6.0 05/05/2018   HGB 13.2 05/05/2018   HCT 39.6 05/05/2018   MCV 98.0 05/05/2018   PLT 99 (L) 05/05/2018   Lab Results  Component Value Date   NA 140 05/05/2018   K 4.3  05/05/2018   CO2 25 05/05/2018   GLUCOSE 116 (H) 05/05/2018   BUN 15 05/05/2018   CREATININE 1.11 (H) 05/05/2018   BILITOT 0.7 05/05/2018   ALKPHOS 32 (L) 05/05/2018   AST 28 05/05/2018   ALT 19 05/05/2018   PROT 6.2 (L) 05/05/2018   ALBUMIN 3.7 05/05/2018   CALCIUM 8.9 05/05/2018   ANIONGAP 8 05/05/2018   GFR 53.77 (L) 06/28/2017   Lab Results  Component Value Date   CHOL 143 06/28/2017   Lab Results  Component Value Date   HDL 51.40 06/28/2017   Lab Results  Component Value Date   LDLCALC 70 06/28/2017   Lab Results  Component Value Date   TRIG 107.0 06/28/2017   Lab Results  Component Value Date   CHOLHDL 3 06/28/2017   Lab Results  Component Value Date   HGBA1C 7.0 02/21/2018         Assessment & Plan:   Problem List Items Addressed This Visit      Unprioritized   Pyelonephritis - Primary    Symptoms have improved Culture repeated Urine dip looks much better Finish abx Call prn       Relevant Medications   traMADol (ULTRAM) 50 MG tablet   Other Relevant Orders   POCT Urinalysis Dipstick (Automated)   Urine Culture    Other Visit Diagnoses    Depression with anxiety       Relevant Medications   citalopram (CELEXA) 40 MG tablet      I have changed Cleopatra L. Kobel's citalopram. I am also having her start on traMADol. Additionally, I am having her maintain her lansoprazole, cholecalciferol, Probiotic Product (PRO-BIOTIC BLEND PO), Polyethyl Glycol-Propyl Glycol (SYSTANE OP), fenofibrate, carvedilol, fluticasone, hyoscyamine, sitaGLIPtin, furosemide, rosuvastatin, rivaroxaban, citalopram, ALPRAZolam, cephALEXin, and traMADol.  Meds ordered this encounter  Medications  . traMADol (ULTRAM) 50 MG tablet    Sig: 1-2 po q6h prn    Dispense:  30 tablet    Refill:  0  . citalopram (CELEXA) 40 MG tablet    Sig: Take 1 tablet (40 mg total) by mouth daily.    Dispense:  90 tablet    Refill:  3    CMA served as scribe during this visit. History,  Physical and Plan performed by medical provider. Documentation and orders reviewed and attested to.  [name], DO

## 2018-05-12 LAB — URINE CULTURE
MICRO NUMBER:: 90710416
Result:: NO GROWTH
SPECIMEN QUALITY:: ADEQUATE

## 2018-05-13 ENCOUNTER — Other Ambulatory Visit: Payer: Self-pay | Admitting: Family Medicine

## 2018-05-13 DIAGNOSIS — E785 Hyperlipidemia, unspecified: Secondary | ICD-10-CM

## 2018-05-15 ENCOUNTER — Other Ambulatory Visit: Payer: Self-pay | Admitting: Internal Medicine

## 2018-05-15 DIAGNOSIS — IMO0002 Reserved for concepts with insufficient information to code with codable children: Secondary | ICD-10-CM

## 2018-05-15 DIAGNOSIS — E1165 Type 2 diabetes mellitus with hyperglycemia: Principal | ICD-10-CM

## 2018-05-15 DIAGNOSIS — E1151 Type 2 diabetes mellitus with diabetic peripheral angiopathy without gangrene: Secondary | ICD-10-CM

## 2018-05-15 NOTE — Telephone Encounter (Signed)
Pt. has not had lipid profile done since 06/28/17, values WNL. Routed to Dr. Etter Sjogren for review.

## 2018-05-22 ENCOUNTER — Telehealth: Payer: Self-pay | Admitting: Cardiovascular Disease

## 2018-05-22 NOTE — Telephone Encounter (Signed)
Pt calling to say she only has 1 pill left for tomorrow, pls call asap [place] [phone]

## 2018-05-22 NOTE — Telephone Encounter (Signed)
Rx request sent to pharmacy.  

## 2018-05-25 ENCOUNTER — Other Ambulatory Visit: Payer: Self-pay | Admitting: Family Medicine

## 2018-05-25 DIAGNOSIS — F418 Other specified anxiety disorders: Secondary | ICD-10-CM

## 2018-05-26 NOTE — Telephone Encounter (Signed)
Last alprazolam RX: 04/28/18, #90 Last OV: 05/11/18 Next OV: none scheduled UDS: 10/27/17, low risk due 10/27/18 CSC: 2014, past due CSR: No discrepancies identified

## 2018-05-26 NOTE — Telephone Encounter (Signed)
Pt called stating she is going out of town [place] and is hoping to get alprazolam filled before leaving.

## 2018-05-28 ENCOUNTER — Other Ambulatory Visit: Payer: Self-pay | Admitting: Family Medicine

## 2018-05-28 DIAGNOSIS — I10 Essential (primary) hypertension: Secondary | ICD-10-CM

## 2018-06-02 DIAGNOSIS — R109 Unspecified abdominal pain: Secondary | ICD-10-CM | POA: Diagnosis not present

## 2018-06-02 DIAGNOSIS — Z87891 Personal history of nicotine dependence: Secondary | ICD-10-CM | POA: Diagnosis not present

## 2018-06-02 DIAGNOSIS — R0682 Tachypnea, not elsewhere classified: Secondary | ICD-10-CM | POA: Diagnosis not present

## 2018-06-02 DIAGNOSIS — Z950 Presence of cardiac pacemaker: Secondary | ICD-10-CM | POA: Diagnosis not present

## 2018-06-02 DIAGNOSIS — N200 Calculus of kidney: Secondary | ICD-10-CM | POA: Diagnosis not present

## 2018-06-05 ENCOUNTER — Ambulatory Visit: Payer: Self-pay

## 2018-06-05 NOTE — Telephone Encounter (Signed)
Please call pt tomorrow --- 8/9 ---  Make sure she is feeling better  Offer appointment if not

## 2018-06-05 NOTE — Telephone Encounter (Signed)
[name]. Called to discuss her recent episode of severe pain with kidney stone.  Reported she was at the beach last week, and was evaluated at [facility], for the condition on Tuesday.  Reported that due to being on Xarelto, and the level of the kidney stone, she was not a candidate for Lithotripsy.  Stated she consumed about 50+ bottles of water from Friday to Sunday to try to flush the stone.  Reported "I had pain like no other."  Stated she is sure she passed the stone on Sunday, approx. 3:00 AM.  Stated the toilet water was pink, and had small flecks of dark blood at that time.  Stated the pain was relieved.  Reported she was given several doses of narcotics during the episode, and just doesn't feel good; c/o feeling "washed out and tired."  Also, reported she has swelling in bilat feet, to about 3-4 " above ankle; equal bilaterally.  Also reported puffiness in cheeks, just underneath her eyes, and her hands. Has been much less mobile since the flare up with the kidney stone.  Denied chest pain or shortness of breath.  Stated she has not taken her daily Lasix dose in about one week.  Reported she doesn't always take it daily; "only when I feel like I need it.  Denied any pain in kidney area.  Reported urine is clear.  Denied leg pain, redness, or warmth.  Reported BP 112/68 this morning.  Recommended to resume her daily Lasix dose, to help with the swelling. Offered an appt. with Dr. Carollee Herter for further evaluation. Declined to sched. An appt. At this time.  Agreed to resume Lasix as ordered and will call if her swelling or her feeling of fatigue does not improve.  Advised will send this note to Dr. Carollee Herter for review.  Agrees with plan.      Reason for Disposition . Swelling of face, arm or hands  (Exception: slight puffiness of fingers occurring during hot weather)    Reported has had puffiness in her hands and in cheeks, just below eyes.  Also, reported some swelling of bilat feet, up to  about 3-4" above ankle.  Reported drinking large volume of water over past 48-72 hrs. to try and flush out kidney stone. . [1] MODERATE leg swelling (e.g., swelling extends up to knees) AND [2] new onset or worsening  Answer Assessment - Initial Assessment Questions 1. ONSET: "When did the swelling start?" (e.g., minutes, hours, days)     Ongoing swelling in feet and legs; about one week in hands and face 2. LOCATION: "What part of the leg is swollen?"  "Are both legs swollen or just one leg?"     Feet up to 3-4 " above ankles, fingers, puffiness under eyes  3. SEVERITY: "How bad is the swelling?" (e.g., localized; mild, moderate, severe)  - Localized - small area of swelling localized to one leg  - MILD pedal edema - swelling limited to foot and ankle, pitting edema < 1/4 inch (6 mm) deep, rest and elevation eliminate most or all swelling  - MODERATE edema - swelling of lower leg to knee, pitting edema > 1/4 inch (6 mm) deep, rest and elevation only partially reduce swelling  - SEVERE edema - swelling extends above knee, facial or hand swelling present      moderate 4. REDNESS: "Does the swelling look red or infected?"     Denied 5. PAIN: "Is the swelling painful to touch?" If so, ask: "How  painful is it?"   (Scale 1-10; mild, moderate or severe)     Denied  6. FEVER: "Do you have a fever?" If so, ask: "What is it, how was it measured, and when did it start?"      Denied  7. CAUSE: "What do you think is causing the leg swelling?"     Has hx of leg edema ; has been very immobile with recent kidney stone ; drinking large quantities of water to flush the kidney stone  8. MEDICAL HISTORY: "Do you have a history of heart failure, kidney disease, liver failure, or cancer?"     Hx of heart murmur,  aortic dissection, aortic stenosis, and pacemaker 9. RECURRENT SYMPTOM: "Have you had leg swelling before?" If so, ask: "When was the last time?" "What happened that time?"     Yes; has had this in the  past 10. OTHER SYMPTOMS: "Do you have any other symptoms?" (e.g., chest pain, difficulty breathing)       C/o just not feeling well.  Denied shortness of breath or chest pain.  11. PREGNANCY: "Is there any chance you are pregnant?" "When was your last menstrual period?"       n/a  Protocols used: LEG SWELLING AND EDEMA-A-AH

## 2018-06-06 ENCOUNTER — Telehealth: Payer: Self-pay

## 2018-06-06 NOTE — Telephone Encounter (Signed)
Great!   [name], DO

## 2018-06-06 NOTE — Telephone Encounter (Signed)
Author phoned to f/u on reported kidney stone and edema. No answer; author left detailed message to call back if pt still feels unwell, or swelling has not improved, and stated that Dr. Etter Sjogren was willing to see her today if needed. Call back number given [phone].

## 2018-06-06 NOTE — Telephone Encounter (Signed)
Pt called to thank Dr. Etter Sjogren and her team for helping her feel better, saying the lasix has been helping, and she is currently taking it everyday. "I feel like skipping down the street". Notified Dr. Etter Sjogren per pt. request.

## 2018-06-21 ENCOUNTER — Other Ambulatory Visit: Payer: Self-pay | Admitting: Family Medicine

## 2018-06-21 ENCOUNTER — Ambulatory Visit (INDEPENDENT_AMBULATORY_CARE_PROVIDER_SITE_OTHER): Payer: Medicare Other | Admitting: *Deleted

## 2018-06-21 ENCOUNTER — Telehealth: Payer: Self-pay | Admitting: Cardiology

## 2018-06-21 ENCOUNTER — Encounter: Payer: Medicare Other | Admitting: Cardiovascular Disease

## 2018-06-21 DIAGNOSIS — I350 Nonrheumatic aortic (valve) stenosis: Secondary | ICD-10-CM | POA: Insufficient documentation

## 2018-06-21 DIAGNOSIS — F418 Other specified anxiety disorders: Secondary | ICD-10-CM

## 2018-06-21 DIAGNOSIS — I495 Sick sinus syndrome: Secondary | ICD-10-CM | POA: Diagnosis not present

## 2018-06-21 NOTE — Progress Notes (Signed)
Cardiology Office Note    Date:  06/22/2018   ID:  [name], DOB November 04, 1946, MRN [id number]  PCP:  Carollee Herter, Alferd Apa, DO  Cardiologist:   Sanda Klein, MD   Chief Complaint  Patient presents with  . Annual follow-up visit    Aortic stenosis, history of aortic dissection, pacemaker    History of Present Illness:  [name] is a 72 y.o. female with a history of repaired type A aortic dissection (resuspended native aortic valve), sinus node dysfunction with sinus node arrest, "atrially-pacemaker dependent" status post pacemaker implantation, morbid obesity, diabetes mellitus, hypertension.   Roughly two years has passed since her husband, [name], died.  The patient specifically denies any chest pain at rest exertion, dyspnea at rest or with exertion, orthopnea, paroxysmal nocturnal dyspnea, syncope, palpitations, focal neurological deficits, intermittent claudication, lower extremity edema, unexplained weight gain, cough, hemoptysis or wheezing. She has always been relatively sedentary and this has not changed much.  Her BMI remains in the morbidly obese range of 41.  Her last echocardiogram shows slight progression of aortic valve stenosis.  - Left ventricle: Difficult acoustic windows LVEF appears normal at   approximately 55 to 60% The cavity size was normal. Wall   thickness was increased in a pattern of moderate LVH. Doppler   parameters are consistent with abnormal left ventricular   relaxation (grade 1 diastolic dysfunction). - Aortic valve: AV leaflets are difficult to see They are   thickened, calcified. Peak and mean gradients through the valve   are 40 and 24 mm Hg respectively consistent with moderate AS.   There was mild regurgitation. - Aorta: Aortic root is 74mm  Pacemaker interrogation shows normal device function.  Although she has intact AV conduction she has no escape rhythm when atrial pacing is taken down to 30 bpm.  She is "atrially  dependent".  With the current standard settings, heart rate histograms are quite favorable. settings. She has left bundle branch block, but intact AV conduction and only 30% ventricular pacing. Since her last appointment there have been no new episodes of atrial mode switch or ventricular tachycardia. Atrial flutter was detected in 2015.  Last appointment with Dr. Servando Snare on March 14, with unchanged findings on the CT angiogram of her thoracic aorta: stable surgical repair of the ascending thoracic aorta and an unchanged type A dissection extending through the arch and descending thoracic abdominal aorta. Arch 3.2 cm, descending thoracic 3.4 cm. She saw Dr. Etter Sjogren on June 13, after an ED visit for pyelonephritis a week earlier.  On July 5 she was at the beach in [place] which developed severe left flank pain was diagnosed with nephrolithiasis.  She managed to pass the stone after couple of days and only had relatively mild hematuria.   Past Medical History:  Diagnosis Date  . Acute thoracic aortic dissection [facility]) 2008   emergency surgery - [place]  . Anxiety   . Cancer ([place])    skin  . Diabetes mellitus   . Diverticulosis   . GERD (gastroesophageal reflux disease)   . Hemorrhoids   . Hyperlipidemia   . Hypertension   . Kidney stones   . Sinus node dysfunction (HCC)    hx of PPM    Past Surgical History:  Procedure Laterality Date  . ABDOMINAL HYSTERECTOMY  1984  . APPENDECTOMY    . EYE SURGERY  2014   Rght eye  . PACEMAKER GENERATOR CHANGE  2014   Medtronic adapta  .  PACEMAKER GENERATOR CHANGE N/A 08/29/2013   Procedure: PACEMAKER GENERATOR CHANGE;  Surgeon: Sanda Klein, MD;  Location: [facility];  Service: Cardiovascular;  Laterality: N/A;  . PACEMAKER INSERTION  08/2006   dual chamber Medtronic EnRhythm; r/t sinus node dysfunction   . REPAIR OF ACUTE ASCENDING THORACIC AORTIC DISSECTION  2008   Dr. Servando Snare  . THORACIC AORTIC ANEURYSM REPAIR  2000   type III  .  TRANSTHORACIC ECHOCARDIOGRAM  09/20/2012   EF=>55% with mild conc LVH; LA mod dilated; RA mildly dilated; mild MR/TR/AR    Current Medications: Outpatient Medications Prior to Visit  Medication Sig Dispense Refill  . ALPRAZolam (XANAX) 0.5 MG tablet TAKE 1 TABLET BY MOUTH THREE TIMES A DAY AS NEEDED 90 tablet 0  . carvedilol (COREG) 25 MG tablet Take 1 tablet (25 mg total) by mouth 2 (two) times daily with a meal. 180 tablet 3  . cholecalciferol (VITAMIN D) 1000 UNITS tablet Take 1,000 Units by mouth daily.    . citalopram (CELEXA) 40 MG tablet TAKE 1 TABLET BY MOUTH EVERY DAY 90 tablet 0  . citalopram (CELEXA) 40 MG tablet Take 1 tablet (40 mg total) by mouth daily. 90 tablet 3  . fenofibrate 160 MG tablet TAKE 1 TABLET (160 MG TOTAL) BY MOUTH DAILY. 90 tablet 1  . fluticasone (FLONASE) 50 MCG/ACT nasal spray USE 2 SPRAYS IN EACH NOSTIRL EVERY DAY 16 g 2  . furosemide (LASIX) 40 MG tablet TAKE 1 TABLET BY MOUTH EVERY DAY 90 tablet 1  . hyoscyamine (LEVSIN SL) 0.125 MG SL tablet TAKE 1 TABLET BY MOUTH TWICE A DAY AS NEEDED FOR CRAMPING OR DIARRHEA OR LOOSE STOOLS 60 tablet 0  . JANUVIA 100 MG tablet TAKE 1 TABLET BY MOUTH EVERY DAY 90 tablet 0  . lansoprazole (PREVACID) 15 MG capsule Take 15 mg by mouth as needed (for heart burn).     Vladimir Faster Glycol-Propyl Glycol (SYSTANE OP) Apply 1 drop to eye 4 (four) times daily - after meals and at bedtime.    . Probiotic Product (PRO-BIOTIC BLEND PO) Take 1 tablet by mouth as needed.     . rosuvastatin (CRESTOR) 20 MG tablet TAKE 1 TABLET BY MOUTH DAILY (REPEAT LABS ARE DUE NOW) 90 tablet 1  . traMADol (ULTRAM) 50 MG tablet Take 1 tablet (50 mg total) by mouth every 6 (six) hours as needed. 15 tablet 0  . XARELTO 20 MG TABS tablet TAKE 1 TABLET BY MOUTH EVERY DAY WITH SUPPER 60 tablet 0  . fenofibrate 160 MG tablet TAKE 1 TABLET BY MOUTH EVERY DAY 90 tablet 1  . traMADol (ULTRAM) 50 MG tablet 1-2 po q6h prn 30 tablet 0   No facility-administered  medications prior to visit.      Allergies:   Oxycodone; Vicodin [hydrocodone-acetaminophen]; Neomycin-bacitracin zn-polymyx; and Sulfonamide derivatives   Social History   Socioeconomic History  . Marital status: Widowed    Spouse name: Not on file  . Number of children: 2  . Years of education: Not on file  . Highest education level: Not on file  Occupational History    Employer: RETIRED  Social Needs  . Financial resource strain: Not on file  . Food insecurity:    Worry: Not on file    Inability: Not on file  . Transportation needs:    Medical: Not on file    Non-medical: Not on file  Tobacco Use  . Smoking status: Former Smoker    Last attempt to quit: 09/04/1993  Years since quitting: 24.8  . Smokeless tobacco: Never Used  Substance and Sexual Activity  . Alcohol use: No  . Drug use: No  . Sexual activity: Not Currently  Lifestyle  . Physical activity:    Days per week: Not on file    Minutes per session: Not on file  . Stress: Not on file  Relationships  . Social connections:    Talks on phone: Not on file    Gets together: Not on file    Attends religious service: Not on file    Active member of club or organization: Not on file    Attends meetings of clubs or organizations: Not on file    Relationship status: Not on file  Other Topics Concern  . Not on file  Social History Narrative   Lives at home and husband was just moved to [place]     Family History:  The patient's family history includes Heart attack in her father and maternal grandmother; Kidney disease in her mother; Valvular heart disease in her son.   ROS:   Please see the history of present illness.    ROS All other systems reviewed and are negative.   PHYSICAL EXAM:   VS:  BP 132/74   Pulse 97   Ht 5' (1.524 m)   Wt 210 lb 12.8 oz (95.6 kg)   BMI 41.17 kg/m     General: Alert, oriented x3, no distress, morbid obesity Head: no evidence of trauma, PERRL, EOMI, no exophtalmos  or lid lag, no myxedema, no xanthelasma; normal ears, nose and oropharynx Neck: normal jugular venous pulsations and no hepatojugular reflux; brisk carotid pulses without delay and no carotid bruits Chest: clear to auscultation, no signs of consolidation by percussion or palpation, normal fremitus, symmetrical and full respiratory excursions Cardiovascular: normal position and quality of the apical impulse, regular rhythm, normal first and paradoxically split second heart sounds, no murmurs, rubs or gallops Abdomen: no tenderness or distention, no masses by palpation, no abnormal pulsatility or arterial bruits, normal bowel sounds, no hepatosplenomegaly Extremities: no clubbing, cyanosis or edema; 2+ radial, ulnar and brachial pulses bilaterally; 2+ right femoral, posterior tibial and dorsalis pedis pulses; 2+ left femoral, posterior tibial and dorsalis pedis pulses; no subclavian or femoral bruits Neurological: grossly nonfocal Psych: Normal mood and affect   Wt Readings from Last 3 Encounters:  06/22/18 210 lb 12.8 oz (95.6 kg)  05/11/18 215 lb 9.6 oz (97.8 kg)  05/05/18 206 lb (93.4 kg)      Studies/Labs Reviewed:   EKG:  EKG is  ordered today.   She has a 100% AV sequential pacing on today's tracing  Recent Labs: 05/05/2018: ALT 19; BUN 15; Creatinine, Ser 1.11; Hemoglobin 13.2; Platelets 99; Potassium 4.3; Sodium 140   Lipid Panel    Component Value Date/Time   CHOL 143 06/28/2017 1446   TRIG 107.0 06/28/2017 1446   HDL 51.40 06/28/2017 1446   CHOLHDL 3 06/28/2017 1446   VLDL 21.4 06/28/2017 1446   LDLCALC 70 06/28/2017 1446   LDLDIRECT 217.7 09/13/2011 1535     ASSESSMENT:    1. Aortic valve stenosis, nonrheumatic   2. Sinus node dysfunction (HCC)   3. Pacemaker - Medtronic Adapta dual chamber Sep 2014, initial implantation 2007   4. Atrial flutter, unspecified type ([place])   5. 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, status  post graft repair the ascending aorta   6. Essential hypertension  7. Hyperlipidemia LDL goal <100   8. Diabetes mellitus type 2 in obese ([place])   9. Severe obesity (BMI >= 40) (HCC)      PLAN:  In order of problems listed above:  1. AS: There is evidence of some deterioration in her aortic valve parameters and she has at least moderate aortic stenosis.  She appears to be asymptomatic.  We will increase the frequency of her echocardiogram to yearly.  We talked about the option for TAVR should it become necessary.  She may not be a great candidate for conventional femoral approach due to the residual aortic dissection but could be done via subclavian or transapical approach.   2. SSS: Sinus node arrest, "Atrially dependent" and with evidence of some AV node conduction abnormalities and intraventricular conduction abnormalities, but only 30% ventricular pacing with current settings. 3. PM: Normal device function.  Continue beta-blockers even though these may increase the incidence of ventricular pacing, they are important for her aortic dissection 4. AFlutter: No episode in the last 3 or 4 years.  Nevertheless she has not really had any side effects from anticoagulation and I would recommend continuing this. 5. Hx Ao dissection: Follow-up CT shows stable findings.  Has yearly follow-up with Dr. Servando Snare 6. HTN: Good control.  7. HLP: LDL at target (less than 100 due to diabetes mellitus, no evidence of coronary or peripheral vascular disease). According to Dr. Durwin Nora Little's report, her coronaries were "spotless" at angiography in 2008. No sign of laparoscopic calcifications seen on serial CTs of the chest and abdomen 8. DM: She reports excellent control 9. Obesity: Even though her weight is unchanged and she is frustrated, I encouraged her to continue exercising and healthy diet.    Medication Adjustments/Labs and Tests Ordered: Current medicines are reviewed at length with the patient today.   Concerns regarding medicines are outlined above.  Medication changes, Labs and Tests ordered today are listed in the Patient Instructions below. Patient Instructions  Medication Instructions:  Your physician recommends that you continue on your current medications as directed. Please refer to the Current Medication list given to you today.   Labwork: none  Testing/Procedures: Your physician has requested that you have an echocardiogram. Echocardiography is a painless test that uses sound waves to create images of your heart. It provides your doctor with information about the size and shape of your heart and how well your heart's chambers and valves are working. This procedure takes approximately one hour. There are no restrictions for this procedure. SCHEDULE FOR JAN 2020    Follow-Up: Remote monitoring is used to monitor your Pacemaker or ICD from home. This monitoring reduces the number of office visits required to check your device to one time per year. It allows [name] to keep an eye on the functioning of your device to ensure it is working properly. You are scheduled for a device check from home on 3 months. You may send your transmission at any time that day. If you have a wireless device, the transmission will be sent automatically. After your physician reviews your transmission, you will receive a postcard with your next transmission date.  Your physician wants you to follow-up in: 12 months with Dr. Sallyanne Kuster. You will receive a reminder letter in the mail two months in advance. If you don't receive a letter, please call our office to schedule the follow-up appointment.   Any Other Special Instructions Will Be Listed Below (If Applicable).     If you need a  refill on your cardiac medications before your next appointment, please call your pharmacy.      Signed, Sanda Klein, MD  06/22/2018 1:51 PM    [facility] [place], [place], [place]   [address] Phone: [phone]; Fax: [phone]

## 2018-06-21 NOTE — Telephone Encounter (Signed)
LMOVM reminding pt to send remote transmission.   

## 2018-06-22 ENCOUNTER — Encounter: Payer: Self-pay | Admitting: Cardiology

## 2018-06-22 ENCOUNTER — Ambulatory Visit (INDEPENDENT_AMBULATORY_CARE_PROVIDER_SITE_OTHER): Payer: Medicare Other | Admitting: Cardiovascular Disease

## 2018-06-22 ENCOUNTER — Encounter: Payer: Self-pay | Admitting: Cardiovascular Disease

## 2018-06-22 ENCOUNTER — Encounter: Payer: Medicare Other | Admitting: Cardiovascular Disease

## 2018-06-22 VITALS — BP 132/74 | HR 97 | Ht 60.0 in | Wt 210.8 lb

## 2018-06-22 DIAGNOSIS — I350 Nonrheumatic aortic (valve) stenosis: Secondary | ICD-10-CM | POA: Diagnosis not present

## 2018-06-22 DIAGNOSIS — E669 Obesity, unspecified: Secondary | ICD-10-CM | POA: Diagnosis not present

## 2018-06-22 DIAGNOSIS — I10 Essential (primary) hypertension: Secondary | ICD-10-CM | POA: Diagnosis not present

## 2018-06-22 DIAGNOSIS — Z950 Presence of cardiac pacemaker: Secondary | ICD-10-CM | POA: Diagnosis not present

## 2018-06-22 DIAGNOSIS — I495 Sick sinus syndrome: Secondary | ICD-10-CM

## 2018-06-22 DIAGNOSIS — I7101 Dissection of thoracic aorta: Secondary | ICD-10-CM | POA: Diagnosis not present

## 2018-06-22 DIAGNOSIS — I4892 Unspecified atrial flutter: Secondary | ICD-10-CM

## 2018-06-22 DIAGNOSIS — E1169 Type 2 diabetes mellitus with other specified complication: Secondary | ICD-10-CM

## 2018-06-22 DIAGNOSIS — I71019 Dissection of thoracic aorta, unspecified: Secondary | ICD-10-CM

## 2018-06-22 DIAGNOSIS — E785 Hyperlipidemia, unspecified: Secondary | ICD-10-CM | POA: Diagnosis not present

## 2018-06-22 NOTE — Telephone Encounter (Signed)
Pt is requesting refill on alprazolam.   Last OV: 05/11/2018  Last Fill: 05/26/2018 #90 and 0RF  (Pt sig: 1 tab tid prn) UDS: 10/27/2017 Low risk CSC: Don''t see recent one

## 2018-06-22 NOTE — Progress Notes (Signed)
Remote pacemaker transmission.   

## 2018-06-22 NOTE — Patient Instructions (Signed)
Medication Instructions:  Your physician recommends that you continue on your current medications as directed. Please refer to the Current Medication list given to you today.   Labwork: none  Testing/Procedures: Your physician has requested that you have an echocardiogram. Echocardiography is a painless test that uses sound waves to create images of your heart. It provides your doctor with information about the size and shape of your heart and how well your heart's chambers and valves are working. This procedure takes approximately one hour. There are no restrictions for this procedure. SCHEDULE FOR JAN 2020    Follow-Up: Remote monitoring is used to monitor your Pacemaker or ICD from home. This monitoring reduces the number of office visits required to check your device to one time per year. It allows [name] to keep an eye on the functioning of your device to ensure it is working properly. You are scheduled for a device check from home on 3 months. You may send your transmission at any time that day. If you have a wireless device, the transmission will be sent automatically. After your physician reviews your transmission, you will receive a postcard with your next transmission date.  Your physician wants you to follow-up in: 12 months with Dr. Sallyanne Kuster. You will receive a reminder letter in the mail two months in advance. If you don't receive a letter, please call our office to schedule the follow-up appointment.   Any Other Special Instructions Will Be Listed Below (If Applicable).     If you need a refill on your cardiac medications before your next appointment, please call your pharmacy.

## 2018-06-23 ENCOUNTER — Other Ambulatory Visit: Payer: Self-pay | Admitting: Family Medicine

## 2018-06-23 NOTE — Addendum Note (Signed)
Addended by: Therisa Doyne on: 06/23/2018 04:52 PM   Modules accepted: Orders

## 2018-06-28 ENCOUNTER — Ambulatory Visit (INDEPENDENT_AMBULATORY_CARE_PROVIDER_SITE_OTHER): Payer: Medicare Other | Admitting: Internal Medicine

## 2018-06-28 ENCOUNTER — Encounter: Payer: Self-pay | Admitting: Internal Medicine

## 2018-06-28 VITALS — BP 114/64 | HR 82 | Ht 60.0 in | Wt 210.8 lb

## 2018-06-28 DIAGNOSIS — E7800 Pure hypercholesterolemia, unspecified: Secondary | ICD-10-CM | POA: Diagnosis not present

## 2018-06-28 DIAGNOSIS — E1151 Type 2 diabetes mellitus with diabetic peripheral angiopathy without gangrene: Secondary | ICD-10-CM

## 2018-06-28 DIAGNOSIS — E1165 Type 2 diabetes mellitus with hyperglycemia: Secondary | ICD-10-CM | POA: Diagnosis not present

## 2018-06-28 DIAGNOSIS — IMO0002 Reserved for concepts with insufficient information to code with codable children: Secondary | ICD-10-CM

## 2018-06-28 LAB — POCT GLYCOSYLATED HEMOGLOBIN (HGB A1C): Hemoglobin A1C: 6.8 % — AB (ref 4.0–5.6)

## 2018-06-28 NOTE — Addendum Note (Signed)
Addended by: Drucilla Schmidt on: 06/28/2018 12:20 PM   Modules accepted: Orders

## 2018-06-28 NOTE — Progress Notes (Signed)
Patient ID: [name], female   DOB: [birth year], 72 y.o.   MRN: [id number]  HPI: [name] is a 72 y.o.-year-old female, returning for f/u for DM2, dx 08/2006, non-insulin-dependent, uncontrolled, with complications (h/o Ao dissection). Last visit 4 months ago.  She had a UTI in 04/2018. She then had a 5 mm kidney stone in 05/2018 -she eliminated this by herself.   Last hemoglobin A1c was: Lab Results  Component Value Date   HGBA1C 7.0 02/21/2018   HGBA1C 6.4 01/24/2017   HGBA1C 8.1 (H) 04/22/2016   Pt is on a regimen of: - Januvia 100 mg daily before breakfast >> at waking up - Glipizide XL 5 mg in am >> 2.5 mg daily before breakfast >> pm >> bedtime >> at waking up Metformin ER  1000 mg/day added 07/2014 >> diarrhea She tried Metformin at Dx >> diarrhea.  Pt checks her sugars 2x a day: - am: 129-154 >> 140-182, 203 at 10 AM >>131-163, 170 - 2h after b'fast: n/c >> 165 - before lunch: n/c >> 128-161 >> n/c >> 132 - 2h after lunch: n/c >> 170 >> n/c >> 97-158 - before dinner: 72-120, 130 >> 60-135 >> 110-129, 131 >> 91-140 - 2h after dinner: n/c >> 130-134 >> n/c >> 110, 155 - bedtime: n/c >> 121 - nighttime: n/c Lowest sugar was 60 >> 98 >> 91;  she has hypoglycemia awareness in the 80s. Highest sugar was 195 >> 201 >> 170.  - no CKD, last BUN/creatinine was slightly high:  Lab Results  Component Value Date   BUN 15 05/05/2018   CREATININE 1.11 (H) 05/05/2018  She is not on ACE inhibitor/ARB. -+ HL; last set of lipids: Lab Results  Component Value Date   CHOL 143 06/28/2017   HDL 51.40 06/28/2017   LDLCALC 70 06/28/2017   LDLDIRECT 217.7 09/13/2011   TRIG 107.0 06/28/2017   CHOLHDL 3 06/28/2017  On Crestor and fenofibrate. - last eye exam was in 2018: No DR -No numbness and tingling in her feet.  She has a history of 2x thoracic Ao aneurysms.  She has a pacemaker. Sees Dr. Sallyanne Kuster. S she also has aortic stenosis.  ROS: Constitutional: no weight  gain/no weight loss, no fatigue, + hot flashes , no subjective hypothermia Eyes: no blurry vision, no xerophthalmia ENT: no sore throat, no nodules palpated in throat, no dysphagia, no odynophagia, no hoarseness Cardiovascular: no CP/no SOB/no palpitations/no leg swelling Respiratory: no cough/no SOB/no wheezing Gastrointestinal: no N/no V/no D/no C/no acid reflux Musculoskeletal: no muscle aches/no joint aches Skin: no rashes, no hair loss Neurological: no tremors/no numbness/no tingling/no dizziness  I reviewed pt's medications, allergies, PMH, social hx, family hx, and changes were documented in the history of present illness. Otherwise, unchanged from my initial visit note.  Past Medical History:  Diagnosis Date  . Acute thoracic aortic dissection [facility]) 2008   emergency surgery - [place]  . Anxiety   . Cancer ([place])    skin  . Diabetes mellitus   . Diverticulosis   . GERD (gastroesophageal reflux disease)   . Hemorrhoids   . Hyperlipidemia   . Hypertension   . Kidney stones   . Sinus node dysfunction ([facility])    hx of PPM   Past Surgical History:  Procedure Laterality Date  . ABDOMINAL HYSTERECTOMY  1984  . APPENDECTOMY    . EYE SURGERY  2014   Rght eye  . PACEMAKER GENERATOR CHANGE  2014   Medtronic adapta  .  PACEMAKER GENERATOR CHANGE N/A 08/29/2013   Procedure: PACEMAKER GENERATOR CHANGE;  Surgeon: Sanda Klein, MD;  Location: [facility] CATH [facility];  Service: Cardiovascular;  Laterality: N/A;  . PACEMAKER INSERTION  08/2006   dual chamber Medtronic EnRhythm; r/t sinus node dysfunction   . REPAIR OF ACUTE ASCENDING THORACIC AORTIC DISSECTION  2008   Dr. Servando Snare  . THORACIC AORTIC ANEURYSM REPAIR  2000   type III  . TRANSTHORACIC ECHOCARDIOGRAM  09/20/2012   EF=>55% with mild conc LVH; LA mod dilated; RA mildly dilated; mild MR/TR/AR   Social History   Socioeconomic History  . Marital status: Widowed    Spouse name: Not on file  . Number of children: 2  . Years of  education: Not on file  . Highest education level: Not on file  Occupational History    Employer: RETIRED  Social Needs  . Financial resource strain: Not on file  . Food insecurity:    Worry: Not on file    Inability: Not on file  . Transportation needs:    Medical: Not on file    Non-medical: Not on file  Tobacco Use  . Smoking status: Former Smoker    Last attempt to quit: 09/04/1993    Years since quitting: 24.8  . Smokeless tobacco: Never Used  Substance and Sexual Activity  . Alcohol use: No  . Drug use: No  . Sexual activity: Not Currently  Lifestyle  . Physical activity:    Days per week: Not on file    Minutes per session: Not on file  . Stress: Not on file  Relationships  . Social connections:    Talks on phone: Not on file    Gets together: Not on file    Attends religious service: Not on file    Active member of club or organization: Not on file    Attends meetings of clubs or organizations: Not on file    Relationship status: Not on file  . Intimate partner violence:    Fear of current or ex partner: Not on file    Emotionally abused: Not on file    Physically abused: Not on file    Forced sexual activity: Not on file  Other Topics Concern  . Not on file  Social History Narrative   Lives at home and husband was just moved to [place]   Current Outpatient Medications on File Prior to Visit  Medication Sig Dispense Refill  . ALPRAZolam (XANAX) 0.5 MG tablet TAKE 1 TABLET BY MOUTH THREE TIMES A DAY AS NEEDED 90 tablet 0  . carvedilol (COREG) 25 MG tablet Take 1 tablet (25 mg total) by mouth 2 (two) times daily with a meal. 180 tablet 3  . cholecalciferol (VITAMIN D) 1000 UNITS tablet Take 1,000 Units by mouth daily.    . citalopram (CELEXA) 40 MG tablet TAKE 1 TABLET BY MOUTH EVERY DAY 90 tablet 0  . fenofibrate 160 MG tablet TAKE 1 TABLET (160 MG TOTAL) BY MOUTH DAILY. 90 tablet 1  . fluticasone (FLONASE) 50 MCG/ACT nasal spray USE 2 SPRAYS IN EACH  NOSTIRL EVERY DAY 16 g 1  . furosemide (LASIX) 40 MG tablet TAKE 1 TABLET BY MOUTH EVERY DAY 90 tablet 1  . hyoscyamine (LEVSIN SL) 0.125 MG SL tablet TAKE 1 TABLET BY MOUTH TWICE A DAY AS NEEDED FOR CRAMPING OR DIARRHEA OR LOOSE STOOLS 60 tablet 0  . JANUVIA 100 MG tablet TAKE 1 TABLET BY MOUTH EVERY DAY 90 tablet 0  .  lansoprazole (PREVACID) 15 MG capsule Take 15 mg by mouth as needed (for heart burn).     Vladimir Faster Glycol-Propyl Glycol (SYSTANE OP) Apply 1 drop to eye 4 (four) times daily - after meals and at bedtime.    . Probiotic Product (PRO-BIOTIC BLEND PO) Take 1 tablet by mouth as needed.     . rosuvastatin (CRESTOR) 20 MG tablet TAKE 1 TABLET BY MOUTH DAILY (REPEAT LABS ARE DUE NOW) 90 tablet 1  . traMADol (ULTRAM) 50 MG tablet Take 1 tablet (50 mg total) by mouth every 6 (six) hours as needed. 15 tablet 0  . XARELTO 20 MG TABS tablet TAKE 1 TABLET BY MOUTH EVERY DAY WITH SUPPER 60 tablet 0   No current facility-administered medications on file prior to visit.    Allergies  Allergen Reactions  . Oxycodone Itching  . Vicodin [Hydrocodone-Acetaminophen] Itching  . Neomycin-Bacitracin Zn-Polymyx Other (See Comments)    Red , swollen eye  . Sulfonamide Derivatives Other (See Comments)    Just had reaction when taking during pregnancy- last child birth 31   Family History  Problem Relation Age of Onset  . Kidney disease Mother   . Heart attack Father   . Heart attack Maternal Grandmother   . Valvular heart disease Son        valve replacement at 69  . Cancer Neg Hx     PE: BP 114/64   Pulse 82   Ht 5' (1.524 m)   Wt 210 lb 12.8 oz (95.6 kg)   SpO2 96%   BMI 41.17 kg/m  Wt Readings from Last 3 Encounters:  06/28/18 210 lb 12.8 oz (95.6 kg)  06/22/18 210 lb 12.8 oz (95.6 kg)  05/11/18 215 lb 9.6 oz (97.8 kg)   Constitutional: overweight, in NAD Eyes: PERRLA, EOMI, no exophthalmos ENT: moist mucous membranes, no thyromegaly, no cervical  lymphadenopathy Cardiovascular: RRR, +2/6 SEM, + peri-ankle edema bilaterally Respiratory: CTA B Gastrointestinal: abdomen soft, NT, ND, BS+ Musculoskeletal: no deformities, strength intact in all 4 Skin: moist, warm, no rashes Neurological: no tremor with outstretched hands, DTR normal in all 4  ASSESSMENT: 1. DM2, non-insulin-dependent, uncontrolled, with complications - h/o 2x aortic dissection, now SEM - Dr Sallyanne Kuster  2. HL  3. Obesity class 3  PLAN:  1. Patient with long-standing, previously fairly well-controlled diabetes, on oral antidiabetic regimen with Januvia and glipizide XL.  Her diabetes worsened after the death of her husband in the fall 2017.  At last visit, sugars were higher in the morning.  We moved Januvia at waking up, which is 8 AM and she does not usually eat or drink anything until 10 AM when she actually checks her sugars. - Since last visit, she tried to move the glipizide at different times of the day but she found out that the best time to take it is at waking up.  We will continue with this, and continue to take Januvia at the same time. - Sugars are at or close to goal, with the exception of slightly higher ones in the morning.  These usually were taken when she was experimenting with taking glipizide at different times of the day. - I do not feel we need to intensify the regimen at this point - I suggested: Patient Instructions  Please continue: - Januvia 100 mg at waking up - Glipizide 2.5 mg at waking up  Please return in 4 months with your sugar log  - today, HbA1c is 6.8% (lower) - continue  checking sugars at different times of the day - check 1x a day, rotating checks - advised for yearly eye exams >> she is UTD - Return to clinic in 4 mo with sugar log    2. HL - Reviewed latest lipid panel from 05/2017: LDL much improved from previous check, now 70 Lab Results  Component Value Date   CHOL 143 06/28/2017   HDL 51.40 06/28/2017   LDLCALC 70  06/28/2017   LDLDIRECT 217.7 09/13/2011   TRIG 107.0 06/28/2017   CHOLHDL 3 06/28/2017  - Continues Crestor and fenofibrate without side effects  3. Obesity class III -Weight is not significantly changed compared to last visit - I am hoping we would be able to stop glipizide at next visit, since this can contribute to weight gain  Philemon Kingdom, MD PhD [facility]

## 2018-06-28 NOTE — Patient Instructions (Addendum)
Please continue: - Januvia 100 mg at waking up - Glipizide 2.5 mg at waking up  Please return in 4 months with your sugar log

## 2018-07-20 LAB — CUP PACEART INCLINIC DEVICE CHECK
Implantable Lead Implant Date: 20071016
Implantable Lead Location: 753860
Lead Channel Setting Pacing Amplitude: 2 V
MDC IDC LEAD IMPLANT DT: 20071016
MDC IDC LEAD LOCATION: 753859
MDC IDC PG IMPLANT DT: 20141001
MDC IDC SESS DTM: 20190822143921
MDC IDC SET LEADCHNL RA PACING AMPLITUDE: 1.5 V
MDC IDC SET LEADCHNL RV PACING PULSEWIDTH: 0.4 ms
MDC IDC SET LEADCHNL RV SENSING SENSITIVITY: 5.6 mV

## 2018-07-21 ENCOUNTER — Other Ambulatory Visit: Payer: Self-pay | Admitting: Family Medicine

## 2018-07-21 DIAGNOSIS — F418 Other specified anxiety disorders: Secondary | ICD-10-CM

## 2018-07-21 NOTE — Telephone Encounter (Signed)
Database ran and is on your desk for review.  Last filled per database: 06/22/18 Last written: 06/22/18 Last ov: 05/11/18 Next ov: none Contract: 10/27/18 UDS: 10/27/18

## 2018-07-30 LAB — CUP PACEART REMOTE DEVICE CHECK
Battery Impedance: 278 Ohm
Brady Statistic AP VP Percent: 30 %
Brady Statistic AP VS Percent: 70 %
Brady Statistic AS VP Percent: 0 %
Brady Statistic AS VS Percent: 0 %
Implantable Lead Implant Date: 20071016
Implantable Lead Implant Date: 20071016
Implantable Lead Location: 753859
Implantable Lead Model: 4092
Implantable Lead Model: 5594
Lead Channel Impedance Value: 399 Ohm
Lead Channel Impedance Value: 747 Ohm
Lead Channel Pacing Threshold Amplitude: 0.625 V
Lead Channel Pacing Threshold Amplitude: 0.625 V
Lead Channel Pacing Threshold Pulse Width: 0.4 ms
Lead Channel Setting Pacing Amplitude: 1.5 V
Lead Channel Setting Pacing Pulse Width: 0.4 ms
Lead Channel Setting Sensing Sensitivity: 5.6 mV
MDC IDC LEAD LOCATION: 753860
MDC IDC MSMT BATTERY REMAINING LONGEVITY: 113 mo
MDC IDC MSMT BATTERY VOLTAGE: 2.79 V
MDC IDC MSMT LEADCHNL RV PACING THRESHOLD PULSEWIDTH: 0.4 ms
MDC IDC PG IMPLANT DT: 20141001
MDC IDC SESS DTM: 20190724200925
MDC IDC SET LEADCHNL RV PACING AMPLITUDE: 2 V

## 2018-08-16 ENCOUNTER — Other Ambulatory Visit: Payer: Self-pay | Admitting: Internal Medicine

## 2018-08-16 DIAGNOSIS — IMO0002 Reserved for concepts with insufficient information to code with codable children: Secondary | ICD-10-CM

## 2018-08-16 DIAGNOSIS — E1151 Type 2 diabetes mellitus with diabetic peripheral angiopathy without gangrene: Secondary | ICD-10-CM

## 2018-08-16 DIAGNOSIS — E1165 Type 2 diabetes mellitus with hyperglycemia: Principal | ICD-10-CM

## 2018-08-18 ENCOUNTER — Other Ambulatory Visit: Payer: Self-pay | Admitting: Family Medicine

## 2018-08-18 ENCOUNTER — Other Ambulatory Visit: Payer: Self-pay | Admitting: Internal Medicine

## 2018-08-18 ENCOUNTER — Other Ambulatory Visit: Payer: Self-pay | Admitting: Cardiovascular Disease

## 2018-08-18 DIAGNOSIS — F418 Other specified anxiety disorders: Secondary | ICD-10-CM

## 2018-08-18 DIAGNOSIS — E1151 Type 2 diabetes mellitus with diabetic peripheral angiopathy without gangrene: Secondary | ICD-10-CM

## 2018-08-18 DIAGNOSIS — E1165 Type 2 diabetes mellitus with hyperglycemia: Principal | ICD-10-CM

## 2018-08-18 DIAGNOSIS — I10 Essential (primary) hypertension: Secondary | ICD-10-CM

## 2018-08-18 DIAGNOSIS — IMO0002 Reserved for concepts with insufficient information to code with codable children: Secondary | ICD-10-CM

## 2018-08-18 NOTE — Telephone Encounter (Signed)
Next UDS due on 10/27/2018 Last office visit 05/11/2018 Last refill on 07/21/2018  #90 no refills.

## 2018-09-13 ENCOUNTER — Other Ambulatory Visit: Payer: Self-pay | Admitting: *Deleted

## 2018-09-13 ENCOUNTER — Other Ambulatory Visit: Payer: Self-pay | Admitting: Family Medicine

## 2018-09-13 NOTE — Telephone Encounter (Signed)
Can not take tramadol and xanax.  Looks like tramadol was rx in June and meant to be temporary

## 2018-09-13 NOTE — Telephone Encounter (Signed)
[facility] [address] faxed a request for tramadol  Database ran 07/21/18 and is in media for review.  Last written: 05/05/18 Last ov: 05/11/18 Next ov: none Contract: 10/27/18 UDS: 10/27/18

## 2018-09-14 ENCOUNTER — Encounter: Payer: Self-pay | Admitting: Cardiovascular Disease

## 2018-09-15 NOTE — Telephone Encounter (Signed)
Pt. states she has not been taking tramadol really since her pyleonephritis, but "every once in a while I still have back pain". Pt. states she understands, and is not really requesting tramadol, did not need to make an OV. No other concerns at this time.

## 2018-09-17 ENCOUNTER — Other Ambulatory Visit: Payer: Self-pay | Admitting: Family Medicine

## 2018-09-17 DIAGNOSIS — F418 Other specified anxiety disorders: Secondary | ICD-10-CM

## 2018-09-18 NOTE — Telephone Encounter (Signed)
Last OV on 05/11/2018 Last refill for Alprazolam on 08/18/2018  #90 no refills UDS last done on 10/27/2017----next is due on 10/27/2018

## 2018-09-20 ENCOUNTER — Other Ambulatory Visit: Payer: Self-pay | Admitting: *Deleted

## 2018-09-20 ENCOUNTER — Telehealth: Payer: Self-pay

## 2018-09-20 ENCOUNTER — Ambulatory Visit (INDEPENDENT_AMBULATORY_CARE_PROVIDER_SITE_OTHER): Payer: Medicare Other | Admitting: *Deleted

## 2018-09-20 DIAGNOSIS — I495 Sick sinus syndrome: Secondary | ICD-10-CM

## 2018-09-20 MED ORDER — ROSUVASTATIN CALCIUM 20 MG PO TABS: ORAL_TABLET | ORAL | 0 refills | Status: DC

## 2018-09-20 NOTE — Telephone Encounter (Signed)
Spoke with pt and reminded pt of remote transmission that is due today. Pt verbalized understanding.   

## 2018-09-21 ENCOUNTER — Encounter: Payer: Self-pay | Admitting: Cardiology

## 2018-09-21 NOTE — Progress Notes (Signed)
Remote pacemaker transmission.   

## 2018-09-29 ENCOUNTER — Telehealth: Payer: Self-pay | Admitting: Cardiovascular Disease

## 2018-09-29 NOTE — Telephone Encounter (Signed)
  Error No note needed

## 2018-10-02 ENCOUNTER — Telehealth: Payer: Self-pay | Admitting: Cardiovascular Disease

## 2018-10-02 NOTE — Telephone Encounter (Signed)
Patient calling, she is upset she hasn't heard back yet.  Please call.

## 2018-10-02 NOTE — Telephone Encounter (Signed)
  1. What dental office are you calling from?[facility]   2. What is your office phone number? [phone]  3. What is your fax number? [phone]  4. What type of procedure is the patient having performed? Perioscaling  5. What date is procedure scheduled or is the patient there now? 10/03/18  6. What is your question (ex. Antibiotics prior to procedure, holding medication-we need to know how long dentist wants pt to hold med)? Does she need premed  Please contact by phone

## 2018-10-03 NOTE — Telephone Encounter (Signed)
Will forward to Dr Sallyanne Kuster for review

## 2018-10-03 NOTE — Telephone Encounter (Signed)
New message:     Sonia Baller is calling from the dentist office and calling in reference to the patients clearance. Fax # [phone]

## 2018-10-03 NOTE — Telephone Encounter (Signed)
[name] and sent to office as requested

## 2018-10-03 NOTE — Telephone Encounter (Signed)
No premed with antibiotics is necessary. No meds need to be held

## 2018-10-12 LAB — CUP PACEART REMOTE DEVICE CHECK
Battery Impedance: 302 Ohm
Brady Statistic AP VS Percent: 26 %
Brady Statistic AS VS Percent: 0 %
Date Time Interrogation Session: 20191023150400
Implantable Lead Implant Date: 20071016
Implantable Lead Implant Date: 20071016
Implantable Lead Model: 4092
Implantable Lead Model: 5594
Lead Channel Impedance Value: 696 Ohm
Lead Channel Pacing Threshold Amplitude: 0.5 V
Lead Channel Pacing Threshold Pulse Width: 0.4 ms
Lead Channel Sensing Intrinsic Amplitude: 16 mV
Lead Channel Setting Pacing Amplitude: 1.5 V
Lead Channel Setting Sensing Sensitivity: 5.6 mV
MDC IDC LEAD LOCATION: 753859
MDC IDC LEAD LOCATION: 753860
MDC IDC MSMT BATTERY REMAINING LONGEVITY: 101 mo
MDC IDC MSMT BATTERY VOLTAGE: 2.79 V
MDC IDC MSMT LEADCHNL RA IMPEDANCE VALUE: 393 Ohm
MDC IDC MSMT LEADCHNL RA PACING THRESHOLD AMPLITUDE: 0.625 V
MDC IDC MSMT LEADCHNL RA PACING THRESHOLD PULSEWIDTH: 0.4 ms
MDC IDC PG IMPLANT DT: 20141001
MDC IDC SET LEADCHNL RV PACING AMPLITUDE: 2 V
MDC IDC SET LEADCHNL RV PACING PULSEWIDTH: 0.4 ms
MDC IDC STAT BRADY AP VP PERCENT: 70 %
MDC IDC STAT BRADY AS VP PERCENT: 3 %

## 2018-10-13 ENCOUNTER — Other Ambulatory Visit: Payer: Self-pay | Admitting: Cardiovascular Disease

## 2018-10-18 ENCOUNTER — Other Ambulatory Visit: Payer: Self-pay | Admitting: Family Medicine

## 2018-10-18 DIAGNOSIS — F418 Other specified anxiety disorders: Secondary | ICD-10-CM

## 2018-10-19 NOTE — Telephone Encounter (Signed)
Database ran in august and is media for review  Last written: 09/18/18 Last ov: 05/11/18 Next ov: none Contract: need UDS: 10/27/18

## 2018-10-25 ENCOUNTER — Other Ambulatory Visit: Payer: Self-pay

## 2018-10-25 DIAGNOSIS — F418 Other specified anxiety disorders: Secondary | ICD-10-CM

## 2018-10-25 MED ORDER — ALPRAZOLAM 1 MG PO TABS: 0.5000 mg | ORAL_TABLET | Freq: Three times a day (TID) | ORAL | 0 refills | Status: DC | PRN

## 2018-10-25 NOTE — Telephone Encounter (Signed)
Author received fax from [facility] stating that alprazolam 0.5mg  was on back-order, and pt. Was requesting 0.25mg  in the meantime. [facility], but [facility] 1[facility] . Order for alprazolam 1mg  left pended for Dr. Ivy Lynn review.

## 2018-10-28 DIAGNOSIS — R109 Unspecified abdominal pain: Secondary | ICD-10-CM | POA: Diagnosis not present

## 2018-10-28 DIAGNOSIS — R0600 Dyspnea, unspecified: Secondary | ICD-10-CM | POA: Diagnosis not present

## 2018-10-28 DIAGNOSIS — I7100 Dissection of unspecified site of aorta: Secondary | ICD-10-CM | POA: Diagnosis not present

## 2018-10-28 DIAGNOSIS — R0602 Shortness of breath: Secondary | ICD-10-CM | POA: Diagnosis not present

## 2018-10-28 DIAGNOSIS — I959 Hypotension, unspecified: Secondary | ICD-10-CM | POA: Diagnosis not present

## 2018-10-28 DIAGNOSIS — Z87891 Personal history of nicotine dependence: Secondary | ICD-10-CM | POA: Diagnosis not present

## 2018-10-28 DIAGNOSIS — R0902 Hypoxemia: Secondary | ICD-10-CM | POA: Diagnosis not present

## 2018-10-28 DIAGNOSIS — Z950 Presence of cardiac pacemaker: Secondary | ICD-10-CM | POA: Diagnosis not present

## 2018-10-28 DIAGNOSIS — I10 Essential (primary) hypertension: Secondary | ICD-10-CM | POA: Diagnosis not present

## 2018-10-28 DIAGNOSIS — I7101 Dissection of thoracic aorta: Secondary | ICD-10-CM | POA: Diagnosis not present

## 2018-10-28 DIAGNOSIS — K5730 Diverticulosis of large intestine without perforation or abscess without bleeding: Secondary | ICD-10-CM | POA: Diagnosis not present

## 2018-10-28 DIAGNOSIS — R0789 Other chest pain: Secondary | ICD-10-CM | POA: Diagnosis not present

## 2018-10-28 DIAGNOSIS — D3501 Benign neoplasm of right adrenal gland: Secondary | ICD-10-CM | POA: Diagnosis not present

## 2018-10-28 DIAGNOSIS — R079 Chest pain, unspecified: Secondary | ICD-10-CM | POA: Diagnosis not present

## 2018-10-28 DIAGNOSIS — N200 Calculus of kidney: Secondary | ICD-10-CM | POA: Diagnosis not present

## 2018-10-29 DIAGNOSIS — I7101 Dissection of thoracic aorta: Secondary | ICD-10-CM | POA: Diagnosis not present

## 2018-10-29 DIAGNOSIS — I5021 Acute systolic (congestive) heart failure: Secondary | ICD-10-CM | POA: Diagnosis not present

## 2018-10-29 DIAGNOSIS — Z45018 Encounter for adjustment and management of other part of cardiac pacemaker: Secondary | ICD-10-CM | POA: Diagnosis not present

## 2018-10-29 DIAGNOSIS — Z8679 Personal history of other diseases of the circulatory system: Secondary | ICD-10-CM | POA: Diagnosis not present

## 2018-10-29 DIAGNOSIS — R0689 Other abnormalities of breathing: Secondary | ICD-10-CM | POA: Diagnosis not present

## 2018-10-29 DIAGNOSIS — E119 Type 2 diabetes mellitus without complications: Secondary | ICD-10-CM | POA: Diagnosis not present

## 2018-10-29 DIAGNOSIS — R918 Other nonspecific abnormal finding of lung field: Secondary | ICD-10-CM | POA: Diagnosis not present

## 2018-10-29 DIAGNOSIS — Z950 Presence of cardiac pacemaker: Secondary | ICD-10-CM | POA: Diagnosis not present

## 2018-10-29 DIAGNOSIS — E118 Type 2 diabetes mellitus with unspecified complications: Secondary | ICD-10-CM | POA: Diagnosis not present

## 2018-10-29 DIAGNOSIS — I517 Cardiomegaly: Secondary | ICD-10-CM | POA: Diagnosis not present

## 2018-10-29 DIAGNOSIS — I350 Nonrheumatic aortic (valve) stenosis: Secondary | ICD-10-CM | POA: Diagnosis not present

## 2018-10-29 DIAGNOSIS — Z91040 Latex allergy status: Secondary | ICD-10-CM | POA: Diagnosis not present

## 2018-10-29 DIAGNOSIS — R0902 Hypoxemia: Secondary | ICD-10-CM | POA: Diagnosis not present

## 2018-10-29 DIAGNOSIS — I10 Essential (primary) hypertension: Secondary | ICD-10-CM | POA: Diagnosis not present

## 2018-10-29 DIAGNOSIS — I5022 Chronic systolic (congestive) heart failure: Secondary | ICD-10-CM | POA: Diagnosis not present

## 2018-10-29 DIAGNOSIS — Z85828 Personal history of other malignant neoplasm of skin: Secondary | ICD-10-CM | POA: Diagnosis not present

## 2018-10-29 DIAGNOSIS — Z7984 Long term (current) use of oral hypoglycemic drugs: Secondary | ICD-10-CM | POA: Diagnosis not present

## 2018-10-29 DIAGNOSIS — R079 Chest pain, unspecified: Secondary | ICD-10-CM | POA: Diagnosis not present

## 2018-10-29 DIAGNOSIS — I5020 Unspecified systolic (congestive) heart failure: Secondary | ICD-10-CM | POA: Diagnosis not present

## 2018-10-29 DIAGNOSIS — I259 Chronic ischemic heart disease, unspecified: Secondary | ICD-10-CM | POA: Diagnosis present

## 2018-10-29 DIAGNOSIS — E785 Hyperlipidemia, unspecified: Secondary | ICD-10-CM | POA: Diagnosis present

## 2018-10-29 DIAGNOSIS — Z9889 Other specified postprocedural states: Secondary | ICD-10-CM | POA: Diagnosis not present

## 2018-10-29 DIAGNOSIS — R633 Feeding difficulties: Secondary | ICD-10-CM | POA: Diagnosis not present

## 2018-10-29 DIAGNOSIS — J811 Chronic pulmonary edema: Secondary | ICD-10-CM | POA: Diagnosis not present

## 2018-10-29 DIAGNOSIS — I712 Thoracic aortic aneurysm, without rupture: Secondary | ICD-10-CM | POA: Diagnosis not present

## 2018-10-29 DIAGNOSIS — I495 Sick sinus syndrome: Secondary | ICD-10-CM | POA: Diagnosis present

## 2018-10-29 DIAGNOSIS — I429 Cardiomyopathy, unspecified: Secondary | ICD-10-CM | POA: Diagnosis present

## 2018-10-29 DIAGNOSIS — J9811 Atelectasis: Secondary | ICD-10-CM | POA: Diagnosis not present

## 2018-10-29 DIAGNOSIS — Z87891 Personal history of nicotine dependence: Secondary | ICD-10-CM | POA: Diagnosis not present

## 2018-10-29 DIAGNOSIS — I441 Atrioventricular block, second degree: Secondary | ICD-10-CM | POA: Diagnosis present

## 2018-10-29 DIAGNOSIS — J90 Pleural effusion, not elsewhere classified: Secondary | ICD-10-CM | POA: Diagnosis not present

## 2018-10-29 DIAGNOSIS — I110 Hypertensive heart disease with heart failure: Secondary | ICD-10-CM | POA: Diagnosis present

## 2018-10-29 DIAGNOSIS — F419 Anxiety disorder, unspecified: Secondary | ICD-10-CM | POA: Diagnosis present

## 2018-10-31 ENCOUNTER — Ambulatory Visit: Payer: Medicare Other | Admitting: Internal Medicine

## 2018-11-03 ENCOUNTER — Other Ambulatory Visit: Payer: Self-pay | Admitting: Family Medicine

## 2018-11-03 DIAGNOSIS — I10 Essential (primary) hypertension: Secondary | ICD-10-CM

## 2018-11-03 MED ORDER — HEPARIN SODIUM (PORCINE) 5000 UNIT/ML IJ SOLN
5000.00 | INTRAMUSCULAR | Status: DC
Start: 2018-11-03 — End: 2018-11-03

## 2018-11-03 MED ORDER — ALPRAZOLAM 0.5 MG PO TABS
0.50 | ORAL_TABLET | ORAL | Status: DC
Start: ? — End: 2018-11-03

## 2018-11-03 MED ORDER — ACETAMINOPHEN 325 MG PO TABS
650.00 | ORAL_TABLET | ORAL | Status: DC
Start: ? — End: 2018-11-03

## 2018-11-03 MED ORDER — ATORVASTATIN CALCIUM 40 MG PO TABS
40.00 | ORAL_TABLET | ORAL | Status: DC
Start: 2018-11-03 — End: 2018-11-03

## 2018-11-03 MED ORDER — FUROSEMIDE 40 MG PO TABS
40.00 | ORAL_TABLET | ORAL | Status: DC
Start: 2018-11-04 — End: 2018-11-03

## 2018-11-03 MED ORDER — HYDRALAZINE HCL 20 MG/ML IJ SOLN
5.00 | INTRAMUSCULAR | Status: DC
Start: ? — End: 2018-11-03

## 2018-11-03 MED ORDER — OXYCODONE HCL 5 MG PO TABS
2.50 | ORAL_TABLET | ORAL | Status: DC
Start: ? — End: 2018-11-03

## 2018-11-03 MED ORDER — INSULIN LISPRO 100 UNIT/ML ~~LOC~~ SOLN
0.00 | SUBCUTANEOUS | Status: DC
Start: 2018-11-03 — End: 2018-11-03

## 2018-11-03 MED ORDER — CARVEDILOL 3.125 MG PO TABS
6.25 | ORAL_TABLET | ORAL | Status: DC
Start: 2018-11-03 — End: 2018-11-03

## 2018-11-03 MED ORDER — OXYCODONE HCL 5 MG PO TABS
5.00 | ORAL_TABLET | ORAL | Status: DC
Start: ? — End: 2018-11-03

## 2018-11-03 MED ORDER — MAGNESIUM OXIDE 400 MG PO TABS
400.00 | ORAL_TABLET | ORAL | Status: DC
Start: 2018-11-03 — End: 2018-11-03

## 2018-11-03 MED ORDER — RIVAROXABAN 20 MG PO TABS
20.00 | ORAL_TABLET | ORAL | Status: DC
Start: ? — End: 2018-11-03

## 2018-11-03 MED ORDER — CITALOPRAM HYDROBROMIDE 20 MG PO TABS
20.00 | ORAL_TABLET | ORAL | Status: DC
Start: 2018-11-04 — End: 2018-11-03

## 2018-11-05 DIAGNOSIS — I509 Heart failure, unspecified: Secondary | ICD-10-CM | POA: Diagnosis not present

## 2018-11-05 DIAGNOSIS — I350 Nonrheumatic aortic (valve) stenosis: Secondary | ICD-10-CM | POA: Diagnosis not present

## 2018-11-05 DIAGNOSIS — I7101 Dissection of thoracic aorta: Secondary | ICD-10-CM | POA: Diagnosis not present

## 2018-11-05 DIAGNOSIS — I10 Essential (primary) hypertension: Secondary | ICD-10-CM | POA: Diagnosis not present

## 2018-11-06 ENCOUNTER — Telehealth: Payer: Self-pay | Admitting: Cardiovascular Disease

## 2018-11-06 NOTE — Progress Notes (Addendum)
[facility] Consult Note   Referring Physician: PCP: [name], DO PCP-Cardiologist: No primary care provider on file.   HPI:  [name] is a 72 y.o. female with chronic diastolic CHF with newly reduced EF, NICM, DM2, h/o repaired type A aortic dissection (resuspended native aortic valve), SSS with sinus node arrest s/p PPM 2007, generator exchange 2014, morbid obesity, and HTN.   Referred by Dr. Kennith Center at [facility] for further evaluation of reduced EF.   She has yearly follow up with Dr. Madolyn Frieze s/p Type A aortic dissection repair. Stable surgical repair by CT 01/2018.  Last seen in [facility] clinic 06/22/18. Followed chronically by Dr. Sallyanne Kuster. At that visit, she was stable, but there was some concern over worsening AS by Echo as below. At that visit, she was only RV pacing 30% of the time.   Pt was recently admitted to [facility] for 3 day history of fatigue, SOB, chills, and malaise with severe chest pain radiating to her back. CT findings concerning for worsening type-B dissection. CT (Outside hospital) concerning for worsening type-B dissection extending from just distal to the L subclavian artery down into the L CIA.   (Please note the [facility] notes go back and forth between calling it type-A and type-B). This is the read from Cardiology consult on 11/01/18.   Echo 10/29/18 showed LVEF down to 35% with mildly decreased RV, mild pHTN, and Mod/severe AS (low flo/low gradient).   L/RHC 11/02/18 with compensated filling pressures and stable CO. Dr. Haroldine Laws contacted with newly low EF for consultation as outpatient.  Plan was to continue to pursue BP control and no surgical intervention was recommended during that admission with relief of her chest pain. On 11/03/18, noted to have 11.9% AS-VP, and 71.7% AP-VP. AV delay increased to allow for less ventricular pacing.   She presents today to establish in the HF clinic. Has been feeling good since discharge from hospital, just taking  it easy. She has been newly started on Benicar, and has been lightheaded (No orthostasis on exam today). She denies SOB with her ADLs around her house. No further chest pain, no tachypalpitations. She has not noticed any difference with changes made to her pacing. She denies peripheral edema or orthopnea. Her friend chimes in that she has had a "significant amount of emotional distress" due to a family issue that she does not care to elaborate further on.   Labs [facility] 11/03/18 Cr 0.68, K 4.3.   CTA 01/2018 showing aortic dissection distal to the ascending arch repair with the dissection extending down the descending aorta into the L CIA. No R CIA involvement. Distal aortic arch shows 3.4cm aneurysm. Involvement of the thoracic and abdominal aorta was stable at that time.  Echo 11/2017 LVEF 55-60%, Grade 1 DD, Peak and Mean gradients through AV are 40 and 24 mm Hg respectively consistent with moderate AS.   Echo 12/2015 LVEF 55-60%, Grade 1 DD, Mild AS, Mod LAE, Mild RAE.   [facility] 11/02/18 ([facility]) - No angiographic evidence of significant disease.  RA mean 6 RV 45/10 PA 45/16 (26) PCWP 12 AO 90% Cardiac Output (Fick) 4.6 Cardiac Index (Fick) 2.4 SVR 1443  PVR 3 WU LVEDP 18 Mean AV gradient: 21.2 mmHg  Peak to peak gradient: 16 Valve area (gorlin): 1.1 cm2 (index 0.6)  Review of Systems: [y] = yes, [ ]  = no   General: Weight gain [ ] ; Weight loss [ ] ; Anorexia [ ] ; Fatigue [y]; Fever [ ] ;  Chills [ ] ; Weakness [ ]   Cardiac: Chest pain/pressure [y]; Resting SOB [ ] ; Exertional SOB [ ] ; Orthopnea [ ] ; Pedal Edema [ ] ; Palpitations [ ] ; Syncope [ ] ; Presyncope [ ] ; Paroxysmal nocturnal dyspnea[ ]   Pulmonary: Cough [ ] ; Wheezing[ ] ; Hemoptysis[ ] ; Sputum [ ] ; Snoring [ ]   GI: Vomiting[ ] ; Dysphagia[ ] ; Melena[ ] ; Hematochezia [ ] ; Heartburn[ ] ; Abdominal pain [ ] ; Constipation [ ] ; Diarrhea [ ] ; BRBPR [ ]   GU: Hematuria[ ] ; Dysuria [ ] ; Nocturia[ ]   Vascular: Pain in legs with walking [ ] ; Pain in  feet with lying flat [ ] ; Non-healing sores [ ] ; Stroke [ ] ; TIA [ ] ; Slurred speech [ ] ;  Neuro: Headaches[ ] ; Vertigo[ ] ; Seizures[ ] ; Paresthesias[ ] ;Blurred vision [ ] ; Diplopia [ ] ; Vision changes [ ]   Ortho/Skin: Arthritis [y]; Joint pain [y]; Muscle pain [ ] ; Joint swelling [ ] ; Back Pain [ ] ; Rash [ ]   Psych: Depression[ ] ; Anxiety[ ]   Heme: Bleeding problems [ ] ; Clotting disorders [ ] ; Anemia [ ]   Endocrine: Diabetes [y]; Thyroid dysfunction[ ]    Past Medical History:  Diagnosis Date  . Acute thoracic aortic dissection [facility]) 2008   emergency surgery - [place]  . Anxiety   . Cancer ([place])    skin  . Diabetes mellitus   . Diverticulosis   . GERD (gastroesophageal reflux disease)   . Hemorrhoids   . Hyperlipidemia   . Hypertension   . Kidney stones   . Sinus node dysfunction (H[facility])    hx of PPM    Current Outpatient Medications  Medication Sig Dispense Refill  . ALPRAZolam (XANAX) 1 MG tablet Take 0.5 tablets (0.5 mg total) by mouth 3 (three) times daily as needed for anxiety. 30 tablet 0  . carvedilol (COREG) 25 MG tablet TAKE 1 TABLET BY MOUTH TWICE A DAY WITH A MEAL 180 tablet 3  . citalopram (CELEXA) 40 MG tablet TAKE 1 TABLET BY MOUTH EVERY DAY 90 tablet 0  . fenofibrate 160 MG tablet TAKE 1 TABLET (160 MG TOTAL) BY MOUTH DAILY. 90 tablet 1  . furosemide (LASIX) 40 MG tablet TAKE 1 TABLET BY MOUTH EVERY DAY 90 tablet 1  . glipiZIDE (GLUCOTROL XL) 2.5 MG 24 hr tablet TAKE 1 TABLET (2.5 MG TOTAL) BY MOUTH DAILY BEFORE BREAKFAST. 90 tablet 1  . hyoscyamine (LEVSIN SL) 0.125 MG SL tablet TAKE 1 TABLET BY MOUTH TWICE A DAY AS NEEDED FOR CRAMPING OR DIARRHEA OR LOOSE STOOLS 60 tablet 0  . JANUVIA 100 MG tablet TAKE 1 TABLET BY MOUTH EVERY DAY 30 tablet 2  . lansoprazole (PREVACID) 15 MG capsule Take 15 mg by mouth as needed (for heart burn).     . magnesium oxide (MAG-OX) 400 MG tablet Take 400 mg by mouth daily.    [name] olmesartan (BENICAR) 20 MG tablet Take 20 mg by mouth  daily.    . Omega-3 Fatty Acids (FISH OIL) 1000 MG CAPS Take by mouth.    Vladimir Faster Glycol-Propyl Glycol (SYSTANE OP) Apply 1 drop to eye 4 (four) times daily - after meals and at bedtime.    . Probiotic Product (PRO-BIOTIC BLEND PO) Take 1 tablet by mouth as needed.     . rosuvastatin (CRESTOR) 20 MG tablet TAKE 1 TABLET BY MOUTH DAILY (needs ov and REPEAT LABS ARE DUE NOW) 90 tablet 0  . XARELTO 20 MG TABS tablet TAKE 1 TABLET BY MOUTH EVERY DAY WITH SUPPER 30 tablet 1  .  cholecalciferol (VITAMIN D) 1000 UNITS tablet Take 1,000 Units by mouth daily.    . fluticasone (FLONASE) 50 MCG/ACT nasal spray USE 2 SPRAYS IN EACH NOSTIRL EVERY DAY 16 g 1   No current facility-administered medications for this encounter.     Allergies  Allergen Reactions  . Oxycodone Itching  . Vicodin [Hydrocodone-Acetaminophen] Itching  . Neomycin-Bacitracin Zn-Polymyx Other (See Comments)    Red , swollen eye  . Sulfonamide Derivatives Other (See Comments)    Just had reaction when taking during pregnancy- last child birth 46      Social History   Socioeconomic History  . Marital status: Widowed    Spouse name: Not on file  . Number of children: 2  . Years of education: Not on file  . Highest education level: Not on file  Occupational History    Employer: RETIRED  Social Needs  . Financial resource strain: Not on file  . Food insecurity:    Worry: Not on file    Inability: Not on file  . Transportation needs:    Medical: Not on file    Non-medical: Not on file  Tobacco Use  . Smoking status: Former Smoker    Last attempt to quit: 09/04/1993    Years since quitting: 25.1  . Smokeless tobacco: Never Used  Substance and Sexual Activity  . Alcohol use: No  . Drug use: No  . Sexual activity: Not Currently  Lifestyle  . Physical activity:    Days per week: Not on file    Minutes per session: Not on file  . Stress: Not on file  Relationships  . Social connections:    Talks on phone: Not  on file    Gets together: Not on file    Attends religious service: Not on file    Active member of club or organization: Not on file    Attends meetings of clubs or organizations: Not on file    Relationship status: Not on file  . Intimate partner violence:    Fear of current or ex partner: Not on file    Emotionally abused: Not on file    Physically abused: Not on file    Forced sexual activity: Not on file  Other Topics Concern  . Not on file  Social History Narrative   Lives at home and husband was just moved to [place]      Family History  Problem Relation Age of Onset  . Kidney disease Mother   . Heart attack Father   . Heart attack Maternal Grandmother   . Valvular heart disease Son        valve replacement at 62  . Cancer Neg Hx     Vitals:   11/07/18 1005  BP: (!) 108/52  Pulse: 64  SpO2: 92%  Weight: 91.6 kg (202 lb)  Height: 5' (1.524 m)   Wt Readings from Last 3 Encounters:  11/07/18 91.6 kg (202 lb)  06/28/18 95.6 kg (210 lb 12.8 oz)  06/22/18 95.6 kg (210 lb 12.8 oz)    Orthostatis Sitting 114/60 Standing 118/64   PHYSICAL EXAM: General:  NAD HEENT: normal Neck: supple. no JVD. Carotids 2+ bilat; no bruits. No lymphadenopathy or thyromegaly appreciated. Cor: PMI nondisplaced. Regular rate & rhythm. 2/6 AS, S2 audible.  Lungs: clear Abdomen: soft, nontender, nondistended. No hepatosplenomegaly. No bruits or masses. Good bowel sounds. Extremities: no cyanosis, clubbing, rash, edema Neuro: alert & oriented x 3, cranial nerves grossly intact. moves all 4  extremities w/o difficulty. Affect pleasant.  ASSESSMENT & PLAN:  1. Chronic systolic CHF - LVEF 99-83% on Echo 12/23/17 -> 35% by Echo 10/29/18 at [facility] with mildly decreased RV, mild pHTN, and Mod/severe AS (low flo/low gradient) - Normal coronaries by cath 11/02/18 at [facility] - ? Etiology as ++ RV pacing vs Takotsubo cardiomyopathy in setting of "significant family stress" - NYHA II-III symptoms  chronically. - Volume status stable on exam. - Continue lasix 40 mg daily.    - Continue coreg 25 mg BID - She is on Benicar 20 mg daily. Will decrease to 10 mg daily with lightheadedness. Not orthostatic on exam.  - Reinforced fluid restriction to < 2 L daily, sodium restriction to less than 2000 mg daily, and the importance of daily weights.   2. AS:  - Per Dr. Sallyanne Kuster, Echo 12/23/17 shows at least slight worsening of AS compared to previous Echo. - Echo 10/29/18 showed LVEF down to 35% with mildly decreased RV, mild pHTN, and Mod/severe AS (low flo/low gradient).  - By [facility] 11/02/18 Mean AV gradient: 21.2 mmHg, Valve area (gorlin): 1.1 cm2 (index 0.6) consistent with Mod AS.  3. SSS s/p PM - Dr. Loletha Grayer follows.  - s/p Sinus node arrest, "Atrially dependent" and with evidence of some AV node conduction abnormalities and intraventricular conduction abnormalities.  - Continue BB in setting of h/o of Ao dissection.  - On 11/03/18, noted to have 11.9% AS-VP, and 71.7% AP-VP. AV delay increased that day to allow for less ventricular pacing.  - Today, pt pacing approx 29%. MVP turned on by [facility] with [employer] with noticeable decrease in V-pacing. Continue to follow.  5. AFlutter:  - A pacing today. Not in Afib by ICD interrogation (All done in person with rep, She has RA and RV lead) - She denies any episodes in the last 3 or 4 years.   - Continue Xarelto 20 mg daily. Can discuss further with Dr. Loletha Grayer. Could consider switch to Eliquis. - This patients CHA2DS2-VASc 6 6. Hx repair of Type A Ao dissection with concerns for worsening type B dissection - She has yearly follow-up with Dr. Servando Snare.  - Admitted to [facility] from outside hospital earlier this month with sudden chest pain and CT (Outside hospital) concerning for worsening type-B dissection extending from just distal to the L subclavian artery down into the L CIA.  Cardiac surgery consulted at [facility] and no surgery recommended at that time.   - She can  follow up further with Dr. Servando Snare.  7. HTN:  - Meds as above.  8. HLD  - LDL at target per Dr. Sallyanne Kuster.  9. DM:  - Per PCP.  - On Januvia and glipizide. - Need to consider Jardiance/Farxiga 10. Obesity:  - Body mass index is 39.45 kg/m.   Most likely EF has dropped due to increase RV pacing (Was 30% at visit with Dr. Sallyanne Kuster in July, but was pacing nearly 100% in July) vs Takotsubo CMP. Med adjustments as above.   Shirley Friar, PA-C 11/07/18   Patient seen and examined with the above-signed Advanced Practice Provider and/or Housestaff. I personally reviewed laboratory data, imaging studies and relevant notes. I independently examined the patient and formulated the important aspects of the plan. I have edited the note to reflect any of my changes or salient points. I have personally discussed the plan with the patient and/or family.  Complicated 73 y/o woman followed by Dr. Recardo Evangelist. Has h/o CAD s/p CABG and previous Type A/B  dissection with repair by Dr. Servando Snare with chdonic distal dissection. Recently was on vacation and experienced severe CP. Eventually transferred to [facility]. Troponin and ECG ok. Cath without acute issue. CT showed potential worsening of type B dissection (Ascending repair stable). Echo showed newly decreased EF to 35%. Felt to be possibly related to chronic RV pacing and device parameters adjusted. Started on Benicar 20.   Here for HF Consult. Feeling much better. No further CP. No active HF symptoms. Volume status looks good on exam. Having occasional orthostatic symptoms.   Etiology of LV dysfunction unclear. ? Possible stress-induced CM.   Will continue current therapy except will decrease Benicar to 10 daily. Will repeat echo 1-2 months. Have discussed case with Dr. Recardo Evangelist personally who will also see her soon.   Glori Bickers, MD  4:30 PM

## 2018-11-06 NOTE — Telephone Encounter (Signed)
Patient wanted you to know that while she was [place] the beach over [place] and was in the the ICU at [facility] from 10/29/18 and came home 11/03/18.  The first thought was another dissection---then a leaking valve.   Then it was decided her pacemaker was not working properly and the "bottom" of her heart was not pumping at all.  She has an appointment on 11/07/[facility]--she just wanted you to know because she felt you would want to see her and you might want to call her.

## 2018-11-07 ENCOUNTER — Encounter (HOSPITAL_COMMUNITY): Payer: Self-pay

## 2018-11-07 ENCOUNTER — Ambulatory Visit (HOSPITAL_COMMUNITY)
Admission: RE | Admit: 2018-11-07 | Discharge: 2018-11-07 | Disposition: A | Discharge status: Home / Self Care | Payer: Medicare Other | Source: Ambulatory Visit | Attending: Internal Medicine | Admitting: Internal Medicine

## 2018-11-07 VITALS — BP 108/52 | HR 64 | Ht 60.0 in | Wt 202.0 lb

## 2018-11-07 DIAGNOSIS — E669 Obesity, unspecified: Secondary | ICD-10-CM | POA: Insufficient documentation

## 2018-11-07 DIAGNOSIS — Z7901 Long term (current) use of anticoagulants: Secondary | ICD-10-CM | POA: Insufficient documentation

## 2018-11-07 DIAGNOSIS — K219 Gastro-esophageal reflux disease without esophagitis: Secondary | ICD-10-CM | POA: Insufficient documentation

## 2018-11-07 DIAGNOSIS — Z87442 Personal history of urinary calculi: Secondary | ICD-10-CM | POA: Diagnosis not present

## 2018-11-07 DIAGNOSIS — E1169 Type 2 diabetes mellitus with other specified complication: Secondary | ICD-10-CM | POA: Diagnosis not present

## 2018-11-07 DIAGNOSIS — F419 Anxiety disorder, unspecified: Secondary | ICD-10-CM | POA: Insufficient documentation

## 2018-11-07 DIAGNOSIS — I2510 Atherosclerotic heart disease of native coronary artery without angina pectoris: Secondary | ICD-10-CM | POA: Diagnosis not present

## 2018-11-07 DIAGNOSIS — I4892 Unspecified atrial flutter: Secondary | ICD-10-CM | POA: Diagnosis not present

## 2018-11-07 DIAGNOSIS — I5032 Chronic diastolic (congestive) heart failure: Secondary | ICD-10-CM | POA: Diagnosis present

## 2018-11-07 DIAGNOSIS — I350 Nonrheumatic aortic (valve) stenosis: Secondary | ICD-10-CM

## 2018-11-07 DIAGNOSIS — I5022 Chronic systolic (congestive) heart failure: Secondary | ICD-10-CM | POA: Insufficient documentation

## 2018-11-07 DIAGNOSIS — I7101 Dissection of thoracic aorta: Secondary | ICD-10-CM | POA: Diagnosis not present

## 2018-11-07 DIAGNOSIS — Z79899 Other long term (current) drug therapy: Secondary | ICD-10-CM | POA: Diagnosis not present

## 2018-11-07 DIAGNOSIS — Z841 Family history of disorders of kidney and ureter: Secondary | ICD-10-CM | POA: Diagnosis not present

## 2018-11-07 DIAGNOSIS — Z6839 Body mass index (BMI) 39.0-39.9, adult: Secondary | ICD-10-CM | POA: Insufficient documentation

## 2018-11-07 DIAGNOSIS — I110 Hypertensive heart disease with heart failure: Secondary | ICD-10-CM | POA: Insufficient documentation

## 2018-11-07 DIAGNOSIS — Z950 Presence of cardiac pacemaker: Secondary | ICD-10-CM | POA: Insufficient documentation

## 2018-11-07 DIAGNOSIS — Z7951 Long term (current) use of inhaled steroids: Secondary | ICD-10-CM | POA: Diagnosis not present

## 2018-11-07 DIAGNOSIS — E119 Type 2 diabetes mellitus without complications: Secondary | ICD-10-CM | POA: Diagnosis not present

## 2018-11-07 DIAGNOSIS — Z7984 Long term (current) use of oral hypoglycemic drugs: Secondary | ICD-10-CM | POA: Diagnosis not present

## 2018-11-07 DIAGNOSIS — E785 Hyperlipidemia, unspecified: Secondary | ICD-10-CM | POA: Diagnosis not present

## 2018-11-07 DIAGNOSIS — Z882 Allergy status to sulfonamides status: Secondary | ICD-10-CM | POA: Insufficient documentation

## 2018-11-07 DIAGNOSIS — Z87891 Personal history of nicotine dependence: Secondary | ICD-10-CM | POA: Diagnosis not present

## 2018-11-07 DIAGNOSIS — Z885 Allergy status to narcotic agent status: Secondary | ICD-10-CM | POA: Insufficient documentation

## 2018-11-07 DIAGNOSIS — I495 Sick sinus syndrome: Secondary | ICD-10-CM | POA: Insufficient documentation

## 2018-11-07 DIAGNOSIS — I272 Pulmonary hypertension, unspecified: Secondary | ICD-10-CM | POA: Insufficient documentation

## 2018-11-07 DIAGNOSIS — Z951 Presence of aortocoronary bypass graft: Secondary | ICD-10-CM | POA: Insufficient documentation

## 2018-11-07 DIAGNOSIS — Z8249 Family history of ischemic heart disease and other diseases of the circulatory system: Secondary | ICD-10-CM | POA: Insufficient documentation

## 2018-11-07 DIAGNOSIS — I71019 Dissection of thoracic aorta, unspecified: Secondary | ICD-10-CM

## 2018-11-07 DIAGNOSIS — I10 Essential (primary) hypertension: Secondary | ICD-10-CM | POA: Diagnosis not present

## 2018-11-07 MED ORDER — OLMESARTAN MEDOXOMIL 20 MG PO TABS: 10.0000 mg | ORAL_TABLET | Freq: Every day | ORAL | 3 refills | Status: DC

## 2018-11-07 NOTE — Telephone Encounter (Signed)
Left message for pt to call, Follow up scheduled 12-06-18.

## 2018-11-07 NOTE — Patient Instructions (Signed)
DECREASE Benicar 10mg  (0.5 tab) daily  Your physician has requested that you have an echocardiogram. Echocardiography is a painless test that uses sound waves to create images of your heart. It provides your doctor with information about the size and shape of your heart and how well your heart's chambers and valves are working. This procedure takes approximately one hour. There are no restrictions for this procedure.  Follow up with Dr. Haroldine Laws in 2 months

## 2018-11-07 NOTE — Telephone Encounter (Signed)
We spoke at length. Will wait to see Dr. Clayborne Dana note from today, but I would like to see her for a pacer check in a couple of months, please. Preferably a Wednesday, but not imperative. MCr

## 2018-11-08 NOTE — Addendum Note (Signed)
Encounter addended by: Jolaine Artist, MD on: 11/08/2018 4:32 PM  Actions taken: Sign clinical note

## 2018-11-08 NOTE — Telephone Encounter (Signed)
Spoke with pt, aware of appointment and date and time are okay with her.

## 2018-11-09 DIAGNOSIS — I7101 Dissection of thoracic aorta: Secondary | ICD-10-CM | POA: Diagnosis not present

## 2018-11-09 DIAGNOSIS — I350 Nonrheumatic aortic (valve) stenosis: Secondary | ICD-10-CM | POA: Diagnosis not present

## 2018-11-09 DIAGNOSIS — I509 Heart failure, unspecified: Secondary | ICD-10-CM | POA: Diagnosis not present

## 2018-11-09 DIAGNOSIS — I10 Essential (primary) hypertension: Secondary | ICD-10-CM | POA: Diagnosis not present

## 2018-11-12 ENCOUNTER — Other Ambulatory Visit: Payer: Self-pay | Admitting: Cardiovascular Disease

## 2018-11-12 ENCOUNTER — Other Ambulatory Visit: Payer: Self-pay | Admitting: Internal Medicine

## 2018-11-12 DIAGNOSIS — E1151 Type 2 diabetes mellitus with diabetic peripheral angiopathy without gangrene: Secondary | ICD-10-CM

## 2018-11-12 DIAGNOSIS — IMO0002 Reserved for concepts with insufficient information to code with codable children: Secondary | ICD-10-CM

## 2018-11-12 DIAGNOSIS — E1165 Type 2 diabetes mellitus with hyperglycemia: Principal | ICD-10-CM

## 2018-11-13 ENCOUNTER — Other Ambulatory Visit: Payer: Self-pay | Admitting: Family Medicine

## 2018-11-13 ENCOUNTER — Telehealth: Payer: Self-pay | Admitting: Family Medicine

## 2018-11-13 ENCOUNTER — Encounter: Payer: Self-pay | Admitting: Family Medicine

## 2018-11-13 ENCOUNTER — Telehealth: Payer: Self-pay | Admitting: Cardiovascular Disease

## 2018-11-13 ENCOUNTER — Ambulatory Visit (INDEPENDENT_AMBULATORY_CARE_PROVIDER_SITE_OTHER): Payer: Medicare Other | Admitting: Family Medicine

## 2018-11-13 VITALS — BP 126/36 | HR 55 | Temp 98.1°F | Resp 16 | Ht 60.0 in | Wt 197.0 lb

## 2018-11-13 DIAGNOSIS — F410 Panic disorder [episodic paroxysmal anxiety] without agoraphobia: Secondary | ICD-10-CM | POA: Insufficient documentation

## 2018-11-13 DIAGNOSIS — F419 Anxiety disorder, unspecified: Secondary | ICD-10-CM | POA: Diagnosis not present

## 2018-11-13 DIAGNOSIS — I7101 Dissection of thoracic aorta: Secondary | ICD-10-CM

## 2018-11-13 DIAGNOSIS — E785 Hyperlipidemia, unspecified: Secondary | ICD-10-CM

## 2018-11-13 DIAGNOSIS — I71019 Dissection of thoracic aorta, unspecified: Secondary | ICD-10-CM

## 2018-11-13 MED ORDER — CITALOPRAM HYDROBROMIDE 20 MG PO TABS: ORAL_TABLET | ORAL | 3 refills | Status: DC

## 2018-11-13 NOTE — Assessment & Plan Note (Signed)
Dissecting aneurysm--  Seen by cardio  Pt states benicar makes her feel terrible cma spoke to nurse in [place] Pt will stop benicar and see pharm in [place] tomorrow

## 2018-11-13 NOTE — Assessment & Plan Note (Signed)
Inc celexa to 60 mg daily F/u 1 month or sooner prn

## 2018-11-13 NOTE — Telephone Encounter (Signed)
Called patient, per last note from PCP her BP today at office visit was 126/36; she would like to know how to continue her Benicar. BP was checked by automatic machine at PCP, and not rechecked after visit was completed. Patient states she feels bad, that she is very fatigued and has no energy. Patient has not taken Benicar today as of now. I advised patient I would speak with Pharmacy to get Recommendations, but we may need to have her come for a BP check, and to hold Benicar as of now.

## 2018-11-13 NOTE — Telephone Encounter (Signed)
Copied from [place] [id number]. Topic: Quick Communication - See Telephone Encounter >> Nov 13, 2018  4:02 PM Vernona Rieger wrote: CRM for notification. See Telephone encounter for: 11/13/18.  Almyra Free with [facility] heartcare called and said that the patient contacted them today about her blood pressure being 126/36 when she was in the office today with Dr Carollee Herter. Patient stated to them she seen it on her AVS and is wanting to know how to proceed with her Benicar. Almyra Free would like to speak with a nurse before the office closes @ [phone]

## 2018-11-13 NOTE — Telephone Encounter (Signed)
Spoke with Almyra Free.  She stated that they advised patient to hold off on Benicar and they are going to get her in with the pharmacist tomorrow to recheck her BP.

## 2018-11-13 NOTE — Telephone Encounter (Signed)
Patient states she went to see her PCP today and her blood pressure was 126/33.  She was told to ask if she should continue her Benicar.  Please advise.

## 2018-11-13 NOTE — Assessment & Plan Note (Signed)
Pt is on xanax 0.5 mg tid  And she is taking it regularly  While I prefer she did not need it so much--- right now its needed. On occasion she can take 1 mg but no more than 1x a day If she feels this is not working we may need to refer her to psych-- she understands this

## 2018-11-13 NOTE — Progress Notes (Signed)
Patient ID: [name], female    DOB: 1946-03-19  Age: 72 y.o. MRN: [id number]    Subjective:  Subjective  HPI [name] presents [facility] for [facility]secting Aorta aneurysm but they did not feel it needed to be repaired for a few months per pt.  She is seeing cardiology   She was started on benicar in hosp and told not to stop it.  They warned her it would make her feel terrible and it does.   She is having increased panic attacks and anxiety.  The hospital wanted her xanax inc but she is taking 0.5 mg tid regularly Review of Systems  Constitutional: Positive for appetite change and unexpected weight change. Negative for diaphoresis and fatigue.  Eyes: Negative for pain, redness and visual disturbance.  Respiratory: Negative for cough, chest tightness, shortness of breath and wheezing.   Cardiovascular: Negative for chest pain, palpitations and leg swelling.  Endocrine: Negative for cold intolerance, heat intolerance, polydipsia, polyphagia and polyuria.  Genitourinary: Negative for difficulty urinating, dysuria and frequency.  Neurological: Negative for dizziness, light-headedness, numbness and headaches.    History Past Medical History:  Diagnosis Date  . Acute thoracic aortic dissection [facility]) 2008   emergency surgery - [facility]  . Anxiety   . Cancer ([place])    skin  . Diabetes mellitus   . Diverticulosis   . GERD (gastroesophageal reflux disease)   . Hemorrhoids   . Hyperlipidemia   . Hypertension   . Kidney stones   . Sinus node dysfunction (HCC)    hx of PPM    She has a past surgical history that includes Pacemaker insertion (08/2006); Appendectomy; Abdominal hysterectomy (1984); Thoracic aortic aneurysm repair (2000); Repair of acute ascending thoracic aortic dissection (2008); transthoracic echocardiogram (09/20/2012); Pacemaker generator change (2014); Eye surgery (2014); and pacemaker generator change (N/A, 08/29/2013).   Her family history includes  Heart attack in her father and maternal grandmother; Kidney disease in her mother; Valvular heart disease in her son.She reports that she quit smoking about 25 years ago. She has never used smokeless tobacco. She reports that she does not drink alcohol or use drugs.  Current Outpatient Medications on File Prior to Visit  Medication Sig Dispense Refill  . ALPRAZolam (XANAX) 1 MG tablet Take 0.5 tablets (0.5 mg total) by mouth 3 (three) times daily as needed for anxiety. 30 tablet 0  . carvedilol (COREG) 25 MG tablet TAKE 1 TABLET BY MOUTH TWICE A DAY WITH A MEAL 180 tablet 3  . cholecalciferol (VITAMIN D) 1000 UNITS tablet Take 1,000 Units by mouth daily.    . citalopram (CELEXA) 40 MG tablet TAKE 1 TABLET BY MOUTH EVERY DAY 90 tablet 0  . fenofibrate 160 MG tablet TAKE 1 TABLET BY MOUTH EVERY DAY 90 tablet 1  . fluticasone (FLONASE) 50 MCG/ACT nasal spray USE 2 SPRAYS IN EACH NOSTIRL EVERY DAY 16 g 1  . furosemide (LASIX) 40 MG tablet TAKE 1 TABLET BY MOUTH EVERY DAY 90 tablet 1  . glipiZIDE (GLUCOTROL XL) 2.5 MG 24 hr tablet TAKE 1 TABLET (2.5 MG TOTAL) BY MOUTH DAILY BEFORE BREAKFAST. 90 tablet 1  . hyoscyamine (LEVSIN SL) 0.125 MG SL tablet TAKE 1 TABLET BY MOUTH TWICE A DAY AS NEEDED FOR CRAMPING OR DIARRHEA OR LOOSE STOOLS 60 tablet 0  . JANUVIA 100 MG tablet TAKE 1 TABLET BY MOUTH EVERY DAY 30 tablet 2  . lansoprazole (PREVACID) 15 MG capsule Take 15 mg by mouth as  needed (for heart burn).     . magnesium oxide (MAG-OX) 400 MG tablet Take 400 mg by mouth daily.    [name] olmesartan (BENICAR) 20 MG tablet Take 0.5 tablets (10 mg total) by mouth daily. 15 tablet 3  . Omega-3 Fatty Acids (FISH OIL) 1000 MG CAPS Take by mouth.    Vladimir Faster Glycol-Propyl Glycol (SYSTANE OP) Apply 1 drop to eye 4 (four) times daily - after meals and at bedtime.    . Probiotic Product (PRO-BIOTIC BLEND PO) Take 1 tablet by mouth as needed.     . rosuvastatin (CRESTOR) 20 MG tablet TAKE 1 TABLET BY MOUTH DAILY (needs  ov and REPEAT LABS ARE DUE NOW) 90 tablet 0  . XARELTO 20 MG TABS tablet TAKE 1 TABLET BY MOUTH EVERY DAY WITH SUPPER 30 tablet 5   No current facility-administered medications on file prior to visit.      Objective:  Objective  Physical Exam Vitals signs and nursing note reviewed.  Constitutional:      Appearance: She is well-developed.  HENT:     Head: Normocephalic and atraumatic.  Eyes:     Conjunctiva/sclera: Conjunctivae normal.  Neck:     Musculoskeletal: Normal range of motion and neck supple.     Thyroid: No thyromegaly.     Vascular: No carotid bruit or JVD.  Cardiovascular:     Rate and Rhythm: Normal rate and regular rhythm.     Heart sounds: Murmur present.  Pulmonary:     Effort: Pulmonary effort is normal. No respiratory distress.     Breath sounds: Normal breath sounds. No wheezing or rales.  Chest:     Chest wall: No tenderness.  Neurological:     Mental Status: She is alert and oriented to person, place, and time.  Psychiatric:        Attention and Perception: Attention and perception normal.        Mood and Affect: Mood is anxious. Affect is tearful.        Speech: Speech normal.        Behavior: Behavior normal.        Thought Content: Thought content is not paranoid or delusional. Thought content does not include homicidal or suicidal ideation. Thought content does not include homicidal or suicidal plan.    BP (!) 126/36 (BP Location: Right Arm, Cuff Size: Large)   Pulse (!) 55   Temp 98.1 F (36.7 C) (Oral)   Resp 16   Ht 5' (1.524 m)   Wt 197 lb (89.4 kg)   SpO2 97%   BMI 38.47 kg/m  Wt Readings from Last 3 Encounters:  11/13/18 197 lb (89.4 kg)  11/07/18 202 lb (91.6 kg)  06/28/18 210 lb 12.8 oz (95.6 kg)     Lab Results  Component Value Date   WBC 6.0 05/05/2018   HGB 13.2 05/05/2018   HCT 39.6 05/05/2018   PLT 99 (L) 05/05/2018   GLUCOSE 116 (H) 05/05/2018   CHOL 143 06/28/2017   TRIG 107.0 06/28/2017   HDL 51.40 06/28/2017    LDLDIRECT 217.7 09/13/2011   LDLCALC 70 06/28/2017   ALT 19 05/05/2018   AST 28 05/05/2018   NA 140 05/05/2018   K 4.3 05/05/2018   CL 107 05/05/2018   CREATININE 1.11 (H) 05/05/2018   BUN 15 05/05/2018   CO2 25 05/05/2018   TSH 0.986 08/22/2013   INR 1.27 05/21/2016   HGBA1C 6.8 (A) 06/28/2018   MICROALBUR 1.6 01/21/2014  No results found.   Assessment & Plan:  Plan  I am having [name] start on citalopram. I am also having her maintain her lansoprazole, cholecalciferol, Probiotic Product (PRO-BIOTIC BLEND PO), Polyethyl Glycol-Propyl Glycol (SYSTANE OP), hyoscyamine, citalopram, fluticasone, glipiZIDE, carvedilol, rosuvastatin, ALPRAZolam, furosemide, magnesium oxide, Fish Oil, olmesartan, JANUVIA, and fenofibrate.  Meds ordered this encounter  Medications  . citalopram (CELEXA) 20 MG tablet    Sig: 3 po qd    Dispense:  90 tablet    Refill:  3    Problem List Items Addressed This Visit      Unprioritized   Anxiety - Primary    Inc celexa to 60 mg daily F/u 1 month or sooner prn       Relevant Medications   citalopram (CELEXA) 20 MG tablet   Panic attack    Pt is on xanax 0.5 mg tid  And she is taking it regularly  While I prefer she did not need it so much--- right now its needed. On occasion she can take 1 mg but no more than 1x a day If she feels this is not working we may need to refer her to psych-- she understands this      Relevant Medications   citalopram (CELEXA) 20 MG tablet   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, status post graft repair the ascending aorta    Dissecting aneurysm--  Seen by cardio  Pt states benicar makes her feel terrible cma spoke to nurse in [facility] Pt will stop benicar and see pharm in [facility] tomorrow          Follow-up: Return in about 3 months (around 02/12/2019), or if symptoms worsen or fail to improve, for anxiety / panic attacks.  [name],  DO

## 2018-11-13 NOTE — Telephone Encounter (Signed)
Patient reports "feeling bad" and increased fatigue. BP 126/36 at PCP office prior to daily dose of Benicar.   Appt given for HTN clinic on 12/17 at 8am

## 2018-11-13 NOTE — Patient Instructions (Signed)
Panic Attack A panic attack is a sudden episode of severe anxiety, fear, or discomfort that causes physical and emotional symptoms. The attack may be in response to something frightening, or it may occur for no known reason. Symptoms of a panic attack can be similar to symptoms of a heart attack or stroke. It is important to see your health care provider when you have a panic attack so that these conditions can be ruled out. A panic attack is a symptom of another condition. Most panic attacks go away with treatment of the underlying problem. If you have panic attacks often, you may have a condition called panic disorder. What are the causes? A panic attack may be caused by:  An extreme, life-threatening situation, such as a war or natural disaster.  An anxiety disorder, such as post-traumatic stress disorder.  Depression.  Certain medical conditions, including heart problems, neurological conditions, and infections.  Certain over-the-counter and prescription medicines.  Illegal drugs that increase heart rate and blood pressure, such as methamphetamine.  Alcohol.  Supplements that increase anxiety.  Panic disorder.  What increases the risk? You are more likely to develop this condition if:  You have an anxiety disorder.  You have another mental health condition.  You take certain medicines.  You use alcohol, illegal drugs, or other substances.  You are under extreme stress.  A life event is causing increased feelings of anxiety and depression.  What are the signs or symptoms? A panic attack starts suddenly, usually lasts about 20 minutes, and occurs with one or more of the following:  A pounding heart.  A feeling that your heart is beating irregularly or faster than normal (palpitations).  Sweating.  Trembling or shaking.  Shortness of breath or feeling smothered.  Feeling choked.  Chest pain or discomfort.  Nausea or a strange feeling in your  stomach.  Dizziness, feeling lightheaded, or feeling like you might faint.  Chills or hot flashes.  Numbness or tingling in your lips, hands, or feet.  Feeling confused, or feeling that you are not yourself.  Fear of losing control or being emotionally unstable.  Fear of dying.  How is this diagnosed? A panic attack is diagnosed with an assessment by your health care provider. During the assessment your health care provider will ask questions about:  Your history of anxiety, depression, and panic attacks.  Your medical history.  Whether you drink alcohol, use illegal drugs, take supplements, or take medicines. Be honest about your substance use.  Your health care provider may also:  Order blood tests or other kinds of tests to rule out serious medical conditions.  Refer you to a mental health professional for further evaluation.  How is this treated? Treatment depends on the cause of the panic attack:  If the cause is a medical problem, your health care provider will either treat that problem or refer you to a specialist.  If the cause is emotional, you may be given anti-anxiety medicines or referred to a counselor. These medicines may reduce how often attacks happen, reduce how severe the attacks are, and lower anxiety.  If the cause is a medicine, your health care provider may tell you to stop the medicine, change your dose, or take a different medicine.  If the cause is a drug, treatment may involve letting the drug wear off and taking medicine to help the drug leave your body or to counteract its effects. Attacks caused by drug abuse may continue even if you stop using   the drug.  Follow these instructions at home:  Take over-the-counter and prescription medicines only as told by your health care provider.  If you feel anxious, limit your caffeine intake.  Take good care of your physical and mental health by: ? Eating a balanced diet that includes plenty of fresh  fruits and vegetables, whole grains, lean meats, and low-fat dairy. ? Getting plenty of rest. Try to get 7-8 hours of uninterrupted sleep each night. ? Exercising regularly. Try to get 30 minutes of physical activity at least 5 days a week. ? Not smoking. Talk to your health care provider if you need help quitting. ? Limiting alcohol intake to no more than 1 drink a day for nonpregnant women and 2 drinks a day for men. One drink equals 12 oz of beer, 5 oz of wine, or 1 oz of hard liquor.  Keep all follow-up visits as told by your health care provider. This is important. Panic attacks may have underlying physical or emotional problems that take time to accurately diagnose. Contact a health care provider if:  Your symptoms do not improve, or they get worse.  You are not able to take your medicine as prescribed because of side effects. Get help right away if:  You have serious thoughts about hurting yourself or others.  You have symptoms of a panic attack. Do not drive yourself to the hospital. Have someone else drive you or call an ambulance. If you ever feel like you may hurt yourself or others, or you have thoughts about taking your own life, get help right away. You can go to your nearest emergency department or call:  Your local emergency services (911 in the [place]  A suicide crisis helpline, such as the National Suicide Prevention Lifeline at 1-800-273-8255. This is open 24 hours a day.  Summary  A panic attack is a sign of a serious health or mental health condition. Get help right away. Do not drive yourself to the hospital. Have someone else drive you or call an ambulance.  Always see a health care provider to have the reasons for the panic attack correctly diagnosed.  If your panic attack was caused by a physical problem, follow your health care provider's suggestions for medicine, referral to a specialist, and lifestyle changes.  If your panic attack was caused by an  emotional problem, follow through with counseling from a qualified mental health specialist.  If you feel like you may hurt yourself or others, call 911 and get help right away. This information is not intended to replace advice given to you by your health care provider. Make sure you discuss any questions you have with your health care provider. Document Released: 11/15/2005 Document Revised: 12/24/2016 Document Reviewed: 12/24/2016 Elsevier Interactive Patient Education  2018 Elsevier Inc.  

## 2018-11-14 ENCOUNTER — Ambulatory Visit (INDEPENDENT_AMBULATORY_CARE_PROVIDER_SITE_OTHER): Payer: Medicare Other | Admitting: Pharmacist

## 2018-11-14 VITALS — BP 108/58 | HR 59 | Resp 16

## 2018-11-14 DIAGNOSIS — I10 Essential (primary) hypertension: Secondary | ICD-10-CM

## 2018-11-14 NOTE — Patient Instructions (Addendum)
REMAIN off Benicar   Continue all other medications as prescribed.   Return for a follow up appointment as scheduled with Dr. Loletha Grayer  Your blood pressure target is less than 130/80 but above 100/50  Check your blood pressure at home daily (if able) and keep record of the readings.  Bring all of your meds, your BP cuff and your record of home blood pressures to your next appointment.  Exercise as you're able, try to walk approximately 30 minutes per day.  Keep salt intake to a minimum, especially watch canned and prepared boxed foods.  Eat more fresh fruits and vegetables and fewer canned items.  Avoid eating in fast food restaurants.    HOW TO TAKE YOUR BLOOD PRESSURE: . Rest 5 minutes before taking your blood pressure. .  Don't smoke or drink caffeinated beverages for at least 30 minutes before. . Take your blood pressure before (not after) you eat. . Sit comfortably with your back supported and both feet on the floor (don't cross your legs). . Elevate your arm to heart level on a table or a desk. . Use the proper sized cuff. It should fit smoothly and snugly around your bare upper arm. There should be enough room to slip a fingertip under the cuff. The bottom edge of the cuff should be 1 inch above the crease of the elbow. . Ideally, take 3 measurements at one sitting and record the average.

## 2018-11-14 NOTE — Progress Notes (Signed)
Patient ID: [name]                 DOB: [birth year]                      MRN: [id number]     HPI: [name] is a 72 y.o. female patient of Dr. Sallyanne Kuster who presents today for hypertension evaluation. PMH significant for repaired type A aortic dissection (resuspended native aortic valve), sinus node dysfunction with sinus node arrest, "atrially-pacemaker dependent" status post pacemaker implantation, morbid obesity, diabetes mellitus, hypertension. With recent admission for CHF. Her Benicar was recently decreased due to lightheadness, but she was not orthostatic on exam by Dr. Haroldine Laws. She saw primary care yesterday who felt pressures were also too low and told her to discuss with cardiology stopping Benicar.   She presents today for additional medication management. She states that she was in the ICU at [facility] for 6 days around Thanksgiving. She had three panic attacks leading up to this admission [place] she was started on Benicar for elevated blood pressures. She states that she has never felt so poorly than since she has been home. She has had a poor appetite and no energy since being home from the hospital. She has felt miserable.   Current HTN meds:  Carvedilol 25mg  BID Furosemide 40mg  daily  olmesartan 10mg  daily   Previously tried:   BP goal: <130/80  Family History: Heart attack in her father and maternal grandmother; Kidney disease in her mother; Valvular heart disease in her son.  Social History: former smoker, denies alcohol  Diet: she has been following a low sodium diet  Exercise: limited due to recent lack of energy  Home BP readings: has not been checking  Wt Readings from Last 3 Encounters:  11/13/18 197 lb (89.4 kg)  11/07/18 202 lb (91.6 kg)  06/28/18 210 lb 12.8 oz (95.6 kg)   BP Readings from Last 3 Encounters:  11/14/18 (!) 108/58  11/13/18 (!) 126/36  11/07/18 (!) 108/52    Pulse Readings from Last 3 Encounters:  11/14/18 (!) 59  11/13/18 (!) 55  11/07/18 64    Renal function: CrCl cannot be calculated (Patient's most recent lab result is older than the maximum 21 days allowed.).  Past Medical History:  Diagnosis Date  . Acute thoracic aortic dissection [facility]) 2008   emergency surgery - [facility]  . Anxiety   . Cancer ([place])    skin  . Diabetes mellitus   . Diverticulosis   . GERD (gastroesophageal reflux disease)   . Hemorrhoids   . Hyperlipidemia   . Hypertension   . Kidney stones   . Sinus node dysfunction (HCC)    hx of PPM    Current Outpatient Medications on File Prior to Visit  Medication Sig Dispense Refill  . ALPRAZolam (XANAX) 1 MG tablet Take 0.5 tablets (0.5 mg total) by mouth 3 (three) times daily as needed for anxiety. 30 tablet 0  . carvedilol (COREG) 25 MG tablet TAKE 1 TABLET BY MOUTH TWICE A DAY WITH A MEAL 180 tablet 3  . cholecalciferol (VITAMIN D) 1000 UNITS tablet Take 1,000 Units by mouth daily.    . citalopram (CELEXA) 40 MG tablet TAKE 1 TABLET BY MOUTH EVERY DAY 90 tablet 0  . fenofibrate 160 MG tablet TAKE 1 TABLET BY MOUTH EVERY DAY 90 tablet 1  . fluticasone (FLONASE) 50 MCG/ACT nasal  spray USE 2 SPRAYS IN EACH NOSTIRL EVERY DAY 16 g 1  . furosemide (LASIX) 40 MG tablet TAKE 1 TABLET BY MOUTH EVERY DAY 90 tablet 1  . glipiZIDE (GLUCOTROL XL) 2.5 MG 24 hr tablet TAKE 1 TABLET (2.5 MG TOTAL) BY MOUTH DAILY BEFORE BREAKFAST. 90 tablet 1  . hyoscyamine (LEVSIN SL) 0.125 MG SL tablet TAKE 1 TABLET BY MOUTH TWICE A DAY AS NEEDED FOR CRAMPING OR DIARRHEA OR LOOSE STOOLS 60 tablet 0  . JANUVIA 100 MG tablet TAKE 1 TABLET BY MOUTH EVERY DAY 30 tablet 2  . lansoprazole (PREVACID) 15 MG capsule Take 15 mg by mouth as needed (for heart burn).     . magnesium oxide (MAG-OX) 400 MG tablet Take 400 mg by mouth daily.    . Omega-3 Fatty Acids (FISH OIL) 1000 MG CAPS Take by mouth.    . Probiotic Product (PRO-BIOTIC BLEND PO) Take  1 tablet by mouth as needed.     . rosuvastatin (CRESTOR) 20 MG tablet TAKE 1 TABLET BY MOUTH DAILY (needs ov and REPEAT LABS ARE DUE NOW) 90 tablet 0  . XARELTO 20 MG TABS tablet TAKE 1 TABLET BY MOUTH EVERY DAY WITH SUPPER 30 tablet 5  . citalopram (CELEXA) 20 MG tablet 3 po qd (Patient not taking: Reported on 11/14/2018) 90 tablet 3  . Polyethyl Glycol-Propyl Glycol (SYSTANE OP) Apply 1 drop to eye 4 (four) times daily - after meals and at bedtime.     No current facility-administered medications on file prior to visit.     Allergies  Allergen Reactions  . Oxycodone Itching  . Vicodin [Hydrocodone-Acetaminophen] Itching  . Neomycin-Bacitracin Zn-Polymyx Other (See Comments)    Red , swollen eye  . Sulfonamide Derivatives Other (See Comments)    Just had reaction when taking during pregnancy- last child birth 10    Blood pressure (!) 108/58, pulse (!) 59, resp. rate 16.   Assessment/Plan: Hypertension: BP today is well below target. She is feeling poorly on Benicar. Will stop for now. Discussed that may need to add another ARB back down the line for cardiovascular health, but would lean toward a less potent ARB that has evidence in HF (losartan). Will continue carvedilol 25mg  BID and furosemide 40mg  daily. Discussed at length importance of tight blood pressure control, but preventing pressures running too low. Advised that she monitor pressures daily after doses of carvedilol and furosemide and bring log to follow up with Dr. Loletha Grayer in 2 weeks. She can follow up in HTN clinic after if needed for additional medication management.    Thank you, Lelan Pons. Patterson Hammersmith, [facility]  11/14/2018 9:06 AM

## 2018-11-14 NOTE — Progress Notes (Signed)
Thanks

## 2018-11-16 ENCOUNTER — Other Ambulatory Visit: Payer: Self-pay | Admitting: Family Medicine

## 2018-11-16 DIAGNOSIS — F418 Other specified anxiety disorders: Secondary | ICD-10-CM

## 2018-11-17 DIAGNOSIS — I7101 Dissection of thoracic aorta: Secondary | ICD-10-CM | POA: Diagnosis not present

## 2018-11-17 DIAGNOSIS — I509 Heart failure, unspecified: Secondary | ICD-10-CM | POA: Diagnosis not present

## 2018-11-17 DIAGNOSIS — I10 Essential (primary) hypertension: Secondary | ICD-10-CM | POA: Diagnosis not present

## 2018-11-17 DIAGNOSIS — I350 Nonrheumatic aortic (valve) stenosis: Secondary | ICD-10-CM | POA: Diagnosis not present

## 2018-11-20 NOTE — Telephone Encounter (Signed)
Refill request for alprazolam.   Last OV: 11/13/2018 Last Fill: 10/25/2018 #30 and 0RF UDS: 10/27/2017

## 2018-12-05 ENCOUNTER — Other Ambulatory Visit: Payer: Self-pay | Admitting: Family Medicine

## 2018-12-05 DIAGNOSIS — F419 Anxiety disorder, unspecified: Secondary | ICD-10-CM

## 2018-12-06 ENCOUNTER — Encounter: Payer: Self-pay | Admitting: Cardiovascular Disease

## 2018-12-06 ENCOUNTER — Ambulatory Visit (INDEPENDENT_AMBULATORY_CARE_PROVIDER_SITE_OTHER): Payer: Medicare Other | Admitting: Cardiovascular Disease

## 2018-12-06 VITALS — BP 118/84 | HR 55 | Ht 60.0 in | Wt 197.2 lb

## 2018-12-06 DIAGNOSIS — Z950 Presence of cardiac pacemaker: Secondary | ICD-10-CM | POA: Diagnosis not present

## 2018-12-06 DIAGNOSIS — I495 Sick sinus syndrome: Secondary | ICD-10-CM

## 2018-12-06 DIAGNOSIS — I350 Nonrheumatic aortic (valve) stenosis: Secondary | ICD-10-CM

## 2018-12-06 DIAGNOSIS — Z6835 Body mass index (BMI) 35.0-35.9, adult: Secondary | ICD-10-CM

## 2018-12-06 DIAGNOSIS — E669 Obesity, unspecified: Secondary | ICD-10-CM | POA: Diagnosis not present

## 2018-12-06 DIAGNOSIS — E1169 Type 2 diabetes mellitus with other specified complication: Secondary | ICD-10-CM

## 2018-12-06 DIAGNOSIS — I4892 Unspecified atrial flutter: Secondary | ICD-10-CM | POA: Diagnosis not present

## 2018-12-06 DIAGNOSIS — I71019 Dissection of thoracic aorta, unspecified: Secondary | ICD-10-CM

## 2018-12-06 DIAGNOSIS — E7800 Pure hypercholesterolemia, unspecified: Secondary | ICD-10-CM

## 2018-12-06 DIAGNOSIS — I7101 Dissection of thoracic aorta: Secondary | ICD-10-CM | POA: Diagnosis not present

## 2018-12-06 DIAGNOSIS — I10 Essential (primary) hypertension: Secondary | ICD-10-CM | POA: Diagnosis not present

## 2018-12-06 DIAGNOSIS — E119 Type 2 diabetes mellitus without complications: Secondary | ICD-10-CM

## 2018-12-06 NOTE — Patient Instructions (Addendum)
Medication Instructions:  Dr Sallyanne Kuster recommends that you continue on your current medications as directed. Please refer to the Current Medication list given to you today.  If you need a refill on your cardiac medications before your next appointment, please call your pharmacy.   Testing/Procedures: Remote monitoring is used to monitor your Pacemaker or ICD from home. This monitoring reduces the number of office visits required to check your device to one time per year. It allows [name] to keep an eye on the functioning of your device to ensure it is working properly. You are scheduled for a device check from home on Wednesday, January 22nd, 2020. You may send your transmission at any time that day. If you have a wireless device, the transmission will be sent automatically. After your physician reviews your transmission, you will receive a postcard with your next transmission date.  To improve our patient care and to more adequately follow your device, [facility] has decided, as a practice, to start following each patient four times a year with your home monitor. This means that you may experience a remote appointment that is close to an in-office appointment with your physician. Your insurance will apply at the same rate as other remote monitoring transmissions.  Follow-Up: Please schedule a follow-up appointment with Dr Servando Snare in May 2020.  At [facility], you and your health needs are our priority.  As part of our continuing mission to provide you with exceptional heart care, we have created designated Provider Care Teams.  These Care Teams include your primary Cardiologist (physician) and Advanced Practice Providers (APPs -  Physician Assistants and Nurse Practitioners) who all work together to provide you with the care you need, when you need it. You will need a follow up appointment in 7 months. You may see Sanda Klein, MD or one of the following Advanced Practice Providers on your  designated Care Team: [place], [place] . Fabian Sharp, PA-C . You will receive a reminder letter in the mail two months in advance. If you don't receive a letter, please call our office to schedule the follow-up appointment.

## 2018-12-06 NOTE — Progress Notes (Signed)
Cardiology Office Note    Date:  12/08/2018   ID:  [name], DOB 03/04/1946, MRN [id number]  PCP:  Carollee Herter, Alferd Apa, DO  Cardiologist:   Sanda Klein, MD   Chief Complaint  Patient presents with  . Follow-up    pt denied chest pain, pt went to the ER when she was at the beach on [place]    History of Present Illness:  [name] is a 73 y.o. female with a history of repaired type A aortic dissection (resuspended native aortic valve), sinus node dysfunction with sinus node arrest, "atrially-pacemaker dependent" status post pacemaker implantation, morbid obesity, diabetes mellitus, hypertension.   She feels much better since her last appointment, after stopping the Benicar.  She reports that she has not felt this well in many years.  She is trying to lose weight with quite a bit of success.  She has lost about 17 pounds since her peak weight in July.  Her home scale actually shows her weight to be about 3.5 pounds lower than it is in the office today.  The patient specifically denies any chest pain at rest exertion, dyspnea at rest or with exertion, orthopnea, paroxysmal nocturnal dyspnea, syncope, palpitations, focal neurological deficits, intermittent claudication, lower extremity edema, unexplained weight gain, cough, hemoptysis or wheezing.  Pacemaker interrogation shows normal device function.   Estimated generator longevity is about 10 years with current settings. Although she has intact AV conduction she has no escape rhythm when atrial pacing is taken down to 30 bpm.  She is "atrially dependent".  Heart rate histograms are little flat, but she does not have any complaints to suggest chronotropic incompetence.  Rate response has not yet turned on.  She is programmed with a lower rate limit of 55 bpm.   She has 87% atrial pacing and only 1.6% ventricular pacing.. Since her last appointment there have been no new episodes of atrial mode switch or ventricular  tachycardia. Atrial flutter was detected in 2015.  Most recent echo shows progression of aortic valve disease, now with moderate aortic stenosis.   Past Medical History:  Diagnosis Date  . Acute thoracic aortic dissection [facility]) 2008   emergency surgery - [place]  . Anxiety   . Cancer ([place])    skin  . Diabetes mellitus   . Diverticulosis   . GERD (gastroesophageal reflux disease)   . Hemorrhoids   . Hyperlipidemia   . Hypertension   . Kidney stones   . Sinus node dysfunction (HCC)    hx of PPM    Past Surgical History:  Procedure Laterality Date  . ABDOMINAL HYSTERECTOMY  1984  . APPENDECTOMY    . EYE SURGERY  2014   Rght eye  . PACEMAKER GENERATOR CHANGE  2014   Medtronic adapta  . PACEMAKER GENERATOR CHANGE N/A 08/29/2013   Procedure: PACEMAKER GENERATOR CHANGE;  Surgeon: Sanda Klein, MD;  Location: [facility]ATH [facility];  Service: Cardiovascular;  Laterality: N/A;  . PACEMAKER INSERTION  08/2006   dual chamber Medtronic EnRhythm; r/t sinus node dysfunction   . REPAIR OF ACUTE ASCENDING THORACIC AORTIC DISSECTION  2008   Dr. Servando Snare  . THORACIC AORTIC ANEURYSM REPAIR  2000   type III  . TRANSTHORACIC ECHOCARDIOGRAM  09/20/2012   EF=>55% with mild conc LVH; LA mod dilated; RA mildly dilated; mild MR/TR/AR    Current Medications: Outpatient Medications Prior to Visit  Medication Sig Dispense Refill  . ALPRAZolam (XANAX) 1 MG tablet TAKE 0.5 TABLETS (0.5  MG TOTAL) BY MOUTH 3 (THREE) TIMES DAILY AS NEEDED FOR ANXIETY. 30 tablet 0  . carvedilol (COREG) 25 MG tablet TAKE 1 TABLET BY MOUTH TWICE A DAY WITH A MEAL 180 tablet 3  . cholecalciferol (VITAMIN D) 1000 UNITS tablet Take 1,000 Units by mouth daily.    . fenofibrate 160 MG tablet TAKE 1 TABLET BY MOUTH EVERY DAY 90 tablet 1  . fluticasone (FLONASE) 50 MCG/ACT nasal spray USE 2 SPRAYS IN EACH NOSTIRL EVERY DAY 16 g 1  . furosemide (LASIX) 40 MG tablet TAKE 1 TABLET BY MOUTH EVERY DAY 90 tablet 1  . glipiZIDE (GLUCOTROL  XL) 2.5 MG 24 hr tablet TAKE 1 TABLET (2.5 MG TOTAL) BY MOUTH DAILY BEFORE BREAKFAST. 90 tablet 1  . hyoscyamine (LEVSIN SL) 0.125 MG SL tablet TAKE 1 TABLET BY MOUTH TWICE A DAY AS NEEDED FOR CRAMPING OR DIARRHEA OR LOOSE STOOLS 60 tablet 0  . JANUVIA 100 MG tablet TAKE 1 TABLET BY MOUTH EVERY DAY 30 tablet 2  . lansoprazole (PREVACID) 15 MG capsule Take 15 mg by mouth as needed (for heart burn).     . magnesium oxide (MAG-OX) 400 MG tablet Take 400 mg by mouth daily.    . Omega-3 Fatty Acids (FISH OIL) 1000 MG CAPS Take by mouth.    Vladimir Faster Glycol-Propyl Glycol (SYSTANE OP) Apply 1 drop to eye 4 (four) times daily - after meals and at bedtime.    . Probiotic Product (PRO-BIOTIC BLEND PO) Take 1 tablet by mouth as needed.     . rosuvastatin (CRESTOR) 20 MG tablet TAKE 1 TABLET BY MOUTH DAILY (needs ov and REPEAT LABS ARE DUE NOW) 90 tablet 0  . XARELTO 20 MG TABS tablet TAKE 1 TABLET BY MOUTH EVERY DAY WITH SUPPER 30 tablet 5  . citalopram (CELEXA) 20 MG tablet Take 20 mg by mouth 3 (three) times daily.    . citalopram (CELEXA) 20 MG tablet 3 po qd (Patient not taking: Reported on 11/14/2018) 90 tablet 3  . citalopram (CELEXA) 40 MG tablet TAKE 1 TABLET BY MOUTH EVERY DAY 90 tablet 0   No facility-administered medications prior to visit.      Allergies:   Oxycodone; Vicodin [hydrocodone-acetaminophen]; Neomycin-bacitracin zn-polymyx; and Sulfonamide derivatives   Social History   Socioeconomic History  . Marital status: Widowed    Spouse name: Not on file  . Number of children: 2  . Years of education: Not on file  . Highest education level: Not on file  Occupational History    Employer: RETIRED  Social Needs  . Financial resource strain: Not on file  . Food insecurity:    Worry: Not on file    Inability: Not on file  . Transportation needs:    Medical: Not on file    Non-medical: Not on file  Tobacco Use  . Smoking status: Former Smoker    Last attempt to quit: 09/04/1993      Years since quitting: 25.2  . Smokeless tobacco: Never Used  Substance and Sexual Activity  . Alcohol use: No  . Drug use: No  . Sexual activity: Not Currently  Lifestyle  . Physical activity:    Days per week: Not on file    Minutes per session: Not on file  . Stress: Not on file  Relationships  . Social connections:    Talks on phone: Not on file    Gets together: Not on file    Attends religious service: Not on file  Active member of club or organization: Not on file    Attends meetings of clubs or organizations: Not on file    Relationship status: Not on file  Other Topics Concern  . Not on file  Social History Narrative   Lives at home and husband was just moved to [place]     Family History:  The patient's family history includes Heart attack in her father and maternal grandmother; Kidney disease in her mother; Valvular heart disease in her son.   ROS:   Please see the history of present illness.    ROS all other systems are reviewed and are negative   PHYSICAL EXAM:   VS:  BP 118/84   Pulse (!) 55   Ht 5' (1.524 m)   Wt 197 lb 3.2 oz (89.4 kg)   BMI 38.51 kg/m      General: Alert, oriented x3, no distress, severely obese, well-healed sternotomy scar, healthy pacemaker site Head: no evidence of trauma, PERRL, EOMI, no exophtalmos or lid lag, no myxedema, no xanthelasma; normal ears, nose and oropharynx Neck: normal jugular venous pulsations and no hepatojugular reflux; brisk carotid pulses without delay and no carotid bruits Chest: clear to auscultation, no signs of consolidation by percussion or palpation, normal fremitus, symmetrical and full respiratory excursions Cardiovascular: normal position and quality of the apical impulse, regular rhythm, normal first and paradoxically split second heart sounds, 3/6 early to mid peaking systolic ejection murmur in the aortic focus, no diastolic murmurs, rubs or gallops Abdomen: no tenderness or distention, no  masses by palpation, no abnormal pulsatility or arterial bruits, normal bowel sounds, no hepatosplenomegaly Extremities: no clubbing, cyanosis or edema; 2+ radial, ulnar and brachial pulses bilaterally; 2+ right femoral, posterior tibial and dorsalis pedis pulses; 2+ left femoral, posterior tibial and dorsalis pedis pulses; no subclavian or femoral bruits Neurological: grossly nonfocal Psych: Normal mood and affect     Wt Readings from Last 3 Encounters:  12/06/18 197 lb 3.2 oz (89.4 kg)  11/13/18 197 lb (89.4 kg)  11/07/18 202 lb (91.6 kg)      Studies/Labs Reviewed:   EKG:  EKG is  ordered today.  Shows atrial paced, ventricular sensed rhythm with atypical left bundle branch block (LVH with QRS widening, QRS 134 ms), no acute repolarization abnormalities, QTC 466 ms  Recent Labs: 05/05/2018: ALT 19; BUN 15; Creatinine, Ser 1.11; Hemoglobin 13.2; Platelets 99; Potassium 4.3; Sodium 140   Lipid Panel    Component Value Date/Time   CHOL 143 06/28/2017 1446   TRIG 107.0 06/28/2017 1446   HDL 51.40 06/28/2017 1446   CHOLHDL 3 06/28/2017 1446   VLDL 21.4 06/28/2017 1446   LDLCALC 70 06/28/2017 1446   LDLDIRECT 217.7 09/13/2011 1535     ASSESSMENT:    1. Aortic valve stenosis, nonrheumatic   2. SSS (sick sinus syndrome) ([place])   3. Pacemaker   4. Paroxysmal atrial flutter (HCC)   5. 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, status post graft repair the ascending aorta   6. Essential hypertension   7. Hypercholesterolemia   8. Diabetes mellitus type 2 in obese ([place])   9. Severe obesity (BMI 35.0-35.9 with comorbidity) (HCC)      PLAN:  In order of problems listed above:  1. AS: Has progressed to moderate aortic stenosis, not yet symptomatic.  If she does develop severe symptomatic aortic stenosis she would not be a candidate for open valve replacement or femoral  approach due to her aortic dissection.  Consider TAVR via subclavian  approach or transapical approach. 2. SSS: At times she is clearly "Atrially dependent" and with evidence of some AV node conduction abnormalities and intraventricular conduction abnormalities, but without significant ventricular pacing at current settings.  She is not symptomatic but if he does complain of fatigue we can turn on her rate response sensor. 3. PM: Normal device function.  Continue beta-blockers even though these may increase the incidence of ventricular pacing, they are important for her aortic dissection 4. AFlutter: No episode in the last 3 or 4 years.  Nevertheless she has not really had any side effects from anticoagulation and I would recommend continuing this. 5. Hx Ao dissection: Follow-up CT shows stable findings.  Has yearly follow-up with Dr. Servando Snare 6. HTN: Good control.  Beta-blocker should always be part of her antihypertensive regimen. 7. HLP: LDL at target (less than 100 due to diabetes mellitus, no evidence of coronary or peripheral vascular disease). According to Dr. Durwin Nora Little's report, her coronaries were "spotless" at angiography in 2008. No sign of atherosclerotic calcifications seen on serial CTs of the chest and abdomen 8. DM: She reports excellent control 9. Obesity: She has made excellent progress with weight loss and is encouraged in her efforts.    Medication Adjustments/Labs and Tests Ordered: Current medicines are reviewed at length with the patient today.  Concerns regarding medicines are outlined above.  Medication changes, Labs and Tests ordered today are listed in the Patient Instructions below. Patient Instructions  Medication Instructions:  Dr Sallyanne Kuster recommends that you continue on your current medications as directed. Please refer to the Current Medication list given to you today.  If you need a refill on your cardiac medications before your next appointment, please call your pharmacy.   Testing/Procedures: Remote monitoring is used to monitor  your Pacemaker or ICD from home. This monitoring reduces the number of office visits required to check your device to one time per year. It allows [name] to keep an eye on the functioning of your device to ensure it is working properly. You are scheduled for a device check from home on Wednesday, January 22nd, 2020. You may send your transmission at any time that day. If you have a wireless device, the transmission will be sent automatically. After your physician reviews your transmission, you will receive a postcard with your next transmission date.  To improve our patient care and to more adequately follow your device, [facility] has decided, as a practice, to start following each patient four times a year with your home monitor. This means that you may experience a remote appointment that is close to an in-office appointment with your physician. Your insurance will apply at the same rate as other remote monitoring transmissions.  Follow-Up: Please schedule a follow-up appointment with Dr Servando Snare in May 2020.  At [facility], you and your health needs are our priority.  As part of our continuing mission to provide you with exceptional heart care, we have created designated Provider Care Teams.  These Care Teams include your primary Cardiologist (physician) and Advanced Practice Providers (APPs -  Physician Assistants and Nurse Practitioners) who all work together to provide you with the care you need, when you need it. You will need a follow up appointment in 7 months. You may see Sanda Klein, MD or one of the following Advanced Practice Providers on your designated Care Team: [place], [place] . Fabian Sharp, PA-C . You will receive  a reminder letter in the mail two months in advance. If you don't receive a letter, please call our office to schedule the follow-up appointment.    Signed, Sanda Klein, MD  12/08/2018 3:18 PM    [address], [place],  [place]  [address] Phone: [phone]; Fax: [phone]

## 2018-12-07 NOTE — Telephone Encounter (Signed)
Can you clarify the dosage on this medication?

## 2018-12-08 ENCOUNTER — Encounter: Payer: Self-pay | Admitting: Cardiovascular Disease

## 2018-12-08 DIAGNOSIS — Z6835 Body mass index (BMI) 35.0-35.9, adult: Secondary | ICD-10-CM

## 2018-12-12 ENCOUNTER — Encounter: Payer: Self-pay | Admitting: Family Medicine

## 2018-12-14 ENCOUNTER — Other Ambulatory Visit: Payer: Self-pay | Admitting: Family Medicine

## 2018-12-14 DIAGNOSIS — F418 Other specified anxiety disorders: Secondary | ICD-10-CM

## 2018-12-14 MED ORDER — ALPRAZOLAM 1 MG PO TABS: 0.5000 mg | ORAL_TABLET | Freq: Three times a day (TID) | ORAL | 0 refills | Status: DC | PRN

## 2018-12-17 ENCOUNTER — Other Ambulatory Visit: Payer: Self-pay | Admitting: Family Medicine

## 2018-12-19 LAB — CUP PACEART INCLINIC DEVICE CHECK
Date Time Interrogation Session: 20200121134549
Implantable Lead Implant Date: 20071016
Implantable Lead Location: 753859
Implantable Lead Location: 753860
Implantable Lead Model: 4092
Implantable Lead Model: 5594
Implantable Pulse Generator Implant Date: 20141001
MDC IDC LEAD IMPLANT DT: 20071016

## 2018-12-25 ENCOUNTER — Telehealth: Payer: Self-pay

## 2018-12-25 ENCOUNTER — Encounter: Payer: Self-pay | Admitting: Cardiology

## 2018-12-25 ENCOUNTER — Ambulatory Visit (INDEPENDENT_AMBULATORY_CARE_PROVIDER_SITE_OTHER): Payer: Medicare Other

## 2018-12-25 DIAGNOSIS — I495 Sick sinus syndrome: Secondary | ICD-10-CM | POA: Diagnosis not present

## 2018-12-25 NOTE — Telephone Encounter (Signed)
Spoke with patient to remind of missed remote transmission 

## 2018-12-27 ENCOUNTER — Other Ambulatory Visit: Payer: Self-pay | Admitting: *Deleted

## 2018-12-27 ENCOUNTER — Other Ambulatory Visit: Payer: Self-pay | Admitting: Family Medicine

## 2018-12-27 ENCOUNTER — Telehealth: Payer: Self-pay | Admitting: Family Medicine

## 2018-12-27 ENCOUNTER — Encounter: Payer: Self-pay | Admitting: Family Medicine

## 2018-12-27 DIAGNOSIS — I71019 Dissection of thoracic aorta, unspecified: Secondary | ICD-10-CM

## 2018-12-27 DIAGNOSIS — F418 Other specified anxiety disorders: Secondary | ICD-10-CM

## 2018-12-27 DIAGNOSIS — I7101 Dissection of thoracic aorta: Secondary | ICD-10-CM

## 2018-12-27 MED ORDER — ALPRAZOLAM 1 MG PO TABS: 0.5000 mg | ORAL_TABLET | Freq: Three times a day (TID) | ORAL | 2 refills | Status: DC | PRN

## 2018-12-27 NOTE — Telephone Encounter (Signed)
Patient states that she feels the directions are incorrect on the medication ALPRAZolam (XANAX) 1 MG tablet. The directions state "Take 0.5 tablets (0.5 mg total) by mouth 3 (three) times daily as needed for anxiety" but patient gets a 30 tablet supply. Patient inquired if she should actually be getting 45 tablets, as she is running out of medication early each month but states she is taking the medication correctly according to directions. Please advise.

## 2018-12-27 NOTE — Telephone Encounter (Signed)
Copied from [place] [id number]. Topic: Quick Communication - See Telephone Encounter >> Dec 27, 2018  9:26 AM Sheran Luz wrote: CRM for notification. See Telephone encounter for: 12/27/18.   Patient states that she feels the directions are incorrect on the medication ALPRAZolam (XANAX) 1 MG tablet. The directions state "Take 0.5 tablets (0.5 mg total) by mouth 3 (three) times daily as needed for anxiety" but patient gets a 30 tablet supply. Patient inquired if she should actually be getting 45 tablets, as she is running out of medication early each month but states she is taking the medication correctly according to directions. Please advise.

## 2018-12-27 NOTE — Telephone Encounter (Signed)
Directions are as needed ----- not meant to be taken tid regularly ------she did used to get #60 and it got dec to 30 at some point ---- I will increase it again but the rx we give is meant to last a month

## 2018-12-28 LAB — CUP PACEART REMOTE DEVICE CHECK
Battery Impedance: 375 Ohm
Battery Voltage: 2.79 V
Brady Statistic AP VP Percent: 1 %
Brady Statistic AP VS Percent: 95 %
Brady Statistic AS VP Percent: 0 %
Brady Statistic AS VS Percent: 4 %
Date Time Interrogation Session: 20200127211124
Implantable Lead Implant Date: 20071016
Implantable Lead Implant Date: 20071016
Implantable Lead Location: 753859
Implantable Lead Location: 753860
Implantable Lead Model: 4092
Implantable Lead Model: 5594
Implantable Pulse Generator Implant Date: 20141001
Lead Channel Impedance Value: 413 Ohm
Lead Channel Impedance Value: 769 Ohm
Lead Channel Pacing Threshold Amplitude: 0.625 V
Lead Channel Pacing Threshold Amplitude: 0.625 V
Lead Channel Pacing Threshold Pulse Width: 0.4 ms
Lead Channel Setting Pacing Amplitude: 1.5 V
Lead Channel Setting Pacing Amplitude: 2 V
Lead Channel Setting Pacing Pulse Width: 0.4 ms
Lead Channel Setting Sensing Sensitivity: 5.6 mV
MDC IDC MSMT BATTERY REMAINING LONGEVITY: 117 mo
MDC IDC MSMT LEADCHNL RV PACING THRESHOLD PULSEWIDTH: 0.4 ms

## 2018-12-28 NOTE — Progress Notes (Signed)
Remote pacemaker transmission.   

## 2018-12-28 NOTE — Telephone Encounter (Signed)
I refilled it yesterday

## 2019-01-01 NOTE — Telephone Encounter (Signed)
Was on phone with patient for a little over 14 minutes in regards to alprazolam prescription.  Patient wanted [name] to know that she does take her medication as directed.  We gave her 30 tabs in December which shorted her 8 days. Advised that Dr. Etter Sjogren sent in 1mg  tabs to take 0.5 tab tid prn #60. She states that she takes usually takes 0.5mg  bid as needed and 1 extra only if need but is rare.  For the next refill which should be 3 months around march can we switch back to her original dose the 0.5mg  tabs tid prn?  Ok to wait for [employer]

## 2019-01-09 NOTE — Telephone Encounter (Signed)
yes

## 2019-01-10 ENCOUNTER — Encounter (HOSPITAL_COMMUNITY): Payer: Self-pay | Admitting: Internal Medicine

## 2019-01-10 ENCOUNTER — Ambulatory Visit (HOSPITAL_BASED_OUTPATIENT_CLINIC_OR_DEPARTMENT_OTHER)
Admission: RE | Admit: 2019-01-10 | Discharge: 2019-01-10 | Disposition: A | Discharge status: Home / Self Care | Payer: Medicare Other | Source: Ambulatory Visit | Attending: Internal Medicine | Admitting: Internal Medicine

## 2019-01-10 ENCOUNTER — Ambulatory Visit (HOSPITAL_COMMUNITY)
Admission: RE | Admit: 2019-01-10 | Discharge: 2019-01-10 | Disposition: A | Discharge status: Home / Self Care | Payer: Medicare Other | Source: Ambulatory Visit | Attending: Internal Medicine | Admitting: Internal Medicine

## 2019-01-10 VITALS — BP 142/72 | HR 58 | Wt 196.2 lb

## 2019-01-10 DIAGNOSIS — E1169 Type 2 diabetes mellitus with other specified complication: Secondary | ICD-10-CM

## 2019-01-10 DIAGNOSIS — I350 Nonrheumatic aortic (valve) stenosis: Secondary | ICD-10-CM | POA: Diagnosis not present

## 2019-01-10 DIAGNOSIS — I5032 Chronic diastolic (congestive) heart failure: Secondary | ICD-10-CM | POA: Diagnosis not present

## 2019-01-10 DIAGNOSIS — Z79899 Other long term (current) drug therapy: Secondary | ICD-10-CM | POA: Insufficient documentation

## 2019-01-10 DIAGNOSIS — Z882 Allergy status to sulfonamides status: Secondary | ICD-10-CM | POA: Insufficient documentation

## 2019-01-10 DIAGNOSIS — Z6838 Body mass index (BMI) 38.0-38.9, adult: Secondary | ICD-10-CM | POA: Diagnosis not present

## 2019-01-10 DIAGNOSIS — Z885 Allergy status to narcotic agent status: Secondary | ICD-10-CM | POA: Diagnosis not present

## 2019-01-10 DIAGNOSIS — I7101 Dissection of thoracic aorta: Secondary | ICD-10-CM

## 2019-01-10 DIAGNOSIS — Z8249 Family history of ischemic heart disease and other diseases of the circulatory system: Secondary | ICD-10-CM | POA: Diagnosis not present

## 2019-01-10 DIAGNOSIS — Z7901 Long term (current) use of anticoagulants: Secondary | ICD-10-CM | POA: Insufficient documentation

## 2019-01-10 DIAGNOSIS — I4892 Unspecified atrial flutter: Secondary | ICD-10-CM | POA: Insufficient documentation

## 2019-01-10 DIAGNOSIS — Z950 Presence of cardiac pacemaker: Secondary | ICD-10-CM | POA: Diagnosis not present

## 2019-01-10 DIAGNOSIS — Z85828 Personal history of other malignant neoplasm of skin: Secondary | ICD-10-CM | POA: Insufficient documentation

## 2019-01-10 DIAGNOSIS — I352 Nonrheumatic aortic (valve) stenosis with insufficiency: Secondary | ICD-10-CM | POA: Diagnosis not present

## 2019-01-10 DIAGNOSIS — I5042 Chronic combined systolic (congestive) and diastolic (congestive) heart failure: Secondary | ICD-10-CM | POA: Insufficient documentation

## 2019-01-10 DIAGNOSIS — Z881 Allergy status to other antibiotic agents status: Secondary | ICD-10-CM | POA: Diagnosis not present

## 2019-01-10 DIAGNOSIS — Z87891 Personal history of nicotine dependence: Secondary | ICD-10-CM | POA: Insufficient documentation

## 2019-01-10 DIAGNOSIS — Z7984 Long term (current) use of oral hypoglycemic drugs: Secondary | ICD-10-CM | POA: Insufficient documentation

## 2019-01-10 DIAGNOSIS — E669 Obesity, unspecified: Secondary | ICD-10-CM

## 2019-01-10 DIAGNOSIS — E785 Hyperlipidemia, unspecified: Secondary | ICD-10-CM | POA: Diagnosis not present

## 2019-01-10 DIAGNOSIS — I71019 Dissection of thoracic aorta, unspecified: Secondary | ICD-10-CM

## 2019-01-10 DIAGNOSIS — I428 Other cardiomyopathies: Secondary | ICD-10-CM | POA: Insufficient documentation

## 2019-01-10 DIAGNOSIS — I110 Hypertensive heart disease with heart failure: Secondary | ICD-10-CM | POA: Diagnosis not present

## 2019-01-10 DIAGNOSIS — E119 Type 2 diabetes mellitus without complications: Secondary | ICD-10-CM | POA: Insufficient documentation

## 2019-01-10 DIAGNOSIS — I272 Pulmonary hypertension, unspecified: Secondary | ICD-10-CM | POA: Insufficient documentation

## 2019-01-10 DIAGNOSIS — K219 Gastro-esophageal reflux disease without esophagitis: Secondary | ICD-10-CM | POA: Diagnosis not present

## 2019-01-10 DIAGNOSIS — I5022 Chronic systolic (congestive) heart failure: Secondary | ICD-10-CM

## 2019-01-10 DIAGNOSIS — I495 Sick sinus syndrome: Secondary | ICD-10-CM | POA: Insufficient documentation

## 2019-01-10 DIAGNOSIS — F419 Anxiety disorder, unspecified: Secondary | ICD-10-CM | POA: Diagnosis not present

## 2019-01-10 DIAGNOSIS — Z9889 Other specified postprocedural states: Secondary | ICD-10-CM | POA: Insufficient documentation

## 2019-01-10 MED ORDER — EMPAGLIFLOZIN 10 MG PO TABS: 10.0000 mg | ORAL_TABLET | Freq: Every day | ORAL | 3 refills | Status: DC

## 2019-01-10 MED ORDER — LOSARTAN POTASSIUM 25 MG PO TABS: 25.0000 mg | ORAL_TABLET | Freq: Every day | ORAL | 3 refills | Status: DC

## 2019-01-10 NOTE — Progress Notes (Addendum)
Advanced Heart Failure Clinic Consult Note   PCP: [name], DO PCP-Cardiologist: Sanda Klein, MD   HPI:  [name] is a 73 y.o. female with chronic diastolic CHF with newly reduced EF, NICM, DM2, h/o repaired type A aortic dissection (resuspended native aortic valve), SSS with sinus node arrest s/p PPM 2007, generator exchange 2014, morbid obesity, and HTN.   Referred by Dr. Kennith Center at [facility] for further evaluation of reduced EF.   She has yearly follow up with Dr. Madolyn Frieze s/p Type A aortic dissection repair. Stable surgical repair by CT 01/2018.  Last seen in [facility] clinic 06/22/18. Followed chronically by Dr. Sallyanne Kuster. At that visit, she was stable, but there was some concern over worsening AS by Echo as below. At that visit, she was only RV pacing 30% of the time.   Pt was recently admitted to [facility] for 3 day history of fatigue, SOB, chills, and malaise with severe chest pain radiating to her back. CT findings concerning for worsening type-B dissection. CT (Outside hospital) concerning for worsening type-B dissection extending from just distal to the L subclavian artery down into the L CIA.   (Please note the [facility] notes go back and forth between calling it type-A and type-B). This is the read from Cardiology consult on 11/01/18.   Echo 10/29/18 showed LVEF down to 35% with mildly decreased RV, mild pHTN, and Mod/severe AS (low flo/low gradient).   L/RHC 11/02/18 with compensated filling pressures and stable CO. Dr. Haroldine Laws contacted with newly low EF for consultation as outpatient.  Plan was to continue to pursue BP control and no surgical intervention was recommended during that admission with relief of her chest pain. On 11/03/18, noted to have 11.9% AS-VP, and 71.7% AP-VP. AV delay increased to allow for less ventricular pacing.   She returns today for HF follow up. Last visit she was lightheaded so Benicar was decreased. She is now off benicar. Overall feeling good. No SOB. No  problems with hills/steps. Denies edema, orthopnea, or PND. No CP or palpitations. No dizziness or presyncope. No bleeding on Xarelto. She feels anxious today. She has lost 26 lbs by dieting. Having some low sugars, down to 85 before she has to eat carbs to bring it up. Doesn't see endo until May. Does better with taking meds at noon. Limits salt intake. Drinks a lot of water. SBP 110-130 at home. Taking all medications, but held lasix this morning since she was coming here.   Echo today EF ~45% per Dr Haroldine Laws.   Labs [facility] 11/03/18 Cr 0.68, K 4.3.   CTA 01/2018 showing aortic dissection distal to the ascending arch repair with the dissection extending down the descending aorta into the L CIA. No R CIA involvement. Distal aortic arch shows 3.4cm aneurysm. Involvement of the thoracic and abdominal aorta was stable at that time.  Echo 11/2017 LVEF 55-60%, Grade 1 DD, Peak and Mean gradients through AV are 40 and 24 mm Hg respectively consistent with moderate AS.   Echo 12/2015 LVEF 55-60%, Grade 1 DD, Mild AS, Mod LAE, Mild RAE.   [facility] 11/02/18 ([facility]) - No angiographic evidence of significant disease.  RA mean 6 RV 45/10 PA 45/16 (26) PCWP 12 AO 90% Cardiac Output (Fick) 4.6 Cardiac Index (Fick) 2.4 SVR 1443  PVR 3 WU LVEDP 18 Mean AV gradient: 21.2 mmHg  Peak to peak gradient: 16 Valve area (gorlin): 1.1 cm2 (index 0.6)  Review of systems complete and found to be negative unless  listed in HPI.   Past Medical History:  Diagnosis Date  . Acute thoracic aortic dissection [facility]) 2008   emergency surgery - [place]  . Anxiety   . Cancer ([place])    skin  . Diabetes mellitus   . Diverticulosis   . GERD (gastroesophageal reflux disease)   . Hemorrhoids   . Hyperlipidemia   . Hypertension   . Kidney stones   . Sinus node dysfunction (HCC)    hx of PPM    Current Outpatient Medications  Medication Sig Dispense Refill  . ALPRAZolam (XANAX) 0.5 MG tablet Take 0.5 mg by mouth 3 (three)  times daily as needed for anxiety.    . carvedilol (COREG) 25 MG tablet TAKE 1 TABLET BY MOUTH TWICE A DAY WITH A MEAL 180 tablet 3  . cholecalciferol (VITAMIN D) 1000 UNITS tablet Take 1,000 Units by mouth daily.    . citalopram (CELEXA) 20 MG tablet TAKE 3 TABLETS BY MOUTH EVERY DAY 270 tablet 2  . fenofibrate 160 MG tablet TAKE 1 TABLET BY MOUTH EVERY DAY 90 tablet 1  . fluticasone (FLONASE) 50 MCG/ACT nasal spray USE 2 SPRAYS IN EACH NOSTIRL EVERY DAY 16 g 1  . furosemide (LASIX) 40 MG tablet TAKE 1 TABLET BY MOUTH EVERY DAY 90 tablet 1  . glipiZIDE (GLUCOTROL XL) 2.5 MG 24 hr tablet TAKE 1 TABLET (2.5 MG TOTAL) BY MOUTH DAILY BEFORE BREAKFAST. 90 tablet 1  . hyoscyamine (LEVSIN SL) 0.125 MG SL tablet TAKE 1 TABLET BY MOUTH TWICE A DAY AS NEEDED FOR CRAMPING OR DIARRHEA OR LOOSE STOOLS 60 tablet 0  . JANUVIA 100 MG tablet TAKE 1 TABLET BY MOUTH EVERY DAY 30 tablet 2  . lansoprazole (PREVACID) 15 MG capsule Take 15 mg by mouth as needed (for heart burn).     . magnesium oxide (MAG-OX) 400 MG tablet Take 400 mg by mouth daily.    . Omega-3 Fatty Acids (FISH OIL) 1000 MG CAPS Take by mouth.    Vladimir Faster Glycol-Propyl Glycol (SYSTANE OP) Apply 1 drop to eye 4 (four) times daily - after meals and at bedtime.    . Probiotic Product (PRO-BIOTIC BLEND PO) Take 1 tablet by mouth as needed.     . rosuvastatin (CRESTOR) 20 MG tablet TAKE 1 TABLET BY MOUTH EVERY DAY 90 tablet 0  . XARELTO 20 MG TABS tablet TAKE 1 TABLET BY MOUTH EVERY DAY WITH SUPPER 30 tablet 5   No current facility-administered medications for this encounter.     Allergies  Allergen Reactions  . Oxycodone Itching  . Vicodin [Hydrocodone-Acetaminophen] Itching  . Neomycin-Bacitracin Zn-Polymyx Other (See Comments)    Red , swollen eye  . Sulfonamide Derivatives Other (See Comments)    Just had reaction when taking during pregnancy- last child birth 66      Social History   Socioeconomic History  . Marital status:  Widowed    Spouse name: Not on file  . Number of children: 2  . Years of education: Not on file  . Highest education level: Not on file  Occupational History    Employer: RETIRED  Social Needs  . Financial resource strain: Not on file  . Food insecurity:    Worry: Not on file    Inability: Not on file  . Transportation needs:    Medical: Not on file    Non-medical: Not on file  Tobacco Use  . Smoking status: Former Smoker    Last attempt to quit: 09/04/1993  Years since quitting: 25.3  . Smokeless tobacco: Never Used  Substance and Sexual Activity  . Alcohol use: No  . Drug use: No  . Sexual activity: Not Currently  Lifestyle  . Physical activity:    Days per week: Not on file    Minutes per session: Not on file  . Stress: Not on file  Relationships  . Social connections:    Talks on phone: Not on file    Gets together: Not on file    Attends religious service: Not on file    Active member of club or organization: Not on file    Attends meetings of clubs or organizations: Not on file    Relationship status: Not on file  . Intimate partner violence:    Fear of current or ex partner: Not on file    Emotionally abused: Not on file    Physically abused: Not on file    Forced sexual activity: Not on file  Other Topics Concern  . Not on file  Social History Narrative   Lives at home and husband was just moved to [place]      Family History  Problem Relation Age of Onset  . Kidney disease Mother   . Heart attack Father   . Heart attack Maternal Grandmother   . Valvular heart disease Son        valve replacement at 71  . Cancer Neg Hx     Vitals:   01/10/19 1412  BP: (!) 142/72  Pulse: (!) 58  SpO2: 96%  Weight: 89 kg (196 lb 3.2 oz)   Wt Readings from Last 3 Encounters:  01/10/19 89 kg (196 lb 3.2 oz)  12/06/18 89.4 kg (197 lb 3.2 oz)  11/13/18 89.4 kg (197 lb)    PHYSICAL EXAM: General: Well appearing. No resp difficulty. HEENT: Normal Neck:  Supple. JVP ~7. Carotids 2+ bilat; no bruits. No thyromegaly or nodule noted. Cor: PMI nondisplaced. RRR, 3/6 AS, s2 aduible Lungs: CTAB, normal effort. Abdomen: Soft, non-tender, non-distended, no HSM. No bruits or masses. +BS  Extremities: No cyanosis, clubbing, or rash. R and LLE no edema.  Neuro: Alert & orientedx3, cranial nerves grossly intact. moves all 4 extremities w/o difficulty. Affect pleasant   ASSESSMENT & PLAN:  1. Chronic systolic CHF - LVEF 10-17% on Echo 12/23/17 -> 35% by Echo 10/29/18 at [facility] with mildly decreased RV, mild pHTN, and Mod/severe AS (low flo/low gradient) - Normal coronaries by cath 11/02/18 at [facility] - ? Etiology as ++ RV pacing vs Takotsubo cardiomyopathy in setting of "significant family stress" - NYHA II symptoms - Volume status okay.  - Continue lasix 40 mg daily.    - Continue coreg 25 mg BID - Start losartan 25 mg daily - Start jardiance 10 mg daily. BMET in 1 week.  - Reinforced fluid restriction to < 2 L daily, sodium restriction to less than 2000 mg daily, and the importance of daily weights.   2. AS:  - Per Dr. Sallyanne Kuster, Echo 12/23/17 shows at least slight worsening of AS compared to previous Echo. - Echo 10/29/18 showed LVEF down to 35% with mildly decreased RV, mild pHTN, and Mod/severe AS (low flo/low gradient).  - By [facility] 11/02/18 Mean AV gradient: 21.2 mmHg, Valve area (gorlin): 1.1 cm2 (index 0.6) consistent with Mod AS.  3. SSS s/p PM - Dr. Loletha Grayer follows.  - s/p Sinus node arrest, "Atrially dependent" and with evidence of some AV node conduction abnormalities and intraventricular conduction  abnormalities.  - Continue BB in setting of h/o of Ao dissection.  - On 11/03/18, noted to have 11.9% AS-VP, and 71.7% AP-VP. AV delay increased that day to allow for less ventricular pacing.  - A-pacing 87% per Dr Victorino December note last month.  5. AFlutter:  - She denies any episodes in the last 3 or 4 years.  No change.  - Continue Xarelto 20 mg daily. Can  discuss further with Dr. Loletha Grayer. Could consider switch to Eliquis. - This patients CHA2DS2-VASc 6 6. Hx repair of Type A Ao dissection with concerns for worsening type B dissection - She has yearly follow-up with Dr. Servando Snare.  - Admitted to [facility] from outside hospital earlier this month with sudden chest pain and CT (Outside hospital) concerning for worsening type-B dissection extending from just distal to the L subclavian artery down into the L CIA.  Cardiac surgery consulted at [facility] and no surgery recommended at that time.   - She can follow up further with Dr. Servando Snare. No change.  7. HTN:  - Elevated today. Start losartan as above.  8. HLD  - LDL at target per Dr. Sallyanne Kuster. No change.  9. DM:  - Per PCP. - On Januvia - Having some low blood sugars at home. Stop glipizide. Start jardiance 10 mg daily. Check BMET in 1 week.  10. Obesity:  - Body mass index is 38.32 kg/m.     Georgiana Shore, NP 01/10/19   Patient seen and examined with the above-signed Advanced Practice Provider and/or Housestaff. I personally reviewed laboratory data, imaging studies and relevant notes. I independently examined the patient and formulated the important aspects of the plan. I have edited the note to reflect any of my changes or salient points. I have personally discussed the plan with the patient and/or family.  Overall much improved. Echo shows EF 45-50% with septal dyssynergy. (Viewed personally) Says she has lost 26 pounds but weight unchanged on our scale. Volume status looks good. NYHA II. Pacer adjusted by Dr. Bonne Dolores to limit RV pacing and now A-pacing > 85% of the time, NYHA II. She is having hypoglycemic episodes with glipizide. Will switch to Jardiance. Add low dose losartan 25 mg daily as well. Check BMET next week. AS is moderate with crisp s2 on exam.   She will graduate from the HF Clinic and f/u with Dr. Bonne Dolores. We are happy to see back as needed.  Glori Bickers, MD  3:17 PM

## 2019-01-10 NOTE — Progress Notes (Signed)
  Echocardiogram 2D Echocardiogram has been performed.  Zakariye Nee G Ernst [place] 01/10/2019, 3:10 PM

## 2019-01-10 NOTE — Patient Instructions (Signed)
Labs will need to be done in 1 week.  STOP Glipizide  START Jardiance 10mg  (1 tab) daily  START Losartan 25mg  (1 tab) daily  Follow up with Dr. Haroldine Laws as needed. No appointment needs to be scheduled at this time.

## 2019-01-11 ENCOUNTER — Other Ambulatory Visit: Payer: Self-pay

## 2019-01-11 DIAGNOSIS — I350 Nonrheumatic aortic (valve) stenosis: Secondary | ICD-10-CM

## 2019-01-18 ENCOUNTER — Other Ambulatory Visit (HOSPITAL_COMMUNITY): Payer: Medicare Other

## 2019-01-23 ENCOUNTER — Other Ambulatory Visit: Payer: Self-pay | Admitting: Family Medicine

## 2019-01-23 DIAGNOSIS — N12 Tubulo-interstitial nephritis, not specified as acute or chronic: Secondary | ICD-10-CM

## 2019-01-26 NOTE — Telephone Encounter (Signed)
Requesting: Tramadol Contract: No UDS: No-last 09/2017 Last OV: 11/13/2018 Next OV: N/A Last Refill: 05/11/2018 Database:   Please advise

## 2019-01-26 NOTE — Telephone Encounter (Signed)
Last written:  Last ov: 11/13/18 Next ov: none Contract: UDS:

## 2019-02-09 ENCOUNTER — Other Ambulatory Visit (HOSPITAL_COMMUNITY): Payer: Self-pay | Admitting: *Deleted

## 2019-02-09 ENCOUNTER — Other Ambulatory Visit: Payer: Self-pay | Admitting: Internal Medicine

## 2019-02-09 ENCOUNTER — Other Ambulatory Visit: Payer: Self-pay | Admitting: Family Medicine

## 2019-02-09 ENCOUNTER — Telehealth: Payer: Self-pay | Admitting: Internal Medicine

## 2019-02-09 DIAGNOSIS — IMO0002 Reserved for concepts with insufficient information to code with codable children: Secondary | ICD-10-CM

## 2019-02-09 DIAGNOSIS — E1165 Type 2 diabetes mellitus with hyperglycemia: Principal | ICD-10-CM

## 2019-02-09 DIAGNOSIS — E1151 Type 2 diabetes mellitus with diabetic peripheral angiopathy without gangrene: Secondary | ICD-10-CM

## 2019-02-09 DIAGNOSIS — I10 Essential (primary) hypertension: Secondary | ICD-10-CM

## 2019-02-09 MED ORDER — FUROSEMIDE 40 MG PO TABS: 40.0000 mg | ORAL_TABLET | Freq: Every day | ORAL | 1 refills | Status: DC

## 2019-02-09 MED ORDER — EMPAGLIFLOZIN 10 MG PO TABS: 10.0000 mg | ORAL_TABLET | Freq: Every day | ORAL | 3 refills | Status: DC

## 2019-02-09 NOTE — Telephone Encounter (Signed)
MEDICATION: JANUVIA 100 MG tablet  PHARMACY:   [id number] [place], [place] (Phone) [phone] (Fax)    IS THIS A 90 DAY SUPPLY : No  IS PATIENT OUT OF MEDICATION: No-but does not want to run out   IF NOT; HOW MUCH IS LEFT: 7  LAST APPOINTMENT DATE: @3 /13/2020  NEXT APPOINTMENT DATE:@Visit  date not found  DO WE HAVE YOUR PERMISSION TO LEAVE A DETAILED MESSAGE: Yes  OTHER COMMENTS: If there is a problem please contact patient   **Let patient know to contact pharmacy at the end of the day to make sure medication is ready. **  ** Please notify patient to allow 48-72 hours to process**  **Encourage patient to contact the pharmacy for refills or they can request refills through [facility]**

## 2019-02-09 NOTE — Telephone Encounter (Signed)
Copied from [place] [id number]. Topic: Quick Communication - Rx Refill/Question >> Feb 09, 2019 11:54 AM Margot Ables wrote: Medication: furosemide (LASIX) 40 MG tablet - 10 tablets left - called the pharmacy this morning but I don't see electronic request - pt requesting 90 day supply please  Has the patient contacted their pharmacy? Yes.  They said they sent request (Agent: If no, request that the patient contact the pharmacy for the refill.) (Agent: If yes, when and what did the pharmacy advise?)  Preferred Pharmacy (with phone number or street name): [id number] [address], [place] [phone] (Phone) [phone] (Fax)

## 2019-02-09 NOTE — Telephone Encounter (Signed)
Requested Prescriptions  Pending Prescriptions Disp Refills  . furosemide (LASIX) 40 MG tablet 90 tablet 1    Sig: Take 1 tablet (40 mg total) by mouth daily.     Cardiovascular:  Diuretics - Loop Failed - 02/09/2019 12:17 PM      Failed - Cr in normal range and within 360 days    Creat  Date Value Ref Range Status  01/01/2014 0.74 0.50 - 1.10 mg/dL Final   Creatinine, Ser  Date Value Ref Range Status  05/05/2018 1.11 (H) 0.44 - 1.00 mg/dL Final         Failed - Last BP in normal range    BP Readings from Last 1 Encounters:  01/10/19 (!) 142/72         Passed - K in normal range and within 360 days    Potassium  Date Value Ref Range Status  05/05/2018 4.3 3.5 - 5.1 mmol/L Final         Passed - Ca in normal range and within 360 days    Calcium  Date Value Ref Range Status  05/05/2018 8.9 8.9 - 10.3 mg/dL Final         Passed - Na in normal range and within 360 days    Sodium  Date Value Ref Range Status  05/05/2018 140 135 - 145 mmol/L Final         Passed - Valid encounter within last 6 months    Recent Outpatient Visits          2 months ago [place] [facility] [place] R, DO   9 months ago Pyelonephritis   [facility] [place], DO   1 year ago Hyperlipidemia LDL goal <100   [facility] at [place] R, DO   2 years ago DM (diabetes mellitus) type II uncontrolled, periph vascular disorder ([place])   [facility] at [place], DO   2 years ago Preventative health care   [employer] at [place], [place], [place]

## 2019-02-09 NOTE — Telephone Encounter (Signed)
There is already a request in process.

## 2019-02-13 ENCOUNTER — Other Ambulatory Visit: Payer: Self-pay | Admitting: Internal Medicine

## 2019-02-13 DIAGNOSIS — E1165 Type 2 diabetes mellitus with hyperglycemia: Principal | ICD-10-CM

## 2019-02-13 DIAGNOSIS — IMO0002 Reserved for concepts with insufficient information to code with codable children: Secondary | ICD-10-CM

## 2019-02-13 DIAGNOSIS — E1151 Type 2 diabetes mellitus with diabetic peripheral angiopathy without gangrene: Secondary | ICD-10-CM

## 2019-02-13 NOTE — Telephone Encounter (Signed)
I am ok with this change, but she does not have an appointment scheduled with me in the near future, so I would make sure that she has follow-up with cardiology about this.

## 2019-02-13 NOTE — Telephone Encounter (Signed)
Patient saw cardio 01/10/19 and they told patient to stop Glipizide and start Jardiance.  Please advise.

## 2019-02-14 ENCOUNTER — Other Ambulatory Visit: Payer: Self-pay | Admitting: Internal Medicine

## 2019-02-14 DIAGNOSIS — E1151 Type 2 diabetes mellitus with diabetic peripheral angiopathy without gangrene: Secondary | ICD-10-CM

## 2019-02-14 DIAGNOSIS — IMO0002 Reserved for concepts with insufficient information to code with codable children: Secondary | ICD-10-CM

## 2019-02-14 DIAGNOSIS — E1165 Type 2 diabetes mellitus with hyperglycemia: Principal | ICD-10-CM

## 2019-02-15 ENCOUNTER — Encounter: Payer: Medicare Other | Admitting: Cardiothoracic Surgery

## 2019-02-15 ENCOUNTER — Other Ambulatory Visit: Payer: Medicare Other

## 2019-02-28 ENCOUNTER — Other Ambulatory Visit: Payer: Self-pay

## 2019-02-28 ENCOUNTER — Encounter: Payer: Self-pay | Admitting: Family Medicine

## 2019-02-28 ENCOUNTER — Ambulatory Visit (INDEPENDENT_AMBULATORY_CARE_PROVIDER_SITE_OTHER): Payer: Medicare Other | Admitting: Family Medicine

## 2019-02-28 DIAGNOSIS — M5440 Lumbago with sciatica, unspecified side: Secondary | ICD-10-CM

## 2019-02-28 DIAGNOSIS — M545 Low back pain, unspecified: Secondary | ICD-10-CM | POA: Insufficient documentation

## 2019-02-28 MED ORDER — TIZANIDINE HCL 4 MG PO TABS: 4.0000 mg | ORAL_TABLET | Freq: Four times a day (QID) | ORAL | 0 refills | Status: DC | PRN

## 2019-02-28 MED ORDER — TRAMADOL HCL 50 MG PO TABS: ORAL_TABLET | ORAL | 0 refills | Status: DC

## 2019-02-28 NOTE — Progress Notes (Signed)
Virtual Visit via Telephone Note  I connected with [name] on 02/28/19 at 10:30 AM EDT by telephone and verified that I am speaking with the correct person using two identifiers.   I discussed the limitations, risks, security and privacy concerns of performing an evaluation and management service by telephone and the availability of in person appointments. I also discussed with the patient that there may be a patient responsible charge related to this service. The patient expressed understanding and agreed to proceed.   History of Present Illness: Pt is home and c/o back pain with radiation down right leg -- almost to knee   Pt states she pulled something that set it off about 1 week ago ---- she is taking tylenol with no relief.  She is afraid to go out due to covid 19 and has been told by her cardiologist to stay home    Observations/Objective: Pt is in NAD No sob Afebrile    Assessment and Plan:  1. Low back pain with radiation Refill ultram Muscle relaxer per order Ice alt heat Rest  rto prn  - tiZANidine (ZANAFLEX) 4 MG tablet; Take 1 tablet (4 mg total) by mouth every 6 (six) hours as needed for muscle spasms.  Dispense: 30 tablet; Refill: 0 - traMADol (ULTRAM) 50 MG tablet; TAKE 1 TO 2 TABLETS BY MOUTH EVERY 6 HOURS AS NEEDED  Dispense: 30 tablet; Refill: 0  Follow Up Instructions:    I discussed the assessment and treatment plan with the patient. The patient was provided an opportunity to ask questions and all were answered. The patient agreed with the plan and demonstrated an understanding of the instructions.   The patient was advised to call back or seek an in-person evaluation if the symptoms worsen or if the condition fails to improve as anticipated.  I provided 15 minutes of non-face-to-face time during this encounter.   [name], DO

## 2019-03-05 ENCOUNTER — Other Ambulatory Visit: Payer: Self-pay

## 2019-03-05 ENCOUNTER — Telehealth: Payer: Self-pay | Admitting: Family Medicine

## 2019-03-05 ENCOUNTER — Telehealth: Payer: Self-pay

## 2019-03-05 DIAGNOSIS — M5440 Lumbago with sciatica, unspecified side: Principal | ICD-10-CM

## 2019-03-05 DIAGNOSIS — M545 Low back pain, unspecified: Secondary | ICD-10-CM

## 2019-03-05 NOTE — Telephone Encounter (Signed)
See note dated 03/05/2019 regarding medication request.

## 2019-03-05 NOTE — Telephone Encounter (Signed)
Can we do web ex or [email or web address]?

## 2019-03-05 NOTE — Telephone Encounter (Signed)
Patient called in to office today. Requests medication refill on medications that were ordered on 02/28/2019. Advised patient that I would discuss with Dr. Carollee Herter and call her back.  Requesting Tramadol 50 mg and Tizanidine 4 mg. Given # 30 of both medications on 02/28/19. Please advise

## 2019-03-05 NOTE — Telephone Encounter (Signed)
Pt calling back to check status   Copied from [place] [phone]. Topic: General - Inquiry >> Mar 02, 2019  4:17 PM [name] wrote: Reason for CRM: pt said she was given some medication by Dr Etter Sjogren at her visit on 4/1 ( does not know the name ). She would like to speak to the nurse regarding this.

## 2019-03-05 NOTE — Telephone Encounter (Signed)
Attempted to reach pt and left message to return our call. [place] for [facility] to obtain more information when pt returns our call. Needs to know what questions she has about medication that was prescribed.

## 2019-03-05 NOTE — Telephone Encounter (Signed)
[name] could you set up Web visit for patient. Please advise.

## 2019-03-06 ENCOUNTER — Ambulatory Visit: Payer: Medicare Other | Admitting: Family Medicine

## 2019-03-06 NOTE — Telephone Encounter (Signed)
Pt called back in and she stated that she has been taking both the tramadol and the muscle relaxer 4 of them a day. She is completely out and was upset that the refill that was suppose to be call in today was not at the pharmacy.  She is expecting a call back 1st thing in the morning with this refill.      Best number- [phone]

## 2019-03-06 NOTE — Telephone Encounter (Signed)
Patient had appointment on 02/28/19

## 2019-03-07 ENCOUNTER — Other Ambulatory Visit: Payer: Self-pay | Admitting: Family Medicine

## 2019-03-07 DIAGNOSIS — M545 Low back pain, unspecified: Secondary | ICD-10-CM

## 2019-03-07 DIAGNOSIS — M5440 Lumbago with sciatica, unspecified side: Principal | ICD-10-CM

## 2019-03-07 MED ORDER — TRAMADOL HCL 50 MG PO TABS: ORAL_TABLET | ORAL | 0 refills | Status: DC

## 2019-03-07 MED ORDER — TIZANIDINE HCL 4 MG PO TABS: 4.0000 mg | ORAL_TABLET | Freq: Four times a day (QID) | ORAL | 0 refills | Status: DC | PRN

## 2019-03-07 NOTE — Telephone Encounter (Signed)
Attempted to reach pt, no answer / no voicemail.

## 2019-03-07 NOTE — Telephone Encounter (Signed)
Attempted to reach pt but line was busy. Will try again later.

## 2019-03-07 NOTE — Telephone Encounter (Signed)
The reason for the web visit was because she ran out of meds and I was hoping she would be feeling better by now--- meds called in but she will need another visit if not any better with this

## 2019-03-12 ENCOUNTER — Other Ambulatory Visit: Payer: Self-pay | Admitting: Family Medicine

## 2019-03-12 DIAGNOSIS — M545 Low back pain, unspecified: Secondary | ICD-10-CM

## 2019-03-12 MED ORDER — DICLOFENAC SODIUM 1 % TD GEL: 2.0000 g | Freq: Four times a day (QID) | TRANSDERMAL | Status: AC

## 2019-03-12 NOTE — Telephone Encounter (Signed)
Pt called in returning [place] call.

## 2019-03-12 NOTE — Telephone Encounter (Signed)
Patient stated that she stopped medication, tramadol and tizanidine, because it did not work.  Over the weekend she tried biofreeze and ice packs that has gave her tremendous relief (better than the medication).  Her friend told her about Voltaren Gel and would like to possibly try.  She will continue biofreeze and ice packs if you like her too.    Also she would like for [name] to go ahead and send in a refill request for alprazolam, she does not want to run out and have to wait.  I will advise that the refill will be waiting at the pharmacy when it is due to be refilled  Last written: 12/27/18 Last ov: 02/28/19 Next ov: none Contract: due UDS: due

## 2019-03-12 NOTE — Progress Notes (Signed)
voltar

## 2019-03-12 NOTE — Telephone Encounter (Signed)
Not sure voltaren will be covered because its only indication is for oa of knees  She really should be getting better by now

## 2019-03-12 NOTE — Telephone Encounter (Signed)
Left message on machine to call back  

## 2019-03-13 ENCOUNTER — Telehealth: Payer: Self-pay | Admitting: *Deleted

## 2019-03-13 ENCOUNTER — Other Ambulatory Visit: Payer: Self-pay | Admitting: Family Medicine

## 2019-03-13 MED ORDER — ALPRAZOLAM 0.5 MG PO TABS: 0.5000 mg | ORAL_TABLET | Freq: Three times a day (TID) | ORAL | 0 refills | Status: DC | PRN

## 2019-03-13 NOTE — Telephone Encounter (Signed)
she would like for [name] to go ahead and send in a refill request for alprazolam, she does not want to run out and have to wait.  I will advise that the refill will be waiting at the pharmacy when it is due to be refilled  Last written: 12/27/18 Last ov: 02/28/19 Next ov: none Contract: due UDS: due

## 2019-03-13 NOTE — Telephone Encounter (Signed)
Patient will continue with what she has and will use tennis ball.  She feels better today.  She will call back to schedule a virtual visit if she is not better.

## 2019-03-15 ENCOUNTER — Telehealth: Payer: Self-pay | Admitting: Family Medicine

## 2019-03-15 ENCOUNTER — Other Ambulatory Visit: Payer: Self-pay | Admitting: Family Medicine

## 2019-03-15 ENCOUNTER — Encounter: Payer: Self-pay | Admitting: Family Medicine

## 2019-03-15 DIAGNOSIS — F419 Anxiety disorder, unspecified: Secondary | ICD-10-CM

## 2019-03-15 MED ORDER — ALPRAZOLAM 0.5 MG PO TABS: 0.5000 mg | ORAL_TABLET | Freq: Three times a day (TID) | ORAL | 1 refills | Status: DC | PRN

## 2019-03-15 NOTE — Telephone Encounter (Signed)
I;ve changed it on med list

## 2019-03-15 NOTE — Telephone Encounter (Signed)
It is supposed to be taken as needed only and can not be taken with tramadol.  Ideally she should not be taking xanax tid regularly---- if she is still having back pain we may need to see her in office and decide of wit need more imaging  I did look at the xanax prescriptions and saw that it was changed a while back to #60 pills from 90-----  I just refilled it based on the last rx which was #60  We can change the next one to 90 but we need to cancel tramadol and she can take any other controlled sub wilth it either--- does she need to see ortho for her back?

## 2019-03-15 NOTE — Telephone Encounter (Signed)
Copied from [place] [phone]. Topic: Quick Communication - Rx Refill/Question >> Mar 15, 2019 10:07 AM Celene Kras A wrote: Medication: ALPRAZolam [name] 0.5 MG tablet  Has the patient contacted their pharmacy? Yes.  Pt  states th medication that was sent into the pharmacy is not what she is used to and she is requesting to know why. A request was sent in for a 20 day supply Instead of  a 30 day supply.  (Agent: If no, request that the patient contact the pharmacy for the refill.) (Agent: If yes, when and what did the pharmacy advise?)  Preferred Pharmacy (with phone number or street name):[id number] [address] [place] [address] Phone: [phone] Fax: [phone] Not a 24 hour pharmacy; exact hours not known.    Agent: Please be advised that RX refills may take up to 3 business days. We ask that you follow-up with your pharmacy.

## 2019-03-15 NOTE — Telephone Encounter (Signed)
Notified pt below. She states her back is feeling better and she is trying not to take any of the muscle relaxers and does not like to take Tramadol. Pt states she has had anxiety since 2008 and with the pandemic and recent back pain her anxiety has been worse which is why she was asking for refill and wanted to know why quantity had been lowered. Pt does not feel like referral or additional appt is needed currently for her back pain since it seems better. Anxiety is higher and she wants to know if she can continue to take the Xanax three times a day and request refill when RX we just sent is completed?

## 2019-03-16 NOTE — Telephone Encounter (Signed)
Notified pt and she voices understanding. 

## 2019-03-26 ENCOUNTER — Ambulatory Visit (INDEPENDENT_AMBULATORY_CARE_PROVIDER_SITE_OTHER): Payer: Medicare Other | Admitting: *Deleted

## 2019-03-26 ENCOUNTER — Other Ambulatory Visit: Payer: Self-pay

## 2019-03-26 DIAGNOSIS — I495 Sick sinus syndrome: Secondary | ICD-10-CM | POA: Diagnosis not present

## 2019-03-26 DIAGNOSIS — I5032 Chronic diastolic (congestive) heart failure: Secondary | ICD-10-CM

## 2019-03-27 ENCOUNTER — Telehealth: Payer: Self-pay

## 2019-03-27 NOTE — Telephone Encounter (Signed)
Spoke with patient to remind of missed remote transmission 

## 2019-03-28 LAB — CUP PACEART REMOTE DEVICE CHECK
Battery Impedance: 350 Ohm
Battery Remaining Longevity: 118 mo
Battery Voltage: 2.79 V
Brady Statistic AP VP Percent: 2 %
Brady Statistic AP VS Percent: 91 %
Brady Statistic AS VP Percent: 0 %
Brady Statistic AS VS Percent: 7 %
Date Time Interrogation Session: 20200429142731
Implantable Lead Implant Date: 20071016
Implantable Lead Implant Date: 20071016
Implantable Lead Location: 753859
Implantable Lead Location: 753860
Implantable Lead Model: 4092
Implantable Lead Model: 5594
Implantable Pulse Generator Implant Date: 20141001
Lead Channel Impedance Value: 356 Ohm
Lead Channel Impedance Value: 609 Ohm
Lead Channel Pacing Threshold Amplitude: 0.625 V
Lead Channel Pacing Threshold Amplitude: 0.625 V
Lead Channel Pacing Threshold Pulse Width: 0.4 ms
Lead Channel Pacing Threshold Pulse Width: 0.4 ms
Lead Channel Setting Pacing Amplitude: 1.5 V
Lead Channel Setting Pacing Amplitude: 2 V
Lead Channel Setting Pacing Pulse Width: 0.4 ms
Lead Channel Setting Sensing Sensitivity: 5.6 mV

## 2019-04-04 NOTE — Progress Notes (Signed)
Remote pacemaker transmission.   

## 2019-04-14 ENCOUNTER — Other Ambulatory Visit: Payer: Self-pay | Admitting: Family Medicine

## 2019-05-01 ENCOUNTER — Other Ambulatory Visit: Payer: Self-pay | Admitting: Family Medicine

## 2019-05-01 DIAGNOSIS — F419 Anxiety disorder, unspecified: Secondary | ICD-10-CM

## 2019-05-02 NOTE — Telephone Encounter (Signed)
Last written: 03/15/19 Last ov: 02/28/19  Next ov: none Contract: need  UDS: need

## 2019-05-07 ENCOUNTER — Other Ambulatory Visit: Payer: Self-pay | Admitting: Family Medicine

## 2019-05-07 ENCOUNTER — Telehealth: Payer: Self-pay | Admitting: Family Medicine

## 2019-05-07 DIAGNOSIS — F419 Anxiety disorder, unspecified: Secondary | ICD-10-CM

## 2019-05-07 MED ORDER — ALPRAZOLAM 0.5 MG PO TABS: 0.5000 mg | ORAL_TABLET | Freq: Three times a day (TID) | ORAL | 0 refills | Status: DC | PRN

## 2019-05-07 NOTE — Telephone Encounter (Signed)
filled

## 2019-05-07 NOTE — Telephone Encounter (Signed)
Copied from [place] [id number]. Topic: Quick Communication - Rx Refill/Question >> May 07, 2019 12:19 PM Margot Ables wrote: Medication: alprazolam Pt states she is under enormous stress and has an upcoming surgery - she said she has discussed with Dr. Carollee Herter the RX being increased to #90 for 30 days supply. Pt asking that this be increased for #90 doses. Pt notes she has seen Dr. Carollee Herter for years regarding her situation. Can this perhaps be sent in next time so she can refill approx 20 days out (last was filled on 05/02/2019)?  Has the patient contacted their pharmacy? yes  Preferred Pharmacy (with phone number or street name): [id number] [address], [place] [phone] (Phone) [phone] (Fax)

## 2019-05-07 NOTE — Telephone Encounter (Signed)
Requesting: Alprazolam Contract: N/A UDS:10/27/2017 Last OV: 02/28/2019 Next OV: N/A Last Refill: 05/02/2019, #60-- 0 RF Database:   Please read below.

## 2019-05-28 NOTE — Telephone Encounter (Signed)
That's what we/ve been filling---- did the quant change?

## 2019-05-28 NOTE — Telephone Encounter (Signed)
Pt would like to hear from the doctor to know why she is only getting 20 days of this medication at time. Pt is very upset about this.  Pt can be reached at [phone].

## 2019-05-31 NOTE — Telephone Encounter (Signed)
Left message to return call 

## 2019-06-05 ENCOUNTER — Encounter: Payer: Self-pay | Admitting: Family Medicine

## 2019-06-05 ENCOUNTER — Other Ambulatory Visit: Payer: Self-pay | Admitting: Cardiothoracic Surgery

## 2019-06-05 DIAGNOSIS — I7101 Dissection of thoracic aorta: Secondary | ICD-10-CM

## 2019-06-05 DIAGNOSIS — I71019 Dissection of thoracic aorta, unspecified: Secondary | ICD-10-CM

## 2019-06-06 ENCOUNTER — Other Ambulatory Visit: Payer: Self-pay | Admitting: Family Medicine

## 2019-06-06 ENCOUNTER — Other Ambulatory Visit: Payer: Self-pay | Admitting: Internal Medicine

## 2019-06-06 DIAGNOSIS — F419 Anxiety disorder, unspecified: Secondary | ICD-10-CM

## 2019-06-06 NOTE — Telephone Encounter (Signed)
Requesting: Xanax Contract: 02/21/2013 UDS: 10/27/2017, low risk, next screen 10/27/18 Last OV: 02/28/2019 Next OV: N/A  Last Refill: 05/07/2019, #60--0 RF Database:  **Pt requests 90 day supply**  Please advise

## 2019-06-07 ENCOUNTER — Other Ambulatory Visit: Payer: Self-pay | Admitting: Internal Medicine

## 2019-06-07 DIAGNOSIS — IMO0002 Reserved for concepts with insufficient information to code with codable children: Secondary | ICD-10-CM

## 2019-06-07 DIAGNOSIS — E1151 Type 2 diabetes mellitus with diabetic peripheral angiopathy without gangrene: Secondary | ICD-10-CM

## 2019-06-22 ENCOUNTER — Other Ambulatory Visit: Payer: Self-pay | Admitting: Family Medicine

## 2019-06-22 DIAGNOSIS — E785 Hyperlipidemia, unspecified: Secondary | ICD-10-CM

## 2019-06-22 MED ORDER — FENOFIBRATE 160 MG PO TABS: ORAL_TABLET | ORAL | 5 refills | Status: DC

## 2019-06-22 NOTE — Telephone Encounter (Signed)
Pt is calling and would like fenofibrate 160 mg #30 with refills. [facility] [address]

## 2019-06-22 NOTE — Addendum Note (Signed)
Addended by: Wynonia Musty A on: 06/22/2019 09:51 AM   Modules accepted: Orders

## 2019-06-25 ENCOUNTER — Encounter: Payer: Medicare Other | Admitting: *Deleted

## 2019-06-26 ENCOUNTER — Telehealth: Payer: Self-pay

## 2019-06-26 NOTE — Telephone Encounter (Signed)
Left message for patient to remind of missed remote transmission.  

## 2019-06-27 ENCOUNTER — Emergency Department (HOSPITAL_COMMUNITY): Payer: Medicare HMO

## 2019-06-27 ENCOUNTER — Other Ambulatory Visit: Payer: Self-pay

## 2019-06-27 ENCOUNTER — Inpatient Hospital Stay (HOSPITAL_COMMUNITY)
Admission: EM | Admit: 2019-06-27 | Discharge: 2019-06-29 | DRG: 227 | Disposition: A | Discharge status: Home / Self Care | Payer: Medicare HMO | Attending: Internal Medicine | Admitting: Internal Medicine

## 2019-06-27 ENCOUNTER — Encounter (HOSPITAL_COMMUNITY): Payer: Self-pay | Admitting: Emergency Medicine

## 2019-06-27 DIAGNOSIS — E1165 Type 2 diabetes mellitus with hyperglycemia: Secondary | ICD-10-CM | POA: Diagnosis not present

## 2019-06-27 DIAGNOSIS — I351 Nonrheumatic aortic (valve) insufficiency: Secondary | ICD-10-CM | POA: Diagnosis present

## 2019-06-27 DIAGNOSIS — R7989 Other specified abnormal findings of blood chemistry: Secondary | ICD-10-CM

## 2019-06-27 DIAGNOSIS — E1151 Type 2 diabetes mellitus with diabetic peripheral angiopathy without gangrene: Secondary | ICD-10-CM | POA: Diagnosis present

## 2019-06-27 DIAGNOSIS — I77810 Thoracic aortic ectasia: Secondary | ICD-10-CM | POA: Diagnosis not present

## 2019-06-27 DIAGNOSIS — D649 Anemia, unspecified: Secondary | ICD-10-CM | POA: Diagnosis present

## 2019-06-27 DIAGNOSIS — Z950 Presence of cardiac pacemaker: Secondary | ICD-10-CM

## 2019-06-27 DIAGNOSIS — Y92003 Bedroom of unspecified non-institutional (private) residence as the place of occurrence of the external cause: Secondary | ICD-10-CM

## 2019-06-27 DIAGNOSIS — I5032 Chronic diastolic (congestive) heart failure: Secondary | ICD-10-CM | POA: Diagnosis present

## 2019-06-27 DIAGNOSIS — Z87891 Personal history of nicotine dependence: Secondary | ICD-10-CM

## 2019-06-27 DIAGNOSIS — K219 Gastro-esophageal reflux disease without esophagitis: Secondary | ICD-10-CM | POA: Diagnosis present

## 2019-06-27 DIAGNOSIS — Z8249 Family history of ischemic heart disease and other diseases of the circulatory system: Secondary | ICD-10-CM | POA: Diagnosis not present

## 2019-06-27 DIAGNOSIS — S199XXA Unspecified injury of neck, initial encounter: Secondary | ICD-10-CM | POA: Diagnosis not present

## 2019-06-27 DIAGNOSIS — S299XXA Unspecified injury of thorax, initial encounter: Secondary | ICD-10-CM | POA: Diagnosis not present

## 2019-06-27 DIAGNOSIS — I428 Other cardiomyopathies: Secondary | ICD-10-CM | POA: Diagnosis present

## 2019-06-27 DIAGNOSIS — I472 Ventricular tachycardia, unspecified: Secondary | ICD-10-CM

## 2019-06-27 DIAGNOSIS — Z20828 Contact with and (suspected) exposure to other viral communicable diseases: Secondary | ICD-10-CM | POA: Diagnosis present

## 2019-06-27 DIAGNOSIS — Z841 Family history of disorders of kidney and ureter: Secondary | ICD-10-CM | POA: Diagnosis not present

## 2019-06-27 DIAGNOSIS — R778 Other specified abnormalities of plasma proteins: Secondary | ICD-10-CM | POA: Diagnosis present

## 2019-06-27 DIAGNOSIS — Z95810 Presence of automatic (implantable) cardiac defibrillator: Secondary | ICD-10-CM

## 2019-06-27 DIAGNOSIS — I350 Nonrheumatic aortic (valve) stenosis: Secondary | ICD-10-CM | POA: Diagnosis present

## 2019-06-27 DIAGNOSIS — Z7901 Long term (current) use of anticoagulants: Secondary | ICD-10-CM | POA: Diagnosis not present

## 2019-06-27 DIAGNOSIS — I7101 Dissection of thoracic aorta: Secondary | ICD-10-CM | POA: Diagnosis not present

## 2019-06-27 DIAGNOSIS — S0011XA Contusion of right eyelid and periocular area, initial encounter: Secondary | ICD-10-CM | POA: Diagnosis present

## 2019-06-27 DIAGNOSIS — N200 Calculus of kidney: Secondary | ICD-10-CM | POA: Diagnosis not present

## 2019-06-27 DIAGNOSIS — I110 Hypertensive heart disease with heart failure: Secondary | ICD-10-CM | POA: Diagnosis present

## 2019-06-27 DIAGNOSIS — W19XXXA Unspecified fall, initial encounter: Secondary | ICD-10-CM | POA: Diagnosis not present

## 2019-06-27 DIAGNOSIS — Z7984 Long term (current) use of oral hypoglycemic drugs: Secondary | ICD-10-CM | POA: Diagnosis not present

## 2019-06-27 DIAGNOSIS — R55 Syncope and collapse: Secondary | ICD-10-CM

## 2019-06-27 DIAGNOSIS — K5730 Diverticulosis of large intestine without perforation or abscess without bleeding: Secondary | ICD-10-CM | POA: Diagnosis not present

## 2019-06-27 DIAGNOSIS — E611 Iron deficiency: Secondary | ICD-10-CM | POA: Diagnosis present

## 2019-06-27 DIAGNOSIS — I4892 Unspecified atrial flutter: Secondary | ICD-10-CM

## 2019-06-27 DIAGNOSIS — R51 Headache: Secondary | ICD-10-CM | POA: Diagnosis not present

## 2019-06-27 DIAGNOSIS — W1839XA Other fall on same level, initial encounter: Secondary | ICD-10-CM | POA: Diagnosis present

## 2019-06-27 DIAGNOSIS — J9811 Atelectasis: Secondary | ICD-10-CM | POA: Diagnosis not present

## 2019-06-27 DIAGNOSIS — R0902 Hypoxemia: Secondary | ICD-10-CM | POA: Diagnosis not present

## 2019-06-27 DIAGNOSIS — I959 Hypotension, unspecified: Secondary | ICD-10-CM | POA: Diagnosis not present

## 2019-06-27 DIAGNOSIS — I10 Essential (primary) hypertension: Secondary | ICD-10-CM | POA: Diagnosis not present

## 2019-06-27 DIAGNOSIS — Z6835 Body mass index (BMI) 35.0-35.9, adult: Secondary | ICD-10-CM | POA: Diagnosis not present

## 2019-06-27 DIAGNOSIS — E785 Hyperlipidemia, unspecified: Secondary | ICD-10-CM | POA: Diagnosis present

## 2019-06-27 DIAGNOSIS — IMO0002 Reserved for concepts with insufficient information to code with codable children: Secondary | ICD-10-CM | POA: Diagnosis present

## 2019-06-27 DIAGNOSIS — I2510 Atherosclerotic heart disease of native coronary artery without angina pectoris: Secondary | ICD-10-CM | POA: Diagnosis present

## 2019-06-27 DIAGNOSIS — E119 Type 2 diabetes mellitus without complications: Secondary | ICD-10-CM | POA: Diagnosis present

## 2019-06-27 DIAGNOSIS — I71019 Dissection of thoracic aorta, unspecified: Secondary | ICD-10-CM | POA: Diagnosis present

## 2019-06-27 DIAGNOSIS — R4020 Unspecified coma: Secondary | ICD-10-CM | POA: Diagnosis not present

## 2019-06-27 DIAGNOSIS — I517 Cardiomegaly: Secondary | ICD-10-CM | POA: Diagnosis not present

## 2019-06-27 LAB — URINALYSIS, ROUTINE W REFLEX MICROSCOPIC
Bacteria, UA: NONE SEEN
Bilirubin Urine: NEGATIVE
Glucose, UA: >=500 mg/dL — AB
Ketones, ur: NEGATIVE mg/dL
Leukocytes,Ua: NEGATIVE
Nitrite: NEGATIVE
Protein, ur: 30 mg/dL — AB
Specific Gravity, Urine: 1.018 (ref 1.005–1.030)
pH: 5 (ref 5.0–8.0)

## 2019-06-27 LAB — BASIC METABOLIC PANEL
Anion gap: 9 (ref 5–15)
BUN: 20 mg/dL (ref 8–23)
CO2: 21 mmol/L — ABNORMAL LOW (ref 22–32)
Calcium: 8.5 mg/dL — ABNORMAL LOW (ref 8.9–10.3)
Chloride: 103 mmol/L (ref 98–111)
Creatinine, Ser: 1.06 mg/dL — ABNORMAL HIGH (ref 0.44–1.00)
GFR calc Af Amer: >60 mL/min (ref >60)
GFR calc non Af Amer: 52 mL/min — ABNORMAL LOW (ref >60)
Glucose, Bld: 147 mg/dL — ABNORMAL HIGH (ref 70–99)
Potassium: 4.1 mmol/L (ref 3.5–5.1)
Sodium: 133 mmol/L — ABNORMAL LOW (ref 135–145)

## 2019-06-27 LAB — SARS CORONAVIRUS 2 BY RT PCR (HOSPITAL ORDER, PERFORMED IN ~~LOC~~ HOSPITAL LAB): SARS Coronavirus 2: NEGATIVE

## 2019-06-27 LAB — CBC
HCT: 30.1 % — ABNORMAL LOW (ref 36.0–46.0)
Hemoglobin: 9.1 g/dL — ABNORMAL LOW (ref 12.0–15.0)
MCH: 27.8 pg (ref 26.0–34.0)
MCHC: 30.2 g/dL (ref 30.0–36.0)
MCV: 92 fL (ref 80.0–100.0)
Platelets: 139 10*3/uL — ABNORMAL LOW (ref 150–400)
RBC: 3.27 MIL/uL — ABNORMAL LOW (ref 3.87–5.11)
RDW: 18.2 % — ABNORMAL HIGH (ref 11.5–15.5)
WBC: 3.9 10*3/uL — ABNORMAL LOW (ref 4.0–10.5)
nRBC: 0 % (ref 0.0–0.2)

## 2019-06-27 LAB — TROPONIN I (HIGH SENSITIVITY)
Troponin I (High Sensitivity): 44 ng/L — ABNORMAL HIGH (ref <18)
Troponin I (High Sensitivity): 40 ng/L — ABNORMAL HIGH (ref <18)

## 2019-06-27 LAB — CBG MONITORING, ED: Glucose-Capillary: 109 mg/dL — ABNORMAL HIGH (ref 70–99)

## 2019-06-27 LAB — POC OCCULT BLOOD, ED: Fecal Occult Bld: NEGATIVE

## 2019-06-27 LAB — CK: Total CK: 39 U/L (ref 38–234)

## 2019-06-27 MED ORDER — CITALOPRAM HYDROBROMIDE 20 MG PO TABS
20.0000 mg | ORAL_TABLET | Freq: Every day | ORAL | Status: DC
  Administered 2019-06-27 – 2019-06-29 (×3): 20 mg via ORAL
  Filled 2019-06-27: qty 1
  Filled 2019-06-27: qty 2

## 2019-06-27 MED ORDER — SODIUM CHLORIDE 0.9 % IV BOLUS
500.0000 mL | Freq: Once | INTRAVENOUS | Status: AC
  Administered 2019-06-27: 500 mL via INTRAVENOUS

## 2019-06-27 MED ORDER — ALPRAZOLAM 0.25 MG PO TABS
1.0000 mg | ORAL_TABLET | Freq: Once | ORAL | Status: AC
  Administered 2019-06-27: 1 mg via ORAL
  Filled 2019-06-27: qty 4

## 2019-06-27 MED ORDER — SODIUM CHLORIDE 0.9% FLUSH
3.0000 mL | Freq: Once | INTRAVENOUS | Status: AC
  Administered 2019-06-28: 3 mL via INTRAVENOUS

## 2019-06-27 MED ORDER — MECLIZINE HCL 25 MG PO TABS
25.0000 mg | ORAL_TABLET | Freq: Once | ORAL | Status: AC
  Administered 2019-06-27: 25 mg via ORAL
  Filled 2019-06-27: qty 1

## 2019-06-27 NOTE — ED Triage Notes (Signed)
Pt arrives via ems after having a fall in her bedroom that doesnt exactly remember she knows she took a shower and was walking into her room and she woke up on the floor. Pt stayed on the floor until ems got there. Pt is on blood thinner and has a pacemake. Pt has headache and hematoma on right side of head above eye.

## 2019-06-27 NOTE — Consult Note (Signed)
Cardiology Consultation   Patient ID: [name]; [id number]; 15-Jan-1946   Admit date: 06/27/2019 Date of Consult: 06/27/2019  Referring Provider:  Darl Householder [facility])  Primary Care Provider: Carollee Herter, Alferd Apa, DO Cardiologist: [place] Electrophysiologist:  NA  Reason for Consultation: syncope, possible VT  History of Present Illness: [name] is a 72 y.o. female who is being seen today for the evaluation of syncope and possible VT at the request of Dr. Darl Householder with the [facility] ED.  Pt has h/o chronic diastolic HF w/ newly reduced LVEF diagnosed in Dec 2019 (EF 35% 10-29-18), felt to be due either to chronic RV pacing vs possible Takotsubo as she had essentially normal cors on cath in Dec 2019. She has h/o SSS s/p PPM placement in 2007 w/ subsequent generator exchange in 2014.  She was last seen by cardiology in Feb '20, by Dr. Haroldine Laws, at which time he repeated a TTE which showed EF back up to 50-55%. He added losartan 25 to her regimen of coreg 25mg  bid and lasix 40mg  daily, along with jardiance 10mg  daily. He discharged her from HF care back to [facility].  She has h/o AS, which was felt in the fall to be severe (low flow/low gradient), although degree of AS was only moderate w/ EF 50-55% on TTE in Feb 2020. She is in process of being worked up for AVR. She has not been seen again by cardiology since that visit and has been doing OK until today. Pt tells me that she was in the shower, got out, and suddenly found herself laying on the floor. She does not recall feeling funny before the event, but was confused with a HA when she regained consciousness. She has no prior h/o syncope. She continues to feel a little dizzy with a generalized weakness. No focal neuro complaints.  She has not been having any recent palpitations or feeling of her heart racing. No resting or exertional chest discomfort, SOB, or any other significant cardiac sx.   Past Medical History:  Diagnosis Date   Acute thoracic  aortic dissection [facility]) 2008   emergency surgery - [facility]   Anxiety    Cancer [facility])    skin   Diabetes mellitus    Diverticulosis    GERD (gastroesophageal reflux disease)    Hemorrhoids    Hyperlipidemia    Hypertension    Kidney stones    Sinus node dysfunction ([place])    hx of PPM    Past Surgical History:  Procedure Laterality Date   ABDOMINAL HYSTERECTOMY  1984   APPENDECTOMY     EYE SURGERY  2014   Rght eye   PACEMAKER GENERATOR CHANGE  2014   Medtronic adapta   PACEMAKER GENERATOR CHANGE N/A 08/29/2013   Procedure: PACEMAKER GENERATOR CHANGE;  Surgeon: Sanda Klein, MD;  Location: [facility] CATH LAB;  Service: Cardiovascular;  Laterality: N/A;   PACEMAKER INSERTION  08/2006   dual chamber Medtronic EnRhythm; r/t sinus node dysfunction    REPAIR OF ACUTE ASCENDING THORACIC AORTIC DISSECTION  2008   Dr. Servando Snare   THORACIC AORTIC ANEURYSM REPAIR  2000   type III   TRANSTHORACIC ECHOCARDIOGRAM  09/20/2012   EF=>55% with mild conc LVH; LA mod dilated; RA mildly dilated; mild MR/TR/AR      Current Medications:  diclofenac sodium  2 g Topical QID   meclizine  25 mg Oral Once   sodium chloride flush  3 mL Intravenous Once    Infused Medications:  sodium chloride  PRN Medications:    Allergies:    Allergies  Allergen Reactions   Oxycodone Itching   Vicodin [Hydrocodone-Acetaminophen] Itching   Neomycin-Bacitracin Zn-Polymyx Other (See Comments)    Red , swollen eye   Sulfonamide Derivatives Other (See Comments)    Just had reaction when taking during pregnancy- last child birth 88    Social History:   The patient  reports that she quit smoking about 25 years ago. She has never used smokeless tobacco. She reports that she does not drink alcohol or use drugs.    Family History:   The patient's family history includes Heart attack in her father and maternal grandmother; Kidney disease in her mother; Valvular heart disease in her son.    ROS:  Please see the history of present illness.  All other ROS reviewed and negative.     Vital Signs: Blood pressure (!) 153/56, pulse (!) 56, temperature 97.8 F (36.6 C), temperature source Oral, resp. rate 20, SpO2 94 %.   PHYSICAL EXAM: General:  Well nourished, well developed, in no acute distress HEENT: normal Lymph: no adenopathy Neck: no JVD Endocrine:  No thryomegaly Vascular: No carotid bruits; DP pulses 2+ bilaterally  Cardiac:  normal S1, S2; RRR; 9-3/8 systolic murmur at the base audible through the precordium  Lungs:  clear to auscultation bilaterally, no wheezing, rhonchi or rales  Abd: soft, nontender, no hepatomegaly  Ext: no edema Musculoskeletal:  No deformities, BUE and BLE strength normal and equal Skin: warm and dry  Neuro:  CNs 2-12 intact, no focal abnormalities noted Psych:  Normal affect   EKG:  A-paced, V-sensed, LBBB  Labs: No results for input(s): CKTOTAL, CKMB, TROPONINI in the last 72 hours. No results for input(s): TROPIPOC in the last 72 hours.  Lab Results  Component Value Date   WBC 3.9 (L) 06/27/2019   HGB 9.1 (L) 06/27/2019   HCT 30.1 (L) 06/27/2019   MCV 92.0 06/27/2019   PLT 139 (L) 06/27/2019   Recent Labs  Lab 06/27/19 1815  NA 133*  K 4.1  CL 103  CO2 21*  BUN 20  CREATININE 1.06*  CALCIUM 8.5*  GLUCOSE 147*   Lab Results  Component Value Date   CHOL 143 06/28/2017   HDL 51.40 06/28/2017   LDLCALC 70 06/28/2017   TRIG 107.0 06/28/2017   Lab Results  Component Value Date   DDIMER 1.23 (H) 06/18/2011    Radiology/Studies:  Ct Head Wo Contrast  Result Date: 06/27/2019 CLINICAL DATA:  Head trauma, headache, anticoagulation EXAM: CT HEAD WITHOUT CONTRAST TECHNIQUE: Contiguous axial images were obtained from the base of the skull through the vertex without intravenous contrast. COMPARISON:  02/09/2013 FINDINGS: Brain: No evidence of acute infarction, hemorrhage, hydrocephalus, extra-axial collection or mass  lesion/mass effect. Mild periventricular white matter hypodensity. Vascular: No hyperdense vessel or unexpected calcification. Skull: Normal. Negative for fracture or focal lesion. Sinuses/Orbits: No acute finding. Other: Soft tissue hematoma and laceration of the right forehead. IMPRESSION: 1.  No acute intracranial pathology. 2.  Soft tissue hematoma and laceration of the right forehead. Electronically Signed   By: Eddie Candle M.D.   On: 06/27/2019 20:00   Echo 11/2017 LVEF 55-60%, Grade 1 DD, Peak and Mean gradients through AV are 40 and 24 mm Hg respectively consistent with moderate AS.   Echo 12/2015 LVEF 55-60%, Grade 1 DD, Mild AS, Mod LAE, Mild RAE.   [facility] 11/02/18 ([facility]) - No angiographic evidence of significant disease.  RA mean 6 RV 45/10  PA 45/16 (26) PCWP 12 AO 90% Cardiac Output (Fick) 4.6 Cardiac Index (Fick) 2.4 SVR 1443  PVR 3 WU LVEDP 18 Mean AV gradient: 21.2 mmHg  Peak to peak gradient: 16 Valve area (gorlin): 1.1 cm2 (index 0.6)  TTE 01-10-19 1. The left ventricle has low normal systolic function, with an ejection fraction of 50-55%. The cavity size was normal. There is mildly increased left ventricular wall thickness. Left ventricular diastolic Doppler parameters are consistent with  pseudonormalization.  2. The right ventricle has normal systolic function. The cavity was normal. There is no increase in right ventricular wall thickness.  3. Left atrial size was mildly dilated.  4. The tricuspid valve is normal in structure.  5. The aortic valve is tricuspid There is severe thickening and severe calcifcation of the aortic valve. Aortic valve regurgitation is moderate by color flow Doppler. There is moderate stenosis of the aortic valve, peak velocity 3.71m/s and mean  gradient 73mmHg.  6. The pulmonic valve was normal in structure.  7. There is mild dilatation at the level of the sinuses of Valsalva measuring 38 mm.  8. Right atrial pressure is estimated at 3  mmHg.  9. The mitral valve is normal in structure.  ASSESSMENT AND PLAN:  1. Syncope: etiology not entirely clear. Could be due to severe AS, although degree of AS was moderate on most recent TTE in Feb 2020. Will get repeat TTE to reassess valve. Syncope could also be due to arrhythmia; reviewing the PPM interrogation, she definitely has what looks like VT around the time of her symptoms, although it is not totally clear to me what the actual duration of the episode was. Will have EP review/verify.   2. VT: ? Possible sustained VT based on PPM interrogation showing high VR episode 2+ min in duration. E-grams do not appear to show an episode of that length (appears substantially shorter). Will have EP see her tomorrow. She has h/o normal cors in December '19; I do not think ischemia eval needs to be repeated. Would repeat TTE to reeval LVEF. If it has dropped again, and/or VT found to be sustained, there may need to be a consideration for upgrade device to ICD.  3. H/o PAF: the arrhythmia in question appears to be a ventricular rather than atrial arrhythmia based on E-grams. Cont OAC. EP to see tomorrow  4. H/o AS: rec repeat TTE as above  5. H/o NICM: rec repeat TTE as above  6. HTN/dyslipidemia: restart home med regimen and titrate as needed going forward  Thank you for the opportunity to participate in the care of this patient.  For questions or updates, please contact [place] Please consult [email or web address] for contact info under   Signed, Rudean Curt, MD, [facility]  06/27/2019 8:55 PM

## 2019-06-27 NOTE — ED Notes (Signed)
Abrasion on right eye from pt's fall. Cut noted on bottom lip

## 2019-06-27 NOTE — ED Notes (Signed)
Called about information regarding pt's pacemaker. 4 episodes of high-rate ventricular episodes since January. One episode occurring at 1515 and another episode occurring at 27 with a sustained ventricular rate of 200 for approx 3 minutes. MD informed

## 2019-06-27 NOTE — ED Provider Notes (Signed)
[facility] EMERGENCY DEPARTMENT Provider Note   CSN: [id number] Arrival date & time: 06/27/19  1746    History   Chief Complaint Chief Complaint  Patient presents with  . Loss of Consciousness    HPI [name] is a 73 y.o. female with a past medical history of diabetes, hypertension, hyperlipidemia, thoracic aortic aneurysm repair x2, pacemaker in place presents to ED for syncope.  States that she got out of the shower and was in her bedroom.  Next thing she knew, "I was half under my bed and have laying on the floor."  Reports feeling a headache and confusion when she woke up.  States that she was calling for her husband who passed away 3 years ago.  She states that this is never happened to her in the past.  She denies any prodromal symptoms.  She currently feels dizzy and generally fatigued and weak.  She denies any chest pain, vision changes, numbness in arms or legs.  She is currently anticoagulated on Xarelto.     HPI  Past Medical History:  Diagnosis Date  . Acute thoracic aortic dissection [facility]) 2008   emergency surgery - [place]  . Anxiety   . Cancer ([place])    skin  . Diabetes mellitus   . Diverticulosis   . GERD (gastroesophageal reflux disease)   . Hemorrhoids   . Hyperlipidemia   . Hypertension   . Kidney stones   . Sinus node dysfunction (H[facility])    hx of PPM    Patient Active Problem List   Diagnosis Date Noted  . Low back pain with radiation 02/28/2019  . Severe obesity (BMI 35.0-35.9 with comorbidity) ([place]) 12/08/2018  . Panic attack 11/13/2018  . Pure hypercholesterolemia 06/28/2018  . Aortic valve stenosis, nonrheumatic 06/21/2018  . Pyelonephritis 05/11/2018  . Sinus node dysfunction ([place]) 10/11/2015  . DM (diabetes mellitus) type II uncontrolled, periph vascular disorder ([place]) 04/24/2015  . Paroxysmal atrial flutter ([place]) 10/07/2013  . Pacemaker generator end of life 09/05/2013  . Chest pain at rest 09/05/2013  . Severe  obesity (BMI >= 40) ([place]) 07/30/2013  . Pacemaker - Medtronic Adapta dual chamber Sep 2014, initial implantation 2007 07/30/2013  . GERD 01/15/2011  . DIVERTICULITIS, HX OF 12/09/2010  . POSTMENOPAUSAL STATUS 07/10/2009  . NECK MASS 07/23/2008  . ACUTE BRONCHITIS 11/09/2007  . 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 Hyperlipidemia LDL goal <100 05/23/2007  . Anxiety 05/23/2007  . Essential hypertension 05/23/2007    Past Surgical History:  Procedure Laterality Date  . ABDOMINAL HYSTERECTOMY  1984  . APPENDECTOMY    . EYE SURGERY  2014   Rght eye  . PACEMAKER GENERATOR CHANGE  2014   Medtronic adapta  . PACEMAKER GENERATOR CHANGE N/A 08/29/2013   Procedure: PACEMAKER GENERATOR CHANGE;  Surgeon: Sanda Klein, MD;  Location: [facility] CATH LAB;  Service: Cardiovascular;  Laterality: N/A;  . PACEMAKER INSERTION  08/2006   dual chamber Medtronic EnRhythm; r/t sinus node dysfunction   . REPAIR OF ACUTE ASCENDING THORACIC AORTIC DISSECTION  2008   Dr. Servando Snare  . THORACIC AORTIC ANEURYSM REPAIR  2000   type III  . TRANSTHORACIC ECHOCARDIOGRAM  09/20/2012   EF=>55% with mild conc LVH; LA mod dilated; RA mildly dilated; mild MR/TR/AR     OB History   No obstetric history on file.      Home Medications  Prior to Admission medications   Medication Sig Start Date End Date Taking? Authorizing Provider  ALPRAZolam [name] 0.5 MG tablet Take 1 tablet (0.5 mg total) by mouth 3 (three) times daily as needed for anxiety. 06/06/19   Colon Branch, MD  carvedilol (COREG) 25 MG tablet TAKE 1 TABLET BY MOUTH TWICE A DAY WITH A MEAL 08/18/18   Croitoru, Mihai, MD  cholecalciferol (VITAMIN D) 1000 UNITS tablet Take 1,000 Units by mouth daily.    [provider]  citalopram (CELEXA) 20 MG tablet TAKE 3 TABLETS BY MOUTH EVERY DAY 12/07/18   Carollee Herter, Alferd Apa, DO  empagliflozin (JARDIANCE)  10 MG TABS tablet Take 10 mg by mouth daily. 02/09/19   Bensimhon, Shaune Pascal, MD  fenofibrate 160 MG tablet TAKE 1 TABLET BY MOUTH EVERY DAY 06/22/19   Carollee Herter, Alferd Apa, DO  fluticasone [facility]) 50 MCG/ACT nasal spray USE 2 SPRAYS IN [facility] NOSTIRL EVERY DAY 06/23/18   Carollee Herter, Alferd Apa, DO  furosemide (LASIX) 40 MG tablet Take 1 tablet (40 mg total) by mouth daily. 02/09/19   Roma Schanz R, DO  hyoscyamine (LEVSIN SL) 0.125 MG SL tablet TAKE 1 TABLET BY MOUTH TWICE A DAY AS NEEDED FOR CRAMPING OR DIARRHEA OR LOOSE STOOLS 07/18/17   Carollee Herter, Alferd Apa, DO  lansoprazole (PREVACID) 15 MG capsule Take 15 mg by mouth as needed (for heart burn).     [provider]  losartan (COZAAR) 25 MG tablet Take 1 tablet (25 mg total) by mouth daily. 01/10/19 04/10/19  Bensimhon, Shaune Pascal, MD  magnesium oxide (MAG-OX) 400 MG tablet Take 400 mg by mouth daily.    [provider]  Omega-3 Fatty Acids (FISH OIL) 1000 MG CAPS Take by mouth.    [provider]  Polyethyl Glycol-Propyl Glycol (SYSTANE OP) Apply 1 drop to eye 4 (four) times daily - after meals and at bedtime.    [provider]  Probiotic Product (PRO-BIOTIC BLEND PO) Take 1 tablet by mouth as needed.     [provider]  rosuvastatin (CRESTOR) 20 MG tablet TAKE 1 TABLET BY MOUTH EVERY DAY 04/16/19   Carollee Herter, Alferd Apa, DO  sitaGLIPtin (JANUVIA) 100 MG tablet Take 1 tablet (100 mg total) by mouth daily. NO FURTHER REFILLS WITHOUT APPOINTMENT 06/07/19   Philemon Kingdom, MD  tiZANidine (ZANAFLEX) 4 MG tablet Take 1 tablet (4 mg total) by mouth every 6 (six) hours as needed for muscle spasms. 03/07/19   [name], DO  traMADol (ULTRAM) 50 MG tablet TAKE 1 TO 2 TABLETS BY MOUTH EVERY 6 HOURS AS NEEDED 03/07/19   Lowne Chase, Yvonne R, DO  XARELTO 20 MG TABS tablet TAKE 1 TABLET BY MOUTH EVERY DAY WITH SUPPER 11/13/18   Croitoru, Mihai, MD    Family History Family History  Problem Relation  Age of Onset  . Kidney disease Mother   . Heart attack Father   . Heart attack Maternal Grandmother   . Valvular heart disease Son        valve replacement at 82  . Cancer Neg Hx     Social History Social History   Tobacco Use  . Smoking status: Former Smoker    Quit date: 09/04/1993    Years since quitting: 25.8  . Smokeless tobacco: Never Used  Substance Use Topics  . Alcohol use: No  . Drug use: No     Allergies   Oxycodone, Vicodin [hydrocodone-acetaminophen], Neomycin-bacitracin zn-polymyx, and  Sulfonamide derivatives   Review of Systems Review of Systems  Constitutional: Negative for appetite change, chills and fever.  HENT: Negative for ear pain, rhinorrhea, sneezing and sore throat.   Eyes: Negative for photophobia and visual disturbance.  Respiratory: Negative for cough, chest tightness, shortness of breath and wheezing.   Cardiovascular: Negative for chest pain and palpitations.  Gastrointestinal: Negative for abdominal pain, blood in stool, constipation, diarrhea, nausea and vomiting.  Genitourinary: Negative for dysuria, hematuria and urgency.  Musculoskeletal: Negative for myalgias.  Skin: Negative for rash.  Neurological: Positive for dizziness, syncope and headaches. Negative for weakness and light-headedness.     Physical Exam Updated Vital Signs BP (!) 153/56 (BP Location: Right Arm)   Pulse (!) 55   Temp 97.8 F (36.6 C) (Oral)   Resp (!) 23   SpO2 93%   Physical Exam Vitals signs and nursing note reviewed.  Constitutional:      General: She is not in acute distress.    Appearance: She is well-developed.  HENT:     Head: Normocephalic and atraumatic.     Nose: Nose normal.  Eyes:     General: No scleral icterus.       Right eye: No discharge.        Left eye: No discharge.     Conjunctiva/sclera: Conjunctivae normal.     Pupils: Pupils are equal, round, and reactive to light.  Neck:     Musculoskeletal: Normal range of motion and neck  supple.  Cardiovascular:     Rate and Rhythm: Normal rate and regular rhythm.     Heart sounds: Normal heart sounds. No murmur. No friction rub. No gallop.   Pulmonary:     Effort: Pulmonary effort is normal. No respiratory distress.     Breath sounds: Normal breath sounds.  Abdominal:     General: Bowel sounds are normal. There is no distension.     Palpations: Abdomen is soft.     Tenderness: There is no abdominal tenderness. There is no guarding.  Musculoskeletal: Normal range of motion.  Skin:    General: Skin is warm and dry.     Findings: No rash.     Comments: Soft tissue swelling to R eyebrow.  Neurological:     General: No focal deficit present.     Mental Status: She is alert and oriented to person, place, and time.     Cranial Nerves: No cranial nerve deficit.     Sensory: No sensory deficit.     Motor: No weakness or abnormal muscle tone.     Coordination: Coordination normal.     Comments: Pupils reactive. No facial asymmetry noted. Cranial nerves appear grossly intact. Sensation intact to light touch on face, BUE and BLE. Strength 5/5 in BUE and BLE.      ED Treatments / Results  Labs (all labs ordered are listed, but only abnormal results are displayed) Labs Reviewed  BASIC METABOLIC PANEL - Abnormal; Notable for the following components:      Result Value   Sodium 133 (*)    CO2 21 (*)    Glucose, Bld 147 (*)    Creatinine, Ser 1.06 (*)    Calcium 8.5 (*)    GFR calc non Af Amer 52 (*)    All other components within normal limits  CBC - Abnormal; Notable for the following components:   WBC 3.9 (*)    RBC 3.27 (*)    Hemoglobin 9.1 (*)    HCT  30.1 (*)    RDW 18.2 (*)    Platelets 139 (*)    All other components within normal limits  URINALYSIS, ROUTINE W REFLEX MICROSCOPIC - Abnormal; Notable for the following components:   Color, Urine AMBER (*)    APPearance HAZY (*)    Glucose, UA >=500 (*)    Hgb urine dipstick MODERATE (*)    Protein, ur 30  (*)    All other components within normal limits  CBG MONITORING, ED - Abnormal; Notable for the following components:   Glucose-Capillary 109 (*)    All other components within normal limits  TROPONIN I (HIGH SENSITIVITY) - Abnormal; Notable for the following components:   Troponin I (High Sensitivity) 44 (*)    All other components within normal limits  URINE CULTURE  SARS CORONAVIRUS 2 (HOSPITAL ORDER, [facility])  CK  TROPONIN I (HIGH SENSITIVITY)    EKG EKG Interpretation  Date/Time:  Wednesday June 27 2019 18:02:26 EDT Ventricular Rate:  55 PR Interval:  338 QRS Duration: 138 QT Interval:  504 QTC Calculation: 482 R Axis:   -26 Text Interpretation:  Atrial-paced rhythm with prolonged AV conduction Left bundle branch block Abnormal ECG Confirmed by Pattricia Boss [id number]) on 06/27/2019 6:06:29 PM   Radiology Dg Chest 2 View  Result Date: 06/27/2019 CLINICAL DATA:  Fall in her bedroom. EXAM: CHEST - 2 VIEW COMPARISON:  Radiograph 06/18/2011, chest CT 01/27/2017 FINDINGS: Post median sternotomy. Left-sided pacemaker in place. Mild cardiomegaly with unchanged mediastinal contours. No acute airspace disease, pleural effusion, or pneumothorax. No acute osseous abnormalities are seen. IMPRESSION: Mild cardiomegaly without acute abnormality. Electronically Signed   By: Keith Rake M.D.   On: 06/27/2019 22:03   Ct Head Wo Contrast  Result Date: 06/27/2019 CLINICAL DATA:  Head trauma, headache, anticoagulation EXAM: CT HEAD WITHOUT CONTRAST TECHNIQUE: Contiguous axial images were obtained from the base of the skull through the vertex without intravenous contrast. COMPARISON:  02/09/2013 FINDINGS: Brain: No evidence of acute infarction, hemorrhage, hydrocephalus, extra-axial collection or mass lesion/mass effect. Mild periventricular white matter hypodensity. Vascular: No hyperdense vessel or unexpected calcification. Skull: Normal. Negative for fracture or  focal lesion. Sinuses/Orbits: No acute finding. Other: Soft tissue hematoma and laceration of the right forehead. IMPRESSION: 1.  No acute intracranial pathology. 2.  Soft tissue hematoma and laceration of the right forehead. Electronically Signed   By: Eddie Candle M.D.   On: 06/27/2019 20:00   Ct Cervical Spine Wo Contrast  Result Date: 06/27/2019 CLINICAL DATA:  Fall, headache EXAM: CT CERVICAL SPINE WITHOUT CONTRAST TECHNIQUE: Multidetector CT imaging of the cervical spine was performed without intravenous contrast. Multiplanar CT image reconstructions were also generated. COMPARISON:  None. FINDINGS: Alignment: Normal cervical lordosis. Skull base and vertebrae: No acute fracture. No primary bone lesion or focal pathologic process. Soft tissues and spinal canal: No prevertebral fluid or swelling. No visible canal hematoma. Disc levels: Mild degenerative changes of the mid/lower cervical spine. Spinal canal is patent. Upper chest: Visualized lung apices are clear. Other: Visualized thyroid is unremarkable. IMPRESSION: No evidence of traumatic injury to the cervical spine. Mild degenerative changes. Electronically Signed   By: Julian Hy M.D.   On: 06/27/2019 21:09    Procedures .Critical Care Performed by: Delia Heady, PA-C Authorized by: Delia Heady, PA-C   Critical care provider statement:    Critical care time (minutes):  35   Critical care time was exclusive of:  Separately billable procedures and treating other patients  Critical care was necessary to treat or prevent imminent or life-threatening deterioration of the following conditions:  Circulatory failure and cardiac failure   Critical care was time spent personally by me on the following activities:  Development of treatment plan with patient or surrogate, discussions with consultants, obtaining history from patient or surrogate, examination of patient, ordering and performing treatments and interventions, ordering and review  of laboratory studies, ordering and review of radiographic studies and review of old charts   (including critical care time)  Medications Ordered in ED Medications  sodium chloride flush (NS) 0.9 % injection 3 mL (has no administration in time range)  citalopram (CELEXA) tablet 20 mg (20 mg Oral Given 06/27/19 2112)  meclizine (ANTIVERT) tablet 25 mg (25 mg Oral Given 06/27/19 2056)  sodium chloride 0.9 % bolus 500 mL (0 mLs Intravenous Stopped 06/27/19 2148)  ALPRAZolam (XANAX) tablet 1 mg (1 mg Oral Given 06/27/19 2112)     Initial Impression / Assessment and Plan / ED Course  I have reviewed the triage vital signs and the nursing notes.  Pertinent labs & imaging results that were available during my care of the patient were reviewed by me and considered in my medical decision making (see chart for details).  Clinical Course as of Jun 27 2227  Wed Jun 27, 2019  2133 4 episodes of high-rate ventricular episodes since January. One episode occurring at 1515 and another episode occurring at 46 with a sustained ventricular rate of 200 for approx 3 minutes.    [HK]    Clinical Course User Index [HK] Delia Heady, PA-C       74 year old female with a past medical history of diabetes, hypertension, hyperlipidemia presents to ED for syncope.  Patient has had 2 prior thoracic aortic aneurysm repairs and has a pacemaker in place.  She is unsure what caused her to syncope but believes that she was lying on the floor for about 15 minutes.  She was confused and had a headache when she woke up.  On exam there are no deficits neurological exam noted.  Soft tissue swelling noted at the right eyebrow area.  She currently feels dizzy and generally weak.  Denies chest pain, vision changes.  She is anticoagulated on Xarelto.  CT of the head and neck are unremarkable.  Chest x-ray is unremarkable.  Troponin slightly elevated at 44.  CBC with pancytopenia.  BMP unremarkable.  EKG shows paced rhythm.   Pacemaker interrogation shows a high rate ventricular episode occurring during the time of her syncope.  Patient will be seeing electrophysiologist in the morning and will need to be admitted for further management.   Final Clinical Impressions(s) / ED Diagnoses   Final diagnoses:  Syncope, unspecified syncope type    ED Discharge Orders    None       Delia Heady, PA-C 06/27/19 2229    Drenda Freeze, MD 07/02/19 1137

## 2019-06-27 NOTE — ED Notes (Signed)
Pt stating that home meds helped with her anxiety. Now comfortable in bed speaking with family on the phone

## 2019-06-27 NOTE — H&P (Signed)
[name] XIP:[id number] DOB: 1946/08/29 DOA: 06/27/2019     PCP: [name], DO   Outpatient Specialists:   CARDS:  Dr. Sallyanne Kuster    Patient arrived to ER on 06/27/19 at 1746  Patient coming from: home Lives with family     Chief Complaint:  Chief Complaint  Patient presents with   Loss of Consciousness    HPI: [name] is a 73 y.o. female with medical history significant of diabetes, hypertension, hyperlipidemia, thoracic aortic aneurysm repair x2,  SINUS NODAL DYSFUNCTION pacemaker, moderate aortic stenosis    Presented with   could be that occurred today when she got out of shower she woke up being under her bed when she woke up she had a headache and confusion Was no prodrome she was not feeling lightheaded prior to falling down she is feeling overall weak and lightheaded no chest pain no numbness she is chronically on Xarelto  Denies melena no black stools  Infectious risk factors:  Reports  fatigue   In  ER RAPID COVID TEST NEGATIVE     Regarding pertinent Chronic problems:    Hyperlipidemia -  on Crestor    HTN on Coreg, cozaar   CHF  Systolic  - last echo Feb 2020 EF 50-55%. Evidence of diastolic Dysfunction . There is moderate stenosis of the aortic valve, peak velocity 3.23m/s and mean  gradient 35mmHg. On lasix 40 mg     DM 2 -  Lab Results  Component Value Date   HGBA1C 6.8 (A) 06/28/2018   on  PO meds only,       obesity-   BMI Readings from Last 1 Encounters:  01/10/19 38.32 kg/m     While in ER:   Cardiology consulted and requesting medical admission though see patient in emergency department plan for EP cardiology to see in a.m. given cardiac syncope   The following Work up has been ordered so far:  Orders Placed This Encounter  Procedures   Critical Care   Urine culture   SARS Coronavirus 2 (CEPHEID - Performed in [facility] hospital lab), [place] Order   CT HEAD WO CONTRAST   CT Cervical Spine Wo  Contrast   DG Chest 2 View   Basic metabolic panel   CBC   Urinalysis, Routine w reflex microscopic   CK   Differential   Vitamin B12   Folate   Iron and TIBC   Ferritin   Reticulocytes   CBC with Differential/Platelet   Hemoglobin A1c   Magnesium   Phosphorus   TSH   Comprehensive metabolic panel   CBC   Diet Carb Modified Fluid consistency: Thin; Room service appropriate? Yes   Cardiac monitoring   Saline Lock IV, Maintain IV access   STAT CBG when hypoglycemia is suspected. If treated, recheck every 15 minutes after each treatment until CBG >/= 70 mg/dl   Refer to Hypoglycemia Protocol Sidebar Report for treatment of CBG < 70 mg/dl   Cardiac monitoring   Vital signs   Notify physician   Up with assistance   If patient diabetic or glucose greater than 140 notify physician for Sliding Scale Insulin Orders   May go off telemetry for tests/procedures   Oral care per nursing protocol   Initiate Oral Care Protocol   Initiate Carrier Fluid Protocol   RN may order General Admission PRN Orders utilizing "General Admission PRN medications" (through manage orders) for the following patient needs: allergy symptoms (Claritin), cold  sores (Carmex), cough (Robitussin DM), eye irritation (Liquifilm Tears), hemorrhoids (Tucks), indigestion (Maalox), minor skin irritation (Hydrocortisone Cream), muscle pain Suezanne Jacquet Gay), nose irritation (saline nasal spray) and sore throat (Chloraseptic spray).   Patient has an active order for admit to inpatient/place in observation   Cardiac Monitoring Continuous x 48 hours Indications for use: Other; Other indications for use: syncope   Full code   Consult to hospitalist  ALL PATIENTS BEING ADMITTED/HAVING PROCEDURES NEED COVID-19 SCREENING   Consult to hospitalist  ALL PATIENTS BEING ADMITTED/HAVING PROCEDURES NEED COVID-19 SCREENING   Pulse oximetry, continuous   Pulse oximetry check with vital signs   Oxygen  therapy Mode or (Route): Nasal cannula; Liters Per Minute: 2; Keep 02 saturation: greater than 92 %   Incentive spirometry   CBG monitoring, ED   POC occult blood, ED   CBG monitoring, ED   ED EKG   EKG 12-Lead   Place in observation (patient's expected length of stay will be less than 2 midnights)    Following Medications were ordered in ER: Medications  sodium chloride flush (NS) 0.9 % injection 3 mL (has no administration in time range)  citalopram (CELEXA) tablet 20 mg (20 mg Oral Given 06/27/19 2112)  insulin aspart (novoLOG) injection 0-5 Units (0 Units Subcutaneous Not Given 06/28/19 0108)  insulin aspart (novoLOG) injection 0-9 Units (has no administration in time range)  carvedilol (COREG) tablet 25 mg (has no administration in time range)  fenofibrate tablet 160 mg (160 mg Oral Not Given 06/28/19 0056)  furosemide (LASIX) tablet 20 mg (has no administration in time range)  losartan (COZAAR) tablet 25 mg (has no administration in time range)  rosuvastatin (CRESTOR) tablet 20 mg (20 mg Oral Not Given 06/28/19 0056)  ALPRAZolam (XANAX) tablet 0.5 mg (has no administration in time range)  citalopram (CELEXA) tablet 60 mg (has no administration in time range)  rivaroxaban (XARELTO) tablet 20 mg (has no administration in time range)  acetaminophen (TYLENOL) tablet 650 mg (has no administration in time range)    Or  acetaminophen (TYLENOL) suppository 650 mg (has no administration in time range)  ondansetron (ZOFRAN) tablet 4 mg (has no administration in time range)    Or  ondansetron (ZOFRAN) injection 4 mg (has no administration in time range)  meclizine (ANTIVERT) tablet 25 mg (25 mg Oral Given 06/27/19 2056)  sodium chloride 0.9 % bolus 500 mL (0 mLs Intravenous Stopped 06/27/19 2148)  ALPRAZolam (XANAX) tablet 1 mg (1 mg Oral Given 06/27/19 2112)        Consult Orders  (From admission, onward)         Start     Ordered   06/27/19 2329  Consult to hospitalist  ALL  PATIENTS BEING ADMITTED/HAVING PROCEDURES NEED COVID-19 SCREENING Paged [facility]  Once    Comments: ALL PATIENTS BEING ADMITTED/HAVING PROCEDURES NEED COVID-19 SCREENING  Provider:  (Not yet assigned)  Question Answer Comment  Place call to: [facility]   Reason for Consult Admit      06/27/19 2328   06/27/19 2215  Consult to hospitalist  ALL PATIENTS BEING ADMITTED/HAVING PROCEDURES NEED COVID-19 SCREENING PAGED [facility]  Once    Comments: ALL PATIENTS BEING ADMITTED/HAVING PROCEDURES NEED COVID-19 SCREENING  Provider:  (Not yet assigned)  Question Answer Comment  Place call to: [facility]   Reason for Consult Admit      06/27/19 2214          ER Provider Called: cardiology    Dr. Cristobal Goldmann They Recommend  admit to medicine   Will see  in ER   Significant initial  Findings: Abnormal Labs Reviewed  BASIC METABOLIC PANEL - Abnormal; Notable for the following components:      Result Value   Sodium 133 (*)    CO2 21 (*)    Glucose, Bld 147 (*)    Creatinine, Ser 1.06 (*)    Calcium 8.5 (*)    GFR calc non Af Amer 52 (*)    All other components within normal limits  CBC - Abnormal; Notable for the following components:   WBC 3.9 (*)    RBC 3.27 (*)    Hemoglobin 9.1 (*)    HCT 30.1 (*)    RDW 18.2 (*)    Platelets 139 (*)    All other components within normal limits  URINALYSIS, ROUTINE W REFLEX MICROSCOPIC - Abnormal; Notable for the following components:   Color, Urine AMBER (*)    APPearance HAZY (*)    Glucose, UA >=500 (*)    Hgb urine dipstick MODERATE (*)    Protein, ur 30 (*)    All other components within normal limits  RETICULOCYTES - Abnormal; Notable for the following components:   Retic Ct Pct 3.9 (*)    RBC. 3.11 (*)    Immature Retic Fract 20.8 (*)    All other components within normal limits  CBC WITH DIFFERENTIAL/PLATELET - Abnormal; Notable for the following components:   WBC 3.0 (*)    RBC 3.11 (*)    Hemoglobin 8.7 (*)    HCT 28.7  (*)    RDW 18.1 (*)    Platelets 125 (*)    All other components within normal limits  CBG MONITORING, ED - Abnormal; Notable for the following components:   Glucose-Capillary 109 (*)    All other components within normal limits  CBG MONITORING, ED - Abnormal; Notable for the following components:   Glucose-Capillary 111 (*)    All other components within normal limits  TROPONIN I (HIGH SENSITIVITY) - Abnormal; Notable for the following components:   Troponin I (High Sensitivity) 44 (*)    All other components within normal limits  TROPONIN I (HIGH SENSITIVITY) - Abnormal; Notable for the following components:   Troponin I (High Sensitivity) 40 (*)    All other components within normal limits     Otherwise labs showing:    Recent Labs  Lab 06/27/19 1815  NA 133*  K 4.1  CO2 21*  GLUCOSE 147*  BUN 20  CREATININE 1.06*  CALCIUM 8.5*    Cr  stable,    Lab Results  Component Value Date   CREATININE 1.06 (H) 06/27/2019   CREATININE 1.11 (H) 05/05/2018   CREATININE 1.07 06/28/2017    No results for input(s): AST, ALT, ALKPHOS, BILITOT, PROT, ALBUMIN in the last 168 hours. Lab Results  Component Value Date   CALCIUM 8.5 (L) 06/27/2019      WBC      Component Value Date/Time   WBC 3.0 (L) 06/28/2019 0014   ANC    Component Value Date/Time   NEUTROABS PENDING 06/28/2019 0014      Plt: Lab Results  Component Value Date   PLT 125 (L) 06/28/2019      COVID-19 Labs    Lab Results  Component Value Date   SARSCOV2NAA NEGATIVE 06/27/2019      HG/HCT   Down       Component Value Date/Time   HGB 8.7 (L) 06/28/2019 0014  HCT 28.7 (L) 06/28/2019 0014      Troponin 44  Cardiac Panel (last 3 results) Recent Labs    06/27/19 2054  CKTOTAL 39      BNP (last 3 results) No results for input(s): BNP in the last 8760 hours.  ProBNP (last 3 results) No results for input(s): PROBNP in the last 8760 hours.  DM  labs:  HbA1C: Recent Labs     06/28/18 1220  HGBA1C 6.8*       CBG (last 3)  Recent Labs    06/27/19 2013 06/28/19 0045  GLUCAP 109* 111*       UA  no evidence of UTI      Urine analysis:    Component Value Date/Time   COLORURINE AMBER (A) 06/27/2019 2020   APPEARANCEUR HAZY (A) 06/27/2019 2020   LABSPEC 1.018 06/27/2019 2020   PHURINE 5.0 06/27/2019 2020   GLUCOSEU >=500 (A) 06/27/2019 2020   HGBUR MODERATE (A) 06/27/2019 2020   HGBUR negative 12/09/2010 0936   BILIRUBINUR NEGATIVE 06/27/2019 2020   BILIRUBINUR Neg 01/21/2014 1502   KETONESUR NEGATIVE 06/27/2019 2020   PROTEINUR 30 (A) 06/27/2019 2020   UROBILINOGEN 0.2 01/21/2014 1502   UROBILINOGEN negative 12/09/2010 0936   NITRITE NEGATIVE 06/27/2019 2020   LEUKOCYTESUR NEGATIVE 06/27/2019 2020    CT HEAD/Neck  NON acute  CXR -  NON acute    ECG:  Personally reviewed by me showing: HR : 55  Rhythm:  Paced, LBBB  no evidence of ischemic changes QTC 482      ED Triage Vitals  Enc Vitals Group     BP 06/27/19 1757 (!) 135/46     Pulse Rate 06/27/19 1757 (!) 55     Resp 06/27/19 1757 17     Temp 06/27/19 1757 97.8 F (36.6 C)     Temp Source 06/27/19 1757 Oral     SpO2 06/27/19 1757 96 %     Weight --      Height --      Head Circumference --      Peak Flow --      Pain Score 06/27/19 1759 6     Pain Loc --      Pain Edu? --      Excl. in [place]? --   TMAX(24)@       Latest  Blood pressure (!) 153/56, pulse (!) 55, temperature 97.8 F (36.6 C), temperature source Oral, resp. rate (!) 23, SpO2 93 %.     Hospitalist was called for admission for Syncope   Review of Systems:    Pertinent positives include: syncope  fatigue,  Constitutional:  No weight loss, night sweats, Fevers, chills, weight loss  HEENT:  No headaches, Difficulty swallowing,Tooth/dental problems,Sore throat,  No sneezing, itching, ear ache, nasal congestion, post nasal drip,  Cardio-vascular:  No chest pain, Orthopnea, PND, anasarca, dizziness,  palpitations.no Bilateral lower extremity swelling  GI:  No heartburn, indigestion, abdominal pain, nausea, vomiting, diarrhea, change in bowel habits, loss of appetite, melena, blood in stool, hematemesis Resp:  no shortness of breath at rest. No dyspnea on exertion, No excess mucus, no productive cough, No non-productive cough, No coughing up of blood.No change in color of mucus.No wheezing. Skin:  no rash or lesions. No jaundice GU:  no dysuria, change in color of urine, no urgency or frequency. No straining to urinate.  No flank pain.  Musculoskeletal:  No joint pain or no joint swelling. No decreased range of motion. No back  pain.  Psych:  No change in mood or affect. No depression or anxiety. No memory loss.  Neuro: no localizing neurological complaints, no tingling, no weakness, no double vision, no gait abnormality, no slurred speech, no confusion  All systems reviewed and apart from [place] all are negative  Past Medical History:   Past Medical History:  Diagnosis Date   Acute thoracic aortic dissection ([place]) 2008   emergency surgery - [facility]   Anxiety    Cancer [facility])    skin   Diabetes mellitus    Diverticulosis    GERD (gastroesophageal reflux disease)    Hemorrhoids    Hyperlipidemia    Hypertension    Kidney stones    Sinus node dysfunction ([place])    hx of PPM      Past Surgical History:  Procedure Laterality Date   ABDOMINAL HYSTERECTOMY  1984   APPENDECTOMY     EYE SURGERY  2014   Rght eye   PACEMAKER GENERATOR CHANGE  2014   Medtronic adapta   PACEMAKER GENERATOR CHANGE N/A 08/29/2013   Procedure: PACEMAKER GENERATOR CHANGE;  Surgeon: Sanda Klein, MD;  Location: [place] CATH LAB;  Service: Cardiovascular;  Laterality: N/A;   PACEMAKER INSERTION  08/2006   dual chamber Medtronic EnRhythm; r/t sinus node dysfunction    REPAIR OF ACUTE ASCENDING THORACIC AORTIC DISSECTION  2008   Dr. Servando Snare   THORACIC AORTIC ANEURYSM REPAIR  2000    type III   TRANSTHORACIC ECHOCARDIOGRAM  09/20/2012   EF=>55% with mild conc LVH; LA mod dilated; RA mildly dilated; mild MR/TR/AR    Social History:  Ambulatory    Independently      reports that she quit smoking about 25 years ago. She has never used smokeless tobacco. She reports that she does not drink alcohol or use drugs.     Family History:   Family History  Problem Relation Age of Onset   Kidney disease Mother    Heart attack Father    Heart attack Maternal Grandmother    Valvular heart disease Son        valve replacement at 70   Cancer Neg Hx     Allergies: Allergies  Allergen Reactions   Oxycodone Itching   Vicodin [Hydrocodone-Acetaminophen] Itching   Lidocaine-Aloe Vera Other (See Comments)    Felt like bees stinging her   Latex Rash    Itching/rash    Neomycin-Bacitracin Zn-Polymyx Other (See Comments)    Red , swollen eye   Sulfonamide Derivatives Other (See Comments)    Just had reaction when taking during pregnancy- last child birth 89     Prior to Admission medications   Medication Sig Start Date End Date Taking? Authorizing Provider  ALPRAZolam [name] 0.5 MG tablet Take 1 tablet (0.5 mg total) by mouth 3 (three) times daily as needed for anxiety. 06/06/19   Colon Branch, MD  carvedilol (COREG) 25 MG tablet TAKE 1 TABLET BY MOUTH TWICE A DAY WITH A MEAL 08/18/18   Croitoru, Mihai, MD  cholecalciferol (VITAMIN D) 1000 UNITS tablet Take 1,000 Units by mouth daily.    [provider]  citalopram (CELEXA) 20 MG tablet TAKE 3 TABLETS BY MOUTH EVERY DAY 12/07/18   Carollee Herter, Alferd Apa, DO  empagliflozin (JARDIANCE) 10 MG TABS tablet Take 10 mg by mouth daily. 02/09/19   Bensimhon, Shaune Pascal, MD  fenofibrate 160 MG tablet TAKE 1 TABLET BY MOUTH EVERY DAY 06/22/19   Carollee Herter, Yvonne R, DO  fluticasone (  FLONASE) 50 MCG/ACT nasal spray USE 2 SPRAYS IN [facility] NOSTIRL EVERY DAY 06/23/18   Carollee Herter, Alferd Apa, DO  furosemide (LASIX) 40 MG  tablet Take 1 tablet (40 mg total) by mouth daily. 02/09/19   Roma Schanz R, DO  hyoscyamine (LEVSIN SL) 0.125 MG SL tablet TAKE 1 TABLET BY MOUTH TWICE A DAY AS NEEDED FOR CRAMPING OR DIARRHEA OR LOOSE STOOLS 07/18/17   Carollee Herter, Alferd Apa, DO  lansoprazole (PREVACID) 15 MG capsule Take 15 mg by mouth as needed (for heart burn).     [provider]  losartan (COZAAR) 25 MG tablet Take 1 tablet (25 mg total) by mouth daily. 01/10/19 04/10/19  Bensimhon, Shaune Pascal, MD  magnesium oxide (MAG-OX) 400 MG tablet Take 400 mg by mouth daily.    [provider]  Omega-3 Fatty Acids (FISH OIL) 1000 MG CAPS Take by mouth.    [provider]  Polyethyl Glycol-Propyl Glycol (SYSTANE OP) Apply 1 drop to eye 4 (four) times daily - after meals and at bedtime.    [provider]  Probiotic Product (PRO-BIOTIC BLEND PO) Take 1 tablet by mouth as needed.     [provider]  rosuvastatin (CRESTOR) 20 MG tablet TAKE 1 TABLET BY MOUTH EVERY DAY 04/16/19   Carollee Herter, Alferd Apa, DO  sitaGLIPtin (JANUVIA) 100 MG tablet Take 1 tablet (100 mg total) by mouth daily. NO FURTHER REFILLS WITHOUT APPOINTMENT 06/07/19   Philemon Kingdom, MD  tiZANidine (ZANAFLEX) 4 MG tablet Take 1 tablet (4 mg total) by mouth every 6 (six) hours as needed for muscle spasms. 03/07/19   [name], DO  traMADol (ULTRAM) 50 MG tablet TAKE 1 TO 2 TABLETS BY MOUTH EVERY 6 HOURS AS NEEDED 03/07/19   Lowne Chase, Yvonne R, DO  XARELTO 20 MG TABS tablet TAKE 1 TABLET BY MOUTH EVERY DAY WITH SUPPER 11/13/18   Croitoru, Mihai, MD   Physical Exam: Blood pressure (!) 153/56, pulse (!) 55, temperature 97.8 F (36.6 C), temperature source Oral, resp. rate (!) 23, SpO2 93 %. 1. General:  in No Acute distress  Chronically ill -appearing 2. Psychological: Alert and   Oriented 3. Head/ENT:    Dry Mucous Membranes                          Head Non traumatic, neck supple                           Poor  Dentition 4. SKIN:   decreased Skin turgor,  Skin clean Dry and intact no rash 5. Heart: Regular rate and rhythm loud systolic  Murmur, no Rub or gallop 6. Lungs:  no wheezes or crackles   7. Abdomen: Soft, non-tender, Non distended obese bowel sounds present 8. Lower extremities: no clubbing, cyanosis, no  edema 9. Neurologically Grossly intact, moving all 4 extremities equally  10. MSK: Normal range of motion   All other LABS:     Recent Labs  Lab 06/27/19 1815 06/28/19 0014  WBC 3.9* 3.0*  NEUTROABS  --  PENDING  HGB 9.1* 8.7*  HCT 30.1* 28.7*  MCV 92.0 92.3  PLT 139* 125*     Recent Labs  Lab 06/27/19 1815  NA 133*  K 4.1  CL 103  CO2 21*  GLUCOSE 147*  BUN 20  CREATININE 1.06*  CALCIUM 8.5*     No results for  input(s): AST, ALT, ALKPHOS, BILITOT, PROT, ALBUMIN in the last 168 hours.     Cultures:    Component Value Date/Time   SDES URINE, CLEAN CATCH 05/05/2018 2131   SPECREQUEST  05/05/2018 2131    NONE Performed at [facility] [address] [place], [place] [address]    CULT MULTIPLE SPECIES PRESENT, SUGGEST RECOLLECTION (A) 05/05/2018 2131   REPTSTATUS 05/07/2018 FINAL 05/05/2018 2131     Radiological Exams on Admission: Dg Chest 2 View  Result Date: 06/27/2019 CLINICAL DATA:  Fall in her bedroom. EXAM: CHEST - 2 VIEW COMPARISON:  Radiograph 06/18/2011, chest CT 01/27/2017 FINDINGS: Post median sternotomy. Left-sided pacemaker in place. Mild cardiomegaly with unchanged mediastinal contours. No acute airspace disease, pleural effusion, or pneumothorax. No acute osseous abnormalities are seen. IMPRESSION: Mild cardiomegaly without acute abnormality. Electronically Signed   By: Keith Rake M.D.   On: 06/27/2019 22:03   Ct Head Wo Contrast  Result Date: 06/27/2019 CLINICAL DATA:  Head trauma, headache, anticoagulation EXAM: CT HEAD WITHOUT CONTRAST TECHNIQUE: Contiguous axial images were obtained from the base of the skull through the  vertex without intravenous contrast. COMPARISON:  02/09/2013 FINDINGS: Brain: No evidence of acute infarction, hemorrhage, hydrocephalus, extra-axial collection or mass lesion/mass effect. Mild periventricular white matter hypodensity. Vascular: No hyperdense vessel or unexpected calcification. Skull: Normal. Negative for fracture or focal lesion. Sinuses/Orbits: No acute finding. Other: Soft tissue hematoma and laceration of the right forehead. IMPRESSION: 1.  No acute intracranial pathology. 2.  Soft tissue hematoma and laceration of the right forehead. Electronically Signed   By: Eddie Candle M.D.   On: 06/27/2019 20:00   Ct Cervical Spine Wo Contrast  Result Date: 06/27/2019 CLINICAL DATA:  Fall, headache EXAM: CT CERVICAL SPINE WITHOUT CONTRAST TECHNIQUE: Multidetector CT imaging of the cervical spine was performed without intravenous contrast. Multiplanar CT image reconstructions were also generated. COMPARISON:  None. FINDINGS: Alignment: Normal cervical lordosis. Skull base and vertebrae: No acute fracture. No primary bone lesion or focal pathologic process. Soft tissues and spinal canal: No prevertebral fluid or swelling. No visible canal hematoma. Disc levels: Mild degenerative changes of the mid/lower cervical spine. Spinal canal is patent. Upper chest: Visualized lung apices are clear. Other: Visualized thyroid is unremarkable. IMPRESSION: No evidence of traumatic injury to the cervical spine. Mild degenerative changes. Electronically Signed   By: Julian Hy M.D.   On: 06/27/2019 21:09    Chart has been reviewed    Assessment/Plan  73 y.o. female with medical history significant of diabetes, hypertension, hyperlipidemia, thoracic aortic aneurysm repair x2,  SINUS NODAL DYSFUNCTION pacemaker, moderate aortic stenosis Admitted for syncope  Present on Admission:  Syncope - unclear etiology possibly cardiogenic she is cardiology consult echo and Dopplers in a.m. 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, status post graft repair the ascending aorta  -no chest pain continue to manage blood pressure currently stable   GERD - chronic stable continue home medications  Pacemaker - Medtronic Adapta dual chamber Sep 2014, initial implantation 2007 stable interrogated in ER continue per cardiology  Paroxysmal atrial flutter (HCC) -           - CHA2DS2 vas score 4 : continue current anticoagulation with Xarelto,         -  Rate control:  Currently controlled with  Coreg will continue        DM (diabetes mellitus) type II uncontrolled, periph vascular disorder (HCC) -  - Order Sensitive  SSI   -  check TSH and HgA1C  - Hold by mouth medications     Elevated troponin -mild defer to cardiology at this point no evidence of acute ischemia could be demand ischemia in the setting of tachyarrhythmia earlier today obtain echogram monitor on telemetry continue cycle cardiac enzymes   Other plan as per orders.  DVT prophylaxis: Xarelto  Code Status:  FULL CODEcare as per patient   I had personally discussed CODE STATUS with patient   Family Communication:   Family not at  Bedside   Disposition Plan:      To home once workup is complete and patient is stable                                         Consults called: Cardiology aware  Admission status:  ED Disposition    ED Disposition Condition [place]: [place] [100100]  Level of Care: Telemetry Cardiac [103]  I expect the patient will be discharged within  24 hours: No (not a candidate for 5C-Observation unit)  Covid Evaluation: Confirmed COVID Negative  Diagnosis: Syncope [206001]  Admitting Physician: Toy Baker [[id number]]  Attending Physician: Toy Baker [[id number]]  PT Class (Do Not Modify): Observation [104]  PT Acc Code (Do Not Modify): Observation [10022]       Obs   Level of care    tele  For 24H      Precautions: No active isolations  PPE: Used by the provider:   P100  eye Goggles,  Gloves       Masiyah Engen 06/28/2019, 1:25 AM    [facility]     after 2 AM please page floor coverage PA If 7AM-7PM, please contact the day team taking care of the patient using [email or web address]

## 2019-06-27 NOTE — ED Notes (Signed)
ED Provider at bedside. 

## 2019-06-28 ENCOUNTER — Encounter (HOSPITAL_COMMUNITY)
Admission: EM | Disposition: A | Discharge status: Home / Self Care | Payer: Self-pay | Source: Home / Self Care | Attending: Internal Medicine

## 2019-06-28 ENCOUNTER — Observation Stay (HOSPITAL_COMMUNITY): Payer: Medicare HMO

## 2019-06-28 DIAGNOSIS — R7989 Other specified abnormal findings of blood chemistry: Secondary | ICD-10-CM | POA: Diagnosis present

## 2019-06-28 DIAGNOSIS — I4892 Unspecified atrial flutter: Secondary | ICD-10-CM | POA: Diagnosis present

## 2019-06-28 DIAGNOSIS — Y92003 Bedroom of unspecified non-institutional (private) residence as the place of occurrence of the external cause: Secondary | ICD-10-CM | POA: Diagnosis not present

## 2019-06-28 DIAGNOSIS — I472 Ventricular tachycardia, unspecified: Secondary | ICD-10-CM | POA: Insufficient documentation

## 2019-06-28 DIAGNOSIS — R55 Syncope and collapse: Secondary | ICD-10-CM | POA: Diagnosis present

## 2019-06-28 DIAGNOSIS — E785 Hyperlipidemia, unspecified: Secondary | ICD-10-CM | POA: Diagnosis present

## 2019-06-28 DIAGNOSIS — I350 Nonrheumatic aortic (valve) stenosis: Secondary | ICD-10-CM

## 2019-06-28 DIAGNOSIS — I5032 Chronic diastolic (congestive) heart failure: Secondary | ICD-10-CM | POA: Diagnosis present

## 2019-06-28 DIAGNOSIS — R778 Other specified abnormalities of plasma proteins: Secondary | ICD-10-CM | POA: Diagnosis present

## 2019-06-28 DIAGNOSIS — Z6835 Body mass index (BMI) 35.0-35.9, adult: Secondary | ICD-10-CM | POA: Diagnosis not present

## 2019-06-28 DIAGNOSIS — I351 Nonrheumatic aortic (valve) insufficiency: Secondary | ICD-10-CM | POA: Diagnosis present

## 2019-06-28 DIAGNOSIS — I7101 Dissection of thoracic aorta: Secondary | ICD-10-CM | POA: Diagnosis not present

## 2019-06-28 DIAGNOSIS — I2510 Atherosclerotic heart disease of native coronary artery without angina pectoris: Secondary | ICD-10-CM | POA: Diagnosis present

## 2019-06-28 DIAGNOSIS — S0011XA Contusion of right eyelid and periocular area, initial encounter: Secondary | ICD-10-CM | POA: Diagnosis present

## 2019-06-28 DIAGNOSIS — Z8249 Family history of ischemic heart disease and other diseases of the circulatory system: Secondary | ICD-10-CM | POA: Diagnosis not present

## 2019-06-28 DIAGNOSIS — Z841 Family history of disorders of kidney and ureter: Secondary | ICD-10-CM | POA: Diagnosis not present

## 2019-06-28 DIAGNOSIS — Z950 Presence of cardiac pacemaker: Secondary | ICD-10-CM | POA: Diagnosis not present

## 2019-06-28 DIAGNOSIS — I428 Other cardiomyopathies: Secondary | ICD-10-CM

## 2019-06-28 DIAGNOSIS — D649 Anemia, unspecified: Secondary | ICD-10-CM | POA: Diagnosis not present

## 2019-06-28 DIAGNOSIS — Z20828 Contact with and (suspected) exposure to other viral communicable diseases: Secondary | ICD-10-CM | POA: Diagnosis present

## 2019-06-28 DIAGNOSIS — K219 Gastro-esophageal reflux disease without esophagitis: Secondary | ICD-10-CM | POA: Diagnosis present

## 2019-06-28 DIAGNOSIS — I110 Hypertensive heart disease with heart failure: Secondary | ICD-10-CM | POA: Diagnosis present

## 2019-06-28 DIAGNOSIS — W1839XA Other fall on same level, initial encounter: Secondary | ICD-10-CM | POA: Diagnosis present

## 2019-06-28 DIAGNOSIS — E611 Iron deficiency: Secondary | ICD-10-CM | POA: Diagnosis present

## 2019-06-28 DIAGNOSIS — Z7901 Long term (current) use of anticoagulants: Secondary | ICD-10-CM | POA: Diagnosis not present

## 2019-06-28 DIAGNOSIS — Z87891 Personal history of nicotine dependence: Secondary | ICD-10-CM | POA: Diagnosis not present

## 2019-06-28 DIAGNOSIS — E1151 Type 2 diabetes mellitus with diabetic peripheral angiopathy without gangrene: Secondary | ICD-10-CM | POA: Diagnosis present

## 2019-06-28 DIAGNOSIS — Z7984 Long term (current) use of oral hypoglycemic drugs: Secondary | ICD-10-CM | POA: Diagnosis not present

## 2019-06-28 HISTORY — PX: ICD IMPLANT: EP1208

## 2019-06-28 LAB — CBC WITH DIFFERENTIAL/PLATELET
Abs Immature Granulocytes: 0.02 10*3/uL (ref 0.00–0.07)
Basophils Absolute: 0 10*3/uL (ref 0.0–0.1)
Basophils Relative: 0 %
Eosinophils Absolute: 0 10*3/uL (ref 0.0–0.5)
Eosinophils Relative: 1 %
HCT: 28.7 % — ABNORMAL LOW (ref 36.0–46.0)
Hemoglobin: 8.7 g/dL — ABNORMAL LOW (ref 12.0–15.0)
Immature Granulocytes: 1 %
Lymphocytes Relative: 37 %
Lymphs Abs: 1.1 10*3/uL (ref 0.7–4.0)
MCH: 28 pg (ref 26.0–34.0)
MCHC: 30.3 g/dL (ref 30.0–36.0)
MCV: 92.3 fL (ref 80.0–100.0)
Monocytes Absolute: 0.2 10*3/uL (ref 0.1–1.0)
Monocytes Relative: 6 %
Neutro Abs: 1.7 10*3/uL (ref 1.7–7.7)
Neutrophils Relative %: 55 %
Platelets: 125 10*3/uL — ABNORMAL LOW (ref 150–400)
RBC: 3.11 MIL/uL — ABNORMAL LOW (ref 3.87–5.11)
RDW: 18.1 % — ABNORMAL HIGH (ref 11.5–15.5)
WBC: 3 10*3/uL — ABNORMAL LOW (ref 4.0–10.5)
nRBC: 0 % (ref 0.0–0.2)

## 2019-06-28 LAB — COMPREHENSIVE METABOLIC PANEL
ALT: 12 U/L (ref 0–44)
AST: 26 U/L (ref 15–41)
Albumin: 2.6 g/dL — ABNORMAL LOW (ref 3.5–5.0)
Alkaline Phosphatase: 49 U/L (ref 38–126)
Anion gap: 6 (ref 5–15)
BUN: 17 mg/dL (ref 8–23)
CO2: 23 mmol/L (ref 22–32)
Calcium: 8.3 mg/dL — ABNORMAL LOW (ref 8.9–10.3)
Chloride: 104 mmol/L (ref 98–111)
Creatinine, Ser: 0.82 mg/dL (ref 0.44–1.00)
GFR calc Af Amer: >60 mL/min (ref >60)
GFR calc non Af Amer: >60 mL/min (ref >60)
Glucose, Bld: 138 mg/dL — ABNORMAL HIGH (ref 70–99)
Potassium: 3.9 mmol/L (ref 3.5–5.1)
Sodium: 133 mmol/L — ABNORMAL LOW (ref 135–145)
Total Bilirubin: 0.9 mg/dL (ref 0.3–1.2)
Total Protein: 8.5 g/dL — ABNORMAL HIGH (ref 6.5–8.1)

## 2019-06-28 LAB — IRON AND TIBC
Iron: 35 ug/dL (ref 28–170)
Iron: 25 ug/dL — ABNORMAL LOW (ref 28–170)
Saturation Ratios: 11 % (ref 10.4–31.8)
Saturation Ratios: 8 % — ABNORMAL LOW (ref 10.4–31.8)
TIBC: 307 ug/dL (ref 250–450)
TIBC: 295 ug/dL (ref 250–450)
UIBC: 272 ug/dL
UIBC: 270 ug/dL

## 2019-06-28 LAB — CBG MONITORING, ED
Glucose-Capillary: 111 mg/dL — ABNORMAL HIGH (ref 70–99)
Glucose-Capillary: 99 mg/dL (ref 70–99)

## 2019-06-28 LAB — CBC
HCT: 27.9 % — ABNORMAL LOW (ref 36.0–46.0)
Hemoglobin: 8.6 g/dL — ABNORMAL LOW (ref 12.0–15.0)
MCH: 28.1 pg (ref 26.0–34.0)
MCHC: 30.8 g/dL (ref 30.0–36.0)
MCV: 91.2 fL (ref 80.0–100.0)
Platelets: 124 10*3/uL — ABNORMAL LOW (ref 150–400)
RBC: 3.06 MIL/uL — ABNORMAL LOW (ref 3.87–5.11)
RDW: 18.1 % — ABNORMAL HIGH (ref 11.5–15.5)
WBC: 2.5 10*3/uL — ABNORMAL LOW (ref 4.0–10.5)
nRBC: 0 % (ref 0.0–0.2)

## 2019-06-28 LAB — FOLATE
Folate: 11.5 ng/mL (ref >5.9)
Folate: 12.5 ng/mL (ref >5.9)

## 2019-06-28 LAB — URINE CULTURE: Culture: NO GROWTH

## 2019-06-28 LAB — HEMOGLOBIN A1C
Hgb A1c MFr Bld: 5.5 % (ref 4.8–5.6)
Mean Plasma Glucose: 111.15 mg/dL

## 2019-06-28 LAB — RETICULOCYTES
Immature Retic Fract: 20.8 % — ABNORMAL HIGH (ref 2.3–15.9)
RBC.: 3.11 MIL/uL — ABNORMAL LOW (ref 3.87–5.11)
Retic Count, Absolute: 120.7 10*3/uL (ref 19.0–186.0)
Retic Ct Pct: 3.9 % — ABNORMAL HIGH (ref 0.4–3.1)

## 2019-06-28 LAB — GLUCOSE, CAPILLARY: Glucose-Capillary: 93 mg/dL (ref 70–99)

## 2019-06-28 LAB — FERRITIN
Ferritin: 301 ng/mL (ref 11–307)
Ferritin: 317 ng/mL — ABNORMAL HIGH (ref 11–307)

## 2019-06-28 LAB — PHOSPHORUS: Phosphorus: 3 mg/dL (ref 2.5–4.6)

## 2019-06-28 LAB — VITAMIN B12
Vitamin B-12: 243 pg/mL (ref 180–914)
Vitamin B-12: 248 pg/mL (ref 180–914)

## 2019-06-28 LAB — ECHOCARDIOGRAM COMPLETE

## 2019-06-28 LAB — TROPONIN I (HIGH SENSITIVITY)
Troponin I (High Sensitivity): 32 ng/L — ABNORMAL HIGH (ref <18)
Troponin I (High Sensitivity): 32 ng/L — ABNORMAL HIGH (ref <18)

## 2019-06-28 LAB — TSH: TSH: 0.893 u[IU]/mL (ref 0.350–4.500)

## 2019-06-28 LAB — MAGNESIUM: Magnesium: 2 mg/dL (ref 1.7–2.4)

## 2019-06-28 SURGERY — ICD IMPLANT
Anesthesia: LOCAL

## 2019-06-28 MED ORDER — ONDANSETRON HCL 4 MG PO TABS: 4.0000 mg | ORAL_TABLET | Freq: Four times a day (QID) | ORAL | Status: DC | PRN

## 2019-06-28 MED ORDER — SODIUM CHLORIDE 0.9% FLUSH: 3.0000 mL | INTRAVENOUS | Status: DC | PRN

## 2019-06-28 MED ORDER — IOHEXOL 350 MG/ML SOLN
95.0000 mL | Freq: Once | INTRAVENOUS | Status: AC | PRN
  Administered 2019-06-28: 95 mL via INTRAVENOUS

## 2019-06-28 MED ORDER — ACETAMINOPHEN 650 MG RE SUPP: 650.0000 mg | Freq: Four times a day (QID) | RECTAL | Status: DC | PRN

## 2019-06-28 MED ORDER — ALPRAZOLAM 0.5 MG PO TABS: 1.0000 mg | ORAL_TABLET | Freq: Every day | ORAL | Status: DC

## 2019-06-28 MED ORDER — ACETAMINOPHEN 325 MG PO TABS
325.0000 mg | ORAL_TABLET | ORAL | Status: DC | PRN
  Administered 2019-06-28: 650 mg via ORAL
  Administered 2019-06-29: 325 mg via ORAL

## 2019-06-28 MED ORDER — INSULIN ASPART 100 UNIT/ML ~~LOC~~ SOLN: 0.0000 [IU] | Freq: Three times a day (TID) | SUBCUTANEOUS | Status: DC

## 2019-06-28 MED ORDER — SODIUM CHLORIDE 0.9 % IV SOLN: INTRAVENOUS | Status: DC

## 2019-06-28 MED ORDER — CARVEDILOL 25 MG PO TABS
25.0000 mg | ORAL_TABLET | Freq: Two times a day (BID) | ORAL | Status: DC
  Administered 2019-06-28 – 2019-06-29 (×3): 25 mg via ORAL
  Filled 2019-06-28: qty 1
  Filled 2019-06-28: qty 2
  Filled 2019-06-28: qty 1

## 2019-06-28 MED ORDER — LIDOCAINE HCL 1 % IJ SOLN
INTRAMUSCULAR | Status: AC
  Filled 2019-06-28: qty 20

## 2019-06-28 MED ORDER — ALPRAZOLAM 0.5 MG PO TABS
0.5000 mg | ORAL_TABLET | Freq: Three times a day (TID) | ORAL | Status: DC | PRN
  Administered 2019-06-28: 0.5 mg via ORAL
  Filled 2019-06-28: qty 2

## 2019-06-28 MED ORDER — SODIUM CHLORIDE 0.9% FLUSH
3.0000 mL | Freq: Two times a day (BID) | INTRAVENOUS | Status: DC
  Administered 2019-06-29: 3 mL via INTRAVENOUS

## 2019-06-28 MED ORDER — INSULIN ASPART 100 UNIT/ML ~~LOC~~ SOLN: 0.0000 [IU] | Freq: Every day | SUBCUTANEOUS | Status: DC

## 2019-06-28 MED ORDER — CITALOPRAM HYDROBROMIDE 20 MG PO TABS
60.0000 mg | ORAL_TABLET | Freq: Every day | ORAL | Status: DC
  Filled 2019-06-28: qty 6

## 2019-06-28 MED ORDER — TRAMADOL HCL 50 MG PO TABS
50.0000 mg | ORAL_TABLET | Freq: Once | ORAL | Status: AC
  Administered 2019-06-28: 50 mg via ORAL
  Filled 2019-06-28: qty 1

## 2019-06-28 MED ORDER — LOSARTAN POTASSIUM 25 MG PO TABS
25.0000 mg | ORAL_TABLET | Freq: Every day | ORAL | Status: DC
  Administered 2019-06-28 – 2019-06-29 (×2): 25 mg via ORAL
  Filled 2019-06-28 (×2): qty 1

## 2019-06-28 MED ORDER — HEPARIN (PORCINE) IN NACL 1000-0.9 UT/500ML-% IV SOLN
INTRAVENOUS | Status: AC
  Filled 2019-06-28: qty 500

## 2019-06-28 MED ORDER — MIDAZOLAM HCL 5 MG/5ML IJ SOLN
INTRAMUSCULAR | Status: DC | PRN
  Administered 2019-06-28 (×4): 1 mg via INTRAVENOUS

## 2019-06-28 MED ORDER — FENOFIBRATE 160 MG PO TABS
160.0000 mg | ORAL_TABLET | Freq: Every day | ORAL | Status: DC
  Administered 2019-06-28: 160 mg via ORAL
  Filled 2019-06-28 (×2): qty 1

## 2019-06-28 MED ORDER — ALPRAZOLAM 0.5 MG PO TABS
1.0000 mg | ORAL_TABLET | Freq: Every day | ORAL | Status: DC
  Administered 2019-06-28: 1 mg via ORAL
  Filled 2019-06-28: qty 2

## 2019-06-28 MED ORDER — MIDAZOLAM HCL 5 MG/5ML IJ SOLN
INTRAMUSCULAR | Status: AC
  Filled 2019-06-28: qty 5

## 2019-06-28 MED ORDER — SODIUM CHLORIDE 0.9 % IV SOLN: 250.0000 mL | INTRAVENOUS | Status: DC

## 2019-06-28 MED ORDER — FENTANYL CITRATE (PF) 100 MCG/2ML IJ SOLN
INTRAMUSCULAR | Status: AC
  Filled 2019-06-28: qty 2

## 2019-06-28 MED ORDER — CITALOPRAM HYDROBROMIDE 20 MG PO TABS
40.0000 mg | ORAL_TABLET | Freq: Every day | ORAL | Status: DC
  Administered 2019-06-28: 40 mg via ORAL
  Filled 2019-06-28: qty 2

## 2019-06-28 MED ORDER — CEFAZOLIN SODIUM-DEXTROSE 2-4 GM/100ML-% IV SOLN
2.0000 g | INTRAVENOUS | Status: AC
  Administered 2019-06-28: 2 g via INTRAVENOUS

## 2019-06-28 MED ORDER — LIDOCAINE HCL (PF) 1 % IJ SOLN
INTRAMUSCULAR | Status: DC | PRN
  Administered 2019-06-28: 15:00:00 45 mL

## 2019-06-28 MED ORDER — ALPRAZOLAM 0.5 MG PO TABS
0.5000 mg | ORAL_TABLET | Freq: Every day | ORAL | Status: DC
  Administered 2019-06-28 – 2019-06-29 (×2): 0.5 mg via ORAL
  Filled 2019-06-28 (×2): qty 1

## 2019-06-28 MED ORDER — ONDANSETRON HCL 4 MG/2ML IJ SOLN: 4.0000 mg | Freq: Four times a day (QID) | INTRAMUSCULAR | Status: DC | PRN

## 2019-06-28 MED ORDER — SODIUM CHLORIDE 0.9 % IV SOLN
INTRAVENOUS | Status: AC
  Filled 2019-06-28: qty 2

## 2019-06-28 MED ORDER — ALPRAZOLAM 0.5 MG PO TABS: 0.5000 mg | ORAL_TABLET | Freq: Every day | ORAL | Status: DC

## 2019-06-28 MED ORDER — FUROSEMIDE 20 MG PO TABS
20.0000 mg | ORAL_TABLET | Freq: Every day | ORAL | Status: DC
  Administered 2019-06-28 – 2019-06-29 (×2): 20 mg via ORAL
  Filled 2019-06-28 (×2): qty 1

## 2019-06-28 MED ORDER — CHLORHEXIDINE GLUCONATE 4 % EX LIQD
60.0000 mL | Freq: Once | CUTANEOUS | Status: DC
  Filled 2019-06-28: qty 60

## 2019-06-28 MED ORDER — ACETAMINOPHEN 325 MG PO TABS
650.0000 mg | ORAL_TABLET | Freq: Four times a day (QID) | ORAL | Status: DC | PRN
  Filled 2019-06-28 (×2): qty 2

## 2019-06-28 MED ORDER — RIVAROXABAN 20 MG PO TABS: 20.0000 mg | ORAL_TABLET | Freq: Every day | ORAL | Status: DC

## 2019-06-28 MED ORDER — CEFAZOLIN SODIUM-DEXTROSE 1-4 GM/50ML-% IV SOLN
1.0000 g | Freq: Four times a day (QID) | INTRAVENOUS | Status: AC
  Administered 2019-06-28 – 2019-06-29 (×3): 1 g via INTRAVENOUS
  Filled 2019-06-28 (×3): qty 50

## 2019-06-28 MED ORDER — CEFAZOLIN SODIUM-DEXTROSE 2-4 GM/100ML-% IV SOLN
INTRAVENOUS | Status: AC
  Filled 2019-06-28: qty 100

## 2019-06-28 MED ORDER — ROSUVASTATIN CALCIUM 20 MG PO TABS
20.0000 mg | ORAL_TABLET | Freq: Every day | ORAL | Status: DC
  Administered 2019-06-28: 20 mg via ORAL
  Filled 2019-06-28: qty 1

## 2019-06-28 MED ORDER — FENTANYL CITRATE (PF) 100 MCG/2ML IJ SOLN
INTRAMUSCULAR | Status: DC | PRN
  Administered 2019-06-28 (×3): 12.5 ug via INTRAVENOUS

## 2019-06-28 MED ORDER — SODIUM CHLORIDE 0.9 % IV SOLN
80.0000 mg | INTRAVENOUS | Status: AC
  Administered 2019-06-28: 80 mg
  Filled 2019-06-28: qty 2

## 2019-06-28 MED ORDER — HEPARIN (PORCINE) IN NACL 1000-0.9 UT/500ML-% IV SOLN
INTRAVENOUS | Status: DC | PRN
  Administered 2019-06-28: 500 mL

## 2019-06-28 SURGICAL SUPPLY — 8 items
CABLE SURGICAL S-101-97-12 (CABLE) ×2 IMPLANT
ICD EVERA DR XT MRI DDMB1D4 (ICD Generator) ×1 IMPLANT
KIT LEAD END CAP (Cap) IMPLANT
LEAD END CAP (Cap) ×1 IMPLANT
LEAD SPRINT QUAT SEC 6935M-55 (Lead) ×1 IMPLANT
PAD PRO RADIOLUCENT 2001M-C (PAD) ×2 IMPLANT
SHEATH CLASSIC 9F (SHEATH) ×1 IMPLANT
TRAY PACEMAKER INSERTION (PACKS) ×2 IMPLANT

## 2019-06-28 NOTE — ED Notes (Signed)
ECHO at bedside.

## 2019-06-28 NOTE — Progress Notes (Signed)
Progress Note  Patient Name: [name] Date of Encounter: 06/28/2019  Primary Cardiologist: Sanda Klein, MD   Subjective   Anxious and scared, but currently without complaints of angina, dyspnea or presyncope. Describes getting out of the shower around 1515h, going to her bed and then waking up on the floor.  Did not have prodromal chest pain, palpitations or dizziness. Pacemaker interrogation shows an episode of high ventricular rate with clear A-V dissociation occurring at 1522h, average ventricular rate 199 bpm, but with periods of cycle length 230 ms (260 bpm). She has been trying hard to lose weight, reports 40 pound weight loss since winter, as she is preparing to be in better shape for anticipated TAVR.  Pacemaker interrogation shows normal device function.  [employer] Adapta dual-chamber device estimated generator longevity 9.5 years, atrial and ventricular leads implanted 2007 with normal parameters (no reported P waves due to high-frequency atrial pacing, otherwise excellent pacing thresholds and impedances).  A brief episode of nonsustained VT is recorded on July 5.  An episode of high ventricular rate lasting for 2 minutes and 42 seconds is reported on July 29, coinciding exactly with the reported syncope, only marker tracing available, no electrogram.  There is clear A-V dissociation.  There is suspicion of intermittent ventricular undersensing in the tachycardia.  Inpatient Medications    Scheduled Meds: . carvedilol  25 mg Oral BID WC  . citalopram  20 mg Oral Daily  . citalopram  60 mg Oral Daily  . diclofenac sodium  2 g Topical QID  . fenofibrate  160 mg Oral QHS  . furosemide  20 mg Oral Daily  . insulin aspart  0-5 Units Subcutaneous QHS  . insulin aspart  0-9 Units Subcutaneous TID WC  . losartan  25 mg Oral Daily  . rivaroxaban  20 mg Oral Q supper  . rosuvastatin  20 mg Oral QHS  . sodium chloride flush  3 mL Intravenous Once   Continuous Infusions:  PRN Meds: acetaminophen **OR** acetaminophen, ALPRAZolam, ondansetron **OR** ondansetron (ZOFRAN) IV   Vital Signs    Vitals:   06/28/19 0825 06/28/19 0830 06/28/19 0900 06/28/19 0930  BP:  116/66 109/79 (!) 110/45  Pulse: (!) 55   (!) 55  Resp:    18  Temp:      TempSrc:      SpO2: 97%   96%   No intake or output data in the 24 hours ending 06/28/19 1037 Last 3 Weights 01/10/2019 12/06/2018 11/13/2018  Weight (lbs) 196 lb 3.2 oz 197 lb 3.2 oz 197 lb  Weight (kg) 88.996 kg 89.449 kg 89.359 kg      Telemetry    Atrial paced, ventricular sensed rhythm- Personally Reviewed  ECG    Atrial paced, atypical left bundle branch block- Personally Reviewed  Previous ECG that shows atrial paced, ventricular paced rhythm has prominent positive R waves in lead V1, consistent with epicardial position of the distal electrode.  Review of chest CT confirms the right ventricular lead appears to be transparietal.  Physical Exam  Obese GEN: No acute distress.  Small skin laceration and periorbital hematoma on the right side Neck: No JVD Cardiac: RRR, no murmurs, rubs, or gallops.  Healthy left subclavian pacemaker site  Respiratory: Clear to auscultation bilaterally. GI: Soft, nontender, non-distended  MS: No edema; No deformity. Neuro:  Nonfocal  Psych: Normal affect   Labs    High Sensitivity Troponin:   Recent Labs  Lab 06/27/19 2054 06/27/19 2201 06/28/19 0345  06/28/19 0816  TROPONINIHS 44* 40* 32* 32*      Cardiac EnzymesNo results for input(s): TROPONINI in the last 168 hours. No results for input(s): TROPIPOC in the last 168 hours.   Chemistry Recent Labs  Lab 06/27/19 1815 06/28/19 0213  NA 133* 133*  K 4.1 3.9  CL 103 104  CO2 21* 23  GLUCOSE 147* 138*  BUN 20 17  CREATININE 1.06* 0.82  CALCIUM 8.5* 8.3*  PROT  --  8.5*  ALBUMIN  --  2.6*  AST  --  26  ALT  --  12  ALKPHOS  --  49  BILITOT  --  0.9  GFRNONAA 52* >60  GFRAA >60 >60  ANIONGAP 9 6      Hematology Recent Labs  Lab 06/27/19 1815 06/28/19 0014 06/28/19 0213  WBC 3.9* 3.0* 2.5*  RBC 3.27* 3.11*  3.11* 3.06*  HGB 9.1* 8.7* 8.6*  HCT 30.1* 28.7* 27.9*  MCV 92.0 92.3 91.2  MCH 27.8 28.0 28.1  MCHC 30.2 30.3 30.8  RDW 18.2* 18.1* 18.1*  PLT 139* 125* 124*    BNPNo results for input(s): BNP, PROBNP in the last 168 hours.   DDimer No results for input(s): DDIMER in the last 168 hours.   Radiology    Dg Chest 2 View  Result Date: 06/27/2019 CLINICAL DATA:  Fall in her bedroom. EXAM: CHEST - 2 VIEW COMPARISON:  Radiograph 06/18/2011, chest CT 01/27/2017 FINDINGS: Post median sternotomy. Left-sided pacemaker in place. Mild cardiomegaly with unchanged mediastinal contours. No acute airspace disease, pleural effusion, or pneumothorax. No acute osseous abnormalities are seen. IMPRESSION: Mild cardiomegaly without acute abnormality. Electronically Signed   By: Keith Rake M.D.   On: 06/27/2019 22:03   Ct Head Wo Contrast  Result Date: 06/27/2019 CLINICAL DATA:  Head trauma, headache, anticoagulation EXAM: CT HEAD WITHOUT CONTRAST TECHNIQUE: Contiguous axial images were obtained from the base of the skull through the vertex without intravenous contrast. COMPARISON:  02/09/2013 FINDINGS: Brain: No evidence of acute infarction, hemorrhage, hydrocephalus, extra-axial collection or mass lesion/mass effect. Mild periventricular white matter hypodensity. Vascular: No hyperdense vessel or unexpected calcification. Skull: Normal. Negative for fracture or focal lesion. Sinuses/Orbits: No acute finding. Other: Soft tissue hematoma and laceration of the right forehead. IMPRESSION: 1.  No acute intracranial pathology. 2.  Soft tissue hematoma and laceration of the right forehead. Electronically Signed   By: Eddie Candle M.D.   On: 06/27/2019 20:00   Ct Cervical Spine Wo Contrast  Result Date: 06/27/2019 CLINICAL DATA:  Fall, headache EXAM: CT CERVICAL SPINE WITHOUT CONTRAST TECHNIQUE:  Multidetector CT imaging of the cervical spine was performed without intravenous contrast. Multiplanar CT image reconstructions were also generated. COMPARISON:  None. FINDINGS: Alignment: Normal cervical lordosis. Skull base and vertebrae: No acute fracture. No primary bone lesion or focal pathologic process. Soft tissues and spinal canal: No prevertebral fluid or swelling. No visible canal hematoma. Disc levels: Mild degenerative changes of the mid/lower cervical spine. Spinal canal is patent. Upper chest: Visualized lung apices are clear. Other: Visualized thyroid is unremarkable. IMPRESSION: No evidence of traumatic injury to the cervical spine. Mild degenerative changes. Electronically Signed   By: Julian Hy M.D.   On: 06/27/2019 21:09    Cardiac Studies   Comprehensive pacemaker interrogation described above.  Echocardiogram February 2020  1. The left ventricle has low normal systolic function, with an ejection fraction of 50-55%. The cavity size was normal. There is mildly increased left ventricular wall thickness. Left ventricular  diastolic Doppler parameters are consistent with  pseudonormalization.  2. The right ventricle has normal systolic function. The cavity was normal. There is no increase in right ventricular wall thickness.  3. Left atrial size was mildly dilated.  4. The tricuspid valve is normal in structure.  5. The aortic valve is tricuspid There is severe thickening and severe calcifcation of the aortic valve. Aortic valve regurgitation is moderate by color flow Doppler. There is moderate stenosis of the aortic valve, peak velocity 3.98m/s and mean  gradient 58mmHg.  6. The pulmonic valve was normal in structure.  7. There is mild dilatation at the level of the sinuses of Valsalva measuring 38 mm.  8. Right atrial pressure is estimated at 3 mmHg.  9. The mitral valve is normal in structure.  Patient Profile     73 y.o. female presenting with syncope and confirmed  VT on her pacemaker, with a background of multiple cardiovascular problems including chronic type A aortic dissection, moderate to severe aortic stenosis (mean gradient 28 mmHg, peak aortic velocity 3.6 m/second in February 2020), sinus node dysfunction (atrially pacemaker dependent), with dual-chamber pacemaker (implanted 2007, generator change 2014), left bundle branch block, mild left ventricular systolic dysfunction (most recent LVEF 50%).  Assessment & Plan    1. VT: Although we do not have intracardiac electrograms the cycle length in the intermittent undersensing of ventricular beats is suggestive of polymorphic VT.  It seems that regardless of additional work-up, she will meet criteria for secondary prevention implantation of a defibrillator (upgrade from pacemaker).  She had "completely clean" coronaries at cardiac catheterization in 2008 and has not had angina pectoris.  Will reevaluate coronary arteries with a coronary CT angiogram.  EP consultation requested, Dr. Lovena Le will see today. 2. CMP: Mild cardiomyopathy with LVEF decreased in part due to ventricular dyssynchrony.  Repeat echo to reevaluate EF.  Consider CRT, but she has not had a lot of problems with congestive heart failure to date.  3. CHF, primarily diastolic: NYHA functional class I-2 on a low-dose of loop diuretic, max dose carvedilol, losartan.  4. AS: The valve is markedly calcified and velocity/gradients suggest that she will require aortic valve replacement in the next 1-1.5 years.  Syncope is not likely related to aortic stenosis since it occurred without exertion and there is clear alternative explanation (VT).  Nevertheless, we should reevaluate the aortic valve and begin preparation for TAVR.  She is not a good candidate for surgical aortic valve replacement due to previous sternotomy and aortic dissection. Need to discuss with the structural heart team.  She also has moderate aortic insufficiency. 5. Ao dissection: By Dr.  Servando Snare.  She has residual intimal flap involving the aortic arch, left subclavian artery and the abdominal aorta.  There is no evidence of excessive aortic dilation that would require surgery in the near future.  We will get a CT angiogram of the aorta (chest/abdomen/pelvis) to plan her TAVR procedure, but it is quite likely she will require transapical approach to avoid the dissection flaps.   6.  SSS/PM: "Atrially pacemaker dependent".  Preserved AV conduction with less than 2% ventricular pacing, but if she has TAVR it is almost certain she will progress to complete heart block since she has a major intraventricular conduction delay/atypical LBBB.  It is worth considering upgrade to CRT-D for this reason.  Discuss with Dr. Lovena Le.  During ventricular pacing there is a positive R wave in lead V1 and the CT of the chest appears to  confirm that the tip of her right ventricular pacing lead is in the an epicardial position.  The lead is now 73 years old and revision is not indicated.  She will get a new RV ICD lead anyway. 7. Anemia: Normocytic normochromic and with increased reticulocytes.  Hemoglobin has dropped from 13 to 9 over period of a year, but review of records from [facility] showed that her hemoglobin was in the 9-10 range during her admission there in December.  Check anemia panel.  Hemoccult negative.  Consider hemolysis. 8. Aflutter: Asymptomatic atrial flutter was detected by her pacemaker over 3 years ago.  None since.  We will stop her Xarelto for the time being. 9. Obesity/DM: Severe obesity with multiple comorbid conditions. Glycemic control with hemoglobin A1c 5.5%.  Most recent lipid profile showed excellent LDL cholesterol 70 and HDL 51. 10. HTN: On the current dose of carvedilol and losartan her blood pressure is borderline low (in fact she tends to run a low diastolic blood pressure, possibly related to her aortic insufficiency).     For questions or updates, please contact [place]  Please consult [email or web address] for contact info under        Signed, Sanda Klein, MD  06/28/2019, 10:37 AM

## 2019-06-28 NOTE — ED Notes (Signed)
Pt placed on 2L [place] due to desating when sleeping only. No signs of distress noted

## 2019-06-28 NOTE — H&P (Deleted)
[name] FTD:[id number] DOB: March 03, 1946 DOA: 06/27/2019   PCP: [name], DO   Outpatient Specialists:   CARDS:  Dr. Sallyanne Kuster   Patient coming from: home Lives with family     Chief Complaint:  Chief Complaint  Patient presents with   Loss of Consciousness    HPI: [name] is a 73 y.o. female with medical history significant of diabetes, hypertension, hyperlipidemia, thoracic aortic aneurysm repair x2,  SINUS NODAL DYSFUNCTION pacemaker, moderate aortic stenosis. Presented with   could be that occurred today when she got out of shower she woke up being under her bed when she woke up she had a headache and confusion Was no prodrome she was not feeling lightheaded prior to falling down she is feeling overall weak and lightheaded no chest pain no numbness she is chronically on Xarelto. Denies melena no black stools. In ED Cardiology consulted and requesting medical admission though see patient in emergency department plan for EP cardiology to see in a.m. given likely cardiac syncope      Recent Labs  Lab 06/27/19 1815 06/28/19 0213  NA 133* 133*  K 4.1 3.9  CO2 21* 23  GLUCOSE 147* 138*  BUN 20 17  CREATININE 1.06* 0.82  CALCIUM 8.5* 8.3*  MG  --  2.0  PHOS  --  3.0    Cr  stable,    Lab Results  Component Value Date   CREATININE 0.82 06/28/2019   CREATININE 1.06 (H) 06/27/2019   CREATININE 1.11 (H) 05/05/2018    Recent Labs  Lab 06/28/19 0213  AST 26  ALT 12  ALKPHOS 49  BILITOT 0.9  PROT 8.5*  ALBUMIN 2.6*   Lab Results  Component Value Date   CALCIUM 8.3 (L) 06/28/2019   PHOS 3.0 06/28/2019      WBC      Component Value Date/Time   WBC 2.5 (L) 06/28/2019 0213   ANC    Component Value Date/Time   NEUTROABS 1.7 06/28/2019 0014      Plt: Lab Results  Component Value Date   PLT 124 (L) 06/28/2019      COVID-19 Labs    Lab Results  Component Value Date   SARSCOV2NAA NEGATIVE 06/27/2019      HG/HCT   Down        Component Value Date/Time   HGB 8.6 (L) 06/28/2019 0213   HCT 27.9 (L) 06/28/2019 0213      Troponin 44  Cardiac Panel (last 3 results) Recent Labs    06/27/19 2054  CKTOTAL 39     HbA1C: Recent Labs    06/28/19 0213  HGBA1C 5.5       CBG (last 3)  Recent Labs    06/27/19 2013 06/28/19 0045 06/28/19 0735  GLUCAP 109* 111* 99   UA  no evidence of UTI      Urine analysis:    Component Value Date/Time   COLORURINE AMBER (A) 06/27/2019 2020   APPEARANCEUR HAZY (A) 06/27/2019 2020   LABSPEC 1.018 06/27/2019 2020   PHURINE 5.0 06/27/2019 2020   GLUCOSEU >=500 (A) 06/27/2019 2020   HGBUR MODERATE (A) 06/27/2019 2020   HGBUR negative 12/09/2010 0936   BILIRUBINUR NEGATIVE 06/27/2019 2020   BILIRUBINUR Neg 01/21/2014 1502   [place] 06/27/2019 2020   PROTEINUR 30 (A) 06/27/2019 2020   UROBILINOGEN 0.2 01/21/2014 1502   UROBILINOGEN negative 12/09/2010 0936   NITRITE NEGATIVE 06/27/2019 2020   LEUKOCYTESUR NEGATIVE 06/27/2019 2020  CT HEAD/Neck  NON acute  CXR -  NON acute  EKG:  Personally reviewed by me showing: HR : 55  Rhythm:  Paced, LBBB  no evidence of ischemic changes QTC 482  Subjective:  No acute issues or events overnight, feels quite well currently; finds chest pain, shortness of breath, nausea, vomiting, diarrhea, constipation, headache, fevers, chills.   Prior to Admission medications   Medication Sig Start Date End Date Taking? Authorizing Provider  ALPRAZolam [name] 0.5 MG tablet Take 1 tablet (0.5 mg total) by mouth 3 (three) times daily as needed for anxiety. 06/06/19   Colon Branch, MD  carvedilol (COREG) 25 MG tablet TAKE 1 TABLET BY MOUTH TWICE A DAY WITH A MEAL 08/18/18   Croitoru, Mihai, MD  cholecalciferol (VITAMIN D) 1000 UNITS tablet Take 1,000 Units by mouth daily.    [provider]  citalopram (CELEXA) 20 MG tablet TAKE 3 TABLETS BY MOUTH EVERY DAY 12/07/18   Carollee Herter, Alferd Apa, DO  empagliflozin  (JARDIANCE) 10 MG TABS tablet Take 10 mg by mouth daily. 02/09/19   Bensimhon, Shaune Pascal, MD  fenofibrate 160 MG tablet TAKE 1 TABLET BY MOUTH EVERY DAY 06/22/19   Carollee Herter, Alferd Apa, DO  fluticasone [facility]) 50 MCG/ACT nasal spray USE 2 SPRAYS IN [facility] NOSTIRL EVERY DAY 06/23/18   Carollee Herter, Alferd Apa, DO  furosemide (LASIX) 40 MG tablet Take 1 tablet (40 mg total) by mouth daily. 02/09/19   Roma Schanz R, DO  hyoscyamine (LEVSIN SL) 0.125 MG SL tablet TAKE 1 TABLET BY MOUTH TWICE A DAY AS NEEDED FOR CRAMPING OR DIARRHEA OR LOOSE STOOLS 07/18/17   Carollee Herter, Alferd Apa, DO  lansoprazole (PREVACID) 15 MG capsule Take 15 mg by mouth as needed (for heart burn).     [provider]  losartan (COZAAR) 25 MG tablet Take 1 tablet (25 mg total) by mouth daily. 01/10/19 04/10/19  Bensimhon, Shaune Pascal, MD  magnesium oxide (MAG-OX) 400 MG tablet Take 400 mg by mouth daily.    [provider]  Omega-3 Fatty Acids (FISH OIL) 1000 MG CAPS Take by mouth.    [provider]  Polyethyl Glycol-Propyl Glycol (SYSTANE OP) Apply 1 drop to eye 4 (four) times daily - after meals and at bedtime.    [provider]  Probiotic Product (PRO-BIOTIC BLEND PO) Take 1 tablet by mouth as needed.     [provider]  rosuvastatin (CRESTOR) 20 MG tablet TAKE 1 TABLET BY MOUTH EVERY DAY 04/16/19   Carollee Herter, Alferd Apa, DO  sitaGLIPtin (JANUVIA) 100 MG tablet Take 1 tablet (100 mg total) by mouth daily. NO FURTHER REFILLS WITHOUT APPOINTMENT 06/07/19   Philemon Kingdom, MD  tiZANidine (ZANAFLEX) 4 MG tablet Take 1 tablet (4 mg total) by mouth every 6 (six) hours as needed for muscle spasms. 03/07/19   [name], DO  traMADol (ULTRAM) 50 MG tablet TAKE 1 TO 2 TABLETS BY MOUTH EVERY 6 HOURS AS NEEDED 03/07/19   Lowne Chase, Yvonne R, DO  XARELTO 20 MG TABS tablet TAKE 1 TABLET BY MOUTH EVERY DAY WITH SUPPER 11/13/18   Croitoru, Mihai, MD   Physical Exam: General:  Pleasantly  resting in bed, No acute distress. HEENT:  Normocephalic atraumatic.  Sclerae nonicteric, noninjected.  Extraocular movements intact bilaterally. Neck:  Without mass or deformity.  Trachea is midline. Lungs:  Clear to auscultate bilaterally without rhonchi, wheeze, or rales. Heart:  Regular rate and rhythm.  Without  murmurs, rubs, or gallops. Abdomen:  Soft, nontender, nondistended.  Without guarding or rebound. Extremities: Without cyanosis, clubbing, edema, or obvious deformity. Vascular:  Dorsalis pedis and posterior tibial pulses palpable bilaterally. Skin:  Warm and dry, no erythema, no ulcerations.  All other LABS:   Recent Labs  Lab 06/27/19 1815 06/28/19 0014 06/28/19 0213  WBC 3.9* 3.0* 2.5*  NEUTROABS  --  1.7  --   HGB 9.1* 8.7* 8.6*  HCT 30.1* 28.7* 27.9*  MCV 92.0 92.3 91.2  PLT 139* 125* 124*     Recent Labs  Lab 06/27/19 1815 06/28/19 0213  NA 133* 133*  K 4.1 3.9  CL 103 104  CO2 21* 23  GLUCOSE 147* 138*  BUN 20 17  CREATININE 1.06* 0.82  CALCIUM 8.5* 8.3*  MG  --  2.0  PHOS  --  3.0     Recent Labs  Lab 06/28/19 0213  AST 26  ALT 12  ALKPHOS 49  BILITOT 0.9  PROT 8.5*  ALBUMIN 2.6*       Cultures:    Component Value Date/Time   SDES URINE, CLEAN CATCH 05/05/2018 2131   SPECREQUEST  05/05/2018 2131    NONE Performed at [facility], [place] [address], [place], [place] [address]    CULT MULTIPLE SPECIES PRESENT, SUGGEST RECOLLECTION (A) 05/05/2018 2131   REPTSTATUS 05/07/2018 FINAL 05/05/2018 2131     Radiological Exams on Admission: Dg Chest 2 View  Result Date: 06/27/2019 CLINICAL DATA:  Fall in her bedroom. EXAM: CHEST - 2 VIEW COMPARISON:  Radiograph 06/18/2011, chest CT 01/27/2017 FINDINGS: Post median sternotomy. Left-sided pacemaker in place. Mild cardiomegaly with unchanged mediastinal contours. No acute airspace disease, pleural effusion, or pneumothorax. No acute osseous abnormalities are seen. IMPRESSION: Mild  cardiomegaly without acute abnormality. Electronically Signed   By: Keith Rake M.D.   On: 06/27/2019 22:03   Ct Head Wo Contrast  Result Date: 06/27/2019 CLINICAL DATA:  Head trauma, headache, anticoagulation EXAM: CT HEAD WITHOUT CONTRAST TECHNIQUE: Contiguous axial images were obtained from the base of the skull through the vertex without intravenous contrast. COMPARISON:  02/09/2013 FINDINGS: Brain: No evidence of acute infarction, hemorrhage, hydrocephalus, extra-axial collection or mass lesion/mass effect. Mild periventricular white matter hypodensity. Vascular: No hyperdense vessel or unexpected calcification. Skull: Normal. Negative for fracture or focal lesion. Sinuses/Orbits: No acute finding. Other: Soft tissue hematoma and laceration of the right forehead. IMPRESSION: 1.  No acute intracranial pathology. 2.  Soft tissue hematoma and laceration of the right forehead. Electronically Signed   By: Eddie Candle M.D.   On: 06/27/2019 20:00   Ct Cervical Spine Wo Contrast  Result Date: 06/27/2019 CLINICAL DATA:  Fall, headache EXAM: CT CERVICAL SPINE WITHOUT CONTRAST TECHNIQUE: Multidetector CT imaging of the cervical spine was performed without intravenous contrast. Multiplanar CT image reconstructions were also generated. COMPARISON:  None. FINDINGS: Alignment: Normal cervical lordosis. Skull base and vertebrae: No acute fracture. No primary bone lesion or focal pathologic process. Soft tissues and spinal canal: No prevertebral fluid or swelling. No visible canal hematoma. Disc levels: Mild degenerative changes of the mid/lower cervical spine. Spinal canal is patent. Upper chest: Visualized lung apices are clear. Other: Visualized thyroid is unremarkable. IMPRESSION: No evidence of traumatic injury to the cervical spine. Mild degenerative changes. Electronically Signed   By: Julian Hy M.D.   On: 06/27/2019 21:09   Ct Coronary Morph W/cta Cor W/score W/ca W/cm &/or Wo/cm  Result Date:  06/28/2019 EXAM: OVER-READ INTERPRETATION  CT  CHEST The following report is an over-read performed by radiologist Dr. Vinnie Langton of [facility], [place] on 06/28/2019. This over-read does not include interpretation of cardiac or coronary anatomy or pathology. The coronary calcium score/coronary CTA interpretation by the cardiologist is attached. COMPARISON:  CTA chest 01/27/2017. FINDINGS: Extracardiac findings will be described separately under dictation for contemporaneously obtained CTA chest, abdomen and pelvis. IMPRESSION: Please see separate dictation for contemporaneously obtained CTA chest, abdomen and pelvis dated 06/28/2019 for full description of relevant extracardiac findings. Electronically Signed   By: Vinnie Langton M.D.   On: 06/28/2019 14:41   Ct Angio Chest/abd/pel For Dissection W And/or W/wo  Result Date: 06/28/2019 CLINICAL DATA:  73 year old female with history of aortic valve replacement with known thoracic aortic dissection. EXAM: CT ANGIOGRAPHY CHEST, ABDOMEN AND PELVIS TECHNIQUE: Multidetector CT imaging through the chest, abdomen and pelvis was performed using the standard protocol during bolus administration of intravenous contrast. Multiplanar reconstructed images and MIPs were obtained and reviewed to evaluate the vascular anatomy. CONTRAST:  22mL OMNIPAQUE IOHEXOL 350 MG/ML SOLN COMPARISON:  Chest CT 05/28/2019. CTA chest, abdomen and pelvis 02/09/2018. FINDINGS: CTA CHEST FINDINGS Cardiovascular: Heart size is enlarged with left ventricular dilatation. There is no significant pericardial fluid, thickening or pericardial calcification. There is aortic atherosclerosis, as well as atherosclerosis of the great vessels of the mediastinum and the coronary arteries, including calcified atherosclerotic plaque in the left anterior descending and right coronary arteries. Thickening calcification of the aortic valve. Status post median sternotomy for graft replacement of the  ascending thoracic aorta. Chronic aortic dissection flap originating immediately distal to the graft involving the aortic arch and the descending thoracic aorta. The dissection flap does not extend into the proximal great vessels. Both the true and false lumen are patent. Ectasia of descending thoracic aorta (3.5 x 2.8 cm). Dissection flap extends into the pelvis (discussed below). Mediastinum/Lymph Nodes: Multiple prominent borderline enlarged mediastinal lymph nodes measuring up to 11 mm in short axis in the low right paratracheal nodal station, nonspecific. Esophagus is unremarkable in appearance. No axillary lymphadenopathy. Lungs/Pleura: No acute consolidative airspace disease. No suspicious. No pleural effusions appearing pulmonary nodules or masses. Musculoskeletal/Soft Tissues: Median sternotomy wires. There are no aggressive appearing lytic or blastic lesions noted in the visualized portions of the skeleton. CTA ABDOMEN AND PELVIS FINDINGS Hepatobiliary: No suspicious cystic or solid hepatic lesions. No intra or extrahepatic biliary ductal dilatation. Gallbladder is normal in appearance. Pancreas: No pancreatic mass. No pancreatic ductal dilatation. No pancreatic or peripancreatic fluid collections or inflammatory changes. Spleen: Unremarkable. Adrenals/Urinary Tract: Nonobstructive calculi within the left renal collecting system, largest of which is in the lower pole measuring 6 mm. Multiple low-attenuation lesions in the kidneys bilaterally, measuring up to 3.9 cm in the interpolar region of the right kidney, compatible with simple cysts. Other subcentimeter low-attenuation lesions in the kidneys bilaterally, too small to characterize, but statistically likely to represent tiny cysts. No hydroureteronephrosis. Urinary bladder is normal in appearance. Stomach/Bowel: Normal appearance of the stomach. No pathologic dilatation of small bowel or colon. Numerous colonic diverticulae are noted, particularly in  the sigmoid colon, without surrounding inflammatory changes to suggest an acute diverticulitis at this time. The appendix is not confidently identified and may be surgically absent. Regardless, there are no inflammatory changes noted adjacent to the cecum to suggest the presence of an acute appendicitis at this time. Vascular/Lymphatic: Aortic atherosclerosis with diffuse ectasia of the abdominal aorta measuring up to 2.8 x 2.2 cm with dissection flap extending  into the distal left common iliac artery. The true lumen feeds the celiac axis, superior mesenteric artery, inferior mesenteric artery and right renal artery, all of which are widely patent at this time. The false lumen feeds the left renal artery which is widely patent. Ectasia of left common iliac artery (which contains a dissection flap) which measures up to 1.7 x 1.1 cm. Vascular findings and measurements pertinent to potential TAVR procedure, as detailed below. No pathologically lymphadenopathy noted in the abdomen or pelvis. Reproductive: Status post hysterectomy. Ovaries are not confidently identified may be surgically absent or atrophic. Other: No significant volume of ascites.  No pneumoperitoneum. Musculoskeletal: There are no aggressive appearing lytic or blastic lesions noted in the visualized portions of the skeleton. VASCULAR MEASUREMENTS PERTINENT TO TAVR: AORTA: Minimal Aortic Diameter-1.4 x 1.2 cm mm (minimal diameter of the true lumen in the region of the infrarenal abdominal aorta) Severity of Aortic Calcification-moderate RIGHT PELVIS: Right Common Iliac Artery - Minimal Diameter-10.6 x 10.3 mm Tortuosity-mild Calcification-minimal Right External Iliac Artery - Minimal Diameter-6.7 x 6.7 mm Tortuosity-moderate Calcification-none Right Common Femoral Artery - Minimal Diameter-6.5 x 6.6 mm Tortuosity-mild Calcification-none LEFT PELVIS: Left Common Iliac Artery - Minimal Diameter-11.0 x 8.6 mm (true lumen minimal diameter) Tortuosity-mild  Calcification-minimal Left External Iliac Artery - Minimal Diameter-6.6 x 6.7 mm Tortuosity-moderate Calcification-none Left Common Femoral Artery - Minimal Diameter-6.3 x 6.0 mm Tortuosity-mild Calcification-none Review of the MIP images confirms the above findings. IMPRESSION: 1. Vascular findings and measurements pertinent to potential TAVR procedure, as detailed above. 2. Thickening calcification of the aortic valve, compatible with the reported clinical history of severe aortic stenosis. 3. Chronic thoracoabdominal aortic dissection status post graft repair in the ascending thoracic aorta, which appears similar to the prior examination, as detailed above. This dissection flap does not extend into the great vessels, but does extend into the distal aspect of the left common iliac artery. 4. Cardiomegaly with left ventricular dilatation. 5. Nonobstructive calculi within the left renal collecting system measuring up to 6 mm in the lower pole. 6. Colonic diverticulosis without evidence of acute diverticulitis at this time. 7. Additional incidental findings, as above. Electronically Signed   By: Vinnie Langton M.D.   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, status post graft repair the ascending aorta   GERD   Pacemaker - Medtronic Adapta dual chamber Sep 2014, initial implantation 2007   Paroxysmal atrial flutter ([place])   DM (diabetes mellitus) type II uncontrolled, periph vascular disorder (HCC)   Aortic valve stenosis, nonrheumatic   Severe obesity (BMI 35.0-35.9 with comorbidity) (HCC)   Anemia   Syncope   Elevated troponin  Likely cardiac syncope Cardiology following, considering this is a cardiac event given pacemaker evaluation - likely VT per cardiology note Upgrade from PPM to ICD pending later this afternoon  Anemia,  questionably acute given previous labs No signs/symptoms of bleeding Follow labs - iron panel unremarkable   Hyperlipidemia  LDL goal <100 chronic stable continue home medications  Essential hypertension  Chronic stable continue home medications  Proximal (type A.) dissection  Of the aorta with extension to the level of the pelvis Status post graft repair the ascending aorta   No chest pain continue to manage blood pressure currently stable  GERD - chronic stable continue home medications  Paroxysmal atrial flutter/fib, rate controlled CHA2DS2  vas score 4 : continue current anticoagulation with Xarelto,  Rate control:  Currently controlled with  Coreg will continue       DM (diabetes mellitus) type II uncontrolled, periph vascular disorder Order Sensitive  SSI  Check TSH and HgA1C Hold by mouth medications    Elevated troponin, mild. Other plan as per orders.  DVT prophylaxis: Xarelto Code Status: FULL CODE care as per patient   Family Communication: Family not at  Bedside Disposition Plan: Likely home pending clinical improvement and tolerance of new ICD per cardiology. Consults called: Cardiology aware  Admission status:  ED Disposition    ED Disposition Condition [place]: [place] [[id number]]  Level of Care: Telemetry Cardiac [103]  Covid Evaluation: Confirmed COVID Negative  Diagnosis: Syncope [[id number]]  Admitting Physician: Little Ishikawa [[id number]]  Attending Physician: Little Ishikawa [[id number]]  Estimated length of stay: past midnight tomorrow  Certification:: I certify this patient will need inpatient services for at least 2 midnights  PT Class (Do Not Modify): Inpatient [101]  PT Acc Code (Do Not Modify): Private [1]         Little Ishikawa 06/28/2019, 4:05 PM   [facility]

## 2019-06-28 NOTE — ED Notes (Signed)
Cardiology at bedside.

## 2019-06-28 NOTE — Consult Note (Addendum)
Cardiology Consultation:   Patient ID: [name] MRN: [id number]; DOB: 06/28/1946  Admit date: 06/27/2019 Date of Consult: 06/28/2019  Primary Care Provider: Carollee Herter, Alferd Apa, DO Primary Cardiologist: Sanda Klein, MD  AHF: Dr. Haroldine Laws Primary Electrophysiologist:  None    Patient Profile:   [name] is a 73 y.o. female with a hx of chronic CHF (diastolic) >> new NICM, DM,  h/o repaired type A aortic dissection (resuspended native aortic valve), SSS/sinus arrest w/PPM, HTN, AFlutter, morbid obesity who is being seen today for the evaluation of VT at the request of Dr. Vivia Ewing.  Device information MDT dual chamber PPM, implanted 2007, gen change 2014 Dr. Sallyanne Kuster Atrially dependent  History of Present Illness:   Ms. [name] most recently saw the AHF team, Feb 2020,  Dr. Haroldine Laws notes historically admitted to [facility] for 3 day history of fatigue, SOB, chills, and malaise with severe chest pain radiating to her back. CT findings concerning for worsening type-B dissection. CT (Outside hospital) concerning for worsening type-B dissection extending from just distal to the L subclavian artery down into the L CIA.   (Please note the [facility] notes go back and forth between calling it type-A and type-B). This is the read from Cardiology consult on 11/01/18.  Echo 10/29/18 showed LVEF down to 35% with mildly decreased RV, mild pHTN, and Mod/severe AS (low flo/low gradient).   L/RHC 11/02/18 with compensated filling pressures and stable CO. Dr. Haroldine Laws contacted with newly low EF for consultation as outpatient.  Plan was to continue to pursue BP control and no surgical intervention was recommended during that admission with relief of her chest pain. On 11/03/18, noted to have 11.9% AS-VP, and 71.7% AP-VP. AV delay increased to allow for less ventricular pacing.   At his visit in Feb, she was feeling better of [place] with no further light headeness.  Echo that day EF ~45%, ?  Etiology as ++ RV pacing vs Takotsubo cardiomyopathy in setting of "significant family stress"   The patient was brought to [facility] this admission via EMS after a syncopal event at home. EMS record reviewed, patient was found AAO, VSS, EKG was A paced/V sensing, no clear or acute looking changes noted LABS K+ 4.1, 3.9 Mag 2.0 BUN/Creat 17/0.82 HSTrop 44 > 40 > 32 > 32 WBC 3.9, 3.0, 2.5 Hgb 9.1 > 8.7 > 8.6 (Dec 2019 9.4-10) Plts 124 (chronically) TSH 0.893  COVID neg    Carelink interrogation: Battery and lead measurements are good AP 86% VP 2% PVCs noted VHR episodes 06/05/2019  5 seconds and 7 seconds 06/27/2019 4 seconds, 2:57minutes These are true VT episodes Only markers , no EGMs,  long VT episode EGM ends in VT, device reports 2:42 minutes  In d/w Dr. Loletha Grayer, chronic RV lead from 2007, by imaging is perforated (with no sequelae)  Home meds (rate/rhythm) Coreg 25mg  BID No h/o AAD   The patient reports feeling well, no CP, palpitations or SOB of late at all, she had justgotten out of the shower and the next thing she knew she was waking on the floor.  Total duration was about 20-67minutes of time lost, she did hit her head.  She woke confused about what had happened, had a headache/pain R forehead, though otherwise without symptoms  She has remained symptom free here as well. No symptoms of illness   Heart Pathway Score:     Past Medical History:  Diagnosis Date   Acute thoracic aortic dissection ([place]) 2008  emergency surgery - [facility]   Anxiety    Cancer [facility])    skin   Diabetes mellitus    Diverticulosis    GERD (gastroesophageal reflux disease)    Hemorrhoids    Hyperlipidemia    Hypertension    Kidney stones    Sinus node dysfunction ([place])    hx of PPM    Past Surgical History:  Procedure Laterality Date   ABDOMINAL HYSTERECTOMY  1984   APPENDECTOMY     EYE SURGERY  2014   Rght eye   PACEMAKER GENERATOR CHANGE  2014   Medtronic adapta     PACEMAKER GENERATOR CHANGE N/A 08/29/2013   Procedure: PACEMAKER GENERATOR CHANGE;  Surgeon: Sanda Klein, MD;  Location: [place] CATH LAB;  Service: Cardiovascular;  Laterality: N/A;   PACEMAKER INSERTION  08/2006   dual chamber Medtronic EnRhythm; r/t sinus node dysfunction    REPAIR OF ACUTE ASCENDING THORACIC AORTIC DISSECTION  2008   Dr. Servando Snare   THORACIC AORTIC ANEURYSM REPAIR  2000   type III   TRANSTHORACIC ECHOCARDIOGRAM  09/20/2012   EF=>55% with mild conc LVH; LA mod dilated; RA mildly dilated; mild MR/TR/AR     Home Medications:  Prior to Admission medications   Medication Sig Start Date End Date Taking? Authorizing Provider  ALPRAZolam [name] 0.5 MG tablet Take 1 tablet (0.5 mg total) by mouth 3 (three) times daily as needed for anxiety. 06/06/19  Yes Paz, Alda Berthold, MD  carvedilol (COREG) 25 MG tablet TAKE 1 TABLET BY MOUTH TWICE A DAY WITH A MEAL Patient taking differently: Take 25 mg by mouth 2 (two) times daily with a meal.  08/18/18  Yes Croitoru, Mihai, MD  cholecalciferol (VITAMIN D) 1000 UNITS tablet Take 1,000 Units by mouth daily.   Yes [provider]  citalopram (CELEXA) 20 MG tablet TAKE 3 TABLETS BY MOUTH EVERY DAY Patient taking differently: Take 60 mg by mouth daily.  12/07/18  Yes Roma Schanz R, DO  empagliflozin (JARDIANCE) 10 MG TABS tablet Take 10 mg by mouth daily. 02/09/19  Yes Bensimhon, Shaune Pascal, MD  fenofibrate 160 MG tablet TAKE 1 TABLET BY MOUTH EVERY DAY Patient taking differently: Take 160 mg by mouth at bedtime.  06/22/19  Yes Lowne Chase, Yvonne R, DO  fluticasone (FLONASE) 50 MCG/ACT nasal spray USE 2 SPRAYS IN [place] DAY Patient taking differently: Place 2 sprays into both nostrils daily as needed for allergies or rhinitis.  06/23/18  Yes Roma Schanz R, DO  furosemide (LASIX) 40 MG tablet Take 1 tablet (40 mg total) by mouth daily. Patient taking differently: Take 20 mg by mouth daily.  02/09/19  Yes Roma Schanz R, DO  hyoscyamine (LEVSIN SL) 0.125 MG SL tablet TAKE 1 TABLET BY MOUTH TWICE A DAY AS NEEDED FOR CRAMPING OR DIARRHEA OR LOOSE STOOLS Patient taking differently: Take 0.125 mg by mouth 2 (two) times daily as needed for cramping.  07/18/17  Yes Carollee Herter, Yvonne R, DO  losartan (COZAAR) 25 MG tablet Take 1 tablet (25 mg total) by mouth daily. 01/10/19 06/27/19 Yes Bensimhon, Shaune Pascal, MD  Omega-3 Fatty Acids (FISH OIL) 1000 MG CAPS Take 1 capsule by mouth daily.    Yes [provider]  Polyethyl Glycol-Propyl Glycol (SYSTANE OP) Apply 1 drop to eye 4 (four) times daily - after meals and at bedtime.   Yes [provider]  Probiotic Product (PRO-BIOTIC BLEND PO) Take 1 tablet by mouth daily.  Yes [provider]  rosuvastatin (CRESTOR) 20 MG tablet TAKE 1 TABLET BY MOUTH EVERY DAY Patient taking differently: Take 20 mg by mouth at bedtime.  04/16/19  Yes Lowne Chase, Yvonne R, DO  XARELTO 20 MG TABS tablet TAKE 1 TABLET BY MOUTH EVERY DAY WITH SUPPER Patient taking differently: Take 20 mg by mouth daily with supper.  11/13/18  Yes Croitoru, Mihai, MD  sitaGLIPtin (JANUVIA) 100 MG tablet Take 1 tablet (100 mg total) by mouth daily. NO FURTHER REFILLS WITHOUT APPOINTMENT Patient not taking: Reported on 06/27/2019 06/07/19   Philemon Kingdom, MD    Inpatient Medications: Scheduled Meds:  carvedilol  25 mg Oral BID WC   citalopram  20 mg Oral Daily   citalopram  60 mg Oral Daily   diclofenac sodium  2 g Topical QID   fenofibrate  160 mg Oral QHS   furosemide  20 mg Oral Daily   insulin aspart  0-5 Units Subcutaneous QHS   insulin aspart  0-9 Units Subcutaneous TID WC   losartan  25 mg Oral Daily   rivaroxaban  20 mg Oral Q supper   rosuvastatin  20 mg Oral QHS   sodium chloride flush  3 mL Intravenous Once   Continuous Infusions:  PRN Meds: acetaminophen **OR** acetaminophen, ALPRAZolam, ondansetron **OR** ondansetron (ZOFRAN) IV  Allergies:     Allergies  Allergen Reactions   Oxycodone Itching   Vicodin [Hydrocodone-Acetaminophen] Itching   Lidocaine-Aloe Vera Other (See Comments)    Felt like bees stinging her   Latex Rash    Itching/rash    Neomycin-Bacitracin Zn-Polymyx Other (See Comments)    Red , swollen eye   Sulfonamide Derivatives Other (See Comments)    Just had reaction when taking during pregnancy- last child birth 41    Social History:   Social History   Socioeconomic History   Marital status: Widowed    Spouse name: Not on file   Number of children: 2   Years of education: Not on file   Highest education level: Not on file  Occupational History    Employer: RETIRED  Social Needs   Financial resource strain: Not on file   Food insecurity    Worry: Not on file    Inability: Not on file   Transportation needs    Medical: Not on file    Non-medical: Not on file  Tobacco Use   Smoking status: Former Smoker    Quit date: 09/04/1993    Years since quitting: 25.8   Smokeless tobacco: Never Used  Substance and Sexual Activity   Alcohol use: No   Drug use: No   Sexual activity: Not Currently  Lifestyle   Physical activity    Days per week: Not on file    Minutes per session: Not on file   Stress: Not on file  Relationships   Social connections    Talks on phone: Not on file    Gets together: Not on file    Attends religious service: Not on file    Active member of club or organization: Not on file    Attends meetings of clubs or organizations: Not on file    Relationship status: Not on file   Intimate partner violence    Fear of current or ex partner: Not on file    Emotionally abused: Not on file    Physically abused: Not on file    Forced sexual activity: Not on file  Other Topics Concern   Not  on file  Social History Narrative   Lives at home and husband was just moved to [place]    Family History:   Family History  Problem Relation Age of Onset    Kidney disease Mother    Heart attack Father    Heart attack Maternal Grandmother    Valvular heart disease Son        valve replacement at 65   Cancer Neg Hx      ROS:  Please see the history of present illness.  All other ROS reviewed and negative.     Physical Exam/Data:   Vitals:   06/28/19 0825 06/28/19 0830 06/28/19 0900 06/28/19 0930  BP:  116/66 109/79 (!) 110/45  Pulse: (!) 55   (!) 55  Resp:    18  Temp:      TempSrc:      SpO2: 97%   96%   No intake or output data in the 24 hours ending 06/28/19 0959 Last 3 Weights 01/10/2019 12/06/2018 11/13/2018  Weight (lbs) 196 lb 3.2 oz 197 lb 3.2 oz 197 lb  Weight (kg) 88.996 kg 89.449 kg 89.359 kg     There is no height or weight on file to calculate BMI.  General:  Well nourished, well developed, in no acute distress HEENT: ecchymosis with some slight swelling R forehead, periorbital ecchymosis R eye Lymph: no adenopathy Neck: no JVD Endocrine:  No thryomegaly Vascular: No carotid bruits Cardiac:  RRR; 2/6 SM, no gallops or rubs Lungs: CTA b/l, no wheezing, rhonchi or rales  Abd: soft, nontender Ext: no edema Musculoskeletal:  No deformities Skin: warm and dry  Neuro:  no focal abnormalities noted Psych:  Normal affect   EKG:  The EKG was personally reviewed and demonstrates:   AP, VS, 1st degree AVblock, PR 356ms, LBBB, QRS 188ms  Telemetry:  Telemetry was personally reviewed and demonstrates:  AP/VS    Relevant CV Studies:  01/10/2019 TTE IMPRESSIONS  1. The left ventricle has low normal systolic function, with an ejection fraction of 50-55%. The cavity size was normal. There is mildly increased left ventricular wall thickness. Left ventricular diastolic Doppler parameters are consistent with  pseudonormalization.  2. The right ventricle has normal systolic function. The cavity was normal. There is no increase in right ventricular wall thickness.  3. Left atrial size was mildly dilated.  4. The  tricuspid valve is normal in structure.  5. The aortic valve is tricuspid There is severe thickening and severe calcifcation of the aortic valve. Aortic valve regurgitation is moderate by color flow Doppler. There is moderate stenosis of the aortic valve, peak velocity 3.25m/s and mean  gradient 20mmHg.  6. The pulmonic valve was normal in structure.  7. There is mild dilatation at the level of the sinuses of Valsalva measuring 38 mm.  8. Right atrial pressure is estimated at 3 mmHg.  9. The mitral valve is normal in structure.  FINDINGS  Left Ventricle: The left ventricle has low normal systolic function, with an ejection fraction of 50-55%. The cavity size was normal. There is mildly increased left ventricular wall thickness. Left ventricular diastolic Doppler parameters are consistent  with pseudonormalization Right Ventricle: The right ventricle has normal systolic function. The cavity was normal. There is no increase in right ventricular wall thickness. Left Atrium: left atrial size was mildly dilated Right Atrium: right atrial size was normal in size Right atrial pressure is estimated at 3 mmHg. Interatrial Septum: No atrial level shunt detected by  color flow Doppler. Pericardium: There is no evidence of pericardial effusion. Mitral Valve: The mitral valve is normal in structure. Mitral valve regurgitation is not visualized by color flow Doppler. Tricuspid Valve: The tricuspid valve is normal in structure. Tricuspid valve regurgitation was not visualized by color flow Doppler. Aortic Valve: The aortic valve is tricuspid There is severe thickening of the aortic valve. Aortic valve regurgitation is moderate by color flow Doppler. There is moderate stenosis of the aortic valve, with a calculated valve area of 0.83 cm. Pulmonic Valve: The pulmonic valve was normal in structure. Pulmonic valve regurgitation is trivial by color flow Doppler. Aorta: There is mild dilatation at the level of the  sinuses of Valsalva measuring 38 mm. Venous: The inferior vena cava is normal in size with greater than 50% respiratory variability.   LEFT VENTRICLE PLAX 2D (Teich) LV EF:          61.9 %   Diastology LVIDd:          4.80 cm  LV e' lateral:   6.42 cm/s LVIDs:          3.20 cm  LV E/e' lateral: 14.9 LV PW:          1.30 cm  LV e' medial:    4.03 cm/s LV IVS:         1.30 cm  LV E/e' medial:  23.7 LVOT diam:      1.80 cm LV SV:          67 ml LVOT Area:      2.54 cm  RIGHT VENTRICLE RV Basal diam:  2.60 cm RVSP:           28.4 mmHg  LEFT ATRIUM             Index       RIGHT ATRIUM           Index LA diam:        3.70 cm 1.99 cm/m  RA Pressure: 3 mmHg LA Vol (A2C):   63.8 ml 34.39 ml/m RA Area:     15.70 cm LA Vol (A4C):   70.7 ml 38.10 ml/m RA Volume:   41.10 ml  22.15 ml/m LA Biplane Vol: 67.7 ml 36.49 ml/m  AORTIC VALVE AV Area (Vmax):    0.86 cm AV Area (Vmean):   0.87 cm AV Area (VTI):     0.83 cm AV Vmax:           356.00 cm/s AV Vmean:          244.000 cm/s AV VTI:            0.953 m AV Peak Grad:      50.7 mmHg AV Mean Grad:      28.0 mmHg LVOT Vmax:         121.00 cm/s LVOT Vmean:        83.800 cm/s LVOT VTI:          0.312 m LVOT/AV VTI ratio: 0.33 AR PHT:            663 msec   AORTA Ao Root diam: 3.80 cm Ao Asc diam:  3.10 cm  MITRAL VALVE              TRICUSPID VALVE MV Area (PHT): 5.02 cm   TR Peak grad:   25.4 mmHg MV PHT:        43.79 msec TR Vmax:        252.00 cm/s MV  Decel Time: 151 msec   RVSP:           28.4 mmHg MV E velocity: 95.50 cm/s MV A velocity: 73.50 cm/s MV E/A ratio:  1.30      L/RHC 11/02/18 [facility]) - No angiographic evidence of significant disease.  RA mean 6 RV 45/10 PA 45/16 (26) PCWP 12 AO 90% Cardiac Output (Fick) 4.6 Cardiac Index (Fick) 2.4 SVR 1443  PVR 3 WU LVEDP 18 Mean AV gradient: 21.2 mmHg  Peak to peak gradient: 16 Valve area (gorlin): 1.1 cm2 (index 0.6)  Laboratory Data:  High Sensitivity  Troponin:   Recent Labs  Lab 06/27/19 2054 06/27/19 2201 06/28/19 0345 06/28/19 0816  TROPONINIHS 44* 40* 32* 32*     Cardiac EnzymesNo results for input(s): TROPONINI in the last 168 hours. No results for input(s): TROPIPOC in the last 168 hours.  Chemistry Recent Labs  Lab 06/27/19 1815 06/28/19 0213  NA 133* 133*  K 4.1 3.9  CL 103 104  CO2 21* 23  GLUCOSE 147* 138*  BUN 20 17  CREATININE 1.06* 0.82  CALCIUM 8.5* 8.3*  GFRNONAA 52* >60  GFRAA >60 >60  ANIONGAP 9 6    Recent Labs  Lab 06/28/19 0213  PROT 8.5*  ALBUMIN 2.6*  AST 26  ALT 12  ALKPHOS 49  BILITOT 0.9   Hematology Recent Labs  Lab 06/27/19 1815 06/28/19 0014 06/28/19 0213  WBC 3.9* 3.0* 2.5*  RBC 3.27* 3.11*   3.11* 3.06*  HGB 9.1* 8.7* 8.6*  HCT 30.1* 28.7* 27.9*  MCV 92.0 92.3 91.2  MCH 27.8 28.0 28.1  MCHC 30.2 30.3 30.8  RDW 18.2* 18.1* 18.1*  PLT 139* 125* 124*   BNPNo results for input(s): BNP, PROBNP in the last 168 hours.  DDimer No results for input(s): DDIMER in the last 168 hours.   Radiology/Studies:   Dg Chest 2 View Result Date: 06/27/2019 CLINICAL DATA:  Fall in her bedroom. EXAM: CHEST - 2 VIEW COMPARISON:  Radiograph 06/18/2011, chest CT 01/27/2017 FINDINGS: Post median sternotomy. Left-sided pacemaker in place. Mild cardiomegaly with unchanged mediastinal contours. No acute airspace disease, pleural effusion, or pneumothorax. No acute osseous abnormalities are seen. IMPRESSION: Mild cardiomegaly without acute abnormality. Electronically Signed   By: Keith Rake M.D.   On: 06/27/2019 22:03    Ct Head Wo Contrast Result Date: 06/27/2019 CLINICAL DATA:  Head trauma, headache, anticoagulation EXAM: CT HEAD WITHOUT CONTRAST TECHNIQUE: Contiguous axial images were obtained from the base of the skull through the vertex without intravenous contrast. COMPARISON:  02/09/2013 FINDINGS: Brain: No evidence of acute infarction, hemorrhage, hydrocephalus, extra-axial collection or  mass lesion/mass effect. Mild periventricular white matter hypodensity. Vascular: No hyperdense vessel or unexpected calcification. Skull: Normal. Negative for fracture or focal lesion. Sinuses/Orbits: No acute finding. Other: Soft tissue hematoma and laceration of the right forehead. IMPRESSION: 1.  No acute intracranial pathology. 2.  Soft tissue hematoma and laceration of the right forehead. Electronically Signed   By: Eddie Candle M.D.   On: 06/27/2019 20:00    Ct Cervical Spine Wo Contrast Result Date: 06/27/2019 CLINICAL DATA:  Fall, headache EXAM: CT CERVICAL SPINE WITHOUT CONTRAST TECHNIQUE: Multidetector CT imaging of the cervical spine was performed without intravenous contrast. Multiplanar CT image reconstructions were also generated. COMPARISON:  None. FINDINGS: Alignment: Normal cervical lordosis. Skull base and vertebrae: No acute fracture. No primary bone lesion or focal pathologic process. Soft tissues and spinal canal: No prevertebral fluid or swelling. No visible canal hematoma. Disc  levels: Mild degenerative changes of the mid/lower cervical spine. Spinal canal is patent. Upper chest: Visualized lung apices are clear. Other: Visualized thyroid is unremarkable. IMPRESSION: No evidence of traumatic injury to the cervical spine. Mild degenerative changes. Electronically Signed   By: Julian Hy M.D.   On: 06/27/2019 21:09    Assessment and Plan:   1. Syncope     R periorbital hematoma, no intracranial bleeding by CT 2. VT  10/2018, LHC w/no CAD Echos 11/2017, LVEF 55-60%, mod AS 10/2018 [facility]), LVEF 35%, mod-severe AS 12/2018 LVEF 50-55%, mod AS (45-50% with septal dyssynergy by Dr. Haroldine Laws)  Evaristo Bury look OK  Dr. Lovena Le has seen and examined the patient Recommend PPM upgrade to ICD the patient is agreeable. She is on this afternoon's schedule  Further management with IM, attending cardiology team  3. Known AS 4. Known thoracic aortic dissection     Ongoing w/u in  process      Discussion of possible TAVR 5. Anemia     Not far from December labs  6. AFlutter (remotely)     Xarelto held here       For questions or updates, please contact [place] Please consult [email or web address] for contact info under   Signed, Baldwin Jamaica, PA-C  06/28/2019 9:59 AM   EP Attending  Patient seen and examined. Agree with the findings as noted above. She has had VT at home and syncope with trauma as noted above. I have reviewed the malignant nature of the VT and recommended we proceed with ICD insertion for secondary prevention. The risks/benefits/goals/expectations of the procedure were reviewed and she is willing to proceed. She will receive a DDD ICD.   Mikle Bosworth.D.

## 2019-06-28 NOTE — Progress Notes (Signed)
[name]:[id number] DOB: 10/19/1946 DOA: 06/27/2019   PCP: [name], DO   Outpatient Specialists:   CARDS:  Dr. Sallyanne Kuster   Patient coming from: home Lives with family     Chief Complaint:  Chief Complaint  Patient presents with   Loss of Consciousness    HPI: [name] is a 73 y.o. female with medical history significant of diabetes, hypertension, hyperlipidemia, thoracic aortic aneurysm repair x2,  SINUS NODAL DYSFUNCTION pacemaker, moderate aortic stenosis. Presented with   could be that occurred today when she got out of shower she woke up being under her bed when she woke up she had a headache and confusion Was no prodrome she was not feeling lightheaded prior to falling down she is feeling overall weak and lightheaded no chest pain no numbness she is chronically on Xarelto. Denies melena no black stools. In ED Cardiology consulted and requesting medical admission though see patient in emergency department plan for EP cardiology to see in a.m. given likely cardiac syncope      Recent Labs  Lab 06/27/19 1815 06/28/19 0213  NA 133* 133*  K 4.1 3.9  CO2 21* 23  GLUCOSE 147* 138*  BUN 20 17  CREATININE 1.06* 0.82  CALCIUM 8.5* 8.3*  MG  --  2.0  PHOS  --  3.0    Cr  stable,    Lab Results  Component Value Date   CREATININE 0.82 06/28/2019   CREATININE 1.06 (H) 06/27/2019   CREATININE 1.11 (H) 05/05/2018    Recent Labs  Lab 06/28/19 0213  AST 26  ALT 12  ALKPHOS 49  BILITOT 0.9  PROT 8.5*  ALBUMIN 2.6*   Lab Results  Component Value Date   CALCIUM 8.3 (L) 06/28/2019   PHOS 3.0 06/28/2019      WBC      Component Value Date/Time   WBC 2.5 (L) 06/28/2019 0213   ANC    Component Value Date/Time   NEUTROABS 1.7 06/28/2019 0014      Plt: Lab Results  Component Value Date   PLT 124 (L) 06/28/2019      COVID-19 Labs    Lab Results  Component Value Date   SARSCOV2NAA NEGATIVE 06/27/2019      HG/HCT   Down        Component Value Date/Time   HGB 8.6 (L) 06/28/2019 0213   HCT 27.9 (L) 06/28/2019 0213      Troponin 44  Cardiac Panel (last 3 results) Recent Labs    06/27/19 2054  CKTOTAL 39     HbA1C: Recent Labs    06/28/19 0213  HGBA1C 5.5       CBG (last 3)  Recent Labs    06/27/19 2013 06/28/19 0045 06/28/19 0735  GLUCAP 109* 111* 99   UA  no evidence of UTI      Urine analysis:    Component Value Date/Time   COLORURINE AMBER (A) 06/27/2019 2020   APPEARANCEUR HAZY (A) 06/27/2019 2020   LABSPEC 1.018 06/27/2019 2020   PHURINE 5.0 06/27/2019 2020   GLUCOSEU >=500 (A) 06/27/2019 2020   HGBUR MODERATE (A) 06/27/2019 2020   HGBUR negative 12/09/2010 0936   BILIRUBINUR NEGATIVE 06/27/2019 2020   BILIRUBINUR Neg 01/21/2014 1502   [place] 06/27/2019 2020   PROTEINUR 30 (A) 06/27/2019 2020   UROBILINOGEN 0.2 01/21/2014 1502   UROBILINOGEN negative 12/09/2010 0936   NITRITE NEGATIVE 06/27/2019 2020   LEUKOCYTESUR NEGATIVE 06/27/2019 2020  CT HEAD/Neck  NON acute  CXR -  NON acute  EKG:  Personally reviewed by me showing: HR : 55  Rhythm:  Paced, LBBB  no evidence of ischemic changes QTC 482  Subjective:  No acute issues or events overnight, feels quite well currently; finds chest pain, shortness of breath, nausea, vomiting, diarrhea, constipation, headache, fevers, chills.   Prior to Admission medications   Medication Sig Start Date End Date Taking? Authorizing Provider  ALPRAZolam [name] 0.5 MG tablet Take 1 tablet (0.5 mg total) by mouth 3 (three) times daily as needed for anxiety. 06/06/19   Colon Branch, MD  carvedilol (COREG) 25 MG tablet TAKE 1 TABLET BY MOUTH TWICE A DAY WITH A MEAL 08/18/18   Croitoru, Mihai, MD  cholecalciferol (VITAMIN D) 1000 UNITS tablet Take 1,000 Units by mouth daily.    [provider]  citalopram (CELEXA) 20 MG tablet TAKE 3 TABLETS BY MOUTH EVERY DAY 12/07/18   Carollee Herter, Alferd Apa, DO  empagliflozin  (JARDIANCE) 10 MG TABS tablet Take 10 mg by mouth daily. 02/09/19   Bensimhon, Shaune Pascal, MD  fenofibrate 160 MG tablet TAKE 1 TABLET BY MOUTH EVERY DAY 06/22/19   Carollee Herter, Alferd Apa, DO  fluticasone [facility]) 50 MCG/ACT nasal spray USE 2 SPRAYS IN [facility] NOSTIRL EVERY DAY 06/23/18   Carollee Herter, Alferd Apa, DO  furosemide (LASIX) 40 MG tablet Take 1 tablet (40 mg total) by mouth daily. 02/09/19   Roma Schanz R, DO  hyoscyamine (LEVSIN SL) 0.125 MG SL tablet TAKE 1 TABLET BY MOUTH TWICE A DAY AS NEEDED FOR CRAMPING OR DIARRHEA OR LOOSE STOOLS 07/18/17   Carollee Herter, Alferd Apa, DO  lansoprazole (PREVACID) 15 MG capsule Take 15 mg by mouth as needed (for heart burn).     [provider]  losartan (COZAAR) 25 MG tablet Take 1 tablet (25 mg total) by mouth daily. 01/10/19 04/10/19  Bensimhon, Shaune Pascal, MD  magnesium oxide (MAG-OX) 400 MG tablet Take 400 mg by mouth daily.    [provider]  Omega-3 Fatty Acids (FISH OIL) 1000 MG CAPS Take by mouth.    [provider]  Polyethyl Glycol-Propyl Glycol (SYSTANE OP) Apply 1 drop to eye 4 (four) times daily - after meals and at bedtime.    [provider]  Probiotic Product (PRO-BIOTIC BLEND PO) Take 1 tablet by mouth as needed.     [provider]  rosuvastatin (CRESTOR) 20 MG tablet TAKE 1 TABLET BY MOUTH EVERY DAY 04/16/19   Carollee Herter, Alferd Apa, DO  sitaGLIPtin (JANUVIA) 100 MG tablet Take 1 tablet (100 mg total) by mouth daily. NO FURTHER REFILLS WITHOUT APPOINTMENT 06/07/19   Philemon Kingdom, MD  tiZANidine (ZANAFLEX) 4 MG tablet Take 1 tablet (4 mg total) by mouth every 6 (six) hours as needed for muscle spasms. 03/07/19   [name], DO  traMADol (ULTRAM) 50 MG tablet TAKE 1 TO 2 TABLETS BY MOUTH EVERY 6 HOURS AS NEEDED 03/07/19   Lowne Chase, Yvonne R, DO  XARELTO 20 MG TABS tablet TAKE 1 TABLET BY MOUTH EVERY DAY WITH SUPPER 11/13/18   Croitoru, Mihai, MD   Physical Exam: General:  Pleasantly  resting in bed, No acute distress. HEENT:  Normocephalic atraumatic.  Sclerae nonicteric, noninjected.  Extraocular movements intact bilaterally. Neck:  Without mass or deformity.  Trachea is midline. Lungs:  Clear to auscultate bilaterally without rhonchi, wheeze, or rales. Heart:  Regular rate and rhythm.  Without  murmurs, rubs, or gallops. Abdomen:  Soft, nontender, nondistended.  Without guarding or rebound. Extremities: Without cyanosis, clubbing, edema, or obvious deformity. Vascular:  Dorsalis pedis and posterior tibial pulses palpable bilaterally. Skin:  Warm and dry, no erythema, no ulcerations.  All other LABS:   Recent Labs  Lab 06/27/19 1815 06/28/19 0014 06/28/19 0213  WBC 3.9* 3.0* 2.5*  NEUTROABS  --  1.7  --   HGB 9.1* 8.7* 8.6*  HCT 30.1* 28.7* 27.9*  MCV 92.0 92.3 91.2  PLT 139* 125* 124*     Recent Labs  Lab 06/27/19 1815 06/28/19 0213  NA 133* 133*  K 4.1 3.9  CL 103 104  CO2 21* 23  GLUCOSE 147* 138*  BUN 20 17  CREATININE 1.06* 0.82  CALCIUM 8.5* 8.3*  MG  --  2.0  PHOS  --  3.0     Recent Labs  Lab 06/28/19 0213  AST 26  ALT 12  ALKPHOS 49  BILITOT 0.9  PROT 8.5*  ALBUMIN 2.6*       Cultures:    Component Value Date/Time   SDES URINE, CLEAN CATCH 05/05/2018 2131   SPECREQUEST  05/05/2018 2131    NONE Performed at [facility], [place] [address], [place], [place] [address]    CULT MULTIPLE SPECIES PRESENT, SUGGEST RECOLLECTION (A) 05/05/2018 2131   REPTSTATUS 05/07/2018 FINAL 05/05/2018 2131     Radiological Exams on Admission: Dg Chest 2 View  Result Date: 06/27/2019 CLINICAL DATA:  Fall in her bedroom. EXAM: CHEST - 2 VIEW COMPARISON:  Radiograph 06/18/2011, chest CT 01/27/2017 FINDINGS: Post median sternotomy. Left-sided pacemaker in place. Mild cardiomegaly with unchanged mediastinal contours. No acute airspace disease, pleural effusion, or pneumothorax. No acute osseous abnormalities are seen. IMPRESSION: Mild  cardiomegaly without acute abnormality. Electronically Signed   By: Keith Rake M.D.   On: 06/27/2019 22:03   Ct Head Wo Contrast  Result Date: 06/27/2019 CLINICAL DATA:  Head trauma, headache, anticoagulation EXAM: CT HEAD WITHOUT CONTRAST TECHNIQUE: Contiguous axial images were obtained from the base of the skull through the vertex without intravenous contrast. COMPARISON:  02/09/2013 FINDINGS: Brain: No evidence of acute infarction, hemorrhage, hydrocephalus, extra-axial collection or mass lesion/mass effect. Mild periventricular white matter hypodensity. Vascular: No hyperdense vessel or unexpected calcification. Skull: Normal. Negative for fracture or focal lesion. Sinuses/Orbits: No acute finding. Other: Soft tissue hematoma and laceration of the right forehead. IMPRESSION: 1.  No acute intracranial pathology. 2.  Soft tissue hematoma and laceration of the right forehead. Electronically Signed   By: Eddie Candle M.D.   On: 06/27/2019 20:00   Ct Cervical Spine Wo Contrast  Result Date: 06/27/2019 CLINICAL DATA:  Fall, headache EXAM: CT CERVICAL SPINE WITHOUT CONTRAST TECHNIQUE: Multidetector CT imaging of the cervical spine was performed without intravenous contrast. Multiplanar CT image reconstructions were also generated. COMPARISON:  None. FINDINGS: Alignment: Normal cervical lordosis. Skull base and vertebrae: No acute fracture. No primary bone lesion or focal pathologic process. Soft tissues and spinal canal: No prevertebral fluid or swelling. No visible canal hematoma. Disc levels: Mild degenerative changes of the mid/lower cervical spine. Spinal canal is patent. Upper chest: Visualized lung apices are clear. Other: Visualized thyroid is unremarkable. IMPRESSION: No evidence of traumatic injury to the cervical spine. Mild degenerative changes. Electronically Signed   By: Julian Hy M.D.   On: 06/27/2019 21:09   Ct Coronary Morph W/cta Cor W/score W/ca W/cm &/or Wo/cm  Result Date:  06/28/2019 EXAM: OVER-READ INTERPRETATION  CT  CHEST The following report is an over-read performed by radiologist Dr. Vinnie Langton of [facility], [place] on 06/28/2019. This over-read does not include interpretation of cardiac or coronary anatomy or pathology. The coronary calcium score/coronary CTA interpretation by the cardiologist is attached. COMPARISON:  CTA chest 01/27/2017. FINDINGS: Extracardiac findings will be described separately under dictation for contemporaneously obtained CTA chest, abdomen and pelvis. IMPRESSION: Please see separate dictation for contemporaneously obtained CTA chest, abdomen and pelvis dated 06/28/2019 for full description of relevant extracardiac findings. Electronically Signed   By: Vinnie Langton M.D.   On: 06/28/2019 14:41   Ct Angio Chest/abd/pel For Dissection W And/or W/wo  Result Date: 06/28/2019 CLINICAL DATA:  73 year old female with history of aortic valve replacement with known thoracic aortic dissection. EXAM: CT ANGIOGRAPHY CHEST, ABDOMEN AND PELVIS TECHNIQUE: Multidetector CT imaging through the chest, abdomen and pelvis was performed using the standard protocol during bolus administration of intravenous contrast. Multiplanar reconstructed images and MIPs were obtained and reviewed to evaluate the vascular anatomy. CONTRAST:  57mL OMNIPAQUE IOHEXOL 350 MG/ML SOLN COMPARISON:  Chest CT 05/28/2019. CTA chest, abdomen and pelvis 02/09/2018. FINDINGS: CTA CHEST FINDINGS Cardiovascular: Heart size is enlarged with left ventricular dilatation. There is no significant pericardial fluid, thickening or pericardial calcification. There is aortic atherosclerosis, as well as atherosclerosis of the great vessels of the mediastinum and the coronary arteries, including calcified atherosclerotic plaque in the left anterior descending and right coronary arteries. Thickening calcification of the aortic valve. Status post median sternotomy for graft replacement of the  ascending thoracic aorta. Chronic aortic dissection flap originating immediately distal to the graft involving the aortic arch and the descending thoracic aorta. The dissection flap does not extend into the proximal great vessels. Both the true and false lumen are patent. Ectasia of descending thoracic aorta (3.5 x 2.8 cm). Dissection flap extends into the pelvis (discussed below). Mediastinum/Lymph Nodes: Multiple prominent borderline enlarged mediastinal lymph nodes measuring up to 11 mm in short axis in the low right paratracheal nodal station, nonspecific. Esophagus is unremarkable in appearance. No axillary lymphadenopathy. Lungs/Pleura: No acute consolidative airspace disease. No suspicious. No pleural effusions appearing pulmonary nodules or masses. Musculoskeletal/Soft Tissues: Median sternotomy wires. There are no aggressive appearing lytic or blastic lesions noted in the visualized portions of the skeleton. CTA ABDOMEN AND PELVIS FINDINGS Hepatobiliary: No suspicious cystic or solid hepatic lesions. No intra or extrahepatic biliary ductal dilatation. Gallbladder is normal in appearance. Pancreas: No pancreatic mass. No pancreatic ductal dilatation. No pancreatic or peripancreatic fluid collections or inflammatory changes. Spleen: Unremarkable. Adrenals/Urinary Tract: Nonobstructive calculi within the left renal collecting system, largest of which is in the lower pole measuring 6 mm. Multiple low-attenuation lesions in the kidneys bilaterally, measuring up to 3.9 cm in the interpolar region of the right kidney, compatible with simple cysts. Other subcentimeter low-attenuation lesions in the kidneys bilaterally, too small to characterize, but statistically likely to represent tiny cysts. No hydroureteronephrosis. Urinary bladder is normal in appearance. Stomach/Bowel: Normal appearance of the stomach. No pathologic dilatation of small bowel or colon. Numerous colonic diverticulae are noted, particularly in  the sigmoid colon, without surrounding inflammatory changes to suggest an acute diverticulitis at this time. The appendix is not confidently identified and may be surgically absent. Regardless, there are no inflammatory changes noted adjacent to the cecum to suggest the presence of an acute appendicitis at this time. Vascular/Lymphatic: Aortic atherosclerosis with diffuse ectasia of the abdominal aorta measuring up to 2.8 x 2.2 cm with dissection flap extending  into the distal left common iliac artery. The true lumen feeds the celiac axis, superior mesenteric artery, inferior mesenteric artery and right renal artery, all of which are widely patent at this time. The false lumen feeds the left renal artery which is widely patent. Ectasia of left common iliac artery (which contains a dissection flap) which measures up to 1.7 x 1.1 cm. Vascular findings and measurements pertinent to potential TAVR procedure, as detailed below. No pathologically lymphadenopathy noted in the abdomen or pelvis. Reproductive: Status post hysterectomy. Ovaries are not confidently identified may be surgically absent or atrophic. Other: No significant volume of ascites.  No pneumoperitoneum. Musculoskeletal: There are no aggressive appearing lytic or blastic lesions noted in the visualized portions of the skeleton. VASCULAR MEASUREMENTS PERTINENT TO TAVR: AORTA: Minimal Aortic Diameter-1.4 x 1.2 cm mm (minimal diameter of the true lumen in the region of the infrarenal abdominal aorta) Severity of Aortic Calcification-moderate RIGHT PELVIS: Right Common Iliac Artery - Minimal Diameter-10.6 x 10.3 mm Tortuosity-mild Calcification-minimal Right External Iliac Artery - Minimal Diameter-6.7 x 6.7 mm Tortuosity-moderate Calcification-none Right Common Femoral Artery - Minimal Diameter-6.5 x 6.6 mm Tortuosity-mild Calcification-none LEFT PELVIS: Left Common Iliac Artery - Minimal Diameter-11.0 x 8.6 mm (true lumen minimal diameter) Tortuosity-mild  Calcification-minimal Left External Iliac Artery - Minimal Diameter-6.6 x 6.7 mm Tortuosity-moderate Calcification-none Left Common Femoral Artery - Minimal Diameter-6.3 x 6.0 mm Tortuosity-mild Calcification-none Review of the MIP images confirms the above findings. IMPRESSION: 1. Vascular findings and measurements pertinent to potential TAVR procedure, as detailed above. 2. Thickening calcification of the aortic valve, compatible with the reported clinical history of severe aortic stenosis. 3. Chronic thoracoabdominal aortic dissection status post graft repair in the ascending thoracic aorta, which appears similar to the prior examination, as detailed above. This dissection flap does not extend into the great vessels, but does extend into the distal aspect of the left common iliac artery. 4. Cardiomegaly with left ventricular dilatation. 5. Nonobstructive calculi within the left renal collecting system measuring up to 6 mm in the lower pole. 6. Colonic diverticulosis without evidence of acute diverticulitis at this time. 7. Additional incidental findings, as above. Electronically Signed   By: Vinnie Langton M.D.   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, status post graft repair the ascending aorta   GERD   Pacemaker - Medtronic Adapta dual chamber Sep 2014, initial implantation 2007   Paroxysmal atrial flutter ([place])   DM (diabetes mellitus) type II uncontrolled, periph vascular disorder (HCC)   Aortic valve stenosis, nonrheumatic   Severe obesity (BMI 35.0-35.9 with comorbidity) (HCC)   Anemia   Syncope   Elevated troponin  Likely cardiac syncope Cardiology following, considering this is a cardiac event given pacemaker evaluation - likely VT per cardiology note Upgrade from PPM to ICD pending later this afternoon  Anemia,  questionably acute given previous labs No signs/symptoms of bleeding Follow labs - iron panel unremarkable   Hyperlipidemia  LDL goal <100 chronic stable continue home medications  Essential hypertension  Chronic stable continue home medications  Proximal (type A.) dissection  Of the aorta with extension to the level of the pelvis Status post graft repair the ascending aorta   No chest pain continue to manage blood pressure currently stable  GERD - chronic stable continue home medications  Paroxysmal atrial flutter/fib, rate controlled CHA2DS2  vas score 4 : continue current anticoagulation with Xarelto,  Rate control:  Currently controlled with  Coreg will continue       DM (diabetes mellitus) type II uncontrolled, periph vascular disorder Order Sensitive  SSI  Check TSH and HgA1C Hold by mouth medications    Elevated troponin, mild. Other plan as per orders.  DVT prophylaxis: Xarelto Code Status: FULL CODE care as per patient   Family Communication: Family not at  Bedside Disposition Plan: Likely home pending clinical improvement and tolerance of new ICD per cardiology. Consults called: Cardiology aware  Admission status:  ED Disposition    ED Disposition Condition [place]: [place] [[id number]]  Level of Care: Telemetry Cardiac [103]  Covid Evaluation: Confirmed COVID Negative  Diagnosis: Syncope [203559]  Admitting Physician: Little Ishikawa [[id number]]  Attending Physician: Little Ishikawa [[id number]]  Estimated length of stay: past midnight tomorrow  Certification:: I certify this patient will need inpatient services for at least 2 midnights  PT Class (Do Not Modify): Inpatient [101]  PT Acc Code (Do Not Modify): Private [1]         Little Ishikawa 06/28/2019, 4:33 PM   [facility]

## 2019-06-28 NOTE — ED Notes (Signed)
Nurse transported pt to cath lab

## 2019-06-28 NOTE — ED Notes (Signed)
CT called will send transport to have CT scan now.

## 2019-06-28 NOTE — ED Notes (Signed)
ECHO completed and patient transported to CT.

## 2019-06-28 NOTE — Interval H&P Note (Signed)
History and Physical Interval Note:  06/28/2019 2:08 PM  [name]  has presented today for surgery, with the diagnosis of vt.  The various methods of treatment have been discussed with the patient and family. After consideration of risks, benefits and other options for treatment, the patient has consented to  Procedure(s): PPM upgrade to ICD (N/A) as a surgical intervention.  The patient's history has been reviewed, patient examined, no change in status, stable for surgery.  I have reviewed the patient's chart and labs.  Questions were answered to the patient's satisfaction.     Cristopher Peru

## 2019-06-28 NOTE — ED Notes (Signed)
Lunch tray ordered 

## 2019-06-28 NOTE — H&P (View-Only) (Signed)
Cardiology Consultation:   Patient ID: KAEDYNCE TAPP MRN: [id number]; DOB: Mar 17, 1946  Admit date: 06/27/2019 Date of Consult: 06/28/2019  Primary Care Provider: Carollee [name], Anna Apa, [name] Primary Cardiologist: Anna Klein, MD  AHF: Dr. Haroldine [name] Primary Electrophysiologist:  None    Patient Profile:   [name] is a 73 y.o. female with a hx of chronic CHF (diastolic) >> new NICM, DM,  h/o repaired type A aortic dissection (resuspended native aortic valve), SSS/sinus arrest w/PPM, HTN, AFlutter, morbid obesity who is being seen today for the evaluation of VT at the request of Dr. Vivia [name].  Device information MDT dual chamber PPM, implanted 2007, gen change 2014 [name] Atrially dependent  History of Present Illness:   [name] most recently saw the AHF team, Feb 2020,  Dr. Haroldine [name] notes historically admitted to [facility] for 3 day history of fatigue, SOB, chills, and malaise with severe chest pain radiating to her back. CT findings concerning for worsening type-B dissection. CT (Outside hospital) concerning for worsening type-B dissection extending from just distal to the L subclavian artery down into the L CIA.   (Please note the [facility] notes go back and forth between calling it type-A and type-B). This is the read from Cardiology consult on 11/01/18.  Echo 10/29/18 showed LVEF down to 35% with mildly decreased RV, mild pHTN, and Mod/severe AS (low flo/low gradient).   L/RHC 11/02/18 with compensated filling pressures and stable CO. Dr. Haroldine [name] contacted with newly low EF for consultation as outpatient.  Plan was to continue to pursue BP control and no surgical intervention was recommended during that admission with relief of her chest pain. On 11/03/18, noted to have 11.9% AS-VP, and 71.7% AP-VP. AV delay increased to allow for less ventricular pacing.   At his visit in Feb, she was feeling better of Benicar with no further light headeness.  Echo that day EF ~45%, ?  Etiology as ++ RV pacing vs Takotsubo cardiomyopathy in setting of "significant family stress"   The patient was brought to [facility] this admission via EMS after a syncopal event at home. EMS record reviewed, patient was found AAO, VSS, EKG was A paced/V sensing, no clear or acute looking changes noted LABS K+ 4.1, 3.9 Mag 2.0 BUN/Creat 17/0.82 HSTrop 44 > 40 > 32 > 32 WBC 3.9, 3.0, 2.5 Hgb 9.1 > 8.7 > 8.6 (Dec 2019 9.4-10) Plts 124 (chronically) TSH 0.893  COVID neg    Carelink interrogation: Battery and lead measurements are good AP 86% VP 2% PVCs noted VHR episodes 06/05/2019  5 seconds and 7 seconds 06/27/2019 4 seconds, 2:90minutes These are true VT episodes Only markers , no EGMs,  long VT episode EGM ends in VT, device reports 2:42 minutes  In d/w [name], chronic RV lead from 2007, by imaging is perforated (with no sequelae)  Home meds (rate/rhythm) Coreg 25mg  BID No h/o AAD   The patient reports feeling well, no CP, palpitations or SOB of late at all, she had justgotten out of the shower and the next thing she knew she was waking on the floor.  Total duration was about 20-1minutes of time lost, she did hit her head.  She woke confused about what had happened, had a headache/pain [name] forehead, though otherwise without symptoms  She has remained symptom free here as well. No symptoms of illness   Heart Pathway Score:     Past Medical History:  Diagnosis Date   Acute thoracic aortic dissection ([place]) 2008  emergency surgery - [name]   Anxiety    Cancer [facility])    skin   Diabetes mellitus    Diverticulosis    GERD (gastroesophageal reflux disease)    Hemorrhoids    Hyperlipidemia    Hypertension    Kidney stones    Sinus node dysfunction ([place])    hx of PPM    Past Surgical History:  Procedure Laterality Date   ABDOMINAL HYSTERECTOMY  1984   APPENDECTOMY     EYE SURGERY  2014   Rght eye   PACEMAKER GENERATOR CHANGE  2014   Medtronic adapta     PACEMAKER GENERATOR CHANGE N/A 08/29/2013   Procedure: PACEMAKER GENERATOR CHANGE;  Surgeon: Anna Klein, MD;  Location: [place] CATH LAB;  Service: Cardiovascular;  Laterality: N/A;   PACEMAKER INSERTION  08/2006   dual chamber Medtronic EnRhythm; [name]/t sinus node dysfunction    REPAIR OF ACUTE ASCENDING THORACIC AORTIC DISSECTION  2008   Dr. Servando [name]   THORACIC AORTIC ANEURYSM REPAIR  2000   type III   TRANSTHORACIC ECHOCARDIOGRAM  09/20/2012   EF=>55% with mild conc LVH; LA mod dilated; RA mildly dilated; mild MR/TR/AR     Home Medications:  Prior to Admission medications   Medication Sig Start Date End Date Taking? Authorizing Provider  ALPRAZolam [name] 0.5 MG tablet Take 1 tablet (0.5 mg total) by mouth 3 (three) times daily as needed for anxiety. 06/06/19  Yes Paz, Alda Berthold, MD  carvedilol (COREG) 25 MG tablet TAKE 1 TABLET BY MOUTH TWICE A DAY WITH A MEAL Patient taking differently: Take 25 mg by mouth 2 (two) times daily with a meal.  08/18/18  Yes Croitoru, Mihai, MD  cholecalciferol (VITAMIN D) 1000 UNITS tablet Take 1,000 Units by mouth daily.   Yes [provider]  citalopram (CELEXA) 20 MG tablet TAKE 3 TABLETS BY MOUTH EVERY DAY Patient taking differently: Take 60 mg by mouth daily.  12/07/18  Yes [name]  empagliflozin (JARDIANCE) 10 MG TABS tablet Take 10 mg by mouth daily. 02/09/19  Yes Bensimhon, Shaune Pascal, MD  fenofibrate 160 MG tablet TAKE 1 TABLET BY MOUTH EVERY DAY Patient taking differently: Take 160 mg by mouth at bedtime.  06/22/19  Yes Anna Chase, Yvonne [name], [name]  fluticasone (FLONASE) 50 MCG/ACT nasal spray USE 2 SPRAYS IN [place] DAY Patient taking differently: Place 2 sprays into both nostrils daily as needed for allergies or rhinitis.  06/23/18  Yes [name]  furosemide (LASIX) 40 MG tablet Take 1 tablet (40 mg total) by mouth daily. Patient taking differently: Take 20 mg by mouth daily.  02/09/19  Yes [name]  hyoscyamine (LEVSIN SL) 0.125 MG SL tablet TAKE 1 TABLET BY MOUTH TWICE A DAY AS NEEDED FOR CRAMPING OR DIARRHEA OR LOOSE STOOLS Patient taking differently: Take 0.125 mg by mouth 2 (two) times daily as needed for cramping.  07/18/17  Yes [name], Yvonne [name], [name]  losartan (COZAAR) 25 MG tablet Take 1 tablet (25 mg total) by mouth daily. 01/10/19 06/27/19 Yes Bensimhon, Shaune Pascal, MD  Omega-3 Fatty Acids (FISH OIL) 1000 MG CAPS Take 1 capsule by mouth daily.    Yes [provider]  Polyethyl Glycol-Propyl Glycol (SYSTANE OP) Apply 1 drop to eye 4 (four) times daily - after meals and at bedtime.   Yes [provider]  Probiotic Product (PRO-BIOTIC BLEND PO) Take 1 tablet by mouth daily.  Yes [provider]  rosuvastatin (CRESTOR) 20 MG tablet TAKE 1 TABLET BY MOUTH EVERY DAY Patient taking differently: Take 20 mg by mouth at bedtime.  04/16/19  Yes Anna Chase, Yvonne [name], [name]  XARELTO 20 MG TABS tablet TAKE 1 TABLET BY MOUTH EVERY DAY WITH SUPPER Patient taking differently: Take 20 mg by mouth daily with supper.  11/13/18  Yes Croitoru, Mihai, MD  sitaGLIPtin (JANUVIA) 100 MG tablet Take 1 tablet (100 mg total) by mouth daily. NO FURTHER REFILLS WITHOUT APPOINTMENT Patient not taking: Reported on 06/27/2019 06/07/19   Philemon Kingdom, MD    Inpatient Medications: Scheduled Meds:  carvedilol  25 mg Oral BID WC   citalopram  20 mg Oral Daily   citalopram  60 mg Oral Daily   diclofenac sodium  2 g Topical QID   fenofibrate  160 mg Oral QHS   furosemide  20 mg Oral Daily   insulin aspart  0-5 Units Subcutaneous QHS   insulin aspart  0-9 Units Subcutaneous TID WC   losartan  25 mg Oral Daily   rivaroxaban  20 mg Oral Q supper   rosuvastatin  20 mg Oral QHS   sodium chloride flush  3 mL Intravenous Once   Continuous Infusions:  PRN Meds: acetaminophen **OR** acetaminophen, ALPRAZolam, ondansetron **OR** ondansetron (ZOFRAN) IV  Allergies:     Allergies  Allergen Reactions   Oxycodone Itching   Vicodin [Hydrocodone-Acetaminophen] Itching   Lidocaine-Aloe Vera Other (See Comments)    Felt like bees stinging her   Latex Rash    Itching/rash    Neomycin-Bacitracin Zn-Polymyx Other (See Comments)    Red , swollen eye   Sulfonamide Derivatives Other (See Comments)    Just had reaction when taking during pregnancy- last child birth 69    Social History:   Social History   Socioeconomic History   Marital status: Widowed    Spouse name: Not on file   Number of children: 2   Years of education: Not on file   Highest education level: Not on file  Occupational History    Employer: RETIRED  Social Needs   Financial resource strain: Not on file   Food insecurity    Worry: Not on file    Inability: Not on file   Transportation needs    Medical: Not on file    Non-medical: Not on file  Tobacco Use   Smoking status: Former Smoker    Quit date: 09/04/1993    Years since quitting: 25.8   Smokeless tobacco: Never Used  Substance and Sexual Activity   Alcohol use: No   Drug use: No   Sexual activity: Not Currently  Lifestyle   Physical activity    Days per week: Not on file    Minutes per session: Not on file   Stress: Not on file  Relationships   Social connections    Talks on phone: Not on file    Gets together: Not on file    Attends religious service: Not on file    Active member of club or organization: Not on file    Attends meetings of clubs or organizations: Not on file    Relationship status: Not on file   Intimate partner violence    Fear of current or ex partner: Not on file    Emotionally abused: Not on file    Physically abused: Not on file    Forced sexual activity: Not on file  Other Topics Concern   Not  on file  Social History Narrative   Lives at home and husband was just moved to [place]    Family History:   Family History  Problem Relation Age of Onset    Kidney disease Mother    Heart attack Father    Heart attack Maternal Grandmother    Valvular heart disease Son        valve replacement at 60   Cancer Neg Hx      ROS:  Please see the history of present illness.  All other ROS reviewed and negative.     Physical Exam/Data:   Vitals:   06/28/19 0825 06/28/19 0830 06/28/19 0900 06/28/19 0930  BP:  116/66 109/79 (!) 110/45  Pulse: (!) 55   (!) 55  Resp:    18  Temp:      TempSrc:      SpO2: 97%   96%   No intake or output data in the 24 hours ending 06/28/19 0959 Last 3 Weights 01/10/2019 12/06/2018 11/13/2018  Weight (lbs) 196 lb 3.2 oz 197 lb 3.2 oz 197 lb  Weight (kg) 88.996 kg 89.449 kg 89.359 kg     There is no height or weight on file to calculate BMI.  General:  Well nourished, well developed, in no acute distress HEENT: ecchymosis with some slight swelling [name] forehead, periorbital ecchymosis [name] eye Lymph: no adenopathy Neck: no JVD Endocrine:  No thryomegaly Vascular: No carotid bruits Cardiac:  RRR; 2/6 SM, no gallops or rubs Lungs: CTA b/l, no wheezing, rhonchi or rales  Abd: soft, nontender Ext: no edema Musculoskeletal:  No deformities Skin: warm and dry  Neuro:  no focal abnormalities noted Psych:  Normal affect   EKG:  The EKG was personally reviewed and demonstrates:   AP, VS, 1st degree AVblock, PR 363ms, LBBB, QRS 181ms  Telemetry:  Telemetry was personally reviewed and demonstrates:  AP/VS    Relevant CV Studies:  01/10/2019 TTE IMPRESSIONS  1. The left ventricle has low normal systolic function, with an ejection fraction of 50-55%. The cavity size was normal. There is mildly increased left ventricular wall thickness. Left ventricular diastolic Doppler parameters are consistent with  pseudonormalization.  2. The right ventricle has normal systolic function. The cavity was normal. There is no increase in right ventricular wall thickness.  3. Left atrial size was mildly dilated.  4. The  tricuspid valve is normal in structure.  5. The aortic valve is tricuspid There is severe thickening and severe calcifcation of the aortic valve. Aortic valve regurgitation is moderate by color flow Doppler. There is moderate stenosis of the aortic valve, peak velocity 3.84m/s and mean  gradient 36mmHg.  6. The pulmonic valve was normal in structure.  7. There is mild dilatation at the level of the sinuses of Valsalva measuring 38 mm.  8. Right atrial pressure is estimated at 3 mmHg.  9. The mitral valve is normal in structure.  FINDINGS  Left Ventricle: The left ventricle has low normal systolic function, with an ejection fraction of 50-55%. The cavity size was normal. There is mildly increased left ventricular wall thickness. Left ventricular diastolic Doppler parameters are consistent  with pseudonormalization Right Ventricle: The right ventricle has normal systolic function. The cavity was normal. There is no increase in right ventricular wall thickness. Left Atrium: left atrial size was mildly dilated Right Atrium: right atrial size was normal in size Right atrial pressure is estimated at 3 mmHg. Interatrial Septum: No atrial level shunt detected by  color flow Doppler. Pericardium: There is no evidence of pericardial effusion. Mitral Valve: The mitral valve is normal in structure. Mitral valve regurgitation is not visualized by color flow Doppler. Tricuspid Valve: The tricuspid valve is normal in structure. Tricuspid valve regurgitation was not visualized by color flow Doppler. Aortic Valve: The aortic valve is tricuspid There is severe thickening of the aortic valve. Aortic valve regurgitation is moderate by color flow Doppler. There is moderate stenosis of the aortic valve, with a calculated valve area of 0.83 cm. Pulmonic Valve: The pulmonic valve was normal in structure. Pulmonic valve regurgitation is trivial by color flow Doppler. Aorta: There is mild dilatation at the level of the  sinuses of Valsalva measuring 38 mm. Venous: The inferior vena cava is normal in size with greater than 50% respiratory variability.   LEFT VENTRICLE PLAX 2D (Teich) LV EF:          61.9 %   Diastology LVIDd:          4.80 cm  LV e' lateral:   6.42 cm/s LVIDs:          3.20 cm  LV E/e' lateral: 14.9 LV PW:          1.30 cm  LV e' medial:    4.03 cm/s LV IVS:         1.30 cm  LV E/e' medial:  23.7 LVOT diam:      1.80 cm LV SV:          67 ml LVOT Area:      2.54 cm  RIGHT VENTRICLE RV Basal diam:  2.60 cm RVSP:           28.4 mmHg  LEFT ATRIUM             Index       RIGHT ATRIUM           Index LA diam:        3.70 cm 1.99 cm/m  RA Pressure: 3 mmHg LA Vol (A2C):   63.8 ml 34.39 ml/m RA Area:     15.70 cm LA Vol (A4C):   70.7 ml 38.10 ml/m RA Volume:   41.10 ml  22.15 ml/m LA Biplane Vol: 67.7 ml 36.49 ml/m  AORTIC VALVE AV Area (Vmax):    0.86 cm AV Area (Vmean):   0.87 cm AV Area (VTI):     0.83 cm AV Vmax:           356.00 cm/s AV Vmean:          244.000 cm/s AV VTI:            0.953 m AV Peak Grad:      50.7 mmHg AV Mean Grad:      28.0 mmHg LVOT Vmax:         121.00 cm/s LVOT Vmean:        83.800 cm/s LVOT VTI:          0.312 m LVOT/AV VTI ratio: 0.33 AR PHT:            663 msec   AORTA Ao Root diam: 3.80 cm Ao Asc diam:  3.10 cm  MITRAL VALVE              TRICUSPID VALVE MV Area (PHT): 5.02 cm   TR Peak grad:   25.4 mmHg MV PHT:        43.79 msec TR Vmax:        252.00 cm/s MV  Decel Time: 151 msec   RVSP:           28.4 mmHg MV E velocity: 95.50 cm/s MV A velocity: 73.50 cm/s MV E/A ratio:  1.30      L/RHC 11/02/18 [facility]) - No angiographic evidence of significant disease.  RA mean 6 RV 45/10 PA 45/16 (26) PCWP 12 AO 90% Cardiac Output (Fick) 4.6 Cardiac Index (Fick) 2.4 SVR 1443  PVR 3 WU LVEDP 18 Mean AV gradient: 21.2 mmHg  Peak to peak gradient: 16 Valve area (gorlin): 1.1 cm2 (index 0.6)  Laboratory Data:  High Sensitivity  Troponin:   Recent Labs  Lab 06/27/19 2054 06/27/19 2201 06/28/19 0345 06/28/19 0816  TROPONINIHS 44* 40* 32* 32*     Cardiac EnzymesNo results for input(s): TROPONINI in the last 168 hours. No results for input(s): TROPIPOC in the last 168 hours.  Chemistry Recent Labs  Lab 06/27/19 1815 06/28/19 0213  NA 133* 133*  K 4.1 3.9  CL 103 104  CO2 21* 23  GLUCOSE 147* 138*  BUN 20 17  CREATININE 1.06* 0.82  CALCIUM 8.5* 8.3*  GFRNONAA 52* >60  GFRAA >60 >60  ANIONGAP 9 6    Recent Labs  Lab 06/28/19 0213  PROT 8.5*  ALBUMIN 2.6*  AST 26  ALT 12  ALKPHOS 49  BILITOT 0.9   Hematology Recent Labs  Lab 06/27/19 1815 06/28/19 0014 06/28/19 0213  WBC 3.9* 3.0* 2.5*  RBC 3.27* 3.11*   3.11* 3.06*  HGB 9.1* 8.7* 8.6*  HCT 30.1* 28.7* 27.9*  MCV 92.0 92.3 91.2  MCH 27.8 28.0 28.1  MCHC 30.2 30.3 30.8  RDW 18.2* 18.1* 18.1*  PLT 139* 125* 124*   BNPNo results for input(s): BNP, PROBNP in the last 168 hours.  DDimer No results for input(s): DDIMER in the last 168 hours.   Radiology/Studies:   Dg Chest 2 View Result Date: 06/27/2019 CLINICAL DATA:  Fall in her bedroom. EXAM: CHEST - 2 VIEW COMPARISON:  Radiograph 06/18/2011, chest CT 01/27/2017 FINDINGS: Post median sternotomy. Left-sided pacemaker in place. Mild cardiomegaly with unchanged mediastinal contours. No acute airspace disease, pleural effusion, or pneumothorax. No acute osseous abnormalities are seen. IMPRESSION: Mild cardiomegaly without acute abnormality. Electronically Signed   By: Keith Rake M.D.   On: 06/27/2019 22:03    Ct Head Wo Contrast Result Date: 06/27/2019 CLINICAL DATA:  Head trauma, headache, anticoagulation EXAM: CT HEAD WITHOUT CONTRAST TECHNIQUE: Contiguous axial images were obtained from the base of the skull through the vertex without intravenous contrast. COMPARISON:  02/09/2013 FINDINGS: Brain: No evidence of acute infarction, hemorrhage, hydrocephalus, extra-axial collection or  mass lesion/mass effect. Mild periventricular white matter hypodensity. Vascular: No hyperdense vessel or unexpected calcification. Skull: Normal. Negative for fracture or focal lesion. Sinuses/Orbits: No acute finding. Other: Soft tissue hematoma and laceration of the right forehead. IMPRESSION: 1.  No acute intracranial pathology. 2.  Soft tissue hematoma and laceration of the right forehead. Electronically Signed   By: Eddie Candle M.D.   On: 06/27/2019 20:00    Ct Cervical Spine Wo Contrast Result Date: 06/27/2019 CLINICAL DATA:  Fall, headache EXAM: CT CERVICAL SPINE WITHOUT CONTRAST TECHNIQUE: Multidetector CT imaging of the cervical spine was performed without intravenous contrast. Multiplanar CT image reconstructions were also generated. COMPARISON:  None. FINDINGS: Alignment: Normal cervical lordosis. Skull base and vertebrae: No acute fracture. No primary bone lesion or focal pathologic process. Soft tissues and spinal canal: No prevertebral fluid or swelling. No visible canal hematoma. Disc  levels: Mild degenerative changes of the mid/lower cervical spine. Spinal canal is patent. Upper chest: Visualized lung apices are clear. Other: Visualized thyroid is unremarkable. IMPRESSION: No evidence of traumatic injury to the cervical spine. Mild degenerative changes. Electronically Signed   By: Julian Hy M.D.   On: 06/27/2019 21:09    Assessment and Plan:   1. Syncope     [name] periorbital hematoma, no intracranial bleeding by CT 2. VT  10/2018, LHC w/no CAD Echos 11/2017, LVEF 55-60%, mod AS 10/2018 [facility]), LVEF 35%, mod-severe AS 12/2018 LVEF 50-55%, mod AS (45-50% with septal dyssynergy by Dr. Haroldine [name])  Evaristo Bury look OK  Dr. Lovena Le has seen and examined the patient Recommend PPM upgrade to ICD the patient is agreeable. She is on this afternoon's schedule  Further management with IM, attending cardiology team  3. Known AS 4. Known thoracic aortic dissection     Ongoing w/u in  process      Discussion of possible TAVR 5. Anemia     Not far from December labs  6. AFlutter (remotely)     Xarelto held here       For questions or updates, please contact [place] Please consult [email or web address] for contact info under   Signed, Baldwin Jamaica, PA-C  06/28/2019 9:59 AM   EP Attending  Patient seen and examined. Agree with the findings as noted above. She has had VT at home and syncope with trauma as noted above. I have reviewed the malignant nature of the VT and recommended we proceed with ICD insertion for secondary prevention. The risks/benefits/goals/expectations of the procedure were reviewed and she is willing to proceed. She will receive a DDD ICD.   Mikle Bosworth.D.

## 2019-06-28 NOTE — ED Notes (Signed)
Called cath lab stated to send patient to floor at this time.

## 2019-06-28 NOTE — ED Notes (Signed)
ED TO INPATIENT HANDOFF REPORT  ED Nurse Name and Phone #: Marya Amsler RN [phone]  S Name/Age/Gender [name] 73 y.o. female Room/Bed: 831D/176H  Code Status   Code Status: Full Code  Home/SNF/Other Home Patient oriented to: self, place, time and situation Is this baseline? Yes   Triage Complete: Triage complete  Chief Complaint Fall/Head Inj  Triage Note Pt arrives via ems after having a fall in her bedroom that doesnt exactly remember she knows she took a shower and was walking into her room and she woke up on the floor. Pt stayed on the floor until ems got there. Pt is on blood thinner and has a pacemake. Pt has headache and hematoma on right side of head above eye.    Allergies Allergies  Allergen Reactions  . Oxycodone Itching  . Vicodin [Hydrocodone-Acetaminophen] Itching  . Lidocaine-Aloe Vera Other (See Comments)    Felt like bees stinging her  . Latex Rash    Itching/rash   . Neomycin-Bacitracin Zn-Polymyx Other (See Comments)    Red , swollen eye  . Sulfonamide Derivatives Other (See Comments)    Just had reaction when taking during pregnancy- last child birth 42    Level of Care/Admitting Diagnosis ED Disposition    ED Disposition Condition [place]: [place] [100100]  Level of Care: Telemetry Cardiac [103]  I expect the patient will be discharged within 24 hours: No (not a candidate for 5C-Observation unit)  Covid Evaluation: Confirmed COVID Negative  Diagnosis: Syncope [206001]  Admitting Physician: Toy Baker [[id number]]  Attending Physician: Toy Baker [[id number]]  PT Class (Do Not Modify): Observation [104]  PT Acc Code (Do Not Modify): Observation [10022]       B Medical/Surgery History Past Medical History:  Diagnosis Date  . Acute thoracic aortic dissection [facility]) 2008   emergency surgery - [place]  . Anxiety   . Cancer ([facility])    skin  . Diabetes mellitus   . Diverticulosis   .  GERD (gastroesophageal reflux disease)   . Hemorrhoids   . Hyperlipidemia   . Hypertension   . Kidney stones   . Sinus node dysfunction (HCC)    hx of PPM   Past Surgical History:  Procedure Laterality Date  . ABDOMINAL HYSTERECTOMY  1984  . APPENDECTOMY    . EYE SURGERY  2014   Rght eye  . PACEMAKER GENERATOR CHANGE  2014   Medtronic adapta  . PACEMAKER GENERATOR CHANGE N/A 08/29/2013   Procedure: PACEMAKER GENERATOR CHANGE;  Surgeon: Sanda Klein, MD;  Location: [facility] CATH LAB;  Service: Cardiovascular;  Laterality: N/A;  . PACEMAKER INSERTION  08/2006   dual chamber Medtronic EnRhythm; r/t sinus node dysfunction   . REPAIR OF ACUTE ASCENDING THORACIC AORTIC DISSECTION  2008   Dr. Servando Snare  . THORACIC AORTIC ANEURYSM REPAIR  2000   type III  . TRANSTHORACIC ECHOCARDIOGRAM  09/20/2012   EF=>55% with mild conc LVH; LA mod dilated; RA mildly dilated; mild MR/TR/AR     A IV Location/Drains/Wounds Patient Lines/Drains/Airways Status   Active Line/Drains/Airways    Name:   Placement date:   Placement time:   Site:   Days:   Peripheral IV 06/27/19 Right Antecubital   06/27/19    -    Antecubital   1          Intake/Output Last 24 hours No intake or output data in the 24 hours ending 06/28/19 1240  Labs/Imaging Results for orders  placed or performed during the hospital encounter of 06/27/19 (from the past 48 hour(s))  Basic metabolic panel     Status: Abnormal   Collection Time: 06/27/19  6:15 PM  Result Value Ref Range   Sodium 133 (L) 135 - 145 mmol/L   Potassium 4.1 3.5 - 5.1 mmol/L   Chloride 103 98 - 111 mmol/L   CO2 21 (L) 22 - 32 mmol/L   Glucose, Bld 147 (H) 70 - 99 mg/dL   BUN 20 8 - 23 mg/dL   Creatinine, Ser 1.06 (H) 0.44 - 1.00 mg/dL   Calcium 8.5 (L) 8.9 - 10.3 mg/dL   GFR calc non Af Amer 52 (L) >60 mL/min   GFR calc Af Amer >60 >60 mL/min   Anion gap 9 5 - 15    Comment: Performed at [facility], [address] [place], [place] [address]  CBC      Status: Abnormal   Collection Time: 06/27/19  6:15 PM  Result Value Ref Range   WBC 3.9 (L) 4.0 - 10.5 K/uL   RBC 3.27 (L) 3.87 - 5.11 MIL/uL   Hemoglobin 9.1 (L) 12.0 - 15.0 g/dL   HCT 30.1 (L) 36.0 - 46.0 %   MCV 92.0 80.0 - 100.0 fL   MCH 27.8 26.0 - 34.0 pg   MCHC 30.2 30.0 - 36.0 g/dL   RDW 18.2 (H) 11.5 - 15.5 %   Platelets 139 (L) 150 - 400 K/uL   nRBC 0.0 0.0 - 0.2 %    Comment: Performed at [facility] [address] [place], [place] [address]  CBG monitoring, ED     Status: Abnormal   Collection Time: 06/27/19  8:13 PM  Result Value Ref Range   Glucose-Capillary 109 (H) 70 - 99 mg/dL  Urinalysis, Routine w reflex microscopic     Status: Abnormal   Collection Time: 06/27/19  8:20 PM  Result Value Ref Range   Color, Urine AMBER (A) YELLOW    Comment: BIOCHEMICALS MAY BE AFFECTED BY COLOR   APPearance HAZY (A) CLEAR   Specific Gravity, Urine 1.018 1.005 - 1.030   pH 5.0 5.0 - 8.0   Glucose, UA >=500 (A) NEGATIVE mg/dL   Hgb urine dipstick MODERATE (A) NEGATIVE   Bilirubin Urine NEGATIVE NEGATIVE   Ketones, ur NEGATIVE NEGATIVE mg/dL   Protein, ur 30 (A) NEGATIVE mg/dL   Nitrite NEGATIVE NEGATIVE   Leukocytes,Ua NEGATIVE NEGATIVE   RBC / HPF 21-50 0 - 5 RBC/hpf   WBC, UA 0-5 0 - 5 WBC/hpf   Bacteria, UA NONE SEEN NONE SEEN   Squamous Epithelial / LPF 0-5 0 - 5   Mucus PRESENT    Hyaline Casts, UA PRESENT     Comment: Performed at [facility], [address] [place], [place] [address]  Troponin I (High Sensitivity)     Status: Abnormal   Collection Time: 06/27/19  8:54 PM  Result Value Ref Range   Troponin I (High Sensitivity) 44 (H) <18 ng/L    Comment: (NOTE) Elevated high sensitivity troponin I (hsTnI) values and significant  changes across serial measurements may suggest ACS but many other  chronic and acute conditions are known to elevate hsTnI results.  Refer to the "Links" section for chest pain algorithms and additional   guidance. Performed at [facility], [place] [address], [place], [place] [address]   CK     Status: None   Collection Time: 06/27/19  8:54 PM  Result Value Ref Range   Total CK 39 38 - 234 U/L    Comment: Performed at [facility], [place] [address] [place], [place] [address]  SARS Coronavirus 2 (CEPHEID - Performed in [facility] lab), Hosp Order     Status: None   Collection Time: 06/27/19  9:14 PM   Specimen: Nasopharyngeal Swab  Result Value Ref Range   SARS Coronavirus 2 NEGATIVE NEGATIVE    Comment: (NOTE) If result is NEGATIVE SARS-CoV-2 target nucleic acids are NOT DETECTED. The SARS-CoV-2 RNA is generally detectable in upper and lower  respiratory specimens during the acute phase of infection. The lowest  concentration of SARS-CoV-2 viral copies this assay can detect is 250  copies / mL. A negative result does not preclude SARS-CoV-2 infection  and should not be used as the sole basis for treatment or other  patient management decisions.  A negative result may occur with  improper specimen collection / handling, submission of specimen other  than nasopharyngeal swab, presence of viral mutation(s) within the  areas targeted by this assay, and inadequate number of viral copies  (<250 copies / mL). A negative result must be combined with clinical  observations, patient history, and epidemiological information. If result is POSITIVE SARS-CoV-2 target nucleic acids are DETECTED. The SARS-CoV-2 RNA is generally detectable in upper and lower  respiratory specimens dur ing the acute phase of infection.  Positive  results are indicative of active infection with SARS-CoV-2.  Clinical  correlation with patient history and other diagnostic information is  necessary to determine patient infection status.  Positive results do  not rule out bacterial infection or co-infection with other viruses. If result is PRESUMPTIVE POSTIVE SARS-CoV-2 nucleic acids MAY BE  PRESENT.   A presumptive positive result was obtained on the submitted specimen  and confirmed on repeat testing.  While 2019 novel coronavirus  (SARS-CoV-2) nucleic acids may be present in the submitted sample  additional confirmatory testing may be necessary for epidemiological  and / or clinical management purposes  to differentiate between  SARS-CoV-2 and other Sarbecovirus currently known to infect humans.  If clinically indicated additional testing with an alternate test  methodology [phone]) is advised. The SARS-CoV-2 RNA is generally  detectable in upper and lower respiratory sp ecimens during the acute  phase of infection. The expected result is Negative. Fact Sheet for Patients:  StrictlyIdeas.no Fact Sheet for Healthcare Providers: BankingDealers.co.za This test is not yet approved or cleared by the [place] FDA and has been authorized for detection and/or diagnosis of SARS-CoV-2 by FDA under an Emergency Use Authorization (EUA).  This EUA will remain in effect (meaning this test can be used) for the duration of the COVID-19 declaration under Section 564(b)(1) of the Act, 21 U.S.C. section 360bbb-3(b)(1), unless the authorization is terminated or revoked sooner. Performed at [facility], [place] [address] [place], [place] [address]   Troponin I (High Sensitivity)     Status: Abnormal   Collection Time: 06/27/19 10:01 PM  Result Value Ref Range   Troponin I (High Sensitivity) 40 (H) <18 ng/L    Comment: (NOTE) Elevated high sensitivity troponin I (hsTnI) values and significant  changes across serial measurements may suggest ACS but many other  chronic and acute conditions are known to elevate hsTnI results.  Refer to the "Links" section for chest pain algorithms and additional  guidance. Performed at [facility], [place] [address] [place], [place] [address]  POC occult blood, ED     Status: None    Collection Time: 06/27/19 11:21 PM  Result Value Ref Range   Fecal Occult Bld NEGATIVE NEGATIVE  Vitamin B12     Status: None   Collection Time: 06/28/19 12:14 AM  Result Value Ref Range   Vitamin B-12 243 180 - 914 pg/mL    Comment: (NOTE) This assay is not validated for testing neonatal or myeloproliferative syndrome specimens for Vitamin B12 levels. Performed at [facility], [place] [address] [place], [place] [address]   Folate     Status: None   Collection Time: 06/28/19 12:14 AM  Result Value Ref Range   Folate 11.5 >5.9 ng/mL    Comment: Performed at [facility] [address] [place], [place] [address]  Iron and TIBC     Status: None   Collection Time: 06/28/19 12:14 AM  Result Value Ref Range   Iron 35 28 - 170 ug/dL   TIBC 307 250 - 450 ug/dL   Saturation Ratios 11 10.4 - 31.8 %   UIBC 272 ug/dL    Comment: Performed at [facility], [place] [address], [place], [place] [address]  Ferritin     Status: None   Collection Time: 06/28/19 12:14 AM  Result Value Ref Range   Ferritin 301 11 - 307 ng/mL    Comment: Performed at [facility], [place] [address] [place], [place] [address]  Reticulocytes     Status: Abnormal   Collection Time: 06/28/19 12:14 AM  Result Value Ref Range   Retic Ct Pct 3.9 (H) 0.4 - 3.1 %   RBC. 3.11 (L) 3.87 - 5.11 MIL/uL   Retic Count, Absolute 120.7 19.0 - 186.0 K/uL   Immature Retic Fract 20.8 (H) 2.3 - 15.9 %    Comment: Performed at [facility] [address] [place], [place] [address]  CBC with Differential/Platelet     Status: Abnormal   Collection Time: 06/28/19 12:14 AM  Result Value Ref Range   WBC 3.0 (L) 4.0 - 10.5 K/uL   RBC 3.11 (L) 3.87 - 5.11 MIL/uL   Hemoglobin 8.7 (L) 12.0 - 15.0 g/dL   HCT 28.7 (L) 36.0 - 46.0 %   MCV 92.3 80.0 - 100.0 fL   MCH 28.0 26.0 - 34.0 pg   MCHC 30.3 30.0 - 36.0 g/dL   RDW 18.1 (H) 11.5 - 15.5 %   Platelets 125 (L) 150 - 400 K/uL   nRBC 0.0 0.0 - 0.2 %    Neutrophils Relative % 55 %   Neutro Abs 1.7 1.7 - 7.7 K/uL   Lymphocytes Relative 37 %   Lymphs Abs 1.1 0.7 - 4.0 K/uL   Monocytes Relative 6 %   Monocytes Absolute 0.2 0.1 - 1.0 K/uL   Eosinophils Relative 1 %   Eosinophils Absolute 0.0 0.0 - 0.5 K/uL   Basophils Relative 0 %   Basophils Absolute 0.0 0.0 - 0.1 K/uL   Immature Granulocytes 1 %   Abs Immature Granulocytes 0.02 0.00 - 0.07 K/uL   Polychromasia PRESENT     Comment: Performed at [facility], [place] [address], [place], [place] [address]  CBG monitoring, ED     Status: Abnormal   Collection Time: 06/28/19 12:45 AM  Result Value Ref Range   Glucose-Capillary 111 (H) 70 - 99 mg/dL  Hemoglobin A1c     Status: None   Collection Time: 06/28/19  2:13  AM  Result Value Ref Range   Hgb A1c MFr Bld 5.5 4.8 - 5.6 %    Comment: (NOTE) Pre diabetes:          5.7%-6.4% Diabetes:              >6.4% Glycemic control for   <7.0% adults with diabetes    Mean Plasma Glucose 111.15 mg/dL    Comment: Performed at [facility] [address], [place], [place] [address]  Magnesium     Status: None   Collection Time: 06/28/19  2:13 AM  Result Value Ref Range   Magnesium 2.0 1.7 - 2.4 mg/dL    Comment: Performed at [facility] [address] [place], [place] [address]  Phosphorus     Status: None   Collection Time: 06/28/19  2:13 AM  Result Value Ref Range   Phosphorus 3.0 2.5 - 4.6 mg/dL    Comment: Performed at [facility] [address], [place], [place] [address]  TSH     Status: None   Collection Time: 06/28/19  2:13 AM  Result Value Ref Range   TSH 0.893 0.350 - 4.500 uIU/mL    Comment: Performed by a 3rd Generation assay with a functional sensitivity of <=0.01 uIU/mL. Performed at [facility], [place] [address] [place], [place] [address]   Comprehensive metabolic panel     Status: Abnormal   Collection Time: 06/28/19  2:13 AM  Result Value Ref Range   Sodium 133 (L) 135 - 145 mmol/L    Potassium 3.9 3.5 - 5.1 mmol/L   Chloride 104 98 - 111 mmol/L   CO2 23 22 - 32 mmol/L   Glucose, Bld 138 (H) 70 - 99 mg/dL   BUN 17 8 - 23 mg/dL   Creatinine, Ser 0.82 0.44 - 1.00 mg/dL   Calcium 8.3 (L) 8.9 - 10.3 mg/dL   Total Protein 8.5 (H) 6.5 - 8.1 g/dL   Albumin 2.6 (L) 3.5 - 5.0 g/dL   AST 26 15 - 41 U/L   ALT 12 0 - 44 U/L   Alkaline Phosphatase 49 38 - 126 U/L   Total Bilirubin 0.9 0.3 - 1.2 mg/dL   GFR calc non Af Amer >60 >60 mL/min   GFR calc Af Amer >60 >60 mL/min   Anion gap 6 5 - 15    Comment: Performed at [facility], [place] [address] [place], [place] [address]  CBC     Status: Abnormal   Collection Time: 06/28/19  2:13 AM  Result Value Ref Range   WBC 2.5 (L) 4.0 - 10.5 K/uL   RBC 3.06 (L) 3.87 - 5.11 MIL/uL   Hemoglobin 8.6 (L) 12.0 - 15.0 g/dL   HCT 27.9 (L) 36.0 - 46.0 %   MCV 91.2 80.0 - 100.0 fL   MCH 28.1 26.0 - 34.0 pg   MCHC 30.8 30.0 - 36.0 g/dL   RDW 18.1 (H) 11.5 - 15.5 %   Platelets 124 (L) 150 - 400 K/uL   nRBC 0.0 0.0 - 0.2 %    Comment: Performed at [facility] [address], [place], [place] [address]  Troponin I (High Sensitivity)     Status: Abnormal   Collection Time: 06/28/19  3:45 AM  Result Value Ref Range   Troponin I (High Sensitivity) 32 (H) <18 ng/L    Comment: (NOTE) Elevated high sensitivity troponin I (hsTnI) values and significant  changes across  serial measurements may suggest ACS but many other  chronic and acute conditions are known to elevate hsTnI results.  Refer to the "Links" section for chest pain algorithms and additional  guidance. Performed at [facility], [place] [address] [place], [place] [address]   CBG monitoring, ED     Status: None   Collection Time: 06/28/19  7:35 AM  Result Value Ref Range   Glucose-Capillary 99 70 - 99 mg/dL  Troponin I (High Sensitivity)     Status: Abnormal   Collection Time: 06/28/19  8:16 AM  Result Value Ref Range   Troponin I (High Sensitivity) 32 (H) <18  ng/L    Comment: (NOTE) Elevated high sensitivity troponin I (hsTnI) values and significant  changes across serial measurements may suggest ACS but many other  chronic and acute conditions are known to elevate hsTnI results.  Refer to the "Links" section for chest pain algorithms and additional  guidance. Performed at [facility], [place] [address] [place], [place] [address]    Dg Chest 2 View  Result Date: 06/27/2019 CLINICAL DATA:  Fall in her bedroom. EXAM: CHEST - 2 VIEW COMPARISON:  Radiograph 06/18/2011, chest CT 01/27/2017 FINDINGS: Post median sternotomy. Left-sided pacemaker in place. Mild cardiomegaly with unchanged mediastinal contours. No acute airspace disease, pleural effusion, or pneumothorax. No acute osseous abnormalities are seen. IMPRESSION: Mild cardiomegaly without acute abnormality. Electronically Signed   By: Keith Rake M.D.   On: 06/27/2019 22:03   Ct Head Wo Contrast  Result Date: 06/27/2019 CLINICAL DATA:  Head trauma, headache, anticoagulation EXAM: CT HEAD WITHOUT CONTRAST TECHNIQUE: Contiguous axial images were obtained from the base of the skull through the vertex without intravenous contrast. COMPARISON:  02/09/2013 FINDINGS: Brain: No evidence of acute infarction, hemorrhage, hydrocephalus, extra-axial collection or mass lesion/mass effect. Mild periventricular white matter hypodensity. Vascular: No hyperdense vessel or unexpected calcification. Skull: Normal. Negative for fracture or focal lesion. Sinuses/Orbits: No acute finding. Other: Soft tissue hematoma and laceration of the right forehead. IMPRESSION: 1.  No acute intracranial pathology. 2.  Soft tissue hematoma and laceration of the right forehead. Electronically Signed   By: Eddie Candle M.D.   On: 06/27/2019 20:00   Ct Cervical Spine Wo Contrast  Result Date: 06/27/2019 CLINICAL DATA:  Fall, headache EXAM: CT CERVICAL SPINE WITHOUT CONTRAST TECHNIQUE: Multidetector CT imaging of the cervical  spine was performed without intravenous contrast. Multiplanar CT image reconstructions were also generated. COMPARISON:  None. FINDINGS: Alignment: Normal cervical lordosis. Skull base and vertebrae: No acute fracture. No primary bone lesion or focal pathologic process. Soft tissues and spinal canal: No prevertebral fluid or swelling. No visible canal hematoma. Disc levels: Mild degenerative changes of the mid/lower cervical spine. Spinal canal is patent. Upper chest: Visualized lung apices are clear. Other: Visualized thyroid is unremarkable. IMPRESSION: No evidence of traumatic injury to the cervical spine. Mild degenerative changes. Electronically Signed   By: Julian Hy M.D.   On: 06/27/2019 21:09    Pending Labs Unresulted Labs (From admission, onward)    Start     Ordered   06/28/19 1142  Surgical PCR screen  (Screening)  Once,   STAT     06/28/19 1141   06/28/19 1051  Vitamin B12  (Anemia Panel (PNL))  Once,   STAT     06/28/19 1051   06/28/19 1051  Folate  (Anemia Panel (PNL))  Once,   STAT     06/28/19 1051   06/28/19 1051  Iron  and TIBC  (Anemia Panel (PNL))  Once,   STAT     06/28/19 1051   06/28/19 1051  Ferritin  (Anemia Panel (PNL))  Once,   STAT     06/28/19 1051   06/27/19 2250  Differential  Once,   STAT     06/27/19 2249   06/27/19 1943  Urine culture  ONCE - STAT,   STAT     06/27/19 1942          Vitals/Pain Today's Vitals   06/28/19 0825 06/28/19 0830 06/28/19 0900 06/28/19 0930  BP:  116/66 109/79 (!) 110/45  Pulse: (!) 55   (!) 55  Resp:    18  Temp:      TempSrc:      SpO2: 97%   96%  PainSc:        Isolation Precautions No active isolations  Medications Medications  sodium chloride flush (NS) 0.9 % injection 3 mL (has no administration in time range)  citalopram (CELEXA) tablet 20 mg (20 mg Oral Given 06/28/19 1025)  insulin aspart (novoLOG) injection 0-5 Units (0 Units Subcutaneous Not Given 06/28/19 0108)  insulin aspart (novoLOG) injection  0-9 Units (0 Units Subcutaneous Not Given 06/28/19 0740)  carvedilol (COREG) tablet 25 mg (25 mg Oral Given 06/28/19 0829)  fenofibrate tablet 160 mg (160 mg Oral Not Given 06/28/19 0056)  furosemide (LASIX) tablet 20 mg (20 mg Oral Given 06/28/19 1025)  losartan (COZAAR) tablet 25 mg (25 mg Oral Given 06/28/19 1025)  rosuvastatin (CRESTOR) tablet 20 mg (20 mg Oral Not Given 06/28/19 0056)  ALPRAZolam (XANAX) tablet 0.5 mg (0.5 mg Oral Given 06/28/19 1026)  citalopram (CELEXA) tablet 60 mg (60 mg Oral Not Given 06/28/19 1024)  acetaminophen (TYLENOL) tablet 650 mg (has no administration in time range)    Or  acetaminophen (TYLENOL) suppository 650 mg (has no administration in time range)  ondansetron (ZOFRAN) tablet 4 mg (has no administration in time range)    Or  ondansetron (ZOFRAN) injection 4 mg (has no administration in time range)  0.9 %  sodium chloride infusion (has no administration in time range)  gentamicin (GARAMYCIN) 80 mg in sodium chloride 0.9 % 500 mL irrigation (has no administration in time range)  chlorhexidine (HIBICLENS) 4 % liquid 4 application (has no administration in time range)  chlorhexidine (HIBICLENS) 4 % liquid 4 application (has no administration in time range)  sodium chloride flush (NS) 0.9 % injection 3 mL (has no administration in time range)  sodium chloride flush (NS) 0.9 % injection 3 mL (has no administration in time range)  0.9 %  sodium chloride infusion (has no administration in time range)  ceFAZolin (ANCEF) IVPB 2g/100 mL premix (has no administration in time range)  meclizine (ANTIVERT) tablet 25 mg (25 mg Oral Given 06/27/19 2056)  sodium chloride 0.9 % bolus 500 mL (0 mLs Intravenous Stopped 06/27/19 2148)  ALPRAZolam (XANAX) tablet 1 mg (1 mg Oral Given 06/27/19 2112)  traMADol (ULTRAM) tablet 50 mg (50 mg Oral Given 06/28/19 0401)    Mobility walks Low fall risk   Focused Assessments    R Recommendations: See Admitting Provider Note  Report  given to:   Additional Notes:

## 2019-06-28 NOTE — Progress Notes (Signed)
  Echocardiogram 2D Echocardiogram has been performed.  [name] 06/28/2019, 1:49 PM

## 2019-06-29 ENCOUNTER — Other Ambulatory Visit: Payer: Self-pay

## 2019-06-29 ENCOUNTER — Inpatient Hospital Stay (HOSPITAL_COMMUNITY): Payer: Medicare HMO

## 2019-06-29 ENCOUNTER — Encounter (HOSPITAL_COMMUNITY): Payer: Self-pay | Admitting: Internal Medicine

## 2019-06-29 DIAGNOSIS — I350 Nonrheumatic aortic (valve) stenosis: Secondary | ICD-10-CM

## 2019-06-29 DIAGNOSIS — E1151 Type 2 diabetes mellitus with diabetic peripheral angiopathy without gangrene: Secondary | ICD-10-CM

## 2019-06-29 DIAGNOSIS — D649 Anemia, unspecified: Secondary | ICD-10-CM

## 2019-06-29 DIAGNOSIS — Z6835 Body mass index (BMI) 35.0-35.9, adult: Secondary | ICD-10-CM

## 2019-06-29 DIAGNOSIS — E785 Hyperlipidemia, unspecified: Secondary | ICD-10-CM

## 2019-06-29 DIAGNOSIS — I10 Essential (primary) hypertension: Secondary | ICD-10-CM

## 2019-06-29 DIAGNOSIS — E1165 Type 2 diabetes mellitus with hyperglycemia: Secondary | ICD-10-CM

## 2019-06-29 DIAGNOSIS — I7101 Dissection of thoracic aorta: Secondary | ICD-10-CM

## 2019-06-29 LAB — CBC
HCT: 30 % — ABNORMAL LOW (ref 36.0–46.0)
Hemoglobin: 9.3 g/dL — ABNORMAL LOW (ref 12.0–15.0)
MCH: 28.4 pg (ref 26.0–34.0)
MCHC: 31 g/dL (ref 30.0–36.0)
MCV: 91.7 fL (ref 80.0–100.0)
Platelets: 120 10*3/uL — ABNORMAL LOW (ref 150–400)
RBC: 3.27 MIL/uL — ABNORMAL LOW (ref 3.87–5.11)
RDW: 18.2 % — ABNORMAL HIGH (ref 11.5–15.5)
WBC: 3.3 10*3/uL — ABNORMAL LOW (ref 4.0–10.5)
nRBC: 0 % (ref 0.0–0.2)

## 2019-06-29 LAB — COMPREHENSIVE METABOLIC PANEL
ALT: 11 U/L (ref 0–44)
AST: 26 U/L (ref 15–41)
Albumin: 2.6 g/dL — ABNORMAL LOW (ref 3.5–5.0)
Alkaline Phosphatase: 49 U/L (ref 38–126)
Anion gap: 5 (ref 5–15)
BUN: 18 mg/dL (ref 8–23)
CO2: 25 mmol/L (ref 22–32)
Calcium: 8.5 mg/dL — ABNORMAL LOW (ref 8.9–10.3)
Chloride: 103 mmol/L (ref 98–111)
Creatinine, Ser: 1.08 mg/dL — ABNORMAL HIGH (ref 0.44–1.00)
GFR calc Af Amer: 59 mL/min — ABNORMAL LOW (ref >60)
GFR calc non Af Amer: 51 mL/min — ABNORMAL LOW (ref >60)
Glucose, Bld: 114 mg/dL — ABNORMAL HIGH (ref 70–99)
Potassium: 4.5 mmol/L (ref 3.5–5.1)
Sodium: 133 mmol/L — ABNORMAL LOW (ref 135–145)
Total Bilirubin: 0.6 mg/dL (ref 0.3–1.2)
Total Protein: 8.8 g/dL — ABNORMAL HIGH (ref 6.5–8.1)

## 2019-06-29 LAB — GLUCOSE, CAPILLARY
Glucose-Capillary: 123 mg/dL — ABNORMAL HIGH (ref 70–99)
Glucose-Capillary: 96 mg/dL (ref 70–99)
Glucose-Capillary: 107 mg/dL — ABNORMAL HIGH (ref 70–99)

## 2019-06-29 MED ORDER — FUROSEMIDE 20 MG PO TABS: 20.0000 mg | ORAL_TABLET | Freq: Every day | ORAL | 0 refills | Status: DC

## 2019-06-29 MED FILL — Lidocaine HCl Local Inj 1%: INTRAMUSCULAR | Qty: 40 | Status: AC

## 2019-06-29 NOTE — Discharge Summary (Signed)
Physician Discharge Summary  [name] EXH:[id number] DOB: 1945-12-08 DOA: 06/27/2019  PCP: [name], DO  Admit date: 06/27/2019 Discharge date: 06/29/2019  Admitted From: Home Disposition:  Home  Recommendations for Outpatient Follow-up:  1. Follow up with PCP in 1-2 weeks 2. Please obtain BMP/CBC in one week  Home Health: None  Equipment/Devices: None  Discharge Condition: Stable  CODE STATUS: Full  Diet recommendation: As tolerated   Brief/Interim Summary: [name] is a 73 y.o. female with medical history significant of diabetes, hypertension, hyperlipidemia, thoracic aortic aneurysm repair x2,  SINUS NODAL DYSFUNCTION pacemaker, moderate aortic stenosis. Presented with   could be that occurred today when she got out of shower she woke up being under her bed when she woke up she had a headache and confusion. Was no prodrome she was not feeling lightheaded prior to falling down she is feeling overall weak and lightheaded no chest pain no numbness she is chronically on Xarelto. Denies melena no black stools. In ED Cardiology consulted and requesting medical admission.  Patient is now status post ICD placement, pacemaker removed.  Cardio agreeable for discharge home given tolerated procedure.  Patient to follow with cardiology in the next 2 to 3 weeks scheduled, follow-up with PCP as scheduled.  Limited exertion, heavy lifting, driving per cardiology.  Cardiology indicates no notable A. fib recorded over the past 3 years, recommend stopping anticoagulation, also recommend decrease dose of furosemide and discontinuation of fenofibrate.  Patient otherwise stable and agreeable for discharge home.  Patient's chronic comorbid conditions as below otherwise stable.  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, status post graft repair the ascending aorta   GERD   Pacemaker - Medtronic Adapta dual chamber Sep 2014, initial implantation 2007   Paroxysmal atrial flutter ([place])   DM (diabetes mellitus) type II uncontrolled, periph vascular disorder (HCC)   Aortic valve stenosis, nonrheumatic   Severe obesity (BMI 35.0-35.9 with comorbidity) (HCC)   Anemia   Elevated troponin  Discharge Instructions Discharge Instructions    Diet - low sodium heart healthy   Complete by: As directed    Increase activity slowly   Complete by: As directed      Allergies as of 06/29/2019      Reactions   Oxycodone Itching   Vicodin [hydrocodone-acetaminophen] Itching   Lidocaine-aloe Vera Other (See Comments)   Felt like bees stinging her   Latex Rash   Itching/rash    Neomycin-bacitracin Zn-polymyx Other (See Comments)   Red , swollen eye   Sulfonamide Derivatives Other (See Comments)   Just had reaction when taking during pregnancy- last child birth 54      Medication List    STOP taking these medications   fenofibrate 160 MG tablet   sitaGLIPtin 100 MG tablet Commonly known as: Januvia   Xarelto 20 MG Tabs tablet Generic drug: rivaroxaban     TAKE these medications   ALPRAZolam 0.5 MG tablet Commonly known as: XANAX Take 1 tablet (0.5 mg total) by mouth 3 (three) times daily as needed for anxiety.   carvedilol 25 MG tablet Commonly known as: COREG TAKE 1 TABLET BY MOUTH TWICE A DAY WITH A MEAL What changed: See the new instructions.   cholecalciferol 1000 units tablet Commonly known as: VITAMIN D Take 1,000 Units by  mouth daily.   citalopram 20 MG tablet Commonly known as: CELEXA TAKE 3 TABLETS BY MOUTH EVERY DAY What changed:   how much to take  how to take this  when to take this  additional instructions   empagliflozin 10 MG Tabs tablet Commonly known as: JARDIANCE Take 10 mg by mouth daily.   Fish Oil 1000 MG Caps Take 1 capsule by mouth daily.    fluticasone 50 MCG/ACT nasal spray Commonly known as: FLONASE USE 2 SPRAYS IN EACH NOSTIRL EVERY DAY What changed: See the new instructions.   furosemide 20 MG tablet Commonly known as: LASIX Take 1 tablet (20 mg total) by mouth daily. Start taking on: June 30, 2019 What changed:   medication strength  how much to take   hyoscyamine 0.125 MG SL tablet Commonly known as: LEVSIN SL TAKE 1 TABLET BY MOUTH TWICE A DAY AS NEEDED FOR CRAMPING OR DIARRHEA OR LOOSE STOOLS What changed: See the new instructions.   losartan 25 MG tablet Commonly known as: COZAAR Take 1 tablet (25 mg total) by mouth daily.   PRO-BIOTIC BLEND PO Take 1 tablet by mouth daily.   rosuvastatin 20 MG tablet Commonly known as: CRESTOR TAKE 1 TABLET BY MOUTH EVERY DAY What changed: when to take this   SYSTANE OP Apply 1 drop to eye 4 (four) times daily - after meals and at bedtime.      Follow-up Information    [facility].   Specialty: Cardiology Why: 07/12/2019 @ 12:00PM (noon), wound check visit Contact information: [address] [place]       Evans Lance, MD Follow up.   Specialty: Cardiology Why: 09/28/2019 @ 2:15PM Contact information: [address] [place] [address] [phone]          Allergies  Allergen Reactions  . Oxycodone Itching  . Vicodin [Hydrocodone-Acetaminophen] Itching  . Lidocaine-Aloe Vera Other (See Comments)    Felt like bees stinging her  . Latex Rash    Itching/rash   . Neomycin-Bacitracin Zn-Polymyx Other (See Comments)    Red , swollen eye  . Sulfonamide Derivatives Other (See Comments)    Just had reaction when taking during pregnancy- last child birth 1972    Consultations:  Cardiology   Procedures/Studies: Dg Chest 2 View  Result Date: 06/29/2019 CLINICAL DATA:  Status post defibrillator placement. EXAM: CHEST - 2 VIEW  COMPARISON:  Chest radiograph 06/27/2019 FINDINGS: Multi lead AICD device overlies the left hemithorax. Lateral view limited due to overlapping soft tissue. Stable cardiomegaly. Low lung volumes. Bibasilar atelectasis. No pleural effusion or pneumothorax. Thoracic spine degenerative changes. IMPRESSION: Cardiomegaly. Low lung volumes.  Bibasilar atelectasis. Electronically Signed   By: Lovey Newcomer M.D.   On: 06/29/2019 08:53   Dg Chest 2 View  Result Date: 06/27/2019 CLINICAL DATA:  Fall in her bedroom. EXAM: CHEST - 2 VIEW COMPARISON:  Radiograph 06/18/2011, chest CT 01/27/2017 FINDINGS: Post median sternotomy. Left-sided pacemaker in place. Mild cardiomegaly with unchanged mediastinal contours. No acute airspace disease, pleural effusion, or pneumothorax. No acute osseous abnormalities are seen. IMPRESSION: Mild cardiomegaly without acute abnormality. Electronically Signed   By: Keith Rake M.D.   On: 06/27/2019 22:03   Ct Head Wo Contrast  Result Date: 06/27/2019 CLINICAL DATA:  Head trauma, headache, anticoagulation EXAM: CT HEAD WITHOUT CONTRAST TECHNIQUE: Contiguous axial images were obtained from the base of the skull through the vertex without intravenous  contrast. COMPARISON:  02/09/2013 FINDINGS: Brain: No evidence of acute infarction, hemorrhage, hydrocephalus, extra-axial collection or mass lesion/mass effect. Mild periventricular white matter hypodensity. Vascular: No hyperdense vessel or unexpected calcification. Skull: Normal. Negative for fracture or focal lesion. Sinuses/Orbits: No acute finding. Other: Soft tissue hematoma and laceration of the right forehead. IMPRESSION: 1.  No acute intracranial pathology. 2.  Soft tissue hematoma and laceration of the right forehead. Electronically Signed   By: Eddie Candle M.D.   On: 06/27/2019 20:00   Ct Cervical Spine Wo Contrast  Result Date: 06/27/2019 CLINICAL DATA:  Fall, headache EXAM: CT CERVICAL SPINE WITHOUT CONTRAST TECHNIQUE:  Multidetector CT imaging of the cervical spine was performed without intravenous contrast. Multiplanar CT image reconstructions were also generated. COMPARISON:  None. FINDINGS: Alignment: Normal cervical lordosis. Skull base and vertebrae: No acute fracture. No primary bone lesion or focal pathologic process. Soft tissues and spinal canal: No prevertebral fluid or swelling. No visible canal hematoma. Disc levels: Mild degenerative changes of the mid/lower cervical spine. Spinal canal is patent. Upper chest: Visualized lung apices are clear. Other: Visualized thyroid is unremarkable. IMPRESSION: No evidence of traumatic injury to the cervical spine. Mild degenerative changes. Electronically Signed   By: Julian Hy M.D.   On: 06/27/2019 21:09   Ct Coronary Morph W/cta Cor W/score W/ca W/cm &/or Wo/cm  Result Date: 06/28/2019 EXAM: OVER-READ INTERPRETATION  CT CHEST The following report is an over-read performed by radiologist Dr. Vinnie Langton of [facility], [place] on 06/28/2019. This over-read does not include interpretation of cardiac or coronary anatomy or pathology. The coronary calcium score/coronary CTA interpretation by the cardiologist is attached. COMPARISON:  CTA chest 01/27/2017. FINDINGS: Extracardiac findings will be described separately under dictation for contemporaneously obtained CTA chest, abdomen and pelvis. IMPRESSION: Please see separate dictation for contemporaneously obtained CTA chest, abdomen and pelvis dated 06/28/2019 for full description of relevant extracardiac findings. Electronically Signed   By: Vinnie Langton M.D.   On: 06/28/2019 14:41   Ct Angio Chest/abd/pel For Dissection W And/or W/wo  Result Date: 06/28/2019 CLINICAL DATA:  73 year old female with history of aortic valve replacement with known thoracic aortic dissection. EXAM: CT ANGIOGRAPHY CHEST, ABDOMEN AND PELVIS TECHNIQUE: Multidetector CT imaging through the chest, abdomen and pelvis was performed  using the standard protocol during bolus administration of intravenous contrast. Multiplanar reconstructed images and MIPs were obtained and reviewed to evaluate the vascular anatomy. CONTRAST:  62mL OMNIPAQUE IOHEXOL 350 MG/ML SOLN COMPARISON:  Chest CT 05/28/2019. CTA chest, abdomen and pelvis 02/09/2018. FINDINGS: CTA CHEST FINDINGS Cardiovascular: Heart size is enlarged with left ventricular dilatation. There is no significant pericardial fluid, thickening or pericardial calcification. There is aortic atherosclerosis, as well as atherosclerosis of the great vessels of the mediastinum and the coronary arteries, including calcified atherosclerotic plaque in the left anterior descending and right coronary arteries. Thickening calcification of the aortic valve. Status post median sternotomy for graft replacement of the ascending thoracic aorta. Chronic aortic dissection flap originating immediately distal to the graft involving the aortic arch and the descending thoracic aorta. The dissection flap does not extend into the proximal great vessels. Both the true and false lumen are patent. Ectasia of descending thoracic aorta (3.5 x 2.8 cm). Dissection flap extends into the pelvis (discussed below). Mediastinum/Lymph Nodes: Multiple prominent borderline enlarged mediastinal lymph nodes measuring up to 11 mm in short axis in the low right paratracheal nodal station, nonspecific. Esophagus is unremarkable in appearance. No axillary lymphadenopathy. Lungs/Pleura: No acute consolidative airspace disease.  No suspicious. No pleural effusions appearing pulmonary nodules or masses. Musculoskeletal/Soft Tissues: Median sternotomy wires. There are no aggressive appearing lytic or blastic lesions noted in the visualized portions of the skeleton. CTA ABDOMEN AND PELVIS FINDINGS Hepatobiliary: No suspicious cystic or solid hepatic lesions. No intra or extrahepatic biliary ductal dilatation. Gallbladder is normal in appearance.  Pancreas: No pancreatic mass. No pancreatic ductal dilatation. No pancreatic or peripancreatic fluid collections or inflammatory changes. Spleen: Unremarkable. Adrenals/Urinary Tract: Nonobstructive calculi within the left renal collecting system, largest of which is in the lower pole measuring 6 mm. Multiple low-attenuation lesions in the kidneys bilaterally, measuring up to 3.9 cm in the interpolar region of the right kidney, compatible with simple cysts. Other subcentimeter low-attenuation lesions in the kidneys bilaterally, too small to characterize, but statistically likely to represent tiny cysts. No hydroureteronephrosis. Urinary bladder is normal in appearance. Stomach/Bowel: Normal appearance of the stomach. No pathologic dilatation of small bowel or colon. Numerous colonic diverticulae are noted, particularly in the sigmoid colon, without surrounding inflammatory changes to suggest an acute diverticulitis at this time. The appendix is not confidently identified and may be surgically absent. Regardless, there are no inflammatory changes noted adjacent to the cecum to suggest the presence of an acute appendicitis at this time. Vascular/Lymphatic: Aortic atherosclerosis with diffuse ectasia of the abdominal aorta measuring up to 2.8 x 2.2 cm with dissection flap extending into the distal left common iliac artery. The true lumen feeds the celiac axis, superior mesenteric artery, inferior mesenteric artery and right renal artery, all of which are widely patent at this time. The false lumen feeds the left renal artery which is widely patent. Ectasia of left common iliac artery (which contains a dissection flap) which measures up to 1.7 x 1.1 cm. Vascular findings and measurements pertinent to potential TAVR procedure, as detailed below. No pathologically lymphadenopathy noted in the abdomen or pelvis. Reproductive: Status post hysterectomy. Ovaries are not confidently identified may be surgically absent or  atrophic. Other: No significant volume of ascites.  No pneumoperitoneum. Musculoskeletal: There are no aggressive appearing lytic or blastic lesions noted in the visualized portions of the skeleton. VASCULAR MEASUREMENTS PERTINENT TO TAVR: AORTA: Minimal Aortic Diameter-1.4 x 1.2 cm mm (minimal diameter of the true lumen in the region of the infrarenal abdominal aorta) Severity of Aortic Calcification-moderate RIGHT PELVIS: Right Common Iliac Artery - Minimal Diameter-10.6 x 10.3 mm Tortuosity-mild Calcification-minimal Right External Iliac Artery - Minimal Diameter-6.7 x 6.7 mm Tortuosity-moderate Calcification-none Right Common Femoral Artery - Minimal Diameter-6.5 x 6.6 mm Tortuosity-mild Calcification-none LEFT PELVIS: Left Common Iliac Artery - Minimal Diameter-11.0 x 8.6 mm (true lumen minimal diameter) Tortuosity-mild Calcification-minimal Left External Iliac Artery - Minimal Diameter-6.6 x 6.7 mm Tortuosity-moderate Calcification-none Left Common Femoral Artery - Minimal Diameter-6.3 x 6.0 mm Tortuosity-mild Calcification-none Review of the MIP images confirms the above findings. IMPRESSION: 1. Vascular findings and measurements pertinent to potential TAVR procedure, as detailed above. 2. Thickening calcification of the aortic valve, compatible with the reported clinical history of severe aortic stenosis. 3. Chronic thoracoabdominal aortic dissection status post graft repair in the ascending thoracic aorta, which appears similar to the prior examination, as detailed above. This dissection flap does not extend into the great vessels, but does extend into the distal aspect of the left common iliac artery. 4. Cardiomegaly with left ventricular dilatation. 5. Nonobstructive calculi within the left renal collecting system measuring up to 6 mm in the lower pole. 6. Colonic diverticulosis without evidence of acute diverticulitis at this time.  7. Additional incidental findings, as above. Electronically Signed   By:  Vinnie Langton M.D.   On: 06/28/2019 15:35    Subjective: No issues/events overnight - tolerated procedure well.  Pain well controlled, no issues at surgical site.  Denies chest pain, shortness of breath, nausea, vomiting, diarrhea, constipation, headache, fevers, chills.   Discharge Exam: Vitals:   06/29/19 0131 06/29/19 0545  BP: 97/64 (!) 146/47  Pulse: 88 (!) 52  Resp: 20 20  Temp:  98.5 F (36.9 C)  SpO2: 92% 99%   Vitals:   06/28/19 2054 06/29/19 0119 06/29/19 0131 06/29/19 0545  BP: (!) 135/49 (!) 107/44 97/64 (!) 146/47  Pulse: (!) 50 (!) 48 88 (!) 52  Resp: 20 20 20 20   Temp: 98.4 F (36.9 C) 97.6 F (36.4 C)  98.5 F (36.9 C)  TempSrc: Oral Oral  Oral  SpO2: 98% 99% 92% 99%  Weight:    79.7 kg    General:  Pleasantly resting in bed, No acute distress. HEENT:  Normocephalic atraumatic.  Sclerae nonicteric, noninjected.  Extraocular movements intact bilaterally. Neck:  Without mass or deformity.  Trachea is midline. Lungs/chest: Postop bandage left anterior chest wall clean dry intact.  Clear to auscultate bilaterally without rhonchi, wheeze, or rales. Heart:  Regular rate and rhythm.  Without murmurs, rubs, or gallops. Abdomen:  Soft, nontender, nondistended.  Without guarding or rebound. Extremities: Without cyanosis, clubbing, edema, or obvious deformity. Vascular:  Dorsalis pedis and posterior tibial pulses palpable bilaterally. Skin:  Warm and dry, no erythema, no ulcerations.   The results of significant diagnostics from this hospitalization (including imaging, microbiology, ancillary and laboratory) are listed below for reference.     Microbiology: Recent Results (from the past 240 hour(s))  Urine culture     Status: None   Collection Time: 06/27/19  8:17 PM   Specimen: Urine, Clean Catch  Result Value Ref Range Status   Specimen Description URINE, CLEAN CATCH  Final   Special Requests NONE  Final   Culture   Final    NO GROWTH Performed at [facility], [address], [place], [place] [address]    Report Status 06/28/2019 FINAL  Final  SARS Coronavirus 2 (CEPHEID - Performed in [place] hospital lab), Hosp Order     Status: None   Collection Time: 06/27/19  9:14 PM   Specimen: Nasopharyngeal Swab  Result Value Ref Range Status   SARS Coronavirus 2 NEGATIVE NEGATIVE Final    Comment: (NOTE) If result is NEGATIVE SARS-CoV-2 target nucleic acids are NOT DETECTED. The SARS-CoV-2 RNA is generally detectable in upper and lower  respiratory specimens during the acute phase of infection. The lowest  concentration of SARS-CoV-2 viral copies this assay can detect is 250  copies / mL. A negative result does not preclude SARS-CoV-2 infection  and should not be used as the sole basis for treatment or other  patient management decisions.  A negative result may occur with  improper specimen collection / handling, submission of specimen other  than nasopharyngeal swab, presence of viral mutation(s) within the  areas targeted by this assay, and inadequate number of viral copies  (<250 copies / mL). A negative result must be combined with clinical  observations, patient history, and epidemiological information. If result is POSITIVE SARS-CoV-2 target nucleic acids are DETECTED. The SARS-CoV-2 RNA is generally detectable in upper and lower  respiratory specimens dur ing the acute phase of infection.  Positive  results are indicative of  active infection with SARS-CoV-2.  Clinical  correlation with patient history and other diagnostic information is  necessary to determine patient infection status.  Positive results do  not rule out bacterial infection or co-infection with other viruses. If result is PRESUMPTIVE POSTIVE SARS-CoV-2 nucleic acids MAY BE PRESENT.   A presumptive positive result was obtained on the submitted specimen  and confirmed on repeat testing.  While 2019 novel coronavirus  (SARS-CoV-2) nucleic acids may be  present in the submitted sample  additional confirmatory testing may be necessary for epidemiological  and / or clinical management purposes  to differentiate between  SARS-CoV-2 and other Sarbecovirus currently known to infect humans.  If clinically indicated additional testing with an alternate test  methodology [phone]) is advised. The SARS-CoV-2 RNA is generally  detectable in upper and lower respiratory sp ecimens during the acute  phase of infection. The expected result is Negative. Fact Sheet for Patients:  StrictlyIdeas.no Fact Sheet for Healthcare Providers: BankingDealers.co.za This test is not yet approved or cleared by the [place] FDA and has been authorized for detection and/or diagnosis of SARS-CoV-2 by FDA under an Emergency Use Authorization (EUA).  This EUA will remain in effect (meaning this test can be used) for the duration of the COVID-19 declaration under Section 564(b)(1) of the Act, 21 U.S.C. section 360bbb-3(b)(1), unless the authorization is terminated or revoked sooner. Performed at [facility], [place] [address] [place], [place] [address]      Labs: BNP (last 3 results) No results for input(s): BNP in the last 8760 hours. Basic Metabolic Panel: Recent Labs  Lab 06/27/19 1815 06/28/19 0213 06/29/19 0607  NA 133* 133* 133*  K 4.1 3.9 4.5  CL 103 104 103  CO2 21* 23 25  GLUCOSE 147* 138* 114*  BUN 20 17 18   CREATININE 1.06* 0.82 1.08*  CALCIUM 8.5* 8.3* 8.5*  MG  --  2.0  --   PHOS  --  3.0  --    Liver Function Tests: Recent Labs  Lab 06/28/19 0213 06/29/19 0607  AST 26 26  ALT 12 11  ALKPHOS 49 49  BILITOT 0.9 0.6  PROT 8.5* 8.8*  ALBUMIN 2.6* 2.6*   No results for input(s): LIPASE, AMYLASE in the last 168 hours. No results for input(s): AMMONIA in the last 168 hours. CBC: Recent Labs  Lab 06/27/19 1815 06/28/19 0014 06/28/19 0213 06/29/19 0607  WBC 3.9* 3.0* 2.5*  3.3*  NEUTROABS  --  1.7  --   --   HGB 9.1* 8.7* 8.6* 9.3*  HCT 30.1* 28.7* 27.9* 30.0*  MCV 92.0 92.3 91.2 91.7  PLT 139* 125* 124* 120*   Cardiac Enzymes: Recent Labs  Lab 06/27/19 2054  CKTOTAL 39   BNP: Invalid input(s): POCBNP CBG: Recent Labs  Lab 06/28/19 0735 06/28/19 1705 06/28/19 2111 06/29/19 0606 06/29/19 1144  GLUCAP 99 93 123* 96 107*   D-Dimer No results for input(s): DDIMER in the last 72 hours. Hgb A1c Recent Labs    06/28/19 0213  HGBA1C 5.5   Lipid Profile No results for input(s): CHOL, HDL, LDLCALC, TRIG, CHOLHDL, LDLDIRECT in the last 72 hours. Thyroid function studies Recent Labs    06/28/19 0213  TSH 0.893   Anemia work up Recent Labs    06/28/19 0014 06/28/19 1204  VITAMINB12 243 248  FOLATE 11.5 12.5  FERRITIN 301 317*  TIBC 307 295  IRON 35 25*  RETICCTPCT 3.9*  --    Urinalysis  Component Value Date/Time   COLORURINE AMBER (A) 06/27/2019 2020   APPEARANCEUR HAZY (A) 06/27/2019 2020   LABSPEC 1.018 06/27/2019 2020   PHURINE 5.0 06/27/2019 2020   GLUCOSEU >=500 (A) 06/27/2019 2020   HGBUR MODERATE (A) 06/27/2019 2020   HGBUR negative 12/09/2010 0936   BILIRUBINUR NEGATIVE 06/27/2019 2020   BILIRUBINUR Neg 01/21/2014 1502   [place] 06/27/2019 2020   PROTEINUR 30 (A) 06/27/2019 2020   UROBILINOGEN 0.2 01/21/2014 1502   UROBILINOGEN negative 12/09/2010 0936   NITRITE NEGATIVE 06/27/2019 2020   LEUKOCYTESUR NEGATIVE 06/27/2019 2020   Sepsis Labs Invalid input(s): PROCALCITONIN,  WBC,  LACTICIDVEN Microbiology Recent Results (from the past 240 hour(s))  Urine culture     Status: None   Collection Time: 06/27/19  8:17 PM   Specimen: Urine, Clean Catch  Result Value Ref Range Status   Specimen Description URINE, CLEAN CATCH  Final   Special Requests NONE  Final   Culture   Final    NO GROWTH Performed at [facility], [place] [address] [place],  [address]    Report Status 06/28/2019 FINAL   Final  SARS Coronavirus 2 (CEPHEID - Performed in [place] hospital lab), Hosp Order     Status: None   Collection Time: 06/27/19  9:14 PM   Specimen: Nasopharyngeal Swab  Result Value Ref Range Status   SARS Coronavirus 2 NEGATIVE NEGATIVE Final    Comment: (NOTE) If result is NEGATIVE SARS-CoV-2 target nucleic acids are NOT DETECTED. The SARS-CoV-2 RNA is generally detectable in upper and lower  respiratory specimens during the acute phase of infection. The lowest  concentration of SARS-CoV-2 viral copies this assay can detect is 250  copies / mL. A negative result does not preclude SARS-CoV-2 infection  and should not be used as the sole basis for treatment or other  patient management decisions.  A negative result may occur with  improper specimen collection / handling, submission of specimen other  than nasopharyngeal swab, presence of viral mutation(s) within the  areas targeted by this assay, and inadequate number of viral copies  (<250 copies / mL). A negative result must be combined with clinical  observations, patient history, and epidemiological information. If result is POSITIVE SARS-CoV-2 target nucleic acids are DETECTED. The SARS-CoV-2 RNA is generally detectable in upper and lower  respiratory specimens dur ing the acute phase of infection.  Positive  results are indicative of active infection with SARS-CoV-2.  Clinical  correlation with patient history and other diagnostic information is  necessary to determine patient infection status.  Positive results do  not rule out bacterial infection or co-infection with other viruses. If result is PRESUMPTIVE POSTIVE SARS-CoV-2 nucleic acids MAY BE PRESENT.   A presumptive positive result was obtained on the submitted specimen  and confirmed on repeat testing.  While 2019 novel coronavirus  (SARS-CoV-2) nucleic acids may be present in the submitted sample  additional confirmatory testing may be necessary for  epidemiological  and / or clinical management purposes  to differentiate between  SARS-CoV-2 and other Sarbecovirus currently known to infect humans.  If clinically indicated additional testing with an alternate test  methodology [id number]) is advised. The SARS-CoV-2 RNA is generally  detectable in upper and lower respiratory sp ecimens during the acute  phase of infection. The expected result is Negative. Fact Sheet for Patients:  StrictlyIdeas.no Fact Sheet for Healthcare Providers: BankingDealers.co.za This test is not yet approved or cleared by the [place] FDA and has  been authorized for detection and/or diagnosis of SARS-CoV-2 by FDA under an Emergency Use Authorization (EUA).  This EUA will remain in effect (meaning this test can be used) for the duration of the COVID-19 declaration under Section 564(b)(1) of the Act, 21 U.S.C. section 360bbb-3(b)(1), unless the authorization is terminated or revoked sooner. Performed at [facility], [place] [address] [place], [place] [address]      Time coordinating discharge: Over 30 minutes  SIGNED:   Little Ishikawa, DO [facility] 06/29/2019, 11:50 AM Pager   If 7PM-7AM, please contact night-coverage [email or web address] Password [email or web address]

## 2019-06-29 NOTE — Discharge Instructions (Signed)
° ° °  Supplemental Discharge Instructions for  Pacemaker/Defibrillator Patients  Activity No heavy lifting or vigorous activity with your left/right arm for 6 to 8 weeks.  Do not raise your left/right arm above your head for one week.  Gradually raise your affected arm as drawn below.              07/02/2019                  07/03/2019                   07/04/2019                07/05/2019 __  NO DRIVING for 6 months  WOUND CARE - Keep the wound area clean and dry.  Do not get this area wet, no showers until cleared to at your wound check visit - The tape/steri-strips on your wound will fall off; do not pull them off.  No bandage is needed on the site.  DO  NOT apply any creams, oils, or ointments to the wound area. - If you notice any drainage or discharge from the wound, any swelling or bruising at the site, or you develop a fever > 101? F after you are discharged home, call the office at once.  Special Instructions - You are still able to use cellular telephones; use the ear opposite the side where you have your pacemaker/defibrillator.  Avoid carrying your cellular phone near your device. - When traveling through airports, show security personnel your identification card to avoid being screened in the metal detectors.  Ask the security personnel to use the hand wand. - Avoid arc welding equipment, MRI testing (magnetic resonance imaging), TENS units (transcutaneous nerve stimulators).  Call the office for questions about other devices. - Avoid electrical appliances that are in poor condition or are not properly grounded. - Microwave ovens are safe to be near or to operate.  Additional information for defibrillator patients should your device go off: - If your device goes off ONCE and you feel fine afterward, notify the device clinic nurses. - If your device goes off ONCE and you do not feel well afterward, call 911. - If your device goes off TWICE, call 911. - If your device goes off THREE  times in one day, call 911.  DO NOT DRIVE YOURSELF OR A FAMILY MEMBER WITH A DEFIBRILLATOR TO THE HOSPITAL--CALL 911.

## 2019-06-29 NOTE — Progress Notes (Addendum)
Progress Note  Patient Name: [name] Date of Encounter: 06/29/2019  Primary Cardiologist: Sanda Klein, MD   Subjective   No CP, palpitations, no SOB, cough, mild aching at device site  Inpatient Medications    Scheduled Meds:  ALPRAZolam  0.5 mg Oral Daily   ALPRAZolam  1 mg Oral QHS   carvedilol  25 mg Oral BID WC   citalopram  20 mg Oral Daily   citalopram  40 mg Oral QHS   fenofibrate  160 mg Oral QHS   furosemide  20 mg Oral Daily   insulin aspart  0-5 Units Subcutaneous QHS   insulin aspart  0-9 Units Subcutaneous TID WC   losartan  25 mg Oral Daily   rosuvastatin  20 mg Oral QHS   sodium chloride flush  3 mL Intravenous Q12H   Continuous Infusions:  PRN Meds: acetaminophen **OR** acetaminophen, acetaminophen, ondansetron **OR** ondansetron (ZOFRAN) IV   Vital Signs    Vitals:   06/28/19 2054 06/29/19 0119 06/29/19 0131 06/29/19 0545  BP: (!) 135/49 (!) 107/44 97/64 (!) 146/47  Pulse: (!) 50 (!) 48 88 (!) 52  Resp: 20 20 20 20   Temp: 98.4 F (36.9 C) 97.6 F (36.4 C)  98.5 F (36.9 C)  TempSrc: Oral Oral  Oral  SpO2: 98% 99% 92% 99%  Weight:    79.7 kg    Intake/Output Summary (Last 24 hours) at 06/29/2019 1104 Last data filed at 06/29/2019 0933 Gross per 24 hour  Intake 972 ml  Output 600 ml  Net 372 ml   Last 3 Weights 06/29/2019 01/10/2019 12/06/2018  Weight (lbs) 175 lb 12.8 oz 196 lb 3.2 oz 197 lb 3.2 oz  Weight (kg) 79.742 kg 88.996 kg 89.449 kg      Telemetry    SR - Personally Reviewed  ECG    SR, LBBB - Personally Reviewed  Physical Exam   GEN: No acute distress.  Head: R forehead swelling looks less, + ecchymosis, and periorbital ecchymosis Neck: No JVD Cardiac: RRR, no murmurs, rubs, or gallops.  Respiratory: CTA b/l. GI: Soft, nontender, non-distended  MS: No edema; No deformity. Neuro:  Nonfocal  Psych: Normal affect   ICD site: is stable, no bleeding or hematoma  Labs    High Sensitivity  Troponin:   Recent Labs  Lab 06/27/19 2054 06/27/19 2201 06/28/19 0345 06/28/19 0816  TROPONINIHS 44* 40* 32* 32*      Cardiac EnzymesNo results for input(s): TROPONINI in the last 168 hours. No results for input(s): TROPIPOC in the last 168 hours.   Chemistry Recent Labs  Lab 06/27/19 1815 06/28/19 0213 06/29/19 0607  NA 133* 133* 133*  K 4.1 3.9 4.5  CL 103 104 103  CO2 21* 23 25  GLUCOSE 147* 138* 114*  BUN 20 17 18   CREATININE 1.06* 0.82 1.08*  CALCIUM 8.5* 8.3* 8.5*  PROT  --  8.5* 8.8*  ALBUMIN  --  2.6* 2.6*  AST  --  26 26  ALT  --  12 11  ALKPHOS  --  49 49  BILITOT  --  0.9 0.6  GFRNONAA 52* >60 51*  GFRAA >60 >60 59*  ANIONGAP 9 6 5      Hematology Recent Labs  Lab 06/28/19 0014 06/28/19 0213 06/29/19 0607  WBC 3.0* 2.5* 3.3*  RBC 3.11*   3.11* 3.06* 3.27*  HGB 8.7* 8.6* 9.3*  HCT 28.7* 27.9* 30.0*  MCV 92.3 91.2 91.7  MCH 28.0 28.1 28.4  MCHC 30.3 30.8 31.0  RDW 18.1* 18.1* 18.2*  PLT 125* 124* 120*    BNPNo results for input(s): BNP, PROBNP in the last 168 hours.   DDimer No results for input(s): DDIMER in the last 168 hours.   Radiology    Dg Chest 2 View Result Date: 06/29/2019 CLINICAL DATA:  Status post defibrillator placement. EXAM: CHEST - 2 VIEW COMPARISON:  Chest radiograph 06/27/2019 FINDINGS: Multi lead AICD device overlies the left hemithorax. Lateral view limited due to overlapping soft tissue. Stable cardiomegaly. Low lung volumes. Bibasilar atelectasis. No pleural effusion or pneumothorax. Thoracic spine degenerative changes. IMPRESSION: Cardiomegaly. Low lung volumes.  Bibasilar atelectasis. Electronically Signed   By: Lovey Newcomer M.D.   On: 06/29/2019 08:53       Ct Coronary Morph W/cta Cor W/score W/ca W/cm &/or Wo/cm  Result Date: 06/28/2019 EXAM: OVER-READ INTERPRETATION  CT CHEST The following report is an over-read performed by radiologist Dr. Vinnie Langton of [facility], [place] on 06/28/2019. This over-read  does not include interpretation of cardiac or coronary anatomy or pathology. The coronary calcium score/coronary CTA interpretation by the cardiologist is attached. COMPARISON:  CTA chest 01/27/2017. FINDINGS: Extracardiac findings will be described separately under dictation for contemporaneously obtained CTA chest, abdomen and pelvis. IMPRESSION: Please see separate dictation for contemporaneously obtained CTA chest, abdomen and pelvis dated 06/28/2019 for full description of relevant extracardiac findings. Electronically Signed   By: Vinnie Langton M.D.   On: 06/28/2019 14:41   Ct Angio Chest/abd/pel For Dissection W And/or W/wo  Result Date: 06/28/2019 CLINICAL DATA:  73 year old female with history of aortic valve replacement with known thoracic aortic dissection. EXAM: CT ANGIOGRAPHY CHEST, ABDOMEN AND PELVIS TECHNIQUE: Multidetector CT imaging through the chest, abdomen and pelvis was performed using the standard protocol during bolus administration of intravenous contrast. Multiplanar reconstructed images and MIPs were obtained and reviewed to evaluate the vascular anatomy. CONTRAST:  38mL OMNIPAQUE IOHEXOL 350 MG/ML SOLN COMPARISON:  Chest CT 05/28/2019. CTA chest, abdomen and pelvis 02/09/2018. FINDINGS: CTA CHEST FINDINGS Cardiovascular: Heart size is enlarged with left ventricular dilatation. There is no significant pericardial fluid, thickening or pericardial calcification. There is aortic atherosclerosis, as well as atherosclerosis of the great vessels of the mediastinum and the coronary arteries, including calcified atherosclerotic plaque in the left anterior descending and right coronary arteries. Thickening calcification of the aortic valve. Status post median sternotomy for graft replacement of the ascending thoracic aorta. Chronic aortic dissection flap originating immediately distal to the graft involving the aortic arch and the descending thoracic aorta. The dissection flap does not extend  into the proximal great vessels. Both the true and false lumen are patent. Ectasia of descending thoracic aorta (3.5 x 2.8 cm). Dissection flap extends into the pelvis (discussed below). Mediastinum/Lymph Nodes: Multiple prominent borderline enlarged mediastinal lymph nodes measuring up to 11 mm in short axis in the low right paratracheal nodal station, nonspecific. Esophagus is unremarkable in appearance. No axillary lymphadenopathy. Lungs/Pleura: No acute consolidative airspace disease. No suspicious. No pleural effusions appearing pulmonary nodules or masses. Musculoskeletal/Soft Tissues: Median sternotomy wires. There are no aggressive appearing lytic or blastic lesions noted in the visualized portions of the skeleton. CTA ABDOMEN AND PELVIS FINDINGS Hepatobiliary: No suspicious cystic or solid hepatic lesions. No intra or extrahepatic biliary ductal dilatation. Gallbladder is normal in appearance. Pancreas: No pancreatic mass. No pancreatic ductal dilatation. No pancreatic or peripancreatic fluid collections or inflammatory changes. Spleen: Unremarkable. Adrenals/Urinary Tract: Nonobstructive calculi within the left renal collecting system, largest  of which is in the lower pole measuring 6 mm. Multiple low-attenuation lesions in the kidneys bilaterally, measuring up to 3.9 cm in the interpolar region of the right kidney, compatible with simple cysts. Other subcentimeter low-attenuation lesions in the kidneys bilaterally, too small to characterize, but statistically likely to represent tiny cysts. No hydroureteronephrosis. Urinary bladder is normal in appearance. Stomach/Bowel: Normal appearance of the stomach. No pathologic dilatation of small bowel or colon. Numerous colonic diverticulae are noted, particularly in the sigmoid colon, without surrounding inflammatory changes to suggest an acute diverticulitis at this time. The appendix is not confidently identified and may be surgically absent. Regardless, there  are no inflammatory changes noted adjacent to the cecum to suggest the presence of an acute appendicitis at this time. Vascular/Lymphatic: Aortic atherosclerosis with diffuse ectasia of the abdominal aorta measuring up to 2.8 x 2.2 cm with dissection flap extending into the distal left common iliac artery. The true lumen feeds the celiac axis, superior mesenteric artery, inferior mesenteric artery and right renal artery, all of which are widely patent at this time. The false lumen feeds the left renal artery which is widely patent. Ectasia of left common iliac artery (which contains a dissection flap) which measures up to 1.7 x 1.1 cm. Vascular findings and measurements pertinent to potential TAVR procedure, as detailed below. No pathologically lymphadenopathy noted in the abdomen or pelvis. Reproductive: Status post hysterectomy. Ovaries are not confidently identified may be surgically absent or atrophic. Other: No significant volume of ascites.  No pneumoperitoneum. Musculoskeletal: There are no aggressive appearing lytic or blastic lesions noted in the visualized portions of the skeleton. VASCULAR MEASUREMENTS PERTINENT TO TAVR: AORTA: Minimal Aortic Diameter-1.4 x 1.2 cm mm (minimal diameter of the true lumen in the region of the infrarenal abdominal aorta) Severity of Aortic Calcification-moderate RIGHT PELVIS: Right Common Iliac Artery - Minimal Diameter-10.6 x 10.3 mm Tortuosity-mild Calcification-minimal Right External Iliac Artery - Minimal Diameter-6.7 x 6.7 mm Tortuosity-moderate Calcification-none Right Common Femoral Artery - Minimal Diameter-6.5 x 6.6 mm Tortuosity-mild Calcification-none LEFT PELVIS: Left Common Iliac Artery - Minimal Diameter-11.0 x 8.6 mm (true lumen minimal diameter) Tortuosity-mild Calcification-minimal Left External Iliac Artery - Minimal Diameter-6.6 x 6.7 mm Tortuosity-moderate Calcification-none Left Common Femoral Artery - Minimal Diameter-6.3 x 6.0 mm Tortuosity-mild  Calcification-none Review of the MIP images confirms the above findings. IMPRESSION: 1. Vascular findings and measurements pertinent to potential TAVR procedure, as detailed above. 2. Thickening calcification of the aortic valve, compatible with the reported clinical history of severe aortic stenosis. 3. Chronic thoracoabdominal aortic dissection status post graft repair in the ascending thoracic aorta, which appears similar to the prior examination, as detailed above. This dissection flap does not extend into the great vessels, but does extend into the distal aspect of the left common iliac artery. 4. Cardiomegaly with left ventricular dilatation. 5. Nonobstructive calculi within the left renal collecting system measuring up to 6 mm in the lower pole. 6. Colonic diverticulosis without evidence of acute diverticulitis at this time. 7. Additional incidental findings, as above. Electronically Signed   By: Vinnie Langton M.D.   On: 06/28/2019 15:35    Cardiac Studies   01/10/2019 TTE IMPRESSIONS 1. The left ventricle has low normal systolic function, with an ejection fraction of 50-55%. The cavity size was normal. There is mildly increased left ventricular wall thickness. Left ventricular diastolic Doppler parameters are consistent with  pseudonormalization. 2. The right ventricle has normal systolic function. The cavity was normal. There is no increase in right ventricular  wall thickness. 3. Left atrial size was mildly dilated. 4. The tricuspid valve is normal in structure. 5. The aortic valve is tricuspid There is severe thickening and severe calcifcation of the aortic valve. Aortic valve regurgitation is moderate by color flow Doppler. There is moderate stenosis of the aortic valve, peak velocity 3.18m/s and mean  gradient 38mmHg. 6. The pulmonic valve was normal in structure. 7. There is mild dilatation at the level of the sinuses of Valsalva measuring 38 mm. 8. Right atrial pressure is  estimated at 3 mmHg. 9. The mitral valve is normal in structure.  FINDINGS Left Ventricle: The left ventricle has low normal systolic function, with an ejection fraction of 50-55%. The cavity size was normal. There is mildly increased left ventricular wall thickness. Left ventricular diastolic Doppler parameters are consistent with pseudonormalization Right Ventricle: The right ventricle has normal systolic function. The cavity was normal. There is no increase in right ventricular wall thickness. Left Atrium: left atrial size was mildly dilated Right Atrium: right atrial size was normal in size Right atrial pressure is estimated at 3 mmHg. Interatrial Septum: No atrial level shunt detected by color flow Doppler. Pericardium: There is no evidence of pericardial effusion. Mitral Valve: The mitral valve is normal in structure. Mitral valve regurgitation is not visualized by color flow Doppler. Tricuspid Valve: The tricuspid valve is normal in structure. Tricuspid valve regurgitation was not visualized by color flow Doppler. Aortic Valve: The aortic valve is tricuspid There is severe thickening of the aortic valve. Aortic valve regurgitation is moderate by color flow Doppler. There is moderate stenosis of the aortic valve, with a calculated valve area of 0.83 cm. Pulmonic Valve: The pulmonic valve was normal in structure. Pulmonic valve regurgitation is trivial by color flow Doppler. Aorta: There is mild dilatation at the level of the sinuses of Valsalva measuring 38 mm. Venous: The inferior vena cava is normal in size with greater than 50% respiratory variability.  LEFT VENTRICLE PLAX 2D (Teich) LV EF: 61.9 % Diastology LVIDd: 4.80 cm LV e' lateral: 6.42 cm/s LVIDs: 3.20 cm LV E/e' lateral: 14.9 LV PW: 1.30 cm LV e' medial: 4.03 cm/s LV IVS: 1.30 cm LV E/e' medial: 23.7 LVOT diam: 1.80 cm LV SV: 67 ml LVOT Area:  2.54 cm  RIGHT VENTRICLE RV Basal diam: 2.60 cm RVSP: 28.4 mmHg  LEFT ATRIUM Index RIGHT ATRIUM Index LA diam: 3.70 cm 1.99 cm/m RA Pressure: 3 mmHg LA Vol (A2C): 63.8 ml 34.39 ml/m RA Area: 15.70 cm LA Vol (A4C): 70.7 ml 38.10 ml/m RA Volume: 41.10 ml 22.15 ml/m LA Biplane Vol: 67.7 ml 36.49 ml/m AORTIC VALVE AV Area (Vmax): 0.86 cm AV Area (Vmean): 0.87 cm AV Area (VTI): 0.83 cm AV Vmax: 356.00 cm/s AV Vmean: 244.000 cm/s AV VTI: 0.953 m AV Peak Grad: 50.7 mmHg AV Mean Grad: 28.0 mmHg LVOT Vmax: 121.00 cm/s LVOT Vmean: 83.800 cm/s LVOT VTI: 0.312 m LVOT/AV VTI ratio: 0.33 AR PHT: 663 msec  AORTA Ao Root diam: 3.80 cm Ao Asc diam: 3.10 cm  MITRAL VALVE TRICUSPID VALVE MV Area (PHT): 5.02 cm TR Peak grad: 25.4 mmHg MV PHT: 43.79 msec TR Vmax: 252.00 cm/s MV Decel Time: 151 msec RVSP: 28.4 mmHg MV E velocity: 95.50 cm/s MV A velocity: 73.50 cm/s MV E/A ratio: 1.30     L/RHC 11/02/18 ([facility]) - No angiographic evidence of significant disease.  RA mean 6 RV 45/10 PA 45/16 (26) PCWP 12 AO 90% Cardiac Output (Fick) 4.6 Cardiac Index (Fick) 2.4  SVR 1443  PVR 3 WU LVEDP 18 Mean AV gradient: 21.2 mmHg  Peak to peak gradient: 16 Valve area (gorlin): 1.1 cm2 (index 0.6)  Patient Profile     73 y.o. female with a hx of chronic CHF (diastolic) >> new NICM, DM,  h/o repaired type A aortic dissection (resuspended native aortic valve), SSS/sinus arrest w/PPM, HTN, AFlutter, morbid obesity admitted with syncope (associated with trauma), PPM interrogation revealed VT coinciding with timing of her syncope.  Assessment & Plan    1. VT      Dr. Lovena Le has seen and examined the patient this AM No AAD at this time Site is stable CXR without pneumothorax, (pt without  respiratory symptoms or exam findings, encouraged OOB and deep breathing) Device check this AM with stable measurements Wound care and activity restrictions were discussed with the patient Routine post device follow up is in place  Dr. Lovena Le discussed with the patient [place] law, and recommendation, no driving 6 months   3. Known AS 4. Known thoracic aortic dissection     Ongoing w/u in process      Discussion of possible TAVR 5. Anemia     Not far from December labs  6. AFlutter (remotely)     Xarelto held here     In review of note, Dr. Sallyanne Kuster no plans to resume Xarelto  EP service will sign off though remain available, please recall if needed   For questions or updates, please contact [place] Please consult [email or web address] for contact info under   Signed, Baldwin [place], PA-C  06/29/2019, 11:04 AM    EP Attending  Patient seen and examined. Agree with the findings as noted above. The patient is doing well after upgrade from a DDD PPM to a DDD ICD for secondary prevention of VT with syncope. I'll defer plans for treatment of AS to Dr. Loletha Grayer. She is stable for DC from EP perspective. No AA drug therapy for now but if she has more VT therapy in the coming months then I would consider starting sotalol.  Mikle Bosworth.D.

## 2019-06-29 NOTE — Progress Notes (Addendum)
Progress Note  Patient Name: [name] Date of Encounter: 06/29/2019  Primary Cardiologist: Sanda Klein, MD   Subjective   07/31: feels achy from injuries, but otherwise well, has not been out of bed since admission, goose egg is smaller, no palpitations or presyncope. Upset that she cannot drive for 6 months  88/41: Anxious and scared, but currently without complaints of angina, dyspnea or presyncope. Describes getting out of the shower around 1515h, going to her bed and then waking up on the floor.  Did not have prodromal chest pain, palpitations or dizziness. Pacemaker interrogation shows an episode of high ventricular rate with clear A-V dissociation occurring at 1522h, average ventricular rate 199 bpm, but with periods of cycle length 230 ms (260 bpm). She has been trying hard to lose weight, reports 40 pound weight loss since winter, as she is preparing to be in better shape for anticipated TAVR.  Pacemaker interrogation shows normal device function.  [employer] Adapta dual-chamber device estimated generator longevity 9.5 years, atrial and ventricular leads implanted 2007 with normal parameters (no reported P waves due to high-frequency atrial pacing, otherwise excellent pacing thresholds and impedances).  A brief episode of nonsustained VT is recorded on July 5.  An episode of high ventricular rate lasting for 2 minutes and 42 seconds is reported on July 29, coinciding exactly with the reported syncope, only marker tracing available, no electrogram.  There is clear A-V dissociation.  There is suspicion of intermittent ventricular undersensing in the tachycardia.  Inpatient Medications    Scheduled Meds:  ALPRAZolam  0.5 mg Oral Daily   ALPRAZolam  1 mg Oral QHS   carvedilol  25 mg Oral BID WC   citalopram  20 mg Oral Daily   citalopram  40 mg Oral QHS   fenofibrate  160 mg Oral QHS   furosemide  20 mg Oral Daily   insulin aspart  0-5 Units Subcutaneous QHS    insulin aspart  0-9 Units Subcutaneous TID WC   losartan  25 mg Oral Daily   rosuvastatin  20 mg Oral QHS   sodium chloride flush  3 mL Intravenous Q12H   Continuous Infusions:  PRN Meds: acetaminophen **OR** acetaminophen, acetaminophen, ondansetron **OR** ondansetron (ZOFRAN) IV   Vital Signs    Vitals:   06/28/19 2054 06/29/19 0119 06/29/19 0131 06/29/19 0545  BP: (!) 135/49 (!) 107/44 97/64 (!) 146/47  Pulse: (!) 50 (!) 48 88 (!) 52  Resp: 20 20 20 20   Temp: 98.4 F (36.9 C) 97.6 F (36.4 C)  98.5 F (36.9 C)  TempSrc: Oral Oral  Oral  SpO2: 98% 99% 92% 99%  Weight:    79.7 kg    Intake/Output Summary (Last 24 hours) at 06/29/2019 0933 Last data filed at 06/29/2019 0933 Gross per 24 hour  Intake 972 ml  Output 600 ml  Net 372 ml   Last 3 Weights 06/29/2019 01/10/2019 12/06/2018  Weight (lbs) 175 lb 12.8 oz 196 lb 3.2 oz 197 lb 3.2 oz  Weight (kg) 79.742 kg 88.996 kg 89.449 kg      Telemetry    A pacing, HR 49 at times while sleeping- Personally Reviewed  ECG    Atrial paced, atypical left bundle branch block- Personally Reviewed  Previous ECG that shows atrial paced, ventricular paced rhythm has prominent positive R waves in lead V1, consistent with epicardial position of the distal electrode.  Review of chest CT confirms the right ventricular lead appears to be transparietal.  Physical  Exam   General: Well developed, well nourished, female in no acute distress Head: Eyes PERRLA, No xanthomas.   Normocephalic, ecchymosis and soft tissue injuries R periorbital area Lungs: Clear bilaterally to auscultation. Incision L subclavian w/out ecchymosis or hematoma Heart: HRRR S1 S2, without RG. 2/6 SEM  Pulses are 2+ & equal. No JVD. Abdomen: Bowel sounds are present, abdomen soft and non-tender without masses or  hernias noted. Msk: Normal strength and tone for age. Extremities: No clubbing, cyanosis or edema.    Skin:  No rashes or lesions noted. Neuro: Alert and  oriented X 3. Psych:  Good affect, responds appropriately  Labs    High Sensitivity Troponin:   Recent Labs  Lab 06/27/19 2054 06/27/19 2201 06/28/19 0345 06/28/19 0816  TROPONINIHS 44* 40* 32* 32*     Chemistry Recent Labs  Lab 06/27/19 1815 06/28/19 0213 06/29/19 0607  NA 133* 133* 133*  K 4.1 3.9 4.5  CL 103 104 103  CO2 21* 23 25  GLUCOSE 147* 138* 114*  BUN 20 17 18   CREATININE 1.06* 0.82 1.08*  CALCIUM 8.5* 8.3* 8.5*  PROT  --  8.5* 8.8*  ALBUMIN  --  2.6* 2.6*  AST  --  26 26  ALT  --  12 11  ALKPHOS  --  49 49  BILITOT  --  0.9 0.6  GFRNONAA 52* >60 51*  GFRAA >60 >60 59*  ANIONGAP 9 6 5      Hematology Recent Labs  Lab 06/28/19 0014 06/28/19 0213 06/29/19 0607  WBC 3.0* 2.5* 3.3*  RBC 3.11*   3.11* 3.06* 3.27*  HGB 8.7* 8.6* 9.3*  HCT 28.7* 27.9* 30.0*  MCV 92.3 91.2 91.7  MCH 28.0 28.1 28.4  MCHC 30.3 30.8 31.0  RDW 18.1* 18.1* 18.2*  PLT 125* 124* 120*   Iron/TIBC/Ferritin/ %Sat    Component Value Date/Time   IRON 25 (L) 06/28/2019 1204   TIBC 295 06/28/2019 1204   FERRITIN 317 (H) 06/28/2019 1204   IRONPCTSAT 8 (L) 06/28/2019 1204     Radiology    Dg Chest 2 View  Result Date: 06/29/2019 CLINICAL DATA:  Status post defibrillator placement. EXAM: CHEST - 2 VIEW COMPARISON:  Chest radiograph 06/27/2019 FINDINGS: Multi lead AICD device overlies the left hemithorax. Lateral view limited due to overlapping soft tissue. Stable cardiomegaly. Low lung volumes. Bibasilar atelectasis. No pleural effusion or pneumothorax. Thoracic spine degenerative changes. IMPRESSION: Cardiomegaly. Low lung volumes.  Bibasilar atelectasis. Electronically Signed   By: Lovey Newcomer M.D.   On: 06/29/2019 08:53   Dg Chest 2 View  Result Date: 06/27/2019 CLINICAL DATA:  Fall in her bedroom. EXAM: CHEST - 2 VIEW COMPARISON:  Radiograph 06/18/2011, chest CT 01/27/2017 FINDINGS: Post median sternotomy. Left-sided pacemaker in place. Mild cardiomegaly with unchanged  mediastinal contours. No acute airspace disease, pleural effusion, or pneumothorax. No acute osseous abnormalities are seen. IMPRESSION: Mild cardiomegaly without acute abnormality. Electronically Signed   By: Keith Rake M.D.   On: 06/27/2019 22:03   Ct Head Wo Contrast  Result Date: 06/27/2019 CLINICAL DATA:  Head trauma, headache, anticoagulation EXAM: CT HEAD WITHOUT CONTRAST TECHNIQUE: Contiguous axial images were obtained from the base of the skull through the vertex without intravenous contrast. COMPARISON:  02/09/2013 FINDINGS: Brain: No evidence of acute infarction, hemorrhage, hydrocephalus, extra-axial collection or mass lesion/mass effect. Mild periventricular white matter hypodensity. Vascular: No hyperdense vessel or unexpected calcification. Skull: Normal. Negative for fracture or focal lesion. Sinuses/Orbits: No acute finding. Other: Soft tissue hematoma  and laceration of the right forehead. IMPRESSION: 1.  No acute intracranial pathology. 2.  Soft tissue hematoma and laceration of the right forehead. Electronically Signed   By: Eddie Candle M.D.   On: 06/27/2019 20:00   Ct Cervical Spine Wo Contrast  Result Date: 06/27/2019 CLINICAL DATA:  Fall, headache EXAM: CT CERVICAL SPINE WITHOUT CONTRAST TECHNIQUE: Multidetector CT imaging of the cervical spine was performed without intravenous contrast. Multiplanar CT image reconstructions were also generated. COMPARISON:  None. FINDINGS: Alignment: Normal cervical lordosis. Skull base and vertebrae: No acute fracture. No primary bone lesion or focal pathologic process. Soft tissues and spinal canal: No prevertebral fluid or swelling. No visible canal hematoma. Disc levels: Mild degenerative changes of the mid/lower cervical spine. Spinal canal is patent. Upper chest: Visualized lung apices are clear. Other: Visualized thyroid is unremarkable. IMPRESSION: No evidence of traumatic injury to the cervical spine. Mild degenerative changes.  Electronically Signed   By: Julian Hy M.D.   On: 06/27/2019 21:09   Ct Coronary Morph W/cta Cor W/score W/ca W/cm &/or Wo/cm  Result Date: 06/28/2019 EXAM: OVER-READ INTERPRETATION  CT CHEST The following report is an over-read performed by radiologist Dr. Vinnie Langton of [facility], [place] on 06/28/2019. This over-read does not include interpretation of cardiac or coronary anatomy or pathology. The coronary calcium score/coronary CTA interpretation by the cardiologist is attached. COMPARISON:  CTA chest 01/27/2017. FINDINGS: Extracardiac findings will be described separately under dictation for contemporaneously obtained CTA chest, abdomen and pelvis. IMPRESSION: Please see separate dictation for contemporaneously obtained CTA chest, abdomen and pelvis dated 06/28/2019 for full description of relevant extracardiac findings. Electronically Signed   By: Vinnie Langton M.D.   On: 06/28/2019 14:41   Ct Angio Chest/abd/pel For Dissection W And/or W/wo  Result Date: 06/28/2019 CLINICAL DATA:  73 year old female with history of aortic valve replacement with known thoracic aortic dissection. EXAM: CT ANGIOGRAPHY CHEST, ABDOMEN AND PELVIS TECHNIQUE: Multidetector CT imaging through the chest, abdomen and pelvis was performed using the standard protocol during bolus administration of intravenous contrast. Multiplanar reconstructed images and MIPs were obtained and reviewed to evaluate the vascular anatomy. CONTRAST:  70mL OMNIPAQUE IOHEXOL 350 MG/ML SOLN COMPARISON:  Chest CT 05/28/2019. CTA chest, abdomen and pelvis 02/09/2018. FINDINGS: CTA CHEST FINDINGS Cardiovascular: Heart size is enlarged with left ventricular dilatation. There is no significant pericardial fluid, thickening or pericardial calcification. There is aortic atherosclerosis, as well as atherosclerosis of the great vessels of the mediastinum and the coronary arteries, including calcified atherosclerotic plaque in the left anterior  descending and right coronary arteries. Thickening calcification of the aortic valve. Status post median sternotomy for graft replacement of the ascending thoracic aorta. Chronic aortic dissection flap originating immediately distal to the graft involving the aortic arch and the descending thoracic aorta. The dissection flap does not extend into the proximal great vessels. Both the true and false lumen are patent. Ectasia of descending thoracic aorta (3.5 x 2.8 cm). Dissection flap extends into the pelvis (discussed below). Mediastinum/Lymph Nodes: Multiple prominent borderline enlarged mediastinal lymph nodes measuring up to 11 mm in short axis in the low right paratracheal nodal station, nonspecific. Esophagus is unremarkable in appearance. No axillary lymphadenopathy. Lungs/Pleura: No acute consolidative airspace disease. No suspicious. No pleural effusions appearing pulmonary nodules or masses. Musculoskeletal/Soft Tissues: Median sternotomy wires. There are no aggressive appearing lytic or blastic lesions noted in the visualized portions of the skeleton. CTA ABDOMEN AND PELVIS FINDINGS Hepatobiliary: No suspicious cystic or solid hepatic lesions. No intra  or extrahepatic biliary ductal dilatation. Gallbladder is normal in appearance. Pancreas: No pancreatic mass. No pancreatic ductal dilatation. No pancreatic or peripancreatic fluid collections or inflammatory changes. Spleen: Unremarkable. Adrenals/Urinary Tract: Nonobstructive calculi within the left renal collecting system, largest of which is in the lower pole measuring 6 mm. Multiple low-attenuation lesions in the kidneys bilaterally, measuring up to 3.9 cm in the interpolar region of the right kidney, compatible with simple cysts. Other subcentimeter low-attenuation lesions in the kidneys bilaterally, too small to characterize, but statistically likely to represent tiny cysts. No hydroureteronephrosis. Urinary bladder is normal in appearance.  Stomach/Bowel: Normal appearance of the stomach. No pathologic dilatation of small bowel or colon. Numerous colonic diverticulae are noted, particularly in the sigmoid colon, without surrounding inflammatory changes to suggest an acute diverticulitis at this time. The appendix is not confidently identified and may be surgically absent. Regardless, there are no inflammatory changes noted adjacent to the cecum to suggest the presence of an acute appendicitis at this time. Vascular/Lymphatic: Aortic atherosclerosis with diffuse ectasia of the abdominal aorta measuring up to 2.8 x 2.2 cm with dissection flap extending into the distal left common iliac artery. The true lumen feeds the celiac axis, superior mesenteric artery, inferior mesenteric artery and right renal artery, all of which are widely patent at this time. The false lumen feeds the left renal artery which is widely patent. Ectasia of left common iliac artery (which contains a dissection flap) which measures up to 1.7 x 1.1 cm. Vascular findings and measurements pertinent to potential TAVR procedure, as detailed below. No pathologically lymphadenopathy noted in the abdomen or pelvis. Reproductive: Status post hysterectomy. Ovaries are not confidently identified may be surgically absent or atrophic. Other: No significant volume of ascites.  No pneumoperitoneum. Musculoskeletal: There are no aggressive appearing lytic or blastic lesions noted in the visualized portions of the skeleton. VASCULAR MEASUREMENTS PERTINENT TO TAVR: AORTA: Minimal Aortic Diameter-1.4 x 1.2 cm mm (minimal diameter of the true lumen in the region of the infrarenal abdominal aorta) Severity of Aortic Calcification-moderate RIGHT PELVIS: Right Common Iliac Artery - Minimal Diameter-10.6 x 10.3 mm Tortuosity-mild Calcification-minimal Right External Iliac Artery - Minimal Diameter-6.7 x 6.7 mm Tortuosity-moderate Calcification-none Right Common Femoral Artery - Minimal Diameter-6.5 x 6.6  mm Tortuosity-mild Calcification-none LEFT PELVIS: Left Common Iliac Artery - Minimal Diameter-11.0 x 8.6 mm (true lumen minimal diameter) Tortuosity-mild Calcification-minimal Left External Iliac Artery - Minimal Diameter-6.6 x 6.7 mm Tortuosity-moderate Calcification-none Left Common Femoral Artery - Minimal Diameter-6.3 x 6.0 mm Tortuosity-mild Calcification-none Review of the MIP images confirms the above findings. IMPRESSION: 1. Vascular findings and measurements pertinent to potential TAVR procedure, as detailed above. 2. Thickening calcification of the aortic valve, compatible with the reported clinical history of severe aortic stenosis. 3. Chronic thoracoabdominal aortic dissection status post graft repair in the ascending thoracic aorta, which appears similar to the prior examination, as detailed above. This dissection flap does not extend into the great vessels, but does extend into the distal aspect of the left common iliac artery. 4. Cardiomegaly with left ventricular dilatation. 5. Nonobstructive calculi within the left renal collecting system measuring up to 6 mm in the lower pole. 6. Colonic diverticulosis without evidence of acute diverticulitis at this time. 7. Additional incidental findings, as above. Electronically Signed   By: Vinnie Langton M.D.   On: 06/28/2019 15:35    Cardiac Studies   Comprehensive pacemaker interrogation described above.  ECHO: 06/28/2019  1. The left ventricle has normal systolic function with an ejection  fraction of 60-65%. The cavity size was normal. There is mildly increased left ventricular wall thickness. Left ventricular diastolic Doppler parameters are indeterminate.  2. The right ventricle has normal systolic function. The cavity was normal. There is no increase in right ventricular wall thickness.  3. The aortic valve is abnormal. Moderate calcification of the aortic valve. Aortic valve regurgitation is mild to moderate by color flow Doppler. Mild  stenosis of the aortic valve, mean systolic gradient 16 mmHg. Calculated valve area approximately  1.25 cm2 using LVOT diameter of 2 cm.  4. The inferior vena cava was normal in size with <50% respiratory variability.  FINDINGS  Left Ventricle: The left ventricle has normal systolic function, with an ejection fraction of 60-65%. The cavity size was normal. There is mildly increased left ventricular wall thickness. Left ventricular diastolic Doppler parameters are indeterminate.   Echocardiogram February 2020  1. The left ventricle has low normal systolic function, with an ejection fraction of 50-55%. The cavity size was normal. There is mildly increased left ventricular wall thickness. Left ventricular diastolic Doppler parameters are consistent with  pseudonormalization.  2. The right ventricle has normal systolic function. The cavity was normal. There is no increase in right ventricular wall thickness.  3. Left atrial size was mildly dilated.  4. The tricuspid valve is normal in structure.  5. The aortic valve is tricuspid There is severe thickening and severe calcifcation of the aortic valve. Aortic valve regurgitation is moderate by color flow Doppler. There is moderate stenosis of the aortic valve, peak velocity 3.109m/s and mean  gradient 73mmHg.  6. The pulmonic valve was normal in structure.  7. There is mild dilatation at the level of the sinuses of Valsalva measuring 38 mm.  8. Right atrial pressure is estimated at 3 mmHg.  9. The mitral valve is normal in structure.  Patient Profile     73 y.o. female presenting with syncope and confirmed VT on her pacemaker, with a background of multiple cardiovascular problems including chronic type A aortic dissection, moderate to severe aortic stenosis (mean gradient 28 mmHg, peak aortic velocity 3.6 m/second in February 2020), sinus node dysfunction (atrially pacemaker dependent), with dual-chamber pacemaker (implanted 2007, generator change  2014), left bundle branch block, mild left ventricular systolic dysfunction (most recent LVEF 50%).  Assessment & Plan    1. VT:  - s/p PPM upgrade to ICD - mgt of this per EP but CXR ok today - hx nl cors so no ischemic eval indicated - Dr Lovena Le told her she could not drive x 6 months, she is upset about this  2. CMP:  - EF nl by echo 7/30 - peak/mean gradients 29.4/16  - volume status ok today  3. CHF, primarily diastolic:  - continue Lasix, Coreg, ARB - Ambulate and see how tolerated   4. AS:  - TAVR in the future, but not urgently needed - CT reports above - f/u w/ structural heart team  5. Ao dissection:  - By Dr. Servando Snare in 2000 - see CT report above, no sig change in residual intimal flap involving the aortic arch, left subclavian artery and the abdominal aorta.  There is no evidence of excessive aortic dilation that would require surgery in the near future.  - for TAVR, it is quite likely she will require transapical approach to avoid the dissection flaps.    6.  SSS/PM: "Atrially pacemaker dependent". - watch for CHB after TAVR w/ IVCD/LBBB at baseline - s/p new RV ICD  lead - CT of the chest appears to confirm that the tip of her right ventricular pacing lead is in the an epicardial position.  The lead is now 73 years old and revision is not indicated.    7. Anemia: Normocytic normochromic and with increased reticulocytes.  - +iron deficiency  - heme negative but would have her f/u with GI as she has not had a colonoscopy recently  - if concern for hemolysis, can check haptoglobin  8. Aflutter:  - none on most recent pacer check, and none in 3 years  - now off Xarelto, consider d/c rx unless more Afib detected  9. Obesity/DM:  - BMI now 34, much improved, she is working on wt loss, down 40 lbs so far - feels terrible after she takes DM meds, agrees to contact endocrinologist about this -  Most recent lipid profile showed excellent LDL cholesterol 70 and HDL  51.  10. HTN:  - SBP 140s-90s, DBP 34-90 on current rx - med schedule off due to hospitalization, suspect she runs more consistent at home.      For questions or updates, please contact [place] Please consult [email or web address] for contact info under        Signed, Rosaria Ferries, PA-C  06/29/2019, 9:33 AM    I have seen and examined the patient along with Rosaria Ferries, PA-C .  I have reviewed the chart, notes and new data.  I agree with PA's note.  Key new complaints: feeling better, remains emotionally shaken Key examination changes: ICD site looks healthy Key new findings / data: echo reviewed. The aortic valve gradients are underestimated due to poor Doppler alignment, but the AS remains non-severe. Fe deficient. A1c 5.5%, weight loss 40 lb, feels weak after [place].  PLAN: - Stop Xarelto - Fe supplements - Hemoccult and GI evaluation as an outpt. - Stop januvia and fenofibrate - reduce furosemide to 20 mg three times a week - wound check 8/13 at 12PM. F/U Dr. Servando Snare 8/13 1500h (we have already performed her CT angio of the aorta and canceled the CT that had been scheduled for 8/13). - virtual visit w me 4 weeks. - No driving x 6 months - she is not ready to wean off alprazolam at this time, following the current events. May need an adjustment in SSRI.   Sanda Klein, MD, [place] [phone] 06/29/2019, 10:19 AM

## 2019-06-29 NOTE — Progress Notes (Signed)
Repositioned PT and reminded not to turn on left side for 24hrs. Pt is compliant. Site dressing is dry and intact.

## 2019-07-02 ENCOUNTER — Telehealth: Payer: Self-pay | Admitting: *Deleted

## 2019-07-02 NOTE — Telephone Encounter (Signed)
LVM to call office.

## 2019-07-02 NOTE — Telephone Encounter (Signed)
Please see if patient is willing to set up a virtual visit with Dr. Etter Sjogren regarding her anxiety per Dr. Lovena Le. Thanks!

## 2019-07-02 NOTE — Telephone Encounter (Signed)
Dr Lovena Le sent me a note concerned because of her increased anxiety (completely understandable)-- -we can always do a virtual visit if she is afraid to come in .

## 2019-07-02 NOTE — Telephone Encounter (Signed)
Transition Care Management Follow-up Telephone Call   Date discharged? 06/29/19   How have you been since you were released from the hospital? Okay, but still feeling weak.   Do you understand why you were in the hospital? yes, I went into A-fib and passed out. I now have defibrillator.   Do you understand the discharge instructions? yes   Where were you discharged to? Home. Daughter is staying with her.   Items Reviewed:  Medications reviewed: "They took me off Januvia, fenofibrate, and xarelto."  Allergies reviewed: yes  Dietary changes reviewed: yes  Referrals reviewed: yes, I see Dr.Greg Lovena Le 07/15/19   Functional Questionnaire:   Activities of Daily Living (ADLs):   She states they are independent in the following: ambulation, bathing and hygiene, feeding, continence, grooming, toileting and dressing States they require assistance with the following: n/a   Any transportation issues/concerns?: no   Any patient concerns? no   Confirmed importance and date/time of follow-up visits scheduled yes   Pt declines setting up follow up appt with PCP at this time. States she wants to do her visit with Dr.Taylor first.  Confirmed with patient if condition begins to worsen call PCP or go to the ER.  Patient was given the office number and encouraged to call back with question or concerns.  : yes

## 2019-07-03 ENCOUNTER — Encounter: Payer: Self-pay | Admitting: Family Medicine

## 2019-07-03 ENCOUNTER — Ambulatory Visit (INDEPENDENT_AMBULATORY_CARE_PROVIDER_SITE_OTHER): Payer: Medicare HMO | Admitting: Family Medicine

## 2019-07-03 ENCOUNTER — Other Ambulatory Visit: Payer: Self-pay | Admitting: Internal Medicine

## 2019-07-03 ENCOUNTER — Other Ambulatory Visit: Payer: Self-pay

## 2019-07-03 DIAGNOSIS — I10 Essential (primary) hypertension: Secondary | ICD-10-CM | POA: Diagnosis not present

## 2019-07-03 DIAGNOSIS — E1165 Type 2 diabetes mellitus with hyperglycemia: Secondary | ICD-10-CM | POA: Diagnosis not present

## 2019-07-03 DIAGNOSIS — F419 Anxiety disorder, unspecified: Secondary | ICD-10-CM

## 2019-07-03 DIAGNOSIS — E785 Hyperlipidemia, unspecified: Secondary | ICD-10-CM | POA: Diagnosis not present

## 2019-07-03 DIAGNOSIS — I7101 Dissection of thoracic aorta: Secondary | ICD-10-CM

## 2019-07-03 DIAGNOSIS — I71019 Dissection of thoracic aorta, unspecified: Secondary | ICD-10-CM

## 2019-07-03 DIAGNOSIS — R55 Syncope and collapse: Secondary | ICD-10-CM | POA: Diagnosis not present

## 2019-07-03 DIAGNOSIS — E1151 Type 2 diabetes mellitus with diabetic peripheral angiopathy without gangrene: Secondary | ICD-10-CM | POA: Diagnosis not present

## 2019-07-03 DIAGNOSIS — IMO0002 Reserved for concepts with insufficient information to code with codable children: Secondary | ICD-10-CM

## 2019-07-03 NOTE — Assessment & Plan Note (Signed)
Well controlled, no changes to meds. Encouraged heart healthy diet such as the DASH diet and exercise as tolerated.  °

## 2019-07-03 NOTE — Assessment & Plan Note (Signed)
Tolerating statin, encouraged heart healthy diet, avoid trans fats, minimize simple carbs and saturated fats. Increase exercise as tolerated 

## 2019-07-03 NOTE — Assessment & Plan Note (Signed)
Stable  Still sees CTS

## 2019-07-03 NOTE — Assessment & Plan Note (Signed)
Pt has f/u cardiolgy  Pt has defib

## 2019-07-03 NOTE — Progress Notes (Signed)
Virtual Visit via Video Note  I connected with [name] on 07/03/19 at  4:00 PM EDT by a video enabled telemedicine application and verified that I am speaking with the correct person using two identifiers.  Location: Patient: home  Provider: office    I discussed the limitations of evaluation and management by telemedicine and the availability of in person appointments. The patient expressed understanding and agreed to proceed.  History of Present Illness: Pt is home c/o worsening anxiety since being in the hospital.     Observations/Objective: Vitals:   07/03/19 1557  Resp: 18  140/70 Pt is in NAD--- does get teary at times  Assessment and Plan: 1. Cardiac syncope Per cardiology  2. Anxiety Worsening due to cardiac event and hospitalization  con't celexa and xanax tid   3. DM (diabetes mellitus) type II uncontrolled, periph vascular disorder ([place]) Per endo Off all meds except jardiance due to cardiac benefit    4. Essential hypertension Well controlled, no changes to meds. Encouraged heart healthy diet such as the DASH diet and exercise as tolerated.    5. Hyperlipidemia LDL goal <100 Encouraged heart healthy diet, increase exercise, avoid trans fats, consider a krill oil cap daily  6. 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 The patient was provided an opportunity to ask questions and all were answered. The patient agreed with the plan and demonstrated an understanding of the instructions.   The patient was advised to call back or seek an in-person evaluation if the symptoms worsen or if the condition fails to improve as anticipated.  I provided 25 minutes of non-face-to-face time during this encounter.   [name], DO

## 2019-07-03 NOTE — Assessment & Plan Note (Signed)
Lab Results  Component Value Date   HGBA1C 5.5 06/28/2019   Pt is off all meds except jardiance for cardiac benefit  Pt has lost 40 lbs

## 2019-07-03 NOTE — Telephone Encounter (Signed)
LM for pt to call and schedule appt. °

## 2019-07-03 NOTE — Assessment & Plan Note (Signed)
Worsened with recent hospitalization con't xanax tid  and celex  F/u 3 months

## 2019-07-03 NOTE — Telephone Encounter (Signed)
Alprazolam refill.   Last OV: 02/28/2019 Last Fill: 06/06/2019 #90 and 0RF Pt sig: 1 tab tid prn UDS: 10/27/2017 Low risk

## 2019-07-05 ENCOUNTER — Telehealth: Payer: Self-pay | Admitting: Family Medicine

## 2019-07-05 ENCOUNTER — Telehealth: Payer: Self-pay | Admitting: *Deleted

## 2019-07-05 DIAGNOSIS — F419 Anxiety disorder, unspecified: Secondary | ICD-10-CM

## 2019-07-05 NOTE — Telephone Encounter (Signed)
[facility] would like to clarify the sig on celexa  Celexa 20mg   3 tablets daily  Last note in December said increase to 2 tablets.  Not sure where the 3 tabs came from.

## 2019-07-05 NOTE — Telephone Encounter (Signed)
Pt stated wanted to verify with provider since pharmacy informed pt that med citalopram (CELEXA) 20 MG tablet insurance will not cover it completely ( that it would be a higher cost) but pt does not mine in paying for her meds she just wants to know if provider is going to make a change on her meds citalopram to another one to please send it ASAP before she ends without her meds. Pt is ok with it being the same meds citalopram (she will pay for it) or not but does not want to end without her meds. Pt can not drive for 6 months and her next appt with cardiologist is a few wks out. Please advise pt at [facility] [phone].

## 2019-07-05 NOTE — Telephone Encounter (Signed)
We increased it to 2 a day--- who inc it to 3?

## 2019-07-05 NOTE — Telephone Encounter (Signed)
Ask her who increased it to 3 ----  2 is normally the highest I go

## 2019-07-05 NOTE — Telephone Encounter (Signed)
Please advise 

## 2019-07-06 NOTE — Telephone Encounter (Signed)
Yes--- we discussed it at her visit con't the 3 for now

## 2019-07-06 NOTE — Telephone Encounter (Signed)
She can cont celexa -- but with her recent hosp stay it should be decreased to 40 mg

## 2019-07-06 NOTE — Telephone Encounter (Signed)
It looks like it was changed at 11/16/2018 visit by you.

## 2019-07-06 NOTE — Telephone Encounter (Signed)
Patient states that she takes 3 tablets a day.  Do you want to continue?  Patient wanted to let you know that after her surgery the doctors took her off two medications Januvia (because her A1C was 5.5) and fenofibrate (because it raises her blood pressure).  She stated that she had fainted and had to have a defibrilator put in.    Patient was a little rude on the phone after asking questions about what surgery she had and why the doctors took her off.  She stated that Dr. Etter Sjogren knows about this.

## 2019-07-11 ENCOUNTER — Telehealth: Payer: Self-pay

## 2019-07-11 NOTE — Telephone Encounter (Signed)

## 2019-07-11 NOTE — Telephone Encounter (Signed)
Pt answered no to all covid-19 questions. I asked the pt to wear a mask. I let the pt know that we are reducing the number of people coming into the office and if she can come alone to please do so. The pt verbalized understanding.

## 2019-07-12 ENCOUNTER — Other Ambulatory Visit: Payer: Self-pay

## 2019-07-12 ENCOUNTER — Ambulatory Visit (INDEPENDENT_AMBULATORY_CARE_PROVIDER_SITE_OTHER): Payer: Medicare HMO | Admitting: *Deleted

## 2019-07-12 ENCOUNTER — Other Ambulatory Visit: Payer: Medicare Other

## 2019-07-12 ENCOUNTER — Ambulatory Visit: Payer: Medicare Other | Admitting: Cardiothoracic Surgery

## 2019-07-12 DIAGNOSIS — I472 Ventricular tachycardia, unspecified: Secondary | ICD-10-CM

## 2019-07-12 LAB — CUP PACEART INCLINIC DEVICE CHECK
Battery Remaining Longevity: 121 mo
Battery Voltage: 3.13 V
Brady Statistic AP VP Percent: 0.7 %
Brady Statistic AP VS Percent: 82.37 %
Brady Statistic AS VP Percent: 0.07 %
Brady Statistic AS VS Percent: 16.86 %
Brady Statistic RA Percent Paced: 82.7 %
Brady Statistic RV Percent Paced: 1 %
Date Time Interrogation Session: 20200813135909
HighPow Impedance: 54 Ohm
Implantable Lead Implant Date: 20071016
Implantable Lead Implant Date: 20071016
Implantable Lead Implant Date: 20200730
Implantable Lead Location: 753859
Implantable Lead Location: 753860
Implantable Lead Location: 753860
Implantable Lead Model: 4092
Implantable Lead Model: 5594
Implantable Lead Model: 6935
Implantable Pulse Generator Implant Date: 20200730
Lead Channel Impedance Value: 323 Ohm
Lead Channel Impedance Value: 456 Ohm
Lead Channel Impedance Value: 551 Ohm
Lead Channel Pacing Threshold Amplitude: 0.5 V
Lead Channel Pacing Threshold Amplitude: 0.75 V
Lead Channel Pacing Threshold Pulse Width: 0.4 ms
Lead Channel Pacing Threshold Pulse Width: 0.4 ms
Lead Channel Sensing Intrinsic Amplitude: 20 mV
Lead Channel Sensing Intrinsic Amplitude: 3.1 mV
Lead Channel Setting Pacing Amplitude: 1.5 V
Lead Channel Setting Pacing Amplitude: 3.5 V
Lead Channel Setting Pacing Pulse Width: 0.4 ms
Lead Channel Setting Sensing Sensitivity: 0.3 mV

## 2019-07-12 NOTE — Patient Instructions (Signed)
Set up remote monitor at home.

## 2019-07-12 NOTE — Progress Notes (Signed)
Wound check appointment. Steri-strips removed. Wound without redness or edema. Incision edges approximated, wound well healed. Normal device function. Thresholds, sensing, and impedances consistent with implant measurements. Device programmed at 3.5V for extra safety margin in RV lICD lead and at 1.5 in the chronic RA lead.until 3 month visit. Histogram distribution appropriate for patient and level of activity. No mode switches or ventricular arrhythmias noted. Patient educated about wound care, arm mobility, lifting restrictions, shock plan. ROV in 09/28/19 with Dr Lovena Le. Education done concerning remote monitoring. Next remote scheduled for 12/31/19.

## 2019-07-28 ENCOUNTER — Other Ambulatory Visit: Payer: Self-pay | Admitting: Family Medicine

## 2019-07-28 DIAGNOSIS — F419 Anxiety disorder, unspecified: Secondary | ICD-10-CM

## 2019-08-01 ENCOUNTER — Other Ambulatory Visit: Payer: Self-pay | Admitting: Family Medicine

## 2019-08-01 DIAGNOSIS — F419 Anxiety disorder, unspecified: Secondary | ICD-10-CM

## 2019-08-01 NOTE — Telephone Encounter (Signed)
Last alprazolam RX: 07/03/19, #90 Last OV:  07/03/19 Next OV:  None scheduled but due 10/03/19 UDS: 10/27/17 CSC: 10/27/17

## 2019-08-09 ENCOUNTER — Telehealth: Payer: Self-pay | Admitting: *Deleted

## 2019-08-09 NOTE — Telephone Encounter (Signed)

## 2019-08-10 ENCOUNTER — Telehealth: Payer: Self-pay | Admitting: *Deleted

## 2019-08-10 ENCOUNTER — Telehealth (INDEPENDENT_AMBULATORY_CARE_PROVIDER_SITE_OTHER): Payer: Medicare HMO | Admitting: Cardiovascular Disease

## 2019-08-10 VITALS — BP 113/57 | HR 52 | Ht 60.0 in | Wt 174.0 lb

## 2019-08-10 DIAGNOSIS — I71019 Dissection of thoracic aorta, unspecified: Secondary | ICD-10-CM

## 2019-08-10 DIAGNOSIS — I447 Left bundle-branch block, unspecified: Secondary | ICD-10-CM

## 2019-08-10 DIAGNOSIS — I495 Sick sinus syndrome: Secondary | ICD-10-CM

## 2019-08-10 DIAGNOSIS — Z95810 Presence of automatic (implantable) cardiac defibrillator: Secondary | ICD-10-CM | POA: Diagnosis not present

## 2019-08-10 DIAGNOSIS — I472 Ventricular tachycardia, unspecified: Secondary | ICD-10-CM

## 2019-08-10 DIAGNOSIS — F419 Anxiety disorder, unspecified: Secondary | ICD-10-CM

## 2019-08-10 DIAGNOSIS — Z95811 Presence of heart assist device: Secondary | ICD-10-CM

## 2019-08-10 DIAGNOSIS — I10 Essential (primary) hypertension: Secondary | ICD-10-CM

## 2019-08-10 DIAGNOSIS — I5032 Chronic diastolic (congestive) heart failure: Secondary | ICD-10-CM

## 2019-08-10 DIAGNOSIS — I7101 Dissection of thoracic aorta: Secondary | ICD-10-CM

## 2019-08-10 MED ORDER — LOSARTAN POTASSIUM 25 MG PO TABS: 12.5000 mg | ORAL_TABLET | Freq: Every day | ORAL | 3 refills | Status: DC

## 2019-08-10 NOTE — Patient Instructions (Signed)
Medication Instructions:  Decrease your Losartan to 12.5mg  daily. Take at bedtime.  If you need a refill on your cardiac medications before your next appointment, please call your pharmacy.   Lab work: NONE  Testing/Procedures: NONE  Follow-Up: At [facility], you and your health needs are our priority.  As part of our continuing mission to provide you with exceptional heart care, we have created designated Provider Care Teams.  These Care Teams include your primary Cardiologist (physician) and Advanced Practice Providers (APPs -  Physician Assistants and Nurse Practitioners) who all work together to provide you with the care you need, when you need it. You will need a follow up virtual appointment in 3 months.  Please call our office 2 months in advance to schedule this appointment.  You may see Sanda Klein, MD or one of the following Advanced Practice Providers on your designated Care Team: Almyra Deforest, [place] Fabian Sharp, [place]  Please do a manual download today.

## 2019-08-10 NOTE — Telephone Encounter (Signed)
The patient has been called about the virtual appointment today with Dr. Sallyanne Kuster. Instructions provided. The AVS will be mailed. The patient verbalized their understanding.  She has completed her download as advised

## 2019-08-10 NOTE — Progress Notes (Signed)
Virtual Visit via Telephone Note   This visit type was conducted due to national recommendations for restrictions regarding the COVID-19 Pandemic (e.g. social distancing) in an effort to limit this patient's exposure and mitigate transmission in our community.  Due to her co-morbid illnesses, this patient is at least at moderate risk for complications without adequate follow up.  This format is felt to be most appropriate for this patient at this time.  The patient did not have access to video technology/had technical difficulties with video requiring transitioning to audio format only (telephone).  All issues noted in this document were discussed and addressed.  No physical exam could be performed with this format.  Please refer to the patient's chart for her  consent to telehealth for [facility].   Date:  08/10/2019   ID:  [name], DOB [birth year], MRN [id number]  Patient Location: Home Provider Location: Office  PCP:  Carollee Herter, Alferd Apa, DO  Cardiologist:  Sanda Klein, MD  Electrophysiologist:  None   Evaluation Performed:  Follow-Up Visit  Chief Complaint:  ICD beeping  History of Present Illness:    [name] is a 73 y.o. female with history of recent implantable defibrillator for sustained monomorphic ventricular tachycardia complicated by syncope in late July.  She has noticed that her defibrillator has been beeping once every 24 hours, around 2 AM, for roughly the last 2 weeks.  She has not had any episodes of loss of consciousness that she is aware of and has not felt a defibrillator shock.  Unfortunately, her home monitor, which she has had ever since her previous dual-chamber permanent pacemaker, has been unsuccessful at downloading information.    [name] has a previous proximal aortic dissection (ascending aorta repair and aortic valve resuspension by Dr. 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  Coronary CT angiography performed on July 02, 2019 shows no evidence of coronary artery disease.  She does have relatively shallow coronary arteries and it is unclear how suitable the anatomy is for TAVR.  She has a history of sinus node dysfunction (("atrially pacemaker dependent") with a dual-chamber pacemaker since 2007 (changed out for a Medtronic defibrillator, new ICD lead on July 30 by Dr. Cristopher Peru).  She also has a major intraventricular conduction delay (atypical left bundle branch block).  Her pacemaker has detected asymptomatic atrial flutter in 2017 without any recurrence since.  She also has moderate aortic stenosis and mild to moderate aortic valve insufficiency with anticipated future need for TAVR (the severity of aortic stenosis was underestimated on the most recent echo from July, but echo in February 2020 showed a mean gradient of 28 mmHg and a peak aortic velocity of 3.56 m/second).    She has a history of predominantly  diastolic heart failure that has been well compensated on a low dose of loop diuretic.  LVEF is minimally depressed (EF 50%) at least in part due to pacing induced dyssynchrony.  She has not had chest discomfort and has not been aware of syncope.  She denies orthopnea or PND does not have exertional dyspnea.  She does complain of lack of energy and motivation and just overall fatigue.  She believes this worsened after we added losartan to her medication list.  She has not had leg edema or claudication.  She has been very anxious since her hospitalization and defibrillator implantation and was just starting to get over the anxiety.  She feels much better after taking alprazolam and has noticed that she has required lower doses recently.  The patient does not have symptoms concerning for COVID-19 infection (fever, chills, cough, or new shortness of breath).    Past Medical History:  Diagnosis Date   Acute thoracic aortic dissection [facility]) 2008    emergency surgery - [place]   Anxiety    Cancer [facility])    skin   Diabetes mellitus    Diverticulosis    GERD (gastroesophageal reflux disease)    Hemorrhoids    Hyperlipidemia    Hypertension    Kidney stones    Sinus node dysfunction ([place])    hx of PPM   Past Surgical History:  Procedure Laterality Date   ABDOMINAL HYSTERECTOMY  1984   APPENDECTOMY     EYE SURGERY  2014   Rght eye   ICD IMPLANT N/A 06/28/2019   Procedure: PPM upgrade to ICD;  Surgeon: Evans Lance, MD;  Location: [facility];  Service: Cardiovascular;  Laterality: N/A;   PACEMAKER GENERATOR CHANGE  2014   Medtronic adapta   PACEMAKER GENERATOR CHANGE N/A 08/29/2013   Procedure: PACEMAKER GENERATOR CHANGE;  Surgeon: Sanda Klein, MD;  Location: [place] C[facility];  Service: Cardiovascular;  Laterality: N/A;   PACEMAKER INSERTION  08/2006   dual chamber Medtronic EnRhythm; r/t sinus node dysfunction    REPAIR OF ACUTE ASCENDING THORACIC AORTIC DISSECTION  2008   Dr. Servando Snare   THORACIC AORTIC ANEURYSM REPAIR  2000   type III   TRANSTHORACIC ECHOCARDIOGRAM  09/20/2012   EF=>55% with mild conc LVH; LA mod dilated; RA mildly dilated; mild MR/TR/AR     Current Meds  Medication Sig   ALPRAZolam (XANAX) 0.5 MG tablet TAKE 1 TABLET (0.5 MG TOTAL) BY MOUTH 3 (THREE) TIMES DAILY AS NEEDED FOR ANXIETY.   carvedilol (COREG) 25 MG tablet TAKE 1 TABLET BY MOUTH TWICE A DAY WITH A MEAL (Patient taking differently: Take 25 mg by mouth 2 (two) times daily with a meal. )   cholecalciferol (VITAMIN D) 1000 UNITS tablet Take 1,000 Units by mouth daily.   citalopram (CELEXA) 20 MG tablet TAKE 3 TABLETS BY MOUTH EVERY DAY   empagliflozin (JARDIANCE) 10 MG TABS tablet Take 10 mg by mouth daily.   furosemide (LASIX) 20 MG tablet Take 1 tablet (20 mg total) by mouth daily. (Patient taking differently: Take 20 mg by mouth daily. Monday, Wednesday, and Friday)   hyoscyamine (LEVSIN SL) 0.125 MG SL  tablet TAKE 1 TABLET BY MOUTH TWICE A DAY AS NEEDED FOR CRAMPING OR DIARRHEA OR LOOSE STOOLS   losartan (COZAAR) 25 MG tablet Take 0.5 tablets (12.5 mg total) by mouth at bedtime.   Omega-3 Fatty Acids (FISH OIL) 1000 MG CAPS Take 1 capsule by mouth daily.    Probiotic Product (PRO-BIOTIC BLEND PO) Take 1 tablet by mouth daily.    rosuvastatin (CRESTOR) 20 MG tablet TAKE 1 TABLET BY MOUTH EVERY DAY (Patient taking differently: Take 20 mg by mouth at bedtime. )   [DISCONTINUED] losartan (COZAAR) 25 MG tablet Take 1 tablet (25 mg total) by mouth daily. (Patient taking differently: Take 12.5 mg by mouth daily. At bedtime)   Current Facility-Administered Medications for the 08/10/19 encounter (Telemedicine) with Miro Balderson, Dani Gobble, MD  Medication   diclofenac sodium (VOLTAREN) 1 % transdermal gel 2 g     Allergies:   Oxycodone, Vicodin [hydrocodone-acetaminophen], Lidocaine-aloe vera, Latex, Neomycin-bacitracin zn-polymyx, and Sulfonamide derivatives   Social History   Tobacco  Use   Smoking status: Former Smoker    Quit date: 09/04/1993    Years since quitting: 25.9   Smokeless tobacco: Never Used  Substance Use Topics   Alcohol use: No   Drug use: No     Family Hx: The patient's family history includes Heart attack in her father and maternal grandmother; Kidney disease in her mother; Valvular heart disease in her son. There is no history of Cancer.  ROS:   Please see the history of present illness.     All other systems reviewed and are negative.   Prior CV studies:   The following studies were reviewed today:  Echocardiogram June 28, 2019, defibrillator check July 12, 2019  Labs/Other Tests and Data Reviewed:    EKG:  An ECG dated 06/27/2019 was personally reviewed today and demonstrated:  Atrial paced rhythm with prolonged AV conduction and left bundle branch block  Recent Labs: 06/28/2019: Magnesium 2.0; TSH 0.893 06/29/2019: ALT 11; BUN 18; Creatinine, Ser 1.08;  Hemoglobin 9.3; Platelets 120; Potassium 4.5; Sodium 133   Recent Lipid Panel Lab Results  Component Value Date/Time   CHOL 143 06/28/2017 02:46 PM   TRIG 107.0 06/28/2017 02:46 PM   HDL 51.40 06/28/2017 02:46 PM   CHOLHDL 3 06/28/2017 02:46 PM   LDLCALC 70 06/28/2017 02:46 PM   LDLDIRECT 217.7 09/13/2011 03:35 PM    Wt Readings from Last 3 Encounters:  08/10/19 174 lb (78.9 kg)  07/03/19 171 lb 12.8 oz (77.9 kg)  06/29/19 175 lb 12.8 oz (79.7 kg)     Objective:    Vital Signs:  BP (!) 113/57    Pulse (!) 52    Ht 5' (1.524 m)    Wt 174 lb (78.9 kg)    BMI 33.98 kg/m    VITAL SIGNS:  reviewed Unable to examine  ASSESSMENT & PLAN:    1. VT: After we completed our visit today, Medtronic tech services was able to perform a download of her device.  We were unable to retrieve this via the website due to technical issues, but Adalis had an episode of sustained ventricular tachycardia on August 24 that was successfully aborted with defibrillator shock.  I called her back to discuss this with her in the afternoon.  She had no knowledge of that event and does not recall experiencing a shock or having syncope or chest discomfort.  Most recent labs showed normal electrolytes.  She does not have any coronary disease and has minimally depressed left ventricular systolic function.  She will require antiarrhythmic therapy.  We will try to get her to see Dr. Lovena Le as soon as possible next week.  I advised Augustine that our threshold for hospitalization is very low should she have any new events or change in symptomatology over the weekend.  She should have no hesitation in going to the emergency room and being admitted. 2. CHF: As much as I can ascertain via phone conversation she is euvolemic and has NYHA functional class I.  She does complain of fatigue ever since we added losartan.  We will cut the dose of losartan to 12.5 mg daily.  Explained that we want to keep her on as high dose of beta-blocker as  possible. 3. AS: Moderate to severe, not yet requiring valve replacement.  She is not a good candidate for surgical aortic valve replacement due to her previous sternotomy and aortic dissection.  Unfortunately she may be a suboptimal candidate for TAVR due to the coronary anatomy  with a very shallow coronary ostia.  She would not be a candidate for conventional transfemoral approach due to the aortic dissection. 4. Ao dissection: Recently reevaluated by CT.  There are no areas of aneurysm that would require surgery in the near future. 5. SSS: She is known to be "atrially dependent" and also has evidence of prolonged AV conduction time and intraventricular conduction delay with left bundle branch block.  Adding an antiarrhythmic such as amiodarone would likely lead to high frequency of ventricular pacing, but her LVEF is almost normal. 6. ICD: Unable to directly evaluate device function, but according to [employer] tech services the device appropriately detected and shocked for ventricular tachycardia on August 24. 7. HTN: She tends to run a rather low diastolic blood pressure.  I wonder if this is part of her complaints of fatigue.  We will reduce the dose of losartan.  Want to keep her in as much beta-blocker as possible for the ventricular tachycardia. 8. Anxiety: Is not surprising that she is having a hard time adjusting to her new health problems.  She's recently had to go through the loss of her husband and is struggling now with her loss of autonomy since she cannot drive, although this of course compounded by all the stressors related to the coronavirus pandemic.  COVID-19 Education: The signs and symptoms of COVID-19 were discussed with the patient and how to seek care for testing (follow up with PCP or arrange E-visit).  The importance of social distancing was discussed today.  Time:   Today, I have spent 35 minutes with the patient with telehealth technology discussing the above problems.      Medication Adjustments/Labs and Tests Ordered: Current medicines are reviewed at length with the patient today.  Concerns regarding medicines are outlined above.   Tests Ordered: No orders of the defined types were placed in this encounter.   Medication Changes: Meds ordered this encounter  Medications   losartan (COZAAR) 25 MG tablet    Sig: Take 0.5 tablets (12.5 mg total) by mouth at bedtime.    Dispense:  90 tablet    Refill:  3    Change in dose. D/c 25mg  Losartan order.    Follow Up:  In Person Dr. Cristopher Peru, Wednesday, August 15, 2019  Signed, Sanda Klein, MD  08/10/2019 4:04 PM    [place]

## 2019-08-14 ENCOUNTER — Other Ambulatory Visit: Payer: Self-pay | Admitting: Cardiovascular Disease

## 2019-08-14 ENCOUNTER — Other Ambulatory Visit: Payer: Self-pay | Admitting: Family Medicine

## 2019-08-14 DIAGNOSIS — I10 Essential (primary) hypertension: Secondary | ICD-10-CM

## 2019-08-15 ENCOUNTER — Ambulatory Visit (INDEPENDENT_AMBULATORY_CARE_PROVIDER_SITE_OTHER): Payer: Medicare HMO | Admitting: Internal Medicine

## 2019-08-15 ENCOUNTER — Encounter: Payer: Self-pay | Admitting: Internal Medicine

## 2019-08-15 ENCOUNTER — Other Ambulatory Visit: Payer: Self-pay

## 2019-08-15 VITALS — BP 118/58 | HR 55 | Ht 60.0 in | Wt 174.0 lb

## 2019-08-15 DIAGNOSIS — I495 Sick sinus syndrome: Secondary | ICD-10-CM | POA: Diagnosis not present

## 2019-08-15 DIAGNOSIS — I472 Ventricular tachycardia, unspecified: Secondary | ICD-10-CM

## 2019-08-15 DIAGNOSIS — Z95810 Presence of automatic (implantable) cardiac defibrillator: Secondary | ICD-10-CM | POA: Insufficient documentation

## 2019-08-15 NOTE — Progress Notes (Signed)
HPI [name] returns today for followup of her VT. She is a pleasant 73 yo woman with a h/o VT, who underwent ICD upgrade from her PPM several weeks ago. She has a prior repaired ascending aortic dissection. She has at least moderate AS by echo. Mean gradient is 28 mmHg. She has only mild LV dysfunction.  Allergies  Allergen Reactions  . Oxycodone Itching  . Vicodin [Hydrocodone-Acetaminophen] Itching  . Lidocaine-Aloe Vera Other (See Comments)    Felt like bees stinging her  . Latex Rash    Itching/rash   . Neomycin-Bacitracin Zn-Polymyx Other (See Comments)    Red , swollen eye  . Sulfonamide Derivatives Other (See Comments)    Just had reaction when taking during pregnancy- last child birth 38     Current Outpatient Medications  Medication Sig Dispense Refill  . ALPRAZolam (XANAX) 0.5 MG tablet TAKE 1 TABLET (0.5 MG TOTAL) BY MOUTH 3 (THREE) TIMES DAILY AS NEEDED FOR ANXIETY. 90 tablet 0  . carvedilol (COREG) 25 MG tablet TAKE 1 TABLET BY MOUTH TWICE A DAY WITH A MEAL 180 tablet 1  . cholecalciferol (VITAMIN D) 1000 UNITS tablet Take 1,000 Units by mouth daily.    . citalopram (CELEXA) 20 MG tablet TAKE 3 TABLETS BY MOUTH EVERY DAY 270 tablet 3  . empagliflozin (JARDIANCE) 10 MG TABS tablet Take 10 mg by mouth daily. 30 tablet 3  . fluticasone (FLONASE) 50 MCG/ACT nasal spray USE 2 SPRAYS IN EACH NOSTIRL EVERY DAY 16 g 1  . furosemide (LASIX) 20 MG tablet Take 1 tablet (20 mg total) by mouth daily. (Patient taking differently: Take 20 mg by mouth daily. Monday, Wednesday, and Friday) 30 tablet 0  . hyoscyamine (LEVSIN SL) 0.125 MG SL tablet TAKE 1 TABLET BY MOUTH TWICE A DAY AS NEEDED FOR CRAMPING OR DIARRHEA OR LOOSE STOOLS 60 tablet 0  . losartan (COZAAR) 25 MG tablet Take 0.5 tablets (12.5 mg total) by mouth at bedtime. 90 tablet 3  . Omega-3 Fatty Acids (FISH OIL) 1000 MG CAPS Take 1 capsule by mouth daily.     Vladimir Faster Glycol-Propyl Glycol (SYSTANE OP) Apply 1  drop to eye 4 (four) times daily - after meals and at bedtime.    . Probiotic Product (PRO-BIOTIC BLEND PO) Take 1 tablet by mouth daily.     . rosuvastatin (CRESTOR) 20 MG tablet TAKE 1 TABLET BY MOUTH EVERY DAY 90 tablet 1   Current Facility-Administered Medications  Medication Dose Route Frequency Provider Last Rate Last Dose  . diclofenac sodium (VOLTAREN) 1 % transdermal gel 2 g  2 g Topical QID [name], DO         Past Medical History:  Diagnosis Date  . Acute thoracic aortic dissection [facility]) 2008   emergency surgery - [facility]  . Anxiety   . Cancer ([place])    skin  . Diabetes mellitus   . Diverticulosis   . GERD (gastroesophageal reflux disease)   . Hemorrhoids   . Hyperlipidemia   . Hypertension   . Kidney stones   . Sinus node dysfunction (HCC)    hx of PPM    ROS:   All systems reviewed and negative except as noted in the HPI.   Past Surgical History:  Procedure Laterality Date  . ABDOMINAL HYSTERECTOMY  1984  . APPENDECTOMY    . EYE SURGERY  2014   Rght eye  . ICD IMPLANT N/A 06/28/2019   Procedure: PPM upgrade  to ICD;  Surgeon: Evans Lance, MD;  Location: [place] CV LAB;  Service: Cardiovascular;  Laterality: N/A;  . PACEMAKER GENERATOR CHANGE  2014   Medtronic adapta  . PACEMAKER GENERATOR CHANGE N/A 08/29/2013   Procedure: PACEMAKER GENERATOR CHANGE;  Surgeon: Sanda Klein, MD;  Location: [place] CATH LAB;  Service: Cardiovascular;  Laterality: N/A;  . PACEMAKER INSERTION  08/2006   dual chamber Medtronic EnRhythm; r/t sinus node dysfunction   . REPAIR OF ACUTE ASCENDING THORACIC AORTIC DISSECTION  2008   Dr. Servando Snare  . THORACIC AORTIC ANEURYSM REPAIR  2000   type III  . TRANSTHORACIC ECHOCARDIOGRAM  09/20/2012   EF=>55% with mild conc LVH; LA mod dilated; RA mildly dilated; mild MR/TR/AR     Family History  Problem Relation Age of Onset  . Kidney disease Mother   . Heart attack Father   . Heart attack Maternal Grandmother   .  Valvular heart disease Son        valve replacement at 94  . Cancer Neg Hx      Social History   Socioeconomic History  . Marital status: Widowed    Spouse name: Not on file  . Number of children: 2  . Years of education: Not on file  . Highest education level: Not on file  Occupational History    Employer: RETIRED  Social Needs  . Financial resource strain: Not on file  . Food insecurity    Worry: Not on file    Inability: Not on file  . Transportation needs    Medical: Not on file    Non-medical: Not on file  Tobacco Use  . Smoking status: Former Smoker    Quit date: 09/04/1993    Years since quitting: 25.9  . Smokeless tobacco: Never Used  Substance and Sexual Activity  . Alcohol use: No  . Drug use: No  . Sexual activity: Not Currently  Lifestyle  . Physical activity    Days per week: Not on file    Minutes per session: Not on file  . Stress: Not on file  Relationships  . Social Herbalist on phone: Not on file    Gets together: Not on file    Attends religious service: Not on file    Active member of club or organization: Not on file    Attends meetings of clubs or organizations: Not on file    Relationship status: Not on file  . Intimate partner violence    Fear of current or ex partner: Not on file    Emotionally abused: Not on file    Physically abused: Not on file    Forced sexual activity: Not on file  Other Topics Concern  . Not on file  Social History Narrative   Lives at home and husband was just moved to [place]     BP (!) 118/58   Pulse (!) 55   Ht 5' (1.524 m)   Wt 174 lb (78.9 kg)   SpO2 (!) 89%   BMI 33.98 kg/m   Physical Exam:  Well appearing NAD HEENT: Unremarkable Neck:  No JVD, no thyromegally Lymphatics:  No adenopathy Back:  No CVA tenderness Lungs:  Clear with no wheezes HEART:  Regular rate rhythm, no murmurs, no rubs, no clicks Abd:  soft, positive bowel sounds, no organomegally, no rebound, no  guarding Ext:  2 plus pulses, no edema, no cyanosis, no clubbing Skin:  No rashes no nodules Neuro:  CN II through XII intact, motor grossly intact  EKG - NSR with atrial pacing  DEVICE  Normal device function.  See PaceArt for details.   Assess/Plan: 1. VT/VF - the patient has had recurrent episodes. Review of her episodes demonstrate without exception, NSR with PVC's followed by AAI-DDD pacing resulting in a long pause resulting in PMVT. Several stopped on there own. One required ICD shock when it degenerated into VF. 2. ICD - her medtronic DDD ICD has been reprogrammed.  3. Sinus node dysfunction - she is atrial pacing 65%.   Mikle Bosworth.D.

## 2019-08-15 NOTE — Patient Instructions (Addendum)
Medication Instructions:  Your physician recommends that you continue on your current medications as directed. Please refer to the Current Medication list given to you today.  Labwork: None ordered.  Testing/Procedures: None ordered.  Follow-Up: Your physician wants you to follow-up in: 6 months with Dr. Lovena Le.  You will receive a reminder letter in the mail two months in advance. If you don't receive a letter, please call our office to schedule the follow-up appointment.  Remote monitoring is used to monitor your ICD from home. This monitoring reduces the number of office visits required to check your device to one time per year. It allows [name] to keep an eye on the functioning of your device to ensure it is working properly. You are scheduled for a device check from home on 11/14/2019. You may send your transmission at any time that day. If you have a wireless device, the transmission will be sent automatically. After your physician reviews your transmission, you will receive a postcard with your next transmission date.  Any Other Special Instructions Will Be Listed Below (If Applicable).  If you need a refill on your cardiac medications before your next appointment, please call your pharmacy.

## 2019-08-16 ENCOUNTER — Telehealth: Payer: Self-pay | Admitting: Cardiovascular Disease

## 2019-08-16 ENCOUNTER — Other Ambulatory Visit: Payer: Self-pay

## 2019-08-16 MED ORDER — FUROSEMIDE 20 MG PO TABS: 20.0000 mg | ORAL_TABLET | Freq: Every day | ORAL | 3 refills | Status: DC

## 2019-08-16 NOTE — Telephone Encounter (Signed)
°*  STAT* If patient is at the pharmacy, call can be transferred to refill team.   1. Which medications need to be refilled? (please list name of each medication and dose if known) furosemide (LASIX) 20 MG tablet  2. Which pharmacy/location (including street and city if local pharmacy) is medication to be sent to? [id number] - [place] [place] - [address]  3. Do they need a 30 day or 90 day supply? 90 day  Patient only has 2 days left.  She states she takes it everyday.

## 2019-08-27 ENCOUNTER — Other Ambulatory Visit: Payer: Self-pay | Admitting: Family Medicine

## 2019-08-27 ENCOUNTER — Telehealth: Payer: Self-pay | Admitting: Family Medicine

## 2019-08-27 DIAGNOSIS — K5792 Diverticulitis of intestine, part unspecified, without perforation or abscess without bleeding: Secondary | ICD-10-CM

## 2019-08-27 MED ORDER — AMOXICILLIN-POT CLAVULANATE 875-125 MG PO TABS: 1.0000 | ORAL_TABLET | Freq: Two times a day (BID) | ORAL | 0 refills | Status: DC

## 2019-08-27 NOTE — Telephone Encounter (Signed)
I sent in augmentin-- but if her symptoms do not improve she will need office visit

## 2019-08-27 NOTE — Telephone Encounter (Signed)
Please advise 

## 2019-08-27 NOTE — Telephone Encounter (Signed)
Pt states she is having an episode of diverticulitis.  Pt states Dr Sherrine Maples is aware of her issue.  Pt hopes to get this medicine asap.  If pt needs a virtaul appt for this, please call to schedule, as this med has not been prescribed since 2018.

## 2019-08-27 NOTE — Telephone Encounter (Signed)
Patient requesting hyoscyamine (LEVSIN SL) 0.125 MG SL tablet , informed patient please allow 48 to 72 hour turn around time  [id number] [address], [place] (Phone) [phone] (Fax)

## 2019-08-28 NOTE — Telephone Encounter (Signed)
This encounter was created in error - please disregard.

## 2019-08-30 ENCOUNTER — Ambulatory Visit (INDEPENDENT_AMBULATORY_CARE_PROVIDER_SITE_OTHER): Payer: Medicare HMO | Admitting: Internal Medicine

## 2019-08-30 DIAGNOSIS — R197 Diarrhea, unspecified: Secondary | ICD-10-CM

## 2019-08-30 DIAGNOSIS — K5792 Diverticulitis of intestine, part unspecified, without perforation or abscess without bleeding: Secondary | ICD-10-CM

## 2019-08-30 MED ORDER — HYOSCYAMINE SULFATE 0.125 MG SL SUBL: 0.1250 mg | SUBLINGUAL_TABLET | Freq: Four times a day (QID) | SUBLINGUAL | 0 refills | Status: DC | PRN

## 2019-08-30 NOTE — Progress Notes (Signed)
Subjective:    Patient ID: [name], female    DOB: 30-Apr-1946, 73 y.o.   MRN: [id number]  DOS:  08/30/2019 Type of visit - description: Virtual Visit via Video Note  I connected with@   by a video enabled telemedicine application and verified that I am speaking with the correct person using two identifiers.   THIS ENCOUNTER IS A VIRTUAL VISIT DUE TO COVID-19 - PATIENT WAS NOT SEEN IN THE OFFICE. PATIENT HAS CONSENTED TO VIRTUAL VISIT / TELEMEDICINE VISIT   Location of patient: home  Location of provider: office  I discussed the limitations of evaluation and management by telemedicine and the availability of in person appointments. The patient expressed understanding and agreed to proceed.  History of Present Illness: Acute The patient reported she had an episode of diverticulitis. PCP send Augmentin to her pharmacy. At the time she was already  experiencing mild diarrhea and left-sided abdominal pain.  She is calling today because the diarrhea is much more intense and she requests hyoscyamine prescription which historically is a medication that definitely help with diarrhea. Abdominal pain has decreased.  Review of Systems Denies fever chills No blood in the stools Stools are watery, she has been able to drink plenty of fluids No nausea or vomiting   Past Medical History:  Diagnosis Date  . Acute thoracic aortic dissection [facility]) 2008   emergency surgery - [facility]  . Anxiety   . Cancer ([place])    skin  . Diabetes mellitus   . Diverticulosis   . GERD (gastroesophageal reflux disease)   . Hemorrhoids   . Hyperlipidemia   . Hypertension   . Kidney stones   . Sinus node dysfunction (HCC)    hx of PPM    Past Surgical History:  Procedure Laterality Date  . ABDOMINAL HYSTERECTOMY  1984  . APPENDECTOMY    . EYE SURGERY  2014   Rght eye  . ICD IMPLANT N/A 06/28/2019   Procedure: PPM upgrade to ICD;  Surgeon: Evans Lance, MD;  Location: [place] CV LAB;   Service: Cardiovascular;  Laterality: N/A;  . PACEMAKER GENERATOR CHANGE  2014   Medtronic adapta  . PACEMAKER GENERATOR CHANGE N/A 08/29/2013   Procedure: PACEMAKER GENERATOR CHANGE;  Surgeon: Sanda Klein, MD;  Location: [place] CATH LAB;  Service: Cardiovascular;  Laterality: N/A;  . PACEMAKER INSERTION  08/2006   dual chamber Medtronic EnRhythm; r/t sinus node dysfunction   . REPAIR OF ACUTE ASCENDING THORACIC AORTIC DISSECTION  2008   Dr. Servando Snare  . THORACIC AORTIC ANEURYSM REPAIR  2000   type III  . TRANSTHORACIC ECHOCARDIOGRAM  09/20/2012   EF=>55% with mild conc LVH; LA mod dilated; RA mildly dilated; mild MR/TR/AR    Social History   Socioeconomic History  . Marital status: Widowed    Spouse name: Not on file  . Number of children: 2  . Years of education: Not on file  . Highest education level: Not on file  Occupational History    Employer: RETIRED  Social Needs  . Financial resource strain: Not on file  . Food insecurity    Worry: Not on file    Inability: Not on file  . Transportation needs    Medical: Not on file    Non-medical: Not on file  Tobacco Use  . Smoking status: Former Smoker    Quit date: 09/04/1993    Years since quitting: 26.0  . Smokeless tobacco: Never Used  Substance and Sexual Activity  .  Alcohol use: No  . Drug use: No  . Sexual activity: Not Currently  Lifestyle  . Physical activity    Days per week: Not on file    Minutes per session: Not on file  . Stress: Not on file  Relationships  . Social Herbalist on phone: Not on file    Gets together: Not on file    Attends religious service: Not on file    Active member of club or organization: Not on file    Attends meetings of clubs or organizations: Not on file    Relationship status: Not on file  . Intimate partner violence    Fear of current or ex partner: Not on file    Emotionally abused: Not on file    Physically abused: Not on file    Forced sexual activity: Not on  file  Other Topics Concern  . Not on file  Social History Narrative   Lives at home and husband was just moved to [place]      Allergies as of 08/30/2019      Reactions   Oxycodone Itching   Vicodin [hydrocodone-acetaminophen] Itching   Lidocaine-aloe Vera Other (See Comments)   Felt like bees stinging her   Latex Rash   Itching/rash    Neomycin-bacitracin Zn-polymyx Other (See Comments)   Red , swollen eye   Sulfonamide Derivatives Other (See Comments)   Just had reaction when taking during pregnancy- last child birth 30      Medication List       Accurate as of August 30, 2019 11:59 PM. If you have any questions, ask your nurse or doctor.        ALPRAZolam 0.5 MG tablet Commonly known as: XANAX TAKE 1 TABLET (0.5 MG TOTAL) BY MOUTH 3 (THREE) TIMES DAILY AS NEEDED FOR ANXIETY.   amoxicillin-clavulanate 875-125 MG tablet Commonly known as: Augmentin Take 1 tablet by mouth 2 (two) times daily.   carvedilol 25 MG tablet Commonly known as: COREG TAKE 1 TABLET BY MOUTH TWICE A DAY WITH A MEAL   cholecalciferol 1000 units tablet Commonly known as: VITAMIN D Take 1,000 Units by mouth daily.   citalopram 20 MG tablet Commonly known as: CELEXA TAKE 3 TABLETS BY MOUTH EVERY DAY   empagliflozin 10 MG Tabs tablet Commonly known as: JARDIANCE Take 10 mg by mouth daily.   Fish Oil 1000 MG Caps Take 1 capsule by mouth daily.   fluticasone 50 MCG/ACT nasal spray Commonly known as: FLONASE USE 2 SPRAYS IN EACH NOSTIRL EVERY DAY   furosemide 20 MG tablet Commonly known as: LASIX Take 1 tablet (20 mg total) by mouth daily.   hyoscyamine 0.125 MG SL tablet Commonly known as: LEVSIN SL Take 1 tablet (0.125 mg total) by mouth every 6 (six) hours as needed. What changed: See the new instructions.   losartan 25 MG tablet Commonly known as: COZAAR Take 0.5 tablets (12.5 mg total) by mouth at bedtime.   PRO-BIOTIC BLEND PO Take 1 tablet by mouth daily.    rosuvastatin 20 MG tablet Commonly known as: CRESTOR TAKE 1 TABLET BY MOUTH EVERY DAY   SYSTANE OP Apply 1 drop to eye 4 (four) times daily - after meals and at bedtime.           Objective:   Physical Exam There were no vitals taken for this visit. This is a virtual video visit, alert oriented x3, in no distress    Assessment  73 year old female, PMH includes diabetes, VT/VF, ICD upgrade several weeks ago, history of repair ascending aortic dissection,   presents with  Diverticulitis and diarrhea Patient had left-sided abdominal pain and diarrhea, was prescribed Augmentin for presumed diverticulitis, diarrhea is much worse, reports that typically hyoscyamine helps and request a prescription. I asked her to come to the office because I will be more certain about her status by doing an abdominal exam/labs, she strongly declined, she is unable to come. Usually  I would replace Augmentin for Cipro/Flagyl but with here history of aortic disease Cipro is not ideal. Plan: Augmentin for 7 days then stop. Good hydration Call anytime if severe symptoms, blood in the stools, fever chills Hyoscyamine as needed, RF sent

## 2019-09-01 ENCOUNTER — Other Ambulatory Visit: Payer: Self-pay | Admitting: Family Medicine

## 2019-09-01 DIAGNOSIS — F419 Anxiety disorder, unspecified: Secondary | ICD-10-CM

## 2019-09-01 DIAGNOSIS — I10 Essential (primary) hypertension: Secondary | ICD-10-CM

## 2019-09-03 ENCOUNTER — Telehealth: Payer: Self-pay | Admitting: Cardiovascular Disease

## 2019-09-03 DIAGNOSIS — I10 Essential (primary) hypertension: Secondary | ICD-10-CM

## 2019-09-03 DIAGNOSIS — I5032 Chronic diastolic (congestive) heart failure: Secondary | ICD-10-CM

## 2019-09-03 MED ORDER — POTASSIUM CHLORIDE ER 20 MEQ PO TBCR: 20.0000 meq | EXTENDED_RELEASE_TABLET | Freq: Every day | ORAL | 11 refills | Status: DC

## 2019-09-03 MED ORDER — FUROSEMIDE 20 MG PO TABS: 40.0000 mg | ORAL_TABLET | Freq: Every day | ORAL | 3 refills | Status: DC

## 2019-09-03 NOTE — Telephone Encounter (Signed)
The patient has been called and made aware of Dr. Victorino December recommendations. Potassium 20 mEq has been sent in for her and a BMET has been ordered.

## 2019-09-03 NOTE — Telephone Encounter (Signed)
°  Pt c/o medication issue:  1. Name of Medication: furosemide (LASIX) 20 MG tablet  2. How are you currently taking this medication (dosage and times per day)? 40 mg daily  3. Are you having a reaction (difficulty breathing--STAT)? no  4. What is your medication issue? Patient increased her dosage of Lasix back to 40 mg daily. The dosage was reduced at her last hospital visit, but the lowe dosage was not doing what the medicine intended. Since increasing the dosage back to 40 mg, the medicine is doing what it is intended, and keeping the fluid off.  She just wants Dr. Loletha Grayer to know of the change so they are on the same page.

## 2019-09-03 NOTE — Telephone Encounter (Signed)
Please tell her that is fine, but she should take a KCl supplement 20 mEq daily at that dose of diuretic. If her K gets too low it may increase the likelihood of abnormal heart rhythms. After a week of furosemide 40 + KCl 20, please check BMET.

## 2019-09-03 NOTE — Telephone Encounter (Signed)
Requesting: Xanax Contract: 02/21/2013 UDS: 10/27/2017, due 10/27/2018 Last OV: 08/30/2019 Next OV: N/A Last Refill: 08/02/2019, #90--0 RF Database:   Please advise

## 2019-09-07 ENCOUNTER — Other Ambulatory Visit: Payer: Self-pay | Admitting: Internal Medicine

## 2019-09-07 NOTE — Telephone Encounter (Signed)
rx refilled.

## 2019-09-13 ENCOUNTER — Other Ambulatory Visit (HOSPITAL_COMMUNITY): Payer: Self-pay | Admitting: Internal Medicine

## 2019-09-18 ENCOUNTER — Other Ambulatory Visit: Payer: Self-pay | Admitting: Family Medicine

## 2019-09-18 DIAGNOSIS — F419 Anxiety disorder, unspecified: Secondary | ICD-10-CM

## 2019-09-21 ENCOUNTER — Telehealth: Payer: Self-pay | Admitting: Cardiovascular Disease

## 2019-09-21 MED ORDER — FUROSEMIDE 20 MG PO TABS: 20.0000 mg | ORAL_TABLET | Freq: Every day | ORAL | 3 refills | Status: DC

## 2019-09-21 NOTE — Telephone Encounter (Signed)
OK to rewrite Rx.

## 2019-09-21 NOTE — Telephone Encounter (Signed)
Called patient- she states that she needs a new RX for furosemide using only one daily- and only using the two tablets as needed.  Just need okay from MD to rewrite RX.

## 2019-09-21 NOTE — Telephone Encounter (Signed)
New Message:     Please call, she needs her Furosemide prescription written different please.

## 2019-09-21 NOTE — Telephone Encounter (Signed)
RX rewritten and submitted to pharmacy.

## 2019-09-25 ENCOUNTER — Other Ambulatory Visit: Payer: Self-pay | Admitting: Cardiovascular Disease

## 2019-09-27 ENCOUNTER — Other Ambulatory Visit: Payer: Self-pay | Admitting: Family Medicine

## 2019-09-27 DIAGNOSIS — F419 Anxiety disorder, unspecified: Secondary | ICD-10-CM

## 2019-09-27 NOTE — Telephone Encounter (Signed)
Last alprazolam RX: 09/03/19, #90 Last OV: 08/30/19 Acute, 07/03/19 w/PCP Next OV: none scheduled UDS:  10/27/17 CSC:  Not on file

## 2019-09-28 ENCOUNTER — Encounter: Payer: Medicare HMO | Admitting: Internal Medicine

## 2019-10-08 ENCOUNTER — Encounter: Payer: Medicare HMO | Admitting: Cardiovascular Disease

## 2019-10-09 ENCOUNTER — Other Ambulatory Visit: Payer: Self-pay | Admitting: Family Medicine

## 2019-10-09 DIAGNOSIS — I10 Essential (primary) hypertension: Secondary | ICD-10-CM

## 2019-10-28 ENCOUNTER — Other Ambulatory Visit: Payer: Self-pay | Admitting: Family Medicine

## 2019-10-28 DIAGNOSIS — I10 Essential (primary) hypertension: Secondary | ICD-10-CM

## 2019-10-29 NOTE — Telephone Encounter (Signed)
Do you want to continue?  It looks like the ER d/c back in July.  Her last fill of this was a little before ER visit.

## 2019-10-29 NOTE — Telephone Encounter (Signed)
Cardiology already refilled it

## 2019-11-01 ENCOUNTER — Other Ambulatory Visit: Payer: Self-pay | Admitting: Family Medicine

## 2019-11-01 DIAGNOSIS — F419 Anxiety disorder, unspecified: Secondary | ICD-10-CM

## 2019-11-01 NOTE — Telephone Encounter (Signed)
Requesting: alprazolam 0.5mg  UDS: 09/2017 Last Visit: 07/03/2019 (acute10/29) Next Visit: Not scheduled Last Refill: 09/27/2019, #90, 0 refills.   Please Advise

## 2019-11-01 NOTE — Telephone Encounter (Signed)
Cardiology has been writing this

## 2019-11-05 ENCOUNTER — Other Ambulatory Visit: Payer: Self-pay | Admitting: Family Medicine

## 2019-11-05 DIAGNOSIS — I10 Essential (primary) hypertension: Secondary | ICD-10-CM

## 2019-11-12 ENCOUNTER — Other Ambulatory Visit: Payer: Self-pay

## 2019-11-12 ENCOUNTER — Encounter: Payer: Self-pay | Admitting: Cardiovascular Disease

## 2019-11-12 ENCOUNTER — Ambulatory Visit (INDEPENDENT_AMBULATORY_CARE_PROVIDER_SITE_OTHER): Payer: Medicare HMO | Admitting: Cardiovascular Disease

## 2019-11-12 VITALS — BP 122/64 | HR 60 | Ht 60.0 in | Wt 159.0 lb

## 2019-11-12 DIAGNOSIS — E669 Obesity, unspecified: Secondary | ICD-10-CM

## 2019-11-12 DIAGNOSIS — I495 Sick sinus syndrome: Secondary | ICD-10-CM | POA: Diagnosis not present

## 2019-11-12 DIAGNOSIS — E1169 Type 2 diabetes mellitus with other specified complication: Secondary | ICD-10-CM | POA: Diagnosis not present

## 2019-11-12 DIAGNOSIS — I71019 Dissection of thoracic aorta, unspecified: Secondary | ICD-10-CM

## 2019-11-12 DIAGNOSIS — Z23 Encounter for immunization: Secondary | ICD-10-CM | POA: Diagnosis not present

## 2019-11-12 DIAGNOSIS — E7800 Pure hypercholesterolemia, unspecified: Secondary | ICD-10-CM | POA: Diagnosis not present

## 2019-11-12 DIAGNOSIS — I10 Essential (primary) hypertension: Secondary | ICD-10-CM | POA: Diagnosis not present

## 2019-11-12 DIAGNOSIS — I350 Nonrheumatic aortic (valve) stenosis: Secondary | ICD-10-CM

## 2019-11-12 DIAGNOSIS — I7101 Dissection of thoracic aorta: Secondary | ICD-10-CM

## 2019-11-12 DIAGNOSIS — I4892 Unspecified atrial flutter: Secondary | ICD-10-CM

## 2019-11-12 DIAGNOSIS — Z950 Presence of cardiac pacemaker: Secondary | ICD-10-CM | POA: Diagnosis not present

## 2019-11-12 NOTE — Patient Instructions (Signed)
Medication Instructions:  No changes  Flu shot was given  *If you need a refill on your cardiac medications before your next appointment, please call your pharmacy*  Lab Work: None ordered If you have labs (blood work) drawn today and your tests are completely normal, you will receive your results only by: [name] MyChart Message (if you have MyChart) OR . A paper copy in the mail If you have any lab test that is abnormal or we need to change your treatment, we will call you to review the results.  Testing/Procedures: Your physician has requested that you have an echocardiogram at the end of January. Echocardiography is a painless test that uses sound waves to create images of your heart. It provides your doctor with information about the size and shape of your heart and how well your heart's chambers and valves are working. You may receive an ultrasound enhancing agent through an IV if needed to better visualize your heart during the echo.This procedure takes approximately one hour. There are no restrictions for this procedure. This will take place at the [address], [address].    Follow-Up: At [facility], you and your health needs are our priority.  As part of our continuing mission to provide you with exceptional heart care, we have created designated Provider Care Teams.  These Care Teams include your primary Cardiologist (physician) and Advanced Practice Providers (APPs -  Physician Assistants and Nurse Practitioners) who all work together to provide you with the care you need, when you need it.  Your next appointment:   6 month(s)  The format for your next appointment:   In Person  Provider:   Sanda Klein, MD

## 2019-11-12 NOTE — Progress Notes (Signed)
Cardiology Office Note    Date:  11/14/2019   ID:  TAKECIA DEGRAW, DOB [birth year], MRN [id number]  PCP:  Carollee Herter, Alferd Apa, DO  Cardiologist:   Sanda Klein, MD   Chief Complaint  Patient presents with  . Cardiac Valve Problem  . Thoracic Aortic Aneurysm  . Atrial Flutter    History of Present Illness:  BERNISHA PIETSCH is a 73 y.o. female with a history of repaired type A aortic dissection (resuspended native aortic valve, persistent intimal flap involving the aortic arch, left subclavian artery and abdominal aorta), subsequent development of moderate aortic stenosis and mild-moderate aortic insufficiency, chronic diastolic heart failure (EF 50%), paroxysmal monomorphic ventricular tachycardia status post ICD implantation August 2020, pre-existing sinus node dysfunction with sinus node arrest, "atrially-pacemaker dependent", atypical LBBB, history of morbid obesity, diabetes mellitus, hypertension.  She had normal coronary arteries by CT angiography in August 2020, but the coronary ostia were remarkably shallow, making her a questionable candidate for TAVR.  Her pacemaker was upgraded to ICD in August.  She saw Dr. Lovena Le in follow-up in September.  Her device had been programmed as a "shock box" with backup VVI pacing and she felt very poorly programmed in September she was reprogrammed to DDD.  Aislin is generally doing well, although she is still frequently anxious about her multiple health problems.  She has not had any new defibrillator discharges.  She has been focusing a lot on her diet and her overall health and is steadily losing weight.  She has managed to lose 16 pounds since her hospitalization over the summer and a total of 60 pounds since the summer 2019.  She is now only borderline obese.  The patient specifically denies any chest pain at rest exertion, dyspnea at rest or with exertion, orthopnea, paroxysmal nocturnal dyspnea, syncope, palpitations, focal neurological  deficits, intermittent claudication, lower extremity edema, unexplained weight gain, cough, hemoptysis or wheezing.  Interrogation of her defibrillator shows normal device function, but also a very high percentage of ventricular pacing at 81%.  Device was programmed DDD, but without rate response.  There was normal native AV conduction at a slightly prolonged AV delay.  I reprogrammed the device AAIR-DDDR and while she was in the office she had exclusively atrial paced, ventricular sensed rhythm.  Based on the previous settings her current generator longevity was estimated at 8.6 years here, but this is likely part of improvement.  Lead parameters are excellent.  The OptiVol is flat.  She has not had any atrial flutter or atrial fibrillation and she has been has not had ventricular tachycardia, even nonsustained, since her last defibrillator discharge.  She is on maximum dose carvedilol as well as Jardiance, losartan and a very low-dose of furosemide.  She is compliant with statin therapy and her most recent lipid profile showed an excellent LDL cholesterol.  She has been taking alprazolam on the average twice daily, but feels that she is doing better with her anxiety.  Most recent echo shows progression of aortic valve disease, now with moderate aortic stenosis.   Past Medical History:  Diagnosis Date  . Acute thoracic aortic dissection [facility]) 2008   emergency surgery - [place]  . Anxiety   . Cancer ([place])    skin  . Diabetes mellitus   . Diverticulosis   . GERD (gastroesophageal reflux disease)   . Hemorrhoids   . Hyperlipidemia   . Hypertension   . Kidney stones   . Sinus node dysfunction (HCC)  hx of PPM    Past Surgical History:  Procedure Laterality Date  . ABDOMINAL HYSTERECTOMY  1984  . APPENDECTOMY    . EYE SURGERY  2014   Rght eye  . ICD IMPLANT N/A 06/28/2019   Procedure: PPM upgrade to ICD;  Surgeon: Evans Lance, MD;  Location: [facility];  Service:  Cardiovascular;  Laterality: N/A;  . PACEMAKER GENERATOR CHANGE  2014   Medtronic adapta  . PACEMAKER GENERATOR CHANGE N/A 08/29/2013   Procedure: PACEMAKER GENERATOR CHANGE;  Surgeon: Sanda Klein, MD;  Location: [facility];  Service: Cardiovascular;  Laterality: N/A;  . PACEMAKER INSERTION  08/2006   dual chamber Medtronic EnRhythm; r/t sinus node dysfunction   . REPAIR OF ACUTE ASCENDING THORACIC AORTIC DISSECTION  2008   Dr. Servando Snare  . THORACIC AORTIC ANEURYSM REPAIR  2000   type III  . TRANSTHORACIC ECHOCARDIOGRAM  09/20/2012   EF=>55% with mild conc LVH; LA mod dilated; RA mildly dilated; mild MR/TR/AR    Current Medications: Outpatient Medications Prior to Visit  Medication Sig Dispense Refill  . ALPRAZolam (XANAX) 0.5 MG tablet TAKE 1 TABLET (0.5 MG TOTAL) BY MOUTH 3 (THREE) TIMES DAILY AS NEEDED FOR ANXIETY. 90 tablet 0  . amoxicillin-clavulanate (AUGMENTIN) 875-125 MG tablet Take 1 tablet by mouth 2 (two) times daily. 20 tablet 0  . carvedilol (COREG) 25 MG tablet TAKE 1 TABLET BY MOUTH TWICE A DAY WITH A MEAL 180 tablet 1  . cholecalciferol (VITAMIN D) 1000 UNITS tablet Take 1,000 Units by mouth daily.    . citalopram (CELEXA) 20 MG tablet TAKE 3 TABLETS BY MOUTH EVERY DAY 810 tablet 2  . fluticasone (FLONASE) 50 MCG/ACT nasal spray USE 2 SPRAYS IN EACH NOSTIRL EVERY DAY 16 g 1  . furosemide (LASIX) 20 MG tablet Take 1 tablet (20 mg total) by mouth daily. Okay to take 1 extra tablet as needed for swelling. 90 tablet 3  . hyoscyamine (LEVSIN SL) 0.125 MG SL tablet TAKE 1 TABLET (0.125 MG TOTAL) BY MOUTH EVERY 6 (SIX) HOURS AS NEEDED. 60 tablet 1  . JARDIANCE 10 MG TABS tablet TAKE 1 TABLET(10 MG) BY MOUTH DAILY. 30 tablet 3  . Omega-3 Fatty Acids (FISH OIL) 1000 MG CAPS Take 1 capsule by mouth daily.     Vladimir Faster Glycol-Propyl Glycol (SYSTANE OP) Apply 1 drop to eye 4 (four) times daily - after meals and at bedtime.    . Potassium Chloride ER 20 MEQ TBCR TAKE 1 TABLET BY  MOUTH EVERY DAY 90 tablet 4  . Probiotic Product (PRO-BIOTIC BLEND PO) Take 1 tablet by mouth daily.     . rosuvastatin (CRESTOR) 20 MG tablet TAKE 1 TABLET BY MOUTH EVERY DAY 90 tablet 1  . losartan (COZAAR) 25 MG tablet Take 0.5 tablets (12.5 mg total) by mouth at bedtime. 90 tablet 3   Facility-Administered Medications Prior to Visit  Medication Dose Route Frequency Provider Last Rate Last Admin  . diclofenac sodium (VOLTAREN) 1 % transdermal gel 2 g  2 g Topical QID Carollee Herter, Yvonne R, DO         Allergies:   Oxycodone, Vicodin [hydrocodone-acetaminophen], Lidocaine-aloe vera, Latex, Neomycin-bacitracin zn-polymyx, and Sulfonamide derivatives   Social History   Socioeconomic History  . Marital status: Widowed    Spouse name: Not on file  . Number of children: 2  . Years of education: Not on file  . Highest education level: Not on file  Occupational History  Employer: RETIRED  Tobacco Use  . Smoking status: Former Smoker    Quit date: 09/04/1993    Years since quitting: 26.2  . Smokeless tobacco: Never Used  Substance and Sexual Activity  . Alcohol use: No  . Drug use: No  . Sexual activity: Not Currently  Other Topics Concern  . Not on file  Social History Narrative   Lives at home and husband was just moved to [place]   Social Determinants of Health   Financial Resource Strain:   . Difficulty of Paying Living Expenses: Not on file  Food Insecurity:   . Worried About Charity fundraiser in the Last Year: Not on file  . Ran Out of Food in the Last Year: Not on file  Transportation Needs:   . Lack of Transportation (Medical): Not on file  . Lack of Transportation (Non-Medical): Not on file  Physical Activity:   . Days of Exercise per Week: Not on file  . Minutes of Exercise per Session: Not on file  Stress:   . Feeling of Stress : Not on file  Social Connections:   . Frequency of Communication with Friends and Family: Not on file  . Frequency of Social  Gatherings with Friends and Family: Not on file  . Attends Religious Services: Not on file  . Active Member of Clubs or Organizations: Not on file  . Attends Archivist Meetings: Not on file  . Marital Status: Not on file     Family History:  The patient's family history includes Heart attack in her father and maternal grandmother; Kidney disease in her mother; Valvular heart disease in her son.   ROS:   Please see the history of present illness.    ROS All other systems are reviewed and are negative.   PHYSICAL EXAM:   VS:  BP 122/64   Pulse 60   Ht 5' (1.524 m)   Wt 159 lb (72.1 kg)   BMI 31.05 kg/m      General: Alert, oriented x3, no distress, only mildly obese now, healthy defibrillator site Head: no evidence of trauma, PERRL, EOMI, no exophtalmos or lid lag, no myxedema, no xanthelasma; normal ears, nose and oropharynx Neck: normal jugular venous pulsations and no hepatojugular reflux; brisk carotid pulses without delay and no carotid bruits Chest: clear to auscultation, no signs of consolidation by percussion or palpation, normal fremitus, symmetrical and full respiratory excursions Cardiovascular: normal position and quality of the apical impulse, regular rhythm, normal first and paradoxically split second heart sounds, 3/6 early to mid peaking systolic ejection murmur in the aortic focus, no diastolic murmurs, rubs or gallops Abdomen: no tenderness or distention, no masses by palpation, no abnormal pulsatility or arterial bruits, normal bowel sounds, no hepatosplenomegaly Extremities: no clubbing, cyanosis or edema; 2+ radial, ulnar and brachial pulses bilaterally; 2+ right femoral, posterior tibial and dorsalis pedis pulses; 2+ left femoral, posterior tibial and dorsalis pedis pulses; no subclavian or femoral bruits Neurological: grossly nonfocal Psych: Normal mood and affect   Wt Readings from Last 3 Encounters:  11/12/19 159 lb (72.1 kg)  08/15/19 174 lb  (78.9 kg)  08/10/19 174 lb (78.9 kg)      Studies/Labs Reviewed:   EKG:  EKG is not ordered today.  On August 15, 2019 shows sinus bradycardia with first-degree AV block and atypical LBBB with left axis deviation, lateral ST segment depression T wave inversion, QTC 478 ms. Recent Labs: 06/28/2019: Magnesium 2.0; TSH 0.893 06/29/2019: ALT  11; BUN 18; Creatinine, Ser 1.08; Hemoglobin 9.3; Platelets 120; Potassium 4.5; Sodium 133   Lipid Panel    Component Value Date/Time   CHOL 143 06/28/2017 1446   TRIG 107.0 06/28/2017 1446   HDL 51.40 06/28/2017 1446   CHOLHDL 3 06/28/2017 1446   VLDL 21.4 06/28/2017 1446   LDLCALC 70 06/28/2017 1446   LDLDIRECT 217.7 09/13/2011 1535     ASSESSMENT:    1. Aortic valve stenosis, nonrheumatic   2. Need for immunization against influenza   3. SSS (sick sinus syndrome) ([place])   4. Pacemaker - Medtronic Adapta dual chamber Sep 2014, initial implantation 2007   5. Paroxysmal atrial flutter (HCC)   6. Aortic dissection, thoracic ([place])   7. Essential hypertension   8. Hypercholesterolemia   9. Diabetes mellitus type 2 in obese ([place])   10. Mild obesity      PLAN:  In order of problems listed above:  1. AS: She does not have symptoms of severe aortic stenosis.  She is scheduled for a follow-up echocardiogram on December 19, 2019.  Judging by the peak aortic velocity, this may occur in the next 1-2 years.  She would be a very poor quality for surgical aortic valve (due to the previous aortic significant aortic area.  She seems to be a borderline candidate for TAVR due to the relatively shallow coronary ostia and also has limited arterial access options due to the dissection.  Reviewed the most recent CT images with Dr. Gilford Raid and he believes that the left subclavian artery might be a good approach, not withstanding the chronic dissection. 2. SSS: She has almost 90% atrial pacing and a flat histogram.  Rate response sensor was turned on  today. 3. PM: Normal device function.  She has had a very high burden of ventricular pacing at 81%.  Reprogrammed MVP (AAIR-DDDR).  In spite of bradycardia and conduction abnormalities, she should continue the beta-blocker since this is critical to prevent progression of her aortic disease. 4. AFlutter: Atrial flutter has not been detected in over 3 years.  No longer on anticoagulation. 5. Hx Ao dissection: Stable by CT on yearly follow-up with Dr. Servando Snare 6. HTN: Excellent control.  We have gradually cut back on ARB due to lower blood pressure activities and his weight. 7. HLP: LDL at target (less than 100 due to diabetes mellitus, no evidence of coronary or peripheral vascular disease). According to Dr. Durwin Nora Little's report, her coronaries were "spotless" at angiography in 2008. No sign of atherosclerotic calcifications seen on serial CTs of the chest and abdomen 8. DM: Most recent A1c was acceptable at 5.5%.  On Jardiance monotherapy. 9. Obesity: She is to be congratulated on  excellent weight loss; she is very close to being just overweight.  Flu shot was administered at her request today.   Medication Adjustments/Labs and Tests Ordered: Current medicines are reviewed at length with the patient today.  Concerns regarding medicines are outlined above.  Medication changes, Labs and Tests ordered today are listed in the Patient Instructions below. Patient Instructions  Medication Instructions:  No changes  Flu shot was given  *If you need a refill on your cardiac medications before your next appointment, please call your pharmacy*  Lab Work: None ordered If you have labs (blood work) drawn today and your tests are completely normal, you will receive your results only by: [name] MyChart Message (if you have MyChart) OR . A paper copy in the mail If you have any lab  test that is abnormal or we need to change your treatment, we will call you to review the results.  Testing/Procedures: Your  physician has requested that you have an echocardiogram at the end of January. Echocardiography is a painless test that uses sound waves to create images of your heart. It provides your doctor with information about the size and shape of your heart and how well your heart's chambers and valves are working. You may receive an ultrasound enhancing agent through an IV if needed to better visualize your heart during the echo.This procedure takes approximately one hour. There are no restrictions for this procedure. This will take place at the [address].    Follow-Up: At [facility], you and your health needs are our priority.  As part of our continuing mission to provide you with exceptional heart care, we have created designated Provider Care Teams.  These Care Teams include your primary Cardiologist (physician) and Advanced Practice Providers (APPs -  Physician Assistants and Nurse Practitioners) who all work together to provide you with the care you need, when you need it.  Your next appointment:   6 month(s)  The format for your next appointment:   In Person  Provider:   Sanda Klein, MD      Signed, Sanda Klein, MD  11/14/2019 6:04 PM    [address], [place], [place]  [address] Phone: [phone]; Fax: [phone]

## 2019-11-14 ENCOUNTER — Encounter: Payer: Self-pay | Admitting: Cardiovascular Disease

## 2019-11-16 ENCOUNTER — Encounter: Payer: Self-pay | Admitting: Cardiovascular Disease

## 2019-11-16 ENCOUNTER — Other Ambulatory Visit: Payer: Self-pay

## 2019-11-16 ENCOUNTER — Ambulatory Visit (INDEPENDENT_AMBULATORY_CARE_PROVIDER_SITE_OTHER): Payer: Medicare HMO | Admitting: Cardiovascular Disease

## 2019-11-16 VITALS — Ht 60.0 in

## 2019-11-16 DIAGNOSIS — Z45018 Encounter for adjustment and management of other part of cardiac pacemaker: Secondary | ICD-10-CM

## 2019-11-16 NOTE — Patient Instructions (Signed)
Medication Instructions:  No changes *If you need a refill on your cardiac medications before your next appointment, please call your pharmacy*  Lab Work: None ordered If you have labs (blood work) drawn today and your tests are completely normal, you will receive your results only by: [name] MyChart Message (if you have MyChart) OR . A paper copy in the mail If you have any lab test that is abnormal or we need to change your treatment, we will call you to review the results.  Testing/Procedures: No ordered  Follow-Up: At [facility], you and your health needs are our priority.  As part of our continuing mission to provide you with exceptional heart care, we have created designated Provider Care Teams.  These Care Teams include your primary Cardiologist (physician) and Advanced Practice Providers (APPs -  Physician Assistants and Nurse Practitioners) who all work together to provide you with the care you need, when you need it.  Your next appointment:   Keep as previously planned

## 2019-11-16 NOTE — Progress Notes (Signed)
[name] came in to reprogram her device to DDDR mode, to avoid managed ventricular pacing-related pauses that could trigger ventricular tachycardia (as seen in August). Ventricular pacing had decreased to only 21% (compared to >80%) with AAIR-DDDR. The device is already programmed at maximum AV delay.

## 2019-11-26 ENCOUNTER — Encounter: Payer: Self-pay | Admitting: *Deleted

## 2019-11-28 ENCOUNTER — Other Ambulatory Visit: Payer: Self-pay | Admitting: Family Medicine

## 2019-11-28 ENCOUNTER — Other Ambulatory Visit: Payer: Self-pay | Admitting: Internal Medicine

## 2019-11-28 DIAGNOSIS — I10 Essential (primary) hypertension: Secondary | ICD-10-CM

## 2019-11-28 DIAGNOSIS — F419 Anxiety disorder, unspecified: Secondary | ICD-10-CM

## 2019-11-28 NOTE — Telephone Encounter (Signed)
Requesting:Alprazolam Contract:none UDS:10/27/2017 Last Visit:08/30/2019 Next Visit:none scheduled Last Refill:11/01/2019  Please Advise

## 2019-12-06 ENCOUNTER — Other Ambulatory Visit: Payer: Self-pay | Admitting: Internal Medicine

## 2019-12-08 ENCOUNTER — Other Ambulatory Visit: Payer: Self-pay | Admitting: Family Medicine

## 2019-12-08 DIAGNOSIS — F419 Anxiety disorder, unspecified: Secondary | ICD-10-CM

## 2019-12-11 DIAGNOSIS — I5032 Chronic diastolic (congestive) heart failure: Secondary | ICD-10-CM | POA: Diagnosis not present

## 2019-12-11 DIAGNOSIS — I10 Essential (primary) hypertension: Secondary | ICD-10-CM | POA: Diagnosis not present

## 2019-12-12 ENCOUNTER — Telehealth: Payer: Self-pay | Admitting: Internal Medicine

## 2019-12-12 ENCOUNTER — Other Ambulatory Visit: Payer: Self-pay

## 2019-12-12 ENCOUNTER — Telehealth: Payer: Self-pay | Admitting: Cardiovascular Disease

## 2019-12-12 ENCOUNTER — Emergency Department (HOSPITAL_COMMUNITY)
Admission: EM | Admit: 2019-12-12 | Discharge: 2019-12-12 | Disposition: A | Discharge status: Home / Self Care | Payer: Medicare HMO | Attending: Emergency Medicine | Admitting: Emergency Medicine

## 2019-12-12 ENCOUNTER — Encounter (HOSPITAL_COMMUNITY): Payer: Self-pay | Admitting: Emergency Medicine

## 2019-12-12 DIAGNOSIS — Z87891 Personal history of nicotine dependence: Secondary | ICD-10-CM | POA: Insufficient documentation

## 2019-12-12 DIAGNOSIS — Z7984 Long term (current) use of oral hypoglycemic drugs: Secondary | ICD-10-CM | POA: Insufficient documentation

## 2019-12-12 DIAGNOSIS — E119 Type 2 diabetes mellitus without complications: Secondary | ICD-10-CM | POA: Diagnosis not present

## 2019-12-12 DIAGNOSIS — Z79899 Other long term (current) drug therapy: Secondary | ICD-10-CM | POA: Insufficient documentation

## 2019-12-12 DIAGNOSIS — I10 Essential (primary) hypertension: Secondary | ICD-10-CM | POA: Diagnosis not present

## 2019-12-12 DIAGNOSIS — E875 Hyperkalemia: Secondary | ICD-10-CM | POA: Diagnosis not present

## 2019-12-12 DIAGNOSIS — Z95810 Presence of automatic (implantable) cardiac defibrillator: Secondary | ICD-10-CM | POA: Diagnosis not present

## 2019-12-12 DIAGNOSIS — R799 Abnormal finding of blood chemistry, unspecified: Secondary | ICD-10-CM | POA: Insufficient documentation

## 2019-12-12 DIAGNOSIS — Z91040 Latex allergy status: Secondary | ICD-10-CM | POA: Diagnosis not present

## 2019-12-12 DIAGNOSIS — Z0189 Encounter for other specified special examinations: Secondary | ICD-10-CM | POA: Insufficient documentation

## 2019-12-12 DIAGNOSIS — R9431 Abnormal electrocardiogram [ECG] [EKG]: Secondary | ICD-10-CM | POA: Diagnosis not present

## 2019-12-12 LAB — CBC WITH DIFFERENTIAL/PLATELET
Abs Immature Granulocytes: 0.01 10*3/uL (ref 0.00–0.07)
Basophils Absolute: 0 10*3/uL (ref 0.0–0.1)
Basophils Relative: 0 %
Eosinophils Absolute: 0.1 10*3/uL (ref 0.0–0.5)
Eosinophils Relative: 1 %
HCT: 28.3 % — ABNORMAL LOW (ref 36.0–46.0)
Hemoglobin: 8.8 g/dL — ABNORMAL LOW (ref 12.0–15.0)
Immature Granulocytes: 0 %
Lymphocytes Relative: 17 %
Lymphs Abs: 0.7 10*3/uL (ref 0.7–4.0)
MCH: 31.1 pg (ref 26.0–34.0)
MCHC: 31.1 g/dL (ref 30.0–36.0)
MCV: 100 fL (ref 80.0–100.0)
Monocytes Absolute: 0.2 10*3/uL (ref 0.1–1.0)
Monocytes Relative: 5 %
Neutro Abs: 3.2 10*3/uL (ref 1.7–7.7)
Neutrophils Relative %: 77 %
Platelets: 144 10*3/uL — ABNORMAL LOW (ref 150–400)
RBC: 2.83 MIL/uL — ABNORMAL LOW (ref 3.87–5.11)
RDW: 16 % — ABNORMAL HIGH (ref 11.5–15.5)
WBC: 4.2 10*3/uL (ref 4.0–10.5)
nRBC: 0 % (ref 0.0–0.2)

## 2019-12-12 LAB — BASIC METABOLIC PANEL
BUN/Creatinine Ratio: 26 (ref 12–28)
BUN: 35 mg/dL — ABNORMAL HIGH (ref 8–27)
CO2: 23 mmol/L (ref 20–29)
Calcium: 9.6 mg/dL (ref 8.7–10.3)
Chloride: 103 mmol/L (ref 96–106)
Creatinine, Ser: 1.35 mg/dL — ABNORMAL HIGH (ref 0.57–1.00)
GFR calc Af Amer: 45 mL/min/{1.73_m2} — ABNORMAL LOW (ref >59)
GFR calc non Af Amer: 39 mL/min/{1.73_m2} — ABNORMAL LOW (ref >59)
Glucose: 110 mg/dL — ABNORMAL HIGH (ref 65–99)
Potassium: 5.9 mmol/L (ref 3.5–5.2)
Sodium: 139 mmol/L (ref 134–144)

## 2019-12-12 LAB — COMPREHENSIVE METABOLIC PANEL
ALT: 14 U/L (ref 0–44)
AST: 19 U/L (ref 15–41)
Albumin: 3.4 g/dL — ABNORMAL LOW (ref 3.5–5.0)
Alkaline Phosphatase: 81 U/L (ref 38–126)
Anion gap: 9 (ref 5–15)
BUN: 35 mg/dL — ABNORMAL HIGH (ref 8–23)
CO2: 23 mmol/L (ref 22–32)
Calcium: 9.3 mg/dL (ref 8.9–10.3)
Chloride: 103 mmol/L (ref 98–111)
Creatinine, Ser: 1.46 mg/dL — ABNORMAL HIGH (ref 0.44–1.00)
GFR calc Af Amer: 41 mL/min — ABNORMAL LOW (ref >60)
GFR calc non Af Amer: 35 mL/min — ABNORMAL LOW (ref >60)
Glucose, Bld: 105 mg/dL — ABNORMAL HIGH (ref 70–99)
Potassium: 4.8 mmol/L (ref 3.5–5.1)
Sodium: 135 mmol/L (ref 135–145)
Total Bilirubin: 0.7 mg/dL (ref 0.3–1.2)
Total Protein: 7.5 g/dL (ref 6.5–8.1)

## 2019-12-12 NOTE — ED Notes (Signed)
Patient verbalizes understanding of discharge instructions. Opportunity for questioning and answers were provided. Armband removed by staff, pt discharged from ED via family POV, A&Ox4, clear speech, full movement of all ext. Denies pain

## 2019-12-12 NOTE — Discharge Instructions (Signed)
Follow-up with your cardiologist regarding any changes to medications. Return to the ED if you develop chest pain, shortness of breath, headache, numbness in arms or legs or blurry vision.

## 2019-12-12 NOTE — Telephone Encounter (Signed)
Cardiology Moonlighter Note  Returned page from [place] 5.9. Patient contacted by phone and made aware of this result. I recommended that she come to the ED so that we can recheck her K and administer treatment. She agrees but notes that she doesn't have a ride currently. She is going to try to find a ride to ED. I advised her not to take her potassium supplement this morning. She took her dose of losartan last night as scheduled. She will take her AM dose of lasix, drink some extra fluid, and try to find a ride to the ED.  A copy of this message will be sent to the patient's cardiologist.   Marcie Mowers, MD Cardiology Fellow, PGY-7

## 2019-12-12 NOTE — ED Provider Notes (Signed)
[facility] EMERGENCY DEPARTMENT Provider Note   CSN: [id number] Arrival date & time: 12/12/19  [id number]     History Chief Complaint  Patient presents with  . Abnormal labs    K= 5.9    [name] is a 75 y.o. female with a past medical history of AICD in place, hypertension, hyperlipidemia, diabetes presenting to the ED at the request of her cardiologist office for hyperkalemia.  States that she is scheduled to have some type of procedure next week she believes it is "an echocardiogram" by her cardiologist and had some preprocedural lab work done yesterday.  States that she was called early this morning stating that her potassium was 5.9 and that she needed to present to the emergency department immediately.  She states that overall she is feeling "just fine."  Denies any chest pain, palpitations, vomiting, diarrhea, abdominal pain, hemoptysis or leg swelling.  HPI     Past Medical History:  Diagnosis Date  . Acute thoracic aortic dissection [facility]) 2008   emergency surgery - [place]  . Anxiety   . Cancer ([place])    skin  . Diabetes mellitus   . Diverticulosis   . GERD (gastroesophageal reflux disease)   . Hemorrhoids   . Hyperlipidemia   . Hypertension   . Kidney stones   . Sinus node dysfunction (H[facility])    hx of PPM    Patient Active Problem List   Diagnosis Date Noted  . ICD (implantable cardioverter-defibrillator) in place 08/15/2019  . Cardiac syncope 06/28/2019  . Elevated troponin 06/28/2019  . Ventricular tachycardia ([place])   . Anemia 06/27/2019  . Low back pain with radiation 02/28/2019  . Severe obesity (BMI 35.0-35.9 with comorbidity) ([place]) 12/08/2018  . Panic attack 11/13/2018  . Pure hypercholesterolemia 06/28/2018  . Aortic valve stenosis, nonrheumatic 06/21/2018  . Pyelonephritis 05/11/2018  . Sinus node dysfunction ([place]) 10/11/2015  . DM (diabetes mellitus) type II uncontrolled, periph vascular disorder ([place]) 04/24/2015  . Paroxysmal  atrial flutter ([place]) 10/07/2013  . Pacemaker generator end of life 09/05/2013  . Chest pain at rest 09/05/2013  . Pacemaker - Medtronic Adapta dual chamber Sep 2014, initial implantation 2007 07/30/2013  . GERD 01/15/2011  . DIVERTICULITIS, HX OF 12/09/2010  . POSTMENOPAUSAL STATUS 07/10/2009  . NECK MASS 07/23/2008  . ACUTE BRONCHITIS 11/09/2007  . 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 Hyperlipidemia LDL goal <100 05/23/2007  . Anxiety 05/23/2007  . Essential hypertension 05/23/2007    Past Surgical History:  Procedure Laterality Date  . ABDOMINAL HYSTERECTOMY  1984  . APPENDECTOMY    . EYE SURGERY  2014   Rght eye  . ICD IMPLANT N/A 06/28/2019   Procedure: PPM upgrade to ICD;  Surgeon: Evans Lance, MD;  Location: [facility] CV LAB;  Service: Cardiovascular;  Laterality: N/A;  . PACEMAKER GENERATOR CHANGE  2014   Medtronic adapta  . PACEMAKER GENERATOR CHANGE N/A 08/29/2013   Procedure: PACEMAKER GENERATOR CHANGE;  Surgeon: Sanda Klein, MD;  Location: [facility] CATH LAB;  Service: Cardiovascular;  Laterality: N/A;  . PACEMAKER INSERTION  08/2006   dual chamber Medtronic EnRhythm; r/t sinus node dysfunction   . REPAIR OF ACUTE ASCENDING THORACIC AORTIC DISSECTION  2008   Dr. Servando Snare  . THORACIC AORTIC ANEURYSM REPAIR  2000   type III  . TRANSTHORACIC ECHOCARDIOGRAM  09/20/2012   EF=>55% with mild conc LVH;  LA mod dilated; RA mildly dilated; mild MR/TR/AR     OB History   No obstetric history on file.     Family History  Problem Relation Age of Onset  . Kidney disease Mother   . Heart attack Father   . Heart attack Maternal Grandmother   . Valvular heart disease Son        valve replacement at 80  . Cancer Neg Hx     Social History   Tobacco Use  . Smoking status: Former Smoker    Quit date: 09/04/1993    Years since quitting: 26.2  . Smokeless tobacco:  Never Used  Substance Use Topics  . Alcohol use: No  . Drug use: No    Home Medications Prior to Admission medications   Medication Sig Start Date End Date Taking? Authorizing Provider  ALPRAZolam (XANAX) 0.5 MG tablet TAKE 1 TABLET (0.5 MG TOTAL) BY MOUTH 3 (THREE) TIMES DAILY AS NEEDED FOR ANXIETY. 11/28/19   [name], DO  furosemide (LASIX) 40 MG tablet Take 0.5 tablets (20 mg total) by mouth daily. 11/28/19   [name], DO  amoxicillin-clavulanate (AUGMENTIN) 875-125 MG tablet Take 1 tablet by mouth 2 (two) times daily. 08/27/19   Roma Schanz R, DO  carvedilol (COREG) 25 MG tablet TAKE 1 TABLET BY MOUTH TWICE A DAY WITH A MEAL 08/14/19   Croitoru, Mihai, MD  cholecalciferol (VITAMIN D) 1000 UNITS tablet Take 1,000 Units by mouth daily.    [provider]  citalopram (CELEXA) 20 MG tablet TAKE 3 TABLETS BY MOUTH EVERY DAY 12/10/19   Carollee Herter, Alferd Apa, DO  fluticasone [facility]) 50 MCG/ACT nasal spray USE 2 SPRAYS IN [facility] NOSTIRL EVERY DAY 06/23/18   [name], DO  furosemide (LASIX) 20 MG tablet Take 1 tablet (20 mg total) by mouth daily. Okay to take 1 extra tablet as needed for swelling. 09/21/19   Croitoru, Mihai, MD  hyoscyamine (LEVSIN SL) 0.125 MG SL tablet TAKE 1 TABLET (0.125 MG TOTAL) BY MOUTH EVERY 6 (SIX) HOURS AS NEEDED. 09/07/19   Carollee Herter, Yvonne R, DO  JARDIANCE 10 MG TABS tablet TAKE 1 TABLET(10 MG) BY MOUTH DAILY. 09/13/19   Bensimhon, Shaune Pascal, MD  losartan (COZAAR) 25 MG tablet Take 0.5 tablets (12.5 mg total) by mouth at bedtime. 08/10/19 11/08/19  Croitoru, Mihai, MD  Omega-3 Fatty Acids (FISH OIL) 1000 MG CAPS Take 1 capsule by mouth daily.     [provider]  Polyethyl Glycol-Propyl Glycol (SYSTANE OP) Apply 1 drop to eye 4 (four) times daily - after meals and at bedtime.    [provider]  Potassium Chloride ER 20 MEQ TBCR TAKE 1 TABLET BY MOUTH EVERY DAY 09/25/19   Croitoru, Mihai, MD   Probiotic Product (PRO-BIOTIC BLEND PO) Take 1 tablet by mouth daily.     [provider]  rosuvastatin (CRESTOR) 20 MG tablet TAKE 1 TABLET BY MOUTH EVERY DAY 08/15/19   Roma Schanz R, DO    Allergies    Oxycodone, Vicodin [hydrocodone-acetaminophen], Lidocaine-aloe vera, Latex, Neomycin-bacitracin zn-polymyx, and Sulfonamide derivatives  Review of Systems   Review of Systems  Constitutional: Negative for appetite change, chills and fever.  HENT: Negative for ear pain, rhinorrhea, sneezing and sore throat.   Eyes: Negative for photophobia and visual disturbance.  Respiratory: Negative for cough, chest tightness, shortness of breath and wheezing.   Cardiovascular: Negative for chest pain and palpitations.  Gastrointestinal: Negative for  abdominal pain, blood in stool, constipation, diarrhea, nausea and vomiting.  Genitourinary: Negative for dysuria, hematuria and urgency.  Musculoskeletal: Negative for myalgias.  Skin: Negative for rash.  Neurological: Negative for dizziness, weakness and light-headedness.    Physical Exam Updated Vital Signs BP 127/73 (BP Location: Left Arm)   Pulse 67   Temp 98.2 F (36.8 C) (Oral)   Resp (!) 22   SpO2 97%   Physical Exam Vitals and nursing note reviewed.  Constitutional:      General: She is not in acute distress.    Appearance: She is well-developed.  HENT:     Head: Normocephalic and atraumatic.     Nose: Nose normal.  Eyes:     General: No scleral icterus.       Left eye: No discharge.     Conjunctiva/sclera: Conjunctivae normal.  Cardiovascular:     Rate and Rhythm: Normal rate and regular rhythm.     Heart sounds: Normal heart sounds. No murmur. No friction rub. No gallop.   Pulmonary:     Effort: Pulmonary effort is normal. No respiratory distress.     Breath sounds: Normal breath sounds.  Abdominal:     General: Bowel sounds are normal. There is no distension.     Palpations: Abdomen is soft.      Tenderness: There is no abdominal tenderness. There is no guarding.  Musculoskeletal:        General: Normal range of motion.     Cervical back: Normal range of motion and neck supple.  Skin:    General: Skin is warm and dry.     Findings: No rash.  Neurological:     Mental Status: She is alert.     Motor: No abnormal muscle tone.     Coordination: Coordination normal.     ED Results / Procedures / Treatments   Labs (all labs ordered are listed, but only abnormal results are displayed) Labs Reviewed  CBC WITH DIFFERENTIAL/PLATELET - Abnormal; Notable for the following components:      Result Value   RBC 2.83 (*)    Hemoglobin 8.8 (*)    HCT 28.3 (*)    RDW 16.0 (*)    Platelets 144 (*)    All other components within normal limits  COMPREHENSIVE METABOLIC PANEL - Abnormal; Notable for the following components:   Glucose, Bld 105 (*)    BUN 35 (*)    Creatinine, Ser 1.46 (*)    Albumin 3.4 (*)    GFR calc non Af Amer 35 (*)    GFR calc Af Amer 41 (*)    All other components within normal limits    EKG EKG Interpretation  Date/Time:  Wednesday December 12 2019 07:19:33 EST Ventricular Rate:  60 PR Interval:  336 QRS Duration: 188 QT Interval:  524 QTC Calculation: 524 R Axis:   -76 Text Interpretation: AV dual-paced rhythm with prolonged AV conduction Abnormal ECG Confirmed by Lennice Sites [phone]) on 12/12/2019 9:00:26 AM   Radiology No results found.  Procedures Procedures (including critical care time)  Medications Ordered in ED Medications - No data to display  ED Course  I have reviewed the triage vital signs and the nursing notes.  Pertinent labs & imaging results that were available during my care of the patient were reviewed by me and considered in my medical decision making (see chart for details).    MDM Rules/Calculators/A&P  74 year old female presents to ED at the request of her cardiologist for hyperkalemia found on lab  work yesterday.  She denies any symptoms states that she is at her baseline.  This was preprocedural lab work for procedure she is post to undergo next week but she is unsure the name of it.  She is overall well-appearing on exam.  Repeat potassium here was normal.  Remainder of lab work is similar to baseline.  EKG without signs of hyperkalemia or other abnormalities.  Patient will be discharged home with cardiology and PCP follow-up.  She is agreeable to the plan and is requesting discharge.  Patient is hemodynamically stable, in NAD, and able to ambulate in the ED. Evaluation does not show pathology that would require ongoing emergent intervention or inpatient treatment. I explained the diagnosis to the patient. Pain has been managed and has no complaints prior to discharge. Patient is comfortable with above plan and is stable for discharge at this time. All questions were answered prior to disposition. Strict return precautions for returning to the ED were discussed. Encouraged follow up with PCP.   An After Visit Summary was printed and given to the patient.   Portions of this note were generated with [employer]. Dictation errors may occur despite best attempts at proofreading.  Final Clinical Impression(s) / ED Diagnoses Final diagnoses:  Encounter for laboratory test    Rx / DC Orders ED Discharge Orders    None       Delia Heady, PA-C 12/12/19 [place], [place], DO 12/12/19 1615

## 2019-12-12 NOTE — Telephone Encounter (Addendum)
Spoke with pt who states she is currently in the ED after being contacted by a doctor that advised her to report to ED for hyperkalemia. She states she would like to make Dr. Sallyanne Kuster aware since he is the one who had her get the initial blood work. She states they have done repeat blood work and she is waiting for results. Reviewed 1/13 telephone encounter by Dr. Lanna Poche. Pt states she has been in ED for hours and has not been updates. She questions what high K+ level means for her. Triage nurse explained hyperkalemia and possible risks. Informed pt that repeat blood work is done to make sure the first results were accurate. Latest results are back and K+ is 4.8. Pt informed that this is WNL. Informed pt that ED MD will review results and determine what the next steps for her will be. No further questions

## 2019-12-12 NOTE — ED Triage Notes (Signed)
Patient received a call from MD this morning advised her to go to ER due to elevated potassium level , denies pain /respirations unlabored.

## 2019-12-12 NOTE — Telephone Encounter (Signed)
New message:     Patient is calling stating that she is in the ER and would like to speak with the nurse.

## 2019-12-14 ENCOUNTER — Other Ambulatory Visit: Payer: Self-pay | Admitting: Cardiovascular Disease

## 2019-12-14 DIAGNOSIS — I10 Essential (primary) hypertension: Secondary | ICD-10-CM

## 2019-12-19 ENCOUNTER — Ambulatory Visit (HOSPITAL_COMMUNITY): Payer: Medicare HMO | Attending: Cardiovascular Disease

## 2019-12-19 ENCOUNTER — Other Ambulatory Visit: Payer: Self-pay

## 2019-12-19 DIAGNOSIS — I350 Nonrheumatic aortic (valve) stenosis: Secondary | ICD-10-CM | POA: Diagnosis not present

## 2019-12-25 ENCOUNTER — Encounter: Payer: Self-pay | Admitting: Physician Assistant

## 2019-12-25 ENCOUNTER — Telehealth: Payer: Self-pay | Admitting: Cardiovascular Disease

## 2019-12-25 ENCOUNTER — Other Ambulatory Visit: Payer: Self-pay | Admitting: Physician Assistant

## 2019-12-25 DIAGNOSIS — Z23 Encounter for immunization: Secondary | ICD-10-CM | POA: Diagnosis not present

## 2019-12-25 DIAGNOSIS — I350 Nonrheumatic aortic (valve) stenosis: Secondary | ICD-10-CM

## 2019-12-25 NOTE — Telephone Encounter (Signed)
New Message    Pt is calling and is asking to speak with [name] in regards to some testing that is being scheduling  She says it is not urgent and asks for a call back around 1 pm   Please call

## 2019-12-25 NOTE — Telephone Encounter (Signed)
I believe pt may be inquiring about tests being ordered by Angelena Form, [facility]

## 2019-12-25 NOTE — Telephone Encounter (Signed)
We will take care of this 

## 2019-12-25 NOTE — Telephone Encounter (Signed)
I spoke with the pt and reviewed her pre TAVR testing instructions for 2/1 in detail. The pt will contact me with any additional questions or concerns.

## 2019-12-28 ENCOUNTER — Institutional Professional Consult (permissible substitution): Payer: Medicare HMO | Admitting: Cardiovascular Disease

## 2019-12-31 ENCOUNTER — Ambulatory Visit (HOSPITAL_COMMUNITY)
Admission: RE | Admit: 2019-12-31 | Discharge: 2019-12-31 | Disposition: A | Discharge status: Home / Self Care | Payer: Medicare HMO | Source: Ambulatory Visit | Attending: Physician Assistant | Admitting: Physician Assistant

## 2019-12-31 ENCOUNTER — Other Ambulatory Visit: Payer: Self-pay

## 2019-12-31 ENCOUNTER — Encounter: Payer: Self-pay | Admitting: Physical Therapy

## 2019-12-31 ENCOUNTER — Encounter (HOSPITAL_COMMUNITY): Payer: Medicare HMO

## 2019-12-31 ENCOUNTER — Institutional Professional Consult (permissible substitution): Payer: Medicare HMO | Admitting: Cardiovascular Disease

## 2019-12-31 ENCOUNTER — Encounter: Payer: Self-pay | Admitting: Cardiovascular Disease

## 2019-12-31 ENCOUNTER — Ambulatory Visit: Payer: Medicare HMO | Attending: Physician Assistant | Admitting: Physical Therapy

## 2019-12-31 ENCOUNTER — Ambulatory Visit: Payer: Medicare HMO | Admitting: Cardiovascular Disease

## 2019-12-31 ENCOUNTER — Ambulatory Visit (HOSPITAL_COMMUNITY)
Admission: RE | Admit: 2019-12-31 | Discharge: 2019-12-31 | Disposition: A | Discharge status: Home / Self Care | Payer: Medicare HMO | Source: Ambulatory Visit | Attending: Cardiovascular Disease | Admitting: Cardiovascular Disease

## 2019-12-31 ENCOUNTER — Ambulatory Visit (HOSPITAL_COMMUNITY): Payer: Medicare HMO

## 2019-12-31 VITALS — BP 122/74 | HR 77 | Ht 60.0 in | Wt 164.4 lb

## 2019-12-31 DIAGNOSIS — I350 Nonrheumatic aortic (valve) stenosis: Secondary | ICD-10-CM | POA: Insufficient documentation

## 2019-12-31 DIAGNOSIS — R2689 Other abnormalities of gait and mobility: Secondary | ICD-10-CM

## 2019-12-31 LAB — BASIC METABOLIC PANEL
Anion gap: 9 (ref 5–15)
BUN: 22 mg/dL (ref 8–23)
CO2: 25 mmol/L (ref 22–32)
Calcium: 9.3 mg/dL (ref 8.9–10.3)
Chloride: 104 mmol/L (ref 98–111)
Creatinine, Ser: 1.2 mg/dL — ABNORMAL HIGH (ref 0.44–1.00)
GFR calc Af Amer: 52 mL/min — ABNORMAL LOW (ref >60)
GFR calc non Af Amer: 45 mL/min — ABNORMAL LOW (ref >60)
Glucose, Bld: 92 mg/dL (ref 70–99)
Potassium: 4.5 mmol/L (ref 3.5–5.1)
Sodium: 138 mmol/L (ref 135–145)

## 2019-12-31 MED ORDER — SODIUM CHLORIDE 0.9 % WEIGHT BASED INFUSION: 3.0000 mL/kg/h | INTRAVENOUS | Status: DC

## 2019-12-31 MED ORDER — SODIUM CHLORIDE 0.9 % WEIGHT BASED INFUSION
3.0000 mL/kg/h | INTRAVENOUS | Status: AC
  Administered 2019-12-31: 3 mL/kg/h via INTRAVENOUS

## 2019-12-31 MED ORDER — IOHEXOL 350 MG/ML SOLN
100.0000 mL | Freq: Once | INTRAVENOUS | Status: AC | PRN
  Administered 2019-12-31: 100 mL via INTRAVENOUS

## 2019-12-31 MED ORDER — SODIUM CHLORIDE 0.9 % WEIGHT BASED INFUSION: 1.0000 mL/kg/h | INTRAVENOUS | Status: DC

## 2019-12-31 NOTE — Progress Notes (Signed)
Cardiology Office Note:    Date:  01/03/2020   ID:  [name], DOB December 07, 1945, MRN [id number]  PCP:  Carollee Herter, Alferd Apa, DO  Cardiologist:  Sanda Klein, MD  Electrophysiologist:  None   Referring MD: Carollee Herter, Alferd Apa, *   Chief Complaint  Patient presents with  . Aortic Stenosis    History of Present Illness:    [name] is a 74 y.o. female with a hx of aortic dissection, referred by Dr Sallyanne Kuster for evaluation of severe aortic stenosis.   The patient is here with her son today. She lives locally with her daughter.  She used to be very active but has been less so of late. She presented initially with a Type B aortic dissection in 2003 and this was managed conservatively. Her second dissection (Type A) required emergency surgical intervention by Dr Servando Snare in 2008. The aorta was replaced with a 28 mm graft from the level of the sinuses to the innominate artery and the aortic valve was resuspended.   She has purposefully lost 60 pounds with dietary changes. She feels fatigued and has progressive exercise intolerance over the past year. She denies chest pain or pressure. She does have episodes of lightheadedness. She had an episode of syncope in July 2020 secondary to sustained monomorphic VT. She underwent upgrade from a pacemaker to an ICD at the time. She has known of a heart murmur for several years now and has been followed closely by Dr Sallyanne Kuster with serial imaging studies. She has no hx of rheumatic fever.  The patient admits to shortness of breath with activity.  She does not have orthopnea or PND. She denies symptoms of leg claudication.  Past Medical History:  Diagnosis Date  . Acute thoracic aortic dissection [facility]) 2008   emergency surgery - [place]  . Anxiety   . Cancer ([place])    skin  . Diabetes mellitus   . Diverticulosis   . GERD (gastroesophageal reflux disease)   . Hemorrhoids   . Hyperlipidemia   . Hypertension   . Kidney stones   . Sinus  node dysfunction (HCC)    hx of PPM    Past Surgical History:  Procedure Laterality Date  . ABDOMINAL HYSTERECTOMY  1984  . APPENDECTOMY    . EYE SURGERY  2014   Rght eye  . ICD IMPLANT N/A 06/28/2019   Procedure: PPM upgrade to ICD;  Surgeon: Evans Lance, MD;  Location: [place];  Service: Cardiovascular;  Laterality: N/A;  . PACEMAKER GENERATOR CHANGE  2014   Medtronic adapta  . PACEMAKER GENERATOR CHANGE N/A 08/29/2013   Procedure: PACEMAKER GENERATOR CHANGE;  Surgeon: Sanda Klein, MD;  Location: [facility];  Service: Cardiovascular;  Laterality: N/A;  . PACEMAKER INSERTION  08/2006   dual chamber Medtronic EnRhythm; r/t sinus node dysfunction   . REPAIR OF ACUTE ASCENDING THORACIC AORTIC DISSECTION  2008   Dr. Servando Snare  . THORACIC AORTIC ANEURYSM REPAIR  2000   type III  . TRANSTHORACIC ECHOCARDIOGRAM  09/20/2012   EF=>55% with mild conc LVH; LA mod dilated; RA mildly dilated; mild MR/TR/AR    Current Medications: Current Meds  Medication Sig  . carvedilol (COREG) 25 MG tablet TAKE 1 TABLET BY MOUTH TWICE A DAY WITH A MEAL (Patient taking differently: Take 25 mg by mouth 2 (two) times daily with a meal. )  . citalopram (CELEXA) 20 MG tablet TAKE 3 TABLETS BY MOUTH EVERY DAY (Patient taking differently:  Take 60 mg by mouth daily. )  . fluticasone (FLONASE) 50 MCG/ACT nasal spray USE 2 SPRAYS IN EACH NOSTIRL EVERY DAY (Patient taking differently: Place 2 sprays into both nostrils daily as needed for allergies. )  . furosemide (LASIX) 20 MG tablet Take 1 tablet (20 mg total) by mouth daily. Okay to take 1 extra tablet as needed for swelling. (Patient taking differently: Take 20-40 mg by mouth daily. to take 1 extra tablet as needed for swelling.)  . hyoscyamine (LEVSIN SL) 0.125 MG SL tablet TAKE 1 TABLET (0.125 MG TOTAL) BY MOUTH EVERY 6 (SIX) HOURS AS NEEDED. (Patient not taking: Reported on 01/02/2020)  . Omega-3 Fatty Acids (FISH OIL) 1000 MG CAPS Take 1,000 mg by  mouth daily.   Vladimir Faster Glycol-Propyl Glycol (SYSTANE OP) Place 1 drop into both eyes daily as needed (Dry eyes).   . Potassium Chloride ER 20 MEQ TBCR TAKE 1 TABLET BY MOUTH EVERY DAY (Patient taking differently: Take 20 mEq by mouth daily. )  . Probiotic Product (PRO-BIOTIC BLEND PO) Take 1 tablet by mouth daily. [employer] brand  . rosuvastatin (CRESTOR) 20 MG tablet TAKE 1 TABLET BY MOUTH EVERY DAY (Patient taking differently: Take 20 mg by mouth daily. )  . [DISCONTINUED] ALPRAZolam (XANAX) 0.5 MG tablet TAKE 1 TABLET (0.5 MG TOTAL) BY MOUTH 3 (THREE) TIMES DAILY AS NEEDED FOR ANXIETY.  . [DISCONTINUED] cholecalciferol (VITAMIN D) 1000 UNITS tablet Take 1,000 Units by mouth daily.  . [DISCONTINUED] JARDIANCE 10 MG TABS tablet TAKE 1 TABLET(10 MG) BY MOUTH DAILY.   Current Facility-Administered Medications for the 12/31/19 encounter (Office Visit) with Sherren Mocha, MD  Medication  . diclofenac sodium (VOLTAREN) 1 % transdermal gel 2 g     Allergies:   Oxycodone, Vicodin [hydrocodone-acetaminophen], Lidocaine-aloe vera, Latex, Neomycin-bacitracin zn-polymyx, and Sulfonamide derivatives   Social History   Socioeconomic History  . Marital status: Widowed    Spouse name: Not on file  . Number of children: 2  . Years of education: Not on file  . Highest education level: Not on file  Occupational History    Employer: RETIRED  Tobacco Use  . Smoking status: Former Smoker    Quit date: 09/04/1993    Years since quitting: 26.3  . Smokeless tobacco: Never Used  Substance and Sexual Activity  . Alcohol use: No  . Drug use: No  . Sexual activity: Not Currently  Other Topics Concern  . Not on file  Social History Narrative   Lives at home and husband was just moved to [place]   Social Determinants of Health   Financial Resource Strain:   . Difficulty of Paying Living Expenses: Not on file  Food Insecurity:   . Worried About Charity fundraiser in the Last Year: Not on file  .  Ran Out of Food in the Last Year: Not on file  Transportation Needs:   . Lack of Transportation (Medical): Not on file  . Lack of Transportation (Non-Medical): Not on file  Physical Activity:   . Days of Exercise per Week: Not on file  . Minutes of Exercise per Session: Not on file  Stress:   . Feeling of Stress : Not on file  Social Connections:   . Frequency of Communication with Friends and Family: Not on file  . Frequency of Social Gatherings with Friends and Family: Not on file  . Attends Religious Services: Not on file  . Active Member of Clubs or Organizations: Not on file  .  Attends Archivist Meetings: Not on file  . Marital Status: Not on file     Family History: The patient's family history includes Heart attack in her father and maternal grandmother; Kidney disease in her mother; Valvular heart disease in her son. There is no history of Cancer.  ROS:   Please see the history of present illness.    All other systems reviewed and are negative.  EKGs/Labs/Other Studies Reviewed:    The following studies were reviewed today: Carotid US: Summary:  Right Carotid: The extracranial vessels were near-normal with only minimal  wall         thickening or plaque.   Left Carotid: The extracranial vessels were near-normal with only minimal  wall        thickening or plaque.   Vertebrals: Right vertebral artery demonstrates antegrade flow. Left  vertebral        artery was not visualized.  Subclavians: Normal flow hemodynamics were seen in bilateral subclavian        arteries.   Echo 12-19-2019: IMPRESSIONS    1. Left ventricular ejection fraction, by visual estimation, is 40 to  45%. The left ventricle has mild to moderately decreased function. There  is moderately increased left ventricular hypertrophy.  2. Moderately dilated left ventricular internal cavity size.  3. The left ventricle demonstrates regional wall motion  abnormalities.  4. Septal and inferior wall hypokinesis Since July EF has decreased and  AV gradient have increased. Patient may benefit from further evaluation to  include right and left heart cath.  5. Global right ventricle has normal systolic function.The right  ventricular size is normal. No increase in right ventricular wall  thickness.  6. Left atrial size was mildly dilated.  7. Right atrial size was mildly dilated.  8. The mitral valve is normal in structure. Mild mitral valve  regurgitation.  9. The tricuspid valve is normal in structure.  10. The tricuspid valve is normal in structure. Tricuspid valve  regurgitation is moderate.  11. The aortic valve has an indeterminant number of cusps. Aortic valve  regurgitation is mild. Severe aortic valve stenosis.  12. There is severe calcifcation of the aortic valve.  13. There is severe thickening of the aortic valve.  14. Pulmonic regurgitation is mild.  15. The pulmonic valve was grossly normal. Pulmonic valve regurgitation is  mild.  16. Post graft repair for dissection with no residual aneurysm.  17. Moderately elevated pulmonary artery systolic pressure.  18. The interatrial septum was not well visualized.   FINDINGS  Left Ventricle: Left ventricular ejection fraction, by visual estimation,  is 40 to 45%. The left ventricle has mild to moderately decreased  function. The left ventricle demonstrates regional wall motion  abnormalities. The left ventricular internal  cavity size was moderately dilated left ventricle. There is moderately  increased left ventricular hypertrophy. Septal and inferior wall  hypokinesis Since July EF has decreased and AV gradient have increased.  Patient may benefit from further evaluation to  include right and left heart cath.   Right Ventricle: The right ventricular size is normal. No increase in  right ventricular wall thickness. Global RV systolic function is has  normal systolic  function. The tricuspid regurgitant velocity is 3.06 m/s,  and with an assumed right atrial pressure  of 10 mmHg, the estimated right ventricular systolic pressure is  moderately elevated at 47.4 mmHg. AICD wire seen in RA/RV.   Left Atrium: Left atrial size was mildly dilated.   Right  Atrium: Right atrial size was mildly dilated   Pericardium: There is no evidence of pericardial effusion.   Mitral Valve: The mitral valve is normal in structure. There is mild  thickening of the mitral valve leaflet(s). There is mild calcification of  the mitral valve leaflet(s). Mild mitral valve regurgitation.   Tricuspid Valve: The tricuspid valve is normal in structure. Tricuspid  valve regurgitation is moderate.   Aortic Valve: The aortic valve has an indeterminant number of cusps. .  There is severe thickening and severe calcifcation of the aortic valve.  Aortic valve regurgitation is mild. Aortic regurgitation PHT measures 338  msec. Severe aortic stenosis is  present. Moderate aortic valve annular calcification. There is severe  thickening of the aortic valve. There is severe calcifcation of the aortic  valve. Aortic valve mean gradient measures 26.5 mmHg. Aortic valve peak  gradient measures 48.2 mmHg. Aortic  valve area, by VTI measures 0.63 cm. Valve is heavily calcified with DVI  0.2 suggesting severe low gradient low flow AS with mean gradient 27 mmHg  due to low EF.   Pulmonic Valve: The pulmonic valve was grossly normal. Pulmonic valve  regurgitation is mild. Pulmonic regurgitation is mild.   Aorta: The aortic root is normal in size and structure. Post graft repair  for dissection with no residual aneurysm.   IAS/Shunts: The interatrial septum was not well visualized.     LEFT VENTRICLE  PLAX 2D  LVIDd:     5.00 cm Diastology  LVIDs:     3.30 cm LV e' lateral:  9.03 cm/s  LV PW:     1.30 cm LV E/e' lateral: 11.0  LV IVS:    1.60 cm LV e' medial:   4.13 cm/s  LVOT diam:   2.00 cm LV E/e' medial: 24.0  LV SV:     74 ml  LV SV Index:  41.72  LVOT Area:   3.14 cm     RIGHT VENTRICLE      IVC  TAPSE (M-mode): 1.5 cm   IVC diam: 1.00 cm  RVSP:      40.4 mmHg   LEFT ATRIUM       Index    RIGHT ATRIUM      Index  LA diam:    4.20 cm 2.48 cm/m RA Pressure: 3.00 mmHg  LA Vol (A2C):  67.4 ml 39.81 ml/m RA Area:   21.90 cm  LA Vol (A4C):  78.4 ml 46.30 ml/m RA Volume:  70.80 ml 41.81 ml/m  LA Biplane Vol: 75.6 ml 44.65 ml/m  AORTIC VALVE  AV Area (Vmax):  0.58 cm  AV Area (Vmean):  0.62 cm  AV Area (VTI):   0.63 cm  AV Vmax:      347.00 cm/s  AV Vmean:     241.500 cm/s  AV VTI:      0.864 m  AV Peak Grad:   48.2 mmHg  AV Mean Grad:   26.5 mmHg  LVOT Vmax:     63.70 cm/s  LVOT Vmean:    47.500 cm/s  LVOT VTI:     0.173 m  LVOT/AV VTI ratio: 0.20  AI PHT:      338 msec    AORTA  Ao Root diam: 3.50 cm  Ao Asc diam: 2.90 cm   MITRAL VALVE            TRICUSPID VALVE  MV Area (PHT):           TR Peak grad:  37.4 mmHg                   TR Vmax:    312.00 cm/s  MV Decel Time: 164 msec       Estimated RAP: 3.00 mmHg  MV E velocity: 99.20 cm/s 103 cm/s RVSP:      40.4 mmHg  MV A velocity: 72.80 cm/s 70.3 cm/s  MV E/A ratio: 1.36    1.5    SHUNTS                   Systemic VTI: 0.17 m                   Systemic Diam: 2.00 cm   Cardiac CTA: FINDINGS: Aortic Valve: Tricuspid aortic valve. Moderately reduced cusp separation. Moderately thickened, severely calcified aortic valve cusps.  AV calcium score: 1713  Virtual Basal Annulus Measurements:  Maximum/Minimum Diameter: 24.9 x 21.3 mm  Perimeter: 73.1 mm  Area: 414 mm2  Focal calcification at the annulus.  Based on these measurements, the annulus would  be suitable for a 23 mm Edwards Sapien 3 valve.  Sinus of Valsalva Measurements:  Non-coronary: 33 mm  Right - coronary: 34 mm  Left - coronary: 32 mm  Sinus of Valsalva Height:  Left: 17.8 mm  Right: 20.4 mm  Aorta: Previous ascending aorta dissection (Type A) s/p ascending aorta replacement and resuspension of aortic valve. Just distal to the replaced segment, the aorta demonstrates dissection, starting in the distal ascending aorta just proximal to the takeoff of the right brachiocephalic artery and extending through the entire thoracic aorta. May involve the ostium of the right brachiocephalic artery. Extent of dissection described separately on CT Abdomen and Pelvis. On limited review of previous exam, no significant change in appearance or proximal extent of dissection.  Sinotubular Junction: 28 mm  Ascending Thoracic Aorta: 29 mm within graft  Aortic Arch: 18.1 mm true lumen, 12.4 mm false lumen  Descending Thoracic Aorta: Total diameter 33 mm, true lumen 13 mm, false lumen 19 mm  Coronary Artery Height above Annulus:  Left Main: 14.1 mm  Right Coronary: 14 mm  Coronary Arteries: 3 vessel coronary artery calcifications.  Optimum Fluoroscopic Angle for Delivery: LAO 7 CAU 5  Dual chamber ICD noted.  Normal appearing LV apex.  IMPRESSION: 1. Tricuspid aortic valve. Moderately reduced cusp separation. Moderately thickened, severely calcified aortic valve cusps.  2. AV calcium score: 1713  3. Previous ascending aorta dissection (Type A) s/p ascending aorta replacement and resuspension of aortic valve. Just distal to the replaced segment, the aorta demonstrates dissection, starting in the distal ascending aorta just proximal to the takeoff of the right brachiocephalic artery and extending through the entire thoracic aorta. May involve the ostium of the right brachiocephalic artery. Extent of dissection described separately on CT  Abdomen and Pelvis. On limited review of previous exam, no significant change in appearance or proximal extent of dissection.  4.  Adequate coronary heights above the annulus, 14 mm  5. Optimum Fluoroscopic Angle for Delivery: LAO 7 CAU 5  6.  Dual chamber ICD.  CTA Chest, Abd, Pelvis: VASCULAR MEASUREMENTS PERTINENT TO TAVR:  AORTA:  Minimal Aortic Diameter-12.2 x 8.8 mm  Severity of Aortic Calcification-moderate  RIGHT PELVIS:  Right Common Iliac Artery -  Minimal Diameter-10.1 x 9.7 mm  Tortuosity-mild  Calcification-mild  Right External Iliac Artery -  Minimal Diameter-6.9 x 6.5 mm  Tortuosity-mild-to-moderate  Calcification-none  Right Common Femoral Artery -  Minimal Diameter-7.4 x 7.0 mm  Tortuosity-mild  Calcification-mild  LEFT PELVIS:  Left Common Iliac Artery -  Minimal Diameter-9.8 x 7.8 mm (true lumen)  Tortuosity-mild  Calcification-mild  Left External Iliac Artery -  Minimal Diameter-6.6 x 6.5 mm  Tortuosity-mild  Calcification-none  Left Common Femoral Artery -  Minimal Diameter-6.8 x 6.8 mm  Tortuosity-mild  Calcification-none  Review of the MIP images confirms the above findings.  IMPRESSION: 1. Vascular findings and measurements pertinent to potential TAVR procedure, as detailed. 2. Diffuse thickening and calcification of the aortic valve, compatible with the reported history of severe aortic stenosis. 3. Chronic type A aortic dissection status post ascending thoracic aortic repair, with dissection flap extending into left common iliac artery, unchanged. 4. Moderate cardiomegaly. 5. Chronic mild splenomegaly. 6. Right adrenal nodule is stable in size since 06/28/2019 CT, probably a benign adenoma. Follow-up CT abdomen without and with IV contrast in 12 months suggested. This recommendation follows ACR consensus guidelines: Management of Incidental Adrenal Masses: A White Paper  of the ACR Incidental Findings Committee. J Am Coll Radiol 2017;14:1038-1044. 7. Marked diffuse colonic diverticulosis. 8. Small hiatal hernia. 9.  Aortic Atherosclerosis (ICD10-I70.0).  EKG:  EKG is not ordered today.   Recent Labs: 06/28/2019: Magnesium 2.0; TSH 0.893 12/12/2019: ALT 14; Hemoglobin 8.8; Platelets 144 12/31/2019: BUN 22; Creatinine, Ser 1.20; Potassium 4.5; Sodium 138  Recent Lipid Panel    Component Value Date/Time   CHOL 143 06/28/2017 1446   TRIG 107.0 06/28/2017 1446   HDL 51.40 06/28/2017 1446   CHOLHDL 3 06/28/2017 1446   VLDL 21.4 06/28/2017 1446   LDLCALC 70 06/28/2017 1446   LDLDIRECT 217.7 09/13/2011 1535    Physical Exam:    VS:  BP 122/74   Pulse 77   Ht 5' (1.524 m)   Wt 164 lb 6.4 oz (74.6 kg)   SpO2 98%   BMI 32.11 kg/m     Wt Readings from Last 3 Encounters:  12/31/19 164 lb 6.4 oz (74.6 kg)  12/31/19 162 lb (73.5 kg)  11/12/19 159 lb (72.1 kg)     GEN:  Well nourished, well developed in no acute distress HEENT: Normal NECK: No JVD; BL carotid bruits LYMPHATICS: No lymphadenopathy CARDIAC: RRR, 3/6 harsh systolic murmur at the RUSB RESPIRATORY:  Clear to auscultation without rales, wheezing or rhonchi  ABDOMEN: Soft, non-tender, non-distended MUSCULOSKELETAL:  No edema; No deformity  SKIN: Warm and dry NEUROLOGIC:  Alert and oriented x 3 PSYCHIATRIC:  Normal affect   STS Risk Calculator: Isolated AVR: Risk of Mortality: 3.863% Renal Failure: 5.460% Permanent Stroke: 2.900% Prolonged Ventilation: 13.945% DSW Infection: 0.202% Reoperation: 3.639% Morbidity or Mortality: 18.051% Short Length of Stay: 23.214% Long Length of Stay: 12.710%  ASSESSMENT:    1. Severe aortic stenosis    PLAN:    In order of problems listed above:  The patient has severe Stage D2 aortic stenosis (probable low flow low gradient) with NYHA functional class II symptoms of chronic combined systolic and diastolic heart failure. The  patient's echo study is personally reviewed. She has severe calcification of the aortic valve leaflets with associated reduction in leaflet motion. Peak and mean transvalvular gradients are 48 and 27 mmHg, respectively. Dimensionless index is 0.2. LVEF is mildly depressed. Compared to prior echo studies, LVEF is lower and dimensionless aortic valve index is also lower. I have reviewed the natural history of aortic stenosis with the patient and her son who is present today. We have discussed the limitations of medical  therapy and the poor prognosis associated with symptomatic aortic stenosis. We have reviewed potential treatment options, including palliative medical therapy, conventional surgical aortic valve replacement, and transcatheter aortic valve replacement. We discussed treatment options in the context of the patient's specific comorbid medical conditions. Gated cardiac CTA and CTA of the chest, abdomen, and pelvis are reviewed as part of her evaluation. Cardiac anatomy is anatomically suitable for TAVR with a 23 mm Sapien valve. The aortic arch, descending thoracic, and abdominal aorta exhibit changes consistent with her known chronic dissection.   Considering her symptomatic status and presence of LV dysfunction, I agree that it is appropriate to pursue aortic valve replacement. With chronic aortic dissection, and prior surgical repair of type A dissection, TAVR might be the patient's best treatment option associated with lower morbidity. TAVR access is complicated by the presence of chronic dissection. I suspect a transapical approach might be the only safe way to access the patient for TAVR. She will need to undergo right and left heart catheterization prior to proceeding with further multidisciplinary team evaluation. I will plan to access her right radial artery if it is suitable for catheter access. Otherwise I will use a transfemoral approach for cath. The right iliofemoral vessels do not exhibit  findings of chronic dissection. I have reviewed the risks, indications, and alternatives to cardiac catheterization, possible angioplasty, and stenting with the patient. Risks include but are not limited to bleeding, infection, vascular injury, stroke, myocardial infection, arrhythmia, kidney injury, radiation-related injury in the case of prolonged fluoroscopy use, emergency cardiac surgery, and death. The patient understands the risks of serious complication is 1-2 in [id number] with diagnostic cardiac cath and 1-2% or less with angioplasty/stenting. After completion of preoperative studies, the patient will undergo formal surgical consultation as part of a multidisciplinary approach to her care.    Medication Adjustments/Labs and Tests Ordered: Current medicines are reviewed at length with the patient today.  Concerns regarding medicines are outlined above.  No orders of the defined types were placed in this encounter.  No orders of the defined types were placed in this encounter.   Patient Instructions  COVID SCREENING INFORMATION (01/07/2020): You are scheduled for your COVID screening on: February 8 at 12:45PM.  [facility] ([facility]) [address] Stay in the RIGHT lane and proceed under the brick awning and tell them you are there for pre-procedure testing. Do NOT bring any pets with you to the testing site.   CATHETERIZATION INSTRUCTIONS (01/09/2020): You are scheduled for a Cardiac Catheterization on: Wednesday, February 10.  1. Please arrive at the [facility] (Main Entrance A) at [facility]: [place], [place] [address] at: 8:00AM. (This time is two hours before your procedure to ensure your preparation). Free valet parking service is available. You are allowed ONE visitor in the waiting room during your procedures. Both you and your visitor must wear masks. Special note: Every effort is made to have your procedure done on time.  Please understand that emergencies sometimes delay scheduled procedures.  2. Diet: Do not eat solid foods after midnight.  You may have clear liquids until 5am upon the day of the procedure.  3. Medication instructions in preparation for your procedure:  1) HOLD LOSARTAN the night before your cath.  2) HOLD LASIX AND POTASSIUM the morning of your cath  3) TAKE ASPIRIN 81 mg the morning of your cath  4) HOLD JARDIANCE the morning of your cath  5) You  may take your other medications as directed with sips of water.  4. Plan for one night stay--bring personal belongings. 5. Bring a current list of your medications and current insurance cards. 6. You MUST have a responsible person to drive you home. 7. Someone MUST be with you the first 24 hours after you arrive home or your discharge will be delayed. 8. Please wear clothes that are easy to get on and off and wear slip-on shoes.  Thank you for allowing [name] to care for you!   -- [facility]     Signed, Sherren Mocha, MD  01/03/2020 2:22 PM    [place]

## 2019-12-31 NOTE — Progress Notes (Signed)
Called CT at 0900 and reported pt's first hour of fluids would be complete at 0935.

## 2019-12-31 NOTE — Therapy (Signed)
[place], [place], [address] Phone: [phone]   Fax:  [phone]  Physical Therapy Evaluation  Patient Details  Name: [name] MRN: [id number] Date of Birth: 01/11/1946 Referring Provider (PT): Eileen Stanford, [place]   Encounter Date: 12/31/2019  PT End of Session - 12/31/19 1321    Visit Number  1    Number of Visits  1    Authorization Type  [employer] MCR    PT Start Time  [id number]    PT Stop Time  1345    PT Time Calculation (min)  30 min    Equipment Utilized During Treatment  Gait belt    Activity Tolerance  Patient tolerated treatment well;Patient limited by fatigue    Behavior During Therapy  [facility] for tasks assessed/performed       Past Medical History:  Diagnosis Date  . Acute thoracic aortic dissection [facility]) 2008   emergency surgery - [place]  . Anxiety   . Cancer ([place])    skin  . Diabetes mellitus   . Diverticulosis   . GERD (gastroesophageal reflux disease)   . Hemorrhoids   . Hyperlipidemia   . Hypertension   . Kidney stones   . Sinus node dysfunction (H[facility])    hx of PPM    Past Surgical History:  Procedure Laterality Date  . ABDOMINAL HYSTERECTOMY  1984  . APPENDECTOMY    . EYE SURGERY  2014   Rght eye  . ICD IMPLANT N/A 06/28/2019   Procedure: PPM upgrade to ICD;  Surgeon: Evans Lance, MD;  Location: [facility];  Service: Cardiovascular;  Laterality: N/A;  . PACEMAKER GENERATOR CHANGE  2014   Medtronic adapta  . PACEMAKER GENERATOR CHANGE N/A 08/29/2013   Procedure: PACEMAKER GENERATOR CHANGE;  Surgeon: Sanda Klein, MD;  Location: [facility];  Service: Cardiovascular;  Laterality: N/A;  . PACEMAKER INSERTION  08/2006   dual chamber Medtronic EnRhythm; r/t sinus node dysfunction   . REPAIR OF ACUTE ASCENDING THORACIC AORTIC DISSECTION  2008   Dr. Servando Snare  . THORACIC AORTIC ANEURYSM REPAIR  2000   type III  . TRANSTHORACIC ECHOCARDIOGRAM  09/20/2012    EF=>55% with mild conc LVH; LA mod dilated; RA mildly dilated; mild MR/TR/AR    There were no vitals filed for this visit.   Subjective Assessment - 12/31/19 1316    Subjective  Patient reports that she has had a lot of heart issues and she now has a defribrillator, she has known for a while she would need a valve replaced.    Pertinent History  History of repaired type A aortic dissection (resuspended native aortic valve, persistent intimal flap involving the aortic arch, left subclavian artery and abdominal aorta), subsequent development of moderate aortic stenosis and mild-moderate aortic insufficiency, chronic diastolic heart failure (EF 50%), paroxysmal monomorphic ventricular tachycardia status post ICD implantation August 2020, pre-existing sinus node dysfunction with sinus node arrest, "atrially-pacemaker dependent", atypical LBBB, history of morbid obesity, diabetes mellitus, hypertension.    Limitations  House hold activities;Walking    How long can you sit comfortably?  No limitation    How long can you stand comfortably?  5 minutes    How long can you walk comfortably?  5 minutes    Patient Stated Goals  Get heart better    Currently in Pain?  No/denies         [facility] PT Assessment - 12/31/19 0001  Assessment   Medical Diagnosis  Severe aortic stenosis    Referring Provider (PT)  Eileen Stanford, PA-C    Onset Date/Surgical Date  --   patient reports fatigue for many years   Hand Dominance  Right    Next MD Visit  12/30/2018      Precautions   Precautions  None      Restrictions   Weight Bearing Restrictions  No      Balance Screen   Has the patient fallen in the past 6 months  Yes    How many times?  1 - August 2020    Has the patient had a decrease in activity level because of a fear of falling?   No    Is the patient reluctant to leave their home because of a fear of falling?   No      Home Environment   Living Environment  Private residence    Living  Arrangements  Children    Type of [place] Access  Level entry    Home Layout  One level      Prior Function   Level of [place] with basic ADLs      Cognition   Overall Cognitive Status  Within Functional Limits for tasks assessed      ROM / Strength   AROM / PROM / Strength  AROM;Strength      AROM   Overall AROM Comments  UE and LE AROM grossly WFL      Strength   Overall Strength Comments  UE and LE strength grossly 4/5 throughout    Strength Assessment Site  Hand    Right/Left hand  Right;Left    Right Hand Grip (lbs)  35    Left Hand Grip (lbs)  35      Transfers   Transfers  Independent with all Transfers      Ambulation/Gait   Ambulation/Gait  Yes    Ambulation/Gait Assistance  6: Modified independent (Device/Increase time)    Gait Comments  Patient exhibits decreased gait speed with decreased step length       [facility] Pre-Surgical Assessment - 12/31/19 0001    5 Meter Walk Test- trial 1  8 sec    5 Meter Walk Test- trial 2  8 sec.     5 Meter Walk Test- trial 3  8 sec.    5 meter walk test average  8 sec    4 Stage Balance Test tolerated for:   10 sec.    4 Stage Balance Test Position  1    Sit To Stand Test- trial 1  23 sec.    ADL/IADL Independent with:  Bathing;Dressing;Meal prep;Finances    ADL/IADL Needs Assistance with:  [place]aver work    ADL/IADL Fraility Index  Midly frail    Other comment  4/12    6 Minute Walk- Baseline  yes    BP (mmHg)  144/60    HR (bpm)  58    02 Sat (%RA)  97 %    Modified Borg Scale for Dyspnea  0- Nothing at all    Perceived Rate of Exertion (Borg)  6-    6 Minute Walk Post Test  yes    BP (mmHg)  155/70    HR (bpm)  84    02 Sat (%RA)  96 %    Modified Borg Scale for Dyspnea  2- Mild shortness of breath  Perceived Rate of Exertion (Borg)  12-    Aerobic Endurance Distance Walked  470              Objective measurements completed on examination: See above findings.               PT Education - 12/31/19 1320    Education Details  Activity modification    Person(s) Educated  Patient    Methods  Explanation    Comprehension  Verbalized understanding                  Plan - 12/31/19 1321    Clinical Impression Statement  See below.    Personal Factors and Comorbidities  Age;Time since onset of injury/illness/exacerbation;Past/Current Experience;Comorbidity 3+    Comorbidities  History of repaired type A aortic dissection (resuspended native aortic valve, persistent intimal flap involving the aortic arch, left subclavian artery and abdominal aorta), subsequent development of moderate aortic stenosis and mild-moderate aortic insufficiency, chronic diastolic heart failure (EF 50%), paroxysmal monomorphic ventricular tachycardia status post ICD implantation August 2020, pre-existing sinus node dysfunction with sinus node arrest, "atrially-pacemaker dependent", atypical LBBB, history of morbid obesity, diabetes mellitus, hypertension.    Examination-Activity Limitations  Locomotion Level    Examination-Participation Restrictions  Yard Work;Community Activity;Shop    Stability/Clinical Decision Making  Stable/Uncomplicated    Clinical Decision Making  Low    PT Frequency  One time visit    PT Treatment/Interventions  ADLs/Self Care Home Management;Gait training;Therapeutic exercise;Balance training;Patient/family education;Energy conservation    Consulted and Agree with Plan of Care  Patient       Clinical Impression Statement: Pt is a 74 yo female presenting to OP PT for evaluation prior to possible TAVR surgery due to severe aortic stenosis. Pt reports onset of fatigue approximately 6-8 months months ago. Symptoms are limiting daily activity. Pt presents with good ROM and strength, poor balance and is at high fall risk 4 stage balance test, decreased walking speed and decreased aerobic endurance per 6 minute walk test. Pt ambulated a total  of 470 feet in 6 minute walk. Fatigue increased significantly with 6 minute walk test. Based on the Short Physical Performance Battery, patient has a frailty rating of 4/12 with </= 5/12 considered frail.   Patient demonstrates the following deficits and impairments:  Abnormal gait, Postural dysfunction, Decreased activity tolerance, Decreased balance, Decreased endurance, Difficulty walking  Visit Diagnosis: Other abnormalities of gait and mobility     Problem List Patient Active Problem List   Diagnosis Date Noted  . ICD (implantable cardioverter-defibrillator) in place 08/15/2019  . Cardiac syncope 06/28/2019  . Elevated troponin 06/28/2019  . Ventricular tachycardia ([place])   . Anemia 06/27/2019  . Low back pain with radiation 02/28/2019  . Severe obesity (BMI 35.0-35.9 with comorbidity) ([place]) 12/08/2018  . Panic attack 11/13/2018  . Pure hypercholesterolemia 06/28/2018  . Aortic valve stenosis, nonrheumatic 06/21/2018  . Pyelonephritis 05/11/2018  . Sinus node dysfunction ([place]) 10/11/2015  . DM (diabetes mellitus) type II uncontrolled, periph vascular disorder ([place]) 04/24/2015  . Paroxysmal atrial flutter ([place]) 10/07/2013  . Pacemaker generator end of life 09/05/2013  . Chest pain at rest 09/05/2013  . Pacemaker - Medtronic Adapta dual chamber Sep 2014, initial implantation 2007 07/30/2013  . GERD 01/15/2011  . DIVERTICULITIS, HX OF 12/09/2010  . POSTMENOPAUSAL STATUS 07/10/2009  . NECK MASS 07/23/2008  . ACUTE BRONCHITIS 11/09/2007  . 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,  status post graft repair the ascending aorta 10/24/2007  . Hyperlipidemia LDL goal <100 05/23/2007  . Anxiety 05/23/2007  . Essential hypertension 05/23/2007    Hilda Blades, PT, DPT, LAT, ATC 12/31/19  1:59 PM Phone: [phone] Fax: [place] [address] [place], [place],  [address] Phone: [phone]   Fax:  [phone]  Name: WYOLENE EBARB MRN: [id number] Date of Birth: 08-Mar-1946

## 2019-12-31 NOTE — Patient Instructions (Addendum)
COVID SCREENING INFORMATION (01/07/2020): You are scheduled for your COVID screening on: February 8 at 12:45PM.  [facility] ([facility]) [address] Stay in the RIGHT lane and proceed under the brick awning and tell them you are there for pre-procedure testing. Do NOT bring any pets with you to the testing site.   CATHETERIZATION INSTRUCTIONS (01/09/2020): You are scheduled for a Cardiac Catheterization on: Wednesday, February 10.  1. Please arrive at the [facility] (Main Entrance A) at [facility]: [place], [place] [address] at: 8:00AM. (This time is two hours before your procedure to ensure your preparation). Free valet parking service is available. You are allowed ONE visitor in the waiting room during your procedures. Both you and your visitor must wear masks. Special note: Every effort is made to have your procedure done on time. Please understand that emergencies sometimes delay scheduled procedures.  2. Diet: Do not eat solid foods after midnight.  You may have clear liquids until 5am upon the day of the procedure.  3. Medication instructions in preparation for your procedure:  1) HOLD LOSARTAN the night before your cath.  2) HOLD LASIX AND POTASSIUM the morning of your cath  3) TAKE ASPIRIN 81 mg the morning of your cath  4) HOLD JARDIANCE the morning of your cath  5) You may take your other medications as directed with sips of water.  4. Plan for one night stay--bring personal belongings. 5. Bring a current list of your medications and current insurance cards. 6. You MUST have a responsible person to drive you home. 7. Someone MUST be with you the first 24 hours after you arrive home or your discharge will be delayed. 8. Please wear clothes that are easy to get on and off and wear slip-on shoes.  Thank you for allowing [name] to care for you!   -- [facility]

## 2019-12-31 NOTE — H&P (View-Only) (Signed)
Cardiology Office Note:    Date:  01/03/2020   ID:  [name], DOB 04/24/1946, MRN [id number]  PCP:  Carollee Herter, Alferd Apa, DO  Cardiologist:  Sanda Klein, MD  Electrophysiologist:  None   Referring MD: Carollee Herter, Alferd Apa, *   Chief Complaint  Patient presents with  . Aortic Stenosis    History of Present Illness:    [name] is a 74 y.o. female with a hx of aortic dissection, referred by Dr Sallyanne Kuster for evaluation of severe aortic stenosis.   The patient is here with her son today. She lives locally with her daughter.  She used to be very active but has been less so of late. She presented initially with a Type B aortic dissection in 2003 and this was managed conservatively. Her second dissection (Type A) required emergency surgical intervention by Dr Servando Snare in 2008. The aorta was replaced with a 28 mm graft from the level of the sinuses to the innominate artery and the aortic valve was resuspended.   She has purposefully lost 60 pounds with dietary changes. She feels fatigued and has progressive exercise intolerance over the past year. She denies chest pain or pressure. She does have episodes of lightheadedness. She had an episode of syncope in July 2020 secondary to sustained monomorphic VT. She underwent upgrade from a pacemaker to an ICD at the time. She has known of a heart murmur for several years now and has been followed closely by Dr Sallyanne Kuster with serial imaging studies. She has no hx of rheumatic fever.  The patient admits to shortness of breath with activity.  She does not have orthopnea or PND. She denies symptoms of leg claudication.  Past Medical History:  Diagnosis Date  . Acute thoracic aortic dissection [facility]) 2008   emergency surgery - [place]  . Anxiety   . Cancer ([place])    skin  . Diabetes mellitus   . Diverticulosis   . GERD (gastroesophageal reflux disease)   . Hemorrhoids   . Hyperlipidemia   . Hypertension   . Kidney stones   . Sinus  node dysfunction (HCC)    hx of PPM    Past Surgical History:  Procedure Laterality Date  . ABDOMINAL HYSTERECTOMY  1984  . APPENDECTOMY    . EYE SURGERY  2014   Rght eye  . ICD IMPLANT N/A 06/28/2019   Procedure: PPM upgrade to ICD;  Surgeon: Evans Lance, MD;  Location: [place] [facility];  Service: Cardiovascular;  Laterality: N/A;  . PACEMAKER GENERATOR CHANGE  2014   Medtronic adapta  . PACEMAKER GENERATOR CHANGE N/A 08/29/2013   Procedure: PACEMAKER GENERATOR CHANGE;  Surgeon: Sanda Klein, MD;  Location: [place] CATH [facility];  Service: Cardiovascular;  Laterality: N/A;  . PACEMAKER INSERTION  08/2006   dual chamber Medtronic EnRhythm; r/t sinus node dysfunction   . REPAIR OF ACUTE ASCENDING THORACIC AORTIC DISSECTION  2008   Dr. Servando Snare  . THORACIC AORTIC ANEURYSM REPAIR  2000   type III  . TRANSTHORACIC ECHOCARDIOGRAM  09/20/2012   EF=>55% with mild conc LVH; LA mod dilated; RA mildly dilated; mild MR/TR/AR    Current Medications: Current Meds  Medication Sig  . carvedilol (COREG) 25 MG tablet TAKE 1 TABLET BY MOUTH TWICE A DAY WITH A MEAL (Patient taking differently: Take 25 mg by mouth 2 (two) times daily with a meal. )  . citalopram (CELEXA) 20 MG tablet TAKE 3 TABLETS BY MOUTH EVERY DAY (Patient taking differently:  Take 60 mg by mouth daily. )  . fluticasone (FLONASE) 50 MCG/ACT nasal spray USE 2 SPRAYS IN EACH NOSTIRL EVERY DAY (Patient taking differently: Place 2 sprays into both nostrils daily as needed for allergies. )  . furosemide (LASIX) 20 MG tablet Take 1 tablet (20 mg total) by mouth daily. Okay to take 1 extra tablet as needed for swelling. (Patient taking differently: Take 20-40 mg by mouth daily. to take 1 extra tablet as needed for swelling.)  . hyoscyamine (LEVSIN SL) 0.125 MG SL tablet TAKE 1 TABLET (0.125 MG TOTAL) BY MOUTH EVERY 6 (SIX) HOURS AS NEEDED. (Patient not taking: Reported on 01/02/2020)  . Omega-3 Fatty Acids (FISH OIL) 1000 MG CAPS Take 1,000 mg by  mouth daily.   Vladimir Faster Glycol-Propyl Glycol (SYSTANE OP) Place 1 drop into both eyes daily as needed (Dry eyes).   . Potassium Chloride ER 20 MEQ TBCR TAKE 1 TABLET BY MOUTH EVERY DAY (Patient taking differently: Take 20 mEq by mouth daily. )  . Probiotic Product (PRO-BIOTIC BLEND PO) Take 1 tablet by mouth daily. [employer] brand  . rosuvastatin (CRESTOR) 20 MG tablet TAKE 1 TABLET BY MOUTH EVERY DAY (Patient taking differently: Take 20 mg by mouth daily. )  . [DISCONTINUED] ALPRAZolam (XANAX) 0.5 MG tablet TAKE 1 TABLET (0.5 MG TOTAL) BY MOUTH 3 (THREE) TIMES DAILY AS NEEDED FOR ANXIETY.  . [DISCONTINUED] cholecalciferol (VITAMIN D) 1000 UNITS tablet Take 1,000 Units by mouth daily.  . [DISCONTINUED] JARDIANCE 10 MG TABS tablet TAKE 1 TABLET(10 MG) BY MOUTH DAILY.   Current Facility-Administered Medications for the 12/31/19 encounter (Office Visit) with Sherren Mocha, MD  Medication  . diclofenac sodium (VOLTAREN) 1 % transdermal gel 2 g     Allergies:   Oxycodone, Vicodin [hydrocodone-acetaminophen], Lidocaine-aloe vera, Latex, Neomycin-bacitracin zn-polymyx, and Sulfonamide derivatives   Social History   Socioeconomic History  . Marital status: Widowed    Spouse name: Not on file  . Number of children: 2  . Years of education: Not on file  . Highest education level: Not on file  Occupational History    Employer: RETIRED  Tobacco Use  . Smoking status: Former Smoker    Quit date: 09/04/1993    Years since quitting: 26.3  . Smokeless tobacco: Never Used  Substance and Sexual Activity  . Alcohol use: No  . Drug use: No  . Sexual activity: Not Currently  Other Topics Concern  . Not on file  Social History Narrative   Lives at home and husband was just moved to [place]   Social Determinants of Health   Financial Resource Strain:   . Difficulty of Paying Living Expenses: Not on file  Food Insecurity:   . Worried About Charity fundraiser in the Last Year: Not on file  .  Ran Out of Food in the Last Year: Not on file  Transportation Needs:   . Lack of Transportation (Medical): Not on file  . Lack of Transportation (Non-Medical): Not on file  Physical Activity:   . Days of Exercise per Week: Not on file  . Minutes of Exercise per Session: Not on file  Stress:   . Feeling of Stress : Not on file  Social Connections:   . Frequency of Communication with Friends and Family: Not on file  . Frequency of Social Gatherings with Friends and Family: Not on file  . Attends Religious Services: Not on file  . Active Member of Clubs or Organizations: Not on file  .  Attends Archivist Meetings: Not on file  . Marital Status: Not on file     Family History: The patient's family history includes Heart attack in her father and maternal grandmother; Kidney disease in her mother; Valvular heart disease in her son. There is no history of Cancer.  ROS:   Please see the history of present illness.    All other systems reviewed and are negative.  EKGs/Labs/Other Studies Reviewed:    The following studies were reviewed today: Carotid US: Summary:  Right Carotid: The extracranial vessels were near-normal with only minimal  wall         thickening or plaque.   Left Carotid: The extracranial vessels were near-normal with only minimal  wall        thickening or plaque.   Vertebrals: Right vertebral artery demonstrates antegrade flow. Left  vertebral        artery was not visualized.  Subclavians: Normal flow hemodynamics were seen in bilateral subclavian        arteries.   Echo 12-19-2019: IMPRESSIONS    1. Left ventricular ejection fraction, by visual estimation, is 40 to  45%. The left ventricle has mild to moderately decreased function. There  is moderately increased left ventricular hypertrophy.  2. Moderately dilated left ventricular internal cavity size.  3. The left ventricle demonstrates regional wall motion  abnormalities.  4. Septal and inferior wall hypokinesis Since July EF has decreased and  AV gradient have increased. Patient may benefit from further evaluation to  include right and left heart cath.  5. Global right ventricle has normal systolic function.The right  ventricular size is normal. No increase in right ventricular wall  thickness.  6. Left atrial size was mildly dilated.  7. Right atrial size was mildly dilated.  8. The mitral valve is normal in structure. Mild mitral valve  regurgitation.  9. The tricuspid valve is normal in structure.  10. The tricuspid valve is normal in structure. Tricuspid valve  regurgitation is moderate.  11. The aortic valve has an indeterminant number of cusps. Aortic valve  regurgitation is mild. Severe aortic valve stenosis.  12. There is severe calcifcation of the aortic valve.  13. There is severe thickening of the aortic valve.  14. Pulmonic regurgitation is mild.  15. The pulmonic valve was grossly normal. Pulmonic valve regurgitation is  mild.  16. Post graft repair for dissection with no residual aneurysm.  17. Moderately elevated pulmonary artery systolic pressure.  18. The interatrial septum was not well visualized.   FINDINGS  Left Ventricle: Left ventricular ejection fraction, by visual estimation,  is 40 to 45%. The left ventricle has mild to moderately decreased  function. The left ventricle demonstrates regional wall motion  abnormalities. The left ventricular internal  cavity size was moderately dilated left ventricle. There is moderately  increased left ventricular hypertrophy. Septal and inferior wall  hypokinesis Since July EF has decreased and AV gradient have increased.  Patient may benefit from further evaluation to  include right and left heart cath.   Right Ventricle: The right ventricular size is normal. No increase in  right ventricular wall thickness. Global RV systolic function is has  normal systolic  function. The tricuspid regurgitant velocity is 3.06 m/s,  and with an assumed right atrial pressure  of 10 mmHg, the estimated right ventricular systolic pressure is  moderately elevated at 47.4 mmHg. AICD wire seen in RA/RV.   Left Atrium: Left atrial size was mildly dilated.   Right  Atrium: Right atrial size was mildly dilated   Pericardium: There is no evidence of pericardial effusion.   Mitral Valve: The mitral valve is normal in structure. There is mild  thickening of the mitral valve leaflet(s). There is mild calcification of  the mitral valve leaflet(s). Mild mitral valve regurgitation.   Tricuspid Valve: The tricuspid valve is normal in structure. Tricuspid  valve regurgitation is moderate.   Aortic Valve: The aortic valve has an indeterminant number of cusps. .  There is severe thickening and severe calcifcation of the aortic valve.  Aortic valve regurgitation is mild. Aortic regurgitation PHT measures 338  msec. Severe aortic stenosis is  present. Moderate aortic valve annular calcification. There is severe  thickening of the aortic valve. There is severe calcifcation of the aortic  valve. Aortic valve mean gradient measures 26.5 mmHg. Aortic valve peak  gradient measures 48.2 mmHg. Aortic  valve area, by VTI measures 0.63 cm. Valve is heavily calcified with DVI  0.2 suggesting severe low gradient low flow AS with mean gradient 27 mmHg  due to low EF.   Pulmonic Valve: The pulmonic valve was grossly normal. Pulmonic valve  regurgitation is mild. Pulmonic regurgitation is mild.   Aorta: The aortic root is normal in size and structure. Post graft repair  for dissection with no residual aneurysm.   IAS/Shunts: The interatrial septum was not well visualized.     LEFT VENTRICLE  PLAX 2D  LVIDd:     5.00 cm Diastology  LVIDs:     3.30 cm LV e' lateral:  9.03 cm/s  LV PW:     1.30 cm LV E/e' lateral: 11.0  LV IVS:    1.60 cm LV e' medial:   4.13 cm/s  LVOT diam:   2.00 cm LV E/e' medial: 24.0  LV SV:     74 ml  LV SV Index:  41.72  LVOT Area:   3.14 cm     RIGHT VENTRICLE      IVC  TAPSE (M-mode): 1.5 cm   IVC diam: 1.00 cm  RVSP:      40.4 mmHg   LEFT ATRIUM       Index    RIGHT ATRIUM      Index  LA diam:    4.20 cm 2.48 cm/m RA Pressure: 3.00 mmHg  LA Vol (A2C):  67.4 ml 39.81 ml/m RA Area:   21.90 cm  LA Vol (A4C):  78.4 ml 46.30 ml/m RA Volume:  70.80 ml 41.81 ml/m  LA Biplane Vol: 75.6 ml 44.65 ml/m  AORTIC VALVE  AV Area (Vmax):  0.58 cm  AV Area (Vmean):  0.62 cm  AV Area (VTI):   0.63 cm  AV Vmax:      347.00 cm/s  AV Vmean:     241.500 cm/s  AV VTI:      0.864 m  AV Peak Grad:   48.2 mmHg  AV Mean Grad:   26.5 mmHg  LVOT Vmax:     63.70 cm/s  LVOT Vmean:    47.500 cm/s  LVOT VTI:     0.173 m  LVOT/AV VTI ratio: 0.20  AI PHT:      338 msec    AORTA  Ao Root diam: 3.50 cm  Ao Asc diam: 2.90 cm   MITRAL VALVE            TRICUSPID VALVE  MV Area (PHT):           TR Peak grad:  37.4 mmHg                   TR Vmax:    312.00 cm/s  MV Decel Time: 164 msec       Estimated RAP: 3.00 mmHg  MV E velocity: 99.20 cm/s 103 cm/s RVSP:      40.4 mmHg  MV A velocity: 72.80 cm/s 70.3 cm/s  MV E/A ratio: 1.36    1.5    SHUNTS                   Systemic VTI: 0.17 m                   Systemic Diam: 2.00 cm   Cardiac CTA: FINDINGS: Aortic Valve: Tricuspid aortic valve. Moderately reduced cusp separation. Moderately thickened, severely calcified aortic valve cusps.  AV calcium score: 1713  Virtual Basal Annulus Measurements:  Maximum/Minimum Diameter: 24.9 x 21.3 mm  Perimeter: 73.1 mm  Area: 414 mm2  Focal calcification at the annulus.  Based on these measurements, the annulus would  be suitable for a 23 mm Edwards Sapien 3 valve.  Sinus of Valsalva Measurements:  Non-coronary: 33 mm  Right - coronary: 34 mm  Left - coronary: 32 mm  Sinus of Valsalva Height:  Left: 17.8 mm  Right: 20.4 mm  Aorta: Previous ascending aorta dissection (Type A) s/p ascending aorta replacement and resuspension of aortic valve. Just distal to the replaced segment, the aorta demonstrates dissection, starting in the distal ascending aorta just proximal to the takeoff of the right brachiocephalic artery and extending through the entire thoracic aorta. May involve the ostium of the right brachiocephalic artery. Extent of dissection described separately on CT Abdomen and Pelvis. On limited review of previous exam, no significant change in appearance or proximal extent of dissection.  Sinotubular Junction: 28 mm  Ascending Thoracic Aorta: 29 mm within graft  Aortic Arch: 18.1 mm true lumen, 12.4 mm false lumen  Descending Thoracic Aorta: Total diameter 33 mm, true lumen 13 mm, false lumen 19 mm  Coronary Artery Height above Annulus:  Left Main: 14.1 mm  Right Coronary: 14 mm  Coronary Arteries: 3 vessel coronary artery calcifications.  Optimum Fluoroscopic Angle for Delivery: LAO 7 CAU 5  Dual chamber ICD noted.  Normal appearing LV apex.  IMPRESSION: 1. Tricuspid aortic valve. Moderately reduced cusp separation. Moderately thickened, severely calcified aortic valve cusps.  2. AV calcium score: 1713  3. Previous ascending aorta dissection (Type A) s/p ascending aorta replacement and resuspension of aortic valve. Just distal to the replaced segment, the aorta demonstrates dissection, starting in the distal ascending aorta just proximal to the takeoff of the right brachiocephalic artery and extending through the entire thoracic aorta. May involve the ostium of the right brachiocephalic artery. Extent of dissection described separately on CT  Abdomen and Pelvis. On limited review of previous exam, no significant change in appearance or proximal extent of dissection.  4.  Adequate coronary heights above the annulus, 14 mm  5. Optimum Fluoroscopic Angle for Delivery: LAO 7 CAU 5  6.  Dual chamber ICD.  CTA Chest, Abd, Pelvis: VASCULAR MEASUREMENTS PERTINENT TO TAVR:  AORTA:  Minimal Aortic Diameter-12.2 x 8.8 mm  Severity of Aortic Calcification-moderate  RIGHT PELVIS:  Right Common Iliac Artery -  Minimal Diameter-10.1 x 9.7 mm  Tortuosity-mild  Calcification-mild  Right External Iliac Artery -  Minimal Diameter-6.9 x 6.5 mm  Tortuosity-mild-to-moderate  Calcification-none  Right Common Femoral Artery -  Minimal Diameter-7.4 x 7.0 mm  Tortuosity-mild  Calcification-mild  LEFT PELVIS:  Left Common Iliac Artery -  Minimal Diameter-9.8 x 7.8 mm (true lumen)  Tortuosity-mild  Calcification-mild  Left External Iliac Artery -  Minimal Diameter-6.6 x 6.5 mm  Tortuosity-mild  Calcification-none  Left Common Femoral Artery -  Minimal Diameter-6.8 x 6.8 mm  Tortuosity-mild  Calcification-none  Review of the MIP images confirms the above findings.  IMPRESSION: 1. Vascular findings and measurements pertinent to potential TAVR procedure, as detailed. 2. Diffuse thickening and calcification of the aortic valve, compatible with the reported history of severe aortic stenosis. 3. Chronic type A aortic dissection status post ascending thoracic aortic repair, with dissection flap extending into left common iliac artery, unchanged. 4. Moderate cardiomegaly. 5. Chronic mild splenomegaly. 6. Right adrenal nodule is stable in size since 06/28/2019 CT, probably a benign adenoma. Follow-up CT abdomen without and with IV contrast in 12 months suggested. This recommendation follows ACR consensus guidelines: Management of Incidental Adrenal Masses: A White Paper  of the ACR Incidental Findings Committee. J Am Coll Radiol 2017;14:1038-1044. 7. Marked diffuse colonic diverticulosis. 8. Small hiatal hernia. 9.  Aortic Atherosclerosis (ICD10-I70.0).  EKG:  EKG is not ordered today.   Recent Labs: 06/28/2019: Magnesium 2.0; TSH 0.893 12/12/2019: ALT 14; Hemoglobin 8.8; Platelets 144 12/31/2019: BUN 22; Creatinine, Ser 1.20; Potassium 4.5; Sodium 138  Recent Lipid Panel    Component Value Date/Time   CHOL 143 06/28/2017 1446   TRIG 107.0 06/28/2017 1446   HDL 51.40 06/28/2017 1446   CHOLHDL 3 06/28/2017 1446   VLDL 21.4 06/28/2017 1446   LDLCALC 70 06/28/2017 1446   LDLDIRECT 217.7 09/13/2011 1535    Physical Exam:    VS:  BP 122/74   Pulse 77   Ht 5' (1.524 m)   Wt 164 lb 6.4 oz (74.6 kg)   SpO2 98%   BMI 32.11 kg/m     Wt Readings from Last 3 Encounters:  12/31/19 164 lb 6.4 oz (74.6 kg)  12/31/19 162 lb (73.5 kg)  11/12/19 159 lb (72.1 kg)     GEN:  Well nourished, well developed in no acute distress HEENT: Normal NECK: No JVD; BL carotid bruits LYMPHATICS: No lymphadenopathy CARDIAC: RRR, 3/6 harsh systolic murmur at the RUSB RESPIRATORY:  Clear to auscultation without rales, wheezing or rhonchi  ABDOMEN: Soft, non-tender, non-distended MUSCULOSKELETAL:  No edema; No deformity  SKIN: Warm and dry NEUROLOGIC:  Alert and oriented x 3 PSYCHIATRIC:  Normal affect   STS Risk Calculator: Isolated AVR: Risk of Mortality: 3.863% Renal Failure: 5.460% Permanent Stroke: 2.900% Prolonged Ventilation: 13.945% DSW Infection: 0.202% Reoperation: 3.639% Morbidity or Mortality: 18.051% Short Length of Stay: 23.214% Long Length of Stay: 12.710%  ASSESSMENT:    1. Severe aortic stenosis    PLAN:    In order of problems listed above:  The patient has severe Stage D2 aortic stenosis (probable low flow low gradient) with NYHA functional class II symptoms of chronic combined systolic and diastolic heart failure. The  patient's echo study is personally reviewed. She has severe calcification of the aortic valve leaflets with associated reduction in leaflet motion. Peak and mean transvalvular gradients are 48 and 27 mmHg, respectively. Dimensionless index is 0.2. LVEF is mildly depressed. Compared to prior echo studies, LVEF is lower and dimensionless aortic valve index is also lower. I have reviewed the natural history of aortic stenosis with the patient and her son who is present today. We have discussed the limitations of medical  therapy and the poor prognosis associated with symptomatic aortic stenosis. We have reviewed potential treatment options, including palliative medical therapy, conventional surgical aortic valve replacement, and transcatheter aortic valve replacement. We discussed treatment options in the context of the patient's specific comorbid medical conditions. Gated cardiac CTA and CTA of the chest, abdomen, and pelvis are reviewed as part of her evaluation. Cardiac anatomy is anatomically suitable for TAVR with a 23 mm Sapien valve. The aortic arch, descending thoracic, and abdominal aorta exhibit changes consistent with her known chronic dissection.   Considering her symptomatic status and presence of LV dysfunction, I agree that it is appropriate to pursue aortic valve replacement. With chronic aortic dissection, and prior surgical repair of type A dissection, TAVR might be the patient's best treatment option associated with lower morbidity. TAVR access is complicated by the presence of chronic dissection. I suspect a transapical approach might be the only safe way to access the patient for TAVR. She will need to undergo right and left heart catheterization prior to proceeding with further multidisciplinary team evaluation. I will plan to access her right radial artery if it is suitable for catheter access. Otherwise I will use a transfemoral approach for cath. The right iliofemoral vessels do not exhibit  findings of chronic dissection. I have reviewed the risks, indications, and alternatives to cardiac catheterization, possible angioplasty, and stenting with the patient. Risks include but are not limited to bleeding, infection, vascular injury, stroke, myocardial infection, arrhythmia, kidney injury, radiation-related injury in the case of prolonged fluoroscopy use, emergency cardiac surgery, and death. The patient understands the risks of serious complication is 1-2 in [id number] with diagnostic cardiac cath and 1-2% or less with angioplasty/stenting. After completion of preoperative studies, the patient will undergo formal surgical consultation as part of a multidisciplinary approach to her care.    Medication Adjustments/Labs and Tests Ordered: Current medicines are reviewed at length with the patient today.  Concerns regarding medicines are outlined above.  No orders of the defined types were placed in this encounter.  No orders of the defined types were placed in this encounter.   Patient Instructions  COVID SCREENING INFORMATION (01/07/2020): You are scheduled for your COVID screening on: February 8 at 12:45PM.  [place] [facility] ([facility]) [address] Stay in the RIGHT lane and proceed under the brick awning and tell them you are there for pre-procedure testing. Do NOT bring any pets with you to the testing site.   CATHETERIZATION INSTRUCTIONS (01/09/2020): You are scheduled for a Cardiac Catheterization on: Wednesday, February 10.  1. Please arrive at the [facility] (Main Entrance A) at [facility]: [place], [place] [address] at: 8:00AM. (This time is two hours before your procedure to ensure your preparation). Free valet parking service is available. You are allowed ONE visitor in the waiting room during your procedures. Both you and your visitor must wear masks. Special note: Every effort is made to have your procedure done on time.  Please understand that emergencies sometimes delay scheduled procedures.  2. Diet: Do not eat solid foods after midnight.  You may have clear liquids until 5am upon the day of the procedure.  3. Medication instructions in preparation for your procedure:  1) HOLD LOSARTAN the night before your cath.  2) HOLD LASIX AND POTASSIUM the morning of your cath  3) TAKE ASPIRIN 81 mg the morning of your cath  4) HOLD JARDIANCE the morning of your cath  5) You  may take your other medications as directed with sips of water.  4. Plan for one night stay--bring personal belongings. 5. Bring a current list of your medications and current insurance cards. 6. You MUST have a responsible person to drive you home. 7. Someone MUST be with you the first 24 hours after you arrive home or your discharge will be delayed. 8. Please wear clothes that are easy to get on and off and wear slip-on shoes.  Thank you for allowing [name] to care for you!   -- [facility]     Signed, Sherren Mocha, MD  01/03/2020 2:22 PM    [place]

## 2019-12-31 NOTE — Progress Notes (Signed)
VASCULAR LAB PRELIMINARY  PRELIMINARY  PRELIMINARY  PRELIMINARY  Carotid duplex completed.    Preliminary report:  See CV proc for preliminary results.  Guerline Happ, RVT 12/31/2019, 8:39 AM

## 2020-01-01 ENCOUNTER — Other Ambulatory Visit (HOSPITAL_COMMUNITY): Payer: Self-pay | Admitting: Internal Medicine

## 2020-01-01 ENCOUNTER — Other Ambulatory Visit: Payer: Self-pay | Admitting: Family Medicine

## 2020-01-01 DIAGNOSIS — F419 Anxiety disorder, unspecified: Secondary | ICD-10-CM

## 2020-01-02 ENCOUNTER — Telehealth: Payer: Self-pay

## 2020-01-02 ENCOUNTER — Encounter: Payer: Medicare HMO | Admitting: Surgery

## 2020-01-02 NOTE — Telephone Encounter (Signed)
Refill sent to Dr. Etter Sjogren

## 2020-01-02 NOTE — Telephone Encounter (Signed)
Requesting: Xanax Contract:  UDS: 10/27/2017 Last OV: 07/03/2019 Next OV: N/A Last Refill: 11/28/2019, #90--0 RF Database:   Please advise

## 2020-01-02 NOTE — Telephone Encounter (Signed)
Spoke with patient to remind of missed remote transmission 

## 2020-01-02 NOTE — Telephone Encounter (Signed)
Patient called in needing a refill on ALPRAZolam [name] 0.5 MG tablet   Per the patient she has called and check with her pharmacy for the last three days and it has not been Refilled. Patient has called the office for two days in a row and she is very upset. Please follow up with the patient as soon as possible at [phone]  Thanks

## 2020-01-07 ENCOUNTER — Telehealth: Payer: Self-pay | Admitting: *Deleted

## 2020-01-07 ENCOUNTER — Other Ambulatory Visit (HOSPITAL_COMMUNITY)
Admission: RE | Admit: 2020-01-07 | Discharge: 2020-01-07 | Disposition: A | Discharge status: Home / Self Care | Payer: Medicare HMO | Source: Ambulatory Visit | Attending: Cardiovascular Disease | Admitting: Cardiovascular Disease

## 2020-01-07 DIAGNOSIS — Z01812 Encounter for preprocedural laboratory examination: Secondary | ICD-10-CM | POA: Diagnosis not present

## 2020-01-07 DIAGNOSIS — Z20822 Contact with and (suspected) exposure to covid-19: Secondary | ICD-10-CM | POA: Diagnosis not present

## 2020-01-07 LAB — SARS CORONAVIRUS 2 (TAT 6-24 HRS): SARS Coronavirus 2: NEGATIVE

## 2020-01-07 NOTE — Telephone Encounter (Addendum)
Pt contacted pre-catheterization scheduled at [facility] for: Wednesday January 09, 2020 1 PM Verified arrival time and place: [place] [facility]) at: 8 AM-pre procedure hydration   No solid food after midnight prior to cath, clear liquids until 5 AM day of procedure. Contrast allergy: no  Hold: Losartan-PM prior to procedure. Lasix/KCl-day before and day of procedure. Jardiance-AM of procedure.  Except hold medications AM meds can be  taken pre-cath with sip of water including: ASA 81 mg   Confirmed patient has responsible adult to drive home post procedure and observe 24 hours after arriving home: yes  Currently, due to Covid-19 pandemic, only one person will be allowed with patient. Must be the same person for patient's entire stay and will be required to wear a mask. They will be asked to wait in the waiting room for the duration of the patient's stay.  Patients are required to wear a mask when they enter the hospital.      COVID-19 Pre-Screening Questions:  . In the past 7 to 10 days have you had a cough,  shortness of breath, headache, congestion, fever (100 or greater) body aches, chills, sore throat, or sudden loss of taste or sense of smell? No  Pt states her daughter tested positive for Covid 12/29/19,  her daughter has been in the other side of the house during this time and  she is improving/recovered. Pt states she received her first Covid-19 vaccine 12/25/19 and is scheduled for second Covid-19 vaccine 01/22/20.                    Pt's 01/07/20 Covid-19 result- negative.

## 2020-01-09 ENCOUNTER — Ambulatory Visit (HOSPITAL_COMMUNITY)
Admission: RE | Admit: 2020-01-09 | Discharge: 2020-01-09 | Disposition: A | Discharge status: Home / Self Care | Payer: Medicare HMO | Attending: Cardiovascular Disease | Admitting: Cardiovascular Disease

## 2020-01-09 ENCOUNTER — Encounter (HOSPITAL_COMMUNITY)
Admission: RE | Disposition: A | Discharge status: Home / Self Care | Payer: Self-pay | Source: Home / Self Care | Attending: Cardiovascular Disease

## 2020-01-09 ENCOUNTER — Other Ambulatory Visit: Payer: Self-pay

## 2020-01-09 DIAGNOSIS — F419 Anxiety disorder, unspecified: Secondary | ICD-10-CM | POA: Insufficient documentation

## 2020-01-09 DIAGNOSIS — I2510 Atherosclerotic heart disease of native coronary artery without angina pectoris: Secondary | ICD-10-CM | POA: Diagnosis not present

## 2020-01-09 DIAGNOSIS — E785 Hyperlipidemia, unspecified: Secondary | ICD-10-CM | POA: Diagnosis not present

## 2020-01-09 DIAGNOSIS — K219 Gastro-esophageal reflux disease without esophagitis: Secondary | ICD-10-CM | POA: Insufficient documentation

## 2020-01-09 DIAGNOSIS — Z885 Allergy status to narcotic agent status: Secondary | ICD-10-CM | POA: Diagnosis not present

## 2020-01-09 DIAGNOSIS — I272 Pulmonary hypertension, unspecified: Secondary | ICD-10-CM | POA: Diagnosis not present

## 2020-01-09 DIAGNOSIS — E119 Type 2 diabetes mellitus without complications: Secondary | ICD-10-CM | POA: Insufficient documentation

## 2020-01-09 DIAGNOSIS — I350 Nonrheumatic aortic (valve) stenosis: Secondary | ICD-10-CM | POA: Diagnosis not present

## 2020-01-09 DIAGNOSIS — Z87891 Personal history of nicotine dependence: Secondary | ICD-10-CM | POA: Diagnosis not present

## 2020-01-09 DIAGNOSIS — I352 Nonrheumatic aortic (valve) stenosis with insufficiency: Secondary | ICD-10-CM | POA: Insufficient documentation

## 2020-01-09 DIAGNOSIS — Z950 Presence of cardiac pacemaker: Secondary | ICD-10-CM | POA: Insufficient documentation

## 2020-01-09 DIAGNOSIS — Z79899 Other long term (current) drug therapy: Secondary | ICD-10-CM | POA: Diagnosis not present

## 2020-01-09 DIAGNOSIS — I495 Sick sinus syndrome: Secondary | ICD-10-CM | POA: Insufficient documentation

## 2020-01-09 DIAGNOSIS — Z882 Allergy status to sulfonamides status: Secondary | ICD-10-CM | POA: Diagnosis not present

## 2020-01-09 DIAGNOSIS — I10 Essential (primary) hypertension: Secondary | ICD-10-CM | POA: Insufficient documentation

## 2020-01-09 HISTORY — PX: RIGHT/LEFT HEART CATH AND CORONARY ANGIOGRAPHY: CATH118266

## 2020-01-09 LAB — POCT I-STAT EG7
Acid-base deficit: 4 mmol/L — ABNORMAL HIGH (ref 0.0–2.0)
Bicarbonate: 22.5 mmol/L (ref 20.0–28.0)
Calcium, Ion: 1.27 mmol/L (ref 1.15–1.40)
HCT: 27 % — ABNORMAL LOW (ref 36.0–46.0)
Hemoglobin: 9.2 g/dL — ABNORMAL LOW (ref 12.0–15.0)
O2 Saturation: 58 %
Potassium: 4.5 mmol/L (ref 3.5–5.1)
Sodium: 142 mmol/L (ref 135–145)
TCO2: 24 mmol/L (ref 22–32)
pCO2, Ven: 45.6 mmHg (ref 44.0–60.0)
pH, Ven: 7.301 (ref 7.250–7.430)
pO2, Ven: 33 mmHg (ref 32.0–45.0)

## 2020-01-09 LAB — BASIC METABOLIC PANEL
Anion gap: 7 (ref 5–15)
BUN: 35 mg/dL — ABNORMAL HIGH (ref 8–23)
CO2: 23 mmol/L (ref 22–32)
Calcium: 9.1 mg/dL (ref 8.9–10.3)
Chloride: 105 mmol/L (ref 98–111)
Creatinine, Ser: 1.47 mg/dL — ABNORMAL HIGH (ref 0.44–1.00)
GFR calc Af Amer: 41 mL/min — ABNORMAL LOW (ref >60)
GFR calc non Af Amer: 35 mL/min — ABNORMAL LOW (ref >60)
Glucose, Bld: 106 mg/dL — ABNORMAL HIGH (ref 70–99)
Potassium: 5.1 mmol/L (ref 3.5–5.1)
Sodium: 135 mmol/L (ref 135–145)

## 2020-01-09 LAB — POCT I-STAT 7, (LYTES, BLD GAS, ICA,H+H)
Acid-base deficit: 3 mmol/L — ABNORMAL HIGH (ref 0.0–2.0)
Bicarbonate: 22.5 mmol/L (ref 20.0–28.0)
Calcium, Ion: 1.35 mmol/L (ref 1.15–1.40)
HCT: 25 % — ABNORMAL LOW (ref 36.0–46.0)
Hemoglobin: 8.5 g/dL — ABNORMAL LOW (ref 12.0–15.0)
O2 Saturation: 99 %
Potassium: 4.6 mmol/L (ref 3.5–5.1)
Sodium: 140 mmol/L (ref 135–145)
TCO2: 24 mmol/L (ref 22–32)
pCO2 arterial: 44 mmHg (ref 32.0–48.0)
pH, Arterial: 7.317 — ABNORMAL LOW (ref 7.350–7.450)
pO2, Arterial: 144 mmHg — ABNORMAL HIGH (ref 83.0–108.0)

## 2020-01-09 LAB — CBC
HCT: 31 % — ABNORMAL LOW (ref 36.0–46.0)
Hemoglobin: 9.9 g/dL — ABNORMAL LOW (ref 12.0–15.0)
MCH: 31 pg (ref 26.0–34.0)
MCHC: 31.9 g/dL (ref 30.0–36.0)
MCV: 97.2 fL (ref 80.0–100.0)
Platelets: 155 10*3/uL (ref 150–400)
RBC: 3.19 MIL/uL — ABNORMAL LOW (ref 3.87–5.11)
RDW: 14.5 % (ref 11.5–15.5)
WBC: 3.9 10*3/uL — ABNORMAL LOW (ref 4.0–10.5)
nRBC: 0 % (ref 0.0–0.2)

## 2020-01-09 LAB — GLUCOSE, CAPILLARY
Glucose-Capillary: 98 mg/dL (ref 70–99)
Glucose-Capillary: 72 mg/dL (ref 70–99)

## 2020-01-09 SURGERY — RIGHT/LEFT HEART CATH AND CORONARY ANGIOGRAPHY
Anesthesia: LOCAL

## 2020-01-09 MED ORDER — ASPIRIN 81 MG PO CHEW: 81.0000 mg | CHEWABLE_TABLET | ORAL | Status: DC

## 2020-01-09 MED ORDER — HEPARIN (PORCINE) IN NACL 1000-0.9 UT/500ML-% IV SOLN
INTRAVENOUS | Status: AC
  Filled 2020-01-09: qty 1000

## 2020-01-09 MED ORDER — LIDOCAINE HCL (PF) 1 % IJ SOLN
INTRAMUSCULAR | Status: DC | PRN
  Administered 2020-01-09: 2 mL via SUBCUTANEOUS
  Administered 2020-01-09: 15 mL via SUBCUTANEOUS
  Administered 2020-01-09: 2 mL via SUBCUTANEOUS

## 2020-01-09 MED ORDER — MIDAZOLAM HCL 2 MG/2ML IJ SOLN
INTRAMUSCULAR | Status: AC
  Filled 2020-01-09: qty 2

## 2020-01-09 MED ORDER — SODIUM CHLORIDE 0.9 % WEIGHT BASED INFUSION
3.0000 mL/kg/h | INTRAVENOUS | Status: AC
  Administered 2020-01-09: 09:00:00 3 mL/kg/h via INTRAVENOUS

## 2020-01-09 MED ORDER — SODIUM CHLORIDE 0.9% FLUSH: 3.0000 mL | INTRAVENOUS | Status: DC | PRN

## 2020-01-09 MED ORDER — SODIUM CHLORIDE 0.9 % WEIGHT BASED INFUSION: 1.0000 mL/kg/h | INTRAVENOUS | Status: DC

## 2020-01-09 MED ORDER — MIDAZOLAM HCL 2 MG/2ML IJ SOLN
INTRAMUSCULAR | Status: DC | PRN
  Administered 2020-01-09 (×2): 2 mg via INTRAVENOUS

## 2020-01-09 MED ORDER — SODIUM CHLORIDE 0.9% FLUSH: 3.0000 mL | Freq: Two times a day (BID) | INTRAVENOUS | Status: DC

## 2020-01-09 MED ORDER — FENTANYL CITRATE (PF) 100 MCG/2ML IJ SOLN
INTRAMUSCULAR | Status: AC
  Filled 2020-01-09: qty 2

## 2020-01-09 MED ORDER — HEPARIN (PORCINE) IN NACL 1000-0.9 UT/500ML-% IV SOLN
INTRAVENOUS | Status: DC | PRN
  Administered 2020-01-09 (×2): 500 mL

## 2020-01-09 MED ORDER — FENTANYL CITRATE (PF) 100 MCG/2ML IJ SOLN
INTRAMUSCULAR | Status: DC | PRN
  Administered 2020-01-09 (×2): 25 ug via INTRAVENOUS

## 2020-01-09 MED ORDER — LIDOCAINE HCL (PF) 1 % IJ SOLN
INTRAMUSCULAR | Status: AC
  Filled 2020-01-09: qty 30

## 2020-01-09 MED ORDER — HYDRALAZINE HCL 20 MG/ML IJ SOLN: 10.0000 mg | INTRAMUSCULAR | Status: DC | PRN

## 2020-01-09 MED ORDER — ONDANSETRON HCL 4 MG/2ML IJ SOLN: 4.0000 mg | Freq: Four times a day (QID) | INTRAMUSCULAR | Status: DC | PRN

## 2020-01-09 MED ORDER — IOHEXOL 350 MG/ML SOLN
INTRAVENOUS | Status: DC | PRN
  Administered 2020-01-09: 15:00:00 30 mL

## 2020-01-09 MED ORDER — VERAPAMIL HCL 2.5 MG/ML IV SOLN
INTRAVENOUS | Status: AC
  Filled 2020-01-09: qty 2

## 2020-01-09 MED ORDER — SODIUM CHLORIDE 0.9 % IV SOLN: 250.0000 mL | INTRAVENOUS | Status: DC | PRN

## 2020-01-09 MED ORDER — HEPARIN SODIUM (PORCINE) 1000 UNIT/ML IJ SOLN
INTRAMUSCULAR | Status: AC
  Filled 2020-01-09: qty 1

## 2020-01-09 MED ORDER — LABETALOL HCL 5 MG/ML IV SOLN: 10.0000 mg | INTRAVENOUS | Status: DC | PRN

## 2020-01-09 SURGICAL SUPPLY — 15 items
CATH BALLN WEDGE 5F 110CM (CATHETERS) ×1 IMPLANT
CATH INFINITI 5FR AL1 (CATHETERS) ×1 IMPLANT
CATH INFINITI 5FR MULTPACK ANG (CATHETERS) ×1 IMPLANT
CLOSURE MYNX CONTROL 5F (Vascular Products) ×1 IMPLANT
GLIDESHEATH SLEND SS 6F .021 (SHEATH) ×1 IMPLANT
GUIDEWIRE INQWIRE 1.5J.035X260 (WIRE) IMPLANT
INQWIRE 1.5J .035X260CM (WIRE) ×2
KIT HEART LEFT (KITS) ×2 IMPLANT
PACK CARDIAC CATHETERIZATION (CUSTOM PROCEDURE TRAY) ×2 IMPLANT
SHEATH GLIDE SLENDER 4/5FR (SHEATH) ×1 IMPLANT
SHEATH PINNACLE 5F 10CM (SHEATH) ×1 IMPLANT
TRANSDUCER W/STOPCOCK (MISCELLANEOUS) ×2 IMPLANT
TUBING CIL FLEX 10 FLL-RA (TUBING) ×2 IMPLANT
WIRE EMERALD 3MM-J .035X150CM (WIRE) ×1 IMPLANT
WIRE EMERALD ST .035X150CM (WIRE) ×1 IMPLANT

## 2020-01-09 NOTE — Progress Notes (Signed)
Thanks, Ronalee Belts. No trouble getting through the aorta, I gather.

## 2020-01-09 NOTE — Discharge Instructions (Signed)
Femoral Site Care This sheet gives you information about how to care for yourself after your procedure. Your health care provider may also give you more specific instructions. If you have problems or questions, contact your health care provider. What can I expect after the procedure? After the procedure, it is common to have:  Bruising that usually fades within 1-2 weeks.  Tenderness at the site. Follow these instructions at home: Wound care  Follow instructions from your health care provider about how to take care of your insertion site. Make sure you: ? Wash your hands with soap and water before you change your bandage (dressing). If soap and water are not available, use hand sanitizer. ? Change your dressing as told by your health care provider. ? Leave stitches (sutures), skin glue, or adhesive strips in place. These skin closures may need to stay in place for 2 weeks or longer. If adhesive strip edges start to loosen and curl up, you may trim the loose edges. Do not remove adhesive strips completely unless your health care provider tells you to do that.  Do not take baths, swim, or use a hot tub until your health care provider approves.  You may shower 24-48 hours after the procedure or as told by your health care provider. ? Gently wash the site with plain soap and water. ? Pat the area dry with a clean towel. ? Do not rub the site. This may cause bleeding.  Do not apply powder or lotion to the site. Keep the site clean and dry.  Check your femoral site every day for signs of infection. Check for: ? Redness, swelling, or pain. ? Fluid or blood. ? Warmth. ? Pus or a bad smell. Activity  For the first 2-3 days after your procedure, or as long as directed: ? Avoid climbing stairs as much as possible. ? Do not squat.  Do not lift anything that is heavier than 10 lb (4.5 kg), or the limit that you are told, until your health care provider says that it is safe.  Rest as  directed. ? Avoid sitting for a long time without moving. Get up to take short walks every 1-2 hours.  Do not drive for 24 hours if you were given a medicine to help you relax (sedative). General instructions  Take over-the-counter and prescription medicines only as told by your health care provider.  Keep all follow-up visits as told by your health care provider. This is important. Contact a health care provider if you have:  A fever or chills.  You have redness, swelling, or pain around your insertion site. Get help right away if:  The catheter insertion area swells very fast.  You pass out.  You suddenly start to sweat or your skin gets clammy.  The catheter insertion area is bleeding, and the bleeding does not stop when you hold steady pressure on the area.  The area near or just beyond the catheter insertion site becomes pale, cool, tingly, or numb. These symptoms may represent a serious problem that is an emergency. Do not wait to see if the symptoms will go away. Get medical help right away. Call your local emergency services (911 in the [place] Do not drive yourself to the hospital. Summary  After the procedure, it is common to have bruising that usually fades within 1-2 weeks.  Check your femoral site every day for signs of infection.  Do not lift anything that is heavier than 10 lb (4.5 kg), or the   limit that you are told, until your health care provider says that it is safe. This information is not intended to replace advice given to you by your health care provider. Make sure you discuss any questions you have with your health care provider. Document Revised: 11/28/2017 Document Reviewed: 11/28/2017 [employer]vier Patient Education  2020 [employer]  

## 2020-01-09 NOTE — Progress Notes (Signed)
Discharge instructions reviewed with pt and her cousin (via telephone)

## 2020-01-09 NOTE — Interval H&P Note (Signed)
History and Physical Interval Note:  01/09/2020 1:34 PM  [name]  has presented today for surgery, with the diagnosis of aortic stenosis.  The various methods of treatment have been discussed with the patient and family. After consideration of risks, benefits and other options for treatment, the patient has consented to  Procedure(s): RIGHT/LEFT HEART CATH AND CORONARY ANGIOGRAPHY (N/A) as a surgical intervention.  The patient's history has been reviewed, patient examined, no change in status, stable for surgery.  I have reviewed the patient's chart and labs.  Questions were answered to the patient's satisfaction.     Sherren Mocha

## 2020-01-10 MED FILL — Verapamil HCl IV Soln 2.5 MG/ML: INTRAVENOUS | Qty: 2 | Status: AC

## 2020-01-13 ENCOUNTER — Encounter: Payer: Self-pay | Admitting: Physician Assistant

## 2020-01-13 DIAGNOSIS — N183 Chronic kidney disease, stage 3 unspecified: Secondary | ICD-10-CM | POA: Insufficient documentation

## 2020-01-15 ENCOUNTER — Institutional Professional Consult (permissible substitution): Payer: Medicare HMO | Admitting: Surgery

## 2020-01-15 ENCOUNTER — Encounter: Payer: Self-pay | Admitting: Surgery

## 2020-01-15 ENCOUNTER — Other Ambulatory Visit: Payer: Self-pay

## 2020-01-15 VITALS — BP 120/70 | HR 71 | Temp 97.6°F | Resp 20 | Ht 60.0 in | Wt 162.0 lb

## 2020-01-15 DIAGNOSIS — I350 Nonrheumatic aortic (valve) stenosis: Secondary | ICD-10-CM

## 2020-01-15 NOTE — Progress Notes (Signed)
Patient ID: [name], female   DOB: Sep 13, 1946, 74 y.o.   MRN: [id number]  [facility] CONSULTATION REPORT  Referring Provider is Croitoru, Dani Gobble, MD Primary Cardiologist is Sanda Klein, MD PCP is Carollee Herter, Alferd Apa, DO  Chief Complaint  Patient presents with  . Aortic Stenosis    TAVR eval, review all studies    HPI:  The patient is a 74 year old woman with history of diabetes hypertension, hyperlipidemia, sinus node dysfunction status post permanent pacemaker in the past, type B aortic dissection in 2003 treated with medical therapy, type a aortic dissection in 2008 treated by Dr. Servando Snare with replacement of the ascending aorta from the supra coronary level to the innominate artery using a 28 mm graft and aortic valve resuspension, sustained VT with syncope in July 2020 with subsequent upgrade of her pacemaker to an ICD as well as known aortic stenosis that has been followed by Dr. Sallyanne Kuster.  She has a history of progressive exertional fatigue and shortness of breath over the past 6 to 12 months which continues to worsen.  Her most recent echocardiogram on 12/19/2019 shows reduction in her ejection fraction to 40 to 45% which was felt to be due to RV apical pacing.  She has moderate concentric left ventricular hypertrophy.  The left ventricular internal cavity is moderately dilated.  The aortic valve has an indeterminate number of cusps with severe thickening and calcification.  There is mild aortic insufficiency.  The mean aortic valve gradient is 26.5 mmHg with an aortic valve area of 0.63 cm.  Dimensionless index of 0.2 suggesting a severe, low flow, low gradient aortic stenosis.  She is here today with her son.  She lives with her daughter.  She has shortness of breath and fatigue while doing any housework.  She has had no further dizziness or syncope.  She has had some swelling in her legs which is  improved with diuretic.  Past Medical History:  Diagnosis Date  . Acute thoracic aortic dissection [facility]) 2008   emergency surgery - [place]  . Anxiety   . Cancer ([place])    skin  . CKD (chronic kidney disease) stage 3, GFR 30-59 ml/min   . Diabetes mellitus   . Diverticulosis   . GERD (gastroesophageal reflux disease)   . Hemorrhoids   . Hyperlipidemia   . Hypertension   . Kidney stones   . Severe aortic stenosis   . Sinus node dysfunction ([facility])    hx of PPM  . Sustained ventricular tachycardia ([place])    s/p ICD 05/2019    Past Surgical History:  Procedure Laterality Date  . ABDOMINAL HYSTERECTOMY  1984  . APPENDECTOMY    . EYE SURGERY  2014   Rght eye  . ICD IMPLANT N/A 06/28/2019   Procedure: PPM upgrade to ICD;  Surgeon: Evans Lance, MD;  Location: [facility];  Service: Cardiovascular;  Laterality: N/A;  . PACEMAKER GENERATOR CHANGE  2014   Medtronic adapta  . PACEMAKER GENERATOR CHANGE N/A 08/29/2013   Procedure: PACEMAKER GENERATOR CHANGE;  Surgeon: Sanda Klein, MD;  Location: [facility] CATH LAB;  Service: Cardiovascular;  Laterality: N/A;  . PACEMAKER INSERTION  08/2006   dual chamber Medtronic EnRhythm; r/t sinus node dysfunction   . REPAIR OF ACUTE ASCENDING THORACIC AORTIC DISSECTION  2008   Dr. Servando Snare  . RIGHT/LEFT HEART CATH AND CORONARY ANGIOGRAPHY N/A 01/09/2020   Procedure: RIGHT/LEFT HEART CATH AND CORONARY  ANGIOGRAPHY;  Surgeon: Sherren Mocha, MD;  Location: [facility];  Service: Cardiovascular;  Laterality: N/A;  . THORACIC AORTIC ANEURYSM REPAIR  2000   type III  . TRANSTHORACIC ECHOCARDIOGRAM  09/20/2012   EF=>55% with mild conc LVH; LA mod dilated; RA mildly dilated; mild MR/TR/AR    Family History  Problem Relation Age of Onset  . Kidney disease Mother   . Heart attack Father   . Heart attack Maternal Grandmother   . Valvular heart disease Son        valve replacement at 47  . Cancer Neg Hx     Social History    Socioeconomic History  . Marital status: Widowed    Spouse name: Not on file  . Number of children: 2  . Years of education: Not on file  . Highest education level: Not on file  Occupational History    Employer: RETIRED  Tobacco Use  . Smoking status: Former Smoker    Quit date: 09/04/1993    Years since quitting: 26.3  . Smokeless tobacco: Never Used  Substance and Sexual Activity  . Alcohol use: No  . Drug use: No  . Sexual activity: Not Currently  Other Topics Concern  . Not on file  Social History Narrative   Lives at home and husband was just moved to [place]   Social Determinants of Health   Financial Resource Strain:   . Difficulty of Paying Living Expenses: Not on file  Food Insecurity:   . Worried About Charity fundraiser in the Last Year: Not on file  . Ran Out of Food in the Last Year: Not on file  Transportation Needs:   . Lack of Transportation (Medical): Not on file  . Lack of Transportation (Non-Medical): Not on file  Physical Activity:   . Days of Exercise per Week: Not on file  . Minutes of Exercise per Session: Not on file  Stress:   . Feeling of Stress : Not on file  Social Connections:   . Frequency of Communication with Friends and Family: Not on file  . Frequency of Social Gatherings with Friends and Family: Not on file  . Attends Religious Services: Not on file  . Active Member of Clubs or Organizations: Not on file  . Attends Archivist Meetings: Not on file  . Marital Status: Not on file  Intimate Partner Violence:   . Fear of Current or Ex-Partner: Not on file  . Emotionally Abused: Not on file  . Physically Abused: Not on file  . Sexually Abused: Not on file    Current Outpatient Medications  Medication Sig Dispense Refill  . acetaminophen (TYLENOL) 500 MG tablet Take 1,000 mg by mouth every 6 (six) hours as needed for headache.    . ALPRAZolam (XANAX) 0.5 MG tablet TAKE 1 TABLET (0.5 MG TOTAL) BY MOUTH 3 (THREE)  TIMES DAILY AS NEEDED FOR ANXIETY. 90 tablet 0  . Ascorbic Acid (VITAMIN C) 1000 MG tablet Take 1,000 mg by mouth daily.    . carvedilol (COREG) 25 MG tablet TAKE 1 TABLET BY MOUTH TWICE A DAY WITH A MEAL (Patient taking differently: Take 25 mg by mouth 2 (two) times daily with a meal. ) 180 tablet 1  . citalopram (CELEXA) 20 MG tablet TAKE 3 TABLETS BY MOUTH EVERY DAY (Patient taking differently: Take 60 mg by mouth daily. ) 270 tablet 9  . ferrous sulfate 325 (65 FE) MG tablet Take 325 mg by mouth  daily with breakfast.    . fluticasone (FLONASE) 50 MCG/ACT nasal spray USE 2 SPRAYS IN EACH NOSTIRL EVERY DAY (Patient taking differently: Place 2 sprays into both nostrils daily as needed for allergies. ) 16 g 1  . furosemide (LASIX) 20 MG tablet Take 1 tablet (20 mg total) by mouth daily. Okay to take 1 extra tablet as needed for swelling. (Patient taking differently: Take 20-40 mg by mouth daily. to take 1 extra tablet as needed for swelling.) 90 tablet 3  . hyoscyamine (LEVSIN SL) 0.125 MG SL tablet TAKE 1 TABLET (0.125 MG TOTAL) BY MOUTH EVERY 6 (SIX) HOURS AS NEEDED. 60 tablet 1  . JARDIANCE 10 MG TABS tablet TAKE 1 TABLET(10 MG) BY MOUTH DAILY. (Patient taking differently: Take 10 mg by mouth daily. ) 30 tablet 3  . loperamide (IMODIUM) 2 MG capsule Take 2 mg by mouth as needed for diarrhea or loose stools.    . Omega-3 Fatty Acids (FISH OIL) 1000 MG CAPS Take 1,000 mg by mouth daily.     Vladimir Faster Glycol-Propyl Glycol (SYSTANE OP) Place 1 drop into both eyes daily as needed (Dry eyes).     . Potassium Chloride ER 20 MEQ TBCR TAKE 1 TABLET BY MOUTH EVERY DAY (Patient taking differently: Take 20 mEq by mouth daily. ) 90 tablet 4  . Probiotic Product (PRO-BIOTIC BLEND PO) Take 1 tablet by mouth daily. [employer] brand    . rosuvastatin (CRESTOR) 20 MG tablet TAKE 1 TABLET BY MOUTH EVERY DAY (Patient taking differently: Take 20 mg by mouth daily. ) 90 tablet 1  . losartan (COZAAR) 25 MG tablet Take 0.5  tablets (12.5 mg total) by mouth at bedtime. 90 tablet 3   Current Facility-Administered Medications  Medication Dose Route Frequency Provider Last Rate Last Admin  . diclofenac sodium (VOLTAREN) 1 % transdermal gel 2 g  2 g Topical QID Carollee Herter, Kendrick Fries R, DO        Allergies  Allergen Reactions  . Oxycodone Itching  . Vicodin [Hydrocodone-Acetaminophen] Itching  . Lidocaine-Aloe Vera Other (See Comments)    Felt like bees stinging her  . Latex Rash    Itching/rash   . Neomycin-Bacitracin Zn-Polymyx Other (See Comments)    Red , swollen eye  . Sulfonamide Derivatives Other (See Comments)    Just had reaction when taking during pregnancy- last child birth 72      Review of Systems:   General:  normal appetite, + decreased energy, no weight gain, + 60 lb intentional weight loss, no fever  Cardiac:  no chest pain with exertion, no chest pain at rest, +SOB with mild exertion, no resting SOB, no PND, no orthopnea, no palpitations, + arrhythmia, + atrial fibrillation, + LE edema, no dizzy spells, no syncope  Respiratory:  + exertional shortness of breath, no home oxygen, no productive cough, no dry cough, no bronchitis, no wheezing, no hemoptysis, no asthma, no pain with inspiration or cough, no sleep apnea, no CPAP at night  GI:   no difficulty swallowing, no reflux, no frequent heartburn, no hiatal hernia, no abdominal pain, no constipation, no diarrhea, no hematochezia, no hematemesis, no melena  GU:   no dysuria,  no frequency, no urinary tract infection, no hematuria, no kidney stones, no kidney disease  Vascular:  no pain suggestive of claudication, no pain in feet, no leg cramps, no varicose veins, no DVT, no non-healing foot ulcer  Neuro:   no stroke, no TIA's, no seizures, no headaches, no temporary  blindness one eye,  no slurred speech, no peripheral neuropathy, no chronic pain, + instability of gait, no memory/cognitive dysfunction  Musculoskeletal: no arthritis, no joint  swelling, no myalgias, + difficulty walking, + reduced mobility   Skin:   no rash, no itching, no skin infections, no pressure sores or ulcerations  Psych:   + anxiety, no depression, no nervousness, no unusual recent stress  Eyes:   no blurry vision, no floaters, no recent vision changes, + wears glasses or contacts  ENT:   no hearing loss, no loose or painful teeth, o dentures, last saw dentist 1 year ago.  Hematologic:  no easy bruising, no abnormal bleeding, no clotting disorder, no frequent epistaxis  Endocrine:  + diabetes, does  check CBG's at home     Physical Exam:   BP 120/70   Pulse 71   Temp 97.6 F (36.4 C) (Skin)   Resp 20   Ht 5' (1.524 m)   Wt 162 lb (73.5 kg)   SpO2 94% Comment: RA  BMI 31.64 kg/m   General:  Elderly,  well-appearing  HEENT:  Unremarkable, NCAT, PERLA  Neck:   no JVD, no bruits, no adenopathy or thyromegaly  Chest:   clear to auscultation, symmetrical breath sounds, no wheezes, no rhonchi   CV:   RRR, grade lll/VI crescendo/decrescendo murmur heard best at RSB,  no diastolic murmur  Abdomen:  soft, non-tender, no masses   Extremities:  warm, well-perfused, pulses palpable, mild LE edema in ankles and feet  Rectal/GU  Deferred  Neuro:   Grossly non-focal and symmetrical throughout  Skin:   Clean and dry, no rashes, no breakdown   Diagnostic Tests:  ECHOCARDIOGRAM REPORT       Patient Name:  [name] Date of Exam: 12/19/2019  Medical Rec #: [id number]    Height:    60.0 in  Accession #:  [id number]   Weight:    159.0 lb  Date of Birth: 22-Aug-1946    BSA:     1.69 m  Patient Age:  58 years    BP:      122/64 mmHg  Patient Gender: F        HR:      67 bpm.  Exam Location: [address]   Procedure: 2D Echo, Cardiac Doppler and Color Doppler   Indications:  I35.0    History:    Patient has prior history of Echocardiogram examinations,  most         recent  06/28/2019. Defibrillator, AS,  Signs/Symptoms:Shortness of         Breath; Risk Factors:Hypertension, Dyslipidemia, Former  Smoker         and Diabetes.    Sonographer:  Coralyn Helling RDCS  Referring Phys: [place]    1. Left ventricular ejection fraction, by visual estimation, is 40 to  45%. The left ventricle has mild to moderately decreased function. There  is moderately increased left ventricular hypertrophy.  2. Moderately dilated left ventricular internal cavity size.  3. The left ventricle demonstrates regional wall motion abnormalities.  4. Septal and inferior wall hypokinesis Since July EF has decreased and  AV gradient have increased. Patient may benefit from further evaluation to  include right and left heart cath.  5. Global right ventricle has normal systolic function.The right  ventricular size is normal. No increase in right ventricular wall  thickness.  6. Left atrial size was mildly dilated.  7. Right atrial size was mildly  dilated.  8. The mitral valve is normal in structure. Mild mitral valve  regurgitation.  9. The tricuspid valve is normal in structure.  10. The tricuspid valve is normal in structure. Tricuspid valve  regurgitation is moderate.  11. The aortic valve has an indeterminant number of cusps. Aortic valve  regurgitation is mild. Severe aortic valve stenosis.  12. There is severe calcifcation of the aortic valve.  13. There is severe thickening of the aortic valve.  14. Pulmonic regurgitation is mild.  15. The pulmonic valve was grossly normal. Pulmonic valve regurgitation is  mild.  16. Post graft repair for dissection with no residual aneurysm.  17. Moderately elevated pulmonary artery systolic pressure.  18. The interatrial septum was not well visualized.   FINDINGS  Left Ventricle: Left ventricular ejection fraction, by visual estimation,  is 40 to 45%. The left ventricle has mild  to moderately decreased  function. The left ventricle demonstrates regional wall motion  abnormalities. The left ventricular internal  cavity size was moderately dilated left ventricle. There is moderately  increased left ventricular hypertrophy. Septal and inferior wall  hypokinesis Since July EF has decreased and AV gradient have increased.  Patient may benefit from further evaluation to  include right and left heart cath.   Right Ventricle: The right ventricular size is normal. No increase in  right ventricular wall thickness. Global RV systolic function is has  normal systolic function. The tricuspid regurgitant velocity is 3.06 m/s,  and with an assumed right atrial pressure  of 10 mmHg, the estimated right ventricular systolic pressure is  moderately elevated at 47.4 mmHg. AICD wire seen in RA/RV.   Left Atrium: Left atrial size was mildly dilated.   Right Atrium: Right atrial size was mildly dilated   Pericardium: There is no evidence of pericardial effusion.   Mitral Valve: The mitral valve is normal in structure. There is mild  thickening of the mitral valve leaflet(s). There is mild calcification of  the mitral valve leaflet(s). Mild mitral valve regurgitation.   Tricuspid Valve: The tricuspid valve is normal in structure. Tricuspid  valve regurgitation is moderate.   Aortic Valve: The aortic valve has an indeterminant number of cusps. .  There is severe thickening and severe calcifcation of the aortic valve.  Aortic valve regurgitation is mild. Aortic regurgitation PHT measures 338  msec. Severe aortic stenosis is  present. Moderate aortic valve annular calcification. There is severe  thickening of the aortic valve. There is severe calcifcation of the aortic  valve. Aortic valve mean gradient measures 26.5 mmHg. Aortic valve peak  gradient measures 48.2 mmHg. Aortic  valve area, by VTI measures 0.63 cm. Valve is heavily calcified with DVI  0.2 suggesting severe low  gradient low flow AS with mean gradient 27 mmHg  due to low EF.   Pulmonic Valve: The pulmonic valve was grossly normal. Pulmonic valve  regurgitation is mild. Pulmonic regurgitation is mild.   Aorta: The aortic root is normal in size and structure. Post graft repair  for dissection with no residual aneurysm.   IAS/Shunts: The interatrial septum was not well visualized.     LEFT VENTRICLE  PLAX 2D  LVIDd:     5.00 cm Diastology  LVIDs:     3.30 cm LV e' lateral:  9.03 cm/s  LV PW:     1.30 cm LV E/e' lateral: 11.0  LV IVS:    1.60 cm LV e' medial:  4.13 cm/s  LVOT diam:   2.00 cm  LV E/e' medial: 24.0  LV SV:     74 ml  LV SV Index:  41.72  LVOT Area:   3.14 cm     RIGHT VENTRICLE      IVC  TAPSE (M-mode): 1.5 cm   IVC diam: 1.00 cm  RVSP:      40.4 mmHg   LEFT ATRIUM       Index    RIGHT ATRIUM      Index  LA diam:    4.20 cm 2.48 cm/m RA Pressure: 3.00 mmHg  LA Vol (A2C):  67.4 ml 39.81 ml/m RA Area:   21.90 cm  LA Vol (A4C):  78.4 ml 46.30 ml/m RA Volume:  70.80 ml 41.81 ml/m  LA Biplane Vol: 75.6 ml 44.65 ml/m  AORTIC VALVE  AV Area (Vmax):  0.58 cm  AV Area (Vmean):  0.62 cm  AV Area (VTI):   0.63 cm  AV Vmax:      347.00 cm/s  AV Vmean:     241.500 cm/s  AV VTI:      0.864 m  AV Peak Grad:   48.2 mmHg  AV Mean Grad:   26.5 mmHg  LVOT Vmax:     63.70 cm/s  LVOT Vmean:    47.500 cm/s  LVOT VTI:     0.173 m  LVOT/AV VTI ratio: 0.20  AI PHT:      338 msec    AORTA  Ao Root diam: 3.50 cm  Ao Asc diam: 2.90 cm   MITRAL VALVE            TRICUSPID VALVE  MV Area (PHT):           TR Peak grad:  37.4 mmHg                   TR Vmax:    312.00 cm/s  MV Decel Time: 164 msec       Estimated RAP: 3.00 mmHg  MV E velocity: 99.20 cm/s 103 cm/s RVSP:      40.4 mmHg  MV A velocity:  72.80 cm/s 70.3 cm/s  MV E/A ratio: 1.36    1.5    SHUNTS                   Systemic VTI: 0.17 m                   Systemic Diam: 2.00 cm     Jenkins Rouge MD  Electronically signed by Jenkins Rouge MD  Signature Date/Time: 12/19/2019/11:48:37 AM     Physicians Panel Physicians Referring Physician Case Authorizing Physician  Sherren Mocha, MD (Primary)       Procedures RIGHT/LEFT HEART CATH AND CORONARY ANGIOGRAPHY     Conclusion 1. Widely patent coronary arteries with minimal nonobstructive RCA stenosis  2. Severe aortic stenosis with mean transvalvular gradient 26 mmHg and calculated valve area 0.8 cm  3. Mild pulmonary hypertension with mean PA pressure 31 mmHg  Plan: Continue TAVR evaluation           Indications Severe aortic stenosis [I35.0 (ICD-10-CM)]     Procedural Details Technical Details INDICATION: Severe, stage D3 (paradoxical low flow low gradient) aortic stenosis. Complex 74 year old patient has a remote history of type B aortic dissection and later suffered a type a dissection requiring emergency surgery. She has a chronic dissection throughout the thoracoabdominal aorta even involving the aortic arch. The ascending aortic repair is stable. She presents for right and left  heart catheterization for further evaluation and preparation of TAVR.  PROCEDURAL DETAILS: There was an indwelling IV in a right antecubital vein. Using normal sterile technique, the IV was changed out for a 5 Fr brachial sheath over a 0.018 inch wire. The right wrist was then prepped, draped, and anesthetized with 1% lidocaine. Attempts were made at accessing the ulnar artery using direct ultrasound guidance. The radial artery was too small to cannulate. Made multiple attempts to cannulate the ulnar artery but the artery continually rolled every time a needle approach to it. After 3 or 4 attempts, attention was turned to the left groin. Using  direct ultrasound guidance and fluoroscopic landmarks, the left femoral artery is accessed via a front wall puncture. A 5 French sheath is inserted. Using caution, a wire was advanced into the aortic root under fluoroscopic guidance. All catheter exchanges were made over an exchange length wire to avoid having to rewire the aorta. Standard Judkins catheters were used for coronary angiography. An AL-1 catheter was used to direct a straight-tip wire across the aortic valve and pullback pressures were recorded. A Swan-Ganz catheter was used for the right heart catheterization. Standard protocol was followed for recording of right heart pressures and sampling of oxygen saturations. Fick cardiac output was calculated. A mynx device is used for femoral hemostasis. There were no immediate procedural complications. The patient was transferred to the post catheterization recovery area for further monitoring.    Estimated blood loss <50 mL.   During this procedure medications were administered to achieve and maintain moderate conscious sedation while the patient's heart rate, blood pressure, and oxygen saturation were continuously monitored and I was present face-to-face 100% of this time.     Medications (Filter: Administrations occurring from 01/09/20 1312 to 01/09/20 1445)  Continuous medications are totaled by the amount administered until 01/09/20 1445.  fentaNYL (SUBLIMAZE) injection (mcg) Total dose: 50 mcg  Date/Time   Rate/Dose/Volume Action  01/09/20 1328  25 mcg Given  1408  25 mcg Given  midazolam (VERSED) injection (mg) Total dose: 4 mg  Date/Time   Rate/Dose/Volume Action  01/09/20 1328  2 mg Given  1408  2 mg Given  lidocaine (PF) (XYLOCAINE) 1 % injection (mL) Total volume: 19 mL  Date/Time   Rate/Dose/Volume Action  01/09/20 1343  2 mL Given  1347  2 mL Given  1408  15 mL Given  iohexol (OMNIPAQUE) 350 MG/ML injection (mL) Total volume: 30 mL  Date/Time   Rate/Dose/Volume  Action  01/09/20 1443  30 mL Given  Heparin (Porcine) in NaCl 1000-0.9 UT/500ML-% SOLN (mL) Total volume: 1,000 mL  Date/Time   Rate/Dose/Volume Action  01/09/20 1444  500 mL Given  1444  500 mL Given     Sedation Time Sedation Time Physician-1: 1 hour 3 minutes 45 seconds        Contrast Medication Name Total Dose  iohexol (OMNIPAQUE) 350 MG/ML injection 30 mL     Radiation/Fluoro Fluoro time: 5.5 (min)  DAP: 16336 (mGycm2)  Cumulative Air Kerma: 267 (mGy)        Coronary Findings Diagnostic Dominance: Right  Left Main  Vessel is angiographically normal.   Left Anterior Descending  The vessel exhibits minimal luminal irregularities. The LAD is patent to the apex. The diagonal branches are patent. There are no significant stenoses throughout the LAD territory.   Left Circumflex  Vessel is angiographically normal.   Right Coronary Artery  The vessel exhibits minimal luminal irregularities. Large, dominant vessel. There is mild  nonobstructive stenosis at the junction of the mid to distal vessel, no more than 25 to 30% narrowed. The vessel supplies a PDA and PLA branch.  Intervention No interventions have been documented.                         Left Heart Aortic Valve There is severe aortic valve stenosis. The aortic valve is calcified. Peak to peak gradient 30 mmHg, mean gradient 26 mmHg, calculated valve area 0.8 cm     Coronary Diagrams Diagnostic Dominance: Right  &&&&&  Intervention      Implants  Vascular Products  Closure Mynx Control 48f [id number] - Implanted  Inventory item: CLOSURE [facility] CONTROL 7F Model/Cat number: [id number]  Manufacturer: [place] Lot number: [id number]  Device identifier: [id number] Device identifier type: GS1  GUDID Information   Request status Successful     Brand name: MYNX CONTROL Version/Model: [id number]   Company name: [employer]. MRI safety info as of 01/09/20: MR Safe   Contains  dry or latex rubber: No     GMDN P.T. name: Wound hydrogel dressing, non-antimicrobial     As of 01/09/2020    Status: Implanted          [employer] Images Link to Procedure Log  Show images for CARDIAC CATHETERIZATION Procedure Log     Images on Long Term Storage   Show images for [place], [place]         Hemo Data   Most Recent Value  Fick Cardiac Output 4.12 L/min  Fick Cardiac Output Index 2.41 (L/min)/BSA  Aortic Mean Gradient 25.5 mmHg  Aortic Peak Gradient 30 mmHg  Aortic Valve Area 0.80  Aortic Value Area Index 0.47 cm2/BSA  RA A Wave 8 mmHg  RA V Wave 11 mmHg  RA Mean 8 mmHg  RV Systolic Pressure 50 mmHg  RV Diastolic Pressure 5 mmHg  RV EDP 9 mmHg  PA Systolic Pressure 55 mmHg  PA Diastolic Pressure 21 mmHg  PA Mean 33 mmHg  PW A Wave 19 mmHg  PW V Wave 22 mmHg  PW Mean 16 mmHg  AO Systolic Pressure 123456 mmHg  AO Diastolic Pressure 59 mmHg  AO Mean 92 mmHg  LV Systolic Pressure [id number] mmHg  LV Diastolic Pressure 9 mmHg  LV EDP 19 mmHg  AOp Systolic Pressure 0000000 mmHg  AOp Diastolic Pressure 54 mmHg  AOp Mean Pressure 86 mmHg  LVp Systolic Pressure [id number] mmHg  LVp Diastolic Pressure 7 mmHg  LVp EDP Pressure 17 mmHg  QP/QS 1  TPVR Index 12.88 HRUI  TSVR Index 38.23 HRUI  PVR SVR Ratio 0.18  TPVR/TSVR Ratio 0.34    ADDENDUM REPORT: 12/31/2019 16:26  EXAM: OVER-READ INTERPRETATION  CT CHEST  The following report is an over-read performed by radiologist Dr. Samara Snide [facility] on 12/31/2019. This over-read does not include interpretation of cardiac or coronary anatomy or pathology. The coronary CTA interpretation by the cardiologist is attached.  COMPARISON:  06/28/2019 chest CT angiogram.  FINDINGS: Please see the separate concurrent chest CT angiogram report for details.  IMPRESSION: Please see the separate concurrent chest CT angiogram report for details.   Electronically Signed   By: Ilona Sorrel M.D.   On: 12/31/2019  16:26   Addended by Sharyn Blitz, MD on 12/31/2019 5:02 PM    Study Result  CLINICAL DATA:  Aortic stenosis  EXAM: Cardiac TAVR CT  TECHNIQUE: The patient was  scanned on a [id number] slice scanner. A 120 kV retrospective scan was triggered in the descending thoracic aorta at 111 HU's. Gantry rotation speed was 270 msecs and collimation was .9 mm. No beta blockade or nitro were given. The 3D data set was reconstructed in 5% intervals of the R-R cycle. Systolic and diastolic phases were analyzed on a dedicated work station using MPR, MIP and VRT modes. The patient received 132mL OMNIPAQUE IOHEXOL 350 MG/ML SOLN of contrast.  FINDINGS: Aortic Valve: Tricuspid aortic valve. Moderately reduced cusp separation. Moderately thickened, severely calcified aortic valve cusps.  AV calcium score: 1713  Virtual Basal Annulus Measurements:  Maximum/Minimum Diameter: 24.9 x 21.3 mm  Perimeter: 73.1 mm  Area: 414 mm2  Focal calcification at the annulus.  Based on these measurements, the annulus would be suitable for a 23 mm Edwards Sapien 3 valve.  Sinus of Valsalva Measurements:  Non-coronary: 33 mm  Right - coronary: 34 mm  Left - coronary: 32 mm  Sinus of Valsalva Height:  Left: 17.8 mm  Right: 20.4 mm  Aorta: Previous ascending aorta dissection (Type A) s/p ascending aorta replacement and resuspension of aortic valve. Just distal to the replaced segment, the aorta demonstrates dissection, starting in the distal ascending aorta just proximal to the takeoff of the right brachiocephalic artery and extending through the entire thoracic aorta. May involve the ostium of the right brachiocephalic artery. Extent of dissection described separately on CT Abdomen and Pelvis. On limited review of previous exam, no significant change in appearance or proximal extent of dissection.  Sinotubular Junction: 28 mm  Ascending Thoracic Aorta: 29  mm within graft  Aortic Arch: 18.1 mm true lumen, 12.4 mm false lumen  Descending Thoracic Aorta: Total diameter 33 mm, true lumen 13 mm, false lumen 19 mm  Coronary Artery Height above Annulus:  Left Main: 14.1 mm  Right Coronary: 14 mm  Coronary Arteries: 3 vessel coronary artery calcifications.  Optimum Fluoroscopic Angle for Delivery: LAO 7 CAU 5  Dual chamber ICD noted.  Normal appearing LV apex.  IMPRESSION: 1. Tricuspid aortic valve. Moderately reduced cusp separation. Moderately thickened, severely calcified aortic valve cusps.  2. AV calcium score: 1713  3. Previous ascending aorta dissection (Type A) s/p ascending aorta replacement and resuspension of aortic valve. Just distal to the replaced segment, the aorta demonstrates dissection, starting in the distal ascending aorta just proximal to the takeoff of the right brachiocephalic artery and extending through the entire thoracic aorta. May involve the ostium of the right brachiocephalic artery. Extent of dissection described separately on CT Abdomen and Pelvis. On limited review of previous exam, no significant change in appearance or proximal extent of dissection.  4.  Adequate coronary heights above the annulus, 14 mm  5. Optimum Fluoroscopic Angle for Delivery: LAO 7 CAU 5  6.  Dual chamber ICD.  Electronically Signed: By: Cherlynn Kaiser On: 12/31/2019 15:58      CLINICAL DATA:  Severe aortic stenosis, history of type a aortic dissection status post emergent ascending aortic repair 2008. Pre-TAVR evaluation.  EXAM: CT ANGIOGRAPHY CHEST, ABDOMEN AND PELVIS  TECHNIQUE: Multidetector CT imaging through the chest, abdomen and pelvis was performed using the standard protocol during bolus administration of intravenous contrast. Multiplanar reconstructed images and MIPs were obtained and reviewed to evaluate the vascular anatomy.  CONTRAST:  155mL OMNIPAQUE IOHEXOL 350 MG/ML  SOLN  COMPARISON:  06/28/2019 CT angiogram of the chest, abdomen and pelvis.  FINDINGS: CTA CHEST FINDINGS  Cardiovascular: Moderate  cardiomegaly. Three lead left subclavian ICD is noted with lead tips in the right atrium and right ventricle. No significant pericardial effusion/thickening. Diffuse thickening and coarse calcification of the aortic valve. Atherosclerotic thoracic aorta. Chronic type A aortic dissection with stable appearance status post ascending thoracic aortic repair. Aortic arch branch vessels arise from the true lumen and are patent. There is stable chronic opacification of the false lumen in the aortic arch and descending thoracic aorta. Maximum ascending thoracic aortic diameter 3.1 cm. Maximum descending thoracic aortic diameter 4.1 cm proximally, stable. No central pulmonary emboli. Stable top-normal caliber main pulmonary artery (3.4 cm diameter).  Mediastinum/Nodes: No discrete thyroid nodules. Unremarkable esophagus. No pathologically enlarged axillary, mediastinal or hilar lymph nodes.  Lungs/Pleura: No pneumothorax. No pleural effusion. No acute consolidative airspace disease or lung masses. Basilar right upper lobe 0.5 cm ground-glass pulmonary nodule (series 5/image 47), stable, for which no follow-up is recommended. No additional significant pulmonary nodules.  Musculoskeletal: No aggressive appearing focal osseous lesions. Intact sternotomy wires. Moderate thoracic spondylosis.  CTA ABDOMEN AND PELVIS FINDINGS  Hepatobiliary: Normal liver with no liver mass. Normal gallbladder with no radiopaque cholelithiasis. No biliary ductal dilatation.  Pancreas: Normal, with no mass or duct dilation.  Spleen: Mild splenomegaly. Craniocaudal splenic length 13.6 cm, not appreciably changed. No splenic mass.  Adrenals/Urinary Tract: Right adrenal 1.7 cm nodule with density 64 HU (series 3/image 222), stable in size. No discrete left  adrenal nodules. No hydronephrosis. Simple bilateral renal cysts, largest 3.8 cm in the lower right kidney. Normal bladder.  Stomach/Bowel: Small hiatal hernia. Otherwise normal nondistended stomach. Normal caliber small bowel with no small bowel wall thickening. Appendectomy. Marked diffuse colonic diverticulosis, most prominent in the sigmoid colon, with no large bowel wall thickening or acute pericolonic fat stranding.  Vascular/Lymphatic: Atherosclerotic abdominal aorta with chronic aortic dissection with dissection flap extending to the distal left common iliac artery, unchanged. Maximum abdominal aortic diameter 2.9 cm, unchanged. Celiac trunk, SMA and right renal artery arise from true lumen. Left renal artery arises from true and false lumens. No pathologically enlarged lymph nodes in the abdomen or pelvis.  Reproductive: Status post hysterectomy, with no abnormal findings at the vaginal cuff. No adnexal mass.  Other: No pneumoperitoneum, ascites or focal fluid collection.  Musculoskeletal: No aggressive appearing focal osseous lesions. Mild lumbar spondylosis.  VASCULAR MEASUREMENTS PERTINENT TO TAVR:  AORTA:  Minimal Aortic Diameter-12.2 x 8.8 mm  Severity of Aortic Calcification-moderate  RIGHT PELVIS:  Right Common Iliac Artery -  Minimal Diameter-10.1 x 9.7 mm  Tortuosity-mild  Calcification-mild  Right External Iliac Artery -  Minimal Diameter-6.9 x 6.5 mm  Tortuosity-mild-to-moderate  Calcification-none  Right Common Femoral Artery -  Minimal Diameter-7.4 x 7.0 mm  Tortuosity-mild  Calcification-mild  LEFT PELVIS:  Left Common Iliac Artery -  Minimal Diameter-9.8 x 7.8 mm (true lumen)  Tortuosity-mild  Calcification-mild  Left External Iliac Artery -  Minimal Diameter-6.6 x 6.5 mm  Tortuosity-mild  Calcification-none  Left Common Femoral Artery -  Minimal Diameter-6.8 x 6.8  mm  Tortuosity-mild  Calcification-none  Review of the MIP images confirms the above findings.  IMPRESSION: 1. Vascular findings and measurements pertinent to potential TAVR procedure, as detailed. 2. Diffuse thickening and calcification of the aortic valve, compatible with the reported history of severe aortic stenosis. 3. Chronic type A aortic dissection status post ascending thoracic aortic repair, with dissection flap extending into left common iliac artery, unchanged. 4. Moderate cardiomegaly. 5. Chronic mild splenomegaly. 6. Right adrenal nodule is  stable in size since 06/28/2019 CT, probably a benign adenoma. Follow-up CT abdomen without and with IV contrast in 12 months suggested. This recommendation follows ACR consensus guidelines: Management of Incidental Adrenal Masses: A White Paper of the ACR Incidental Findings Committee. J Am Coll Radiol 2017;14:1038-1044. 7. Marked diffuse colonic diverticulosis. 8. Small hiatal hernia. 9.  Aortic Atherosclerosis (ICD10-I70.0).   Electronically Signed   By: Ilona Sorrel M.D.   On: 12/31/2019 16:26  STS Risk Calculator: Isolated AVR: Risk of Mortality: 3.863% Renal Failure: 5.460% Permanent Stroke: 2.900% Prolonged Ventilation: 13.945% DSW Infection: 0.202% Reoperation: 3.639% Morbidity or Mortality: 18.051% Short Length of Stay: 23.214% Long Length of Stay: 12.710%   Impression:  This 74 year old woman has stage D, low flow, low gradient, severe aortic stenosis with New York Heart Association class II symptoms of exertional fatigue and shortness of breath with a reduced ejection fraction consistent with chronic combined systolic and diastolic congestive heart failure.  I have personally reviewed her 2D echocardiogram, cardiac catheterization, and CTA studies.  Her echocardiogram shows a severely calcified aortic valve with restricted leaflet mobility.  It is not clear how many leaflet she has.  The mean  gradient across the aortic valve was 27 mmHg with a peak gradient of 48 mmHg.  Dimensionless index was 0.2 consistent with severe aortic stenosis.  Left ventricular ejection fraction was reduced to 45%.  Cardiac catheterization shows mild nonobstructive coronary disease.  The mean gradient across aortic valve was measured at 26 mmHg with a calculated aortic valve area of 0.8 cm.  I agree that aortic valve replacement is indicated in this patient for relief of her progressive symptoms and to prevent progressive left ventricular deterioration.  She would be at very high risk for open surgical aortic valve replacement and I think transcatheter aortic valve replacement would be the best option for her.  Her gated cardiac CTA shows a trileaflet aortic valve with anatomy suitable for transcatheter aortic valve replacement using a SAPIEN 3 valve.  The CTA of the chest, abdomen, and pelvis shows a chronic type A and B dissection with persistent false lumen involving the aortic arch beyond the suture line just proximal to the innominate artery and extending throughout the entire aortic arch and descending thoracic aorta. I don't think she would be a candidate for transfemoral insertion and the carotid and subclavian arteries are also not good options since the dissection involves the entire aortic arch with a patent false lumen.  I think the best option for her is transapical insertion.  The patient and her son were counseled at length regarding treatment alternatives for management of severe symptomatic aortic stenosis. The risks and benefits of surgical intervention has been discussed in detail. Long-term prognosis with medical therapy was discussed. Alternative approaches such as conventional surgical aortic valve replacement, transcatheter aortic valve replacement, and palliative medical therapy were compared and contrasted at length. This discussion was placed in the context of the patient's own specific clinical  presentation and past medical history. All of their questions been addressed.   Following the decision to proceed with transcatheter aortic valve replacement, a discussion was held regarding what types of management strategies would be attempted intraoperatively in the event of life-threatening complications, including whether or not the patient would be considered a candidate for the use of cardiopulmonary bypass and/or conversion to open sternotomy for attempted surgical intervention.  Given her prior aortic dissection repair and chronic dissection involving the aortic arch and descending thoracic aorta I don't think she  would be a candidate for emergent sternotomy to manage any intraoperative complications.    The patient has been advised of a variety of complications that might develop including but not limited to risks of death, stroke, paravalvular leak, aortic dissection or other major vascular complications, aortic annulus rupture, device embolization, cardiac rupture or perforation, mitral regurgitation, acute myocardial infarction, arrhythmia, heart block or bradycardia requiring permanent pacemaker placement, congestive heart failure, respiratory failure, renal failure, pneumonia, infection, other late complications related to structural valve deterioration or migration, or other complications that might ultimately cause a temporary or permanent loss of functional independence or other long term morbidity. The patient provides full informed consent for the procedure as described and all questions were answered.    Plan:  She will be scheduled for transapical transcatheter aortic valve replacement using a SAPIEN three valve on Tuesday, January 29, 2020.  I spent 80 minutes performing this consultation and > 50% of this time was spent face to face counseling and coordinating the care of this patient's severe aortic stenosis.  Gaye Pollack, MD  01/15/2020 4:03 PM

## 2020-01-18 ENCOUNTER — Other Ambulatory Visit: Payer: Self-pay

## 2020-01-18 DIAGNOSIS — I350 Nonrheumatic aortic (valve) stenosis: Secondary | ICD-10-CM

## 2020-01-22 DIAGNOSIS — H2513 Age-related nuclear cataract, bilateral: Secondary | ICD-10-CM | POA: Diagnosis not present

## 2020-01-22 DIAGNOSIS — H524 Presbyopia: Secondary | ICD-10-CM | POA: Diagnosis not present

## 2020-01-22 DIAGNOSIS — H43813 Vitreous degeneration, bilateral: Secondary | ICD-10-CM | POA: Diagnosis not present

## 2020-01-22 DIAGNOSIS — E119 Type 2 diabetes mellitus without complications: Secondary | ICD-10-CM | POA: Diagnosis not present

## 2020-01-24 ENCOUNTER — Other Ambulatory Visit (HOSPITAL_COMMUNITY): Payer: Self-pay | Admitting: Internal Medicine

## 2020-01-24 DIAGNOSIS — I5032 Chronic diastolic (congestive) heart failure: Secondary | ICD-10-CM

## 2020-01-24 NOTE — Progress Notes (Signed)
[id number] - [place], [place] [address] Phone: [phone] Fax: [phone]      Your procedure is scheduled on Tuesday, January 29, 2020.  Report to [facility] Main Entrance "A" at 5:30 A.M., and check in at the Admitting office.  Call this number if you have problems the morning of surgery:  [phone]  Call [phone] if you have any questions prior to your surgery date Monday-Friday 8am-4pm    Remember:  Do not eat or drink after midnight the night before your surgery    Take these medicines the morning of surgery with A SIP OF WATER: carvedilol (COREG) citalopram (CELEXA)  rosuvastatin (CRESTOR) fluticasone (FLONASE) - if needed ALPRAZolam [name] - if needed acetaminophen (TYLENOL) - if needed loperamide (IMODIUM) - if needed Polyethyl Glycol-Propyl Glycol (SYSTANE OP) - if needed  7 days prior to surgery STOP taking any Aspirin (unless otherwise instructed by your surgeon), Aleve, Naproxen, Ibuprofen, Motrin, Advil, Goody's, BC's, all herbal medications, fish oil, and all vitamins.   WHAT DO I DO ABOUT MY DIABETES MEDICATION?   [name] Do not take JARDIANCE the day before surgery or the morning of surgery.  . The day of surgery, do not take other diabetes injectables, including Byetta (exenatide), Bydureon (exenatide ER), Victoza (liraglutide), or Trulicity (dulaglutide).  . If your CBG is greater than 220 mg/dL, you may take  of your sliding scale (correction) dose of insulin.   HOW TO MANAGE YOUR DIABETES BEFORE AND AFTER SURGERY  Why is it important to control my blood sugar before and after surgery? . Improving blood sugar levels before and after surgery helps healing and can limit problems. . A way of improving blood sugar control is eating a healthy diet by: o  Eating less sugar and carbohydrates o  Increasing activity/exercise o  Talking with your doctor about reaching your blood sugar goals . High blood  sugars (greater than 180 mg/dL) can raise your risk of infections and slow your recovery, so you will need to focus on controlling your diabetes during the weeks before surgery. . Make sure that the doctor who takes care of your diabetes knows about your planned surgery including the date and location.  How do I manage my blood sugar before surgery? . Check your blood sugar at least 4 times a day, starting 2 days before surgery, to make sure that the level is not too high or low. . Check your blood sugar the morning of your surgery when you wake up and every 2 hours until you get to the Short Stay unit. o If your blood sugar is less than 70 mg/dL, you will need to treat for low blood sugar: - Do not take insulin. - Treat a low blood sugar (less than 70 mg/dL) with  cup of clear juice (cranberry or apple), 4 glucose tablets, OR glucose gel. - Recheck blood sugar in 15 minutes after treatment (to make sure it is greater than 70 mg/dL). If your blood sugar is not greater than 70 mg/dL on recheck, call [phone] for further instructions. . Report your blood sugar to the short stay nurse when you get to Short Stay.  . If you are admitted to the hospital after surgery: o Your blood sugar will be checked by the staff and you will probably be given insulin after surgery (instead of oral diabetes medicines) to make sure you have good blood sugar levels. o The goal for blood sugar control after surgery  is 80-180 mg/dL.   The Morning of Surgery  Do not wear jewelry, make-up or nail polish.  Do not wear lotions, powders, or perfumes/colognes, or deodorant  Do not shave 48 hours prior to surgery.  Men may shave face and neck.  Do not bring valuables to the hospital.  [facility] is not responsible for any belongings or valuables.  If you are a smoker, DO NOT Smoke 24 hours prior to surgery  If you wear a CPAP at night please bring your mask the morning of surgery   Remember that you must have  someone to transport you home after your surgery, and remain with you for 24 hours if you are discharged the same day.   Please bring cases for contacts, glasses, hearing aids, dentures or bridgework because it cannot be worn into surgery.    Leave your suitcase in the car.  After surgery it may be brought to your room.  For patients admitted to the hospital, discharge time will be determined by your treatment team.  Patients discharged the day of surgery will not be allowed to drive home.    Special instructions:   [place]- Preparing For Surgery  Before surgery, you can play an important role. Because skin is not sterile, your skin needs to be as free of germs as possible. You can reduce the number of germs on your skin by washing with CHG (chlorahexidine gluconate) Soap before surgery.  CHG is an antiseptic cleaner which kills germs and bonds with the skin to continue killing germs even after washing.    Oral Hygiene is also important to reduce your risk of infection.  Remember - BRUSH YOUR TEETH THE MORNING OF SURGERY WITH YOUR REGULAR TOOTHPASTE  Please do not use if you have an allergy to CHG or antibacterial soaps. If your skin becomes reddened/irritated stop using the CHG.  Do not shave (including legs and underarms) for at least 48 hours prior to first CHG shower. It is OK to shave your face.  Please follow these instructions carefully.   1. Shower the NIGHT BEFORE SURGERY and the MORNING OF SURGERY with CHG Soap.   2. If you chose to wash your hair, wash your hair first as usual with your normal shampoo.  3. After you shampoo, rinse your hair and body thoroughly to remove the shampoo.  4. Use CHG as you would any other liquid soap. You can apply CHG directly to the skin and wash gently with a scrungie or a clean washcloth.   5. Apply the CHG Soap to your body ONLY FROM THE NECK DOWN.  Do not use on open wounds or open sores. Avoid contact with your eyes, ears, mouth and  genitals (private parts). Wash Face and genitals (private parts)  with your normal soap.   6. Wash thoroughly, paying special attention to the area where your surgery will be performed.  7. Thoroughly rinse your body with warm water from the neck down.  8. DO NOT shower/wash with your normal soap after using and rinsing off the CHG Soap.  9. Pat yourself dry with a CLEAN TOWEL.  10. Wear CLEAN PAJAMAS to bed the night before surgery, wear comfortable clothes the morning of surgery  11. Place CLEAN SHEETS on your bed the night of your first shower and DO NOT SLEEP WITH PETS.    Day of Surgery:  Please shower the morning of surgery with the CHG soap Do not apply any deodorants/lotions. Please wear clean clothes  to the hospital/surgery center.   Remember to brush your teeth WITH YOUR REGULAR TOOTHPASTE.   Please read over the following fact sheets that you were given.

## 2020-01-25 ENCOUNTER — Encounter (HOSPITAL_COMMUNITY)
Admission: RE | Admit: 2020-01-25 | Discharge: 2020-01-25 | Disposition: A | Discharge status: Home / Self Care | Payer: Medicare HMO | Source: Ambulatory Visit | Attending: Cardiovascular Disease | Admitting: Cardiovascular Disease

## 2020-01-25 ENCOUNTER — Other Ambulatory Visit: Payer: Self-pay

## 2020-01-25 ENCOUNTER — Encounter (HOSPITAL_COMMUNITY): Payer: Self-pay

## 2020-01-25 ENCOUNTER — Telehealth: Payer: Self-pay | Admitting: *Deleted

## 2020-01-25 ENCOUNTER — Other Ambulatory Visit (HOSPITAL_COMMUNITY)
Admission: RE | Admit: 2020-01-25 | Discharge: 2020-01-25 | Disposition: A | Discharge status: Home / Self Care | Payer: Medicare HMO | Source: Ambulatory Visit | Attending: Cardiovascular Disease | Admitting: Cardiovascular Disease

## 2020-01-25 DIAGNOSIS — Z20822 Contact with and (suspected) exposure to covid-19: Secondary | ICD-10-CM | POA: Diagnosis not present

## 2020-01-25 DIAGNOSIS — Z01810 Encounter for preprocedural cardiovascular examination: Secondary | ICD-10-CM | POA: Insufficient documentation

## 2020-01-25 DIAGNOSIS — Z01818 Encounter for other preprocedural examination: Secondary | ICD-10-CM | POA: Insufficient documentation

## 2020-01-25 DIAGNOSIS — I350 Nonrheumatic aortic (valve) stenosis: Secondary | ICD-10-CM | POA: Diagnosis not present

## 2020-01-25 DIAGNOSIS — Z01812 Encounter for preprocedural laboratory examination: Secondary | ICD-10-CM | POA: Diagnosis not present

## 2020-01-25 HISTORY — DX: Presence of automatic (implantable) cardiac defibrillator: Z95.810

## 2020-01-25 HISTORY — DX: Personal history of urinary calculi: Z87.442

## 2020-01-25 LAB — CBC
HCT: 27.3 % — ABNORMAL LOW (ref 36.0–46.0)
Hemoglobin: 8.9 g/dL — ABNORMAL LOW (ref 12.0–15.0)
MCH: 31.2 pg (ref 26.0–34.0)
MCHC: 32.6 g/dL (ref 30.0–36.0)
MCV: 95.8 fL (ref 80.0–100.0)
Platelets: 151 10*3/uL (ref 150–400)
RBC: 2.85 MIL/uL — ABNORMAL LOW (ref 3.87–5.11)
RDW: 14.6 % (ref 11.5–15.5)
WBC: 3.8 10*3/uL — ABNORMAL LOW (ref 4.0–10.5)
nRBC: 0 % (ref 0.0–0.2)

## 2020-01-25 LAB — BLOOD GAS, ARTERIAL
Acid-base deficit: 2.6 mmol/L — ABNORMAL HIGH (ref 0.0–2.0)
Bicarbonate: 21.6 mmol/L (ref 20.0–28.0)
FIO2: 21
O2 Saturation: 98.7 %
Patient temperature: 37
pCO2 arterial: 36.6 mmHg (ref 32.0–48.0)
pH, Arterial: 7.388 (ref 7.350–7.450)
pO2, Arterial: 117 mmHg — ABNORMAL HIGH (ref 83.0–108.0)

## 2020-01-25 LAB — URINALYSIS, ROUTINE W REFLEX MICROSCOPIC
Bilirubin Urine: NEGATIVE
Glucose, UA: >=500 mg/dL — AB
Ketones, ur: NEGATIVE mg/dL
Leukocytes,Ua: NEGATIVE
Nitrite: NEGATIVE
Protein, ur: NEGATIVE mg/dL
Specific Gravity, Urine: 1.014 (ref 1.005–1.030)
pH: 5 (ref 5.0–8.0)

## 2020-01-25 LAB — PREPARE RBC (CROSSMATCH)

## 2020-01-25 LAB — COMPREHENSIVE METABOLIC PANEL
ALT: 15 U/L (ref 0–44)
AST: 23 U/L (ref 15–41)
Albumin: 3.2 g/dL — ABNORMAL LOW (ref 3.5–5.0)
Alkaline Phosphatase: 85 U/L (ref 38–126)
Anion gap: 10 (ref 5–15)
BUN: 35 mg/dL — ABNORMAL HIGH (ref 8–23)
CO2: 20 mmol/L — ABNORMAL LOW (ref 22–32)
Calcium: 8.9 mg/dL (ref 8.9–10.3)
Chloride: 105 mmol/L (ref 98–111)
Creatinine, Ser: 1.25 mg/dL — ABNORMAL HIGH (ref 0.44–1.00)
GFR calc Af Amer: 49 mL/min — ABNORMAL LOW (ref >60)
GFR calc non Af Amer: 43 mL/min — ABNORMAL LOW (ref >60)
Glucose, Bld: 103 mg/dL — ABNORMAL HIGH (ref 70–99)
Potassium: 4.6 mmol/L (ref 3.5–5.1)
Sodium: 135 mmol/L (ref 135–145)
Total Bilirubin: 0.6 mg/dL (ref 0.3–1.2)
Total Protein: 7.5 g/dL (ref 6.5–8.1)

## 2020-01-25 LAB — HEMOGLOBIN A1C
Hgb A1c MFr Bld: 5.4 % (ref 4.8–5.6)
Mean Plasma Glucose: 108.28 mg/dL

## 2020-01-25 LAB — SURGICAL PCR SCREEN
MRSA, PCR: NEGATIVE
Staphylococcus aureus: NEGATIVE

## 2020-01-25 LAB — PROTIME-INR
INR: 1.1 (ref 0.8–1.2)
Prothrombin Time: 14.5 seconds (ref 11.4–15.2)

## 2020-01-25 LAB — BRAIN NATRIURETIC PEPTIDE: B Natriuretic Peptide: 765 pg/mL — ABNORMAL HIGH (ref 0.0–100.0)

## 2020-01-25 LAB — GLUCOSE, CAPILLARY: Glucose-Capillary: 127 mg/dL — ABNORMAL HIGH (ref 70–99)

## 2020-01-25 LAB — SARS CORONAVIRUS 2 (TAT 6-24 HRS): SARS Coronavirus 2: NEGATIVE

## 2020-01-25 LAB — APTT: aPTT: 29 seconds (ref 24–36)

## 2020-01-25 NOTE — Progress Notes (Signed)
PCP:  [facility] Cardiologist:  Sanda Klein, MD  EKG:  01/25/20 CXR:  01/25/20 ECHO:  12/19/19 Stress Test:  Denies Cardiac Cath:  01/09/20  Fasting Blood Sugar-  90-157 Checks Blood Sugar__2_ times a day  Anesthesia Review:  Yes, cardiac history.  Medtronic ICD.  Covid 01/25/20  Patient denies shortness of breath, fever, cough, and chest pain at PAT appointment.  Patient verbalized understanding of instructions provided today at the PAT appointment.  Patient asked to review instructions at home and day of surgery.

## 2020-01-25 NOTE — Telephone Encounter (Signed)
Our office received fax for clearance on Medtronic device. I will send a note to device clinic as well as forward fax to device clinic.

## 2020-01-26 ENCOUNTER — Other Ambulatory Visit: Payer: Self-pay | Admitting: Family Medicine

## 2020-01-26 DIAGNOSIS — F419 Anxiety disorder, unspecified: Secondary | ICD-10-CM

## 2020-01-28 MED ORDER — POTASSIUM CHLORIDE 2 MEQ/ML IV SOLN
80.0000 meq | INTRAVENOUS | Status: DC
  Filled 2020-01-28: qty 40

## 2020-01-28 MED ORDER — NOREPINEPHRINE 4 MG/250ML-% IV SOLN
0.0000 ug/min | INTRAVENOUS | Status: DC
  Filled 2020-01-28: qty 250

## 2020-01-28 MED ORDER — SODIUM CHLORIDE 0.9 % IV SOLN
1.5000 g | INTRAVENOUS | Status: AC
  Administered 2020-01-29: 1.5 g via INTRAVENOUS
  Filled 2020-01-28: qty 1.5

## 2020-01-28 MED ORDER — SODIUM CHLORIDE 0.9 % IV SOLN
INTRAVENOUS | Status: DC
  Filled 2020-01-28: qty 30

## 2020-01-28 MED ORDER — VANCOMYCIN HCL 1250 MG/250ML IV SOLN
1250.0000 mg | INTRAVENOUS | Status: AC
  Administered 2020-01-29: 1250 mg via INTRAVENOUS
  Filled 2020-01-28: qty 250

## 2020-01-28 MED ORDER — DEXMEDETOMIDINE HCL IN NACL 400 MCG/100ML IV SOLN
0.1000 ug/kg/h | INTRAVENOUS | Status: DC
  Filled 2020-01-28: qty 100

## 2020-01-28 MED ORDER — MAGNESIUM SULFATE 50 % IJ SOLN
40.0000 meq | INTRAMUSCULAR | Status: DC
  Filled 2020-01-28: qty 9.85

## 2020-01-28 NOTE — H&P (Signed)
[address]       [place] [address]             [phone]      Cardiothoracic Surgery Admission History and Physical   Referring Provider is Croitoru, Dani Gobble, MD  Primary Cardiologist is Sanda Klein, MD  PCP is Carollee Herter, Alferd Apa, DO      Chief Complaint  Patient presents with  . Aortic Stenosis       HPI:  The patient is a 74 year old woman with history of diabetes hypertension, hyperlipidemia, sinus node dysfunction status post permanent pacemaker in the past, type B aortic dissection in 2003 treated with medical therapy, type a aortic dissection in 2008 treated by Dr. Servando Snare with replacement of the ascending aorta from the supra coronary level to the innominate artery using a 28 mm graft and aortic valve resuspension, sustained VT with syncope in July 2020 with subsequent upgrade of her pacemaker to an ICD as well as known aortic stenosis that has been followed by Dr. Sallyanne Kuster. She has a history of progressive exertional fatigue and shortness of breath over the past 6 to 12 months which continues to worsen. Her most recent echocardiogram on 12/19/2019 shows reduction in her ejection fraction to 40 to 45% which was felt to be due to RV apical pacing. She has moderate concentric left ventricular hypertrophy. The left ventricular internal cavity is moderately dilated. The aortic valve has an indeterminate number of cusps with severe thickening and calcification. There is mild aortic insufficiency. The mean aortic valve gradient is 26.5 mmHg with an aortic valve area of 0.63 cm. Dimensionless index of 0.2 suggesting a severe, low flow, low gradient aortic stenosis.   She lives with her daughter and also has a son who came to the office with her. She has shortness of breath and fatigue while doing any housework. She has had no further dizziness or syncope. She has had some swelling in her legs which is improved with diuretic.      Past Medical History:   Diagnosis Date  . Acute thoracic aortic dissection [facility]) 2008   emergency surgery - [place]  . Anxiety   . Cancer ([place])    skin  . CKD (chronic kidney disease) stage 3, GFR 30-59 ml/min   . Diabetes mellitus   . Diverticulosis   . GERD (gastroesophageal reflux disease)   . Hemorrhoids   . Hyperlipidemia   . Hypertension   . Kidney stones   . Severe aortic stenosis   . Sinus node dysfunction ([facility])    hx of PPM  . Sustained ventricular tachycardia ([place])    s/p ICD 05/2019        Past Surgical History:  Procedure Laterality Date  . ABDOMINAL HYSTERECTOMY  1984  . APPENDECTOMY    . EYE SURGERY  2014   Rght eye  . ICD IMPLANT N/A 06/28/2019   Procedure: PPM upgrade to ICD; Surgeon: Evans Lance, MD; Location: [place]; Service: Cardiovascular; Laterality: N/A;  . PACEMAKER GENERATOR CHANGE  2014   Medtronic adapta  . PACEMAKER GENERATOR CHANGE N/A 08/29/2013   Procedure: PACEMAKER GENERATOR CHANGE; Surgeon: Sanda Klein, MD; Location: [place] [facility]; Service: Cardiovascular; Laterality: N/A;  . PACEMAKER INSERTION  08/2006   dual chamber Medtronic EnRhythm; r/t sinus node dysfunction   . REPAIR OF ACUTE ASCENDING THORACIC AORTIC DISSECTION  2008   Dr. Servando Snare  . RIGHT/LEFT HEART CATH AND CORONARY ANGIOGRAPHY N/A 01/09/2020  Procedure: RIGHT/LEFT HEART CATH AND CORONARY ANGIOGRAPHY; Surgeon: Sherren Mocha, MD; Location: [facility] [facility]; Service: Cardiovascular; Laterality: N/A;  . THORACIC AORTIC ANEURYSM REPAIR  2000   type III  . TRANSTHORACIC ECHOCARDIOGRAM  09/20/2012   EF=>55% with mild conc LVH; LA mod dilated; RA mildly dilated; mild MR/TR/AR        Family History  Problem Relation Age of Onset  . Kidney disease Mother   . Heart attack Father   . Heart attack Maternal Grandmother   . Valvular heart disease Son    valve replacement at 4  . Cancer Neg Hx    Social History        Socioeconomic History  . Marital status: Widowed    Spouse  name: Not on file  . Number of children: 2  . Years of education: Not on file  . Highest education level: Not on file  Occupational History    Employer: RETIRED  Tobacco Use  . Smoking status: Former Smoker    Quit date: 09/04/1993    Years since quitting: 26.3  . Smokeless tobacco: Never Used  Substance and Sexual Activity  . Alcohol use: No  . Drug use: No  . Sexual activity: Not Currently  Other Topics Concern  . Not on file  Social History Narrative   Lives at home and husband was just moved to [place]   Social Determinants of Health      Financial Resource Strain:   . Difficulty of Paying Living Expenses: Not on file  Food Insecurity:   . Worried About Charity fundraiser in the Last Year: Not on file  . Ran Out of Food in the Last Year: Not on file  Transportation Needs:   . Lack of Transportation (Medical): Not on file  . Lack of Transportation (Non-Medical): Not on file  Physical Activity:   . Days of Exercise per Week: Not on file  . Minutes of Exercise per Session: Not on file  Stress:   . Feeling of Stress : Not on file  Social Connections:   . Frequency of Communication with Friends and Family: Not on file  . Frequency of Social Gatherings with Friends and Family: Not on file  . Attends Religious Services: Not on file  . Active Member of Clubs or Organizations: Not on file  . Attends Archivist Meetings: Not on file  . Marital Status: Not on file  Intimate Partner Violence:   . Fear of Current or Ex-Partner: Not on file  . Emotionally Abused: Not on file  . Physically Abused: Not on file  . Sexually Abused: Not on file         Current Outpatient Medications  Medication Sig Dispense Refill  . acetaminophen (TYLENOL) 500 MG tablet Take 1,000 mg by mouth every 6 (six) hours as needed for headache.    . ALPRAZolam (XANAX) 0.5 MG tablet TAKE 1 TABLET (0.5 MG TOTAL) BY MOUTH 3 (THREE) TIMES DAILY AS NEEDED FOR ANXIETY. 90 tablet 0  . Ascorbic  Acid (VITAMIN C) 1000 MG tablet Take 1,000 mg by mouth daily.    . carvedilol (COREG) 25 MG tablet TAKE 1 TABLET BY MOUTH TWICE A DAY WITH A MEAL (Patient taking differently: Take 25 mg by mouth 2 (two) times daily with a meal. ) 180 tablet 1  . citalopram (CELEXA) 20 MG tablet TAKE 3 TABLETS BY MOUTH EVERY DAY (Patient taking differently: Take 60 mg by mouth daily. ) 270 tablet 9  .  ferrous sulfate 325 (65 FE) MG tablet Take 325 mg by mouth daily with breakfast.    . fluticasone (FLONASE) 50 MCG/ACT nasal spray USE 2 SPRAYS IN EACH NOSTIRL EVERY DAY (Patient taking differently: Place 2 sprays into both nostrils daily as needed for allergies. ) 16 g 1  . furosemide (LASIX) 20 MG tablet Take 1 tablet (20 mg total) by mouth daily. Okay to take 1 extra tablet as needed for swelling. (Patient taking differently: Take 20-40 mg by mouth daily. to take 1 extra tablet as needed for swelling.) 90 tablet 3  . hyoscyamine (LEVSIN SL) 0.125 MG SL tablet TAKE 1 TABLET (0.125 MG TOTAL) BY MOUTH EVERY 6 (SIX) HOURS AS NEEDED. 60 tablet 1  . JARDIANCE 10 MG TABS tablet TAKE 1 TABLET(10 MG) BY MOUTH DAILY. (Patient taking differently: Take 10 mg by mouth daily. ) 30 tablet 3  . loperamide (IMODIUM) 2 MG capsule Take 2 mg by mouth as needed for diarrhea or loose stools.    . Omega-3 Fatty Acids (FISH OIL) 1000 MG CAPS Take 1,000 mg by mouth daily.     Vladimir Faster Glycol-Propyl Glycol (SYSTANE OP) Place 1 drop into both eyes daily as needed (Dry eyes).     . Potassium Chloride ER 20 MEQ TBCR TAKE 1 TABLET BY MOUTH EVERY DAY (Patient taking differently: Take 20 mEq by mouth daily. ) 90 tablet 4  . Probiotic Product (PRO-BIOTIC BLEND PO) Take 1 tablet by mouth daily. [employer] brand    . rosuvastatin (CRESTOR) 20 MG tablet TAKE 1 TABLET BY MOUTH EVERY DAY (Patient taking differently: Take 20 mg by mouth daily. ) 90 tablet 1  . losartan (COZAAR) 25 MG tablet Take 0.5 tablets (12.5 mg total) by mouth at bedtime. 90 tablet 3             Current Facility-Administered Medications  Medication Dose Route Frequency Provider Last Rate Last Admin  . diclofenac sodium (VOLTAREN) 1 % transdermal gel 2 g 2 g Topical QID Carollee Herter, Kendrick Fries R, DO          Allergies  Allergen Reactions  . Oxycodone Itching  . Vicodin [Hydrocodone-Acetaminophen] Itching  . Lidocaine-Aloe Vera Other (See Comments)    Felt like bees stinging her  . Latex Rash    Itching/rash   . Neomycin-Bacitracin Zn-Polymyx Other (See Comments)    Red , swollen eye  . Sulfonamide Derivatives Other (See Comments)    Just had reaction when taking during pregnancy- last child birth 73   Review of Systems:   General: normal appetite, + decreased energy, no weight gain, + 60 lb intentional weight loss, no fever  Cardiac: no chest pain with exertion, no chest pain at rest, +SOB with mild exertion, no resting SOB, no PND, no orthopnea, no palpitations, + arrhythmia, + atrial fibrillation, + LE edema, no dizzy spells, no syncope  Respiratory: + exertional shortness of breath, no home oxygen, no productive cough, no dry cough, no bronchitis, no wheezing, no hemoptysis, no asthma, no pain with inspiration or cough, no sleep apnea, no CPAP at night  GI: no difficulty swallowing, no reflux, no frequent heartburn, no hiatal hernia, no abdominal pain, no constipation, no diarrhea, no hematochezia, no hematemesis, no melena  GU: no dysuria, no frequency, no urinary tract infection, no hematuria, no kidney stones, no kidney disease  Vascular: no pain suggestive of claudication, no pain in feet, no leg cramps, no varicose veins, no DVT, no non-healing foot ulcer  Neuro: no stroke,  no TIA's, no seizures, no headaches, no temporary blindness one eye, no slurred speech, no peripheral neuropathy, no chronic pain, + instability of gait, no memory/cognitive dysfunction  Musculoskeletal: no arthritis, no joint swelling, no myalgias, + difficulty walking, + reduced mobility  Skin:  no rash, no itching, no skin infections, no pressure sores or ulcerations  Psych: + anxiety, no depression, no nervousness, no unusual recent stress  Eyes: no blurry vision, no floaters, no recent vision changes, + wears glasses or contacts  ENT: no hearing loss, no loose or painful teeth, o dentures, last saw dentist 1 year ago.  Hematologic: no easy bruising, no abnormal bleeding, no clotting disorder, no frequent epistaxis  Endocrine: + diabetes, does check CBG's at home    Physical Exam:   BP 120/70  Pulse 71  Temp 97.6 F (36.4 C) (Skin)  Resp 20  Ht 5' (1.524 m)  Wt 162 lb (73.5 kg)  SpO2 94% Comment: RA  BMI 31.64 kg/m  General: Elderly, well-appearing  HEENT: Unremarkable, NCAT, PERLA  Neck: no JVD, no bruits, no adenopathy or thyromegaly  Chest: clear to auscultation, symmetrical breath sounds, no wheezes, no rhonchi  CV: RRR, grade lll/VI crescendo/decrescendo murmur heard best at RSB, no diastolic murmur  Abdomen: soft, non-tender, no masses  Extremities: warm, well-perfused, pulses palpable, mild LE edema in ankles and feet  Rectal/GU Deferred  Neuro: Grossly non-focal and symmetrical throughout  Skin: Clean and dry, no rashes, no breakdown    Diagnostic Tests:   ECHOCARDIOGRAM REPORT     Patient Name: [name] Date of Exam: 12/19/2019  Medical Rec #: [id number] Height: 60.0 in  Accession #: [id number] Weight: 159.0 lb  Date of Birth: 1946-11-20 BSA: 1.69 m  Patient Age: 89 years BP: 122/64 mmHg  Patient Gender: F HR: 67 bpm.  Exam Location: [address]   Procedure: 2D Echo, Cardiac Doppler and Color Doppler   Indications: I35.0   History: Patient has prior history of Echocardiogram examinations,  most  recent 06/28/2019. Defibrillator, AS,  Signs/Symptoms:Shortness of  Breath; Risk Factors:Hypertension, Dyslipidemia, Former  Smoker  and Diabetes.   Sonographer: Coralyn Helling RDCS  Referring Phys: [place]     1. Left ventricular ejection fraction, by visual estimation, is 40 to  45%. The left ventricle has mild to moderately decreased function. There  is moderately increased left ventricular hypertrophy.  2. Moderately dilated left ventricular internal cavity size.  3. The left ventricle demonstrates regional wall motion abnormalities.  4. Septal and inferior wall hypokinesis Since July EF has decreased and  AV gradient have increased. Patient may benefit from further evaluation to  include right and left heart cath.  5. Global right ventricle has normal systolic function.The right  ventricular size is normal. No increase in right ventricular wall  thickness.  6. Left atrial size was mildly dilated.  7. Right atrial size was mildly dilated.  8. The mitral valve is normal in structure. Mild mitral valve  regurgitation.  9. The tricuspid valve is normal in structure.  10. The tricuspid valve is normal in structure. Tricuspid valve  regurgitation is moderate.  11. The aortic valve has an indeterminant number of cusps. Aortic valve  regurgitation is mild. Severe aortic valve stenosis.  12. There is severe calcifcation of the aortic valve.  13. There is severe thickening of the aortic valve.  14. Pulmonic regurgitation is mild.  15. The pulmonic valve was grossly normal. Pulmonic valve regurgitation is  mild.  16. Post graft repair for dissection with no residual aneurysm.  17. Moderately elevated pulmonary artery systolic pressure.  18. The interatrial septum was not well visualized.   FINDINGS  Left Ventricle: Left ventricular ejection fraction, by visual estimation,  is 40 to 45%. The left ventricle has mild to moderately decreased  function. The left ventricle demonstrates regional wall motion  abnormalities. The left ventricular internal  cavity size was moderately dilated left ventricle. There is moderately  increased left ventricular hypertrophy. Septal and inferior wall   hypokinesis Since July EF has decreased and AV gradient have increased.  Patient may benefit from further evaluation to  include right and left heart cath.   Right Ventricle: The right ventricular size is normal. No increase in  right ventricular wall thickness. Global RV systolic function is has  normal systolic function. The tricuspid regurgitant velocity is 3.06 m/s,  and with an assumed right atrial pressure  of 10 mmHg, the estimated right ventricular systolic pressure is  moderately elevated at 47.4 mmHg. AICD wire seen in RA/RV.   Left Atrium: Left atrial size was mildly dilated.   Right Atrium: Right atrial size was mildly dilated   Pericardium: There is no evidence of pericardial effusion.   Mitral Valve: The mitral valve is normal in structure. There is mild  thickening of the mitral valve leaflet(s). There is mild calcification of  the mitral valve leaflet(s). Mild mitral valve regurgitation.   Tricuspid Valve: The tricuspid valve is normal in structure. Tricuspid  valve regurgitation is moderate.   Aortic Valve: The aortic valve has an indeterminant number of cusps. .  There is severe thickening and severe calcifcation of the aortic valve.  Aortic valve regurgitation is mild. Aortic regurgitation PHT measures 338  msec. Severe aortic stenosis is  present. Moderate aortic valve annular calcification. There is severe  thickening of the aortic valve. There is severe calcifcation of the aortic  valve. Aortic valve mean gradient measures 26.5 mmHg. Aortic valve peak  gradient measures 48.2 mmHg. Aortic  valve area, by VTI measures 0.63 cm. Valve is heavily calcified with DVI  0.2 suggesting severe low gradient low flow AS with mean gradient 27 mmHg  due to low EF.   Pulmonic Valve: The pulmonic valve was grossly normal. Pulmonic valve  regurgitation is mild. Pulmonic regurgitation is mild.   Aorta: The aortic root is normal in size and structure. Post graft repair   for dissection with no residual aneurysm.   IAS/Shunts: The interatrial septum was not well visualized.    LEFT VENTRICLE  PLAX 2D  LVIDd: 5.00 cm Diastology  LVIDs: 3.30 cm LV e' lateral: 9.03 cm/s  LV PW: 1.30 cm LV E/e' lateral: 11.0  LV IVS: 1.60 cm LV e' medial: 4.13 cm/s  LVOT diam: 2.00 cm LV E/e' medial: 24.0  LV SV: 74 ml  LV SV Index: 41.72  LVOT Area: 3.14 cm    RIGHT VENTRICLE IVC  TAPSE (M-mode): 1.5 cm IVC diam: 1.00 cm  RVSP: 40.4 mmHg   LEFT ATRIUM Index RIGHT ATRIUM Index  LA diam: 4.20 cm 2.48 cm/m RA Pressure: 3.00 mmHg  LA Vol (A2C): 67.4 ml 39.81 ml/m RA Area: 21.90 cm  LA Vol (A4C): 78.4 ml 46.30 ml/m RA Volume: 70.80 ml 41.81 ml/m  LA Biplane Vol: 75.6 ml 44.65 ml/m  AORTIC VALVE  AV Area (Vmax): 0.58 cm  AV Area (Vmean): 0.62 cm  AV Area (VTI): 0.63 cm  AV Vmax: 347.00 cm/s  AV Vmean: 241.500  cm/s  AV VTI: 0.864 m  AV Peak Grad: 48.2 mmHg  AV Mean Grad: 26.5 mmHg  LVOT Vmax: 63.70 cm/s  LVOT Vmean: 47.500 cm/s  LVOT VTI: 0.173 m  LVOT/AV VTI ratio: 0.20  AI PHT: 338 msec   AORTA  Ao Root diam: 3.50 cm  Ao Asc diam: 2.90 cm   MITRAL VALVE TRICUSPID VALVE  MV Area (PHT): TR Peak grad: 37.4 mmHg  TR Vmax: 312.00 cm/s  MV Decel Time: 164 msec Estimated RAP: 3.00 mmHg  MV E velocity: 99.20 cm/s 103 cm/s RVSP: 40.4 mmHg  MV A velocity: 72.80 cm/s 70.3 cm/s  MV E/A ratio: 1.36 1.5 SHUNTS  Systemic VTI: 0.17 m  Systemic Diam: 2.00 cm    Jenkins Rouge MD  Electronically signed by Jenkins Rouge MD  Signature Date/Time: 12/19/2019/11:48:37 AM     Panel Physicians Referring Physician Case Authorizing Physician  Sherren Mocha, MD (Primary)       Procedures  RIGHT/LEFT HEART CATH AND CORONARY ANGIOGRAPHY     Conclusion  1. Widely patent coronary arteries with minimal nonobstructive RCA stenosis  2. Severe aortic stenosis with mean transvalvular gradient 26 mmHg and calculated valve area 0.8 cm  3. Mild pulmonary  hypertension with mean PA pressure 31 mmHg  Plan: Continue TAVR evaluation           Indications  Severe aortic stenosis [I35.0 (ICD-10-CM)]     Procedural Details  Technical Details INDICATION: Severe, stage D3 (paradoxical low flow low gradient) aortic stenosis. Complex 74 year old patient has a remote history of type B aortic dissection and later suffered a type a dissection requiring emergency surgery. She has a chronic dissection throughout the thoracoabdominal aorta even involving the aortic arch. The ascending aortic repair is stable. She presents for right and left heart catheterization for further evaluation and preparation of TAVR.  PROCEDURAL DETAILS: There was an indwelling IV in a right antecubital vein. Using normal sterile technique, the IV was changed out for a 5 Fr brachial sheath over a 0.018 inch wire. The right wrist was then prepped, draped, and anesthetized with 1% lidocaine. Attempts were made at accessing the ulnar artery using direct ultrasound guidance. The radial artery was too small to cannulate. Made multiple attempts to cannulate the ulnar artery but the artery continually rolled every time a needle approach to it. After 3 or 4 attempts, attention was turned to the left groin. Using direct ultrasound guidance and fluoroscopic landmarks, the left femoral artery is accessed via a front wall puncture. A 5 French sheath is inserted. Using caution, a wire was advanced into the aortic root under fluoroscopic guidance. All catheter exchanges were made over an exchange length wire to avoid having to rewire the aorta. Standard Judkins catheters were used for coronary angiography. An AL-1 catheter was used to direct a straight-tip wire across the aortic valve and pullback pressures were recorded. A Swan-Ganz catheter was used for the right heart catheterization. Standard protocol was followed for recording of right heart pressures and sampling of oxygen saturations. Fick cardiac  output was calculated. A mynx device is used for femoral hemostasis. There were no immediate procedural complications. The patient was transferred to the post catheterization recovery area for further monitoring.    Estimated blood loss <50 mL.   During this procedure medications were administered to achieve and maintain moderate conscious sedation while the patient's heart rate, blood pressure, and oxygen saturation were continuously monitored and I was present face-to-face 100% of this time.  Medications  (Filter: Administrations occurring from 01/09/20 1312 to 01/09/20 1445)  Continuous medications are totaled by the amount administered until 01/09/20 1445.  fentaNYL (SUBLIMAZE) injection (mcg)  Total dose: 50 mcg  Date/Time   Rate/Dose/Volume Action  01/09/20 1328  25 mcg Given  1408  25 mcg Given  midazolam (VERSED) injection (mg)  Total dose: 4 mg  Date/Time   Rate/Dose/Volume Action  01/09/20 1328  2 mg Given  1408  2 mg Given  lidocaine (PF) (XYLOCAINE) 1 % injection (mL)  Total volume: 19 mL  Date/Time   Rate/Dose/Volume Action  01/09/20 1343  2 mL Given  1347  2 mL Given  1408  15 mL Given  iohexol (OMNIPAQUE) 350 MG/ML injection (mL)  Total volume: 30 mL  Date/Time   Rate/Dose/Volume Action  01/09/20 1443  30 mL Given  Heparin (Porcine) in NaCl 1000-0.9 UT/500ML-% SOLN (mL)  Total volume: 1,000 mL  Date/Time   Rate/Dose/Volume Action  01/09/20 1444  500 mL Given  1444  500 mL Given     Sedation Time  Sedation Time Physician-1: 1 hour 3 minutes 45 seconds        Contrast  Medication Name Total Dose  iohexol (OMNIPAQUE) 350 MG/ML injection 30 mL     Radiation/Fluoro  Fluoro time: 5.5 (min)  DAP: 16336 (mGycm2)  Cumulative Air Kerma: 267 (mGy)        Coronary Findings  Diagnostic  Dominance: Right  Left Main  Vessel is angiographically normal.   Left Anterior Descending  The vessel exhibits minimal luminal irregularities. The LAD is  patent to the apex. The diagonal branches are patent. There are no significant stenoses throughout the LAD territory.   Left Circumflex  Vessel is angiographically normal.   Right Coronary Artery  The vessel exhibits minimal luminal irregularities. Large, dominant vessel. There is mild nonobstructive stenosis at the junction of the mid to distal vessel, no more than 25 to 30% narrowed. The vessel supplies a PDA and PLA branch.  Intervention  No interventions have been documented.                         Left Heart  Aortic Valve There is severe aortic valve stenosis. The aortic valve is calcified. Peak to peak gradient 30 mmHg, mean gradient 26 mmHg, calculated valve area 0.8 cm     Coronary Diagrams  Diagnostic  Dominance: Right  &&&&&  Intervention       Implants                  Vascular Products  Closure Mynx Control 60f [id number] - Implanted  Inventory item: CLOSURE [facility] CONTROL 48F Model/Cat number: [id number]  Manufacturer: [employer] Lot number: [id number]  Device identifier: [id number] Device identifier type: GS1  GUDID Information   Request status Successful     Brand name: MYNX CONTROL Version/Model: [id number]   Company name: [employer] safety info as of 01/09/20: MR Safe   Contains dry or latex rubber: No     GMDN P.T. name: Wound hydrogel dressing, non-antimicrobial     As of 01/09/2020    Status: Implanted                        [employer]ng[employer] Images Link to Procedure Log  Show images for CARDIAC CATHETERIZATION Procedure Log     Images on Long Term Storage   Show images for [place],  Theresa L            Hemo Data    Most Recent Value  Fick Cardiac Output 4.12 L/min  Fick Cardiac Output Index 2.41 (L/min)/BSA  Aortic Mean Gradient 25.5 mmHg  Aortic Peak Gradient 30 mmHg  Aortic Valve Area 0.80  Aortic Value Area Index 0.47 cm2/BSA  RA A Wave 8 mmHg  RA V Wave 11 mmHg  RA Mean 8 mmHg  RV Systolic Pressure 50 mmHg  RV  Diastolic Pressure 5 mmHg  RV EDP 9 mmHg  PA Systolic Pressure 55 mmHg  PA Diastolic Pressure 21 mmHg  PA Mean 33 mmHg  PW A Wave 19 mmHg  PW V Wave 22 mmHg  PW Mean 16 mmHg  AO Systolic Pressure 123456 mmHg  AO Diastolic Pressure 59 mmHg  AO Mean 92 mmHg  LV Systolic Pressure [id number] mmHg  LV Diastolic Pressure 9 mmHg  LV EDP 19 mmHg  AOp Systolic Pressure 0000000 mmHg  AOp Diastolic Pressure 54 mmHg  AOp Mean Pressure 86 mmHg  LVp Systolic Pressure [id number] mmHg  LVp Diastolic Pressure 7 mmHg  LVp EDP Pressure 17 mmHg  QP/QS 1  TPVR Index 12.88 HRUI  TSVR Index 38.23 HRUI  PVR SVR Ratio 0.18  TPVR/TSVR Ratio 0.34     ADDENDUM REPORT: 12/31/2019 16:26  EXAM:  OVER-READ INTERPRETATION CT CHEST  The following report is an over-read performed by radiologist Dr.  Samara Snide [facility] on 12/31/2019. This over-read  does not include interpretation of cardiac or coronary anatomy or  pathology. The coronary CTA interpretation by the cardiologist is  attached.  COMPARISON: 06/28/2019 chest CT angiogram.  FINDINGS:  Please see the separate concurrent chest CT angiogram report for  details.  IMPRESSION:  Please see the separate concurrent chest CT angiogram report for  details.  Electronically Signed  By: Ilona Sorrel M.D.  On: 12/31/2019 16:26   Addended by Sharyn Blitz, MD on 12/31/2019 5:02 PM  Study Result   CLINICAL DATA: Aortic stenosis  EXAM:  Cardiac TAVR CT  TECHNIQUE:  The patient was scanned on a Siemens Force [id number] slice scanner. A 120  kV retrospective scan was triggered in the descending thoracic aorta  at 111 HU's. Gantry rotation speed was 270 msecs and collimation was  .9 mm. No beta blockade or nitro were given. The 3D data set was  reconstructed in 5% intervals of the R-R cycle. Systolic and  diastolic phases were analyzed on a dedicated work station using  MPR, MIP and VRT modes. The patient received 129mL OMNIPAQUE IOHEXOL  350 MG/ML SOLN of  contrast.  FINDINGS:  Aortic Valve: Tricuspid aortic valve. Moderately reduced cusp  separation. Moderately thickened, severely calcified aortic valve  cusps.  AV calcium score: 1713  Virtual Basal Annulus Measurements:  Maximum/Minimum Diameter: 24.9 x 21.3 mm  Perimeter: 73.1 mm  Area: 414 mm2  Focal calcification at the annulus.  Based on these measurements, the annulus would be suitable for a 23  mm Edwards Sapien 3 valve.  Sinus of Valsalva Measurements:  Non-coronary: 33 mm  Right - coronary: 34 mm  Left - coronary: 32 mm  Sinus of Valsalva Height:  Left: 17.8 mm  Right: 20.4 mm  Aorta: Previous ascending aorta dissection (Type A) s/p ascending  aorta replacement and resuspension of aortic valve. Just distal to  the replaced segment, the aorta demonstrates dissection, starting in  the distal ascending aorta just proximal to the takeoff of the right  brachiocephalic artery and extending through the entire thoracic  aorta. May involve the ostium of the right brachiocephalic artery.  Extent of dissection described separately on CT Abdomen and Pelvis.  On limited review of previous exam, no significant change in  appearance or proximal extent of dissection.  Sinotubular Junction: 28 mm  Ascending Thoracic Aorta: 29 mm within graft  Aortic Arch: 18.1 mm true lumen, 12.4 mm false lumen  Descending Thoracic Aorta: Total diameter 33 mm, true lumen 13 mm,  false lumen 19 mm  Coronary Artery Height above Annulus:  Left Main: 14.1 mm  Right Coronary: 14 mm  Coronary Arteries: 3 vessel coronary artery calcifications.  Optimum Fluoroscopic Angle for Delivery: LAO 7 CAU 5  Dual chamber ICD noted.  Normal appearing LV apex.  IMPRESSION:  1. Tricuspid aortic valve. Moderately reduced cusp separation.  Moderately thickened, severely calcified aortic valve cusps.  2. AV calcium score: 1713  3. Previous ascending aorta dissection (Type A) s/p ascending aorta  replacement and  resuspension of aortic valve. Just distal to the  replaced segment, the aorta demonstrates dissection, starting in the  distal ascending aorta just proximal to the takeoff of the right  brachiocephalic artery and extending through the entire thoracic  aorta. May involve the ostium of the right brachiocephalic artery.  Extent of dissection described separately on CT Abdomen and Pelvis.  On limited review of previous exam, no significant change in  appearance or proximal extent of dissection.  4. Adequate coronary heights above the annulus, 14 mm  5. Optimum Fluoroscopic Angle for Delivery: LAO 7 CAU 5  6. Dual chamber ICD.  Electronically Signed:  By: Cherlynn Kaiser  On: 12/31/2019 15:58   CLINICAL DATA: Severe aortic stenosis, history of type a aortic  dissection status post emergent ascending aortic repair 2008.  Pre-TAVR evaluation.  EXAM:  CT ANGIOGRAPHY CHEST, ABDOMEN AND PELVIS  TECHNIQUE:  Multidetector CT imaging through the chest, abdomen and pelvis was  performed using the standard protocol during bolus administration of  intravenous contrast. Multiplanar reconstructed images and MIPs were  obtained and reviewed to evaluate the vascular anatomy.  CONTRAST: 189mL OMNIPAQUE IOHEXOL 350 MG/ML SOLN  COMPARISON: 06/28/2019 CT angiogram of the chest, abdomen and  pelvis.  FINDINGS:  CTA CHEST FINDINGS  Cardiovascular: Moderate cardiomegaly. Three lead left subclavian  ICD is noted with lead tips in the right atrium and right ventricle.  No significant pericardial effusion/thickening. Diffuse thickening  and coarse calcification of the aortic valve. Atherosclerotic  thoracic aorta. Chronic type A aortic dissection with stable  appearance status post ascending thoracic aortic repair. Aortic arch  branch vessels arise from the true lumen and are patent. There is  stable chronic opacification of the false lumen in the aortic arch  and descending thoracic aorta. Maximum ascending  thoracic aortic  diameter 3.1 cm. Maximum descending thoracic aortic diameter 4.1 cm  proximally, stable. No central pulmonary emboli. Stable top-normal  caliber main pulmonary artery (3.4 cm diameter).  Mediastinum/Nodes: No discrete thyroid nodules. Unremarkable  esophagus. No pathologically enlarged axillary, mediastinal or hilar  lymph nodes.  Lungs/Pleura: No pneumothorax. No pleural effusion. No acute  consolidative airspace disease or lung masses. Basilar right upper  lobe 0.5 cm ground-glass pulmonary nodule (series 5/image 47),  stable, for which no follow-up is recommended. No additional  significant pulmonary nodules.  Musculoskeletal: No aggressive appearing focal osseous lesions.  Intact sternotomy wires. Moderate thoracic spondylosis.  CTA ABDOMEN AND PELVIS FINDINGS  Hepatobiliary: Normal liver with no liver  mass. Normal gallbladder  with no radiopaque cholelithiasis. No biliary ductal dilatation.  Pancreas: Normal, with no mass or duct dilation.  Spleen: Mild splenomegaly. Craniocaudal splenic length 13.6 cm, not  appreciably changed. No splenic mass.  Adrenals/Urinary Tract: Right adrenal 1.7 cm nodule with density 64  HU (series 3/image 222), stable in size. No discrete left adrenal  nodules. No hydronephrosis. Simple bilateral renal cysts, largest  3.8 cm in the lower right kidney. Normal bladder.  Stomach/Bowel: Small hiatal hernia. Otherwise normal nondistended  stomach. Normal caliber small bowel with no small bowel wall  thickening. Appendectomy. Marked diffuse colonic diverticulosis,  most prominent in the sigmoid colon, with no large bowel wall  thickening or acute pericolonic fat stranding.  Vascular/Lymphatic: Atherosclerotic abdominal aorta with chronic  aortic dissection with dissection flap extending to the distal left  common iliac artery, unchanged. Maximum abdominal aortic diameter  2.9 cm, unchanged. Celiac trunk, SMA and right renal artery arise   from true lumen. Left renal artery arises from true and false  lumens. No pathologically enlarged lymph nodes in the abdomen or  pelvis.  Reproductive: Status post hysterectomy, with no abnormal findings at  the vaginal cuff. No adnexal mass.  Other: No pneumoperitoneum, ascites or focal fluid collection.  Musculoskeletal: No aggressive appearing focal osseous lesions. Mild  lumbar spondylosis.  VASCULAR MEASUREMENTS PERTINENT TO TAVR:  AORTA:  Minimal Aortic Diameter-12.2 x 8.8 mm  Severity of Aortic Calcification-moderate  RIGHT PELVIS:  Right Common Iliac Artery -  Minimal Diameter-10.1 x 9.7 mm  Tortuosity-mild  Calcification-mild  Right External Iliac Artery -  Minimal Diameter-6.9 x 6.5 mm  Tortuosity-mild-to-moderate  Calcification-none  Right Common Femoral Artery -  Minimal Diameter-7.4 x 7.0 mm  Tortuosity-mild  Calcification-mild  LEFT PELVIS:  Left Common Iliac Artery -  Minimal Diameter-9.8 x 7.8 mm (true lumen)  Tortuosity-mild  Calcification-mild  Left External Iliac Artery -  Minimal Diameter-6.6 x 6.5 mm  Tortuosity-mild  Calcification-none  Left Common Femoral Artery -  Minimal Diameter-6.8 x 6.8 mm  Tortuosity-mild  Calcification-none  Review of the MIP images confirms the above findings.  IMPRESSION:  1. Vascular findings and measurements pertinent to potential TAVR  procedure, as detailed.  2. Diffuse thickening and calcification of the aortic valve,  compatible with the reported history of severe aortic stenosis.  3. Chronic type A aortic dissection status post ascending thoracic  aortic repair, with dissection flap extending into left common iliac  artery, unchanged.  4. Moderate cardiomegaly.  5. Chronic mild splenomegaly.  6. Right adrenal nodule is stable in size since 06/28/2019 CT,  probably a benign adenoma. Follow-up CT abdomen without and with IV  contrast in 12 months suggested. This recommendation follows ACR  consensus  guidelines: Management of Incidental Adrenal Masses: A  White Paper of the ACR Incidental Findings Committee. J Am Coll  Radiol 2017;14:1038-1044.  7. Marked diffuse colonic diverticulosis.  8. Small hiatal hernia.  9. Aortic Atherosclerosis (ICD10-I70.0).  Electronically Signed  By: Ilona Sorrel M.D.  On: 12/31/2019 16:26    STS Risk Calculator:   Isolated AVR:  Risk of Mortality:  3.863%  Renal Failure:  5.460%  Permanent Stroke:  2.900%  Prolonged Ventilation:  13.945%  DSW Infection:  0.202%  Reoperation:  3.639%  Morbidity or Mortality:  18.051%  Short Length of Stay:  23.214%  Long Length of Stay:  12.710%   Impression:   This 74 year old woman has stage D, low flow, low gradient, severe aortic stenosis with [place]  Heart Association class II symptoms of exertional fatigue and shortness of breath with a reduced ejection fraction consistent with chronic combined systolic and diastolic congestive heart failure. I have personally reviewed her 2D echocardiogram, cardiac catheterization, and CTA studies. Her echocardiogram shows a severely calcified aortic valve with restricted leaflet mobility. It is not clear how many leaflet she has. The mean gradient across the aortic valve was 27 mmHg with a peak gradient of 48 mmHg. Dimensionless index was 0.2 consistent with severe aortic stenosis. Left ventricular ejection fraction was reduced to 45%. Cardiac catheterization shows mild nonobstructive coronary disease. The mean gradient across aortic valve was measured at 26 mmHg with a calculated aortic valve area of 0.8 cm. I agree that aortic valve replacement is indicated in this patient for relief of her progressive symptoms and to prevent progressive left ventricular deterioration. She would be at very high risk for open surgical aortic valve replacement and I think transcatheter aortic valve replacement would be the best option for her. Her gated cardiac CTA shows a trileaflet  aortic valve with anatomy suitable for transcatheter aortic valve replacement using a SAPIEN 3 valve. The CTA of the chest, abdomen, and pelvis shows a chronic type A and B dissection with persistent false lumen involving the aortic arch beyond the suture line just proximal to the innominate artery and extending throughout the entire aortic arch and descending thoracic aorta. I don't think she would be a candidate for transfemoral insertion and the carotid and subclavian arteries are also not good options since the dissection involves the entire aortic arch with a patent false lumen. I think the best option for her is transapical insertion.   The patient and her son were counseled at length regarding treatment alternatives for management of severe symptomatic aortic stenosis. The risks and benefits of surgical intervention has been discussed in detail. Long-term prognosis with medical therapy was discussed. Alternative approaches such as conventional surgical aortic valve replacement, transcatheter aortic valve replacement, and palliative medical therapy were compared and contrasted at length. This discussion was placed in the context of the patient's own specific clinical presentation and past medical history. All of their questions been addressed.  Following the decision to proceed with transcatheter aortic valve replacement, a discussion was held regarding what types of management strategies would be attempted intraoperatively in the event of life-threatening complications, including whether or not the patient would be considered a candidate for the use of cardiopulmonary bypass and/or conversion to open sternotomy for attempted surgical intervention. Given her prior aortic dissection repair and chronic dissection involving the aortic arch and descending thoracic aorta I don't think she would be a candidate for emergent sternotomy to manage any intraoperative complications.   The patient has been advised of  a variety of complications that might develop including but not limited to risks of death, stroke, paravalvular leak, aortic dissection or other major vascular complications, aortic annulus rupture, device embolization, cardiac rupture or perforation, mitral regurgitation, acute myocardial infarction, arrhythmia, heart block or bradycardia requiring permanent pacemaker placement, congestive heart failure, respiratory failure, renal failure, pneumonia, infection, other late complications related to structural valve deterioration or migration, or other complications that might ultimately cause a temporary or permanent loss of functional independence or other long term morbidity. The patient provides full informed consent for the procedure as described and all questions were answered.   Plan:   She will be scheduled for transapical transcatheter aortic valve replacement using a SAPIEN three valve.  Gaye Pollack, MD

## 2020-01-28 NOTE — Anesthesia Preprocedure Evaluation (Addendum)
Anesthesia Evaluation  Patient identified by MRN, date of birth, ID band Patient awake    Reviewed: Allergy & Precautions, NPO status , Patient's Chart, lab work & pertinent test results, reviewed documented beta blocker date and time   History of Anesthesia Complications Negative for: history of anesthetic complications  Airway Mallampati: II  TM Distance: >3 FB Neck ROM: Full    Dental  (+) Dental Advisory Given   Pulmonary former smoker,  01/25/2020 SARS coronavirus NEG   breath sounds clear to auscultation       Cardiovascular hypertension, Pt. on medications and Pt. on home beta blockers (-) angina+ Peripheral Vascular Disease (s/p thoracic aortic dissection: s/p asc aorta replacement)  + pacemaker + Cardiac Defibrillator (2020, for syncope, has never shocked) + Valvular Problems/Murmurs AS  Rhythm:Regular Rate:Normal  12/2019 Cath: patent coronaries, severe AS with mean grad 26 mmHg, AVA 0.8 cm2  11/2019 ECHO: EF 40-45%, septal and inferior hypokinesis, mild MR, mod TR, mild AI with severe AS with mean grad 26.5 mmHg, peak grad 48.2 mmHg, AVA 0.63 cm2   Neuro/Psych negative neurological ROS     GI/Hepatic Neg liver ROS, GERD  Controlled,  Endo/Other  diabetes (glu 103), Oral Hypoglycemic AgentsMorbid obesity  Renal/GU Renal InsufficiencyRenal disease (creat 1.25)     Musculoskeletal   Abdominal (+) + obese,   Peds  Hematology  (+) Blood dyscrasia (Hb 8.9), anemia ,   Anesthesia Other Findings   Reproductive/Obstetrics                           Anesthesia Physical Anesthesia Plan  ASA: IV  Anesthesia Plan: General   Post-op Pain Management:    Induction: Intravenous  PONV Risk Score and Plan: 3 and Ondansetron, Dexamethasone and Treatment may vary due to age or medical condition  Airway Management Planned: Oral ETT  Additional Equipment: Arterial line, CVP and Ultrasound  Guidance Line Placement  Intra-op Plan:   Post-operative Plan: Extubation in OR  Informed Consent: I have reviewed the patients History and Physical, chart, labs and discussed the procedure including the risks, benefits and alternatives for the proposed anesthesia with the patient or authorized representative who has indicated his/her understanding and acceptance.     Dental advisory given  Plan Discussed with: CRNA and Surgeon  Anesthesia Plan Comments: (Note from TAVR team: "[name] DOB 05/25/1946 said Versed makes her hyper. We also ordered for type and cross and 4 Units of blood for her case.")      Anesthesia Quick Evaluation

## 2020-01-29 ENCOUNTER — Inpatient Hospital Stay (HOSPITAL_COMMUNITY)
Admission: RE | Admit: 2020-01-29 | Discharge: 2020-02-01 | DRG: 266 | Disposition: A | Discharge status: Home Health | Payer: Medicare HMO | Attending: Surgery | Admitting: Surgery

## 2020-01-29 ENCOUNTER — Other Ambulatory Visit: Payer: Self-pay | Admitting: Physician Assistant

## 2020-01-29 ENCOUNTER — Encounter (HOSPITAL_COMMUNITY): Payer: Self-pay | Admitting: Cardiovascular Disease

## 2020-01-29 ENCOUNTER — Encounter (HOSPITAL_COMMUNITY)
Admission: RE | Disposition: A | Discharge status: Home Health | Payer: Self-pay | Source: Home / Self Care | Attending: Surgery

## 2020-01-29 ENCOUNTER — Inpatient Hospital Stay (HOSPITAL_COMMUNITY): Payer: Medicare HMO | Admitting: Physician Assistant

## 2020-01-29 ENCOUNTER — Other Ambulatory Visit: Payer: Self-pay

## 2020-01-29 ENCOUNTER — Inpatient Hospital Stay (HOSPITAL_COMMUNITY): Payer: Medicare HMO | Admitting: Certified Registered Nurse Anesthetist

## 2020-01-29 ENCOUNTER — Inpatient Hospital Stay (HOSPITAL_COMMUNITY): Payer: Medicare HMO

## 2020-01-29 ENCOUNTER — Other Ambulatory Visit: Payer: Self-pay | Admitting: Family Medicine

## 2020-01-29 DIAGNOSIS — Z9889 Other specified postprocedural states: Secondary | ICD-10-CM

## 2020-01-29 DIAGNOSIS — Z6835 Body mass index (BMI) 35.0-35.9, adult: Secondary | ICD-10-CM

## 2020-01-29 DIAGNOSIS — I495 Sick sinus syndrome: Secondary | ICD-10-CM | POA: Diagnosis present

## 2020-01-29 DIAGNOSIS — N1832 Chronic kidney disease, stage 3b: Secondary | ICD-10-CM | POA: Diagnosis present

## 2020-01-29 DIAGNOSIS — Z8249 Family history of ischemic heart disease and other diseases of the circulatory system: Secondary | ICD-10-CM | POA: Diagnosis not present

## 2020-01-29 DIAGNOSIS — E1122 Type 2 diabetes mellitus with diabetic chronic kidney disease: Secondary | ICD-10-CM | POA: Diagnosis present

## 2020-01-29 DIAGNOSIS — Z888 Allergy status to other drugs, medicaments and biological substances status: Secondary | ICD-10-CM

## 2020-01-29 DIAGNOSIS — K5730 Diverticulosis of large intestine without perforation or abscess without bleeding: Secondary | ICD-10-CM | POA: Diagnosis present

## 2020-01-29 DIAGNOSIS — Z841 Family history of disorders of kidney and ureter: Secondary | ICD-10-CM | POA: Diagnosis not present

## 2020-01-29 DIAGNOSIS — R161 Splenomegaly, not elsewhere classified: Secondary | ICD-10-CM | POA: Diagnosis present

## 2020-01-29 DIAGNOSIS — F419 Anxiety disorder, unspecified: Secondary | ICD-10-CM

## 2020-01-29 DIAGNOSIS — E1151 Type 2 diabetes mellitus with diabetic peripheral angiopathy without gangrene: Secondary | ICD-10-CM | POA: Diagnosis present

## 2020-01-29 DIAGNOSIS — N183 Chronic kidney disease, stage 3 unspecified: Secondary | ICD-10-CM | POA: Diagnosis not present

## 2020-01-29 DIAGNOSIS — I7102 Dissection of abdominal aorta: Secondary | ICD-10-CM | POA: Diagnosis present

## 2020-01-29 DIAGNOSIS — K649 Unspecified hemorrhoids: Secondary | ICD-10-CM | POA: Diagnosis present

## 2020-01-29 DIAGNOSIS — R519 Headache, unspecified: Secondary | ICD-10-CM | POA: Diagnosis present

## 2020-01-29 DIAGNOSIS — Z885 Allergy status to narcotic agent status: Secondary | ICD-10-CM | POA: Diagnosis not present

## 2020-01-29 DIAGNOSIS — D649 Anemia, unspecified: Secondary | ICD-10-CM | POA: Diagnosis present

## 2020-01-29 DIAGNOSIS — E118 Type 2 diabetes mellitus with unspecified complications: Secondary | ICD-10-CM | POA: Diagnosis present

## 2020-01-29 DIAGNOSIS — M549 Dorsalgia, unspecified: Secondary | ICD-10-CM | POA: Diagnosis not present

## 2020-01-29 DIAGNOSIS — I10 Essential (primary) hypertension: Secondary | ICD-10-CM | POA: Diagnosis present

## 2020-01-29 DIAGNOSIS — Z882 Allergy status to sulfonamides status: Secondary | ICD-10-CM | POA: Diagnosis not present

## 2020-01-29 DIAGNOSIS — Z952 Presence of prosthetic heart valve: Secondary | ICD-10-CM

## 2020-01-29 DIAGNOSIS — I472 Ventricular tachycardia, unspecified: Secondary | ICD-10-CM

## 2020-01-29 DIAGNOSIS — I4892 Unspecified atrial flutter: Secondary | ICD-10-CM | POA: Diagnosis present

## 2020-01-29 DIAGNOSIS — J939 Pneumothorax, unspecified: Secondary | ICD-10-CM | POA: Diagnosis not present

## 2020-01-29 DIAGNOSIS — Z7984 Long term (current) use of oral hypoglycemic drugs: Secondary | ICD-10-CM

## 2020-01-29 DIAGNOSIS — K219 Gastro-esophageal reflux disease without esophagitis: Secondary | ICD-10-CM | POA: Diagnosis not present

## 2020-01-29 DIAGNOSIS — I340 Nonrheumatic mitral (valve) insufficiency: Secondary | ICD-10-CM | POA: Diagnosis not present

## 2020-01-29 DIAGNOSIS — I130 Hypertensive heart and chronic kidney disease with heart failure and stage 1 through stage 4 chronic kidney disease, or unspecified chronic kidney disease: Secondary | ICD-10-CM | POA: Diagnosis present

## 2020-01-29 DIAGNOSIS — E119 Type 2 diabetes mellitus without complications: Secondary | ICD-10-CM | POA: Diagnosis present

## 2020-01-29 DIAGNOSIS — F329 Major depressive disorder, single episode, unspecified: Secondary | ICD-10-CM | POA: Diagnosis present

## 2020-01-29 DIAGNOSIS — I350 Nonrheumatic aortic (valve) stenosis: Secondary | ICD-10-CM | POA: Diagnosis not present

## 2020-01-29 DIAGNOSIS — IMO0002 Reserved for concepts with insufficient information to code with codable children: Secondary | ICD-10-CM | POA: Diagnosis present

## 2020-01-29 DIAGNOSIS — Z79899 Other long term (current) drug therapy: Secondary | ICD-10-CM

## 2020-01-29 DIAGNOSIS — E785 Hyperlipidemia, unspecified: Secondary | ICD-10-CM | POA: Diagnosis not present

## 2020-01-29 DIAGNOSIS — I7101 Dissection of thoracic aorta: Secondary | ICD-10-CM | POA: Diagnosis present

## 2020-01-29 DIAGNOSIS — I700 Atherosclerosis of aorta: Secondary | ICD-10-CM | POA: Diagnosis present

## 2020-01-29 DIAGNOSIS — K449 Diaphragmatic hernia without obstruction or gangrene: Secondary | ICD-10-CM | POA: Diagnosis present

## 2020-01-29 DIAGNOSIS — I5043 Acute on chronic combined systolic (congestive) and diastolic (congestive) heart failure: Secondary | ICD-10-CM | POA: Diagnosis not present

## 2020-01-29 DIAGNOSIS — J90 Pleural effusion, not elsewhere classified: Secondary | ICD-10-CM | POA: Diagnosis not present

## 2020-01-29 DIAGNOSIS — I129 Hypertensive chronic kidney disease with stage 1 through stage 4 chronic kidney disease, or unspecified chronic kidney disease: Secondary | ICD-10-CM | POA: Diagnosis not present

## 2020-01-29 DIAGNOSIS — Z87891 Personal history of nicotine dependence: Secondary | ICD-10-CM | POA: Diagnosis not present

## 2020-01-29 DIAGNOSIS — E278 Other specified disorders of adrenal gland: Secondary | ICD-10-CM | POA: Diagnosis present

## 2020-01-29 DIAGNOSIS — Z954 Presence of other heart-valve replacement: Secondary | ICD-10-CM | POA: Diagnosis not present

## 2020-01-29 DIAGNOSIS — Z95810 Presence of automatic (implantable) cardiac defibrillator: Secondary | ICD-10-CM | POA: Diagnosis present

## 2020-01-29 DIAGNOSIS — Z20822 Contact with and (suspected) exposure to covid-19: Secondary | ICD-10-CM | POA: Diagnosis present

## 2020-01-29 DIAGNOSIS — Z006 Encounter for examination for normal comparison and control in clinical research program: Secondary | ICD-10-CM | POA: Diagnosis not present

## 2020-01-29 DIAGNOSIS — Z9689 Presence of other specified functional implants: Secondary | ICD-10-CM

## 2020-01-29 DIAGNOSIS — I71019 Dissection of thoracic aorta, unspecified: Secondary | ICD-10-CM | POA: Diagnosis present

## 2020-01-29 DIAGNOSIS — Z950 Presence of cardiac pacemaker: Secondary | ICD-10-CM

## 2020-01-29 HISTORY — DX: Presence of prosthetic heart valve: Z95.2

## 2020-01-29 HISTORY — PX: TRANSCATHETER AORTIC VALVE REPLACEMENT, TRANSAPICAL: SHX6401

## 2020-01-29 HISTORY — PX: TEE WITHOUT CARDIOVERSION: SHX5443

## 2020-01-29 LAB — POCT I-STAT, CHEM 8
BUN: 39 mg/dL — ABNORMAL HIGH (ref 8–23)
Calcium, Ion: 1.25 mmol/L (ref 1.15–1.40)
Chloride: 108 mmol/L (ref 98–111)
Creatinine, Ser: 1.2 mg/dL — ABNORMAL HIGH (ref 0.44–1.00)
Glucose, Bld: 146 mg/dL — ABNORMAL HIGH (ref 70–99)
HCT: 30 % — ABNORMAL LOW (ref 36.0–46.0)
Hemoglobin: 10.2 g/dL — ABNORMAL LOW (ref 12.0–15.0)
Potassium: 4.8 mmol/L (ref 3.5–5.1)
Sodium: 137 mmol/L (ref 135–145)
TCO2: 21 mmol/L — ABNORMAL LOW (ref 22–32)

## 2020-01-29 LAB — GLUCOSE, CAPILLARY
Glucose-Capillary: 112 mg/dL — ABNORMAL HIGH (ref 70–99)
Glucose-Capillary: 158 mg/dL — ABNORMAL HIGH (ref 70–99)
Glucose-Capillary: 155 mg/dL — ABNORMAL HIGH (ref 70–99)

## 2020-01-29 SURGERY — REPLACEMENT, AORTIC VALVE, TRANSAPICAL APPROACH
Anesthesia: General

## 2020-01-29 MED ORDER — SODIUM CHLORIDE (PF) 0.9 % IJ SOLN
INTRAMUSCULAR | Status: AC
  Filled 2020-01-29: qty 20

## 2020-01-29 MED ORDER — FENTANYL CITRATE (PF) 250 MCG/5ML IJ SOLN
INTRAMUSCULAR | Status: AC
  Filled 2020-01-29: qty 5

## 2020-01-29 MED ORDER — CHLORHEXIDINE GLUCONATE CLOTH 2 % EX PADS
6.0000 | MEDICATED_PAD | Freq: Every day | CUTANEOUS | Status: DC
  Administered 2020-01-29: 6 via TOPICAL

## 2020-01-29 MED ORDER — CITALOPRAM HYDROBROMIDE 20 MG PO TABS: 60.0000 mg | ORAL_TABLET | Freq: Every day | ORAL | Status: DC

## 2020-01-29 MED ORDER — FERROUS SULFATE 325 (65 FE) MG PO TABS
325.0000 mg | ORAL_TABLET | Freq: Every day | ORAL | Status: DC
  Administered 2020-01-30 – 2020-02-01 (×3): 325 mg via ORAL
  Filled 2020-01-29 (×3): qty 1

## 2020-01-29 MED ORDER — SODIUM CHLORIDE 0.9 % IV SOLN: INTRAVENOUS | Status: DC

## 2020-01-29 MED ORDER — LIDOCAINE 2% (20 MG/ML) 5 ML SYRINGE
INTRAMUSCULAR | Status: DC | PRN
  Administered 2020-01-29: 20 mg via INTRAVENOUS

## 2020-01-29 MED ORDER — CHLORHEXIDINE GLUCONATE 0.12 % MT SOLN
OROMUCOSAL | Status: AC
  Administered 2020-01-29: 15 mL via OROMUCOSAL
  Filled 2020-01-29: qty 15

## 2020-01-29 MED ORDER — SODIUM CHLORIDE 0.9 % IV SOLN
250.0000 mL | INTRAVENOUS | Status: DC | PRN
  Administered 2020-01-30: 250 mL via INTRAVENOUS

## 2020-01-29 MED ORDER — HEPARIN SODIUM (PORCINE) 1000 UNIT/ML IJ SOLN
INTRAMUSCULAR | Status: DC | PRN
  Administered 2020-01-29: 11000 [IU] via INTRAVENOUS

## 2020-01-29 MED ORDER — ACETAMINOPHEN 650 MG RE SUPP: 650.0000 mg | Freq: Four times a day (QID) | RECTAL | Status: DC | PRN

## 2020-01-29 MED ORDER — SODIUM CHLORIDE 0.9 % IV SOLN
1.5000 g | Freq: Two times a day (BID) | INTRAVENOUS | Status: AC
  Administered 2020-01-29 – 2020-01-31 (×4): 1.5 g via INTRAVENOUS
  Filled 2020-01-29 (×7): qty 1.5

## 2020-01-29 MED ORDER — CHLORHEXIDINE GLUCONATE 4 % EX LIQD: 60.0000 mL | Freq: Once | CUTANEOUS | Status: DC

## 2020-01-29 MED ORDER — SUGAMMADEX SODIUM 200 MG/2ML IV SOLN
INTRAVENOUS | Status: DC | PRN
  Administered 2020-01-29: 180 mg via INTRAVENOUS

## 2020-01-29 MED ORDER — LIDOCAINE HCL (PF) 1 % IJ SOLN
INTRAMUSCULAR | Status: AC
  Filled 2020-01-29: qty 30

## 2020-01-29 MED ORDER — LACTATED RINGERS IV SOLN: INTRAVENOUS | Status: DC | PRN

## 2020-01-29 MED ORDER — PHENYLEPHRINE HCL-NACL 20-0.9 MG/250ML-% IV SOLN
0.0000 ug/min | INTRAVENOUS | Status: DC
  Filled 2020-01-29: qty 250

## 2020-01-29 MED ORDER — HYDRALAZINE HCL 20 MG/ML IJ SOLN
10.0000 mg | INTRAMUSCULAR | Status: DC | PRN
  Administered 2020-01-29: 10 mg via INTRAVENOUS

## 2020-01-29 MED ORDER — NITROGLYCERIN 0.2 MG/ML ON CALL CATH LAB
INTRAVENOUS | Status: DC | PRN
  Administered 2020-01-29: 10 ug via INTRAVENOUS
  Administered 2020-01-29: 40 ug via INTRAVENOUS
  Administered 2020-01-29: 20 ug via INTRAVENOUS

## 2020-01-29 MED ORDER — DEXAMETHASONE SODIUM PHOSPHATE 10 MG/ML IJ SOLN
INTRAMUSCULAR | Status: AC
  Filled 2020-01-29: qty 1

## 2020-01-29 MED ORDER — LIDOCAINE 2% (20 MG/ML) 5 ML SYRINGE
INTRAMUSCULAR | Status: AC
  Filled 2020-01-29: qty 5

## 2020-01-29 MED ORDER — ASPIRIN 81 MG PO CHEW
81.0000 mg | CHEWABLE_TABLET | Freq: Every day | ORAL | Status: DC
  Administered 2020-01-30 – 2020-02-01 (×3): 81 mg via ORAL
  Filled 2020-01-29 (×3): qty 1

## 2020-01-29 MED ORDER — MORPHINE SULFATE (PF) 2 MG/ML IV SOLN
INTRAVENOUS | Status: AC
  Filled 2020-01-29: qty 1

## 2020-01-29 MED ORDER — MIDAZOLAM HCL 2 MG/2ML IJ SOLN
INTRAMUSCULAR | Status: AC
  Filled 2020-01-29: qty 2

## 2020-01-29 MED ORDER — ROCURONIUM BROMIDE 10 MG/ML (PF) SYRINGE
PREFILLED_SYRINGE | INTRAVENOUS | Status: DC | PRN
  Administered 2020-01-29: 40 mg via INTRAVENOUS
  Administered 2020-01-29: 20 mg via INTRAVENOUS
  Administered 2020-01-29: 60 mg via INTRAVENOUS

## 2020-01-29 MED ORDER — MORPHINE SULFATE (PF) 4 MG/ML IV SOLN
INTRAVENOUS | Status: AC
  Filled 2020-01-29: qty 1

## 2020-01-29 MED ORDER — ONDANSETRON HCL 4 MG/2ML IJ SOLN
INTRAMUSCULAR | Status: DC | PRN
  Administered 2020-01-29: 4 mg via INTRAVENOUS

## 2020-01-29 MED ORDER — ROCURONIUM BROMIDE 10 MG/ML (PF) SYRINGE
PREFILLED_SYRINGE | INTRAVENOUS | Status: AC
  Filled 2020-01-29: qty 10

## 2020-01-29 MED ORDER — IODIXANOL 320 MG/ML IV SOLN
INTRAVENOUS | Status: DC | PRN
  Administered 2020-01-29: 50 mL via INTRA_ARTERIAL

## 2020-01-29 MED ORDER — FENTANYL CITRATE (PF) 100 MCG/2ML IJ SOLN
INTRAMUSCULAR | Status: AC
  Filled 2020-01-29: qty 2

## 2020-01-29 MED ORDER — PROPOFOL 10 MG/ML IV BOLUS
INTRAVENOUS | Status: AC
  Filled 2020-01-29: qty 20

## 2020-01-29 MED ORDER — VANCOMYCIN HCL IN DEXTROSE 1-5 GM/200ML-% IV SOLN
1000.0000 mg | Freq: Once | INTRAVENOUS | Status: AC
  Administered 2020-01-29: 1000 mg via INTRAVENOUS
  Filled 2020-01-29: qty 200

## 2020-01-29 MED ORDER — ALPRAZOLAM 0.5 MG PO TABS
0.5000 mg | ORAL_TABLET | Freq: Three times a day (TID) | ORAL | Status: DC | PRN
  Administered 2020-01-29 – 2020-02-01 (×7): 0.5 mg via ORAL
  Filled 2020-01-29 (×7): qty 1

## 2020-01-29 MED ORDER — MIDAZOLAM HCL 2 MG/2ML IJ SOLN
INTRAMUSCULAR | Status: DC | PRN
  Administered 2020-01-29: 1 mg via INTRAVENOUS

## 2020-01-29 MED ORDER — CLOPIDOGREL BISULFATE 75 MG PO TABS
75.0000 mg | ORAL_TABLET | Freq: Every day | ORAL | Status: DC
  Administered 2020-01-30 – 2020-02-01 (×3): 75 mg via ORAL
  Filled 2020-01-29 (×3): qty 1

## 2020-01-29 MED ORDER — OXYCODONE HCL 5 MG PO TABS
5.0000 mg | ORAL_TABLET | ORAL | Status: DC | PRN
  Administered 2020-01-29 – 2020-02-01 (×13): 10 mg via ORAL
  Filled 2020-01-29 (×12): qty 2

## 2020-01-29 MED ORDER — MORPHINE SULFATE (PF) 10 MG/ML IV SOLN
1.0000 mg | INTRAVENOUS | Status: DC | PRN
  Administered 2020-01-29: 2 mg via INTRAVENOUS
  Administered 2020-01-29: 4 mg via INTRAVENOUS
  Administered 2020-01-29: 2 mg via INTRAVENOUS

## 2020-01-29 MED ORDER — HYDRALAZINE HCL 20 MG/ML IJ SOLN
INTRAMUSCULAR | Status: AC
  Filled 2020-01-29: qty 1

## 2020-01-29 MED ORDER — SODIUM CHLORIDE 0.9% FLUSH
3.0000 mL | Freq: Two times a day (BID) | INTRAVENOUS | Status: DC
  Administered 2020-01-29 – 2020-02-01 (×6): 3 mL via INTRAVENOUS

## 2020-01-29 MED ORDER — PROTAMINE SULFATE 10 MG/ML IV SOLN
INTRAVENOUS | Status: DC | PRN
  Administered 2020-01-29: 20 mg via INTRAVENOUS
  Administered 2020-01-29: 90 mg via INTRAVENOUS

## 2020-01-29 MED ORDER — DEXAMETHASONE SODIUM PHOSPHATE 10 MG/ML IJ SOLN
INTRAMUSCULAR | Status: DC | PRN
  Administered 2020-01-29: 5 mg via INTRAVENOUS

## 2020-01-29 MED ORDER — CHLORHEXIDINE GLUCONATE 4 % EX LIQD: 30.0000 mL | CUTANEOUS | Status: DC

## 2020-01-29 MED ORDER — HEPARIN (PORCINE) IN NACL 1000-0.9 UT/500ML-% IV SOLN
INTRAVENOUS | Status: AC
  Filled 2020-01-29: qty 500

## 2020-01-29 MED ORDER — SODIUM CHLORIDE 0.9 % IV SOLN
INTRAVENOUS | Status: AC
  Filled 2020-01-29 (×3): qty 1.2

## 2020-01-29 MED ORDER — CHLORHEXIDINE GLUCONATE 0.12 % MT SOLN: 15.0000 mL | Freq: Once | OROMUCOSAL | Status: AC

## 2020-01-29 MED ORDER — NITROGLYCERIN IN D5W 200-5 MCG/ML-% IV SOLN: 0.0000 ug/min | INTRAVENOUS | Status: DC

## 2020-01-29 MED ORDER — SODIUM CHLORIDE 0.9% FLUSH
3.0000 mL | INTRAVENOUS | Status: DC | PRN
  Administered 2020-01-29: 3 mL via INTRAVENOUS

## 2020-01-29 MED ORDER — FENTANYL CITRATE (PF) 250 MCG/5ML IJ SOLN
INTRAMUSCULAR | Status: DC | PRN
  Administered 2020-01-29 (×5): 50 ug via INTRAVENOUS

## 2020-01-29 MED ORDER — ONDANSETRON HCL 4 MG/2ML IJ SOLN
INTRAMUSCULAR | Status: AC
  Filled 2020-01-29: qty 2

## 2020-01-29 MED ORDER — NOREPINEPHRINE 4 MG/250ML-% IV SOLN
INTRAVENOUS | Status: DC | PRN
  Administered 2020-01-29: 2 ug/min via INTRAVENOUS

## 2020-01-29 MED ORDER — OXYCODONE HCL 5 MG PO TABS
ORAL_TABLET | ORAL | Status: AC
  Filled 2020-01-29: qty 2

## 2020-01-29 MED ORDER — MORPHINE SULFATE (PF) 2 MG/ML IV SOLN
1.0000 mg | INTRAVENOUS | Status: DC | PRN
  Administered 2020-01-29: 2 mg via INTRAVENOUS
  Administered 2020-01-29: 4 mg via INTRAVENOUS
  Filled 2020-01-29 (×2): qty 1
  Filled 2020-01-29: qty 2

## 2020-01-29 MED ORDER — ACETAMINOPHEN 325 MG PO TABS: 650.0000 mg | ORAL_TABLET | Freq: Four times a day (QID) | ORAL | Status: DC | PRN

## 2020-01-29 MED ORDER — SODIUM CHLORIDE 0.9 % IV SOLN: INTRAVENOUS | Status: AC

## 2020-01-29 MED ORDER — ROSUVASTATIN CALCIUM 20 MG PO TABS
20.0000 mg | ORAL_TABLET | Freq: Every day | ORAL | Status: DC
  Administered 2020-01-29 – 2020-02-01 (×4): 20 mg via ORAL
  Filled 2020-01-29 (×4): qty 1

## 2020-01-29 MED ORDER — SODIUM CHLORIDE 0.9 % IV SOLN
INTRAVENOUS | Status: DC | PRN
  Administered 2020-01-29 (×3): 500 mL

## 2020-01-29 MED ORDER — FLUTICASONE PROPIONATE 50 MCG/ACT NA SUSP
2.0000 | Freq: Every day | NASAL | Status: DC | PRN
  Filled 2020-01-29: qty 16

## 2020-01-29 MED ORDER — PROPOFOL 10 MG/ML IV BOLUS
INTRAVENOUS | Status: DC | PRN
  Administered 2020-01-29: 100 mg via INTRAVENOUS

## 2020-01-29 MED ORDER — TRAMADOL HCL 50 MG PO TABS
50.0000 mg | ORAL_TABLET | Freq: Two times a day (BID) | ORAL | Status: DC | PRN
  Administered 2020-01-30: 100 mg via ORAL
  Filled 2020-01-29: qty 2

## 2020-01-29 MED ORDER — INSULIN ASPART 100 UNIT/ML ~~LOC~~ SOLN
0.0000 [IU] | Freq: Three times a day (TID) | SUBCUTANEOUS | Status: DC
  Administered 2020-01-29 (×2): 2 [IU] via SUBCUTANEOUS

## 2020-01-29 MED ORDER — ONDANSETRON HCL 4 MG/2ML IJ SOLN: 4.0000 mg | Freq: Four times a day (QID) | INTRAMUSCULAR | Status: DC | PRN

## 2020-01-29 SURGICAL SUPPLY — 103 items
ADH SKN CLS APL DERMABOND .7 (GAUZE/BANDAGES/DRESSINGS) ×1
BAG DECANTER FOR FLEXI CONT (MISCELLANEOUS) IMPLANT
BAG SNAP BAND KOVER 36X36 (MISCELLANEOUS) ×2 IMPLANT
BLADE CLIPPER SURG (BLADE) IMPLANT
BLADE OSCILLATING /SAGITTAL (BLADE) IMPLANT
BLADE STERNUM SYSTEM 6 (BLADE) IMPLANT
CABLE ADAPT CONN TEMP 6FT (ADAPTER) ×2 IMPLANT
CANNULA FEM VENOUS REMOTE 22FR (CANNULA) IMPLANT
CANNULA OPTISITE PERFUSION 16F (CANNULA) IMPLANT
CANNULA OPTISITE PERFUSION 18F (CANNULA) IMPLANT
CATH DIAG EXPO 6F AL1 (CATHETERS) IMPLANT
CATH DIAG EXPO 6F FR4 (CATHETERS) IMPLANT
CATH DIAG EXPO 6F VENT PIG 145 (CATHETERS) ×4 IMPLANT
CATH EXTERNAL FEMALE PUREWICK (CATHETERS) IMPLANT
CATH FOLEY LATEX FREE 16FR (CATHETERS) ×2
CATH FOLEY LF 16FR (CATHETERS) IMPLANT
CATH INFINITI 6F AL2 (CATHETERS) IMPLANT
CATH S G BIP PACING (CATHETERS) ×2 IMPLANT
CLIP VESOCCLUDE MED 24/CT (CLIP) IMPLANT
CLIP VESOCCLUDE SM WIDE 24/CT (CLIP) IMPLANT
CLOSURE MYNX CONTROL 6F/7F (Vascular Products) ×1 IMPLANT
CNTNR URN SCR LID CUP LEK RST (MISCELLANEOUS) ×2 IMPLANT
CONT SPEC 4OZ STRL OR WHT (MISCELLANEOUS) ×4
COVER BACK TABLE 24X17X13 BIG (DRAPES) IMPLANT
COVER BACK TABLE 80X110 HD (DRAPES) ×4 IMPLANT
COVER DOME SNAP 22 D (MISCELLANEOUS) IMPLANT
COVER WAND RF STERILE (DRAPES) ×2 IMPLANT
DERMABOND ADVANCED (GAUZE/BANDAGES/DRESSINGS) ×1
DERMABOND ADVANCED .7 DNX12 (GAUZE/BANDAGES/DRESSINGS) ×1 IMPLANT
DEVICE CLOSURE PERCLS PRGLD 6F (VASCULAR PRODUCTS) ×2 IMPLANT
DRAIN CHANNEL 28F RND 3/8 FF (WOUND CARE) ×1 IMPLANT
DRAPE INCISE IOBAN 66X45 STRL (DRAPES) IMPLANT
DRAPE SURG IRRIG POUCH 19X23 (DRAPES) ×1 IMPLANT
DRAPE SURG ISO 125X83 STRL (DRAPES) IMPLANT
DRSG AQUACEL AG ADV 3.5X 6 (GAUZE/BANDAGES/DRESSINGS) ×1 IMPLANT
DRSG TEGADERM 2-3/8X2-3/4 SM (GAUZE/BANDAGES/DRESSINGS) ×1 IMPLANT
DRSG TEGADERM 4X4.75 (GAUZE/BANDAGES/DRESSINGS) ×4 IMPLANT
ELECT CAUTERY BLADE 6.4 (BLADE) IMPLANT
ELECT REM PT RETURN 9FT ADLT (ELECTROSURGICAL) ×4
ELECTRODE REM PT RTRN 9FT ADLT (ELECTROSURGICAL) ×2 IMPLANT
FELT TEFLON 6X6 (MISCELLANEOUS) IMPLANT
FEMORAL VENOUS CANN RAP (CANNULA) IMPLANT
GAUZE SPONGE 4X4 12PLY STRL (GAUZE/BANDAGES/DRESSINGS) ×2 IMPLANT
GLOVE BIO SURGEON STRL SZ7.5 (GLOVE) IMPLANT
GLOVE BIO SURGEON STRL SZ8 (GLOVE) IMPLANT
GLOVE BIOGEL PI IND STRL 7.5 (GLOVE) IMPLANT
GLOVE BIOGEL PI INDICATOR 7.5 (GLOVE) ×2
GLOVE EUDERMIC 7 POWDERFREE (GLOVE) IMPLANT
GLOVE ORTHO TXT STRL SZ7.5 (GLOVE) IMPLANT
GOWN STRL REUS W/ TWL LRG LVL3 (GOWN DISPOSABLE) IMPLANT
GOWN STRL REUS W/ TWL XL LVL3 (GOWN DISPOSABLE) ×1 IMPLANT
GOWN STRL REUS W/TWL LRG LVL3 (GOWN DISPOSABLE) ×8
GOWN STRL REUS W/TWL XL LVL3 (GOWN DISPOSABLE) ×4
GUIDEWIRE SAFE TJ AMPLATZ EXST (WIRE) ×2 IMPLANT
INSERT FOGARTY SM (MISCELLANEOUS) IMPLANT
KIT BASIN OR (CUSTOM PROCEDURE TRAY) ×2 IMPLANT
KIT DILATOR VASC 18G NDL (KITS) IMPLANT
KIT HEART LEFT (KITS) ×2 IMPLANT
KIT SUCTION CATH 14FR (SUCTIONS) IMPLANT
KIT TURNOVER KIT B (KITS) ×2 IMPLANT
LOOP VESSEL MAXI BLUE (MISCELLANEOUS) IMPLANT
LOOP VESSEL MINI RED (MISCELLANEOUS) IMPLANT
NDL PERC 18GX7CM (NEEDLE) ×1 IMPLANT
NEEDLE 22X1 1/2 (OR ONLY) (NEEDLE) IMPLANT
NEEDLE PERC 18GX7CM (NEEDLE) ×2 IMPLANT
NS IRRIG 1000ML POUR BTL (IV SOLUTION) ×6 IMPLANT
PACK ENDO MINOR (CUSTOM PROCEDURE TRAY) ×1 IMPLANT
PACK ENDOVASCULAR (PACKS) ×2 IMPLANT
PAD ARMBOARD 7.5X6 YLW CONV (MISCELLANEOUS) ×4 IMPLANT
PAD ELECT DEFIB RADIOL ZOLL (MISCELLANEOUS) ×2 IMPLANT
PENCIL BUTTON HOLSTER BLD 10FT (ELECTRODE) ×2 IMPLANT
PERCLOSE PROGLIDE 6F (VASCULAR PRODUCTS) ×4
POSITIONER HEAD DONUT 9IN (MISCELLANEOUS) ×2 IMPLANT
SET MICROPUNCTURE 5F STIFF (MISCELLANEOUS) ×2 IMPLANT
SHEATH BRITE TIP 6FR 35CM (SHEATH) ×2 IMPLANT
SHEATH PINNACLE 6F 10CM (SHEATH) ×3 IMPLANT
SHEATH PINNACLE 8F 10CM (SHEATH) ×2 IMPLANT
SLEEVE REPOSITIONING LENGTH 30 (MISCELLANEOUS) ×2 IMPLANT
SPONGE LAP 4X18 RFD (DISPOSABLE) IMPLANT
STOPCOCK MORSE 400PSI 3WAY (MISCELLANEOUS) ×4 IMPLANT
SUT ETHIBOND X763 2 0 SH 1 (SUTURE) IMPLANT
SUT GORETEX CV 4 TH 22 36 (SUTURE) IMPLANT
SUT GORETEX CV4 TH-18 (SUTURE) IMPLANT
SUT MNCRL AB 3-0 PS2 18 (SUTURE) IMPLANT
SUT PROLENE 5 0 C 1 36 (SUTURE) IMPLANT
SUT PROLENE 6 0 C 1 30 (SUTURE) IMPLANT
SUT SILK  1 MH (SUTURE) ×2
SUT SILK 1 MH (SUTURE) ×1 IMPLANT
SUT VIC AB 2-0 CT1 27 (SUTURE)
SUT VIC AB 2-0 CT1 TAPERPNT 27 (SUTURE) IMPLANT
SUT VIC AB 2-0 CTX 36 (SUTURE) IMPLANT
SUT VIC AB 3-0 SH 8-18 (SUTURE) IMPLANT
SUT VICRYL 2 TP 1 (SUTURE) ×1 IMPLANT
SYR 50ML LL SCALE MARK (SYRINGE) ×2 IMPLANT
SYR BULB IRRIGATION 50ML (SYRINGE) IMPLANT
SYR CONTROL 10ML LL (SYRINGE) IMPLANT
TOWEL GREEN STERILE (TOWEL DISPOSABLE) ×2 IMPLANT
TOWEL GREEN STERILE FF (TOWEL DISPOSABLE) ×2 IMPLANT
TRANSDUCER W/STOPCOCK (MISCELLANEOUS) ×4 IMPLANT
VALVE HRT TRANSCATH CERT 23MM (Valve) ×1 IMPLANT
WIRE EMERALD 3MM-J .035X150CM (WIRE) ×2 IMPLANT
WIRE EMERALD 3MM-J .035X260CM (WIRE) ×2 IMPLANT
YANKAUER SUCT BULB TIP NO VENT (SUCTIONS) ×1 IMPLANT

## 2020-01-29 NOTE — Telephone Encounter (Signed)
Last written: 01/02/20 Last ov: 08/30/19 Next ov: none Contract: due UDS: due

## 2020-01-29 NOTE — Anesthesia Procedure Notes (Addendum)
Arterial Line Insertion Start/End3/12/2019 7:10 AM, 01/29/2020 7:20 AM Performed by: Bryson Corona, CRNA, CRNA  Patient location: Pre-op. Preanesthetic checklist: patient identified, IV checked, site marked, risks and benefits discussed, surgical consent, monitors and equipment checked, pre-op evaluation, timeout performed and anesthesia consent Lidocaine 1% used for infiltration and patient sedated Left, radial was placed Catheter size: 20 G Hand hygiene performed  and maximum sterile barriers used   Attempts: 1 Procedure performed without using ultrasound guided technique. Ultrasound Notes:anatomy identified and no ultrasound evidence of intravascular and/or intraneural injection Following insertion, Biopatch and dressing applied. Post procedure assessment: normal  Patient tolerated the procedure well with no immediate complications.

## 2020-01-29 NOTE — Progress Notes (Addendum)
Patient has a significant rash on her bilateral groin.  Patient stated that Dr. Burt Knack is aware of the rash.  Dr. Burt Knack was also ok with the patient taking her Coreg this morning before the procedure.     Notified [employer] Rep Tomi Bamberger of patients procedure. She stated that someone will be here soon for the procedure.   7:10 AM  spoke to Dr. Burt Knack to make sure he was aware of rash.

## 2020-01-29 NOTE — Anesthesia Procedure Notes (Signed)

## 2020-01-29 NOTE — Anesthesia Procedure Notes (Signed)
Procedure Name: Intubation Date/Time: 01/29/2020 8:12 AM Performed by: Bryson Corona, CRNA Pre-anesthesia Checklist: Patient identified, Emergency Drugs available, Suction available and Patient being monitored Patient Re-evaluated:Patient Re-evaluated prior to induction Oxygen Delivery Method: Circle System Utilized Preoxygenation: Pre-oxygenation with 100% oxygen Induction Type: IV induction Ventilation: Mask ventilation without difficulty Laryngoscope Size: Mac and 3 Grade View: Grade II Tube type: Oral Tube size: 7.5 mm Number of attempts: 1 Airway Equipment and Method: Stylet and Oral airway Placement Confirmation: ETT inserted through vocal cords under direct vision,  positive ETCO2 and breath sounds checked- equal and bilateral Tube secured with: Tape Dental Injury: Teeth and Oropharynx as per pre-operative assessment  Comments: [place] performed

## 2020-01-29 NOTE — Transfer of Care (Signed)
Immediate Anesthesia Transfer of Care Note  Patient: [name]  Procedure(s) Performed: TRANSCATHETER AORTIC VALVE REPLACEMENT, TRANSAPICAL using a Edwards Sapien 3, 26mm  Transcatheter Heart Valve (N/A ) TRANSESOPHAGEAL ECHOCARDIOGRAM (TEE) (N/A )  Patient Location: Cath Lab  Anesthesia Type:General  Level of Consciousness: awake and alert   Airway & Oxygen Therapy: Patient Spontanous Breathing and Patient connected to face mask oxygen  Post-op Assessment: Report given to RN and Post -op Vital signs reviewed and stable  Post vital signs: Reviewed and stable  Last Vitals:  Vitals Value Taken Time  BP 125/68   Temp    Pulse 59 01/29/20 1107  Resp 23 01/29/20 1107  SpO2 99 % 01/29/20 1107  Vitals shown include unvalidated device data.  Last Pain:  Vitals:   01/29/20 0646  TempSrc:   PainSc: 0-No pain      Patients Stated Pain Goal: 3 ([id number])  Complications: No apparent anesthesia complications

## 2020-01-29 NOTE — Progress Notes (Signed)
  Echocardiogram Echocardiogram Transesophageal has been performed.  [name] M 01/29/2020, 10:08 AM

## 2020-01-29 NOTE — Interval H&P Note (Signed)
History and Physical Interval Note:  01/29/2020 6:48 AM  [name]  has presented today for surgery, with the diagnosis of Severe Aortic Stenosis.  The various methods of treatment have been discussed with the patient and family. After consideration of risks, benefits and other options for treatment, the patient has consented to  Procedure(s): TRANSCATHETER AORTIC VALVE REPLACEMENT, TRANSAPICAL (N/A) TRANSESOPHAGEAL ECHOCARDIOGRAM (TEE) (N/A) as a surgical intervention.  The patient's history has been reviewed, patient examined, no change in status, stable for surgery.  I have reviewed the patient's chart and labs.  Questions were answered to the patient's satisfaction.     Gaye Pollack

## 2020-01-29 NOTE — Progress Notes (Signed)
CT surgery p.m. Rounds  Patient comfortable now after left minithoracotomy for apical TAVR. No air leak and minimal drainage from left chest tube Intrinsically AV paced Pulmonary status stable neuro intact

## 2020-01-29 NOTE — Op Note (Signed)
[facility]   MULTIDISCIPLINARY HEART VALVE TEAM   TAVR OPERATIVE NOTE   Date of Procedure:  01/29/2020  Preoperative Diagnosis: Severe Aortic Stenosis   Postoperative Diagnosis: Same   Procedure:    Transcatheter Aortic Valve Replacement - Transapical Approach  Edwards Sapien 3 THV (size 23 mm, model # 9600TFX, serial # [id number])   Co-Surgeons:  Gaye Pollack, MD and Sherren Mocha, MD  Anesthesiologist:  Midge Minium, MD  Echocardiographer:  Sanda Klein, MD  Pre-operative Echo Findings:  Severe aortic stenosis  Normal left ventricular systolic function  Post-operative Echo Findings:  Trivial paravalvular leak  Normal/unchanged left ventricular systolic function  BRIEF CLINICAL NOTE AND INDICATIONS FOR SURGERY  74 yo woman with a complex history presents for transapical TAVR for treatment of severe, symptomatic aortic stenosis. She presented initially with a Type B aortic dissection in 2003 and this was managed conservatively. Her second dissection (Type A) required emergency surgical intervention by Dr Servando Snare in 2008. The aorta was replaced with a 28 mm graft from the level of the sinuses to the innominate artery and the aortic valve was resuspended. Over time she has developed progressive and now severe stage D2 aortic stenosis with NYHA functional class 2 symptoms of acute on chronic diastolic heart failure (pre-op BNP 765). After extensive multidisciplinary review, we have elected to treat her with TAVR from a transapical approach considering her extensive chronic Type B dissection.  During the course of the patient's preoperative work up they have been evaluated comprehensively by a multidisciplinary team of specialists coordinated through the [facility] in the [facility].  They have been demonstrated to suffer from symptomatic severe aortic stenosis as noted above. The patient has been  counseled extensively as to the relative risks and benefits of all options for the treatment of severe aortic stenosis including long term medical therapy, conventional surgery for aortic valve replacement, and transcatheter aortic valve replacement.  The patient has been independently evaluated in formal cardiac surgical consultation by Dr Cyndia Bent, who deemed the patient appropriate for TAVR. Based upon review of all of the patient's preoperative diagnostic tests they are felt to be candidate for transcatheter aortic valve replacement using the transapical approach as an alternative to conventional surgery.    Following the decision to proceed with transcatheter aortic valve replacement, a discussion has been held regarding what types of management strategies would be attempted intraoperatively in the event of life-threatening complications, including whether or not the patient would be considered a candidate for the use of cardiopulmonary bypass and/or conversion to open sternotomy for attempted surgical intervention.  The patient has been advised of a variety of complications that might develop peculiar to this approach including but not limited to risks of death, stroke, paravalvular leak, aortic dissection or other major vascular complications, aortic annulus rupture, device embolization, cardiac rupture or perforation, acute myocardial infarction, arrhythmia, heart block or bradycardia requiring permanent pacemaker placement, congestive heart failure, respiratory failure, renal failure, pneumonia, infection, other late complications related to structural valve deterioration or migration, or other complications that might ultimately cause a temporary or permanent loss of functional independence or other long term morbidity.  The patient provides full informed consent for the procedure as described and all questions were answered preoperatively.  DETAILS OF THE OPERATIVE PROCEDURE  PREPARATION:   The  patient is brought to the operating room on the above mentioned date and central monitoring was established by the anesthesia team including  placement of a central venous catheter and radial arterial line. The patient is placed in the supine position on the operating table.  Intravenous antibiotics are administered. General endotracheal anesthesia is induced uneventfully.  A Foley catheter is placed.  Baseline transesophageal echocardiogram is performed. The patient's chest, abdomen, both groins, and both lower extremities are prepared and draped in a sterile manner. A time out procedure is performed.   PERIPHERAL ACCESS:   Using ultrasound guidance, femoral arterial and venous access is obtained with placement of 6 Fr sheaths on the left side.  A pigtail diagnostic catheter was passed through the femoral arterial sheath under fluoroscopic guidance into the aortic root.  A temporary transvenous pacemaker catheter was passed through the femoral venous sheath under fluoroscopic guidance into the right ventricle.  The pacemaker was tested to ensure stable lead placement and pacemaker capture. Aortic root angiography was performed in order to determine the optimal angiographic angle for valve deployment.  TRANSAPICAL ACCESS:  Please see the complete note of Dr Cyndia Bent for details of apical exposure. Using an 18 gauge needle, the apex is accessed and a J wire is advanced into the aortic arch. A 6 Fr sheath is inserted into the LV apex over the wire and a crossover catheter is used to change out to an Extra Stiff 0.035" wire in the descending thoracic aorta. An Edwards Certitude sheath is inserted over the extra stiff wire using fluoroscopic guidance.  BALLOON AORTIC VALVULOPLASTY:  Not performed  TRANSCATHETER HEART VALVE DEPLOYMENT:  An Edwards Sapien 3 transcatheter heart valve (size 23 mm) was prepared and crimped per manufacturer's guidelines, and the proper orientation of the valve is confirmed on the  [place] Certitude delivery system. The valve was advanced through the introducer sheath using normal technique after de-airing the sheath.  The valve is advanced into the aortic annulus using the pigtail catheter for a landmark. Once final position of the valve has been confirmed by angiographic assessment, the valve is deployed while temporarily holding ventilation and during rapid ventricular pacing to maintain systolic blood pressure < 50 mmHg and pulse pressure < 10 mmHg. The balloon inflation is held for >3 seconds after reaching full deployment volume. Once the balloon has fully deflated the balloon is retracted into the ascending aorta and valve function is assessed using echocardiography. The patient's hemodynamic recovery following valve deployment is good.  There is 1-2+ paravalvular AI after valve deployment. 1cc of additional contrast mix is introduced into the Atrion device and the valve is post-dilated using the Edwards balloon under rapid pacing. Repeat TEE evaluation shows only trivial residual paravalvular leak.   PROCEDURE COMPLETION:  The temporary pacemaker and pigtail catheters are removed. Mynx closure  is used for contralateral femoral arterial hemostasis for the 6 Fr sheath. See Dr Vivi Martens note for transapical closure details.   The patient tolerated the procedure well and is transported to the surgical intensive care in stable condition. There were no immediate intraoperative complications. All sponge instrument and needle counts are verified correct at completion of the operation.   The patient received a total of 60 mL of intravenous contrast during the procedure.   Sherren Mocha, MD 01/29/2020 10:21 AM

## 2020-01-29 NOTE — Progress Notes (Signed)
  [place] VALVE TEAM  Patient doing well s/p TAVR. She is hemodynamically stable, but BP is elevated. Patient complains of 12/10 back pain in the middle of her back with no radiation. BP 166/52- likely more elevated given acute pain. Repeat ECG with no acute ST changes. Given 4 mg of IV morphine and BP improved. Will order IV hydralazine as needed for SBP >140. Groin sites stable. Plan for transfer to ICU when bed available. Plan for early ambulation after bedrest completed and hopeful discharge over the next 24-48 hours.   Angelena Form PA-C  [facility]  Pager [phone]

## 2020-01-29 NOTE — Progress Notes (Signed)
Patient ID: [name], female   DOB: 1946/08/02, 74 y.o.   MRN: [id number] TCTS:   Hemodynamically stable in sinus rhythm.  Pain much improved after morphine.  CXR looks fine.  Continue to observe.

## 2020-01-29 NOTE — Progress Notes (Signed)
Patient ID: [name], female   DOB: Sep 09, 1946, 74 y.o.   MRN: [id number] TCTS:   Hemodynamically stable after transapical TAVR. She complains of severe mid to left back pain. ECG ok. CXR still not done yet postop.  Chest tube output minimal  Dressing dry  Groin sites ok.  Etiology of pain unknown. May be related to thoracotomy incison with traction on ribs. She does have a hx of aortic dissection x 2. I don't see any need to do CT since it would not changed management. Control BP and treat pain symptomatically.

## 2020-01-29 NOTE — Discharge Instructions (Signed)
ACTIVITY AND EXERCISE °• Daily activity and exercise are an important part of your recovery. People recover at different rates depending on their general health and type of valve procedure. °• Most people recovering from TAVR feel better relatively quickly  °• No lifting, pushing, pulling more than 10 pounds (examples to avoid: groceries, vacuuming, gardening, golfing): °            - For one week with a procedure through the groin. °            - For six weeks for procedures through the chest wall or neck. °NOTE: You will typically see one of our providers 7-14 days after your procedure to discuss WHEN TO RESUME the above activities.  °  °  °DRIVING °• Do not drive until you are seen for follow up and cleared by a provider. Generally, we ask patient to not drive for 1 week after their procedure. °• If you have been told by your doctor in the past that you may not drive, you must talk with him/her before you begin driving again. °  °DRESSING °• Groin site: you may leave the clear dressing over the site for up to one week or until it falls off. °  °HYGIENE °• If you had a femoral (leg) procedure, you may take a shower when you return home. After the shower, pat the site dry. Do NOT use powder, oils or lotions in your groin area until the site has completely healed. °• If you had a chest procedure, you may shower when you return home unless specifically instructed not to by your discharging practitioner. °            - DO NOT scrub incision; pat dry with a towel. °            - DO NOT apply any lotions, oils, powders to the incision. °            - No tub baths / swimming for at least 2 weeks. °• If you notice any fevers, chills, increased pain, swelling, bleeding or pus, please contact your doctor. °  °ADDITIONAL INFORMATION °• If you are going to have an upcoming dental procedure, please contact our office as you will require antibiotics ahead of time to prevent infection on your heart valve.  ° ° °If you have any  questions or concerns you can call the structural heart phone during normal business hours 8am-4pm. If you have an urgent need after hours or weekends please call [phone] to talk to the on call provider for general cardiology. If you have an emergency that requires immediate attention, please call 911.  ° ° °After TAVR Checklist ° °Check  Test Description  ° Follow up appointment in 1-2 weeks  You will see our structural heart physician assistant, Katie Dicicco. Your incision sites will be checked and you will be cleared to drive and resume all normal activities if you are doing well.    ° 1 month echo and follow up  You will have an echo to check on your new heart valve and be seen back in the office by Katie Cheramie. Many times the echo is not read by your appointment time, but Katie will call you later that day or the following day to report your results.  ° Follow up with your primary cardiologist You will need to be seen by your primary cardiologist in the following 3-6 months after your 1 month appointment in the valve   clinic. Often times your Plavix or Aspirin will be discontinued during this time, but this is decided on a case by case basis.   ° 1 year echo and follow up You will have another echo to check on your heart valve after 1 year and be seen back in the office by Katie Baskette. This your last structural heart visit.  ° Bacterial endocarditis prophylaxis  You will have to take antibiotics for the rest of your life before all dental procedures (even teeth cleanings) to protect your heart valve. Antibiotics are also required before some surgeries. Please check with your cardiologist before scheduling any surgeries. Also, please make sure to tell us if you have a penicillin allergy as you will require an alternative antibiotic.   ° ° °

## 2020-01-29 NOTE — Anesthesia Postprocedure Evaluation (Signed)
Anesthesia Post Note  Patient: [name]  Procedure(s) Performed: TRANSCATHETER AORTIC VALVE REPLACEMENT, TRANSAPICAL using a Edwards Sapien 3, 22mm  Transcatheter Heart Valve (N/A ) TRANSESOPHAGEAL ECHOCARDIOGRAM (TEE) (N/A )     Patient location during evaluation: Cath Lab Anesthesia Type: General Level of consciousness: awake and alert, patient cooperative and oriented Pain control: pain improving. Vital Signs Assessment: post-procedure vital signs reviewed and stable Respiratory status: spontaneous breathing, nonlabored ventilation and respiratory function stable Cardiovascular status: blood pressure returned to baseline and stable Postop Assessment: no apparent nausea or vomiting Anesthetic complications: no    Last Vitals:  Vitals:   01/29/20 1430 01/29/20 1600  BP: (!) 108/45 (!) 107/51  Pulse: (!) 59 (!) 59  Resp: 18 18  Temp:    SpO2: 95% 95%    Last Pain:  Vitals:   01/29/20 1328  TempSrc:   PainSc: 8                  Channin Agustin,E. Ramonda Galyon

## 2020-01-29 NOTE — Op Note (Signed)
CARDIOTHORACIC SURGERY OPERATIVE NOTE  Date of Procedure:  01/29/2020  Preoperative Diagnosis: Severe Aortic Stenosis   Postoperative Diagnosis: Same   Procedure:    Transcatheter Aortic Valve Replacement - Transapical Approach  Edwards Sapien 3 THV (size 23 mm, model # 9600TFX, serial # [id number])   Co-Surgeons:  Gaye Pollack, MD and Sherren Mocha, MD   Anesthesiologist:  Annye Asa, MD  Echocardiographer:  Jerilynn Mages. Croitoru, MD  Pre-operative Echo Findings:   severe aortic stenosis  Normal left ventricular systolic function   Post-operative Echo Findings:  Trivial paravalvular leak  Normal left ventricular systolic function   BRIEF CLINICAL NOTE AND INDICATIONS FOR SURGERY  This 74 year old woman has stage D, low flow, low gradient, severe aortic stenosis with New York Heart Association class II symptoms of exertional fatigue and shortness of breath with a reduced ejection fraction consistent with chronic combined systolic and diastolic congestive heart failure. I have personally reviewed her 2D echocardiogram, cardiac catheterization, and CTA studies. Her echocardiogram shows a severely calcified aortic valve with restricted leaflet mobility. It is not clear how many leaflet she has. The mean gradient across the aortic valve was 27 mmHg with a peak gradient of 48 mmHg. Dimensionless index was 0.2 consistent with severe aortic stenosis. Left ventricular ejection fraction was reduced to 45%. Cardiac catheterization shows mild nonobstructive coronary disease. The mean gradient across aortic valve was measured at 26 mmHg with a calculated aortic valve area of 0.8 cm. I agree that aortic valve replacement is indicated in this patient for relief of her progressive symptoms and to prevent progressive left ventricular deterioration. She would be at very high risk for open surgical aortic valve replacement and I think transcatheter aortic valve replacement would be the best option for  her. Her gated cardiac CTA shows a trileaflet aortic valve with anatomy suitable for transcatheter aortic valve replacement using a SAPIEN 3 valve. The CTA of the chest, abdomen, and pelvis shows a chronic type A and B dissection with persistent false lumen involving the aortic arch beyond the suture line just proximal to the innominate artery and extending throughout the entire aortic arch and descending thoracic aorta. I don't think she would be a candidate for transfemoral insertion and the carotid and subclavian arteries are also not good options since the dissection involves the entire aortic arch with a patent false lumen. I think the best option for her is transapical insertion.   The patient and her son were counseled at length regarding treatment alternatives for management of severe symptomatic aortic stenosis. The risks and benefits of surgical intervention has been discussed in detail. Long-term prognosis with medical therapy was discussed. Alternative approaches such as conventional surgical aortic valve replacement, transcatheter aortic valve replacement, and palliative medical therapy were compared and contrasted at length. This discussion was placed in the context of the patient's own specific clinical presentation and past medical history. All of their questions been addressed.  Following the decision to proceed with transcatheter aortic valve replacement, a discussion was held regarding what types of management strategies would be attempted intraoperatively in the event of life-threatening complications, including whether or not the patient would be considered a candidate for the use of cardiopulmonary bypass and/or conversion to open sternotomy for attempted surgical intervention. Given her prior aortic dissection repair and chronic dissection involving the aortic arch and descending thoracic aorta I don't think she would be a candidate for emergent sternotomy to manage any intraoperative  complications.   The patient has been advised of  a variety of complications that might develop including but not limited to risks of death, stroke, paravalvular leak, aortic dissection or other major vascular complications, aortic annulus rupture, device embolization, cardiac rupture or perforation, mitral regurgitation, acute myocardial infarction, arrhythmia, heart block or bradycardia requiring permanent pacemaker placement, congestive heart failure, respiratory failure, renal failure, pneumonia, infection, other late complications related to structural valve deterioration or migration, or other complications that might ultimately cause a temporary or permanent loss of functional independence or other long term morbidity. The patient provides full informed consent for the procedure as described and all questions were answered.   DETAILS OF THE OPERATIVE PROCEDURE  PREPARATION:    The patient is brought to the operating room on the above mentioned date and central monitoring was established by the anesthesia team including placement of Swan-Ganz catheter and radial arterial line. The patient is placed in the supine position on the operating table.  Intravenous antibiotics are administered. General endotracheal anesthesia is induced uneventfully. A Foley catheter is placed.  Baseline transesophageal echocardiogram was performed. The patient's chest, abdomen, both groins, and both lower extremities are prepared and draped in a sterile manner. A time out procedure is performed.   PERIPHERAL ACCESS:    Femoral arterial and venous access is obtained with placement of 6 Fr arterial and venous sheaths on the left side.  A pigtail diagnostic catheter is passed through the left arterial sheath under fluoroscopic guidance into the aortic root.  A temporary transvenous pacemaker catheter is passed through the left femoral venous sheath under fluoroscopic guidance into the right ventricle.  The pacemaker is  tested to ensure stable lead placement and pacemaker capture.   TRANSAPICAL ACCESS:   The location of the left ventricular apex is confirmed using flouroscopy, and a miniature left thoracotomy incision is made directly over the left ventricular apex.  The left pleural space is entered.  No adhesions are encountered.  A soft tissue retractor is placed and the ribs gently spread to expose the pericardial surface.  A longitudinal incision is made in the pericardium and the left ventricular apex inspected.  There are no intrapericardial adhesions.  An appropriate site for left ventricular sheath placement is chosen well lateral to the left anterior descending coronary artery and verified using TEE.  Two pursestring sutures are placed using pledgeted 2-0 Ethibond suture.   The patient is heparinized systemically and ACT verified > 250 seconds.  The left ventricular apex is punctured using an 18 gauge needle and a soft J-tipped guidewire is passed into the left ventricle and through the aortic valve under fluoroscopic guidance while also continuously monitoring TEE for signs of entrapment in the mitral apparatus.  A 6 Fr sheath is placed over the guidewire and across the aortic valve.  A crossover catheter is passed through the sheath and maneuvered around the aortic arch into the descending thoracic aorta under fluoroscopic guidance.  An Amplatz extra stiff guidewire is passed through the crossover catheter into the descending thoracic aorta and both the crossover catheter and the introducing sheath are removed.  An Edwards Certitude sheath is passed over the guidewire into the left ventricle.  The sheath position is continuously monitored and secured by the surgical assistant.   BALLOON AORTIC VALVULOPLASTY:  Not performed  TRANSCATHETER HEART VALVE DEPLOYMENT:  An Edwards Sapien 3 transcatheter heart valve ( size 23 mm)  is prepared and crimped per manufacturer's guidelines, and the proper orientation of  the valve is confirmed on the [place] Certitude delivery system.  The delivery system loader is advanced into the introducing sheath.  The valve and delivery system are advanced through the loader into the sheath and all air is evacuated.  The valve and balloon are advanced into the left ventricle and part way through the aortic valve.  The valve is then finely positioned in the aortic valve. Once final position of the valve has been confirmed by angiographic assessment, the valve is deployed while temporarily holding ventilation and during rapid ventricular pacing to maintain systolic blood pressure < 50 mmHg and pulse pressure < 10 mmHg.  The balloon inflation is held for >3 seconds after reaching full deployment volume.  Once the balloon has fully deflated the balloon is retracted into the left ventricle and valve function is assessed using TEE.   There is felt to be 1-2+ paravalvular leak and no central aortic insufficiency.  Left ventricular function is unchanged from preoperatively.  The patient's hemodynamic recovery following valve deployment is good.  We decided to add 1 cc volume and do a post-dilation. 1cc of additional contrast mix is introduced into the Atrion device and the valve is post-dilated using the Edwards balloon under rapid pacing. Repeat TEE evaluation shows only trivial residual paravalvular leak.   PROCEDURE COMPLETION:  The left ventricular sheath is removed during rapid ventricular pacing to maintain systolic blood pressure < 70 mmHg while the pursestring sutures are tied.  The apical closure is inspected and notably hemostatic.  Protamine is administered.    Once hemostasis has been ascertained, the left pleural space is drained using a single 28 Fr Bard chest tube and the mini thoracotomy incision is closed in layers and the skin incision closed using a subcuticular skin closure.   The temporary pacemaker, pigtail catheters and all femoral sheaths are removed. Mynx closure is used  for the femoral arterial sheath.  The patient tolerated the procedure well and is transported to the surgical intensive care in stable condition. There are no intraoperative complications. All sponge instrument and needle counts are verified correct at completion of the operation.  She was given two units of PRBC's since her preop Hgb was 8.9 and dropped with hemodilution. EBL was 50 cc.  The patient received a total of 60 mL of intravenous contrast during the procedure.    Gaye Pollack MD 01/29/2020

## 2020-01-30 ENCOUNTER — Inpatient Hospital Stay (HOSPITAL_COMMUNITY): Payer: Medicare HMO

## 2020-01-30 ENCOUNTER — Encounter: Payer: Self-pay | Admitting: *Deleted

## 2020-01-30 ENCOUNTER — Other Ambulatory Visit: Payer: Self-pay | Admitting: Family Medicine

## 2020-01-30 DIAGNOSIS — F419 Anxiety disorder, unspecified: Secondary | ICD-10-CM

## 2020-01-30 DIAGNOSIS — Z954 Presence of other heart-valve replacement: Secondary | ICD-10-CM

## 2020-01-30 DIAGNOSIS — I350 Nonrheumatic aortic (valve) stenosis: Secondary | ICD-10-CM

## 2020-01-30 DIAGNOSIS — Z952 Presence of prosthetic heart valve: Secondary | ICD-10-CM

## 2020-01-30 LAB — ECHOCARDIOGRAM COMPLETE
Height: 60 in
Weight: 2709.01 oz

## 2020-01-30 LAB — POCT I-STAT 7, (LYTES, BLD GAS, ICA,H+H)
Acid-base deficit: 3 mmol/L — ABNORMAL HIGH (ref 0.0–2.0)
Acid-base deficit: 7 mmol/L — ABNORMAL HIGH (ref 0.0–2.0)
Bicarbonate: 22.9 mmol/L (ref 20.0–28.0)
Bicarbonate: 18.6 mmol/L — ABNORMAL LOW (ref 20.0–28.0)
Calcium, Ion: 1.26 mmol/L (ref 1.15–1.40)
Calcium, Ion: 1.21 mmol/L (ref 1.15–1.40)
HCT: 27 % — ABNORMAL LOW (ref 36.0–46.0)
HCT: 25 % — ABNORMAL LOW (ref 36.0–46.0)
Hemoglobin: 9.2 g/dL — ABNORMAL LOW (ref 12.0–15.0)
Hemoglobin: 8.5 g/dL — ABNORMAL LOW (ref 12.0–15.0)
O2 Saturation: 100 %
O2 Saturation: 98 %
Potassium: 4.5 mmol/L (ref 3.5–5.1)
Potassium: 4.2 mmol/L (ref 3.5–5.1)
Sodium: 138 mmol/L (ref 135–145)
Sodium: 137 mmol/L (ref 135–145)
TCO2: 24 mmol/L (ref 22–32)
TCO2: 20 mmol/L — ABNORMAL LOW (ref 22–32)
pCO2 arterial: 42 mmHg (ref 32.0–48.0)
pCO2 arterial: 36.7 mmHg (ref 32.0–48.0)
pH, Arterial: 7.344 — ABNORMAL LOW (ref 7.350–7.450)
pH, Arterial: 7.313 — ABNORMAL LOW (ref 7.350–7.450)
pO2, Arterial: 430 mmHg — ABNORMAL HIGH (ref 83.0–108.0)
pO2, Arterial: 110 mmHg — ABNORMAL HIGH (ref 83.0–108.0)

## 2020-01-30 LAB — BASIC METABOLIC PANEL
Anion gap: 10 (ref 5–15)
BUN: 46 mg/dL — ABNORMAL HIGH (ref 8–23)
CO2: 17 mmol/L — ABNORMAL LOW (ref 22–32)
Calcium: 8.5 mg/dL — ABNORMAL LOW (ref 8.9–10.3)
Chloride: 105 mmol/L (ref 98–111)
Creatinine, Ser: 1.44 mg/dL — ABNORMAL HIGH (ref 0.44–1.00)
GFR calc Af Amer: 42 mL/min — ABNORMAL LOW (ref >60)
GFR calc non Af Amer: 36 mL/min — ABNORMAL LOW (ref >60)
Glucose, Bld: 119 mg/dL — ABNORMAL HIGH (ref 70–99)
Potassium: 5.1 mmol/L (ref 3.5–5.1)
Sodium: 132 mmol/L — ABNORMAL LOW (ref 135–145)

## 2020-01-30 LAB — GLUCOSE, CAPILLARY
Glucose-Capillary: 97 mg/dL (ref 70–99)
Glucose-Capillary: 102 mg/dL — ABNORMAL HIGH (ref 70–99)
Glucose-Capillary: 132 mg/dL — ABNORMAL HIGH (ref 70–99)
Glucose-Capillary: 98 mg/dL (ref 70–99)

## 2020-01-30 LAB — POCT I-STAT, CHEM 8
BUN: 49 mg/dL — ABNORMAL HIGH (ref 8–23)
BUN: 37 mg/dL — ABNORMAL HIGH (ref 8–23)
BUN: 39 mg/dL — ABNORMAL HIGH (ref 8–23)
Calcium, Ion: 1.27 mmol/L (ref 1.15–1.40)
Calcium, Ion: 1.23 mmol/L (ref 1.15–1.40)
Calcium, Ion: 1.26 mmol/L (ref 1.15–1.40)
Chloride: 107 mmol/L (ref 98–111)
Chloride: 107 mmol/L (ref 98–111)
Chloride: 107 mmol/L (ref 98–111)
Creatinine, Ser: 1.2 mg/dL — ABNORMAL HIGH (ref 0.44–1.00)
Creatinine, Ser: 1.2 mg/dL — ABNORMAL HIGH (ref 0.44–1.00)
Creatinine, Ser: 1.2 mg/dL — ABNORMAL HIGH (ref 0.44–1.00)
Glucose, Bld: 93 mg/dL (ref 70–99)
Glucose, Bld: 104 mg/dL — ABNORMAL HIGH (ref 70–99)
Glucose, Bld: 112 mg/dL — ABNORMAL HIGH (ref 70–99)
HCT: 32 % — ABNORMAL LOW (ref 36.0–46.0)
HCT: 19 % — ABNORMAL LOW (ref 36.0–46.0)
HCT: 21 % — ABNORMAL LOW (ref 36.0–46.0)
Hemoglobin: 10.9 g/dL — ABNORMAL LOW (ref 12.0–15.0)
Hemoglobin: 6.5 g/dL — CL (ref 12.0–15.0)
Hemoglobin: 7.1 g/dL — ABNORMAL LOW (ref 12.0–15.0)
Potassium: 4.4 mmol/L (ref 3.5–5.1)
Potassium: 4.2 mmol/L (ref 3.5–5.1)
Potassium: 4.3 mmol/L (ref 3.5–5.1)
Sodium: 137 mmol/L (ref 135–145)
Sodium: 136 mmol/L (ref 135–145)
Sodium: 137 mmol/L (ref 135–145)
TCO2: 26 mmol/L (ref 22–32)
TCO2: 19 mmol/L — ABNORMAL LOW (ref 22–32)
TCO2: 20 mmol/L — ABNORMAL LOW (ref 22–32)

## 2020-01-30 LAB — CBC
HCT: 33.2 % — ABNORMAL LOW (ref 36.0–46.0)
Hemoglobin: 10.6 g/dL — ABNORMAL LOW (ref 12.0–15.0)
MCH: 30.5 pg (ref 26.0–34.0)
MCHC: 31.9 g/dL (ref 30.0–36.0)
MCV: 95.4 fL (ref 80.0–100.0)
Platelets: 102 10*3/uL — ABNORMAL LOW (ref 150–400)
RBC: 3.48 MIL/uL — ABNORMAL LOW (ref 3.87–5.11)
RDW: 16.5 % — ABNORMAL HIGH (ref 11.5–15.5)
WBC: 4.9 10*3/uL (ref 4.0–10.5)
nRBC: 0 % (ref 0.0–0.2)

## 2020-01-30 LAB — MAGNESIUM: Magnesium: 1.7 mg/dL (ref 1.7–2.4)

## 2020-01-30 MED ORDER — TRAMADOL HCL 50 MG PO TABS: 50.0000 mg | ORAL_TABLET | Freq: Two times a day (BID) | ORAL | Status: DC | PRN

## 2020-01-30 MED ORDER — ALPRAZOLAM 0.5 MG PO TABS: 0.5000 mg | ORAL_TABLET | Freq: Three times a day (TID) | ORAL | 0 refills | Status: DC | PRN

## 2020-01-30 MED ORDER — SODIUM CHLORIDE 0.9% FLUSH
10.0000 mL | Freq: Two times a day (BID) | INTRAVENOUS | Status: DC
  Administered 2020-01-30: 10 mL

## 2020-01-30 MED ORDER — SODIUM CHLORIDE 0.9% FLUSH: 10.0000 mL | INTRAVENOUS | Status: DC | PRN

## 2020-01-30 MED ORDER — CITALOPRAM HYDROBROMIDE 20 MG PO TABS
20.0000 mg | ORAL_TABLET | Freq: Every day | ORAL | Status: DC
  Administered 2020-01-30 – 2020-02-01 (×3): 20 mg via ORAL
  Filled 2020-01-30 (×3): qty 1

## 2020-01-30 MED FILL — Heparin Sod (Porcine)-NaCl IV Soln 1000 Unit/500ML-0.9%: INTRAVENOUS | Qty: 500 | Status: AC

## 2020-01-30 MED FILL — Morphine Sulfate Inj 4 MG/ML: INTRAMUSCULAR | Qty: 1 | Status: AC

## 2020-01-30 MED FILL — Lidocaine HCl Local Preservative Free (PF) Inj 1%: INTRAMUSCULAR | Qty: 30 | Status: AC

## 2020-01-30 NOTE — Progress Notes (Signed)
  Echocardiogram 2D Echocardiogram has been performed.  [name] 01/30/2020, 8:52 AM

## 2020-01-30 NOTE — Progress Notes (Signed)
1 Day Post-Op Procedure(s) (LRB): TRANSCATHETER AORTIC VALVE REPLACEMENT, TRANSAPICAL using a Edwards Sapien 3, 89mm  Transcatheter Heart Valve (N/A) TRANSESOPHAGEAL ECHOCARDIOGRAM (TEE) (N/A) Subjective: Feels better up in chair. Feels stronger today and back pain better. Eating breakfast.  Objective: Vital signs in last 24 hours: Temp:  [97.7 F (36.5 C)-98.2 F (36.8 C)] 98.2 F (36.8 C) (03/03 0400) Pulse Rate:  [58-129] 59 (03/03 0700) Cardiac Rhythm: A-V Sequential paced (03/03 0400) Resp:  [11-23] 12 (03/03 0700) BP: (91-161)/(41-75) 104/75 (03/03 0700) SpO2:  [91 %-96 %] 96 % (03/03 0700) Weight:  [76.8 kg] 76.8 kg (03/03 0630)  Hemodynamic parameters for last 24 hours:    Intake/Output from previous day: 03/02 0701 - 03/03 0700 In: 3174.1 [P.O.:280; I.V.:1721.9; Blood:630; IV Piggyback:542.3] Out: 1210 [Urine:1000; Blood:50; Chest Tube:160] Intake/Output this shift: No intake/output data recorded.  General appearance: alert and cooperative Neurologic: intact Heart: regular rate and rhythm, S1, S2 normal, no murmur Lungs: clear to auscultation bilaterally Extremities: extremities normal, groin site ok, no edema Wound: dressing dry  Lab Results: Recent Labs    01/29/20 1144 01/30/20 0343  WBC  --  4.9  HGB 10.2* 10.6*  HCT 30.0* 33.2*  PLT  --  102*   BMET:  Recent Labs    01/29/20 1144 01/30/20 0343  NA 137 132*  K 4.8 5.1  CL 108 105  CO2  --  17*  GLUCOSE 146* 119*  BUN 39* 46*  CREATININE 1.20* 1.44*  CALCIUM  --  8.5*    PT/INR: No results for input(s): LABPROT, INR in the last 72 hours. ABG    Component Value Date/Time   PHART 7.388 01/25/2020 1011   HCO3 21.6 01/25/2020 1011   TCO2 21 (L) 01/29/2020 1144   ACIDBASEDEF 2.6 (H) 01/25/2020 1011   O2SAT 98.7 01/25/2020 1011   CBG (last 3)  Recent Labs    01/29/20 1719 01/29/20 2119 01/30/20 0614  GLUCAP 158* 155* 97   CXR: poor quality rotated film but looks ok with no pleural  effusion or ptx.  Assessment/Plan: S/P Procedure(s) (LRB): TRANSCATHETER AORTIC VALVE REPLACEMENT, TRANSAPICAL using a Edwards Sapien 3, 10mm  Transcatheter Heart Valve (N/A) TRANSESOPHAGEAL ECHOCARDIOGRAM (TEE) (N/A)  POD 1 Hemodynamically stable in paced rhythm. She has pacer/ICD.  Hold Coreg and Cozaar for now since BP low normal.  DC chest tube, central line and foley.  Follow up 2V CXR in am.  2D echo today.  DM: glucose under adequate control. Continue SSI today. Resume oral med at discharge.  ASA/Plavix.  Transfer to 4E and mobilize.  Plan home tomorrow if no changes.   LOS: 1 day    Gaye Pollack 01/30/2020

## 2020-01-30 NOTE — Progress Notes (Signed)
      [address]       [place] [address]             [phone]      Awaiting room on 4E  Eating dinner currently  BP (!) 124/52   Pulse 61   Temp 98.5 F (36.9 C) (Oral)   Resp (!) 26   Ht 5' (1.524 m)   Wt 76.8 kg   SpO2 (!) 84%   BMI 33.07 kg/m   Continue current care  Vayden Weinand C. Roxan Hockey, MD [facility] [phone]

## 2020-01-30 NOTE — Progress Notes (Signed)
Patient arrived to 4E room 01 at this time. V/s done. CHG bath complete. Telemetry applied and CCMD notified. Patient oriented to room and how to call nurse with any needs.

## 2020-01-30 NOTE — Progress Notes (Signed)
CARDIAC REHAB PHASE I   PRE:  Rate/Rhythm: 60 AV paced   BP:  Supine: 100/556  Sitting: 106/47  Standing:    SaO2: 95% 1.5L    91-91%RA  MODE:  Ambulation: 20 ft Outside of room      POST:  Rate/Rhythm: 79 paced  BP:  Supine:   Sitting: 113/61  Standing:    SaO2: 87% RA   1.5 L 95% 1318-1411 Pt anxious re walking as she feels very weak. Assisted to standing and leaning backward to begin with. C/o feeling lightheaded and unsteady. Used gait belt to assist pt and she used walker. Had pt's RN follow with recliner for safety. Pt took very small steps and slowly. Asked several times if I had her steadied. Pt has rollator at home. She stated she lives alone. Would recommend PT consult as pt is very weak. Had to put back on oxygen for sats to return to above 90%. Pt stated she felt less lightheaded as she was up. To recliner and encouraged her to walk with staff later.  She stated has not used IS today. Encouraged her to do 10x an hour to help get off oxygen.  Walked 20 ft outside of room.   Graylon Good, RN BSN  01/30/2020 2:09 PM

## 2020-01-30 NOTE — Progress Notes (Signed)
Spoke with Technical sales engineer and she is aware of the DC central line order for this patient

## 2020-01-30 NOTE — Discharge Summary (Addendum)
[name]  Discharge Summary    Patient ID: [name] MRN: [id number]; DOB: 06-Sep-1946  Admit date: 01/29/2020 Discharge date: 02/01/2020  Primary Care Provider: [name], DO  Primary Cardiologist: Anna Klein, MD / Dr. Burt [name] & Dr. 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 Oxycodone Itching  . Vicodin [Hydrocodone-Acetaminophen] Itching  . Latex Itching and Rash  . Neomycin-Bacitracin Zn-Polymyx Other (See Comments)    Red , swollen eye  . Sulfonamide Derivatives Other (See Comments)    Just had reaction when taking during pregnancy- last child birth [facility] NOTE  Date of Procedure:                01/29/2020  Preoperative Diagnosis:      Severe Aortic Stenosis   Postoperative Diagnosis:    Same   Procedure:        Transcatheter Aortic Valve Replacement - Transapical Approach             Edwards Sapien 3 THV (size 23 mm, model # 9600TFX, serial # [id number])              Co-Surgeons:                        Anna Pollack,  MD and Anna Mocha, MD   Anesthesiologist:                  Anna Asa, MD  Echocardiographer:              Anna Sol, MD  Pre-operative Echo Findings: ?  severe aortic stenosis ? Normal left ventricular systolic function   Post-operative Echo Findings: ? Trivial paravalvular leak ? Normal left ventricular systolic function  _____________   Echo 01/30/20 IMPRESSIONS 1. Left ventricular ejection fraction, by estimation, is 45 to 50%. The  left ventricle has mildly decreased function. The left ventricle has no  regional wall motion abnormalities. There is mild concentric left  ventricular hypertrophy. Left ventricular diastolic parameters are consistent with Grade II diastolic dysfunction (pseudonormalization). Elevated left atrial pressure.  2. Right ventricular systolic function is mildly reduced. The right  ventricular size is normal. There is moderately elevated pulmonary artery systolic pressure. The estimated right ventricular systolic pressure is  0000000 mmHg.  3. Left atrial size was moderately dilated.  4. Right atrial size was moderately dilated.  5. The mitral valve is normal in structure and function. Mild mitral  valve regurgitation.  6. Tricuspid valve regurgitation is mild to moderate.  7. The aortic valve has been repaired/replaced.  Aortic valve  regurgitation is not visualized. There is a 23 mm Edwards Sapien  prosthetic (TAVR) valve present in the aortic position. Procedure Date: 01/29/2020. Echo findings are consistent with normal  structure and function of the aortic valve prosthesis. Aortic valve area,  by VTI measures 2.07 cm. Aortic valve mean gradient measures 9.5 mmHg. Aortic valve Vmax measures 2.10 m/s. Dimensionless index 0.66.  8. Aortic root/ascending aorta has been repaired/replaced.  9. The inferior vena cava is dilated in size with >50% respiratory  variability, suggesting right atrial pressure of 8 mmHg.    History of  Present Illness     [name] is a 74 y.o. female with a history of type B aortic dissection in 2003--> managed conservatively, Type A dissection s/p emergency surgical intervention in 2008 by EBG (aorta was replaced with a 28 mm graft from the level of the sinuses to the innominate artery and the aortic valve was resuspended), sinus node dysfunction s/p PPM, chronic combined S/D CHF, device re programing--> syncope in7/2020 2/2 to sustainedmonomorphic VT s/p ICD, CKD stage III, DMT2, anemia, HTN, HLD, GERD, obesity with recent intentional 60 lb weight loss and LFLG severe AS who presented to [name] on 01/29/20 for planned TAVR.  She has a history of progressive exertional fatigue and shortness of breath over the past 6 to 12 months which continues to worsen.  Her most recent echocardiogram on 12/19/2019 shows reduction in her ejection fraction to 40 to 45% which was felt to be due to RV apical pacing.  She has moderate concentric left ventricular hypertrophy.  The left ventricular internal cavity is moderately dilated.  The aortic valve has an indeterminate number of cusps with severe thickening and calcification.  There is mild aortic insufficiency.  The mean aortic valve gradient is 26.5 mmHg with an aortic valve area of 0.63 cm. Dimensionless index of 0.2 suggesting a severe, low flow, low gradient aortic stenosis. L/RHC showed widely patent coronary arteries. The CTA of the chest, abdomen, and pelvis showed a chronic type A and B dissection with persistent false lumen involving the aortic arch beyond the suture line just proximal to the innominate artery and extending throughout the entire aortic arch and descending thoracic aorta.  The patient has been evaluated by the multidisciplinary valve [name] and felt to have severe, symptomatic aortic stenosis and to be a suitable candidate for TAVR via the transapical approach, which was set up for 01/29/20.    Hospital Course     Consultants: none    Severe AS:s/p successful TAVR with a 23 mm Edwards Sapien 3 Ultra THV via the TA approach on 01/29/20. Post operative echo showed EF 45-50%, normally functioning TAVR with a mean gradient of 9.38mm Hg and no PVL. Chest tube removed 01/30/20. Groin site and inframammary sites are stable. ECG with paced rhythm. Started on Asprin and Plavix. She had some weakness and PT recommended HH PT.   Acute on chronic diastolic CHF: as evidenced by an elevated BNP on pre admission lab work and mild cardiomegaly/ pleural effusions on CXR. This has been treated with TAVR. Resumed on home lasix.   CKD stage IIIb: creat 1.5 today. Resume home lasix and low dose ARB. Will follow kidney function next week in office.  HTN: resume home medications.   DMT2: treated with SSI. Resume home regimen at discharge.   Depression/anxiety: called by pharmacy who decreased dose of Celexa (60mg  -- >20mg  daily) given prolonged QTc. Will have her follow up with her PCP  after discharge. She also requested a refill on her Xanax. I gave her a short supply and she knows she need to see PCP for further refills.     Incidental findings: pre TAVR CT showed a right adrenal nodule stable in size since 06/28/2019 CT, probably a benign adenoma. Follow-up CT abdomen without and with IV contrast in 12 months suggested.   _____________  Discharge Vitals Blood pressure (!) 141/55, pulse 67, temperature 97.9 F (36.6 C), temperature source Oral, resp. rate 20, height 5' (1.524 m), weight 76.4 kg, SpO2 98 %.  Filed Weights   01/30/20 0630 01/31/20 0425 02/01/20 0534  Weight: 76.8 kg 78.2 kg 76.4 kg    GEN: Well nourished, well developed, in no acute distress, obese HEENT: normal Neck: no JVD or masses Cardiac: RRR; soft flow murmur. No rubs, or gallops,no edema  Respiratory:  clear to auscultation bilaterally, normal work of breathing GI: soft, nontender, nondistended, + BS MS: no deformity or atrophy Skin: warm and dry, no rash.  Inframammary site and groin site without hematoma or ecchymosis Neuro:  Alert and Oriented x 3, Strength and sensation are intact Psych: euthymic mood, full affect    Labs & Radiologic Studies    CBC Recent Labs    01/31/20 0450 02/01/20 0213  WBC 4.2 4.0  HGB 10.2* 9.6*  HCT 31.2* 29.5*  MCV 92.3 92.8  PLT 88* 92*   Basic Metabolic Panel Recent Labs    01/30/20 0343 01/30/20 0343 01/31/20 0450 02/01/20 0213  NA 132*   < > 127* 134*  K 5.1   < > 4.4 3.9  CL 105   < > 102 106  CO2 17*   < > 18* 20*  GLUCOSE 119*   < > 97 95  BUN 46*   < > 52* 45*  CREATININE 1.44*   < > 1.68* 1.50*  CALCIUM 8.5*   < > 8.2* 8.6*  MG 1.7  --   --   --    < > = values in this interval not displayed.   Liver Function Tests No results for input(s): AST, ALT, ALKPHOS, BILITOT, PROT, ALBUMIN in the last 72 hours. No results for input(s): LIPASE, AMYLASE in the last 72 hours. Cardiac Enzymes No results for input(s): CKTOTAL, CKMB, CKMBINDEX, TROPONINI in the last 72 hours. BNP Invalid input(s): POCBNP D-Dimer No results for input(s): DDIMER in the last 72 hours. Hemoglobin A1C No results for input(s): HGBA1C in the last 72 hours. Fasting Lipid Panel No results for input(s): CHOL, HDL, LDLCALC, TRIG, CHOLHDL, LDLDIRECT in the last 72 hours. Thyroid Function Tests No results for input(s): TSH, T4TOTAL, T3FREE, THYROIDAB in the last 72 hours.  Invalid input(s): FREET3 _____________  DG Chest 2 View  Result Date: 01/31/2020 CLINICAL DATA:  Thoracotomy. TAVR 2 days ago. EXAM: CHEST - 2 VIEW COMPARISON:  One-view chest x-ray 01/30/2020 FINDINGS: Heart is enlarged. Pacing and defibrillator wires are stable. Aortic valve replacement is noted. Left-sided chest tube has resolved. Bibasilar airspace opacities are increasing. Small effusions are present. IMPRESSION: Increasing bibasilar airspace disease and small effusions. While this likely reflects atelectasis, infection is not excluded.  Electronically Signed   By: San Morelle M.D.   On: 01/31/2020 06:12   DG Chest 2 View  Result Date: 01/25/2020 CLINICAL DATA:  Pre admit for severe aortic stenosis. EXAM: CHEST - 2 VIEW COMPARISON:  One-view chest x-ray 06/29/2019 FINDINGS: The heart is mildly enlarged, exaggerated by low lung volumes. Pacing and defibrillator wires are  in stable and satisfactory position. Lungs are clear. No edema or effusion is present. Focal airspace disease is present. Exaggerated thoracic kyphosis is noted. IMPRESSION: No acute cardiopulmonary disease or significant interval change. Electronically Signed   By: San Morelle M.D.   On: 01/25/2020 10:01   CARDIAC CATHETERIZATION  Result Date: 01/09/2020 1.  Widely patent coronary arteries with minimal nonobstructive RCA stenosis 2.  Severe aortic stenosis with mean transvalvular gradient 26 mmHg and calculated valve area 0.8 cm 3.  Mild pulmonary hypertension with mean PA pressure 31 mmHg Plan: Continue TAVR evaluation  DG CHEST PORT 1 VIEW  Result Date: 01/30/2020 CLINICAL DATA:  Status post TAVR, weakness, chest tube EXAM: PORTABLE CHEST 1 VIEW COMPARISON:  Chest radiograph from one day prior. FINDINGS: Stable configuration of 3 lead left subclavian ICD. Intact sternotomy wires. Right internal jugular central venous catheter terminates over the cavoatrial junction. Aortic valve prosthesis in place. Stable left chest tube position. Stable cardiomediastinal silhouette with moderate cardiomegaly. Tiny left apical pneumothorax, less than 5%, not definitely seen on prior. No right pneumothorax. Stable small right pleural effusion. No left pleural effusion. No overt pulmonary edema. Stable mild left mid lung and bibasilar atelectasis. IMPRESSION: 1. Tiny left apical pneumothorax, less than 5%, not definitely seen on prior. Left chest tube in place. 2. Stable small right pleural effusion. 3. Stable mild left mid lung and bibasilar atelectasis. 4. Stable  cardiomegaly without overt pulmonary edema. Electronically Signed   By: Ilona Sorrel M.D.   On: 01/30/2020 09:24   DG Chest Port 1 View  Result Date: 01/29/2020 CLINICAL DATA:  Severe mid and left back pain after transapical TAVR. EXAM: PORTABLE CHEST 1 VIEW COMPARISON:  Chest x-ray dated 01/25/2020 FINDINGS: Chronic mild cardiomegaly. TAVR in place. AICD in place. One lead is not attached to the generator. Central venous catheter tip in the upper right atrium. Pulmonary vascularity is at the upper limits of normal. No infiltrates or effusions. No pneumothorax. Left chest tube in place. IMPRESSION: No acute abnormality. Chronic mild cardiomegaly. Electronically Signed   By: Lorriane Shire M.D.   On: 01/29/2020 16:02   ECHOCARDIOGRAM COMPLETE  Result Date: 01/30/2020    ECHOCARDIOGRAM REPORT   Patient Name:   [name] Date of Exam: 01/30/2020 Medical Rec #:  [id number]       Height:       60.0 in Accession #:    [id number]      Weight:       169.3 lb Date of Birth:  [birth year]       BSA:          1.739 m Patient Age:    58 years        BP:           104/75 mmHg Patient Gender: F               HR:           64 bpm. Exam Location:  Inpatient Procedure: 2D Echo, Cardiac Doppler and Color Doppler Indications:    Post TAVR evaluation V43.3  History:        Patient has prior history of Echocardiogram examinations, most                 recent 12/19/2019. Arrythmias:Atrial Flutter; Risk                 Factors:Hypertension, Diabetes, Dyslipidemia and Former Smoker.  GERD. Type A dissection. VT.                 Aortic Valve: 23 mm Edwards Sapien prosthetic, stented (TAVR)                 valve is present in the aortic position. Procedure Date:                 01/29/2020.  Sonographer:    Vickie Epley RDCS Referring Phys: [id number] [place]  1. Left ventricular ejection fraction, by estimation, is 45 to 50%. The left ventricle has mildly decreased function. The left ventricle has no  regional wall motion abnormalities. There is mild concentric left ventricular hypertrophy. Left ventricular diastolic parameters are consistent with Grade II diastolic dysfunction (pseudonormalization). Elevated left atrial pressure.  2. Right ventricular systolic function is mildly reduced. The right ventricular size is normal. There is moderately elevated pulmonary artery systolic pressure. The estimated right ventricular systolic pressure is 0000000 mmHg.  3. Left atrial size was moderately dilated.  4. Right atrial size was moderately dilated.  5. The mitral valve is normal in structure and function. Mild mitral valve regurgitation.  6. Tricuspid valve regurgitation is mild to moderate.  7. The aortic valve has been repaired/replaced. Aortic valve regurgitation is not visualized. There is a 23 mm Edwards Sapien prosthetic (TAVR) valve present in the aortic position. Procedure Date: 01/29/2020. Echo findings are consistent with normal structure and function of the aortic valve prosthesis. Aortic valve area, by VTI measures 2.07 cm. Aortic valve mean gradient measures 9.5 mmHg. Aortic valve Vmax measures 2.10 m/s. Dimensionless index 0.66.  8. Aortic root/ascending aorta has been repaired/replaced.  9. The inferior vena cava is dilated in size with >50% respiratory variability, suggesting right atrial pressure of 8 mmHg. FINDINGS  Left Ventricle: Left ventricular ejection fraction, by estimation, is 45 to 50%. The left ventricle has mildly decreased function. The left ventricle has no regional wall motion abnormalities. The left ventricular internal cavity size was normal in size. There is mild concentric left ventricular hypertrophy. Abnormal (paradoxical) septal motion, consistent with RV pacemaker. Left ventricular diastolic parameters are consistent with Grade II diastolic dysfunction (pseudonormalization). Elevated left  atrial pressure. Right Ventricle: The right ventricular size is normal. No increase in  right ventricular wall thickness. Right ventricular systolic function is mildly reduced. There is moderately elevated pulmonary artery systolic pressure. The tricuspid regurgitant velocity is 3.39 m/s, and with an assumed right atrial pressure of 8 mmHg, the estimated right ventricular systolic pressure is 0000000 mmHg. Left Atrium: Left atrial size was moderately dilated. Right Atrium: Right atrial size was moderately dilated. Pericardium: There is no evidence of pericardial effusion. Mitral Valve: The mitral valve is normal in structure and function. Mild mitral valve regurgitation. Tricuspid Valve: Diastolic TR is seen due to long AV delay. The tricuspid valve is normal in structure. Tricuspid valve regurgitation is mild to moderate. Aortic Valve: The aortic valve has been repaired/replaced. Aortic valve regurgitation is not visualized. Aortic valve mean gradient measures 9.5 mmHg. Aortic valve peak gradient measures 17.6 mmHg. Aortic valve area, by VTI measures 2.07 cm. Dimensionless index 0.66. There is a 23 mm Edwards Sapien prosthetic, stented (TAVR) valve present in the aortic position. Procedure Date: 01/29/2020. Echo findings are consistent with normal structure and function of the aortic valve prosthesis. Pulmonic Valve: The pulmonic valve was grossly normal. Pulmonic valve regurgitation is not visualized. Aorta: The aortic root/ascending aorta has been repaired/replaced. Venous: The inferior  vena cava is dilated in size with greater than 50% respiratory variability, suggesting right atrial pressure of 8 mmHg. IAS/Shunts: No atrial level shunt detected by color flow Doppler.  LEFT VENTRICLE PLAX 2D LVIDd:         5.10 cm      Diastology LVIDs:         3.90 cm      LV e' lateral:   6.00 cm/s LV PW:         0.90 cm      LV E/e' lateral: 14.1 LV IVS:        0.90 cm      LV e' medial:    5.27 cm/s LVOT diam:     2.00 cm      LV E/e' medial:  16.0 LV SV:         99 LV SV Index:   57 LVOT Area:     3.14 cm  LV  Volumes (MOD) LV vol d, MOD A2C: 152.0 ml LV vol d, MOD A4C: 123.0 ml LV vol s, MOD A2C: 112.0 ml LV vol s, MOD A4C: 75.7 ml LV SV MOD A2C:     40.0 ml LV SV MOD A4C:     123.0 ml LV SV MOD BP:      45.7 ml RIGHT VENTRICLE RV S prime:     5.98 cm/s TAPSE (M-mode): 1.5 cm LEFT ATRIUM             Index       RIGHT ATRIUM           Index LA diam:        4.50 cm 2.59 cm/m  RA Area:     19.40 cm LA Vol (A2C):   42.9 ml 24.67 ml/m RA Volume:   57.50 ml  33.06 ml/m LA Vol (A4C):   57.1 ml 32.83 ml/m LA Biplane Vol: 50.9 ml 29.27 ml/m  AORTIC VALVE AV Area (Vmax):    2.11 cm AV Area (Vmean):   2.25 cm AV Area (VTI):     2.07 cm AV Vmax:           210.00 cm/s AV Vmean:          141.000 cm/s AV VTI:            0.478 m AV Peak Grad:      17.6 mmHg AV Mean Grad:      9.5 mmHg LVOT Vmax:         141.00 cm/s LVOT Vmean:        101.000 cm/s LVOT VTI:          0.315 m LVOT/AV VTI ratio: 0.66 MITRAL VALVE               TRICUSPID VALVE MV Area (PHT): 3.21 cm    TR Peak grad:   46.0 mmHg MV Decel Time: 236 msec    TR Vmax:        339.00 cm/s MV E velocity: 84.40 cm/s MV A velocity: 71.10 cm/s  SHUNTS MV E/A ratio:  1.19        Systemic VTI:  0.32 m                            Systemic Diam: 2.00 cm Dani Gobble Croitoru MD Electronically signed by Anna Klein MD Signature Date/Time: 01/30/2020/8:30:38 PM    Final    ECHO INTRAOPERATIVE TEE  Result Date: 01/29/2020  *  INTRAOPERATIVE TRANSESOPHAGEAL REPORT *  Patient Name:   [name] Date of Exam: 01/29/2020 Medical Rec #:  [id number]       Height:       60.0 in Accession #:    [id number]      Weight:       160.0 lb Date of Birth:  04-Oct-1946       BSA:          1.70 m Patient Age:    80 years        BP:           132/45 mmHg Patient Gender: F               HR:           62 bpm. Exam Location:  Inpatient Transesophogeal exam was perform intraoperatively during surgical procedure. Patient was closely monitored under general anesthesia during the entirety of examination.  Indications:     Aortic stenosis 424.1 / I35.0 Sonographer:     Darlina Sicilian RDCS Performing Phys: [id number] Latorya Bautch Diagnosing Phys: Anna Klein MD Complications: No known complications during this procedure.  POST-OP IMPRESSIONS - Aortic Valve: A Sapien bioprosthetic valve was placed Manufactured by; an Edwards Size; 36mm.  S/P successful deployment of a 23 mm Sapien 3 TAVR stent valve. Mildly reduced left ventricular systolic function. EF 45-50%. No change in regional wall motion. Trileaflet aortic valve with severe calcific aortic stenosis. Peak gradient 3 mm Hg, mean gradient 2 mm Hg, dimensionless obstructive index 0.81, calculated aortic valve area 2.3 cm sq. There is mild-moderate perivalvular aortic regurgitation after the initial deployment. After balloon reexpansion, there is mild perivalvular leak, at the posterior annulus. There is trivial mitral regurgitation. There is no pericardial effusion. The ascending aorta has been repaired. There is residual dissection with broad patency of the true and false lumen throughout the aortic arch and the descending thoracic aorta. All aortic segments are normal in caliber. PRE-OP FINDINGS Mildly reduced left ventricular systolic function. EF 45-50%. Normal regional wall motion, paradoxical septal motion due to RV apical pacing. Pseudonormal mitral inflow pattern, elevated mean left atrial pressure. Trileaflet aortic valve with moderate-severe calcific aortic stenosis. Peak gradient 40 mm Hg, mean gradient 19 mm Hg, dimensionless obstructive index 0.32 , calculated aortic valve area 1.0 cm sq. There is moderate to severe aortic insufficiency. There is mild mitral regurgitation. There is no pericardial effusion. No change in the appearance of the aortic dissection. Left Ventricle: The left ventricle has low normal systolic function, with an ejection fraction of 50-55%. The cavity size was normal. There is mild concentric left ventricular hypertrophy. There is  abnormal (paradoxical) septal motion, consistent with RV  pacemaker. No evidence of left ventricular regional wall motion abnormalities. Right Ventricle: The right ventricle has normal systolic function. The cavity was normal. There is no increase in right ventricular wall thickness. Pacing wire/catheter visualized in the right ventricle. Left Atrium: Left atrial size was dilated. Right Atrium: Right atrial size was dilated. A small slightly mobile thrombus (6 mm length) is attached to one of the pacing leads, just above the tricuspid annulus. Interatrial Septum: No atrial level shunt detected by color flow Doppler. Pericardium: There is no evidence of pericardial effusion. Mitral Valve: The mitral valve is normal in structure. Mitral valve regurgitation is trivial by color flow Doppler. Tricuspid Valve: The tricuspid valve was normal in structure. Tricuspid valve regurgitation is mild-moderate by color flow Doppler. Aortic Valve: The aortic valve is tricuspid There  is severe thickening of the aortic valve and There is moderate calcification of the aortic valve Aortic valve regurgitation is moderate to severe by color flow Doppler. There is moderate-severe stenosis of the aortic valve, with a calculated valve area of 2.28 cm. 23 mm Edwards Sapien prosthetic, stented (TAVR) valve is present in the aortic position. Procedure Date: 01/29/2020. Pulmonic Valve: The pulmonic valve was normal in structure. Pulmonic valve regurgitation is not visualized by color flow Doppler. Aorta: The aortic root, ascending aorta and aortic arch are normal in size. There is evidence of a dissection in the aortic arch and descending thoracic aorta. The dissection can be classified as a Stanford type A (proximal). +--------------+--------++ LEFT VENTRICLE         +--------------+--------++ PLAX 2D                +--------------+--------++ LVOT diam:    2.00 cm  +--------------+--------++ LVOT Area:    3.14 cm  +--------------+--------++                        +--------------+--------++ +---------------+---------++ RIGHT VENTRICLE          +---------------+---------++ RVSP:          29.0 mmHg +---------------+---------++ +------------+---------+++ RIGHT ATRIUM          +------------+---------+++ RA Pressure:3.00 mmHg +------------+---------+++  +--------------+-----------++ AORTIC VALVE              +--------------+-----------++ AV Area (VTI):2.28 cm    +--------------+-----------++ AV Vmax:      84.90 cm/s  +--------------+-----------++ AV Vmean:     62.300 cm/s +--------------+-----------++ AV Peak Grad: 2.9 mmHg    +--------------+-----------++ +---------------+-----------++ TRICUSPID VALVE            +---------------+-----------++ TR Peak grad:  26.0 mmHg   +---------------+-----------++ TR Vmax:       255.00 cm/s +---------------+-----------++ Estimated RAP: 3.00 mmHg   +---------------+-----------++ RVSP:          29.0 mmHg   +---------------+-----------++  +--------------+-------+ SHUNTS                +--------------+-------+ Systemic Diam:2.00 cm +--------------+-------+  Anna Klein MD Electronically signed by Anna Klein MD Signature Date/Time: 01/29/2020/6:22:09 PM    Final    Structural Heart Procedure  Result Date: 01/29/2020 See surgical note for result.  Disposition   Pt is being discharged home today in good condition.  Follow-up Plans & Appointments    Follow-up Information    Marylee, [place], PA-C. Go on 02/07/2020.   Specialties: Cardiology, Radiology Why: @ 1:30pm, please arrive at least 10 minutes early.  Contact information: [place] [id number] [phone]            Discharge Medications   Allergies as of 02/01/2020      Reactions   Oxycodone Itching   Vicodin [hydrocodone-acetaminophen] Itching   Latex Itching, Rash   Neomycin-bacitracin Zn-polymyx Other (See  Comments)   Red , swollen eye   Sulfonamide Derivatives Other (See Comments)   Just had reaction when taking during pregnancy- last child birth 27      Medication List    STOP taking these medications   acetaminophen 500 MG tablet Commonly known as: TYLENOL     TAKE these medications   ALPRAZolam 0.5 MG tablet Commonly known as: XANAX Take 1 tablet (0.5 mg total) by mouth 3 (three) times daily as needed for anxiety.   aspirin 81 MG chewable tablet Chew 1  tablet (81 mg total) by mouth daily. Start taking on: February 02, 2020   carvedilol 25 MG tablet Commonly known as: COREG TAKE 1 TABLET BY MOUTH TWICE A DAY WITH A MEAL What changed: See the new instructions.   citalopram 20 MG tablet Commonly known as: CELEXA Take 1 tablet (20 mg total) by mouth daily. What changed: how much to take   clopidogrel 75 MG tablet Commonly known as: PLAVIX Take 1 tablet (75 mg total) by mouth daily with breakfast. Start taking on: February 02, 2020   ferrous sulfate 325 (65 FE) MG tablet Take 325 mg by mouth daily with breakfast.   Fish Oil 1000 MG Caps Take 1,000 mg by mouth daily.   fluticasone 50 MCG/ACT nasal spray Commonly known as: FLONASE USE 2 SPRAYS IN EACH NOSTIRL EVERY DAY What changed: See the new instructions.   furosemide 20 MG tablet Commonly known as: LASIX Take 1 tablet (20 mg total) by mouth daily. Okay to take 1 extra tablet as needed for swelling. What changed:   when to take this  reasons to take this  additional instructions   hyoscyamine 0.125 MG SL tablet Commonly known as: LEVSIN SL TAKE 1 TABLET (0.125 MG TOTAL) BY MOUTH EVERY 6 (SIX) HOURS AS NEEDED.   Jardiance 10 MG Tabs tablet Generic drug: empagliflozin TAKE 1 TABLET(10 MG) BY MOUTH DAILY. What changed: See the new instructions.   loperamide 2 MG capsule Commonly known as: IMODIUM Take 2 mg by mouth as needed for diarrhea or loose stools.   losartan 25 MG tablet Commonly known as:  COZAAR Take 0.5 tablets (12.5 mg total) by mouth at bedtime.   oxyCODONE-acetaminophen 5-325 MG tablet Commonly known as: Percocet Take 1 tablet by mouth every 4 (four) hours as needed for severe pain.   Potassium Chloride ER 20 MEQ Tbcr TAKE 1 TABLET BY MOUTH EVERY DAY What changed: how much to take   PRO-BIOTIC BLEND PO Take 1 tablet by mouth daily.   rosuvastatin 20 MG tablet Commonly known as: CRESTOR TAKE 1 TABLET BY MOUTH EVERY DAY   SYSTANE OP Place 1 drop into both eyes daily as needed (Dry eyes).   vitamin C 1000 MG tablet Take 1,000 mg by mouth daily.            Durable Medical Equipment  (From admission, onward)         Start     Ordered   02/01/20 1024  For home use only DME Shower stool  Once     02/01/20 1025   02/01/20 1023  For home use only DME Bedside commode  Once    Question:  Patient needs a bedside commode to treat with the following condition  Answer:  Impaired gait and mobility   02/01/20 1025   02/01/20 1021  For home use only DME 3 n 1  Once     02/01/20 1020              Outstanding Labs/Studies   BMET   Duration of Discharge Encounter   Greater than 30 minutes including physician time.  SignedAarica, Gustafson, PA-C 02/01/2020, 10:50 AM [phone]  Patient seen, examined. Available data reviewed. Agree with findings, assessment, and plan as outlined by Nell Range, PA-C.  The patient is independently interviewed and examined.  She continues to progress well.  On my exam she is alert, oriented, in no distress.  JVP is normal, lung fields are clear, heart is regular rate and rhythm with  a 2/6 systolic murmur at the right upper sternal border, abdomen is soft and nontender, and extremities have no edema.  I think the patient is stable for discharge today.  Her daughter will stay with her over the next 3 or 4 days.  Telemetry is reviewed and shows a ventricular paced rhythm with no significant arrhythmia.  Her TAVR valve is  functioning normally on review of her postoperative echo.  All of her questions are answered.  Follow-up is been arranged.  [name], M.D. 02/01/2020 11:58 AM

## 2020-01-30 NOTE — Plan of Care (Signed)
  Problem: Clinical Measurements: Goal: Ability to maintain clinical measurements within normal limits will improve Outcome: Progressing Goal: Diagnostic test results will improve Outcome: Progressing Goal: Respiratory complications will improve Outcome: Progressing   Problem: Cardiac: Goal: Will achieve and/or maintain hemodynamic stability Outcome: Progressing   Problem: Respiratory: Goal: Respiratory status will improve Outcome: Progressing

## 2020-01-30 NOTE — Progress Notes (Signed)
Patient has only had 155mls of urine output since 6am this morning. Dr. Cyndia Bent notified. Stated to continue to monitor, no new orders.  Joellen Jersey, RN

## 2020-01-31 ENCOUNTER — Inpatient Hospital Stay (HOSPITAL_COMMUNITY): Payer: Medicare HMO

## 2020-01-31 DIAGNOSIS — Z952 Presence of prosthetic heart valve: Secondary | ICD-10-CM

## 2020-01-31 LAB — GLUCOSE, CAPILLARY
Glucose-Capillary: 94 mg/dL (ref 70–99)
Glucose-Capillary: 90 mg/dL (ref 70–99)
Glucose-Capillary: 90 mg/dL (ref 70–99)
Glucose-Capillary: 118 mg/dL — ABNORMAL HIGH (ref 70–99)

## 2020-01-31 LAB — CBC
HCT: 31.2 % — ABNORMAL LOW (ref 36.0–46.0)
Hemoglobin: 10.2 g/dL — ABNORMAL LOW (ref 12.0–15.0)
MCH: 30.2 pg (ref 26.0–34.0)
MCHC: 32.7 g/dL (ref 30.0–36.0)
MCV: 92.3 fL (ref 80.0–100.0)
Platelets: 88 10*3/uL — ABNORMAL LOW (ref 150–400)
RBC: 3.38 MIL/uL — ABNORMAL LOW (ref 3.87–5.11)
RDW: 16.2 % — ABNORMAL HIGH (ref 11.5–15.5)
WBC: 4.2 10*3/uL (ref 4.0–10.5)
nRBC: 0 % (ref 0.0–0.2)

## 2020-01-31 LAB — BASIC METABOLIC PANEL
Anion gap: 7 (ref 5–15)
BUN: 52 mg/dL — ABNORMAL HIGH (ref 8–23)
CO2: 18 mmol/L — ABNORMAL LOW (ref 22–32)
Calcium: 8.2 mg/dL — ABNORMAL LOW (ref 8.9–10.3)
Chloride: 102 mmol/L (ref 98–111)
Creatinine, Ser: 1.68 mg/dL — ABNORMAL HIGH (ref 0.44–1.00)
GFR calc Af Amer: 35 mL/min — ABNORMAL LOW (ref >60)
GFR calc non Af Amer: 30 mL/min — ABNORMAL LOW (ref >60)
Glucose, Bld: 97 mg/dL (ref 70–99)
Potassium: 4.4 mmol/L (ref 3.5–5.1)
Sodium: 127 mmol/L — ABNORMAL LOW (ref 135–145)

## 2020-01-31 MED ORDER — CALCIUM CARBONATE ANTACID 500 MG PO CHEW
1.0000 | CHEWABLE_TABLET | ORAL | Status: DC | PRN
  Administered 2020-01-31 (×2): 200 mg via ORAL
  Filled 2020-01-31 (×2): qty 1

## 2020-01-31 MED ORDER — PANTOPRAZOLE SODIUM 40 MG PO TBEC
40.0000 mg | DELAYED_RELEASE_TABLET | Freq: Every day | ORAL | Status: DC
  Administered 2020-01-31 – 2020-02-01 (×2): 40 mg via ORAL
  Filled 2020-01-31 (×2): qty 1

## 2020-01-31 NOTE — Progress Notes (Addendum)
MOBILITY TEAM - Progress Note   01/31/20 0900  Mobility  Activity Ambulated to bathroom;Ambulated in hall  Level of Assistance Minimal assist, patient does 75% or more  Assistive Device Front wheel walker  Distance Ambulated (ft) 80 ft  Mobility Response Tolerated well  Bed Position  (seated in recliner)   Pre-activity: HR 60, BP 147/58, SpO2 99% on 1L (removed O2 [place] with SpO2 maintaining 93% on RA) Post-activity: HR 69, BP 131/45, SpO2 88% on RA (returned to 1L O2)  Patient with initial c/o dizziness upon sitting with BP 147/58, this subsided with rest; asymptomatic rest of session. Pt current requires assist to stand from lower surface heights. Recommend PT Evaluation to assess for d/c needs (verbal request for Dr. Burt Knack to place this order).  Mabeline Caras, PT, DPT Mobility Team Pager [phone]

## 2020-01-31 NOTE — Progress Notes (Signed)
CARDIAC REHAB PHASE I   PRE:  Rate/Rhythm: paced 61  BP:  Supine: 138/57  Sitting:   Standing:    SaO2: 92%RA  MODE:  Ambulation: 60 ft   POST:  Rate/Rhythm: paced 70  BP:  Supine:   Sitting: 145/49  Standing:    SaO2: 92%RA 1437-1515 Assisted pt to bathroom and then she walked 60 ft on RA with walker. I monitored sats on ear and maintained 92%RA. Pt needs a little assistance with getting out of bed but steadier than our walk yesterday. Third walk today.   Graylon Good, RN BSN  01/31/2020 3:11 PM

## 2020-01-31 NOTE — Evaluation (Signed)
Physical Therapy Evaluation Patient Details Name: [name] MRN: [id number] DOB: Feb 08, 1946 Today's Date: 01/31/2020   History of Present Illness  Pt is a 74 y/o female s/p TAVR. PMH includes HTN, CHF, a fib, DM, CKD, and s/p ICD.   Clinical Impression  Pt admitted secondary to problem above with deficits below. Pt requiring mod A for bed mobility, however, min to min guard for OOB mobility using RW. Feel pt would benefit from HHPT at d/c given current deficits. Will continue to follow acutely to maximize functional mobility independence and safety.     Follow Up Recommendations Home health PT;Supervision for mobility/OOB    Equipment Recommendations  3in1 (PT)    Recommendations for Other Services       Precautions / Restrictions Precautions Precautions: Fall Restrictions Weight Bearing Restrictions: No      Mobility  Bed Mobility Overal bed mobility: Needs Assistance Bed Mobility: Supine to Sit;Sit to Supine     Supine to sit: Mod assist Sit to supine: Min assist   General bed mobility comments: Mod A for trunk elevation and assist to scoot hips to EOB. Min A for LE assist for return to supine.   Transfers Overall transfer level: Needs assistance Equipment used: Rolling walker (2 wheeled) Transfers: Sit to/from Stand Sit to Stand: Min assist         General transfer comment: Heavy min A for lift assist and steadying. Cues for safe hand placement.   Ambulation/Gait Ambulation/Gait assistance: Min guard Gait Distance (Feet): 15 Feet Assistive device: Rolling walker (2 wheeled) Gait Pattern/deviations: Step-through pattern;Decreased stride length Gait velocity: Decreased   General Gait Details: Slow, cautious gait. pt requesting to stay in the room.   Stairs            Wheelchair Mobility    Modified Rankin (Stroke Patients Only)       Balance Overall balance assessment: Needs assistance Sitting-balance support: No upper extremity  supported;Feet supported Sitting balance-Leahy Scale: Good     Standing balance support: Bilateral upper extremity supported;During functional activity Standing balance-Leahy Scale: Poor Standing balance comment: Reliant on BUE support                              Pertinent Vitals/Pain Pain Assessment: Faces Faces Pain Scale: Hurts little more Pain Location: chest Pain Descriptors / Indicators: Sore Pain Intervention(s): Monitored during session;Limited activity within patient's tolerance;Repositioned    Home Living Family/patient expects to be discharged to:: Private residence Living Arrangements: Children Available Help at Discharge: Family;Available PRN/intermittently Type of Home: House Home Access: Level entry     Home Layout: One level Home Equipment: Walker - 4 wheels      Prior Function Level of Independence: Independent               Hand Dominance        Extremity/Trunk Assessment   Upper Extremity Assessment Upper Extremity Assessment: Generalized weakness    Lower Extremity Assessment Lower Extremity Assessment: Generalized weakness    Cervical / Trunk Assessment Cervical / Trunk Assessment: Normal  Communication   Communication: No difficulties  Cognition Arousal/Alertness: Awake/alert Behavior During Therapy: WFL for tasks assessed/performed Overall Cognitive Status: Within Functional Limits for tasks assessed                                        General  Comments      Exercises     Assessment/Plan    PT Assessment Patient needs continued PT services  PT Problem List Decreased strength;Decreased balance;Decreased activity tolerance;Decreased mobility;Decreased knowledge of use of DME       PT Treatment Interventions Gait training;Functional mobility training;Therapeutic activities;Therapeutic exercise;Balance training;Patient/family education    PT Goals (Current goals can be found in the Care Plan  section)  Acute Rehab PT Goals Patient Stated Goal: to go home PT Goal Formulation: With patient Time For Goal Achievement: 02/14/20 Potential to Achieve Goals: Good    Frequency Min 3X/week   Barriers to discharge        Co-evaluation               AM-PAC PT "6 Clicks" Mobility  Outcome Measure Help needed turning from your back to your side while in a flat bed without using bedrails?: A Little Help needed moving from lying on your back to sitting on the side of a flat bed without using bedrails?: A Lot Help needed moving to and from a bed to a chair (including a wheelchair)?: A Little Help needed standing up from a chair using your arms (e.g., wheelchair or bedside chair)?: A Little Help needed to walk in hospital room?: A Little Help needed climbing 3-5 steps with a railing? : A Lot 6 Click Score: 16    End of Session   Activity Tolerance: Patient tolerated treatment well Patient left: in bed;with call bell/phone within reach;with family/visitor present Nurse Communication: Mobility status PT Visit Diagnosis: Unsteadiness on feet (R26.81);Muscle weakness (generalized) (M62.81)    Time: [id number] PT Time Calculation (min) (ACUTE ONLY): 16 min   Charges:   PT Evaluation $PT Eval Moderate Complexity: 1 Mod          [name], PT, DPT  Acute Rehabilitation Services  Pager: [phone] Office: [phone]   Rudean Hitt 01/31/2020, 4:51 PM

## 2020-01-31 NOTE — Plan of Care (Signed)
Continue to monitor

## 2020-01-31 NOTE — Progress Notes (Addendum)
[place] VALVE TEAM  Patient Name: [name] Date of Encounter: 01/31/2020  Primary Cardiologist: Sanda Klein, MD / Dr. Burt Knack & Dr. 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 Had a lot of pain at her incision yesterday. Now feeling better. Back pain improved.   Inpatient Medications    Scheduled Meds: . aspirin  81 mg Oral Daily  . citalopram  20 mg Oral Daily  . clopidogrel  75 mg Oral Q breakfast  . ferrous sulfate  325 mg Oral Q breakfast  . insulin aspart  0-24 Units Subcutaneous TID AC & HS  . rosuvastatin  20 mg Oral Daily  . sodium chloride flush  3 mL Intravenous Q12H   Continuous Infusions: . cefUROXime (ZINACEF)  IV 1.5 g (01/31/20 1004)   PRN Meds: acetaminophen **OR** acetaminophen, ALPRAZolam, fluticasone, hydrALAZINE, morphine injection, ondansetron (ZOFRAN) IV, oxyCODONE, sodium chloride flush, traMADol   Vital Signs    Vitals:   01/31/20 0425 01/31/20 0700 01/31/20 0800 01/31/20 0826  BP: (!) 122/54   (!) 108/48  Pulse: 63 61 60 62  Resp: 18 19 19 18   Temp: 98.6 F (37 C)   98.2 F (36.8 C)  TempSrc: Oral    Oral  SpO2: 95% 94% 98% 97%  Weight: 78.2 kg     Height:        Intake/Output Summary (Last 24 hours) at 01/31/2020 1012 Last data filed at 01/31/2020 0756 Gross per 24 hour  Intake 820 ml  Output 1035 ml  Net -215 ml   Filed Weights   01/29/20 0602 01/30/20 0630 01/31/20 0425  Weight: 72.6 kg 76.8 kg 78.2 kg    Physical Exam   GEN: Well nourished, well developed, in no acute distress. obese HEENT: Grossly normal.  Neck: Supple, no JVD, carotid bruits, or masses. Cardiac: RRR, soft flow murmur. No rubs, or gallops. No clubbing, cyanosis, edema.   Respiratory:  Respirations regular and unlabored, clear to auscultation bilaterally. GI: Soft, nontender, nondistended, BS + x 4. MS: no deformity or atrophy. Skin: warm and dry, no rash. Inframammary site and groin site without hematoma or ecchymosis Neuro:  Strength and sensation are intact. Psych: AAOx3.  Normal affect.  Labs    CBC Recent Labs    01/30/20 0343 01/31/20 0450  WBC 4.9 4.2  HGB 10.6* 10.2*  HCT 33.2* 31.2*  MCV 95.4 92.3  PLT 102* 88*   Basic Metabolic Panel Recent Labs    01/30/20 0343 01/31/20 0450  NA 132* 127*  K 5.1 4.4  CL 105 102  CO2 17* 18*  GLUCOSE 119* 97  BUN 46* 52*  CREATININE 1.44*  1.68*  CALCIUM 8.5* 8.2*  MG 1.7  --    Liver Function Tests No results for input(s): AST, ALT, ALKPHOS, BILITOT, PROT, ALBUMIN in the last 72 hours. No results for input(s): LIPASE, AMYLASE in the last 72 hours. Cardiac Enzymes No results for input(s): CKTOTAL, CKMB, CKMBINDEX, TROPONINI in the last 72 hours. BNP Invalid input(s): POCBNP D-Dimer No results for input(s): DDIMER in the last 72 hours. Hemoglobin A1C No results for input(s): HGBA1C in the last 72 hours. Fasting Lipid Panel No results for input(s): CHOL, HDL, LDLCALC, TRIG, CHOLHDL, LDLDIRECT in the last 72 hours. Thyroid Function Tests No results for input(s): TSH, T4TOTAL, T3FREE, THYROIDAB in the last 72 hours.  Invalid  input(s): FREET3  Telemetry    paced - Personally Reviewed  ECG    AV paced  - Personally Reviewed  Radiology    DG Chest 2 View  Result Date: 01/31/2020 CLINICAL DATA:  Thoracotomy. TAVR 2 days ago. EXAM: CHEST - 2 VIEW COMPARISON:  One-view chest x-ray 01/30/2020 FINDINGS: Heart is enlarged. Pacing and defibrillator wires are stable. Aortic valve replacement is noted. Left-sided chest tube has resolved. Bibasilar airspace opacities are increasing. Small effusions are present. IMPRESSION: Increasing bibasilar airspace disease and small effusions. While this likely reflects atelectasis, infection is not excluded. Electronically Signed   By: San Morelle M.D.   On: 01/31/2020 06:12   DG CHEST PORT 1 VIEW  Result Date: 01/30/2020 CLINICAL DATA:  Status post TAVR, weakness, chest tube EXAM: PORTABLE CHEST 1 VIEW COMPARISON:  Chest radiograph from one day prior. FINDINGS: Stable configuration of 3 lead left subclavian ICD. Intact sternotomy wires. Right internal jugular central venous catheter terminates over the cavoatrial junction. Aortic valve prosthesis in place. Stable left chest tube position. Stable cardiomediastinal silhouette with moderate cardiomegaly. Tiny left apical pneumothorax, less than 5%, not definitely seen on prior. No right pneumothorax. Stable small right pleural effusion. No left pleural effusion. No overt pulmonary edema. Stable mild left mid lung and bibasilar atelectasis. IMPRESSION: 1. Tiny left apical pneumothorax, less than 5%, not definitely seen on prior. Left chest tube in place. 2. Stable small right pleural effusion. 3. Stable mild left mid lung and bibasilar atelectasis. 4. Stable cardiomegaly without overt pulmonary edema. Electronically Signed   By: Ilona Sorrel M.D.   On: 01/30/2020 09:24   DG Chest Port 1 View  Result Date: 01/29/2020 CLINICAL DATA:  Severe mid and left back pain after transapical TAVR. EXAM: PORTABLE CHEST 1 VIEW COMPARISON:  Chest x-ray  dated 01/25/2020 FINDINGS: Chronic mild cardiomegaly. TAVR in place. AICD in place. One lead is not attached to the generator. Central venous catheter tip in the upper right atrium. Pulmonary vascularity is at the upper limits of normal. No infiltrates or effusions. No pneumothorax. Left chest tube in place. IMPRESSION: No acute abnormality. Chronic mild cardiomegaly. Electronically Signed   By: Lorriane Shire M.D.   On: 01/29/2020 16:02   ECHOCARDIOGRAM COMPLETE  Result Date: 01/30/2020    ECHOCARDIOGRAM REPORT   Patient Name:   [name] Date of Exam: 01/30/2020 Medical Rec #:  [id number]       Height:       60.0 in Accession #:    [id number]      Weight:       169.3 lb Date of Birth:  [birth year]       BSA:          1.739 m Patient Age:    57 years  BP:           104/75 mmHg Patient Gender: F               HR:           64 bpm. Exam Location:  Inpatient Procedure: 2D Echo, Cardiac Doppler and Color Doppler Indications:    Post TAVR evaluation V43.3  History:        Patient has prior history of Echocardiogram examinations, most                 recent 12/19/2019. Arrythmias:Atrial Flutter; Risk                 Factors:Hypertension, Diabetes, Dyslipidemia and Former Smoker.                 GERD. Type A dissection. VT.                 Aortic Valve: 23 mm Edwards Sapien prosthetic, stented (TAVR)                 valve is present in the aortic position. Procedure Date:                 01/29/2020.  Sonographer:    Vickie Epley RDCS Referring Phys: [id number] [place]  1. Left ventricular ejection fraction, by estimation, is 45 to 50%. The left ventricle has mildly decreased function. The left ventricle has no regional wall motion abnormalities. There is mild concentric left ventricular hypertrophy. Left ventricular diastolic parameters are consistent with Grade II diastolic dysfunction (pseudonormalization). Elevated left atrial pressure.  2. Right ventricular systolic function is mildly  reduced. The right ventricular size is normal. There is moderately elevated pulmonary artery systolic pressure. The estimated right ventricular systolic pressure is 0000000 mmHg.  3. Left atrial size was moderately dilated.  4. Right atrial size was moderately dilated.  5. The mitral valve is normal in structure and function. Mild mitral valve regurgitation.  6. Tricuspid valve regurgitation is mild to moderate.  7. The aortic valve has been repaired/replaced. Aortic valve regurgitation is not visualized. There is a 23 mm Edwards Sapien prosthetic (TAVR) valve present in the aortic position. Procedure Date: 01/29/2020. Echo findings are consistent with normal structure and function of the aortic valve prosthesis. Aortic valve area, by VTI measures 2.07 cm. Aortic valve mean gradient measures 9.5 mmHg. Aortic valve Vmax measures 2.10 m/s. Dimensionless index 0.66.  8. Aortic root/ascending aorta has been repaired/replaced.  9. The inferior vena cava is dilated in size with >50% respiratory variability, suggesting right atrial pressure of 8 mmHg. FINDINGS  Left Ventricle: Left ventricular ejection fraction, by estimation, is 45 to 50%. The left ventricle has mildly decreased function. The left ventricle has no regional wall motion abnormalities. The left ventricular internal cavity size was normal in size. There is mild concentric left ventricular hypertrophy. Abnormal (paradoxical) septal motion, consistent with RV pacemaker. Left ventricular diastolic parameters are consistent with Grade II diastolic dysfunction (pseudonormalization). Elevated left  atrial pressure. Right Ventricle: The right ventricular size is normal. No increase in right ventricular wall thickness. Right ventricular systolic function is mildly reduced. There is moderately elevated pulmonary artery systolic pressure. The tricuspid regurgitant velocity is 3.39 m/s, and with an assumed right atrial pressure of 8 mmHg, the estimated right ventricular  systolic pressure is 0000000 mmHg. Left Atrium: Left atrial size was moderately dilated. Right Atrium: Right atrial size was moderately dilated. Pericardium: There is no evidence of  pericardial effusion. Mitral Valve: The mitral valve is normal in structure and function. Mild mitral valve regurgitation. Tricuspid Valve: Diastolic TR is seen due to long AV delay. The tricuspid valve is normal in structure. Tricuspid valve regurgitation is mild to moderate. Aortic Valve: The aortic valve has been repaired/replaced. Aortic valve regurgitation is not visualized. Aortic valve mean gradient measures 9.5 mmHg. Aortic valve peak gradient measures 17.6 mmHg. Aortic valve area, by VTI measures 2.07 cm. Dimensionless index 0.66. There is a 23 mm Edwards Sapien prosthetic, stented (TAVR) valve present in the aortic position. Procedure Date: 01/29/2020. Echo findings are consistent with normal structure and function of the aortic valve prosthesis. Pulmonic Valve: The pulmonic valve was grossly normal. Pulmonic valve regurgitation is not visualized. Aorta: The aortic root/ascending aorta has been repaired/replaced. Venous: The inferior vena cava is dilated in size with greater than 50% respiratory variability, suggesting right atrial pressure of 8 mmHg. IAS/Shunts: No atrial level shunt detected by color flow Doppler.  LEFT VENTRICLE PLAX 2D LVIDd:         5.10 cm      Diastology LVIDs:         3.90 cm      LV e' lateral:   6.00 cm/s LV PW:         0.90 cm      LV E/e' lateral: 14.1 LV IVS:        0.90 cm      LV e' medial:    5.27 cm/s LVOT diam:     2.00 cm      LV E/e' medial:  16.0 LV SV:         99 LV SV Index:   57 LVOT Area:     3.14 cm  LV Volumes (MOD) LV vol d, MOD A2C: 152.0 ml LV vol d, MOD A4C: 123.0 ml LV vol s, MOD A2C: 112.0 ml LV vol s, MOD A4C: 75.7 ml LV SV MOD A2C:     40.0 ml LV SV MOD A4C:     123.0 ml LV SV MOD BP:      45.7 ml RIGHT VENTRICLE RV S prime:     5.98 cm/s TAPSE (M-mode): 1.5 cm LEFT ATRIUM              Index       RIGHT ATRIUM           Index LA diam:        4.50 cm 2.59 cm/m  RA Area:     19.40 cm LA Vol (A2C):   42.9 ml 24.67 ml/m RA Volume:   57.50 ml  33.06 ml/m LA Vol (A4C):   57.1 ml 32.83 ml/m LA Biplane Vol: 50.9 ml 29.27 ml/m  AORTIC VALVE AV Area (Vmax):    2.11 cm AV Area (Vmean):   2.25 cm AV Area (VTI):     2.07 cm AV Vmax:           210.00 cm/s AV Vmean:          141.000 cm/s AV VTI:            0.478 m AV Peak Grad:      17.6 mmHg AV Mean Grad:      9.5 mmHg LVOT Vmax:         141.00 cm/s LVOT Vmean:        101.000 cm/s LVOT VTI:          0.315 m LVOT/AV VTI ratio: 0.66 MITRAL  VALVE               TRICUSPID VALVE MV Area (PHT): 3.21 cm    TR Peak grad:   46.0 mmHg MV Decel Time: 236 msec    TR Vmax:        339.00 cm/s MV E velocity: 84.40 cm/s MV A velocity: 71.10 cm/s  SHUNTS MV E/A ratio:  1.19        Systemic VTI:  0.32 m                            Systemic Diam: 2.00 cm Sanda Klein MD Electronically signed by Sanda Klein MD Signature Date/Time: 01/30/2020/8:30:38 PM    Final    Structural Heart Procedure  Result Date: 01/29/2020 See surgical note for result.   Cardiac Studies   CARDIOTHORACIC SURGERY OPERATIVE NOTE  Date of Procedure:01/29/2020  Preoperative Diagnosis:Severe Aortic Stenosis   Postoperative Diagnosis:Same   Procedure:   Transcatheter Aortic Valve Replacement - Transapical Approach Edwards Sapien 3THV (size 45mm, model # 9600TFX, serial [id number])  Co-Surgeons:Bryan Alveria Apley, MD and Sherren Mocha, MD   Anesthesiologist:Carswell Glennon Mac, MD  Echocardiographer:M. Croitoru, MD  Pre-operative Echo Findings: ? severe aortic stenosis ? Normalleft ventricular systolic function   Post-operative Echo Findings: ? Trivialparavalvular leak ? Normalleft ventricular systolic function  _____________   Echo  01/30/20 IMPRESSIONS 1. Left ventricular ejection fraction, by estimation, is 45 to 50%. The  left ventricle has mildly decreased function. The left ventricle has no  regional wall motion abnormalities. There is mild concentric left  ventricular hypertrophy. Left ventricular diastolic parameters are consistent with Grade II diastolic dysfunction (pseudonormalization). Elevated left atrial pressure.  2. Right ventricular systolic function is mildly reduced. The right  ventricular size is normal. There is moderately elevated pulmonary artery systolic pressure. The estimated right ventricular systolic pressure is  0000000 mmHg.  3. Left atrial size was moderately dilated.  4. Right atrial size was moderately dilated.  5. The mitral valve is normal in structure and function. Mild mitral  valve regurgitation.  6. Tricuspid valve regurgitation is mild to moderate.  7. The aortic valve has been repaired/replaced. Aortic valve  regurgitation is not visualized. There is a 23 mm Edwards Sapien  prosthetic (TAVR) valve present in the aortic position. Procedure Date: 01/29/2020. Echo findings are consistent with normal  structure and function of the aortic valve prosthesis. Aortic valve area,  by VTI measures 2.07 cm. Aortic valve mean gradient measures 9.5 mmHg. Aortic valve Vmax measures 2.10 m/s. Dimensionless index 0.66.  8. Aortic root/ascending aorta has been repaired/replaced.  9. The inferior vena cava is dilated in size with >50% respiratory  variability, suggesting right atrial pressure of 8 mmHg.   Patient Profile   [name] is a 74 y.o. female with a history of type B aortic dissection in 2003--> managed conservatively, Type A dissection s/p emergency surgical intervention in 2008 by E[facility] (aorta was replaced with a 28 mm graft from the level of the sinuses to the innominate artery and the aortic valve was resuspended), sinus node dysfunction s/p PPM, chronic combined S/D CHF,  device re programing--> syncope in7/2020 2/2 to sustainedmonomorphic VT s/p ICD, CKD stage III, DMT2, anemia, HTN, HLD, GERD, obesity with recent intentional 60 lb weight loss and LFLG severe AS who presented to [facility] on 01/29/20 for planned TAVR.  Assessment & Plan    Severe AS:s/p successful TAVR with a 23 mm Edwards Sapien  3 Ultra THV via the TA approach on 01/29/20. Post operative echo showed EF 45-50%, normally functioning TAVR with a mean gradient of 9.46mm Hg and no PVL. Chest tube removed 01/30/20. CXR with small apical PTX that is now resolved. Groin site and inframammary sites are stable. ECG with paced rhythm. Started on Asprin and Plavix. Feeling weak. Will get PT consult. Hopeful DC home tomorrow.   Acute on chronic diastolic CHF: as evidenced by an elevated BNP on pre admission lab work and mild cardiomegaly/ pleural effusions on CXR. This has been treated with TAVR. Creat a little elevated this AM, will resume home lasix tomorrow.   CKD stage IIIb: creat a little more elevated today around 1.6. Continue to monitor  HTN: BPs have been well controlled   DMT2: continue on SSI.   Depression: called by pharmacy who decreased dose of Celexa (60mg  -- >20mg  daily) given prolonged QTc. Will have her follow up with her PCP after discharge.     Incidental findings: pre TAVR CT showed a right adrenal nodule stable in size since 06/28/2019 CT, probably a benign adenoma. Follow-up CT abdomen without and with IV contrast in 12 months suggested.   Signed, Angelena Form, PA-C  01/31/2020, 10:12 AM  Pager [phone]  Patient seen, examined. Available data reviewed. Agree with findings, assessment, and plan as outlined by Nell Range, PA-C.  The patient is independently interviewed and examined.  She is sitting up at the bedside about to walk with physical therapy.  Lungs are clear but somewhat diminished in the bases, JVP is normal, heart is regular rate and rhythm with a 2/6 ejection murmur at  the right upper sternal border with no diastolic murmur.  Extremities have no edema.  The patient continues to have some weakness and physical limitation from discomfort related to her chest incision.  Physical therapy will evaluate her today.  Hopefully she will be ready for discharge home tomorrow.  Overall progressing well.  Sherren Mocha, M.D. 01/31/2020 10:48 AM

## 2020-02-01 ENCOUNTER — Other Ambulatory Visit: Payer: Self-pay | Admitting: Physician Assistant

## 2020-02-01 DIAGNOSIS — I5043 Acute on chronic combined systolic (congestive) and diastolic (congestive) heart failure: Secondary | ICD-10-CM

## 2020-02-01 LAB — CBC
HCT: 29.5 % — ABNORMAL LOW (ref 36.0–46.0)
Hemoglobin: 9.6 g/dL — ABNORMAL LOW (ref 12.0–15.0)
MCH: 30.2 pg (ref 26.0–34.0)
MCHC: 32.5 g/dL (ref 30.0–36.0)
MCV: 92.8 fL (ref 80.0–100.0)
Platelets: 92 10*3/uL — ABNORMAL LOW (ref 150–400)
RBC: 3.18 MIL/uL — ABNORMAL LOW (ref 3.87–5.11)
RDW: 15.9 % — ABNORMAL HIGH (ref 11.5–15.5)
WBC: 4 10*3/uL (ref 4.0–10.5)
nRBC: 0 % (ref 0.0–0.2)

## 2020-02-01 LAB — BASIC METABOLIC PANEL
Anion gap: 8 (ref 5–15)
BUN: 45 mg/dL — ABNORMAL HIGH (ref 8–23)
CO2: 20 mmol/L — ABNORMAL LOW (ref 22–32)
Calcium: 8.6 mg/dL — ABNORMAL LOW (ref 8.9–10.3)
Chloride: 106 mmol/L (ref 98–111)
Creatinine, Ser: 1.5 mg/dL — ABNORMAL HIGH (ref 0.44–1.00)
GFR calc Af Amer: 40 mL/min — ABNORMAL LOW (ref >60)
GFR calc non Af Amer: 34 mL/min — ABNORMAL LOW (ref >60)
Glucose, Bld: 95 mg/dL (ref 70–99)
Potassium: 3.9 mmol/L (ref 3.5–5.1)
Sodium: 134 mmol/L — ABNORMAL LOW (ref 135–145)

## 2020-02-01 LAB — GLUCOSE, CAPILLARY
Glucose-Capillary: 106 mg/dL — ABNORMAL HIGH (ref 70–99)
Glucose-Capillary: 96 mg/dL (ref 70–99)

## 2020-02-01 MED ORDER — CARVEDILOL 25 MG PO TABS
25.0000 mg | ORAL_TABLET | Freq: Two times a day (BID) | ORAL | Status: DC
  Administered 2020-02-01: 25 mg via ORAL
  Filled 2020-02-01: qty 1

## 2020-02-01 MED ORDER — ASPIRIN 81 MG PO CHEW: 81.0000 mg | CHEWABLE_TABLET | Freq: Every day | ORAL | 3 refills | Status: DC

## 2020-02-01 MED ORDER — ALPRAZOLAM 0.5 MG PO TABS: 0.5000 mg | ORAL_TABLET | Freq: Three times a day (TID) | ORAL | 0 refills | Status: DC | PRN

## 2020-02-01 MED ORDER — CLOPIDOGREL BISULFATE 75 MG PO TABS: 75.0000 mg | ORAL_TABLET | Freq: Every day | ORAL | 1 refills | Status: DC

## 2020-02-01 MED ORDER — CITALOPRAM HYDROBROMIDE 20 MG PO TABS: 20.0000 mg | ORAL_TABLET | Freq: Every day | ORAL | 9 refills | Status: DC

## 2020-02-01 MED ORDER — OXYCODONE-ACETAMINOPHEN 5-325 MG PO TABS: 1.0000 | ORAL_TABLET | ORAL | 0 refills | Status: DC | PRN

## 2020-02-01 MED FILL — Magnesium Sulfate Inj 50%: INTRAMUSCULAR | Qty: 10 | Status: AC

## 2020-02-01 MED FILL — Potassium Chloride Inj 2 mEq/ML: INTRAVENOUS | Qty: 40 | Status: AC

## 2020-02-01 MED FILL — Heparin Sodium (Porcine) Inj 1000 Unit/ML: INTRAMUSCULAR | Qty: 30 | Status: AC

## 2020-02-01 NOTE — Progress Notes (Signed)
D/C instructions given to pt. Medications and wound care reviewed. All questions answered. IV removed, clean and intact. Friend to escort pt home.  Clyde Canterbury, RN

## 2020-02-01 NOTE — Progress Notes (Signed)
CARDIAC REHAB PHASE I   PRE:  Rate/Rhythm: 70 paced   BP:  Supine:   Sitting: 134/50  Standing:    SaO2: 98%RA  MODE:  Ambulation: 140 ft   POST:  Rate/Rhythm: 78 paced   BP:  Supine:   Sitting: 146/47  Standing:    SaO2: 93%RA [id number] Pt requiring little assistance at this time. Gave a little assistance to get pt to standing. Pt then walked 140 ft on RA with walker. Pt encouraged to take bigger steps.  Sats good on RA. To bathroom and then to bed with alarm on. Pt stated she would like to have a shower chair and BSC. Gave heart healthy diet. Encouraged her to walk farther every day. Pt attended CRP in 2008 and does not feel that she could tolerate program at this time so will not refer.   Graylon Good, RN BSN  02/01/2020 9:50 AM

## 2020-02-01 NOTE — Progress Notes (Signed)
MOBILITY TEAM - Progress Note   02/01/20 1300  Mobility  Activity Ambulated in hall  Level of Assistance Standby assist, set-up cues, supervision of patient - no hands on  Assistive Device Front wheel walker  Distance Ambulated (ft) 160 ft  Mobility Response Tolerated well  Bed Position  (seated edge of bed)   Mobility tolerance improving. Patient preparing for d/c home this afternoon.  Mabeline Caras, PT, DPT Mobility Team Pager [phone]

## 2020-02-01 NOTE — TOC Transition Note (Signed)
Transition of Care [facility]) - CM/SW Discharge Note   Patient Details  Name: [name] MRN: [id number] Date of Birth: Feb 19, 1946  Transition of Care [facility]) CM/SW Contact:  Curlene Labrum, RN Phone Number: 02/01/2020, 1:38 PM   Clinical Narrative:     Pt is a 74 y/o female s/p TAVR. PMH includes HTN, CHF, a fib, DM, CKD, and s/p ICD. She is widowed and has family visiting to the home including a daughter and cousin.  The patient had a friend in the room during the assessment.  Patient currently has a Rollator walker from her deceased husband.  Called [employer] and a 3:1 was ordered to be delivered to the room prior to discharge.    Patient offered Medicare choice for home health services and [facility] and [facility] ([facility]).  [facility] was unable to provide services,  [facility] was chosen and approved for PT/RN after speaking with Tiffany at [place].    Final next level of care: [place] Barriers to Discharge: No Barriers Identified   Patient Goals and CMS Choice Patient states their goals for this hospitalization and ongoing recovery are:: My goal is to get better to I can spend time at [facility] this summer. CMS Medicare.gov Compare Post Acute Care list provided to:: Patient Choice offered to / list presented to : Patient  Discharge Placement                       Discharge Plan and Services   Discharge Planning Services: CM Consult Post Acute Care Choice: Durable Medical Equipment, Home Health          DME Arranged: 3-N-1 DME Agency: [facility] (formerly [facility]) Date DME Agency Contacted: 02/01/20 Time DME Agency Contacted: 91 Representative spoke with at DME Agency: [place]: PT, RN [place] Agency: [facility] (formerly [facility])        Social Determinants of Health ([place]) Interventions     Readmission Risk Interventions Readmission Risk  Prevention Plan 02/01/2020  Transportation Screening Complete  PCP or Specialist Appt within 5-7 Days Complete  Home Care Screening Complete  Medication Review (RN CM) Complete  Some recent data might be hidden

## 2020-02-02 LAB — TYPE AND SCREEN
ABO/RH(D): O NEG
Antibody Screen: NEGATIVE
Unit division: 0
Unit division: 0
Unit division: 0
Unit division: 0

## 2020-02-02 LAB — BPAM RBC
Blood Product Expiration Date: 202103242359
Blood Product Expiration Date: 202103272359
Blood Product Expiration Date: 202103272359
Blood Product Expiration Date: 202103302359
ISSUE DATE / TIME: 202103020702
ISSUE DATE / TIME: 202103020702
ISSUE DATE / TIME: 202103020702
ISSUE DATE / TIME: 202103020702
Unit Type and Rh: 9500
Unit Type and Rh: 9500
Unit Type and Rh: 9500
Unit Type and Rh: 9500

## 2020-02-03 DIAGNOSIS — D631 Anemia in chronic kidney disease: Secondary | ICD-10-CM | POA: Diagnosis not present

## 2020-02-03 DIAGNOSIS — I5043 Acute on chronic combined systolic (congestive) and diastolic (congestive) heart failure: Secondary | ICD-10-CM | POA: Diagnosis not present

## 2020-02-03 DIAGNOSIS — N1832 Chronic kidney disease, stage 3b: Secondary | ICD-10-CM | POA: Diagnosis not present

## 2020-02-03 DIAGNOSIS — E1122 Type 2 diabetes mellitus with diabetic chronic kidney disease: Secondary | ICD-10-CM | POA: Diagnosis not present

## 2020-02-03 DIAGNOSIS — I7101 Dissection of thoracic aorta: Secondary | ICD-10-CM | POA: Diagnosis not present

## 2020-02-03 DIAGNOSIS — I4891 Unspecified atrial fibrillation: Secondary | ICD-10-CM | POA: Diagnosis not present

## 2020-02-03 DIAGNOSIS — Z48812 Encounter for surgical aftercare following surgery on the circulatory system: Secondary | ICD-10-CM | POA: Diagnosis not present

## 2020-02-03 DIAGNOSIS — I130 Hypertensive heart and chronic kidney disease with heart failure and stage 1 through stage 4 chronic kidney disease, or unspecified chronic kidney disease: Secondary | ICD-10-CM | POA: Diagnosis not present

## 2020-02-03 DIAGNOSIS — I4892 Unspecified atrial flutter: Secondary | ICD-10-CM | POA: Diagnosis not present

## 2020-02-04 ENCOUNTER — Telehealth: Payer: Self-pay | Admitting: Physician Assistant

## 2020-02-04 ENCOUNTER — Telehealth: Payer: Self-pay | Admitting: Cardiovascular Disease

## 2020-02-04 MED FILL — Heparin Sodium (Porcine) Inj 1000 Unit/ML: INTRAMUSCULAR | Qty: 30 | Status: AC

## 2020-02-04 NOTE — Telephone Encounter (Signed)
New Message  [name] with [name] is calling to request order for patient:  Skilled nurse services twice a week for two weeks. Once a week for three.

## 2020-02-04 NOTE — Telephone Encounter (Signed)
  [place] VALVE TEAM   Patient contacted regarding discharge from [facility] on 02/01/20  Patient understands to follow up with provider Nell Range on 02/07/20 at [facility]  Patient understands discharge instructions? yes Patient understands medications and regimen? yes Patient understands to bring all medications to this visit? yes  Janitza Overby PA-C  [facility]

## 2020-02-05 NOTE — Telephone Encounter (Signed)
Yes, please.

## 2020-02-06 ENCOUNTER — Ambulatory Visit: Payer: Medicare HMO | Admitting: Physician Assistant

## 2020-02-06 NOTE — Telephone Encounter (Signed)
Left a message for the nurse to call back. 

## 2020-02-06 NOTE — Progress Notes (Signed)
HE[facility] AND [place]                                       Cardiology Office Note    Date:  02/07/2020   ID:  [name], DOB 1946-02-11, MRN [id number]  PCP:  Carollee Herter, Alferd Apa, DO  Cardiologist:  Sanda Klein, MD / Dr. Burt Knack & Dr. Cyndia Bent (TAVR)  CC: [facility] s/p TAVR  History of Present Illness:  [name] is a 74 y.o. female with a history of type B aortic dissection in 2003--> managed conservatively, Type A dissection s/p emergency surgical intervention in 2008 by EBG (aorta was replaced with a 28 mm graft from the level of the sinuses to the innominate artery and the aortic valve was resuspended), sinus node dysfunction s/p PPM, chronic combined S/D CHF, device re programing--> syncope in7/2020 2/2 to sustainedmonomorphic VT s/p ICD, CKD stage III, DMT2, anemia, HTN, HLD, GERD, obesity with recent intentional 60 lb weight loss and LFLG severe AS s/p TAVR (01/29/20) who presents to clinic for follow up.   She has a history of progressive exertional fatigue and shortness of breath over the past 6 to 12 months which continues to worsen. Her most recent echocardiogram on 12/19/2019 shows reduction in her ejection fraction to 40 to 45% which was felt to be due to RV apical pacing. She has moderate concentric left ventricular hypertrophy. The left ventricular internal cavity is moderately dilated. The aortic valve has an indeterminate number of cusps with severe thickening and calcification. There is mild aortic insufficiency. The mean aortic valve gradient is 26.5 mmHg with an aortic valve area of 0.63 cm. Dimensionless index of 0.2 suggesting a severe, low flow, low gradient aortic stenosis. L/RHC showed widely patent coronary arteries. The CTA of the chest, abdomen, and pelvis showed a chronic type A andB dissection with persistent false lumen involving the aortic arch beyond the suture line just proximal to the innominate artery and  extending throughout the entire aortic arch and descending thoracic aorta.  The patient was evaluated by the multidisciplinary valve team and underwent successful TAVR with a69mm Edwards Sapien 3 UltraTHV via the TAapproach on 01/29/20. Post operative echoshowed EF 45-50%, normally functioning TAVR with a mean gradient of 9.67mm Hg and no PVL.Discharged with [facility] PT on aspirin and plavix.   Today she presents to clinic for follow up. She is doing great. Here with her daughter who said she can tell a big difference in her energy and stamina. No CP or SOB. No LE edema, orthopnea or PND. No dizziness or syncope. No blood in stool or urine. No palpitations. Still having pain at incision site but improving everyday. Just needs a few more pain pills.    Past Medical History:  Diagnosis Date  . Acute thoracic aortic dissection [facility]) 2008   emergency surgery - [place]  . AICD (automatic cardioverter/defibrillator) present   . Anxiety   . Cancer ([place])    skin  . CKD (chronic kidney disease) stage 3, GFR 30-59 ml/min   . Diabetes mellitus   . Diverticulosis   . GERD (gastroesophageal reflux disease)   . Hemorrhoids   . History of kidney stones   . Hyperlipidemia   . Hypertension   . Kidney stones   . S/P TAVR (transcatheter aortic valve replacement) 01/29/2020   s/p TAVR with a 23 mm  Edwards Sapien 3 Ultra via the TA approach wtih Dr. Burt Knack and Dr. Cyndia Bent  . Severe aortic stenosis   . Sinus node dysfunction ([facility])    hx of PPM  . Sustained ventricular tachycardia ([place])    s/p ICD 05/2019    Past Surgical History:  Procedure Laterality Date  . ABDOMINAL HYSTERECTOMY  1984  . APPENDECTOMY    . EYE SURGERY  2014   Rght eye  . ICD IMPLANT N/A 06/28/2019   Procedure: PPM upgrade to ICD;  Surgeon: Evans Lance, MD;  Location: [place] CV LAB;  Service: Cardiovascular;  Laterality: N/A;  . PACEMAKER GENERATOR CHANGE  2014   Medtronic adapta  . PACEMAKER GENERATOR CHANGE N/A 08/29/2013    Procedure: PACEMAKER GENERATOR CHANGE;  Surgeon: Sanda Klein, MD;  Location: [place] CATH LAB;  Service: Cardiovascular;  Laterality: N/A;  . PACEMAKER INSERTION  08/2006   dual chamber Medtronic EnRhythm; r/t sinus node dysfunction   . REPAIR OF ACUTE ASCENDING THORACIC AORTIC DISSECTION  2008   Dr. Servando Snare  . RIGHT/LEFT HEART CATH AND CORONARY ANGIOGRAPHY N/A 01/09/2020   Procedure: RIGHT/LEFT HEART CATH AND CORONARY ANGIOGRAPHY;  Surgeon: Sherren Mocha, MD;  Location: [place] CV LAB;  Service: Cardiovascular;  Laterality: N/A;  . TEE WITHOUT CARDIOVERSION N/A 01/29/2020   Procedure: TRANSESOPHAGEAL ECHOCARDIOGRAM (TEE);  Surgeon: Sherren Mocha, MD;  Location: [place];  Service: Open Heart Surgery;  Laterality: N/A;  . THORACIC AORTIC ANEURYSM REPAIR  2000   type III  . TRANSCATHETER AORTIC VALVE REPLACEMENT, TRANSAPICAL N/A 01/29/2020   Procedure: TRANSCATHETER AORTIC VALVE REPLACEMENT, TRANSAPICAL using a Edwards Sapien 3, 64mm  Transcatheter Heart Valve;  Surgeon: Sherren Mocha, MD;  Location: [place];  Service: Open Heart Surgery;  Laterality: N/A;  . TRANSTHORACIC ECHOCARDIOGRAM  09/20/2012   EF=>55% with mild conc LVH; LA mod dilated; RA mildly dilated; mild MR/TR/AR    Current Medications: Outpatient Medications Prior to Visit  Medication Sig Dispense Refill  . ALPRAZolam (XANAX) 0.5 MG tablet Take 1 tablet (0.5 mg total) by mouth 3 (three) times daily as needed for anxiety. 30 tablet 0  . Ascorbic Acid (VITAMIN C) 1000 MG tablet Take 1,000 mg by mouth daily.    [name] aspirin 81 MG chewable tablet Chew 1 tablet (81 mg total) by mouth daily. 90 tablet 3  . carvedilol (COREG) 25 MG tablet TAKE 1 TABLET BY MOUTH TWICE A DAY WITH A MEAL 180 tablet 1  . citalopram (CELEXA) 20 MG tablet Take 1 tablet (20 mg total) by mouth daily. 270 tablet 9  . clopidogrel (PLAVIX) 75 MG tablet Take 1 tablet (75 mg total) by mouth daily with breakfast. 90 tablet 1  . ferrous sulfate 325 (65 FE) MG tablet  Take 325 mg by mouth daily with breakfast.    . fluticasone (FLONASE) 50 MCG/ACT nasal spray USE 2 SPRAYS IN EACH NOSTIRL EVERY DAY (Patient taking differently: Place 2 sprays into both nostrils daily as needed for allergies. ) 16 g 1  . furosemide (LASIX) 20 MG tablet Take 1 tablet (20 mg total) by mouth daily. Okay to take 1 extra tablet as needed for swelling. 90 tablet 3  . hyoscyamine (LEVSIN SL) 0.125 MG SL tablet TAKE 1 TABLET (0.125 MG TOTAL) BY MOUTH EVERY 6 (SIX) HOURS AS NEEDED. 60 tablet 1  . JARDIANCE 10 MG TABS tablet TAKE 1 TABLET(10 MG) BY MOUTH DAILY. 30 tablet 3  . loperamide (IMODIUM) 2 MG capsule Take 2 mg by mouth as needed  for diarrhea or loose stools.    [name] losartan (COZAAR) 25 MG tablet Take 0.5 tablets (12.5 mg total) by mouth at bedtime. 90 tablet 3  . Omega-3 Fatty Acids (FISH OIL) 1000 MG CAPS Take 1,000 mg by mouth daily.     Vladimir Faster Glycol-Propyl Glycol (SYSTANE OP) Place 1 drop into both eyes daily as needed (Dry eyes).     . Potassium Chloride ER 20 MEQ TBCR TAKE 1 TABLET BY MOUTH EVERY DAY 90 tablet 4  . Probiotic Product (PRO-BIOTIC BLEND PO) Take 1 tablet by mouth daily.     . rosuvastatin (CRESTOR) 20 MG tablet TAKE 1 TABLET BY MOUTH EVERY DAY 90 tablet 1  . oxyCODONE-acetaminophen (PERCOCET) 5-325 MG tablet Take 1 tablet by mouth every 4 (four) hours as needed for severe pain. 15 tablet 0   Facility-Administered Medications Prior to Visit  Medication Dose Route Frequency Provider Last Rate Last Admin  . diclofenac sodium (VOLTAREN) 1 % transdermal gel 2 g  2 g Topical QID Carollee Herter, Yvonne R, DO         Allergies:   Oxycodone, Vicodin [hydrocodone-acetaminophen], Latex, Neomycin-bacitracin zn-polymyx, and Sulfonamide derivatives   Social History   Socioeconomic History  . Marital status: Widowed    Spouse name: Not on file  . Number of children: 2  . Years of education: Not on file  . Highest education level: Not on file  Occupational History     Employer: RETIRED  Tobacco Use  . Smoking status: Former Smoker    Quit date: 09/04/1993    Years since quitting: 26.4  . Smokeless tobacco: Never Used  Substance and Sexual Activity  . Alcohol use: No  . Drug use: No  . Sexual activity: Not Currently  Other Topics Concern  . Not on file  Social History Narrative   Lives at home and husband was just moved to [place]   Social Determinants of Health   Financial Resource Strain:   . Difficulty of Paying Living Expenses:   Food Insecurity:   . Worried About Charity fundraiser in the Last Year:   . Arboriculturist in the Last Year:   Transportation Needs:   . Film/video editor (Medical):   [name] Lack of Transportation (Non-Medical):   Physical Activity:   . Days of Exercise per Week:   . Minutes of Exercise per Session:   Stress:   . Feeling of Stress :   Social Connections:   . Frequency of Communication with Friends and Family:   . Frequency of Social Gatherings with Friends and Family:   . Attends Religious Services:   . Active Member of Clubs or Organizations:   . Attends Archivist Meetings:   [name] Marital Status:      Family History:  The patient's family history includes Heart attack in her father and maternal grandmother; Kidney disease in her mother; Valvular heart disease in her son.     ROS:   Please see the history of present illness.    ROS All other systems reviewed and are negative.   PHYSICAL EXAM:   VS:  BP (!) 146/80   Pulse 69   Ht 5' (1.524 m)   Wt 162 lb (73.5 kg)   SpO2 94%   BMI 31.64 kg/m    GEN: Well nourished, well developed, in no acute distress, obese HEENT: normal Neck: no JVD or masses Cardiac: RRR; soft flow murmur. No, rubs, or gallops. Trace pedal  edema bilaterally  Respiratory:  clear to auscultation bilaterally, normal work of breathing GI: soft, nontender, nondistended, + BS MS: no deformity or atrophy Skin: warm and dry, no rash.   Groin site/ inframammary site  clear without hematoma or ecchymosis. Chest tube suture removed.  Neuro:  Alert and Oriented x 3, Strength and sensation are intact Psych: euthymic mood, full affect   Wt Readings from Last 3 Encounters:  02/07/20 162 lb (73.5 kg)  02/01/20 168 lb 6.9 oz (76.4 kg)  01/25/20 166 lb 1.6 oz (75.3 kg)      Studies/Labs Reviewed:   EKG:  EKG is NOT ordered today.    Recent Labs: 06/28/2019: TSH 0.893 01/25/2020: ALT 15; B Natriuretic Peptide 765.0 01/30/2020: Magnesium 1.7 02/01/2020: BUN 45; Creatinine, Ser 1.50; Hemoglobin 9.6; Platelets 92; Potassium 3.9; Sodium 134   Lipid Panel    Component Value Date/Time   CHOL 143 06/28/2017 1446   TRIG 107.0 06/28/2017 1446   HDL 51.40 06/28/2017 1446   CHOLHDL 3 06/28/2017 1446   VLDL 21.4 06/28/2017 1446   LDLCALC 70 06/28/2017 1446   LDLDIRECT 217.7 09/13/2011 1535    Additional studies/ records that were reviewed today include:  CARDIOTHORACIC SURGERY OPERATIVE NOTE  Date of Procedure:01/29/2020  Preoperative Diagnosis:Severe Aortic Stenosis   Postoperative Diagnosis:Same   Procedure:   Transcatheter Aortic Valve Replacement - Transapical Approach Edwards Sapien 3THV (size 39mm, model # 9600TFX, serial [id number])  Co-Surgeons:Bryan Alveria Apley, MD and Sherren Mocha, MD   Anesthesiologist:Carswell Glennon Mac, MD  Echocardiographer:M. Croitoru, MD  Pre-operative Echo Findings: ? severe aortic stenosis ? Normalleft ventricular systolic function   Post-operative Echo Findings: ? Trivialparavalvular leak ? Normalleft ventricular systolic function  _____________   Echo 01/30/20 IMPRESSIONS 1. Left ventricular ejection fraction, by estimation, is 45 to 50%. The  left ventricle has mildly decreased function. The left ventricle has no  regional wall motion abnormalities. There is mild concentric  left  ventricular hypertrophy. Left ventricular diastolic parameters are consistent with Grade II diastolic dysfunction (pseudonormalization). Elevated left atrial pressure.  2. Right ventricular systolic function is mildly reduced. The right  ventricular size is normal. There is moderately elevated pulmonary artery systolic pressure. The estimated right ventricular systolic pressure is  0000000 mmHg.  3. Left atrial size was moderately dilated.  4. Right atrial size was moderately dilated.  5. The mitral valve is normal in structure and function. Mild mitral  valve regurgitation.  6. Tricuspid valve regurgitation is mild to moderate.  7. The aortic valve has been repaired/replaced. Aortic valve  regurgitation is not visualized. There is a 23 mm Edwards Sapien  prosthetic (TAVR) valve present in the aortic position. Procedure Date: 01/29/2020. Echo findings are consistent with normal  structure and function of the aortic valve prosthesis. Aortic valve area,  by VTI measures 2.07 cm. Aortic valve mean gradient measures 9.5 mmHg. Aortic valve Vmax measures 2.10 m/s. Dimensionless index 0.66.  8. Aortic root/ascending aorta has been repaired/replaced.  9. The inferior vena cava is dilated in size with >50% respiratory  variability, suggesting right atrial pressure of 8 mmHg.    ASSESSMENT & PLAN:   Severe AS s/p TAVR:doing excellent. She has had a big improvement in her breathing and stamina since TAVR. Groin site and intermammary site healing well. Chest tube suture removed. She understands the ongoing need for SBE prophylaxis; amoxicillin called into pharmacy. I will see her back early April for 1 month follow up.   Chronic diastolic CHF: appears euvolemic. Continue on lasix  20mg  daily.   HTN: BP initially elevated, but improved to 120/70 on my personal recheck. Continue same meds.    Adrenal nodule: pre TAVR CT showed a right adrenal nodule stable in size since 06/28/2019 CT,  probably a benign adenoma. Follow-up CT abdomen without and with IV contrast in 12 months suggested. Will discuss at 1 month visit.    Medication Adjustments/Labs and Tests Ordered: Current medicines are reviewed at length with the patient today.  Concerns regarding medicines are outlined above.  Medication changes, Labs and Tests ordered today are listed in the Patient Instructions below. Patient Instructions  Medication Instructions:  Your provider recommends that you continue on your current medications as directed. Please refer to the Current Medication list given to you today.     Follow-Up: Please keep your follow-up appointments!    Signed, Ima Lapiana, PA-C  02/07/2020 2:13 PM    [facility] [address], [place], [place]  [id number] Phone: [phone]; Fax: [phone]

## 2020-02-06 NOTE — Telephone Encounter (Signed)
[name] has been made aware and verbalized her understanding.

## 2020-02-07 ENCOUNTER — Other Ambulatory Visit: Payer: Self-pay

## 2020-02-07 ENCOUNTER — Encounter: Payer: Self-pay | Admitting: Physician Assistant

## 2020-02-07 ENCOUNTER — Ambulatory Visit: Payer: Medicare HMO | Admitting: Physician Assistant

## 2020-02-07 ENCOUNTER — Other Ambulatory Visit: Payer: Self-pay | Admitting: Physician Assistant

## 2020-02-07 VITALS — BP 146/80 | HR 69 | Ht 60.0 in | Wt 162.0 lb

## 2020-02-07 DIAGNOSIS — E278 Other specified disorders of adrenal gland: Secondary | ICD-10-CM

## 2020-02-07 DIAGNOSIS — Z952 Presence of prosthetic heart valve: Secondary | ICD-10-CM

## 2020-02-07 DIAGNOSIS — I10 Essential (primary) hypertension: Secondary | ICD-10-CM | POA: Diagnosis not present

## 2020-02-07 DIAGNOSIS — I5032 Chronic diastolic (congestive) heart failure: Secondary | ICD-10-CM

## 2020-02-07 MED ORDER — AMOXICILLIN 500 MG PO TABS: 2000.0000 mg | ORAL_TABLET | ORAL | 12 refills | Status: DC

## 2020-02-07 MED ORDER — OXYCODONE-ACETAMINOPHEN 5-325 MG PO TABS: 1.0000 | ORAL_TABLET | ORAL | 0 refills | Status: DC | PRN

## 2020-02-07 NOTE — Progress Notes (Signed)
Great news.  Thank you, Joellen [place]

## 2020-02-07 NOTE — Patient Instructions (Signed)
Medication Instructions:  Your provider recommends that you continue on your current medications as directed. Please refer to the Current Medication list given to you today.     Follow-Up: Please keep your follow-up appointments!

## 2020-02-08 ENCOUNTER — Telehealth: Payer: Self-pay | Admitting: Cardiovascular Disease

## 2020-02-08 NOTE — Telephone Encounter (Signed)
   [facility] Pre-operative Risk Assessment    Request for surgical clearance:  1. What type of surgery is being performed? Cataract surgery   2. When is this surgery scheduled? 03/06/20   3. What type of clearance is required (medical clearance vs. Pharmacy clearance to hold med vs. Both)? Medical   4. Are there any medications that need to be held prior to surgery and how long? No   5. Practice name and name of physician performing surgery? [facility], Dr. [name]   6. What is your office phone number [phone]   7.   What is your office fax number [phone]  8.   Anesthesia type (None, local, MAC, general) ? [place] 02/08/2020, 4:36 PM  _________________________________________________________________   (provider comments below)

## 2020-02-11 NOTE — Telephone Encounter (Signed)
   Primary Cardiologist: Sanda Klein, MD  Chart reviewed as part of pre-operative protocol coverage. Cataract extractions are recognized in guidelines as low risk surgeries that do not typically require specific preoperative testing or holding of blood thinner therapy. Therefore, given past medical history and time since last visit, based on ACC/AHA guidelines, [name] would be at acceptable risk for the planned procedure without further cardiovascular testing.   I will route this recommendation to the requesting party via Epic fax function and remove from pre-op pool.  Please call with questions.  [place], [place] 02/11/2020, 9:27 AM

## 2020-02-15 ENCOUNTER — Other Ambulatory Visit: Payer: Self-pay | Admitting: Physician Assistant

## 2020-02-15 NOTE — Progress Notes (Signed)
  [facility]   MULTIDISCIPLINARY HEART VALVE TEAM  Patient called office about getting more pain meds. I told her to try using tylenol and call me back if pain is not controlled.   Angelena Form PA-C  [facility]

## 2020-02-18 ENCOUNTER — Encounter: Payer: Medicare HMO | Admitting: Internal Medicine

## 2020-02-25 ENCOUNTER — Ambulatory Visit: Payer: Medicare HMO | Admitting: Internal Medicine

## 2020-02-25 ENCOUNTER — Telehealth: Payer: Self-pay | Admitting: Physician Assistant

## 2020-02-25 ENCOUNTER — Encounter: Payer: Self-pay | Admitting: Internal Medicine

## 2020-02-25 ENCOUNTER — Other Ambulatory Visit: Payer: Self-pay

## 2020-02-25 VITALS — BP 126/72 | HR 76 | Ht 60.0 in | Wt 156.4 lb

## 2020-02-25 DIAGNOSIS — I5032 Chronic diastolic (congestive) heart failure: Secondary | ICD-10-CM | POA: Diagnosis not present

## 2020-02-25 DIAGNOSIS — I472 Ventricular tachycardia, unspecified: Secondary | ICD-10-CM

## 2020-02-25 DIAGNOSIS — Z952 Presence of prosthetic heart valve: Secondary | ICD-10-CM

## 2020-02-25 NOTE — Telephone Encounter (Signed)
I moved her to 4/7 @ 2:30pm. Do you mind calling her back and letting her know. She has an echo that day at 12;50 that it looks like someone already r/s'd. Tell her I am sorry she will have to wait just a little bit in between echo and OV bc I already have a 2pm booked for that day. Thank you! KT

## 2020-02-25 NOTE — Telephone Encounter (Signed)
  [facility]   MULTIDISCIPLINARY HEART VALVE TEAM  Pt called into office to move her appointment. She is going out of town to be with her family at [employer]. She cannot be seen after echo 4/7 bc her ride has a dentist apt. She is having cataract surgery on 4/8. Will plan for virtual visit on 4/14  Vida Collman PA-C  [facility]

## 2020-02-25 NOTE — Telephone Encounter (Signed)
Pt has been made aware that her appt with Thresea Lathrop has been changed to 03/05/2020 at 2:30. Pt was grateful for the call back.

## 2020-02-25 NOTE — Telephone Encounter (Signed)
Patient states that her appt with Angelena Form on 02/28/20 was to be changed. She will be out of town on 02/28/20.

## 2020-02-25 NOTE — Patient Instructions (Addendum)
Medication Instructions:  Your physician recommends that you continue on your current medications as directed. Please refer to the Current Medication list given to you today.  Labwork: None ordered.  Testing/Procedures: None ordered.  Follow-Up:  Your physician wants you to follow-up in: one year with Dr. Lovena Le.   You will receive a reminder letter in the mail two months in advance. If you don't receive a letter, please call our office to schedule the follow-up appointment.  Remote monitoring is used to monitor your ICD from home. This monitoring reduces the number of office visits required to check your device to one time per year. It allows [name] to keep an eye on the functioning of your device to ensure it is working properly. You are scheduled for a device check from home on 05/26/2020. You may send your transmission at any time that day. If you have a wireless device, the transmission will be sent automatically. After your physician reviews your transmission, you will receive a postcard with your next transmission date.     Any Other Special Instructions Will Be Listed Below (If Applicable).  If you need a refill on your cardiac medications before your next appointment, please call your pharmacy.

## 2020-02-25 NOTE — Progress Notes (Signed)
HPI [name] returns today for followup. She is a pleasant 74 yo woman with remote aortic dissection, s/p aorta and AV replacement in 2008, VT, s/p ICD, and recent worsening AS, s/p transapical TAVR. She has LVH and mild LV dysfunction as her AV conduction has worsened. She is pacing all of the time but feels well since her TAVR. No ICD therapies.  Allergies  Allergen Reactions  . Oxycodone Itching  . Vicodin [Hydrocodone-Acetaminophen] Itching  . Latex Itching and Rash  . Neomycin-Bacitracin Zn-Polymyx Other (See Comments)    Red , swollen eye  . Sulfonamide Derivatives Other (See Comments)    Just had reaction when taking during pregnancy- last child birth 64     Current Outpatient Medications  Medication Sig Dispense Refill  . ALPRAZolam (XANAX) 0.5 MG tablet Take 1 tablet (0.5 mg total) by mouth 3 (three) times daily as needed for anxiety. 30 tablet 0  . amoxicillin (AMOXIL) 500 MG tablet Take 4 tablets (2,000 mg total) by mouth as directed. 1 hour prior to dental work including cleanings 12 tablet 12  . Ascorbic Acid (VITAMIN C) 1000 MG tablet Take 1,000 mg by mouth daily.    [name] aspirin 81 MG chewable tablet Chew 1 tablet (81 mg total) by mouth daily. 90 tablet 3  . carvedilol (COREG) 25 MG tablet TAKE 1 TABLET BY MOUTH TWICE A DAY WITH A MEAL 180 tablet 1  . citalopram (CELEXA) 20 MG tablet Take 1 tablet (20 mg total) by mouth daily. 270 tablet 9  . clopidogrel (PLAVIX) 75 MG tablet Take 1 tablet (75 mg total) by mouth daily with breakfast. 90 tablet 1  . ferrous sulfate 325 (65 FE) MG tablet Take 325 mg by mouth daily with breakfast.    . fluticasone (FLONASE) 50 MCG/ACT nasal spray USE 2 SPRAYS IN EACH NOSTIRL EVERY DAY (Patient taking differently: Place 2 sprays into both nostrils daily as needed for allergies. ) 16 g 1  . furosemide (LASIX) 20 MG tablet Take 1 tablet (20 mg total) by mouth daily. Okay to take 1 extra tablet as needed for swelling. 90 tablet 3  .  hyoscyamine (LEVSIN SL) 0.125 MG SL tablet TAKE 1 TABLET (0.125 MG TOTAL) BY MOUTH EVERY 6 (SIX) HOURS AS NEEDED. 60 tablet 1  . JARDIANCE 10 MG TABS tablet TAKE 1 TABLET(10 MG) BY MOUTH DAILY. 30 tablet 3  . loperamide (IMODIUM) 2 MG capsule Take 2 mg by mouth as needed for diarrhea or loose stools.    . Omega-3 Fatty Acids (FISH OIL) 1000 MG CAPS Take 1,000 mg by mouth daily.     [name] oxyCODONE-acetaminophen (PERCOCET) 5-325 MG tablet Take 1 tablet by mouth every 4 (four) hours as needed for severe pain. 15 tablet 0  . Polyethyl Glycol-Propyl Glycol (SYSTANE OP) Place 1 drop into both eyes daily as needed (Dry eyes).     . Potassium Chloride ER 20 MEQ TBCR TAKE 1 TABLET BY MOUTH EVERY DAY 90 tablet 4  . Probiotic Product (PRO-BIOTIC BLEND PO) Take 1 tablet by mouth daily.     . rosuvastatin (CRESTOR) 20 MG tablet TAKE 1 TABLET BY MOUTH EVERY DAY 90 tablet 1  . losartan (COZAAR) 25 MG tablet Take 0.5 tablets (12.5 mg total) by mouth at bedtime. 90 tablet 3   Current Facility-Administered Medications  Medication Dose Route Frequency Provider Last Rate Last Admin  . diclofenac sodium (VOLTAREN) 1 % transdermal gel 2 g  2 g Topical QID Lowne  Koren Shiver, DO         Past Medical History:  Diagnosis Date  . Acute thoracic aortic dissection [facility]) 2008   emergency surgery - [place]  . AICD (automatic cardioverter/defibrillator) present   . Anxiety   . Cancer ([place])    skin  . CKD (chronic kidney disease) stage 3, GFR 30-59 ml/min   . Diabetes mellitus   . Diverticulosis   . GERD (gastroesophageal reflux disease)   . Hemorrhoids   . History of kidney stones   . Hyperlipidemia   . Hypertension   . Kidney stones   . S/P TAVR (transcatheter aortic valve replacement) 01/29/2020   s/p TAVR with a 23 mm Edwards Sapien 3 Ultra via the TA approach wtih Dr. Burt Knack and Dr. Cyndia Bent  . Severe aortic stenosis   . Sinus node dysfunction ([facility])    hx of PPM  . Sustained ventricular tachycardia ([place])     s/p ICD 05/2019    ROS:   All systems reviewed and negative except as noted in the HPI.   Past Surgical History:  Procedure Laterality Date  . ABDOMINAL HYSTERECTOMY  1984  . APPENDECTOMY    . EYE SURGERY  2014   Rght eye  . ICD IMPLANT N/A 06/28/2019   Procedure: PPM upgrade to ICD;  Surgeon: Evans Lance, MD;  Location: [facility];  Service: Cardiovascular;  Laterality: N/A;  . PACEMAKER GENERATOR CHANGE  2014   Medtronic adapta  . PACEMAKER GENERATOR CHANGE N/A 08/29/2013   Procedure: PACEMAKER GENERATOR CHANGE;  Surgeon: Sanda Klein, MD;  Location: [place];  Service: Cardiovascular;  Laterality: N/A;  . PACEMAKER INSERTION  08/2006   dual chamber Medtronic EnRhythm; r/t sinus node dysfunction   . REPAIR OF ACUTE ASCENDING THORACIC AORTIC DISSECTION  2008   Dr. Servando Snare  . RIGHT/LEFT HEART CATH AND CORONARY ANGIOGRAPHY N/A 01/09/2020   Procedure: RIGHT/LEFT HEART CATH AND CORONARY ANGIOGRAPHY;  Surgeon: Sherren Mocha, MD;  Location: [facility];  Service: Cardiovascular;  Laterality: N/A;  . TEE WITHOUT CARDIOVERSION N/A 01/29/2020   Procedure: TRANSESOPHAGEAL ECHOCARDIOGRAM (TEE);  Surgeon: Sherren Mocha, MD;  Location: [facility];  Service: Open Heart Surgery;  Laterality: N/A;  . THORACIC AORTIC ANEURYSM REPAIR  2000   type III  . TRANSCATHETER AORTIC VALVE REPLACEMENT, TRANSAPICAL N/A 01/29/2020   Procedure: TRANSCATHETER AORTIC VALVE REPLACEMENT, TRANSAPICAL using a Edwards Sapien 3, 68mm  Transcatheter Heart Valve;  Surgeon: Sherren Mocha, MD;  Location: [facility];  Service: Open Heart Surgery;  Laterality: N/A;  . TRANSTHORACIC ECHOCARDIOGRAM  09/20/2012   EF=>55% with mild conc LVH; LA mod dilated; RA mildly dilated; mild MR/TR/AR     Family History  Problem Relation Age of Onset  . Kidney disease Mother   . Heart attack Father   . Heart attack Maternal Grandmother   . Valvular heart disease Son        valve replacement at 96  . Cancer Neg Hx       Social History   Socioeconomic History  . Marital status: Widowed    Spouse name: Not on file  . Number of children: 2  . Years of education: Not on file  . Highest education level: Not on file  Occupational History    Employer: RETIRED  Tobacco Use  . Smoking status: Former Smoker    Quit date: 09/04/1993    Years since quitting: 26.4  . Smokeless tobacco: Never Used  Substance and Sexual Activity  .  Alcohol use: No  . Drug use: No  . Sexual activity: Not Currently  Other Topics Concern  . Not on file  Social History Narrative   Lives at home and husband was just moved to [place]   Social Determinants of Health   Financial Resource Strain:   . Difficulty of Paying Living Expenses:   Food Insecurity:   . Worried About Charity fundraiser in the Last Year:   . Arboriculturist in the Last Year:   Transportation Needs:   . Film/video editor (Medical):   [name] Lack of Transportation (Non-Medical):   Physical Activity:   . Days of Exercise per Week:   . Minutes of Exercise per Session:   Stress:   . Feeling of Stress :   Social Connections:   . Frequency of Communication with Friends and Family:   . Frequency of Social Gatherings with Friends and Family:   . Attends Religious Services:   . Active Member of Clubs or Organizations:   . Attends Archivist Meetings:   [name] Marital Status:   Intimate Partner Violence:   . Fear of Current or Ex-Partner:   . Emotionally Abused:   [name] Physically Abused:   . Sexually Abused:      BP 126/72   Pulse 76   Ht 5' (1.524 m)   Wt 156 lb 6.4 oz (70.9 kg)   SpO2 97%   BMI 30.54 kg/m   Physical Exam:  Stable appearing NAD HEENT: Unremarkable Neck:  No JVD, no thyromegally Lymphatics:  No adenopathy Back:  No CVA tenderness Lungs:  Clear with no wheezes HEART:  Regular rate rhythm, 2/6 systolic murmur, no rubs, no clicks Abd:  soft, positive bowel sounds, no organomegally, no rebound, no guarding Ext:   2 plus pulses, no edema, no cyanosis, no clubbing Skin:  No rashes no nodules Neuro:  CN II through XII intact, motor grossly intact  DEVICE  Normal device function.  See PaceArt for details.   Assess/Plan: 1. VT - she has had no recurrent VT. She will continue her current meds. 2. AS - she is s/p TAVR and her 2D echo showed minimal residual gradient. 3. ICD - her medtronic DDD ICD is working normally. 4. CHB - she has no/minimal AV conduction today and is pacing at a long AV delay. We tightened her delay up today for more physiologic AV delays.  Cristopher [place], MD

## 2020-02-26 ENCOUNTER — Telehealth: Payer: Self-pay | Admitting: Cardiovascular Disease

## 2020-02-26 LAB — CUP PACEART INCLINIC DEVICE CHECK
Battery Remaining Longevity: 96 mo
Battery Voltage: 3.01 V
Brady Statistic AP VP Percent: 90.95 %
Brady Statistic AP VS Percent: 3.85 %
Brady Statistic AS VP Percent: 4.01 %
Brady Statistic AS VS Percent: 1.19 %
Brady Statistic RA Percent Paced: 94.7 %
Brady Statistic RV Percent Paced: 94.95 %
Date Time Interrogation Session: 20210329101210
HighPow Impedance: 56 Ohm
Implantable Lead Implant Date: 20071016
Implantable Lead Implant Date: 20071016
Implantable Lead Implant Date: 20200730
Implantable Lead Location: 753859
Implantable Lead Location: 753860
Implantable Lead Location: 753860
Implantable Lead Model: 4092
Implantable Lead Model: 5594
Implantable Lead Model: 6935
Implantable Pulse Generator Implant Date: 20200730
Lead Channel Impedance Value: 342 Ohm
Lead Channel Impedance Value: 399 Ohm
Lead Channel Impedance Value: 494 Ohm
Lead Channel Pacing Threshold Amplitude: 0.625 V
Lead Channel Pacing Threshold Amplitude: 0.75 V
Lead Channel Pacing Threshold Pulse Width: 0.4 ms
Lead Channel Pacing Threshold Pulse Width: 0.4 ms
Lead Channel Sensing Intrinsic Amplitude: 16.375 mV
Lead Channel Sensing Intrinsic Amplitude: 2.375 mV
Lead Channel Sensing Intrinsic Amplitude: 2.375 mV
Lead Channel Sensing Intrinsic Amplitude: 20.75 mV
Lead Channel Setting Pacing Amplitude: 1.75 V
Lead Channel Setting Pacing Amplitude: 2.5 V
Lead Channel Setting Pacing Pulse Width: 0.4 ms
Lead Channel Setting Sensing Sensitivity: 0.3 mV

## 2020-02-26 NOTE — Telephone Encounter (Signed)
° °  [facility] Pre-operative Risk Assessment    Request for surgical clearance:  1. What type of surgery is being performed? Cataract   2. When is this surgery scheduled? 03/06/20  3. What type of clearance is required (medical clearance vs. Pharmacy clearance to hold med vs. Both)? Medical   4. Are there any medications that need to be held prior to surgery and how long? No   5. Practice name and name of physician performing surgery? [facility], Dr. Satira Sark   6. What is your office phone number [phone]   7.   What is your office fax number [phone]  8.   Anesthesia type (None, local, MAC, general) ? Topical IV Sedation   Trilby Drummer 02/26/2020, 3:35 PM  _________________________________________________________________   (provider comments below)

## 2020-02-26 NOTE — Telephone Encounter (Signed)
   Primary Cardiologist: Sanda Klein, MD  Chart reviewed as part of pre-operative protocol coverage. Given past medical history and time since last visit, based on ACC/AHA guidelines, [name] would be at acceptable risk for the planned procedure without further cardiovascular testing. She does not need to hold Plavix for cataract surgery as this is a minimal risk for bleeding.   I will route this recommendation to the requesting party via Epic fax function and remove from pre-op pool.  Please call with questions.  Phill Myron. Jenessa Gillingham DNP, ANP, AACC  02/26/2020, 3:50 PM

## 2020-02-28 ENCOUNTER — Ambulatory Visit: Payer: Medicare HMO | Admitting: Physician Assistant

## 2020-02-28 ENCOUNTER — Other Ambulatory Visit (HOSPITAL_COMMUNITY): Payer: Medicare HMO

## 2020-03-03 ENCOUNTER — Other Ambulatory Visit: Payer: Self-pay | Admitting: Family Medicine

## 2020-03-03 DIAGNOSIS — F419 Anxiety disorder, unspecified: Secondary | ICD-10-CM

## 2020-03-04 ENCOUNTER — Other Ambulatory Visit: Payer: Self-pay

## 2020-03-04 DIAGNOSIS — F419 Anxiety disorder, unspecified: Secondary | ICD-10-CM

## 2020-03-04 MED ORDER — ALPRAZOLAM 0.5 MG PO TABS: ORAL_TABLET | ORAL | 0 refills | Status: DC

## 2020-03-04 NOTE — Telephone Encounter (Signed)
Requesting:xanax Contract:yes UDS:n/a Last OV:08/30/19 Next OV:n/a Last Refill:02/01/20  #[id number] Database:   Please advise

## 2020-03-04 NOTE — Telephone Encounter (Signed)
Requesting: Xanax Contract: 10/27/2017 UDS: 10/27/2017 Last OV: 08/30/19 w/ Paz Next OV: N/A Last Refill: 03/04/20, #90--0 RF Database:   Please advise

## 2020-03-04 NOTE — Telephone Encounter (Signed)
Patient called in to see if DR. Lowne could send in a prescription for  ALPRAZolam [name] 0.5 MG tablet [id number]    Please send it to [id number] [address], [address]  [place], [place] [address]  Phone:  [phone] Fax:  [phone]  DEA #:  [id number]

## 2020-03-05 ENCOUNTER — Ambulatory Visit: Payer: Medicare HMO | Admitting: Physician Assistant

## 2020-03-05 ENCOUNTER — Ambulatory Visit (HOSPITAL_COMMUNITY): Payer: Medicare HMO | Attending: Cardiology

## 2020-03-05 ENCOUNTER — Other Ambulatory Visit: Payer: Self-pay

## 2020-03-05 DIAGNOSIS — Z952 Presence of prosthetic heart valve: Secondary | ICD-10-CM | POA: Insufficient documentation

## 2020-03-05 MED ORDER — PERFLUTREN LIPID MICROSPHERE
1.0000 mL | INTRAVENOUS | Status: AC | PRN
  Administered 2020-03-05: 1 mL via INTRAVENOUS

## 2020-03-06 DIAGNOSIS — H2512 Age-related nuclear cataract, left eye: Secondary | ICD-10-CM | POA: Diagnosis not present

## 2020-03-06 DIAGNOSIS — H25012 Cortical age-related cataract, left eye: Secondary | ICD-10-CM | POA: Diagnosis not present

## 2020-03-09 NOTE — Progress Notes (Signed)
[facility]   MULTIDISCIPLINARY HEART VALVE TEAM   Virtual Visit via Telephone Note   This visit type was conducted due to national recommendations for restrictions regarding the COVID-19 Pandemic (e.g. social distancing) in an effort to limit this patient's exposure and mitigate transmission in our community.  Due to her co-morbid illnesses, this patient is at least at moderate risk for complications without adequate follow up.  This format is felt to be most appropriate for this patient at this time.  The patient did not have access to video technology/had technical difficulties with video requiring transitioning to audio format only (telephone).  All issues noted in this document were discussed and addressed.  No physical exam could be performed with this format.  Please refer to the patient's chart for her  consent to telehealth for [facility].   Evaluation Performed:  Follow-up visit  Date:  03/12/2020   ID:  [name], DOB Feb 12, 1946, MRN [id number]  Patient Location: Home Provider Location: Office  PCP:  Carollee Herter, Alferd Apa, DO  Cardiologist:  Sanda Klein, MD / Dr. Burt Knack & Dr. Cyndia Bent (TAVR) EP: Dr. Lovena Le  Chief Complaint:  1 month s/p TAVR  History of Present Illness:    CHASSY NORED is a 74 y.o. female with a history of type B aortic dissection in 2003-->managed conservatively, Type Adissections/p emergency surgical intervention in 2008 by EBG (aorta was replaced with a 28 mm graft from the level of the sinuses to the innominate artery and the aortic valve was resuspended), sinus node dysfunction s/p PPM, chronic combined S/D CHF, device re programing-->syncope in7/2020 2/2 to sustainedmonomorphic VT s/p ICD, CKD stage III, DMT2, anemia, HTN, HLD, GERD, obesity with recent intentional 60 lb weight loss and LFLG severe AS s/p TAVR (01/29/20) who presents to clinic for follow up.   She has a history of progressive exertional fatigue and shortness  of breath over the past 6 to 12 months which continues to worsen. Her most recent echocardiogram on 12/19/2019 shows reduction in her ejection fraction to 40 to 45% which was felt to be due to RV apical pacing. She has moderate concentric left ventricular hypertrophy. The left ventricular internal cavity is moderately dilated. The aortic valve has an indeterminate number of cusps with severe thickening and calcification. There is mild aortic insufficiency. The mean aortic valve gradient is 26.5 mmHg with an aortic valve area of 0.63 cm. Dimensionless index of 0.2 suggesting a severe, low flow, low gradient aortic stenosis.L/RHC showed widely patent coronary arteries.The CTA of the chest, abdomen, and pelvis showeda chronic type A andB dissection with persistent false lumen involving the aortic arch beyond the suture line just proximal to the innominate artery and extending throughout the entire aortic arch and descending thoracic aorta.  The patient was evaluated by the multidisciplinary valve team and underwent successful TAVR with a47mm Edwards Sapien 3 UltraTHV via the TAapproach on 01/29/20. Post operative echoshowed EF 45-50%, normally functioning TAVR with a mean gradient of 9.74mm Hg and no PVL.Discharged with [facility] and on aspirin and plavix.   Today she presents for follow up. Continues to feel better and better. No more pain. Has lost more weight. Down another 10 lbs. She just got back from the beach and her daughter couldn't keep up with her because of her increased energy. She feels like she has her life back and she is just so pleased with her surgery. Has only been taking lasix just PRN. No LE edema, orthopnea  or PND. No chest pain. Sometimes has a weird feeling at the bottom of her through that feels like a lightning bolt that lasts second.   Past Medical History:  Diagnosis Date   Acute thoracic aortic dissection [facility]) 2008   emergency surgery - [place]   AICD (automatic  cardioverter/defibrillator) present    Anxiety    Cancer ([place])    skin   CKD (chronic kidney disease) stage 3, GFR 30-59 ml/min    Diabetes mellitus    Diverticulosis    GERD (gastroesophageal reflux disease)    Hemorrhoids    History of kidney stones    Hyperlipidemia    Hypertension    Kidney stones    S/P TAVR (transcatheter aortic valve replacement) 01/29/2020   s/p TAVR with a 23 mm Edwards Sapien 3 Ultra via the TA approach wtih Dr. Burt Knack and Dr. Cyndia Bent   Severe aortic stenosis    Sinus node dysfunction (H[facility])    hx of PPM   Sustained ventricular tachycardia ([place])    s/p ICD 05/2019   Past Surgical History:  Procedure Laterality Date   ABDOMINAL HYSTERECTOMY  1984   APPENDECTOMY     EYE SURGERY  2014   Rght eye   ICD IMPLANT N/A 06/28/2019   Procedure: PPM upgrade to ICD;  Surgeon: Evans Lance, MD;  Location: [facility] [facility];  Service: Cardiovascular;  Laterality: N/A;   PACEMAKER GENERATOR CHANGE  2014   Medtronic adapta   PACEMAKER GENERATOR CHANGE N/A 08/29/2013   Procedure: PACEMAKER GENERATOR CHANGE;  Surgeon: Sanda Klein, MD;  Location: [facility] CATH [facility];  Service: Cardiovascular;  Laterality: N/A;   PACEMAKER INSERTION  08/2006   dual chamber Medtronic EnRhythm; r/t sinus node dysfunction    REPAIR OF ACUTE ASCENDING THORACIC AORTIC DISSECTION  2008   Dr. Servando Snare   RIGHT/LEFT HEART CATH AND CORONARY ANGIOGRAPHY N/A 01/09/2020   Procedure: RIGHT/LEFT HEART CATH AND CORONARY ANGIOGRAPHY;  Surgeon: Sherren Mocha, MD;  Location: [facility];  Service: Cardiovascular;  Laterality: N/A;   TEE WITHOUT CARDIOVERSION N/A 01/29/2020   Procedure: TRANSESOPHAGEAL ECHOCARDIOGRAM (TEE);  Surgeon: Sherren Mocha, MD;  Location: [place];  Service: Open Heart Surgery;  Laterality: N/A;   THORACIC AORTIC ANEURYSM REPAIR  2000   type III   TRANSCATHETER AORTIC VALVE REPLACEMENT, TRANSAPICAL N/A 01/29/2020   Procedure: TRANSCATHETER AORTIC VALVE  REPLACEMENT, TRANSAPICAL using a Edwards Sapien 3, 69mm  Transcatheter Heart Valve;  Surgeon: Sherren Mocha, MD;  Location: [place] [facility];  Service: Open Heart Surgery;  Laterality: N/A;   TRANSTHORACIC ECHOCARDIOGRAM  09/20/2012   EF=>55% with mild conc LVH; LA mod dilated; RA mildly dilated; mild MR/TR/AR     Current Meds  Medication Sig   ALPRAZolam (XANAX) 0.5 MG tablet TAKE 1 TABLET BY MOUTH THREE TIMES A DAY AS NEEDED FOR ANXIETY   amoxicillin (AMOXIL) 500 MG tablet Take 4 tablets (2,000 mg total) by mouth as directed. 1 hour prior to dental work including cleanings   Ascorbic Acid (VITAMIN C) 1000 MG tablet Take 1,000 mg by mouth daily.   aspirin 81 MG chewable tablet Chew 1 tablet (81 mg total) by mouth daily.   carvedilol (COREG) 25 MG tablet TAKE 1 TABLET BY MOUTH TWICE A DAY WITH A MEAL   citalopram (CELEXA) 20 MG tablet Take 1 tablet (20 mg total) by mouth daily.   clopidogrel (PLAVIX) 75 MG tablet Take 1 tablet (75 mg total) by mouth daily with breakfast.   ferrous sulfate 325 (65  FE) MG tablet Take 325 mg by mouth daily with breakfast.   fluticasone (FLONASE) 50 MCG/ACT nasal spray USE 2 SPRAYS IN EACH NOSTIRL EVERY DAY (Patient taking differently: Place 2 sprays into both nostrils daily as needed for allergies. )   furosemide (LASIX) 20 MG tablet TAKE 1 TABLET (20 MG TOTAL) BY MOUTH DAILY. OKAY TO TAKE 1 EXTRA TABLET AS NEEDED FOR SWELLING.   hyoscyamine (LEVSIN SL) 0.125 MG SL tablet TAKE 1 TABLET (0.125 MG TOTAL) BY MOUTH EVERY 6 (SIX) HOURS AS NEEDED.   JARDIANCE 10 MG TABS tablet TAKE 1 TABLET(10 MG) BY MOUTH DAILY.   loperamide (IMODIUM) 2 MG capsule Take 2 mg by mouth as needed for diarrhea or loose stools.   moxifloxacin (VIGAMOX) 0.5 % ophthalmic solution    Omega-3 Fatty Acids (FISH OIL) 1000 MG CAPS Take 1,000 mg by mouth daily.    oxyCODONE-acetaminophen (PERCOCET) 5-325 MG tablet Take 1 tablet by mouth every 4 (four) hours as needed for severe pain.    Polyethyl Glycol-Propyl Glycol (SYSTANE OP) Place 1 drop into both eyes daily as needed (Dry eyes).    Potassium Chloride ER 20 MEQ TBCR TAKE 1 TABLET BY MOUTH EVERY DAY   prednisoLONE acetate (PRED FORTE) 1 % ophthalmic suspension    Probiotic Product (PRO-BIOTIC BLEND PO) Take 1 tablet by mouth daily.    rosuvastatin (CRESTOR) 20 MG tablet TAKE 1 TABLET BY MOUTH EVERY DAY   Current Facility-Administered Medications for the 03/12/20 encounter (Telemedicine) with Eileen Stanford, PA-C  Medication   diclofenac sodium (VOLTAREN) 1 % transdermal gel 2 g     Allergies:   Oxycodone, Vicodin [hydrocodone-acetaminophen], Latex, Neomycin-bacitracin zn-polymyx, and Sulfonamide derivatives   Social History   Tobacco Use   Smoking status: Former Smoker    Quit date: 09/04/1993    Years since quitting: 26.5   Smokeless tobacco: Never Used  Substance Use Topics   Alcohol use: No   Drug use: No     Family Hx: The patient's family history includes Heart attack in her father and maternal grandmother; Kidney disease in her mother; Valvular heart disease in her son. There is no history of Cancer.  ROS:   Please see the history of present illness.    All other systems reviewed and are negative.   Prior CV studies:   The following studies were reviewed today:  CARDIOTHORACIC SURGERY OPERATIVE NOTE  Date of Procedure:01/29/2020  Preoperative Diagnosis:Severe Aortic Stenosis   Postoperative Diagnosis:Same   Procedure:   Transcatheter Aortic Valve Replacement - Transapical Approach Edwards Sapien 3THV (size 35mm, model # 9600TFX, serial [id number])  Co-Surgeons:Bryan Alveria Apley, MD and Sherren Mocha, MD   Anesthesiologist:Carswell Glennon Mac, MD  Echocardiographer:M. Croitoru, MD  Pre-operative Echo Findings: ? severe aortic stenosis ? Normalleft  ventricular systolic function   Post-operative Echo Findings: ? Trivialparavalvular leak ? Normalleft ventricular systolic function  _____________   Echo 01/30/20 IMPRESSIONS 1. Left ventricular ejection fraction, by estimation, is 45 to 50%. The  left ventricle has mildly decreased function. The left ventricle has no  regional wall motion abnormalities. There is mild concentric left  ventricular hypertrophy. Left ventricular diastolic parameters are consistent with Grade II diastolic dysfunction (pseudonormalization). Elevated left atrial pressure.  2. Right ventricular systolic function is mildly reduced. The right  ventricular size is normal. There is moderately elevated pulmonary artery systolic pressure. The estimated right ventricular systolic pressure is  0000000 mmHg.  3. Left atrial size was moderately dilated.  4. Right atrial size was moderately  dilated.  5. The mitral valve is normal in structure and function. Mild mitral  valve regurgitation.  6. Tricuspid valve regurgitation is mild to moderate.  7. The aortic valve has been repaired/replaced. Aortic valve  regurgitation is not visualized. There is a 23 mm Edwards Sapien  prosthetic (TAVR) valve present in the aortic position. Procedure Date: 01/29/2020. Echo findings are consistent with normal  structure and function of the aortic valve prosthesis. Aortic valve area,  by VTI measures 2.07 cm. Aortic valve mean gradient measures 9.5 mmHg. Aortic valve Vmax measures 2.10 m/s. Dimensionless index 0.66.  8. Aortic root/ascending aorta has been repaired/replaced.  9. The inferior vena cava is dilated in size with >50% respiratory  variability, suggesting right atrial pressure of 8 mmHg.  ______________  Echo 03/06/20  IMPRESSIONS  1. Stable post TAVR findings, unchanged and normal mean transaortic gradient 16 mmHg, no paravalvular leak.  2. Left ventricular ejection fraction, by estimation, is 45 to 50%. The  left ventricle has mildly decreased function. The left ventricle demonstrates regional wall motion abnormalities (see scoring diagram/findings for description). There is moderate concentric left ventricular hypertrophy. Left ventricular diastolic function could not be evaluated.  3. Right ventricular systolic function is normal. The right ventricular size is normal. There is moderately elevated pulmonary artery systolic pressure. The estimated right ventricular systolic pressure is 99991111 mmHg.  4. Left atrial size was mildly dilated.  5. Right atrial size was mildly dilated.  6. The mitral valve is normal in structure. Trivial mitral valve regurgitation. No evidence of mitral stenosis.  7. The aortic valve has been repaired/replaced. Aortic valve regurgitation is not visualized. No aortic stenosis is present. There is a 23 mm Edwards Sapien prosthetic (TAVR) valve present in the aortic position. Procedure Date: 01/29/20.  8. The inferior vena cava is normal in size with greater than 50% respiratory variability, suggesting right atrial pressure of 3 mmHg.  Comparison(s): 01/30/20 EF 45-50%. AV 1mmHg peak, 81mmHg mean.  Labs/Other Tests and Data Reviewed:    EKG:  No ECG reviewed.  Recent Labs: 06/28/2019: TSH 0.893 01/25/2020: ALT 15; B Natriuretic Peptide 765.0 01/30/2020: Magnesium 1.7 02/01/2020: BUN 45; Creatinine, Ser 1.50; Hemoglobin 9.6; Platelets 92; Potassium 3.9; Sodium 134   Recent Lipid Panel Lab Results  Component Value Date/Time   CHOL 143 06/28/2017 02:46 PM   TRIG 107.0 06/28/2017 02:46 PM   HDL 51.40 06/28/2017 02:46 PM   CHOLHDL 3 06/28/2017 02:46 PM   LDLCALC 70 06/28/2017 02:46 PM   LDLDIRECT 217.7 09/13/2011 03:35 PM    Wt Readings from Last 3 Encounters:  03/12/20 152 lb (68.9 kg)  02/25/20 156 lb 6.4 oz (70.9 kg)  02/07/20 162 lb (73.5 kg)     Objective:    Vital Signs:  Ht 5' (1.524 m)    Wt 152 lb (68.9 kg)    BMI 29.69 kg/m     ASSESSMENT & PLAN:    Severe  AS s/p TAVR:echo 03/05/20 showed EF 45-50%, normally functioning TAVR with a mean gradient of 9 mm Hg and no PVL. She has NYHA class I symptoms and feels markedly improved since TAVR, like she "has her life back." TA site healing well.  She will continue on aspirin and plavix. Plavix can be discontinued after 6 months (07/2020). She understands the need for ongoing sbe prophylaxis; she has amoxicillin. I will see her back in 1 year for follow up and echo.   Chronic combined S/D CHF: EF 45-50% (improved since echo 11/2019 when  it was 40-45%). No s/s CHF. Continue on Coreg 25mg  BID, Losartan 25mg  daily, Jardiance 10mg  daily as well as Lasix 20mg  PRN.   HTN: has not taken BP today but said it's been well controlled.   Adrenal nodule: pre TAVR CT showed a right adrenal nodule stable in size since 06/28/2019 CT, probably a benign adenoma. Follow-up CT abdomen without and with IV contrast in 12 months suggested. Will get this set up at the 1 year visit  VT s/p ICD: followed by Dr. Lovena Le   COVID-19 Education: The signs and symptoms of COVID-19 were discussed with the patient and how to seek care for testing (follow up with PCP or arrange E-visit).  The importance of social distancing was discussed today.  Time:   Today, I have spent 20 minutes with the patient with telehealth technology discussing the above problems.     Medication Adjustments/Labs and Tests Ordered: Current medicines are reviewed at length with the patient today.  Concerns regarding medicines are outlined above.   Tests Ordered: Orders Placed This Encounter  Procedures   ECHOCARDIOGRAM COMPLETE    Medication Changes: No orders of the defined types were placed in this encounter.    Disposition:  Follow up in 1 year(s)   Signed, Leahna Quillman, PA-C  03/12/2020 3:28 PM    [place]

## 2020-03-12 ENCOUNTER — Encounter: Payer: Self-pay | Admitting: Physician Assistant

## 2020-03-12 ENCOUNTER — Other Ambulatory Visit: Payer: Self-pay

## 2020-03-12 ENCOUNTER — Telehealth (INDEPENDENT_AMBULATORY_CARE_PROVIDER_SITE_OTHER): Payer: Medicare HMO | Admitting: Physician Assistant

## 2020-03-12 ENCOUNTER — Other Ambulatory Visit: Payer: Self-pay | Admitting: Cardiovascular Disease

## 2020-03-12 VITALS — Ht 60.0 in | Wt 152.0 lb

## 2020-03-12 DIAGNOSIS — I5042 Chronic combined systolic (congestive) and diastolic (congestive) heart failure: Secondary | ICD-10-CM

## 2020-03-12 DIAGNOSIS — I110 Hypertensive heart disease with heart failure: Secondary | ICD-10-CM

## 2020-03-12 DIAGNOSIS — I10 Essential (primary) hypertension: Secondary | ICD-10-CM

## 2020-03-12 DIAGNOSIS — E278 Other specified disorders of adrenal gland: Secondary | ICD-10-CM | POA: Diagnosis not present

## 2020-03-12 DIAGNOSIS — Z95810 Presence of automatic (implantable) cardiac defibrillator: Secondary | ICD-10-CM | POA: Diagnosis not present

## 2020-03-12 DIAGNOSIS — Z952 Presence of prosthetic heart valve: Secondary | ICD-10-CM

## 2020-03-12 DIAGNOSIS — I472 Ventricular tachycardia, unspecified: Secondary | ICD-10-CM

## 2020-03-12 NOTE — Patient Instructions (Addendum)
Medication Instructions:  1) You may STOP PLAVIX 6 months out from TAVR (this should be about 07/31/2020)  Follow-Up: Your provider recommends that you schedule a follow-up appointment in 3 MONTHS with Dr. Sallyanne Kuster. His schedule for July will be available after 03/29/20. Please call his office to arrange at that time.  You will be called to arrange your 1 year TAVR echo, CT, and office visit.

## 2020-03-20 DIAGNOSIS — H25811 Combined forms of age-related cataract, right eye: Secondary | ICD-10-CM | POA: Diagnosis not present

## 2020-03-20 DIAGNOSIS — H2511 Age-related nuclear cataract, right eye: Secondary | ICD-10-CM | POA: Diagnosis not present

## 2020-03-31 ENCOUNTER — Other Ambulatory Visit: Payer: Self-pay | Admitting: Family Medicine

## 2020-03-31 DIAGNOSIS — F419 Anxiety disorder, unspecified: Secondary | ICD-10-CM

## 2020-03-31 MED ORDER — ALPRAZOLAM 0.5 MG PO TABS: ORAL_TABLET | ORAL | 0 refills | Status: DC

## 2020-03-31 NOTE — Telephone Encounter (Signed)
Requesting: Xanax Contract: 10/27/17 UDS: 10/27/17 Last OV: 08/30/19 w/ Paz Next OV: N/A Last Refill: 03/04/20, #90--0 RF Database:   Please advise

## 2020-03-31 NOTE — Telephone Encounter (Signed)
Medication:ALPRAZolam (XANAX) 0.5 MG tablet  Patient on her last pill today   Has the patient contacted their pharmacy? Yes.   (If no, request that the patient contact the pharmacy for the refill.) (If yes, when and what did the pharmacy advise?)  Preferred Pharmacy (with phone number or street name):  [id number] - [place], [address]  [place], [place] [address]  Phone:  [phone] Fax:  [phone]   Agent: Please be advised that RX refills may take up to 3 business days. We ask that you follow-up with your pharmacy.

## 2020-04-01 ENCOUNTER — Other Ambulatory Visit (HOSPITAL_COMMUNITY): Payer: Self-pay | Admitting: Internal Medicine

## 2020-04-02 NOTE — Telephone Encounter (Signed)
This is Dr. Victorino December pt. The pt does not see Dr. Haroldine Laws. Please address

## 2020-04-18 ENCOUNTER — Other Ambulatory Visit (HOSPITAL_COMMUNITY): Payer: Self-pay | Admitting: Internal Medicine

## 2020-04-18 DIAGNOSIS — I5032 Chronic diastolic (congestive) heart failure: Secondary | ICD-10-CM

## 2020-04-23 ENCOUNTER — Other Ambulatory Visit: Payer: Self-pay

## 2020-04-23 ENCOUNTER — Telehealth (INDEPENDENT_AMBULATORY_CARE_PROVIDER_SITE_OTHER): Payer: Medicare HMO | Admitting: Family Medicine

## 2020-04-23 ENCOUNTER — Encounter: Payer: Self-pay | Admitting: Family Medicine

## 2020-04-23 DIAGNOSIS — F419 Anxiety disorder, unspecified: Secondary | ICD-10-CM | POA: Diagnosis not present

## 2020-04-23 MED ORDER — ALPRAZOLAM 0.5 MG PO TABS: ORAL_TABLET | ORAL | 0 refills | Status: DC

## 2020-04-23 NOTE — Progress Notes (Signed)
Virtual Visit via Video Note  I connected with [name] on 04/23/20 at  8:00 AM EDT by a video enabled telemedicine application and verified that I am speaking with the correct person using two identifiers.  Location: Patient: home  Provider: home    I discussed the limitations of evaluation and management by telemedicine and the availability of in person appointments. The patient expressed understanding and agreed to proceed.  History of Present Illness: Pt is home alone doine well since her aortic valve replacement  She has lost 60 lbs and is no longer diabetic She is very happy  She needed the xanax during covid and valve replacement    Observations/Objective:   Today's Vitals   04/23/20 0756  Weight: 155 lb (70.3 kg)   Body mass index is 30.27 kg/m. Pt is in NAD Assessment and Plan: 1. Anxiety Stable con't xanax prn  Pt is doing much better since aortic valve replacement,   F/u 6 months or sooner prn  - ALPRAZolam (XANAX) 0.5 MG tablet; TAKE 1 TABLET BY MOUTH THREE TIMES A DAY AS NEEDED FOR ANXIETY--Pt needs OV for further refills  Dispense: 90 tablet; Refill: 0   Follow Up Instructions:    I discussed the assessment and treatment plan with the patient. The patient was provided an opportunity to ask questions and all were answered. The patient agreed with the plan and demonstrated an understanding of the instructions.   The patient was advised to call back or seek an in-person evaluation if the symptoms worsen or if the condition fails to improve as anticipated.  I provided 25 minutes of non-face-to-face time during this encounter.--- talking to pt and reviewing med rec    [name], DO

## 2020-04-24 ENCOUNTER — Ambulatory Visit: Payer: Medicare HMO | Admitting: Family Medicine

## 2020-04-24 NOTE — Telephone Encounter (Signed)
Called and spoke to patient about losartan refill and she stated she does not need refill at this time. She gets losartan 25 mg tablets and takes 1/2 tablet once daily and she is over run with losartan tablets right now. She stated she will call when she needs a refill. I explained I would deny refill to pharmacy and document in her chart that she does not need refill at this time. She thanked me for my call.

## 2020-06-19 ENCOUNTER — Telehealth: Payer: Self-pay | Admitting: Physician Assistant

## 2020-06-19 NOTE — Telephone Encounter (Signed)
TY

## 2020-06-19 NOTE — Telephone Encounter (Signed)
I spoke with her. I let her know she is clear to drive and travel. I arranged for a follow up with Dr. Sallyanne Kuster in September. She is feeling great and has lost 60 lbs.

## 2020-06-19 NOTE — Telephone Encounter (Signed)
Pt called and wants the PA to give her a call back regarding a procedure she had in March and she has some questions to ask. Please call to discuss.

## 2020-06-24 ENCOUNTER — Telehealth: Payer: Self-pay | Admitting: Family Medicine

## 2020-06-24 NOTE — Telephone Encounter (Signed)
Left message for patient to call back and schedule Medicare Annual Wellness Visit (AWV) with Nurse Health Advisor   This should be a 65 MINUTE VISIT.  Last AWV 04/22/16

## 2020-06-25 ENCOUNTER — Other Ambulatory Visit: Payer: Self-pay | Admitting: Family Medicine

## 2020-06-25 DIAGNOSIS — F419 Anxiety disorder, unspecified: Secondary | ICD-10-CM

## 2020-06-25 NOTE — Telephone Encounter (Signed)
Medication:  ALPRAZolam (XANAX) 0.5 MG tablet   Has the patient contacted their pharmacy? No. (If no, request that the patient contact the pharmacy for the refill.) (If yes, when and what did the pharmacy advise?)  Preferred Pharmacy (with phone number or street name): [id number] [address]  [place], [place] [address]  Phone:  [phone] Fax:  [phone]  DEA #:  [id number]  Agent: Please be advised that RX refills may take up to 3 business days. We ask that you follow-up with your pharmacy.

## 2020-06-25 NOTE — Telephone Encounter (Signed)
Alprazolam refill.   Last OV: 04/23/2020 Last Fill: 5/26/202 #90 and 0RF Pt sig: 1 tab tid prn UDS: 10/27/2017 Low risk

## 2020-07-11 ENCOUNTER — Telehealth: Payer: Self-pay | Admitting: Physician Assistant

## 2020-07-11 NOTE — Telephone Encounter (Signed)
At this point, it is only recommended for those with immunosuppression (which she is not). But if the CDC expands the recommendation to her entire age group, I would advise that she gets it.

## 2020-07-11 NOTE — Telephone Encounter (Signed)
New message     Patient had TAVR procedure in march.  She want to know if she should get the COVID booster.  She is not sure if she can get it.  She is not having any problems.  She is feeling great, but just wanted to ask about the booster.  Please call at your convenience.

## 2020-07-11 NOTE — Telephone Encounter (Signed)
Spoke with patient who had COVID vaccinations in Feb/March and had TAVR on 01/29/20  She states she is doing 100% better since surgery, lost 62lbs  She would like to know if she should get the COVID booster - news for FDA authorization of this just released yesterday   Will defer to MD

## 2020-07-11 NOTE — Telephone Encounter (Signed)
Patient called w/MD advice

## 2020-07-13 ENCOUNTER — Other Ambulatory Visit: Payer: Self-pay | Admitting: Physician Assistant

## 2020-07-25 ENCOUNTER — Other Ambulatory Visit: Payer: Self-pay | Admitting: Family Medicine

## 2020-07-25 DIAGNOSIS — F419 Anxiety disorder, unspecified: Secondary | ICD-10-CM

## 2020-07-25 NOTE — Telephone Encounter (Signed)
Medication: ALPRAZolam (XANAX) 0.5 MG tablet [[id number]]   Has the patient contacted their pharmacy? No. (If no, request that the patient contact the pharmacy for the refill.) (If yes, when and what did the pharmacy advise?)  Preferred Pharmacy (with phone number or street name[id number] [address]  [place], [place] [address]  Phone:  [phone] Fax:  [phone]  DEA #:  [id number]  Agent: Please be advised that RX refills may take up to 3 business days. We ask that you follow-up with your pharmacy.

## 2020-07-25 NOTE — Telephone Encounter (Signed)
Requesting:Alprazolam Contract: none needs csc UDS: none needs uds Last Visit:08/30/2019 Next Visit: none Last Refill:06/26/2020  Please Advise

## 2020-07-26 MED ORDER — ALPRAZOLAM 0.5 MG PO TABS: 0.5000 mg | ORAL_TABLET | Freq: Three times a day (TID) | ORAL | 0 refills | Status: DC | PRN

## 2020-07-26 NOTE — Telephone Encounter (Signed)
I will refill but she will need to do contract and uds

## 2020-08-08 ENCOUNTER — Other Ambulatory Visit (HOSPITAL_COMMUNITY): Payer: Self-pay | Admitting: Internal Medicine

## 2020-08-14 ENCOUNTER — Other Ambulatory Visit: Payer: Self-pay | Admitting: Cardiovascular Disease

## 2020-08-14 DIAGNOSIS — I10 Essential (primary) hypertension: Secondary | ICD-10-CM

## 2020-08-20 NOTE — Progress Notes (Addendum)
Cardiology Office Note    Date:  08/21/2020   ID:  [name], DOB [birth year], MRN [id number]  PCP:  [name], Anna Apa, DO  Cardiologist:   Anna Klein, MD   Chief Complaint  Patient presents with  . Congestive Heart Failure  . Irregular Heart Beat  . Thoracic Aortic Aneurysm  . Cardiac Valve Problem  . Pacemaker Check    ICD    History of Present Illness:  [name] is a 74 y.o. female with a history of repaired type A aortic dissection (resuspended native aortic valve, persistent intimal flap involving the aortic arch, left subclavian artery and abdominal aorta), subsequent development of severe paradoxical low flow low gradient AS/AI and TAVR with 68mm Edwards Sapien 3 UltraTHV via the TAapproach on 01/29/20, chronic predominantly diastolic heart failure (EF 45%), paroxysmal monomorphic ventricular tachycardia status post ICD implantation August 2020, pre-existing sinus node dysfunction with sinus node arrest, "atrially-pacemaker dependent", atypical LBBB with very long AV conduction time and 100% V pacing, history of morbid obesity, diabetes mellitus, hypertension.  She had normal coronary arteries by CT angiography in August 2020.  Her pacemaker was upgraded to ICD in August 2020. AAIR-DDDR programming was associated with only 20% V pacing, but MVP related pauses were associated with long-short sequences and precipitation of polymorphic VT. Reprogrammed DDDR, she has 100% V pacing and a subsequent slight reduction in LVEF, but without exacerbation of clinical CHF. No recurrent VT. Last saw [name] in March 2021, when her AV delay was tightened to a physiological range. There have been no remote downloads since that office visit.  [name] is generally doing well, has been very successful with diet changes and weight loss. The patient specifically denies any chest pain at rest exertion, dyspnea at rest or with exertion, orthopnea, paroxysmal nocturnal dyspnea, syncope,  palpitations, focal neurological deficits, intermittent claudication, lower extremity edema, unexplained weight gain, cough, hemoptysis or wheezing.  She has not had any serious bleeding problems but does complain of easy and severe bruising.   Interrogation of her defibrillator shows normal device function, with estimated longevity of 7.3 years.  She has virtually 99 % atrial pacing and 99.7% ventricular pacing.  The heart rate histogram distribution appears appropriate.  When pacing was taken down to 30 bpm, there was no underlying atrial or ventricular rhythm.  She had a structural heart team telemedicine visit and a follow up echo in April. LV EF 45-50%, mean TAVR gradient 9 mm Hg, no leak. Moderate PAH at 53 mm Hg.  She is on maximum dose carvedilol as well as Jardiance, losartan and a very low-dose of furosemide.  She is only taking diuretics every 2 or 3 days a week to control lower extremity edema.. She is compliant with statin therapy and her most recent lipid profile showed an excellent LDL cholesterol.  She has been taking alprazolam on the average once or twice daily and does not think she could function without it, but feels that she is doing better with her anxiety.   Past Medical History:  Diagnosis Date  . Acute thoracic aortic dissection [facility]) 2008   emergency surgery - [name]  . AICD (automatic cardioverter/defibrillator) present   . Anxiety   . Cancer ([place])    skin  . CKD (chronic kidney disease) stage 3, GFR 30-59 ml/min   . Diabetes mellitus   . Diverticulosis   . GERD (gastroesophageal reflux disease)   . Hemorrhoids   . History of kidney stones   .  Hyperlipidemia   . Hypertension   . Kidney stones   . S/P TAVR (transcatheter aortic valve replacement) 01/29/2020   s/p TAVR with a 23 mm Edwards Sapien 3 Ultra via the TA approach wtih Dr. Burt [name] and Dr. Cyndia [name]  . Severe aortic stenosis   . Sinus node dysfunction (HCC)    hx of PPM  . Sustained ventricular  tachycardia ([place])    s/p ICD 05/2019    Past Surgical History:  Procedure Laterality Date  . ABDOMINAL HYSTERECTOMY  1984  . APPENDECTOMY    . EYE SURGERY  2014   Rght eye  . ICD IMPLANT N/A 06/28/2019   Procedure: PPM upgrade to ICD;  Surgeon: Anna Lance, MD;  Location: [place] CV LAB;  Service: Cardiovascular;  Laterality: N/A;  . PACEMAKER GENERATOR CHANGE  2014   Medtronic adapta  . PACEMAKER GENERATOR CHANGE N/A 08/29/2013   Procedure: PACEMAKER GENERATOR CHANGE;  Surgeon: Anna Klein, MD;  Location: [place] CATH LAB;  Service: Cardiovascular;  Laterality: N/A;  . PACEMAKER INSERTION  08/2006   dual chamber Medtronic EnRhythm; r/t sinus node dysfunction   . REPAIR OF ACUTE ASCENDING THORACIC AORTIC DISSECTION  2008   Dr. Servando [name]  . RIGHT/LEFT HEART CATH AND CORONARY ANGIOGRAPHY N/A 01/09/2020   Procedure: RIGHT/LEFT HEART CATH AND CORONARY ANGIOGRAPHY;  Surgeon: Anna Mocha, MD;  Location: [place] CV LAB;  Service: Cardiovascular;  Laterality: N/A;  . TEE WITHOUT CARDIOVERSION N/A 01/29/2020   Procedure: TRANSESOPHAGEAL ECHOCARDIOGRAM (TEE);  Surgeon: Anna Mocha, MD;  Location: [place];  Service: Open Heart Surgery;  Laterality: N/A;  . THORACIC AORTIC ANEURYSM REPAIR  2000   type III  . TRANSCATHETER AORTIC VALVE REPLACEMENT, TRANSAPICAL N/A 01/29/2020   Procedure: TRANSCATHETER AORTIC VALVE REPLACEMENT, TRANSAPICAL using a Edwards Sapien 3, 75mm  Transcatheter Heart Valve;  Surgeon: Anna Mocha, MD;  Location: [place];  Service: Open Heart Surgery;  Laterality: N/A;  . TRANSTHORACIC ECHOCARDIOGRAM  09/20/2012   EF=>55% with mild conc LVH; LA mod dilated; RA mildly dilated; mild MR/TR/AR    Current Medications: Outpatient Medications Prior to Visit  Medication Sig Dispense Refill  . ALPRAZolam (XANAX) 0.5 MG tablet Take 1 tablet (0.5 mg total) by mouth 3 (three) times daily as needed for anxiety. 90 tablet 0  . amoxicillin (AMOXIL) 500 MG tablet Take 4 tablets  (2,000 mg total) by mouth as directed. 1 hour prior to dental work including cleanings 12 tablet 12  . Ascorbic Acid (VITAMIN C) 1000 MG tablet Take 1,000 mg by mouth daily.    [name] aspirin 81 MG chewable tablet Chew 1 tablet (81 mg total) by mouth daily. 90 tablet 3  . carvedilol (COREG) 25 MG tablet TAKE 1 TABLET BY MOUTH TWICE A DAY WITH A MEAL 180 tablet 1  . citalopram (CELEXA) 20 MG tablet Take 1 tablet (20 mg total) by mouth daily. 270 tablet 9  . ferrous sulfate 325 (65 FE) MG tablet Take 325 mg by mouth daily with breakfast.    . fluticasone (FLONASE) 50 MCG/ACT nasal spray USE 2 SPRAYS IN EACH NOSTIRL EVERY DAY (Patient taking differently: Place 2 sprays into both nostrils daily as needed for allergies. ) 16 g 1  . furosemide (LASIX) 20 MG tablet TAKE 1 TABLET (20 MG TOTAL) BY MOUTH DAILY. OKAY TO TAKE 1 EXTRA TABLET AS NEEDED FOR SWELLING. 180 tablet 2  . JARDIANCE 10 MG TABS tablet TAKE 1 TABLET(10 MG) BY MOUTH DAILY. 90 tablet 1  . losartan (COZAAR) 25  MG tablet Take 0.5 tablets (12.5 mg total) by mouth at bedtime. 90 tablet 3  . Omega-3 Fatty Acids (FISH OIL) 1000 MG CAPS Take 1,000 mg by mouth daily.     . Potassium Chloride ER 20 MEQ TBCR TAKE 1 TABLET BY MOUTH EVERY DAY 90 tablet 4  . Probiotic Product (PRO-BIOTIC BLEND PO) Take 1 tablet by mouth daily.     . rosuvastatin (CRESTOR) 20 MG tablet TAKE 1 TABLET BY MOUTH EVERY DAY 90 tablet 1  . clopidogrel (PLAVIX) 75 MG tablet TAKE 1 TABLET (75 MG TOTAL) BY MOUTH DAILY WITH BREAKFAST. 90 tablet 2   Facility-Administered Medications Prior to Visit  Medication Dose Route Frequency Provider Last Rate Last Admin  . diclofenac sodium (VOLTAREN) 1 % transdermal gel 2 g  2 g Topical QID [name], Yvonne R, DO         Allergies:   Oxycodone, Vicodin [hydrocodone-acetaminophen], Latex, Neomycin-bacitracin zn-polymyx, and Sulfonamide derivatives   Social History   Socioeconomic History  . Marital status: Widowed    Spouse name: Not on  file  . Number of children: 2  . Years of education: Not on file  . Highest education level: Not on file  Occupational History    Employer: RETIRED  Tobacco Use  . Smoking status: Former Smoker    Quit date: 09/04/1993    Years since quitting: 26.9  . Smokeless tobacco: Never Used  Vaping Use  . Vaping Use: Never used  Substance and Sexual Activity  . Alcohol use: No  . Drug use: No  . Sexual activity: Not Currently  Other Topics Concern  . Not on file  Social History Narrative   Lives at home and husband was just moved to [place]   Social Determinants of Health   Financial Resource Strain:   . Difficulty of Paying Living Expenses: Not on file  Food Insecurity:   . Worried About Charity fundraiser in the Last Year: Not on file  . Ran Out of Food in the Last Year: Not on file  Transportation Needs:   . Lack of Transportation (Medical): Not on file  . Lack of Transportation (Non-Medical): Not on file  Physical Activity:   . Days of Exercise per Week: Not on file  . Minutes of Exercise per Session: Not on file  Stress:   . Feeling of Stress : Not on file  Social Connections:   . Frequency of Communication with Friends and Family: Not on file  . Frequency of Social Gatherings with Friends and Family: Not on file  . Attends Religious Services: Not on file  . Active Member of Clubs or Organizations: Not on file  . Attends Archivist Meetings: Not on file  . Marital Status: Not on file     Family History:  The patient's family history includes Heart attack in her father and maternal grandmother; Kidney disease in her mother; Valvular heart disease in her son.   ROS:   Please see the history of present illness.    ROS All other systems are reviewed and are negative.   PHYSICAL EXAM:   VS:  BP 108/66   Pulse 71   Ht 5' (1.524 m)   Wt 171 lb 3.2 oz (77.7 kg)   SpO2 99%   BMI 33.44 kg/m      General: Alert, oriented x3, no distress, mildly obese,  healthy subclavian defibrillator site Head: no evidence of trauma, PERRL, EOMI, no exophtalmos or lid lag,  no myxedema, no xanthelasma; normal ears, nose and oropharynx Neck: normal jugular venous pulsations and no hepatojugular reflux; brisk carotid pulses without delay and no carotid bruits Chest: clear to auscultation, no signs of consolidation by percussion or palpation, normal fremitus, symmetrical and full respiratory excursions Cardiovascular: normal position and quality of the apical impulse, regular rhythm, normal first and paradoxically split second heart sounds, 1-2/6 early peaking aortic ejection murmur, no diastolic murmurs, rubs or gallops Abdomen: no tenderness or distention, no masses by palpation, no abnormal pulsatility or arterial bruits, normal bowel sounds, no hepatosplenomegaly Extremities: no clubbing, cyanosis or edema; 2+ radial, ulnar and brachial pulses bilaterally; 2+ right femoral, posterior tibial and dorsalis pedis pulses; 2+ left femoral, posterior tibial and dorsalis pedis pulses; no subclavian or femoral bruits Neurological: grossly nonfocal Psych: Normal mood and affect    Wt Readings from Last 3 Encounters:  08/21/20 171 lb 3.2 oz (77.7 kg)  04/23/20 155 lb (70.3 kg)  03/12/20 152 lb (68.9 kg)      Studies/Labs Reviewed:   ECHO 03/05/2020:  1. Stable post TAVR findings, unchanged and normal mean transaortic  gradient 16 mmHg, no paravalvular leak.  2. Left ventricular ejection fraction, by estimation, is 45 to 50%. The  left ventricle has mildly decreased function. The left ventricle  demonstrates regional wall motion abnormalities (see scoring  diagram/findings for description). There is moderate  concentric left ventricular hypertrophy. Left ventricular diastolic  function could not be evaluated.  3. Right ventricular systolic function is normal. The right ventricular  size is normal. There is moderately elevated pulmonary artery systolic   pressure. The estimated right ventricular systolic pressure is 26.9 mmHg.  4. Left atrial size was mildly dilated.  5. Right atrial size was mildly dilated.  6. The mitral valve is normal in structure. Trivial mitral valve  regurgitation. No evidence of mitral stenosis.  7. The aortic valve has been repaired/replaced. Aortic valve  regurgitation is not visualized. No aortic stenosis is present. There is a  23 mm Edwards Sapien prosthetic (TAVR) valve present in the aortic  position. Procedure Date: 01/29/20.  8. The inferior vena cava is normal in size with greater than 50%  respiratory variability, suggesting right atrial pressure of 3 mmHg.   Comparison(s): 01/30/20 EF 45-50%. AV 50mmHg peak, 48mmHg mean.    EKG:  EKG is  ordered today.  It shows atrial paced, ventricular paced rhythm  Recent Labs: 01/25/2020: ALT 15; B Natriuretic Peptide 765.0 01/30/2020: Magnesium 1.7 02/01/2020: BUN 45; Creatinine, Ser 1.50; Hemoglobin 9.6; Platelets 92; Potassium 3.9; Sodium 134   Lipid Panel    Component Value Date/Time   CHOL 143 06/28/2017 1446   TRIG 107.0 06/28/2017 1446   HDL 51.40 06/28/2017 1446   CHOLHDL 3 06/28/2017 1446   VLDL 21.4 06/28/2017 1446   LDLCALC 70 06/28/2017 1446   LDLDIRECT 217.7 09/13/2011 1535     ASSESSMENT:    1. Chronic combined systolic and diastolic heart failure ([place])   2. S/P TAVR (transcatheter aortic valve replacement)   3. SSS (sick sinus syndrome) ([place])   4. ICD (implantable cardioverter-defibrillator) in place   5. Ventricular tachycardia (HCC)   6. Paroxysmal atrial flutter ([place])   7. 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, status post graft repair the ascending aorta   8. Essential hypertension   9. Hypercholesterolemia   10. Type 2 diabetes mellitus with complication (HCC)   11. Mild obesity  PLAN:  In order of problems listed above:  1. CHF:  Well compensated, clinically euvolemic, on  appropriate meds including maximum dose carvedilol Jardiance, ARB, relatively low-dose loop diuretic. 2. AS s/p transapical TAVR: Marked clinical improvement after TAVR and favorable echo parameters. Reinforced endocarditis prophylaxis. 3. SSS/AV block: For safety purposes she should be considered pacemaker dependent.  Attempts to diminish ventricular pacing by using MVP exposure to the risk of torsades de pointes due to ventricular pauses.  Does not have symptoms of chronotropic incompetence and the heart rate histogram appears acceptable.  Needs high-dose beta-blocker due to her history of aortic dissection and heart failure. 4. ICD: Normal device function.  Essentially 100% AV sequential pacing.  5. VT: It's been a year since her episode of ventricular tachycardia with syncope, without recurrence.  It's been 6 months since her TAVR procedure.  I think she can restart driving, cautiously at first. 6. AFlutter: This has not been detected in over 5 years of device monitoring and she is not receiving anticoagulation. 7. Hx Ao dissection: Stable by CT on yearly follow-up with Dr. Servando [name] 8. HTN: Very well controlled.  As she lost weight we actually had to cut back on her ARB. 9. HLP: In spite of her many coronary risk factors and multiple other cardiac problems she has never had CAD or PAD.  Target LDL is acceptable at less than 100. According to Dr. Durwin Nora Little's report, her coronaries were "spotless" at angiography in 2008. No sign of atherosclerotic calcifications seen on serial CTs of the chest and abdomen 10. DM: Most recent A1c was exceptional at 5.4%.  On Jardiance monotherapy. 11. Obesity: She is to be congratulated on excellent weight loss, although recently she has put some weight back     Medication Adjustments/Labs and Tests Ordered: Current medicines are reviewed at length with the patient today.  Concerns regarding medicines are outlined above.  Medication changes, Labs and Tests ordered  today are listed in the Patient Instructions below. Patient Instructions  Medication Instructions:  STOP the Clopidogrel (plavix)  *If you need a refill on your cardiac medications before your next appointment, please call your pharmacy*   Lab Work: None ordered If you have labs (blood work) drawn today and your tests are completely normal, you will receive your results only by: [name] MyChart Message (if you have MyChart) OR . A paper copy in the mail If you have any lab test that is abnormal or we need to change your treatment, we will call you to review the results.   Testing/Procedures: None ordered   Follow-Up: At [facility], you and your health needs are our priority.  As part of our continuing mission to provide you with exceptional heart care, we have created designated Provider Care Teams.  These Care Teams include your primary Cardiologist (physician) and Advanced Practice Providers (APPs -  Physician Assistants and Nurse Practitioners) who all work together to provide you with the care you need, when you need it.  We recommend signing up for the patient portal called "MyChart".  Sign up information is provided on this After Visit Summary.  MyChart is used to connect with patients for Virtual Visits (Telemedicine).  Patients are able to view lab/test results, encounter notes, upcoming appointments, etc.  Non-urgent messages can be sent to your provider as well.   To learn more about what you can do with MyChart, go to [email or web address].    Your next appointment:   6 month(s)  The format for your  next appointment:   In Person  Provider:   Sanda Klein, MD      Signed, Anna Klein, MD  08/21/2020 9:11 PM    [facility] [address], [place], [place]  [address] Phone: [phone]; Fax: [phone]

## 2020-08-21 ENCOUNTER — Encounter: Payer: Self-pay | Admitting: Cardiovascular Disease

## 2020-08-21 ENCOUNTER — Other Ambulatory Visit: Payer: Self-pay

## 2020-08-21 ENCOUNTER — Ambulatory Visit (INDEPENDENT_AMBULATORY_CARE_PROVIDER_SITE_OTHER): Payer: Medicare HMO | Admitting: Cardiovascular Disease

## 2020-08-21 VITALS — BP 108/66 | HR 71 | Ht 60.0 in | Wt 171.2 lb

## 2020-08-21 DIAGNOSIS — E118 Type 2 diabetes mellitus with unspecified complications: Secondary | ICD-10-CM

## 2020-08-21 DIAGNOSIS — I7101 Dissection of thoracic aorta: Secondary | ICD-10-CM | POA: Diagnosis not present

## 2020-08-21 DIAGNOSIS — Z95810 Presence of automatic (implantable) cardiac defibrillator: Secondary | ICD-10-CM

## 2020-08-21 DIAGNOSIS — I472 Ventricular tachycardia, unspecified: Secondary | ICD-10-CM

## 2020-08-21 DIAGNOSIS — E7800 Pure hypercholesterolemia, unspecified: Secondary | ICD-10-CM | POA: Diagnosis not present

## 2020-08-21 DIAGNOSIS — I4892 Unspecified atrial flutter: Secondary | ICD-10-CM

## 2020-08-21 DIAGNOSIS — I10 Essential (primary) hypertension: Secondary | ICD-10-CM

## 2020-08-21 DIAGNOSIS — E669 Obesity, unspecified: Secondary | ICD-10-CM

## 2020-08-21 DIAGNOSIS — I5042 Chronic combined systolic (congestive) and diastolic (congestive) heart failure: Secondary | ICD-10-CM | POA: Diagnosis not present

## 2020-08-21 DIAGNOSIS — Z952 Presence of prosthetic heart valve: Secondary | ICD-10-CM | POA: Diagnosis not present

## 2020-08-21 DIAGNOSIS — I71019 Dissection of thoracic aorta, unspecified: Secondary | ICD-10-CM

## 2020-08-21 DIAGNOSIS — I495 Sick sinus syndrome: Secondary | ICD-10-CM | POA: Diagnosis not present

## 2020-08-21 NOTE — Patient Instructions (Signed)
Medication Instructions:  STOP the Clopidogrel (plavix)  *If you need a refill on your cardiac medications before your next appointment, please call your pharmacy*   Lab Work: None ordered If you have labs (blood work) drawn today and your tests are completely normal, you will receive your results only by: [name] MyChart Message (if you have MyChart) OR . A paper copy in the mail If you have any lab test that is abnormal or we need to change your treatment, we will call you to review the results.   Testing/Procedures: None ordered   Follow-Up: At [facility], you and your health needs are our priority.  As part of our continuing mission to provide you with exceptional heart care, we have created designated Provider Care Teams.  These Care Teams include your primary Cardiologist (physician) and Advanced Practice Providers (APPs -  Physician Assistants and Nurse Practitioners) who all work together to provide you with the care you need, when you need it.  We recommend signing up for the patient portal called "MyChart".  Sign up information is provided on this After Visit Summary.  MyChart is used to connect with patients for Virtual Visits (Telemedicine).  Patients are able to view lab/test results, encounter notes, upcoming appointments, etc.  Non-urgent messages can be sent to your provider as well.   To learn more about what you can do with MyChart, go to [email or web address].    Your next appointment:   6 month(s)  The format for your next appointment:   In Person  Provider:   Sanda Klein, MD

## 2020-08-22 ENCOUNTER — Telehealth: Payer: Self-pay

## 2020-08-22 NOTE — Telephone Encounter (Signed)
Ok, she doesn't get charged for the one she sent today, there was no order for it. Its like a "test" one.

## 2020-08-22 NOTE — Telephone Encounter (Signed)
Thanks for fixing. I did bill for the in-office check yesterday.

## 2020-08-22 NOTE — Telephone Encounter (Signed)
Returning patients phone call.   Transmission reviewed, normal remote finding. Advised patient if we see anything we will call her. Patient thankful and appreciative for call.  Remotes are now scheduled every 91 days. Next remote 08/25/20.  Routing to Dr. Sallyanne Kuster to update.

## 2020-08-22 NOTE — Telephone Encounter (Signed)
Patient called in stating that she spoke with carelink and the mistake was on our end. I checked and patient serial # was not updated which I fixed and you shouold be able to see her transmissions now. If you will give patient a call back as she is really concerned

## 2020-08-22 NOTE — Telephone Encounter (Signed)
Patient has missed remotes. Called patient to help send a manual transmission.  Attempted twice to send manual transmission. Unsuccessful. Patient given medtronic tech support #. Requested patient to call DC back with update. Patient agreeable to plan.

## 2020-08-24 LAB — CUP PACEART REMOTE DEVICE CHECK
Battery Remaining Longevity: 89 mo
Battery Voltage: 2.99 V
Brady Statistic AP VP Percent: 99.99 %
Brady Statistic AP VS Percent: 0 %
Brady Statistic AS VP Percent: 0.01 %
Brady Statistic AS VS Percent: 0 %
Brady Statistic RA Percent Paced: 99.99 %
Brady Statistic RV Percent Paced: 100 %
Date Time Interrogation Session: 20210924142604
HighPow Impedance: 70 Ohm
Implantable Lead Implant Date: 20071016
Implantable Lead Implant Date: 20071016
Implantable Lead Implant Date: 20200730
Implantable Lead Location: 753859
Implantable Lead Location: 753860
Implantable Lead Location: 753860
Implantable Lead Model: 4092
Implantable Lead Model: 5594
Implantable Lead Model: 6935
Implantable Pulse Generator Implant Date: 20200730
Lead Channel Impedance Value: 342 Ohm
Lead Channel Impedance Value: 437 Ohm
Lead Channel Impedance Value: 513 Ohm
Lead Channel Pacing Threshold Amplitude: 0.625 V
Lead Channel Pacing Threshold Amplitude: 0.625 V
Lead Channel Pacing Threshold Pulse Width: 0.4 ms
Lead Channel Pacing Threshold Pulse Width: 0.4 ms
Lead Channel Sensing Intrinsic Amplitude: 26.375 mV
Lead Channel Sensing Intrinsic Amplitude: 26.375 mV
Lead Channel Sensing Intrinsic Amplitude: 4.125 mV
Lead Channel Sensing Intrinsic Amplitude: 4.125 mV
Lead Channel Setting Pacing Amplitude: 1.5 V
Lead Channel Setting Pacing Amplitude: 2.5 V
Lead Channel Setting Pacing Pulse Width: 0.4 ms
Lead Channel Setting Sensing Sensitivity: 0.3 mV

## 2020-08-25 ENCOUNTER — Ambulatory Visit (INDEPENDENT_AMBULATORY_CARE_PROVIDER_SITE_OTHER): Payer: Medicare HMO | Admitting: Emergency Medicine

## 2020-08-25 DIAGNOSIS — I495 Sick sinus syndrome: Secondary | ICD-10-CM

## 2020-08-26 ENCOUNTER — Other Ambulatory Visit: Payer: Self-pay | Admitting: Family Medicine

## 2020-08-26 DIAGNOSIS — F419 Anxiety disorder, unspecified: Secondary | ICD-10-CM

## 2020-08-26 NOTE — Telephone Encounter (Signed)
Medication: ALPRAZolam (XANAX) 0.5 MG tablet [[id number]]       Has the patient contacted their pharmacy?  (If no, request that the patient contact the pharmacy for the refill.) (If yes, when and what did the pharmacy advise?)     Preferred Pharmacy (with phone number or street name): [id number] - [address], [address]  [place], [place] [address]  Phone:  [phone] Fax:  [phone]      Agent: Please be advised that RX refills may take up to 3 business days. We ask that you follow-up with your pharmacy.

## 2020-08-27 ENCOUNTER — Other Ambulatory Visit: Payer: Self-pay | Admitting: Family Medicine

## 2020-08-27 DIAGNOSIS — F419 Anxiety disorder, unspecified: Secondary | ICD-10-CM

## 2020-08-27 MED ORDER — ALPRAZOLAM 0.5 MG PO TABS: 0.5000 mg | ORAL_TABLET | Freq: Three times a day (TID) | ORAL | 0 refills | Status: DC | PRN

## 2020-08-27 NOTE — Addendum Note (Signed)
Addended byDamita Dunnings D on: 08/27/2020 08:13 AM   Modules accepted: Orders

## 2020-08-27 NOTE — Telephone Encounter (Signed)
Patient schedule follow up appointment on 09/02/20 .

## 2020-08-27 NOTE — Telephone Encounter (Signed)
Alprazolam refill.   Last OV: 04/23/2020 Last Fill: 07/26/2020 #90 and 0RF Pt sig: 1 tab tid prn UDS: 10/26/2017 Low risk

## 2020-08-28 NOTE — Progress Notes (Signed)
Remote ICD transmission.   

## 2020-09-02 ENCOUNTER — Encounter: Payer: Self-pay | Admitting: Family Medicine

## 2020-09-02 ENCOUNTER — Ambulatory Visit (INDEPENDENT_AMBULATORY_CARE_PROVIDER_SITE_OTHER): Payer: Medicare HMO | Admitting: Family Medicine

## 2020-09-02 ENCOUNTER — Other Ambulatory Visit: Payer: Self-pay

## 2020-09-02 VITALS — BP 120/60 | HR 72 | Temp 98.2°F | Resp 18 | Ht 60.0 in | Wt 170.4 lb

## 2020-09-02 DIAGNOSIS — E1151 Type 2 diabetes mellitus with diabetic peripheral angiopathy without gangrene: Secondary | ICD-10-CM | POA: Diagnosis not present

## 2020-09-02 DIAGNOSIS — Z79899 Other long term (current) drug therapy: Secondary | ICD-10-CM

## 2020-09-02 DIAGNOSIS — E1169 Type 2 diabetes mellitus with other specified complication: Secondary | ICD-10-CM | POA: Diagnosis not present

## 2020-09-02 DIAGNOSIS — E785 Hyperlipidemia, unspecified: Secondary | ICD-10-CM | POA: Insufficient documentation

## 2020-09-02 DIAGNOSIS — I5043 Acute on chronic combined systolic (congestive) and diastolic (congestive) heart failure: Secondary | ICD-10-CM | POA: Diagnosis not present

## 2020-09-02 DIAGNOSIS — E1165 Type 2 diabetes mellitus with hyperglycemia: Secondary | ICD-10-CM

## 2020-09-02 DIAGNOSIS — Z23 Encounter for immunization: Secondary | ICD-10-CM

## 2020-09-02 DIAGNOSIS — I10 Essential (primary) hypertension: Secondary | ICD-10-CM

## 2020-09-02 DIAGNOSIS — F419 Anxiety disorder, unspecified: Secondary | ICD-10-CM | POA: Diagnosis not present

## 2020-09-02 DIAGNOSIS — IMO0002 Reserved for concepts with insufficient information to code with codable children: Secondary | ICD-10-CM

## 2020-09-02 MED ORDER — ROSUVASTATIN CALCIUM 20 MG PO TABS: 20.0000 mg | ORAL_TABLET | Freq: Every day | ORAL | 1 refills | Status: DC

## 2020-09-02 NOTE — Assessment & Plan Note (Signed)
Pt has lost 60 lbs Recheck labs today

## 2020-09-02 NOTE — Assessment & Plan Note (Signed)
Per cardiology 

## 2020-09-02 NOTE — Assessment & Plan Note (Signed)
Stable  con't prn xanax  

## 2020-09-02 NOTE — Progress Notes (Signed)
Patient ID: [name], female    DOB: 1946-02-10  Age: 74 y.o. MRN: [id number]    Subjective:  Subjective  HPI [name] presents for f/u cholesterol and anxiety     No complaints    Pt is doing well   Review of Systems  Constitutional: Negative for appetite change, diaphoresis, fatigue and unexpected weight change.  Eyes: Negative for pain, redness and visual disturbance.  Respiratory: Negative for cough, chest tightness, shortness of breath and wheezing.   Cardiovascular: Negative for chest pain, palpitations and leg swelling.  Endocrine: Negative for cold intolerance, heat intolerance, polydipsia, polyphagia and polyuria.  Genitourinary: Negative for difficulty urinating, dysuria and frequency.  Neurological: Negative for dizziness, light-headedness, numbness and headaches.    History Past Medical History:  Diagnosis Date  . Acute thoracic aortic dissection [facility]) 2008   emergency surgery - [place]  . AICD (automatic cardioverter/defibrillator) present   . Anxiety   . Cancer ([place])    skin  . CKD (chronic kidney disease) stage 3, GFR 30-59 ml/min (HCC)   . Diabetes mellitus   . Diverticulosis   . GERD (gastroesophageal reflux disease)   . Hemorrhoids   . History of kidney stones   . Hyperlipidemia   . Hypertension   . Kidney stones   . S/P TAVR (transcatheter aortic valve replacement) 01/29/2020   s/p TAVR with a 23 mm Edwards Sapien 3 Ultra via the TA approach wtih Dr. Burt Knack and Dr. Cyndia Bent  . Severe aortic stenosis   . Sinus node dysfunction (HCC)    hx of PPM  . Sustained ventricular tachycardia ([place])    s/p ICD 05/2019    She has a past surgical history that includes Pacemaker insertion (08/2006); Appendectomy; Abdominal hysterectomy (1984); Thoracic aortic aneurysm repair (2000); Repair of acute ascending thoracic aortic dissection (2008); transthoracic echocardiogram (09/20/2012); Pacemaker generator change (2014); Eye surgery (2014); pacemaker generator  change (N/A, 08/29/2013); ICD IMPLANT (N/A, 06/28/2019); RIGHT/LEFT HEART CATH AND CORONARY ANGIOGRAPHY (N/A, 01/09/2020); Transcatheter aortic valve replacement, transapical (N/A, 01/29/2020); and TEE without cardioversion (N/A, 01/29/2020).   Her family history includes Heart attack in her father and maternal grandmother; Kidney disease in her mother; Valvular heart disease in her son.She reports that she quit smoking about 27 years ago. She has never used smokeless tobacco. She reports that she does not drink alcohol and does not use drugs.  Current Outpatient Medications on File Prior to Visit  Medication Sig Dispense Refill  . ALPRAZolam (XANAX) 0.5 MG tablet Take 1 tablet (0.5 mg total) by mouth 3 (three) times daily as needed for anxiety. 90 tablet 0  . amoxicillin (AMOXIL) 500 MG tablet Take 4 tablets (2,000 mg total) by mouth as directed. 1 hour prior to dental work including cleanings 12 tablet 12  . Ascorbic Acid (VITAMIN C) 1000 MG tablet Take 1,000 mg by mouth daily.    [name] aspirin 81 MG chewable tablet Chew 1 tablet (81 mg total) by mouth daily. 90 tablet 3  . carvedilol (COREG) 25 MG tablet TAKE 1 TABLET BY MOUTH TWICE A DAY WITH A MEAL 180 tablet 1  . citalopram (CELEXA) 20 MG tablet Take 1 tablet (20 mg total) by mouth daily. 270 tablet 9  . ferrous sulfate 325 (65 FE) MG tablet Take 325 mg by mouth daily with breakfast.    . fluticasone (FLONASE) 50 MCG/ACT nasal spray USE 2 SPRAYS IN EACH NOSTIRL EVERY DAY (Patient taking differently: Place 2 sprays into both nostrils daily as needed for  allergies. ) 16 g 1  . furosemide (LASIX) 20 MG tablet TAKE 1 TABLET (20 MG TOTAL) BY MOUTH DAILY. OKAY TO TAKE 1 EXTRA TABLET AS NEEDED FOR SWELLING. 180 tablet 2  . JARDIANCE 10 MG TABS tablet TAKE 1 TABLET(10 MG) BY MOUTH DAILY. 90 tablet 1  . Omega-3 Fatty Acids (FISH OIL) 1000 MG CAPS Take 1,000 mg by mouth daily.     . Potassium Chloride ER 20 MEQ TBCR TAKE 1 TABLET BY MOUTH EVERY DAY 90 tablet 4  .  Probiotic Product (PRO-BIOTIC BLEND PO) Take 1 tablet by mouth daily.     [name] losartan (COZAAR) 25 MG tablet Take 0.5 tablets (12.5 mg total) by mouth at bedtime. 90 tablet 3   Current Facility-Administered Medications on File Prior to Visit  Medication Dose Route Frequency Provider Last Rate Last Admin  . diclofenac sodium (VOLTAREN) 1 % transdermal gel 2 g  2 g Topical QID Carollee Herter, Kendrick Fries R, DO         Objective:  Objective  Physical Exam Vitals and nursing note reviewed.  Constitutional:      Appearance: She is well-developed.  HENT:     Head: Normocephalic and atraumatic.  Eyes:     Conjunctiva/sclera: Conjunctivae normal.  Neck:     Thyroid: No thyromegaly.     Vascular: No carotid bruit or JVD.  Cardiovascular:     Rate and Rhythm: Normal rate and regular rhythm.     Heart sounds: Murmur heard.   Pulmonary:     Effort: Pulmonary effort is normal. No respiratory distress.     Breath sounds: Normal breath sounds. No wheezing or rales.  Chest:     Chest wall: No tenderness.  Musculoskeletal:     Cervical back: Normal range of motion and neck supple.  Neurological:     Mental Status: She is alert and oriented to person, place, and time.    BP 120/60 (BP Location: Left Arm, Patient Position: Sitting, Cuff Size: Normal)   Pulse 72   Temp 98.2 F (36.8 C) (Oral)   Resp 18   Ht 5' (1.524 m)   Wt 170 lb 6.4 oz (77.3 kg)   SpO2 98%   BMI 33.28 kg/m  Wt Readings from Last 3 Encounters:  09/02/20 170 lb 6.4 oz (77.3 kg)  08/21/20 171 lb 3.2 oz (77.7 kg)  04/23/20 155 lb (70.3 kg)     Lab Results  Component Value Date   WBC 4.0 02/01/2020   HGB 9.6 (L) 02/01/2020   HCT 29.5 (L) 02/01/2020   PLT 92 (L) 02/01/2020   GLUCOSE 90 09/02/2020   CHOL 134 09/02/2020   TRIG 181 (H) 09/02/2020   HDL 43 (L) 09/02/2020   LDLDIRECT 217.7 09/13/2011   LDLCALC 65 09/02/2020   ALT 15 09/02/2020   AST 17 09/02/2020   NA 138 09/02/2020   K 5.1 09/02/2020   CL 108  09/02/2020   CREATININE 1.14 (H) 09/02/2020   BUN 42 (H) 09/02/2020   CO2 24 09/02/2020   TSH 0.893 06/28/2019   INR 1.1 01/25/2020   HGBA1C 5.4 01/25/2020   MICROALBUR 1.6 01/21/2014    DG Chest 2 View  Result Date: 01/25/2020 CLINICAL DATA:  Pre admit for severe aortic stenosis. EXAM: CHEST - 2 VIEW COMPARISON:  One-view chest x-ray 06/29/2019 FINDINGS: The heart is mildly enlarged, exaggerated by low lung volumes. Pacing and defibrillator wires are in stable and satisfactory position. Lungs are clear. No edema or effusion is present.  Focal airspace disease is present. Exaggerated thoracic kyphosis is noted. IMPRESSION: No acute cardiopulmonary disease or significant interval change. Electronically Signed   By: San Morelle M.D.   On: 01/25/2020 10:01     Assessment & Plan:  Plan  I have changed Dawsyn L. Piotrowski's rosuvastatin. I am also having her maintain her Probiotic Product (PRO-BIOTIC BLEND PO), fluticasone, Fish Oil, losartan, Potassium Chloride ER, ferrous sulfate, vitamin C, aspirin, citalopram, amoxicillin, furosemide, Jardiance, carvedilol, and ALPRAZolam. We will continue to administer diclofenac sodium.  Meds ordered this encounter  Medications  . rosuvastatin (CRESTOR) 20 MG tablet    Sig: Take 1 tablet (20 mg total) by mouth daily.    Dispense:  90 tablet    Refill:  1    Problem List Items Addressed This Visit      Unprioritized   Acute on chronic combined systolic and diastolic CHF (congestive heart failure) ([place])    Per cardiology      Relevant Medications   rosuvastatin (CRESTOR) 20 MG tablet   Anxiety    Stable con't prn xanax      Relevant Orders   DRUG MONITORING, PANEL 8 WITH CONFIRMATION, URINE   DM (diabetes mellitus) type II uncontrolled, periph vascular disorder (HCC)    Pt has lost 60 lbs Recheck labs today      Relevant Medications   rosuvastatin (CRESTOR) 20 MG tablet   Hyperlipidemia associated with type 2 diabetes mellitus  ([place])    Encouraged heart healthy diet, increase exercise, avoid trans fats, consider a krill oil cap daily      Relevant Medications   rosuvastatin (CRESTOR) 20 MG tablet   Other Relevant Orders   Lipid panel (Completed)   Microalbumin / creatinine urine ratio   Primary hypertension   Relevant Medications   rosuvastatin (CRESTOR) 20 MG tablet   Uncontrolled type 2 diabetes mellitus with hyperglycemia (HCC) - Primary   Relevant Medications   rosuvastatin (CRESTOR) 20 MG tablet   Other Relevant Orders   Comprehensive metabolic panel (Completed)   Hemoglobin A1c   Microalbumin / creatinine urine ratio    Other Visit Diagnoses    Need for influenza vaccination       Relevant Orders   Flu Vaccine QUAD High Dose(Fluad) (Completed)   High risk medication use       Relevant Orders   DRUG MONITORING, PANEL 8 WITH CONFIRMATION, URINE      Follow-up: Return in about 6 months (around 03/03/2021), or if symptoms worsen or fail to improve, for annual exam, fasting.  [name], DO

## 2020-09-02 NOTE — Assessment & Plan Note (Signed)
Encouraged heart healthy diet, increase exercise, avoid trans fats, consider a krill oil cap daily 

## 2020-09-02 NOTE — Patient Instructions (Addendum)

## 2020-09-03 LAB — LIPID PANEL
Cholesterol: 134 mg/dL (ref <200)
HDL: 43 mg/dL — ABNORMAL LOW (ref >=50)
LDL Cholesterol (Calc): 65 mg/dL (calc)
Non-HDL Cholesterol (Calc): 91 mg/dL (calc) (ref <130)
Total CHOL/HDL Ratio: 3.1 (calc) (ref <5.0)
Triglycerides: 181 mg/dL — ABNORMAL HIGH (ref <150)

## 2020-09-03 LAB — COMPREHENSIVE METABOLIC PANEL
AG Ratio: 1.5 (calc) (ref 1.0–2.5)
ALT: 15 U/L (ref 6–29)
AST: 17 U/L (ref 10–35)
Albumin: 4.4 g/dL (ref 3.6–5.1)
Alkaline phosphatase (APISO): 62 U/L (ref 37–153)
BUN/Creatinine Ratio: 37 (calc) — ABNORMAL HIGH (ref 6–22)
BUN: 42 mg/dL — ABNORMAL HIGH (ref 7–25)
CO2: 24 mmol/L (ref 20–32)
Calcium: 9.6 mg/dL (ref 8.6–10.4)
Chloride: 108 mmol/L (ref 98–110)
Creat: 1.14 mg/dL — ABNORMAL HIGH (ref 0.60–0.93)
Globulin: 3 g/dL (calc) (ref 1.9–3.7)
Glucose, Bld: 90 mg/dL (ref 65–99)
Potassium: 5.1 mmol/L (ref 3.5–5.3)
Sodium: 138 mmol/L (ref 135–146)
Total Bilirubin: 0.3 mg/dL (ref 0.2–1.2)
Total Protein: 7.4 g/dL (ref 6.1–8.1)

## 2020-09-03 LAB — HEMOGLOBIN A1C
Hgb A1c MFr Bld: 5.9 % of total Hgb — ABNORMAL HIGH (ref <5.7)
Mean Plasma Glucose: 123 (calc)
eAG (mmol/L): 6.8 (calc)

## 2020-09-03 LAB — MICROALBUMIN / CREATININE URINE RATIO
Creatinine, Urine: 57 mg/dL (ref 20–275)
Microalb Creat Ratio: 16 mcg/mg creat (ref <30)
Microalb, Ur: 0.9 mg/dL

## 2020-09-04 LAB — DRUG MONITORING, PANEL 8 WITH CONFIRMATION, URINE
6 Acetylmorphine: NEGATIVE ng/mL (ref <10)
Alcohol Metabolites: NEGATIVE ng/mL
Alphahydroxyalprazolam: 131 ng/mL — ABNORMAL HIGH (ref <25)
Alphahydroxymidazolam: NEGATIVE ng/mL (ref <50)
Alphahydroxytriazolam: NEGATIVE ng/mL (ref <50)
Aminoclonazepam: NEGATIVE ng/mL (ref <25)
Amphetamines: NEGATIVE ng/mL (ref <500)
Benzodiazepines: POSITIVE ng/mL — AB (ref <100)
Buprenorphine, Urine: NEGATIVE ng/mL (ref <5)
Cocaine Metabolite: NEGATIVE ng/mL (ref <150)
Creatinine: 61.9 mg/dL
Hydroxyethylflurazepam: NEGATIVE ng/mL (ref <50)
Lorazepam: NEGATIVE ng/mL (ref <50)
MDMA: NEGATIVE ng/mL (ref <500)
Marijuana Metabolite: NEGATIVE ng/mL (ref <20)
Nordiazepam: NEGATIVE ng/mL (ref <50)
Opiates: NEGATIVE ng/mL (ref <100)
Oxazepam: NEGATIVE ng/mL (ref <50)
Oxidant: NEGATIVE ug/mL
Oxycodone: NEGATIVE ng/mL (ref <100)
Temazepam: NEGATIVE ng/mL (ref <50)
pH: 5.3 (ref 4.5–9.0)

## 2020-09-04 LAB — DM TEMPLATE

## 2020-09-24 ENCOUNTER — Other Ambulatory Visit: Payer: Self-pay | Admitting: Family Medicine

## 2020-09-24 DIAGNOSIS — F419 Anxiety disorder, unspecified: Secondary | ICD-10-CM

## 2020-09-24 NOTE — Telephone Encounter (Signed)
Patient would like a refill   ALPRAZolam 0.5 MG   [id number] [address],  - [place], [place] [address]  Phone:  [phone] Fax:  [phone]

## 2020-09-24 NOTE — Telephone Encounter (Signed)
Requesting:ALPRAZolam (XANAX) 0.5 MG tablet Contract:09/02/20 UDS:09/02/20 Last Visit:04/04/20 Next Visit:03/09/21 Last Refill:08/27/20  Please Advise

## 2020-09-27 ENCOUNTER — Other Ambulatory Visit: Payer: Self-pay | Admitting: Cardiovascular Disease

## 2020-09-30 ENCOUNTER — Other Ambulatory Visit: Payer: Self-pay | Admitting: Family Medicine

## 2020-09-30 ENCOUNTER — Other Ambulatory Visit: Payer: Self-pay | Admitting: Cardiovascular Disease

## 2020-09-30 DIAGNOSIS — I5032 Chronic diastolic (congestive) heart failure: Secondary | ICD-10-CM

## 2020-09-30 DIAGNOSIS — I10 Essential (primary) hypertension: Secondary | ICD-10-CM

## 2020-10-27 ENCOUNTER — Other Ambulatory Visit: Payer: Self-pay | Admitting: Family Medicine

## 2020-10-27 DIAGNOSIS — F419 Anxiety disorder, unspecified: Secondary | ICD-10-CM

## 2020-10-28 NOTE — Telephone Encounter (Signed)
Medication: ALPRAZolam (XANAX) 0.5 MG tablet [[id number]]    Has the patient contacted their pharmacy? No. (If no, request that the patient contact the pharmacy for the refill.) (If yes, when and what did the pharmacy advise?)  Preferred Pharmacy (with phone number or street name):  [id number] [address]  [place], [place] [address]  Phone:  [phone] Fax:  [phone]  DEA #:  [id number] Agent: Please be advised that RX refills may take up to 3 business days. We ask that you follow-up with your pharmacy.

## 2020-10-28 NOTE — Telephone Encounter (Signed)
Requesting: alprazolam 0.5mg  Contract: 09/02/2020 UDS: 09/02/2020 Last Visit: 09/02/2020 Next Visit: 03/09/2021 Last Refill: 09/24/2020 #90 and 0RF Pt sig: 1 tab tid prn  Please Advise

## 2020-11-01 ENCOUNTER — Other Ambulatory Visit: Payer: Self-pay | Admitting: Family Medicine

## 2020-11-01 ENCOUNTER — Other Ambulatory Visit: Payer: Self-pay | Admitting: Physician Assistant

## 2020-11-01 DIAGNOSIS — I10 Essential (primary) hypertension: Secondary | ICD-10-CM

## 2020-11-07 ENCOUNTER — Other Ambulatory Visit: Payer: Self-pay | Admitting: Family Medicine

## 2020-11-07 ENCOUNTER — Other Ambulatory Visit: Payer: Self-pay | Admitting: Physician Assistant

## 2020-11-07 DIAGNOSIS — I10 Essential (primary) hypertension: Secondary | ICD-10-CM

## 2020-11-24 ENCOUNTER — Ambulatory Visit (INDEPENDENT_AMBULATORY_CARE_PROVIDER_SITE_OTHER): Payer: Medicare HMO

## 2020-11-24 DIAGNOSIS — I495 Sick sinus syndrome: Secondary | ICD-10-CM

## 2020-11-25 ENCOUNTER — Other Ambulatory Visit: Payer: Self-pay | Admitting: Family Medicine

## 2020-11-25 DIAGNOSIS — F419 Anxiety disorder, unspecified: Secondary | ICD-10-CM

## 2020-11-25 LAB — CUP PACEART REMOTE DEVICE CHECK
Battery Remaining Longevity: 86 mo
Battery Voltage: 2.99 V
Brady Statistic AP VP Percent: 99.84 %
Brady Statistic AP VS Percent: 0 %
Brady Statistic AS VP Percent: 0.16 %
Brady Statistic AS VS Percent: 0 %
Brady Statistic RA Percent Paced: 99.84 %
Brady Statistic RV Percent Paced: 99.99 %
Date Time Interrogation Session: 20211227031804
HighPow Impedance: 88 Ohm
Implantable Lead Implant Date: 20071016
Implantable Lead Implant Date: 20071016
Implantable Lead Implant Date: 20200730
Implantable Lead Location: 753859
Implantable Lead Location: 753860
Implantable Lead Location: 753860
Implantable Lead Model: 4092
Implantable Lead Model: 5594
Implantable Lead Model: 6935
Implantable Pulse Generator Implant Date: 20200730
Lead Channel Impedance Value: 342 Ohm
Lead Channel Impedance Value: 456 Ohm
Lead Channel Impedance Value: 513 Ohm
Lead Channel Pacing Threshold Amplitude: 0.625 V
Lead Channel Pacing Threshold Amplitude: 0.625 V
Lead Channel Pacing Threshold Pulse Width: 0.4 ms
Lead Channel Pacing Threshold Pulse Width: 0.4 ms
Lead Channel Sensing Intrinsic Amplitude: 26.375 mV
Lead Channel Sensing Intrinsic Amplitude: 26.375 mV
Lead Channel Sensing Intrinsic Amplitude: 3.125 mV
Lead Channel Sensing Intrinsic Amplitude: 3.125 mV
Lead Channel Setting Pacing Amplitude: 1.5 V
Lead Channel Setting Pacing Amplitude: 2.5 V
Lead Channel Setting Pacing Pulse Width: 0.4 ms
Lead Channel Setting Sensing Sensitivity: 0.3 mV

## 2020-11-25 NOTE — Telephone Encounter (Signed)
Requesting: alprazolam 0.5mg  Contract: 09/02/2020 UDS: 09/02/2020 Last Visit: 09/02/2020 Next Visit: 03/09/2021 Last Refill: 10/28/2020 #90 and 0RF Pt sig: 1 tab tid prn  [place] Pt  Please Advise

## 2020-11-25 NOTE — Telephone Encounter (Signed)
Pt would like medication before she leave to go out of town   Medication: ALPRAZolam Prudy Feeler) 0.5 MG tablet    Has the patient contacted their pharmacy? No. (If no, request that the patient contact the pharmacy for the refill.) (If yes, when and what did the pharmacy advise?)  Preferred Pharmacy (with phone number or street name): [id number] [address] [facility] - [address]  [address] [place], [place] [address]  Phone:  [phone] Fax:  [phone]  DEA #:  [id number]  Agent: Please be advised that RX refills may take up to 3 business days. We ask that you follow-up with your pharmacy.

## 2020-12-05 NOTE — Progress Notes (Signed)
Remote ICD transmission.   

## 2020-12-18 ENCOUNTER — Other Ambulatory Visit: Payer: Self-pay | Admitting: Physician Assistant

## 2020-12-22 ENCOUNTER — Other Ambulatory Visit: Payer: Self-pay | Admitting: Family Medicine

## 2020-12-22 DIAGNOSIS — F419 Anxiety disorder, unspecified: Secondary | ICD-10-CM

## 2020-12-22 NOTE — Telephone Encounter (Signed)
Requesting: alprazolam 0.5mg  Contract: 09/02/2020 UDS: 09/02/2020 Last Visit: 09/02/2020 Next Visit: 03/09/2021 Last Refill: 11/25/2020 #90 and 0RF Pt sig: 1 tab tid prn  Please Advise

## 2021-01-23 ENCOUNTER — Other Ambulatory Visit: Payer: Self-pay | Admitting: Family Medicine

## 2021-01-23 DIAGNOSIS — F419 Anxiety disorder, unspecified: Secondary | ICD-10-CM

## 2021-01-23 NOTE — Telephone Encounter (Signed)
Requesting: alprazolam 0.5mg  Contract: 09/02/2020 UDS: 09/02/2020 Last Visit: 09/02/2020 Next Visit: 03/09/2021 Last Refill: 12/22/2020   Please Advise

## 2021-01-27 NOTE — Progress Notes (Signed)
[place]                                     Cardiology Office Note:    Date:  01/29/2021   ID:  JANEECE BLOK, DOB 1946/01/18, MRN [id number]  PCP:  Carollee Herter, Alferd Apa, DO  [facility] Cardiologist:  Sanda Klein, MD / Dr. Burt Knack & Dr. Cyndia Bent (TAVR) [place] Electrophysiologist:  None   Referring MD: Carollee Herter, Alferd Apa, *   1 year s/p TAVR  History of Present Illness:    YARETZI ERNANDEZ is a 75 y.o. female with a hx of type B aortic dissection in 2003-->managed conservatively, Type Adissections/p emergency surgical intervention in 2008 by EBG (aorta was replaced with a 28 mm graft from the level of the sinuses to the innominate artery and the aortic valve was resuspended), sinus node dysfunction s/p PPM, chronic combined S/D CHF, device re programing-->syncope in7/2020 2/2 to sustainedmonomorphic VT s/p ICD, CKD stage III, DMT2, anemia, HTN, HLD, GERD, obesity and LFLG severe ASs/p TAVR (01/29/20) who presents to clinic for follow up.  Last year, she developed worsening dyspnea and an echocardiogram 12/19/2019 showed reduction in her ejection fraction to 40 to 45% which was felt to be due to RV apical pacing. She was also found to have LFLG AS. L/RHC showed widely patent coronary arteries.The CTA of the chest, abdomen, and pelvis showeda chronic type A andB dissection with persistent false lumen involving the aortic arch beyond the suture line just proximal to the innominate artery and extending throughout the entire aortic arch and descending thoracic aorta.  She wasevaluated by the multidisciplinary valve teamand underwentsuccessful TAVR with a14mm Edwards Sapien 3 UltraTHV via the TAapproach on 01/29/20. Post operative echoshowed EF 45-50%, normally functioning TAVR with a mean gradient of 9.16mm Hg and no PVL. She was discharged on aspirin and plavix. 1 month echo 03/05/20 showed EF 45-50%, normally  functioning TAVR with a mean gradient of 9 mm Hg and no PVL. She has done quite well in follow up with a dramatic improvement in her breathing and energy. She has also lost quite a bit of weight.   Today she presents to clinic for follow up. Doing great. Lost a lot of weight and hair growing back better. No CP or SOB. No LE edema, orthopnea or PND. No dizziness or syncope. No blood in stool or urine. No palpitations. Since ICD placement she has had some mobility issues in left arm. No pain.     Past Medical History:  Diagnosis Date  . Acute thoracic aortic dissection [facility]) 2008   emergency surgery - [place]  . AICD (automatic cardioverter/defibrillator) present   . Anxiety   . Cancer ([place])    skin  . CKD (chronic kidney disease) stage 3, GFR 30-59 ml/min (HCC)   . Diabetes mellitus   . Diverticulosis   . GERD (gastroesophageal reflux disease)   . Hemorrhoids   . History of kidney stones   . Hyperlipidemia   . Hypertension   . Kidney stones   . S/P TAVR (transcatheter aortic valve replacement) 01/29/2020   s/p TAVR with a 23 mm Edwards Sapien 3 Ultra via the TA approach wtih Dr. Burt Knack and Dr. Cyndia Bent  . Severe aortic stenosis   . Sinus node dysfunction (HCC)    hx of PPM  . Sustained ventricular tachycardia ([place])  s/p ICD 05/2019    Past Surgical History:  Procedure Laterality Date  . ABDOMINAL HYSTERECTOMY  1984  . APPENDECTOMY    . EYE SURGERY  2014   Rght eye  . ICD IMPLANT N/A 06/28/2019   Procedure: PPM upgrade to ICD;  Surgeon: Evans Lance, MD;  Location: [place] CV LAB;  Service: Cardiovascular;  Laterality: N/A;  . PACEMAKER GENERATOR CHANGE  2014   Medtronic adapta  . PACEMAKER GENERATOR CHANGE N/A 08/29/2013   Procedure: PACEMAKER GENERATOR CHANGE;  Surgeon: Sanda Klein, MD;  Location: [place] CATH LAB;  Service: Cardiovascular;  Laterality: N/A;  . PACEMAKER INSERTION  08/2006   dual chamber Medtronic EnRhythm; r/t sinus node dysfunction   . REPAIR OF  ACUTE ASCENDING THORACIC AORTIC DISSECTION  2008   Dr. Servando Snare  . RIGHT/LEFT HEART CATH AND CORONARY ANGIOGRAPHY N/A 01/09/2020   Procedure: RIGHT/LEFT HEART CATH AND CORONARY ANGIOGRAPHY;  Surgeon: Sherren Mocha, MD;  Location: [place] CV LAB;  Service: Cardiovascular;  Laterality: N/A;  . TEE WITHOUT CARDIOVERSION N/A 01/29/2020   Procedure: TRANSESOPHAGEAL ECHOCARDIOGRAM (TEE);  Surgeon: Sherren Mocha, MD;  Location: [place];  Service: Open Heart Surgery;  Laterality: N/A;  . THORACIC AORTIC ANEURYSM REPAIR  2000   type III  . TRANSCATHETER AORTIC VALVE REPLACEMENT, TRANSAPICAL N/A 01/29/2020   Procedure: TRANSCATHETER AORTIC VALVE REPLACEMENT, TRANSAPICAL using a Edwards Sapien 3, 61mm  Transcatheter Heart Valve;  Surgeon: Sherren Mocha, MD;  Location: [place];  Service: Open Heart Surgery;  Laterality: N/A;  . TRANSTHORACIC ECHOCARDIOGRAM  09/20/2012   EF=>55% with mild conc LVH; LA mod dilated; RA mildly dilated; mild MR/TR/AR    Current Medications: Current Meds  Medication Sig  . ALPRAZolam (XANAX) 0.5 MG tablet TAKE 1 TABLET BY MOUTH THREE TIMES A DAY AS NEEDED FOR ANXIETY  . amoxicillin (AMOXIL) 500 MG tablet Take 4 tablets (2,000 mg total) by mouth as directed. 1 hour prior to dental work including cleanings  . Ascorbic Acid (VITAMIN C) 1000 MG tablet Take 1,000 mg by mouth daily.  . carvedilol (COREG) 25 MG tablet TAKE 1 TABLET BY MOUTH TWICE A DAY WITH A MEAL  . citalopram (CELEXA) 20 MG tablet Take 20 mg by mouth in the morning and at bedtime.  . CVS ASPIRIN ADULT LOW DOSE 81 MG chewable tablet CHEW 1 TABLET (81 MG TOTAL) BY MOUTH DAILY.  . fluticasone (FLONASE) 50 MCG/ACT nasal spray USE 2 SPRAYS IN EACH NOSTIRL EVERY DAY  . furosemide (LASIX) 20 MG tablet TAKE 1 TABLET BY MOUTH EVERY DAY  . furosemide (LASIX) 40 MG tablet TAKE 1/2 TABLET BY MOUTH DAILY  . JARDIANCE 10 MG TABS tablet TAKE 1 TABLET(10 MG) BY MOUTH DAILY.  [name] losartan (COZAAR) 25 MG tablet TAKE 1/2 TABLET BY  MOUTH AT BEDTIME.  [name] Omega-3 Fatty Acids (FISH OIL) 1000 MG CAPS Take 1,000 mg by mouth daily.   . Potassium Chloride ER 20 MEQ TBCR TAKE 1 TABLET BY MOUTH EVERY DAY  . Probiotic Product (PRO-BIOTIC BLEND PO) Take 1 tablet by mouth daily.   . rosuvastatin (CRESTOR) 20 MG tablet Take 1 tablet (20 mg total) by mouth daily.   Current Facility-Administered Medications for the 01/28/21 encounter (Office Visit) with Eileen Stanford, PA-C  Medication  . diclofenac sodium (VOLTAREN) 1 % transdermal gel 2 g     Allergies:   Oxycodone, Vicodin [hydrocodone-acetaminophen], Latex, Neomycin-bacitracin zn-polymyx, and Sulfonamide derivatives   Social History   Socioeconomic History  . Marital status: Widowed  Spouse name: Not on file  . Number of children: 2  . Years of education: Not on file  . Highest education level: Not on file  Occupational History    Employer: RETIRED  Tobacco Use  . Smoking status: Former Smoker    Quit date: 09/04/1993    Years since quitting: 27.4  . Smokeless tobacco: Never Used  Vaping Use  . Vaping Use: Never used  Substance and Sexual Activity  . Alcohol use: No  . Drug use: No  . Sexual activity: Not Currently  Other Topics Concern  . Not on file  Social History Narrative   Lives at home and husband was just moved to [place]   Social Determinants of Health   Financial Resource Strain: Not on file  Food Insecurity: Not on file  Transportation Needs: Not on file  Physical Activity: Not on file  Stress: Not on file  Social Connections: Not on file     Family History: The patient's family history includes Heart attack in her father and maternal grandmother; Kidney disease in her mother; Valvular heart disease in her son. There is no history of Cancer.  ROS:   Please see the history of present illness.    All other systems reviewed and are negative.  EKGs/Labs/Other Studies Reviewed:    The following studies were reviewed  today:  CARDIOTHORACIC SURGERY OPERATIVE NOTE  Date of Procedure:01/29/2020  Preoperative Diagnosis:Severe Aortic Stenosis   Postoperative Diagnosis:Same   Procedure:   Transcatheter Aortic Valve Replacement - Transapical Approach Edwards Sapien 3THV (size 64mm, model # 9600TFX, serial #[id number])  Co-Surgeons:Bryan Alveria Apley, MD and Sherren Mocha, MD   Anesthesiologist:Carswell Glennon Mac, MD  Echocardiographer:M. Croitoru, MD  Pre-operative Echo Findings: ? severe aortic stenosis ? Normalleft ventricular systolic function   Post-operative Echo Findings: ? Trivialparavalvular leak ? Normalleft ventricular systolic function  _____________   Echo 01/30/20 IMPRESSIONS 1. Left ventricular ejection fraction, by estimation, is 45 to 50%. The  left ventricle has mildly decreased function. The left ventricle has no  regional wall motion abnormalities. There is mild concentric left  ventricular hypertrophy. Left ventricular diastolic parameters are consistent with Grade II diastolic dysfunction (pseudonormalization). Elevated left atrial pressure.  2. Right ventricular systolic function is mildly reduced. The right  ventricular size is normal. There is moderately elevated pulmonary artery systolic pressure. The estimated right ventricular systolic pressure is  36.6 mmHg.  3. Left atrial size was moderately dilated.  4. Right atrial size was moderately dilated.  5. The mitral valve is normal in structure and function. Mild mitral  valve regurgitation.  6. Tricuspid valve regurgitation is mild to moderate.  7. The aortic valve has been repaired/replaced. Aortic valve  regurgitation is not visualized. There is a 23 mm Edwards Sapien  prosthetic (TAVR) valve present in the aortic position. Procedure Date: 01/29/2020. Echo findings are consistent  with normal  structure and function of the aortic valve prosthesis. Aortic valve area,  by VTI measures 2.07 cm. Aortic valve mean gradient measures 9.5 mmHg. Aortic valve Vmax measures 2.10 m/s. Dimensionless index 0.66.  8. Aortic root/ascending aorta has been repaired/replaced.  9. The inferior vena cava is dilated in size with >50% respiratory  variability, suggesting right atrial pressure of 8 mmHg.  ______________  Echo 03/06/20  IMPRESSIONS 1. Stable post TAVR findings, unchanged and normal mean transaortic gradient 16 mmHg, no paravalvular leak. 2. Left ventricular ejection fraction, by estimation, is 45 to 50%. The left ventricle has mildly decreased function. The left  ventricle demonstrates regional wall motion abnormalities (see scoring diagram/findings for description). There is moderate concentric left ventricular hypertrophy. Left ventricular diastolic function could not be evaluated. 3. Right ventricular systolic function is normal. The right ventricular size is normal. There is moderately elevated pulmonary artery systolic pressure. The estimated right ventricular systolic pressure is 75.1 mmHg. 4. Left atrial size was mildly dilated. 5. Right atrial size was mildly dilated. 6. The mitral valve is normal in structure. Trivial mitral valve regurgitation. No evidence of mitral stenosis. 7. The aortic valve has been repaired/replaced. Aortic valve regurgitation is not visualized. No aortic stenosis is present. There is a 23 mm Edwards Sapien prosthetic (TAVR) valve present in the aortic position. Procedure Date: 01/29/20. 8. The inferior vena cava is normal in size with greater than 50% respiratory variability, suggesting right atrial pressure of 3 mmHg.  Comparison(s): 01/30/20 EF 45-50%. AV 33mmHg peak, 72mmHg mean.  _______________________   Echo 01/28/2021 IMPRESSIONS  1. Left ventricular ejection fraction, by estimation, is 45 to 50%. The  left ventricle has mildly  decreased function. The left ventricle  demonstrates regional wall motion abnormalities. Abnormal (paradoxical)  septal motion, consistent with RV pacemaker.  There is moderate asymmetric left ventricular hypertrophy of the  basal-septal segment. Left ventricular diastolic parameters are consistent  with Grade I diastolic dysfunction (impaired relaxation).  2. Right ventricular systolic function is mildly reduced. The right  ventricular size is normal.  3. Right atrial size was mildly dilated.  4. The mitral valve is normal in structure. Trivial mitral valve  regurgitation. No evidence of mitral stenosis.  5. There is a 23 mm Sapien prosthetic, stented (TAVR) valve present in  the aortic position. Aortic valve regurgitation is not visualized. Echo  findings are consistent with normal structure and function of the aortic  valve prosthesis. Aortic valve mean  gradient measures 9.0 mmHg.   EKG:  EKG is ordered today.    Recent Labs: 02/01/2020: Hemoglobin 9.6; Platelets 92 09/02/2020: ALT 15; BUN 42; Creat 1.14; Potassium 5.1; Sodium 138  Recent Lipid Panel    Component Value Date/Time   CHOL 134 09/02/2020 1134   TRIG 181 (H) 09/02/2020 1134   HDL 43 (L) 09/02/2020 1134   CHOLHDL 3.1 09/02/2020 1134   VLDL 21.4 06/28/2017 1446   LDLCALC 65 09/02/2020 1134   LDLDIRECT 217.7 09/13/2011 1535     Risk Assessment/Calculations:       Physical Exam:    VS:  BP 120/80   Pulse 75   Ht 5' (1.524 m)   Wt 184 lb 12.8 oz (83.8 kg)   SpO2 97%   BMI 36.09 kg/m     Wt Readings from Last 3 Encounters:  01/28/21 184 lb 12.8 oz (83.8 kg)  09/02/20 170 lb 6.4 oz (77.3 kg)  08/21/20 171 lb 3.2 oz (77.7 kg)    GEN:  Well nourished, well developed in no acute distress HEENT: Normal NECK: No JVD; No carotid bruits LYMPHATICS: No lymphadenopathy CARDIAC: RRR, soft flow murmur. No rubs, gallops RESPIRATORY:  Clear to auscultation without rales, wheezing or rhonchi  ABDOMEN: Soft,  non-tender, non-distended MUSCULOSKELETAL:  No edema; No deformity  SKIN: Warm and dry NEUROLOGIC:  Alert and oriented x 3 PSYCHIATRIC:  Normal affect   ASSESSMENT:    1. S/P TAVR (transcatheter aortic valve replacement)   2. Chronic combined systolic and diastolic heart failure ([place])   3. Essential hypertension   4. Adrenal nodule ([place])   5. ICD (implantable cardioverter-defibrillator) in place  PLAN:    In order of problems listed above:  Severe ASs/p TAVR:doing great with NYHA class I symptoms. Echo today shows EF 45-50%, normally functioning TAVR with a mean gradient of 9 mm hg and no PVL. She understands the ongoing need for SBE prophylaxis and has amoxicillin. Continue on baby aspirin 81 mg daily indefinitely.   Chronic combined S/D CHF:EF 50%. No s/s CHF. Continue on Coreg 25mg  BID, Losartan 25mg  daily, Jardiance 10mg  daily as well as Lasix 20mg  PRN.   HTN:Bp well controlled. No changes made.   Adrenal nodule: pre TAVR CT showed a right adrenal nodule stable in size since 06/28/2019 CT, probably a benign adenoma. Follow-up CT abdomen without and with IV contrast in 12 months suggested. Orders for CT and Bmet are placed but pt would like to talk Malachy Mood and Dr. Jacquiline Doe and do it in April after her OV with Dr. Loletha Grayer.  VT s/p ICD: followed by Dr. Lovena Le. Says she has some mobility issues with left arm since ICD placement. ? Brachial plexus injury. No pain. She declines PT at this time.     Medication Adjustments/Labs and Tests Ordered: Current medicines are reviewed at length with the patient today.  Concerns regarding medicines are outlined above.  Orders Placed This Encounter  Procedures  . CT ABDOMEN W WO CONTRAST   No orders of the defined types were placed in this encounter.   Patient Instructions  Medication Instructions:  Your provider recommends that you continue on your current medications as directed. Please refer to the Current Medication list given to you  today.   *If you need a refill on your cardiac medications before your next appointment, please call your pharmacy*  Testing/Procedures: Joellen Jersey recommends you have an ABDOMINAL CT.  Follow-Up: Please keep your appointment with Dr. Sallyanne Kuster.    Signed, Anwita Mencer, PA-C  01/29/2021 6:53 AM    [facility]

## 2021-01-28 ENCOUNTER — Other Ambulatory Visit: Payer: Self-pay

## 2021-01-28 ENCOUNTER — Ambulatory Visit: Payer: Medicare HMO | Admitting: Physician Assistant

## 2021-01-28 ENCOUNTER — Encounter: Payer: Self-pay | Admitting: Physician Assistant

## 2021-01-28 ENCOUNTER — Ambulatory Visit (HOSPITAL_COMMUNITY): Payer: Medicare HMO | Attending: Cardiology

## 2021-01-28 VITALS — BP 120/80 | HR 75 | Ht 60.0 in | Wt 184.8 lb

## 2021-01-28 DIAGNOSIS — Z95810 Presence of automatic (implantable) cardiac defibrillator: Secondary | ICD-10-CM | POA: Diagnosis not present

## 2021-01-28 DIAGNOSIS — I10 Essential (primary) hypertension: Secondary | ICD-10-CM | POA: Diagnosis not present

## 2021-01-28 DIAGNOSIS — E278 Other specified disorders of adrenal gland: Secondary | ICD-10-CM | POA: Diagnosis not present

## 2021-01-28 DIAGNOSIS — Z952 Presence of prosthetic heart valve: Secondary | ICD-10-CM

## 2021-01-28 DIAGNOSIS — I5042 Chronic combined systolic (congestive) and diastolic (congestive) heart failure: Secondary | ICD-10-CM

## 2021-01-28 LAB — ECHOCARDIOGRAM COMPLETE
AR max vel: 1.85 cm2
AV Area VTI: 1.71 cm2
AV Area mean vel: 1.85 cm2
AV Mean grad: 9 mmHg
AV Peak grad: 19.5 mmHg
Ao pk vel: 2.21 m/s
Area-P 1/2: 2.22 cm2
S' Lateral: 2.7 cm

## 2021-01-28 MED ORDER — PERFLUTREN LIPID MICROSPHERE
1.0000 mL | INTRAVENOUS | Status: AC | PRN
  Administered 2021-01-28 (×2): 3 mL via INTRAVENOUS
  Administered 2021-01-28: 2 mL via INTRAVENOUS

## 2021-01-28 NOTE — Patient Instructions (Addendum)
Medication Instructions:  Your provider recommends that you continue on your current medications as directed. Please refer to the Current Medication list given to you today.   *If you need a refill on your cardiac medications before your next appointment, please call your pharmacy*  Testing/Procedures: Joellen Jersey recommends you have an ABDOMINAL CT.  Follow-Up: Please keep your appointment with Dr. Sallyanne Kuster.

## 2021-01-29 ENCOUNTER — Other Ambulatory Visit: Payer: Self-pay | Admitting: Physician Assistant

## 2021-01-29 ENCOUNTER — Other Ambulatory Visit: Payer: Self-pay | Admitting: Cardiovascular Disease

## 2021-01-29 ENCOUNTER — Other Ambulatory Visit: Payer: Self-pay | Admitting: Family Medicine

## 2021-01-29 DIAGNOSIS — I10 Essential (primary) hypertension: Secondary | ICD-10-CM

## 2021-01-29 DIAGNOSIS — E785 Hyperlipidemia, unspecified: Secondary | ICD-10-CM

## 2021-01-29 DIAGNOSIS — E1169 Type 2 diabetes mellitus with other specified complication: Secondary | ICD-10-CM

## 2021-01-29 NOTE — Progress Notes (Signed)
Thanks, I'll follow up on it. She needs periodic CT angios for her aorta any way.

## 2021-02-11 ENCOUNTER — Telehealth: Payer: Self-pay

## 2021-02-11 NOTE — Telephone Encounter (Signed)
The patient reports someone called and told her she needed to schedule her CT prior to seeing [name].   At the last visit with [name], the patient reported she wanted to see [name] first and discuss findings prior to scheduling CT. She would still like to follow that plan.  Cancelled CT scheduled 4/1. If the patient and [name] decide together follow-up scan needs to be completed, she will have BMET drawn at that visit and proceed with scheduling. She was grateful for assistance.  Note placed on CT not to schedule until her visit with [name] is complete.

## 2021-02-11 NOTE — Telephone Encounter (Signed)
Patient returning call.

## 2021-02-11 NOTE — Telephone Encounter (Signed)
Received message to arrange BMET prior to upcoming CT.  The patient reported at [facility]'s last visit that she wanted to wait to speak with Dr. Sallyanne Kuster prior to having CT, but the CT is scheduled before that visit.  Called patient to clarify plan.  Left message to call back.

## 2021-02-11 NOTE — Telephone Encounter (Signed)
Thanks! Hope to see you in person again ...some day!

## 2021-02-18 ENCOUNTER — Other Ambulatory Visit: Payer: Self-pay | Admitting: Family Medicine

## 2021-02-18 DIAGNOSIS — F419 Anxiety disorder, unspecified: Secondary | ICD-10-CM

## 2021-02-18 NOTE — Telephone Encounter (Signed)
Requesting: alprazolam 0.5mg  Contract: 09/02/2020 UDS: 09/02/2020 Last Visit: 09/02/2020 Next Visit: 03/09/2021 Last Refill: 01/23/2021 #90 and 0RF  Please Advise

## 2021-02-19 NOTE — Telephone Encounter (Signed)
Patient calling in reference to medication refill for xanax patient would like a refill sent to pharm asap please

## 2021-02-23 ENCOUNTER — Telehealth: Payer: Self-pay | Admitting: Cardiovascular Disease

## 2021-02-23 ENCOUNTER — Ambulatory Visit (INDEPENDENT_AMBULATORY_CARE_PROVIDER_SITE_OTHER): Payer: Medicare HMO

## 2021-02-23 DIAGNOSIS — I472 Ventricular tachycardia, unspecified: Secondary | ICD-10-CM

## 2021-02-23 LAB — CUP PACEART REMOTE DEVICE CHECK
Battery Remaining Longevity: 84 mo
Battery Voltage: 2.99 V
Brady Statistic AP VP Percent: 99.65 %
Brady Statistic AP VS Percent: 0.02 %
Brady Statistic AS VP Percent: 0.33 %
Brady Statistic AS VS Percent: 0 %
Brady Statistic RA Percent Paced: 99.66 %
Brady Statistic RV Percent Paced: 99.97 %
Date Time Interrogation Session: 20220328022723
HighPow Impedance: 76 Ohm
Implantable Lead Implant Date: 20071016
Implantable Lead Implant Date: 20071016
Implantable Lead Implant Date: 20200730
Implantable Lead Location: 753859
Implantable Lead Location: 753860
Implantable Lead Location: 753860
Implantable Lead Model: 4092
Implantable Lead Model: 5594
Implantable Lead Model: 6935
Implantable Pulse Generator Implant Date: 20200730
Lead Channel Impedance Value: 380 Ohm
Lead Channel Impedance Value: 494 Ohm
Lead Channel Impedance Value: 551 Ohm
Lead Channel Pacing Threshold Amplitude: 0.625 V
Lead Channel Pacing Threshold Amplitude: 0.625 V
Lead Channel Pacing Threshold Pulse Width: 0.4 ms
Lead Channel Pacing Threshold Pulse Width: 0.4 ms
Lead Channel Sensing Intrinsic Amplitude: 26.375 mV
Lead Channel Sensing Intrinsic Amplitude: 26.375 mV
Lead Channel Sensing Intrinsic Amplitude: 3.25 mV
Lead Channel Sensing Intrinsic Amplitude: 3.25 mV
Lead Channel Setting Pacing Amplitude: 1.5 V
Lead Channel Setting Pacing Amplitude: 2.5 V
Lead Channel Setting Pacing Pulse Width: 0.4 ms
Lead Channel Setting Sensing Sensitivity: 0.3 mV

## 2021-02-23 NOTE — Telephone Encounter (Signed)
1. .What dental office are you calling from? [facility]   2. What is your office phone number? [phone]  3. What is your fax number? [phone]  4. What type of procedure is the patient having performed? Deep dental cleaning & dental procedures with the use of local dental anthestia epinephrine   5. What date is procedure scheduled or is the patient there now? 02/24/21 (if the patient is at the dentist's office question goes to their cardiologist if he/she is in the office.  If not, question should go to the DOD).   6. What is your question (ex. Antibiotics prior to procedure, holding medication-we need to know how long dentist wants pt to hold med)? What antibiotics are needed and if medications need to be held prior

## 2021-02-23 NOTE — Telephone Encounter (Signed)
   Primary Cardiologist: Sanda Klein, MD  Chart reviewed as part of pre-operative protocol coverage. Given past medical history and time since last visit, based on ACC/AHA guidelines, [name] would be at acceptable risk for the planned dental.   Due to her hx of TAVR procedure, she will need to take Amoxicillin 2gm 1 hour prior to dental procedure, including cleaning. Per medication review, she should already have this medication available prior to her appointment.   I will route this recommendation to the requesting party via Epic fax function and remove from pre-op pool.  Please call with questions.  Kathyrn Drown, NP 02/23/2021, 2:52 PM

## 2021-02-27 ENCOUNTER — Inpatient Hospital Stay: Admission: RE | Admit: 2021-02-27 | Payer: Medicare HMO | Source: Ambulatory Visit

## 2021-03-02 ENCOUNTER — Other Ambulatory Visit: Payer: Self-pay

## 2021-03-02 ENCOUNTER — Encounter: Payer: Self-pay | Admitting: Cardiovascular Disease

## 2021-03-02 ENCOUNTER — Ambulatory Visit (INDEPENDENT_AMBULATORY_CARE_PROVIDER_SITE_OTHER): Payer: Medicare HMO | Admitting: Cardiovascular Disease

## 2021-03-02 VITALS — BP 130/78 | HR 64 | Ht 60.0 in | Wt 185.0 lb

## 2021-03-02 DIAGNOSIS — I4892 Unspecified atrial flutter: Secondary | ICD-10-CM | POA: Diagnosis not present

## 2021-03-02 DIAGNOSIS — I495 Sick sinus syndrome: Secondary | ICD-10-CM | POA: Diagnosis not present

## 2021-03-02 DIAGNOSIS — E669 Obesity, unspecified: Secondary | ICD-10-CM

## 2021-03-02 DIAGNOSIS — Z95810 Presence of automatic (implantable) cardiac defibrillator: Secondary | ICD-10-CM

## 2021-03-02 DIAGNOSIS — E785 Hyperlipidemia, unspecified: Secondary | ICD-10-CM

## 2021-03-02 DIAGNOSIS — E1169 Type 2 diabetes mellitus with other specified complication: Secondary | ICD-10-CM

## 2021-03-02 DIAGNOSIS — I7101 Dissection of thoracic aorta: Secondary | ICD-10-CM

## 2021-03-02 DIAGNOSIS — I71019 Dissection of thoracic aorta, unspecified: Secondary | ICD-10-CM

## 2021-03-02 DIAGNOSIS — I10 Essential (primary) hypertension: Secondary | ICD-10-CM | POA: Diagnosis not present

## 2021-03-02 DIAGNOSIS — I472 Ventricular tachycardia, unspecified: Secondary | ICD-10-CM

## 2021-03-02 DIAGNOSIS — I5032 Chronic diastolic (congestive) heart failure: Secondary | ICD-10-CM

## 2021-03-02 DIAGNOSIS — E278 Other specified disorders of adrenal gland: Secondary | ICD-10-CM

## 2021-03-02 DIAGNOSIS — Z952 Presence of prosthetic heart valve: Secondary | ICD-10-CM

## 2021-03-02 DIAGNOSIS — Z6835 Body mass index (BMI) 35.0-35.9, adult: Secondary | ICD-10-CM

## 2021-03-02 LAB — BASIC METABOLIC PANEL
BUN/Creatinine Ratio: 25 (ref 12–28)
BUN: 29 mg/dL — ABNORMAL HIGH (ref 8–27)
CO2: 20 mmol/L (ref 20–29)
Calcium: 9.6 mg/dL (ref 8.7–10.3)
Chloride: 106 mmol/L (ref 96–106)
Creatinine, Ser: 1.16 mg/dL — ABNORMAL HIGH (ref 0.57–1.00)
Glucose: 136 mg/dL — ABNORMAL HIGH (ref 65–99)
Potassium: 5.1 mmol/L (ref 3.5–5.2)
Sodium: 141 mmol/L (ref 134–144)
eGFR: 49 mL/min/{1.73_m2} — ABNORMAL LOW (ref >59)

## 2021-03-02 LAB — PACEMAKER DEVICE OBSERVATION

## 2021-03-02 NOTE — Progress Notes (Signed)
Cardiology Office Note    Date:  03/05/2021   ID:  [name], DOB 1946/08/13, MRN [id number]  PCP:  Carollee Herter, Alferd Apa, DO  Cardiologist:   Sanda Klein, MD   Chief Complaint  Patient presents with  . Thoracic Aortic Dissection  . Cardiac Valve Problem  . Pacemaker Problem    History of Present Illness:  [name] is a 75 y.o. female with a history of repaired type A aortic dissection (resuspended native aortic valve, persistent intimal flap involving the aortic arch, left subclavian artery and abdominal aorta), subsequent development of severe paradoxical low flow low gradient AS/AI and TAVR with 74mm Edwards Sapien 3 UltraTHV via the TAapproach on 01/29/20, chronic predominantly diastolic heart failure (EF 45%), paroxysmal monomorphic ventricular tachycardia status post ICD implantation August 2020 (Medtronic [place]), pre-existing sinus node dysfunction with sinus node arrest, "atrially-pacemaker dependent", atypical LBBB with very long AV conduction time and 100% V pacing, history of morbid obesity, diabetes mellitus, hypertension.  She had normal coronary arteries by CT angiography in August 2020.  The patient specifically denies any chest pain at rest exertion, dyspnea at rest or with exertion, orthopnea, paroxysmal nocturnal dyspnea, syncope, palpitations, focal neurological deficits, intermittent claudication, lower extremity edema, unexplained weight gain, cough, hemoptysis or wheezing.  Her pacemaker was upgraded to ICD in August 2020. AAIR-DDDR programming was associated with only 20% V pacing, but MVP related pauses were associated with long-short sequences and precipitation of polymorphic VT. Reprogrammed DDDR, she has 100% V pacing and a subsequent slight reduction in LVEF, but without exacerbation of clinical CHF. No recurrent VT. Last saw Dr. Lovena Le in March 2021, when her AV delay was tightened to a physiological range.   Last remote download 02/23/2021,  virtually 100% APVP rhythm. No VT or AFib seen.  Activity level has been relatively low at 0.7 hours a day, but stable.  Her OptiVol showed a brief period of increase in fluid that coincides with the a week vacation at the beach, but has returned to baseline.  Heart rate histogram distribution appears appropriate.  Generator longevity is estimated to be 6.9 years.  There was no underlying atrial or ventricular rhythm when pacing was taken down to 30 bpm.  Is upset because her new dentist did not want to provide the "deep cleaning" that she needs because of her medical risk.  She is looking for a new dentist.  [name] is generally doing well, has been very successful with diet changes and weight loss. The patient specifically denies any chest pain at rest exertion, dyspnea at rest or with exertion, orthopnea, paroxysmal nocturnal dyspnea, syncope, palpitations, focal neurological deficits, intermittent claudication, lower extremity edema, unexplained weight gain, cough, hemoptysis or wheezing.  She has not had any serious bleeding problems but does complain of easy and severe bruising.   She had a structural heart team visit and a follow up echo in on 01/28/2021. LV EF 45-50%, mean TAVR gradient 9 mm Hg, no leak.  Improved estimation of estimated SPAP at 28 mmHg with normal right atrial pressure (previous echo showed PAH at 53 mm Hg).  Follow-up CT was recommended to evaluate the right adrenal nodule noticed on her pre-TAVR CTs.  She is on high-dose carvedilol as well as a relatively low dose of losartan and Jardiance for CHF.  She takes furosemide and very low-dose.  On statin.  Past Medical History:  Diagnosis Date  . Acute thoracic aortic dissection [facility]) 2008   emergency surgery - [place]  .  AICD (automatic cardioverter/defibrillator) present   . Anxiety   . Cancer ([employer])    skin  . CKD (chronic kidney disease) stage 3, GFR 30-59 ml/min (HCC)   . Diabetes mellitus   . Diverticulosis   . GERD  (gastroesophageal reflux disease)   . Hemorrhoids   . History of kidney stones   . Hyperlipidemia   . Hypertension   . Kidney stones   . S/P TAVR (transcatheter aortic valve replacement) 01/29/2020   s/p TAVR with a 23 mm Edwards Sapien 3 Ultra via the TA approach wtih Dr. Burt Knack and Dr. Cyndia Bent  . Severe aortic stenosis   . Sinus node dysfunction (HCC)    hx of PPM  . Sustained ventricular tachycardia ([place])    s/p ICD 05/2019    Past Surgical History:  Procedure Laterality Date  . ABDOMINAL HYSTERECTOMY  1984  . APPENDECTOMY    . EYE SURGERY  2014   Rght eye  . ICD IMPLANT N/A 06/28/2019   Procedure: PPM upgrade to ICD;  Surgeon: Evans Lance, MD;  Location: [place] CV LAB;  Service: Cardiovascular;  Laterality: N/A;  . PACEMAKER GENERATOR CHANGE  2014   Medtronic adapta  . PACEMAKER GENERATOR CHANGE N/A 08/29/2013   Procedure: PACEMAKER GENERATOR CHANGE;  Surgeon: Sanda Klein, MD;  Location: [place] CATH LAB;  Service: Cardiovascular;  Laterality: N/A;  . PACEMAKER INSERTION  08/2006   dual chamber Medtronic EnRhythm; r/t sinus node dysfunction   . REPAIR OF ACUTE ASCENDING THORACIC AORTIC DISSECTION  2008   Dr. Servando Snare  . RIGHT/LEFT HEART CATH AND CORONARY ANGIOGRAPHY N/A 01/09/2020   Procedure: RIGHT/LEFT HEART CATH AND CORONARY ANGIOGRAPHY;  Surgeon: Sherren Mocha, MD;  Location: [place] CV LAB;  Service: Cardiovascular;  Laterality: N/A;  . TEE WITHOUT CARDIOVERSION N/A 01/29/2020   Procedure: TRANSESOPHAGEAL ECHOCARDIOGRAM (TEE);  Surgeon: Sherren Mocha, MD;  Location: [place];  Service: Open Heart Surgery;  Laterality: N/A;  . THORACIC AORTIC ANEURYSM REPAIR  2000   type III  . TRANSCATHETER AORTIC VALVE REPLACEMENT, TRANSAPICAL N/A 01/29/2020   Procedure: TRANSCATHETER AORTIC VALVE REPLACEMENT, TRANSAPICAL using a Edwards Sapien 3, 63mm  Transcatheter Heart Valve;  Surgeon: Sherren Mocha, MD;  Location: [place];  Service: Open Heart Surgery;  Laterality: N/A;  .  TRANSTHORACIC ECHOCARDIOGRAM  09/20/2012   EF=>55% with mild conc LVH; LA mod dilated; RA mildly dilated; mild MR/TR/AR    Current Medications: Outpatient Medications Prior to Visit  Medication Sig Dispense Refill  . ALPRAZolam (XANAX) 0.5 MG tablet TAKE 1 TABLET BY MOUTH THREE TIMES A DAY AS NEEDED FOR ANXIETY 90 tablet 0  . amoxicillin (AMOXIL) 500 MG tablet Take 4 tablets (2,000 mg total) by mouth as directed. 1 hour prior to dental work including cleanings 12 tablet 12  . Ascorbic Acid (VITAMIN C) 1000 MG tablet Take 1,000 mg by mouth daily.    . carvedilol (COREG) 25 MG tablet TAKE 1 TABLET BY MOUTH TWICE A DAY WITH MEALS 180 tablet 1  . citalopram (CELEXA) 20 MG tablet Take 3 tablets (60 mg total) by mouth daily. 270 tablet 1  . CVS ASPIRIN ADULT LOW DOSE 81 MG chewable tablet CHEW 1 TABLET BY MOUTH DAILY. 90 tablet 0  . fluticasone (FLONASE) 50 MCG/ACT nasal spray USE 2 SPRAYS IN EACH NOSTIRL EVERY DAY 16 g 1  . furosemide (LASIX) 20 MG tablet TAKE 1 TABLET BY MOUTH EVERY DAY 90 tablet 3  . furosemide (LASIX) 40 MG tablet TAKE 1/2 TABLET BY MOUTH  DAILY 45 tablet 3  . JARDIANCE 10 MG TABS tablet TAKE 1 TABLET(10 MG) BY MOUTH DAILY. 90 tablet 1  . losartan (COZAAR) 25 MG tablet TAKE 1/2 TABLET BY MOUTH AT BEDTIME. 45 tablet 7  . Omega-3 Fatty Acids (FISH OIL) 1000 MG CAPS Take 1,000 mg by mouth daily.     . Potassium Chloride ER 20 MEQ TBCR TAKE 1 TABLET BY MOUTH EVERY DAY 90 tablet 4  . Probiotic Product (PRO-BIOTIC BLEND PO) Take 1 tablet by mouth daily.     . rosuvastatin (CRESTOR) 20 MG tablet Take 1 tablet (20 mg total) by mouth daily. 90 tablet 1   Facility-Administered Medications Prior to Visit  Medication Dose Route Frequency Provider Last Rate Last Admin  . diclofenac sodium (VOLTAREN) 1 % transdermal gel 2 g  2 g Topical QID Carollee Herter, Yvonne R, DO         Allergies:   Oxycodone, Vicodin [hydrocodone-acetaminophen], Latex, Neomycin-bacitracin zn-polymyx, and Sulfonamide  derivatives   Social History   Socioeconomic History  . Marital status: Widowed    Spouse name: Not on file  . Number of children: 2  . Years of education: Not on file  . Highest education level: Not on file  Occupational History    Employer: RETIRED  Tobacco Use  . Smoking status: Former Smoker    Quit date: 09/04/1993    Years since quitting: 27.5  . Smokeless tobacco: Never Used  Vaping Use  . Vaping Use: Never used  Substance and Sexual Activity  . Alcohol use: No  . Drug use: No  . Sexual activity: Not Currently  Other Topics Concern  . Not on file  Social History Narrative   Lives at home and husband was just moved to [place]   Social Determinants of Health   Financial Resource Strain: Not on file  Food Insecurity: Not on file  Transportation Needs: Not on file  Physical Activity: Not on file  Stress: Not on file  Social Connections: Not on file     Family History:  The patient's family history includes Heart attack in her father and maternal grandmother; Kidney disease in her mother; Valvular heart disease in her son.   ROS:   Please see the history of present illness.    ROS All other systems are reviewed and are negative.   PHYSICAL EXAM:   VS:  BP 130/78   Pulse 64   Ht 5' (1.524 m)   Wt 185 lb (83.9 kg)   SpO2 98%   BMI 36.13 kg/m      General: Alert, oriented x3, no distress, moderate to severely obese.  Healthy defibrillator site. Head: no evidence of trauma, PERRL, EOMI, no exophtalmos or lid lag, no myxedema, no xanthelasma; normal ears, nose and oropharynx Neck: normal jugular venous pulsations and no hepatojugular reflux; brisk carotid pulses without delay and no carotid bruits Chest: clear to auscultation, no signs of consolidation by percussion or palpation, normal fremitus, symmetrical and full respiratory excursions Cardiovascular: normal position and quality of the apical impulse, regular rhythm, normal first and second heart sounds,  0-2/7 no diastolic murmurs, rubs or gallops Abdomen: no tenderness or distention, no masses by palpation, no abnormal pulsatility or arterial bruits, normal bowel sounds, no hepatosplenomegaly Extremities: no clubbing, cyanosis or edema; 2+ radial, ulnar and brachial pulses bilaterally; 2+ right femoral, posterior tibial and dorsalis pedis pulses; 2+ left femoral, posterior tibial and dorsalis pedis pulses; no subclavian or femoral bruits Neurological: grossly nonfocal Psych:  Normal mood and affect    Wt Readings from Last 3 Encounters:  03/02/21 185 lb (83.9 kg)  01/28/21 184 lb 12.8 oz (83.8 kg)  09/02/20 170 lb 6.4 oz (77.3 kg)      Studies/Labs Reviewed:   ECHO 01/28/2021:  1. Left ventricular ejection fraction, by estimation, is 45 to 50%. The left ventricle has mildly decreased function. The left ventricle demonstrates regional wall motion abnormalities. Abnormal (paradoxical) septal motion, consistent with RV pacemaker. There is moderate asymmetric left ventricular hypertrophy of the basal-septal segment. Left ventricular diastolic parameters are consistent with Grade I diastolic dysfunction (impaired relaxation). 2. Right ventricular systolic function is mildly reduced. The right ventricular size is normal. 3. Right atrial size was mildly dilated. 4. The mitral valve is normal in structure. Trivial mitral valve regurgitation. No evidence of mitral stenosis. 5. There is a 23 mm Sapien prosthetic, stented (TAVR) valve present in the aortic position. Aortic valve regurgitation is not visualized. Echo findings are consistent with normal structure and function of the aortic valve prosthesis. Aortic valve mean gradient measures 9.0 mmHg.  EKG:  EKG is ordered today.  It shows AV sequential pacing with a distinct positive R wave in lead V1 QTC 493 ms  Recent Labs: 09/02/2020: ALT 15 03/02/2021: BUN 29; Creatinine, Ser 1.16; Potassium 5.1; Sodium 141   Lipid Panel    Component  Value Date/Time   CHOL 134 09/02/2020 1134   TRIG 181 (H) 09/02/2020 1134   HDL 43 (L) 09/02/2020 1134   CHOLHDL 3.1 09/02/2020 1134   VLDL 21.4 06/28/2017 1446   LDLCALC 65 09/02/2020 1134   LDLDIRECT 217.7 09/13/2011 1535     ASSESSMENT:    1. Adrenal nodule ([place])   2. Chronic diastolic congestive heart failure ([place])   3. S/P TAVR (transcatheter aortic valve replacement)   4. SSS (sick sinus syndrome) ([place])   5. ICD (implantable cardioverter-defibrillator) in place   6. Ventricular tachycardia (HCC)   7. Paroxysmal atrial flutter ([place])   8. Dissection of aorta, thoracic ([place])   9. Essential hypertension   10. Hyperlipidemia LDL goal <100   11. Diabetes mellitus type 2 in obese ([place])   12. Severe obesity (BMI 35.0-35.9 with comorbidity) ([place])      PLAN:  In order of problems listed above:  1. CHF: Well compensated.  Transient deviation and OptiVol level likely due to increased sodium intake during holiday trip, now improving.  NYHA functional class I.  Requiring only a very low-dose of diuretic.  No change in medications. 2. AS s/p transapical TAVR: Aware of the need for endocarditis prophylaxis.  Doing very well clinically since TAVR. 3. SSS/AV block: She is pacemaker dependent.  Attempts to diminish ventricular pacing by using MVP exposure to the risk of torsades de pointes due to ventricular pauses.  Acceptable heart rate histogram distribution with current sensor settings. 4. ICD: Normal device function, followed by Dr. Lovena Le. 5. VT: It has been almost 2 years since her episode of VT, without recurrence. 6. AFlutter: Occurred in the distant past and has not been seen in over 6 years of device monitoring. 7. Hx Ao dissection: Plan to continue yearly follow-up with CT angiography.  This will also allow [name] to evaluate her right adrenal nodule. 8. HTN: Very well controlled.  As she is gaining weight back, we may need to reincrease the dose of her ARB. 9. HLP: Target LDL less  than 100. According to Dr. Durwin Nora Little's report, her coronaries were "spotless" at angiography  in 2008. No sign of atherosclerotic calcifications seen on serial CTs of the chest and abdomen 10. DM: Most recent A1c was exceptional at 5.9 %.  On Jardiance monotherapy. 11. Obesity: Unfortunate she has gained back some weight.  Encouraged her to try to lose weight and she appears to be truly committed to this.     Medication Adjustments/Labs and Tests Ordered: Current medicines are reviewed at length with the patient today.  Concerns regarding medicines are outlined above.  Medication changes, Labs and Tests ordered today are listed in the Patient Instructions below. Patient Instructions  Medication Instructions:  No changes *If you need a refill on your cardiac medications before your next appointment, please call your pharmacy*   Lab Work: Your provider would like for you to have the following labs today: BMET  If you have labs (blood work) drawn today and your tests are completely normal, you will receive your results only by: [name] MyChart Message (if you have MyChart) OR . A paper copy in the mail If you have any lab test that is abnormal or we need to change your treatment, we will call you to review the results.   Testing/Procedures: Dr. Sallyanne Kuster has ordered a CT of the Abdomen, Pelvis, and Chest   Follow-Up: At [facility], you and your health needs are our priority.  As part of our continuing mission to provide you with exceptional heart care, we have created designated Provider Care Teams.  These Care Teams include your primary Cardiologist (physician) and Advanced Practice Providers (APPs -  Physician Assistants and Nurse Practitioners) who all work together to provide you with the care you need, when you need it.  We recommend signing up for the patient portal called "MyChart".  Sign up information is provided on this After Visit Summary.  MyChart is used to connect with patients  for Virtual Visits (Telemedicine).  Patients are able to view lab/test results, encounter notes, upcoming appointments, etc.  Non-urgent messages can be sent to your provider as well.   To learn more about what you can do with MyChart, go to [email or web address].    Your next appointment:   12 month(s)  The format for your next appointment:   In Person  Provider:   Sanda Klein, MD       Signed, Sanda Klein, MD  03/05/2021 8:03 AM    [address], [place], [place]  [address] Phone: [phone]; Fax: [phone]

## 2021-03-02 NOTE — Patient Instructions (Signed)
Medication Instructions:  No changes *If you need a refill on your cardiac medications before your next appointment, please call your pharmacy*   Lab Work: Your provider would like for you to have the following labs today: BMET  If you have labs (blood work) drawn today and your tests are completely normal, you will receive your results only by: [name] MyChart Message (if you have MyChart) OR . A paper copy in the mail If you have any lab test that is abnormal or we need to change your treatment, we will call you to review the results.   Testing/Procedures: Dr. Sallyanne Kuster has ordered a CT of the Abdomen, Pelvis, and Chest   Follow-Up: At [facility], you and your health needs are our priority.  As part of our continuing mission to provide you with exceptional heart care, we have created designated Provider Care Teams.  These Care Teams include your primary Cardiologist (physician) and Advanced Practice Providers (APPs -  Physician Assistants and Nurse Practitioners) who all work together to provide you with the care you need, when you need it.  We recommend signing up for the patient portal called "MyChart".  Sign up information is provided on this After Visit Summary.  MyChart is used to connect with patients for Virtual Visits (Telemedicine).  Patients are able to view lab/test results, encounter notes, upcoming appointments, etc.  Non-urgent messages can be sent to your provider as well.   To learn more about what you can do with MyChart, go to [email or web address].    Your next appointment:   12 month(s)  The format for your next appointment:   In Person  Provider:   Sanda Klein, MD

## 2021-03-03 ENCOUNTER — Telehealth: Payer: Self-pay | Admitting: Cardiovascular Disease

## 2021-03-03 NOTE — Telephone Encounter (Signed)
Spoke with patientregarding the Friday 03/06/21 1:30 pm CTA chest/ abe/pelvis scheduled at [facility] time is 1:00pm--1st floor radiology for check in---liquids only 4 hours prior to study---patient voiced her understanding.

## 2021-03-04 ENCOUNTER — Other Ambulatory Visit: Payer: Self-pay | Admitting: Family Medicine

## 2021-03-06 ENCOUNTER — Other Ambulatory Visit: Payer: Self-pay

## 2021-03-06 ENCOUNTER — Ambulatory Visit (HOSPITAL_COMMUNITY)
Admission: RE | Admit: 2021-03-06 | Discharge: 2021-03-06 | Disposition: A | Discharge status: Home / Self Care | Payer: Medicare HMO | Source: Ambulatory Visit | Attending: Cardiovascular Disease | Admitting: Cardiovascular Disease

## 2021-03-06 DIAGNOSIS — J9811 Atelectasis: Secondary | ICD-10-CM | POA: Diagnosis not present

## 2021-03-06 DIAGNOSIS — I701 Atherosclerosis of renal artery: Secondary | ICD-10-CM | POA: Diagnosis not present

## 2021-03-06 DIAGNOSIS — D3501 Benign neoplasm of right adrenal gland: Secondary | ICD-10-CM | POA: Diagnosis not present

## 2021-03-06 DIAGNOSIS — I7772 Dissection of iliac artery: Secondary | ICD-10-CM | POA: Diagnosis not present

## 2021-03-06 DIAGNOSIS — I71019 Dissection of thoracic aorta, unspecified: Secondary | ICD-10-CM

## 2021-03-06 DIAGNOSIS — I7101 Dissection of thoracic aorta: Secondary | ICD-10-CM | POA: Insufficient documentation

## 2021-03-06 DIAGNOSIS — E278 Other specified disorders of adrenal gland: Secondary | ICD-10-CM | POA: Insufficient documentation

## 2021-03-06 DIAGNOSIS — I700 Atherosclerosis of aorta: Secondary | ICD-10-CM | POA: Diagnosis not present

## 2021-03-06 DIAGNOSIS — K5730 Diverticulosis of large intestine without perforation or abscess without bleeding: Secondary | ICD-10-CM | POA: Diagnosis not present

## 2021-03-06 MED ORDER — IOHEXOL 350 MG/ML SOLN
100.0000 mL | Freq: Once | INTRAVENOUS | Status: AC | PRN
  Administered 2021-03-06: 100 mL via INTRAVENOUS

## 2021-03-09 ENCOUNTER — Encounter: Payer: Medicare HMO | Admitting: Family Medicine

## 2021-03-09 ENCOUNTER — Other Ambulatory Visit: Payer: Self-pay | Admitting: *Deleted

## 2021-03-09 DIAGNOSIS — I7101 Dissection of thoracic aorta: Secondary | ICD-10-CM

## 2021-03-09 DIAGNOSIS — I71019 Dissection of thoracic aorta, unspecified: Secondary | ICD-10-CM

## 2021-03-09 NOTE — Progress Notes (Signed)
Remote ICD transmission.   

## 2021-03-10 ENCOUNTER — Other Ambulatory Visit: Payer: Self-pay | Admitting: Physician Assistant

## 2021-03-18 ENCOUNTER — Other Ambulatory Visit: Payer: Self-pay | Admitting: *Deleted

## 2021-03-18 DIAGNOSIS — I71019 Dissection of thoracic aorta, unspecified: Secondary | ICD-10-CM

## 2021-03-18 DIAGNOSIS — I7101 Dissection of thoracic aorta: Secondary | ICD-10-CM

## 2021-03-19 ENCOUNTER — Telehealth: Payer: Self-pay | Admitting: Cardiovascular Disease

## 2021-03-19 DIAGNOSIS — I71019 Dissection of thoracic aorta, unspecified: Secondary | ICD-10-CM

## 2021-03-19 DIAGNOSIS — I7101 Dissection of thoracic aorta: Secondary | ICD-10-CM

## 2021-03-19 NOTE — Telephone Encounter (Signed)
Order for repeat in 1 year Order corrected  Left Prentiss Bells message to make aware to disregard and order has been cancelled and corrected.

## 2021-03-19 NOTE — Telephone Encounter (Signed)
[name] from [facility] is calling in regards to an order placed for pt. [facility] does not do this particular image CT Angio Chest Abd Pelvis for dissection With or Without Contract Please advise

## 2021-03-20 ENCOUNTER — Other Ambulatory Visit: Payer: Self-pay | Admitting: Family Medicine

## 2021-03-20 DIAGNOSIS — F419 Anxiety disorder, unspecified: Secondary | ICD-10-CM

## 2021-03-20 NOTE — Telephone Encounter (Signed)
Requesting: alprazolam 0.5mg  Contract: 09/02/2020 UDS: 09/02/2020 Last Visit: 09/02/2020 Next Visit:  04/07/21 Last Refill: 02/19/21 #90 and 0RF Pt sig: 1 tab tid prn  Please Advise

## 2021-03-26 ENCOUNTER — Other Ambulatory Visit: Payer: Self-pay | Admitting: Physician Assistant

## 2021-03-26 NOTE — Telephone Encounter (Signed)
Pt's pharmacy is requesting a refill on amoxicillin. Would Dr. Burt Knack like to refill this medication for dental work? Please address

## 2021-04-07 ENCOUNTER — Encounter: Payer: Medicare HMO | Admitting: Family Medicine

## 2021-04-16 ENCOUNTER — Other Ambulatory Visit (HOSPITAL_COMMUNITY): Payer: Self-pay | Admitting: Internal Medicine

## 2021-04-16 ENCOUNTER — Telehealth: Payer: Self-pay | Admitting: Cardiovascular Disease

## 2021-04-16 MED ORDER — EMPAGLIFLOZIN 10 MG PO TABS: ORAL_TABLET | ORAL | 1 refills | Status: DC

## 2021-04-16 NOTE — Telephone Encounter (Signed)
*  STAT* If patient is at the pharmacy, call can be transferred to refill team.   1. Which medications need to be refilled? (please list name of each medication and dose if known) JARDIANCE 10 MG TABS tablet  2. Which pharmacy/location (including street and city if local pharmacy) is medication to be sent to? [id number] - [place], [place] - [address]  3. Do they need a 30 day or 90 day supply? 90   **URGENT** PATIENT HAS BEEN OUT FOR 2 DAYS, SHE HAS BEEN RELEASED BY DR. Sung Amabile

## 2021-04-23 ENCOUNTER — Other Ambulatory Visit: Payer: Self-pay | Admitting: Family Medicine

## 2021-04-23 ENCOUNTER — Encounter: Payer: Self-pay | Admitting: Family Medicine

## 2021-04-23 DIAGNOSIS — F419 Anxiety disorder, unspecified: Secondary | ICD-10-CM

## 2021-04-23 MED ORDER — ALPRAZOLAM 0.5 MG PO TABS: ORAL_TABLET | ORAL | 0 refills | Status: DC

## 2021-04-23 NOTE — Telephone Encounter (Addendum)
Requesting: alprazolam Contract: 09/12/20 UDS: 09/12/20 Last Visit: 09/12/20 Next Visit:04/30/21   Last Refill: 03/24/21   Please Advise

## 2021-04-23 NOTE — Telephone Encounter (Signed)
Requesting: Xanax Contract: 09/02/2020 UDS: 09/02/2020 Last OV: 09/02/2020 virtual Next OV: 04/30/2021 Last Refill: 03/24/2021, #90--0 RF Database:   Please advise

## 2021-04-23 NOTE — Telephone Encounter (Signed)
Patient checking the status

## 2021-04-24 ENCOUNTER — Telehealth: Payer: Self-pay | Admitting: *Deleted

## 2021-04-24 NOTE — Chronic Care Management (AMB) (Signed)
  Chronic Care Management   Note  04/24/2021 Name: [name] MRN: [id number] DOB: 1945-12-22  [name] is a 75 y.o. year old female who is a primary care patient of [name], DO. I reached out to [name] by phone today in response to a referral sent by Ms. Trinidad Curet Calvo's PCP, Carollee Herter, Alferd Apa, DO.     [name] was given information about Chronic Care Management services today including:  1. CCM service includes personalized support from designated clinical staff supervised by her physician, including individualized plan of care and coordination with other care providers 2. 24/7 contact phone numbers for assistance for urgent and routine care needs. 3. Service will only be billed when office clinical staff spend 20 minutes or more in a month to coordinate care. 4. Only one practitioner may furnish and bill the service in a calendar month. 5. The patient may stop CCM services at any time (effective at the end of the month) by phone call to the office staff. 6. The patient will be responsible for cost sharing (co-pay) of up to 20% of the service fee (after annual deductible is met).  Patient agreed to services and verbal consent obtained.   Follow up plan: Telephone appointment with care management team member scheduled for:05/14/2021  [facility]  Direct Dial: [phone]

## 2021-04-29 ENCOUNTER — Other Ambulatory Visit: Payer: Self-pay

## 2021-04-30 ENCOUNTER — Other Ambulatory Visit: Payer: Self-pay

## 2021-04-30 ENCOUNTER — Ambulatory Visit (INDEPENDENT_AMBULATORY_CARE_PROVIDER_SITE_OTHER): Payer: Medicare HMO | Admitting: Family Medicine

## 2021-04-30 ENCOUNTER — Ambulatory Visit: Payer: Medicare HMO | Attending: Internal Medicine

## 2021-04-30 ENCOUNTER — Encounter: Payer: Self-pay | Admitting: Family Medicine

## 2021-04-30 VITALS — BP 126/84 | HR 80 | Temp 98.7°F | Resp 18 | Ht 61.0 in | Wt 189.8 lb

## 2021-04-30 DIAGNOSIS — Z0000 Encounter for general adult medical examination without abnormal findings: Secondary | ICD-10-CM

## 2021-04-30 DIAGNOSIS — K580 Irritable bowel syndrome with diarrhea: Secondary | ICD-10-CM

## 2021-04-30 DIAGNOSIS — E785 Hyperlipidemia, unspecified: Secondary | ICD-10-CM

## 2021-04-30 DIAGNOSIS — I10 Essential (primary) hypertension: Secondary | ICD-10-CM

## 2021-04-30 DIAGNOSIS — IMO0002 Reserved for concepts with insufficient information to code with codable children: Secondary | ICD-10-CM

## 2021-04-30 DIAGNOSIS — I350 Nonrheumatic aortic (valve) stenosis: Secondary | ICD-10-CM | POA: Diagnosis not present

## 2021-04-30 DIAGNOSIS — E1165 Type 2 diabetes mellitus with hyperglycemia: Secondary | ICD-10-CM | POA: Diagnosis not present

## 2021-04-30 DIAGNOSIS — I5043 Acute on chronic combined systolic (congestive) and diastolic (congestive) heart failure: Secondary | ICD-10-CM | POA: Diagnosis not present

## 2021-04-30 DIAGNOSIS — E1169 Type 2 diabetes mellitus with other specified complication: Secondary | ICD-10-CM | POA: Diagnosis not present

## 2021-04-30 DIAGNOSIS — E1151 Type 2 diabetes mellitus with diabetic peripheral angiopathy without gangrene: Secondary | ICD-10-CM | POA: Diagnosis not present

## 2021-04-30 DIAGNOSIS — Z23 Encounter for immunization: Secondary | ICD-10-CM

## 2021-04-30 LAB — CBC WITH DIFFERENTIAL/PLATELET
Basophils Absolute: 0.1 10*3/uL (ref 0.0–0.1)
Basophils Relative: 1.1 % (ref 0.0–3.0)
Eosinophils Absolute: 0.1 10*3/uL (ref 0.0–0.7)
Eosinophils Relative: 1.6 % (ref 0.0–5.0)
HCT: 35.2 % — ABNORMAL LOW (ref 36.0–46.0)
Hemoglobin: 11.9 g/dL — ABNORMAL LOW (ref 12.0–15.0)
Lymphocytes Relative: 15.4 % (ref 12.0–46.0)
Lymphs Abs: 0.8 10*3/uL (ref 0.7–4.0)
MCHC: 33.7 g/dL (ref 30.0–36.0)
MCV: 100.1 fl — ABNORMAL HIGH (ref 78.0–100.0)
Monocytes Absolute: 0.2 10*3/uL (ref 0.1–1.0)
Monocytes Relative: 4.8 % (ref 3.0–12.0)
Neutro Abs: 3.9 10*3/uL (ref 1.4–7.7)
Neutrophils Relative %: 77.1 % — ABNORMAL HIGH (ref 43.0–77.0)
Platelets: 120 10*3/uL — ABNORMAL LOW (ref 150.0–400.0)
RBC: 3.52 Mil/uL — ABNORMAL LOW (ref 3.87–5.11)
RDW: 14.1 % (ref 11.5–15.5)
WBC: 5.1 10*3/uL (ref 4.0–10.5)

## 2021-04-30 LAB — HEMOGLOBIN A1C: Hgb A1c MFr Bld: 7.1 % — ABNORMAL HIGH (ref 4.6–6.5)

## 2021-04-30 MED ORDER — HYOSCYAMINE SULFATE 0.125 MG SL SUBL: SUBLINGUAL_TABLET | SUBLINGUAL | 2 refills | Status: DC

## 2021-04-30 MED ORDER — FUROSEMIDE 40 MG PO TABS: 20.0000 mg | ORAL_TABLET | Freq: Every day | ORAL | 3 refills | Status: DC

## 2021-04-30 NOTE — Assessment & Plan Note (Signed)
Check labs  Pt states bs are good

## 2021-04-30 NOTE — Assessment & Plan Note (Signed)
Per cardiology 

## 2021-04-30 NOTE — Patient Instructions (Signed)
Preventive Care 75 Years and Older, Female Preventive care refers to lifestyle choices and visits with your health care provider that can promote health and wellness. This includes:  A yearly physical exam. This is also called an annual wellness visit.  Regular dental and eye exams.  Immunizations.  Screening for certain conditions.  Healthy lifestyle choices, such as: ? Eating a healthy diet. ? Getting regular exercise. ? Not using drugs or products that contain nicotine and tobacco. ? Limiting alcohol use. What can I expect for my preventive care visit? Physical exam Your health care provider will check your:  Height and weight. These may be used to calculate your BMI (body mass index). BMI is a measurement that tells if you are at a healthy weight.  Heart rate and blood pressure.  Body temperature.  Skin for abnormal spots. Counseling Your health care provider may ask you questions about your:  Past medical problems.  Family's medical history.  Alcohol, tobacco, and drug use.  Emotional well-being.  Home life and relationship well-being.  Sexual activity.  Diet, exercise, and sleep habits.  History of falls.  Memory and ability to understand (cognition).  Work and work Statistician.  Pregnancy and menstrual history.  Access to firearms. What immunizations do I need? Vaccines are usually given at various ages, according to a schedule. Your health care provider will recommend vaccines for you based on your age, medical history, and lifestyle or other factors, such as travel or where you work.   What tests do I need? Blood tests  Lipid and cholesterol levels. These may be checked every 5 years, or more often depending on your overall health.  Hepatitis C test.  Hepatitis B test. Screening  Lung cancer screening. You may have this screening every year starting at age 75 if you have a 30-pack-year history of smoking and currently smoke or have quit within  the past 15 years.  Colorectal cancer screening. ? All adults should have this screening starting at age 44 and continuing until age 75. ? Your health care provider may recommend screening at age 75 if you are at increased risk. ? You will have tests every 1-10 years, depending on your results and the type of screening test.  Diabetes screening. ? This is done by checking your blood sugar (glucose) after you have not eaten for a while (fasting). ? You may have this done every 1-3 years.  Mammogram. ? This may be done every 1-2 years. ? Talk with your health care provider about how often you should have regular mammograms.  Abdominal aortic aneurysm (AAA) screening. You may need this if you are a current or former smoker.  BRCA-related cancer screening. This may be done if you have a family history of breast, ovarian, tubal, or peritoneal cancers. Other tests  STD (sexually transmitted disease) testing, if you are at risk.  Bone density scan. This is done to screen for osteoporosis. You may have this done starting at age 75. Talk with your health care provider about your test results, treatment options, and if necessary, the need for more tests. Follow these instructions at home: Eating and drinking  Eat a diet that includes fresh fruits and vegetables, whole grains, lean protein, and low-fat dairy products. Limit your intake of foods with high amounts of sugar, saturated fats, and salt.  Take vitamin and mineral supplements as recommended by your health care provider.  Do not drink alcohol if your health care provider tells you not to drink.  If you drink alcohol: ? Limit how much you have to 0-1 drink a day. ? Be aware of how much alcohol is in your drink. In the [place] one drink equals one 12 oz bottle of beer (355 mL), one 5 oz glass of wine (148 mL), or one 1 oz glass of hard liquor (44 mL).   Lifestyle  Take daily care of your teeth and gums. Brush your teeth every morning  and night with fluoride toothpaste. Floss one time each day.  Stay active. Exercise for at least 30 minutes 5 or more days each week.  Do not use any products that contain nicotine or tobacco, such as cigarettes, e-cigarettes, and chewing tobacco. If you need help quitting, ask your health care provider.  Do not use drugs.  If you are sexually active, practice safe sex. Use a condom or other form of protection in order to prevent STIs (sexually transmitted infections).  Talk with your health care provider about taking a low-dose aspirin or statin.  Find healthy ways to cope with stress, such as: ? Meditation, yoga, or listening to music. ? Journaling. ? Talking to a trusted person. ? Spending time with friends and family. Safety  Always wear your seat belt while driving or riding in a vehicle.  Do not drive: ? If you have been drinking alcohol. Do not ride with someone who has been drinking. ? When you are tired or distracted. ? While texting.  Wear a helmet and other protective equipment during sports activities.  If you have firearms in your house, make sure you follow all gun safety procedures. What's next?  Visit your health care provider once a year for an annual wellness visit.  Ask your health care provider how often you should have your eyes and teeth checked.  Stay up to date on all vaccines. This information is not intended to replace advice given to you by your health care provider. Make sure you discuss any questions you have with your health care provider. Document Revised: 11/05/2020 Document Reviewed: 11/09/2018 Elsevier Patient Education  2021 Elsevier Inc.  

## 2021-04-30 NOTE — Assessment & Plan Note (Signed)
Well controlled, no changes to meds. Encouraged heart healthy diet such as the DASH diet and exercise as tolerated.  °

## 2021-04-30 NOTE — Progress Notes (Signed)
Subjective:   By signing my name below, I, [name], attest that this documentation has been prepared under the direction and in the presence of Dr. Roma Schanz, DO. 04/30/2021      Patient ID: [name], female    DOB: [birth year], 75 y.o.   MRN: [id number]  Chief Complaint  Patient presents with  . Annual Exam    Pt states not fasting    HPI Patient is in today for a comprehensive physical exam. She complains of having issues with balance while walking. She does not want to try physical therapy at this time. She is requesting to start taking hyoscyamine again to manage her abdominal cramps. She stopped taking it due to the price and switched to imodium but only found mild relief. Her abdominal pain flares up when her depression and anxiety symptoms start affecting her. She is also requesting a refill for 40 mg lasix daily PO. She is managing her blood sugar well at this time and reports her blood sugar level was 5.5 after her surgery. She is UTD on vision care and sees Dr. Satira Sark. She is UTD on dental care and sees Dr. Satira Sark (different provider). She has had no recent changes to her family history. She had an aortic replacement surgery and a dental procedure, otherwise she has had no recent change to her surgical history. She has 3 moderna Covid-19 vaccines. She is due for a shingles and tetanus vaccine. She no longer gets mammogram screenings. She is not interested in getting a bone density scan. She last ate a cereal at 7:30 am this morning.    Past Medical History:  Diagnosis Date  . Acute thoracic aortic dissection [facility]) 2008   emergency surgery - [place]  . AICD (automatic cardioverter/defibrillator) present   . Anxiety   . Cancer ([place])    skin  . CKD (chronic kidney disease) stage 3, GFR 30-59 ml/min (HCC)   . Diabetes mellitus   . Diverticulosis   . GERD (gastroesophageal reflux disease)   . Hemorrhoids   . History of kidney stones   . Hyperlipidemia    . Hypertension   . Kidney stones   . S/P TAVR (transcatheter aortic valve replacement) 01/29/2020   s/p TAVR with a 23 mm Edwards Sapien 3 Ultra via the TA approach wtih Dr. Burt Knack and Dr. Cyndia Bent  . Severe aortic stenosis   . Sinus node dysfunction (HCC)    hx of PPM  . Sustained ventricular tachycardia ([place])    s/p ICD 05/2019    Past Surgical History:  Procedure Laterality Date  . ABDOMINAL HYSTERECTOMY  1984  . APPENDECTOMY    . EYE SURGERY  2014   Rght eye  . ICD IMPLANT N/A 06/28/2019   Procedure: PPM upgrade to ICD;  Surgeon: Evans Lance, MD;  Location: [place] [facility];  Service: Cardiovascular;  Laterality: N/A;  . PACEMAKER GENERATOR CHANGE  2014   Medtronic adapta  . PACEMAKER GENERATOR CHANGE N/A 08/29/2013   Procedure: PACEMAKER GENERATOR CHANGE;  Surgeon: Sanda Klein, MD;  Location: [facility];  Service: Cardiovascular;  Laterality: N/A;  . PACEMAKER INSERTION  08/2006   dual chamber Medtronic EnRhythm; r/t sinus node dysfunction   . REPAIR OF ACUTE ASCENDING THORACIC AORTIC DISSECTION  2008   Dr. Servando Snare  . RIGHT/LEFT HEART CATH AND CORONARY ANGIOGRAPHY N/A 01/09/2020   Procedure: RIGHT/LEFT HEART CATH AND CORONARY ANGIOGRAPHY;  Surgeon: Sherren Mocha, MD;  Location: [facility];  Service: Cardiovascular;  Laterality: N/A;  . TEE WITHOUT CARDIOVERSION N/A 01/29/2020   Procedure: TRANSESOPHAGEAL ECHOCARDIOGRAM (TEE);  Surgeon: Sherren Mocha, MD;  Location: [facility];  Service: Open Heart Surgery;  Laterality: N/A;  . THORACIC AORTIC ANEURYSM REPAIR  2000   type III  . TRANSCATHETER AORTIC VALVE REPLACEMENT, TRANSAPICAL N/A 01/29/2020   Procedure: TRANSCATHETER AORTIC VALVE REPLACEMENT, TRANSAPICAL using a Edwards Sapien 3, 74m  Transcatheter Heart Valve;  Surgeon: CSherren Mocha MD;  Location[place] [facility]  Service: Open Heart Surgery;  Laterality: N/A;  . TRANSTHORACIC ECHOCARDIOGRAM  09/20/2012   EF=>55% with mild conc LVH; LA mod dilated; RA mildly  dilated; mild MR/TR/AR    Family History  Problem Relation Age of Onset  . Kidney disease Mother   . Heart attack Father   . Heart attack Maternal Grandmother   . Valvular heart disease Son        valve replacement at 89 . Cancer Neg Hx     Social History   Socioeconomic History  . Marital status: Widowed    Spouse name: Not on file  . Number of children: 2  . Years of education: Not on file  . Highest education level: Not on file  Occupational History    Employer: RETIRED  Tobacco Use  . Smoking status: Former Smoker    Quit date: 09/04/1993    Years since quitting: 27.6  . Smokeless tobacco: Never Used  Vaping Use  . Vaping Use: Never used  Substance and Sexual Activity  . Alcohol use: No  . Drug use: No  . Sexual activity: Not Currently  Other Topics Concern  . Not on file  Social History Narrative   Lives at home and husband was just moved to [place]   Social Determinants of Health   Financial Resource Strain: Not on file  Food Insecurity: Not on file  Transportation Needs: Not on file  Physical Activity: Not on file  Stress: Not on file  Social Connections: Not on file  Intimate Partner Violence: Not on file    Outpatient Medications Prior to Visit  Medication Sig Dispense Refill  . ALPRAZolam (XANAX) 0.5 MG tablet TAKE 1 TABLET BY MOUTH THREE TIMES A DAY AS NEEDED FOR ANXIETY 90 tablet 0  . Ascorbic Acid (VITAMIN C) 1000 MG tablet Take 1,000 mg by mouth daily.    . carvedilol (COREG) 25 MG tablet TAKE 1 TABLET BY MOUTH TWICE A DAY WITH MEALS 180 tablet 1  . citalopram (CELEXA) 20 MG tablet Take 3 tablets (60 mg total) by mouth daily. 270 tablet 1  . [facility] ASPIRIN ADULT LOW DOSE 81 MG chewable tablet CHEW 1 TABLET BY MOUTH DAILY. 90 tablet 3  . empagliflozin (JARDIANCE) 10 MG TABS tablet TAKE 1 TABLET(10 MG) BY MOUTH DAILY. 90 tablet 1  . fluticasone (FLONASE) 50 MCG/ACT nasal spray USE 2 SPRAYS IN EACH NOSTIRL EVERY DAY 16 g 1  . furosemide (LASIX)  20 MG tablet TAKE 1 TABLET BY MOUTH EVERY DAY 90 tablet 3  . losartan (COZAAR) 25 MG tablet TAKE 1/2 TABLET BY MOUTH AT BEDTIME. 45 tablet 7  . Omega-3 Fatty Acids (FISH OIL) 1000 MG CAPS Take 1,000 mg by mouth daily.     . Potassium Chloride ER 20 MEQ TBCR TAKE 1 TABLET BY MOUTH EVERY DAY 90 tablet 4  . Probiotic Product (PRO-BIOTIC BLEND PO) Take 1 tablet by mouth daily.     . rosuvastatin (CRESTOR) 20 MG tablet Take 1 tablet (20 mg total) by  mouth daily. 90 tablet 1  . furosemide (LASIX) 40 MG tablet TAKE 1/2 TABLET BY MOUTH DAILY 45 tablet 3  . amoxicillin (AMOXIL) 500 MG tablet TAKE 4 TABLETS BY MOUTH 1 HOUR PRIOR TO DENTAL WORK, INCLUDING CLEANINGS (Patient not taking: Reported on 04/30/2021) 12 tablet 12   Facility-Administered Medications Prior to Visit  Medication Dose Route Frequency Provider Last Rate Last Admin  . diclofenac sodium (VOLTAREN) 1 % transdermal gel 2 g  2 g Topical QID Carollee Herter, Kendrick Fries R, DO        Allergies  Allergen Reactions  . Oxycodone Itching  . Vicodin [Hydrocodone-Acetaminophen] Itching  . Latex Itching and Rash  . Neomycin-Bacitracin Zn-Polymyx Other (See Comments)    Red , swollen eye  . Sulfonamide Derivatives Other (See Comments)    Just had reaction when taking during pregnancy- last child birth 85    Review of Systems  Constitutional: Negative for fever and malaise/fatigue.  HENT: Negative for congestion.   Eyes: Negative for blurred vision.  Respiratory: Negative for cough and shortness of breath.   Cardiovascular: Negative for chest pain, palpitations and leg swelling.  Gastrointestinal: Negative for vomiting.  Musculoskeletal: Negative for back pain.       (+)balance issues  Skin: Negative for rash.  Neurological: Negative for loss of consciousness and headaches.       Objective:    Physical Exam Vitals and nursing note reviewed.  Constitutional:      General: She is not in acute distress.    Appearance: Normal appearance. She  is not ill-appearing.  HENT:     Head: Normocephalic and atraumatic.     Right Ear: External ear normal.     Left Ear: External ear normal.  Eyes:     Extraocular Movements: Extraocular movements intact.     Pupils: Pupils are equal, round, and reactive to light.  Cardiovascular:     Rate and Rhythm: Normal rate and regular rhythm.     Pulses: Normal pulses.     Heart sounds: Normal heart sounds. No murmur heard. No gallop.   Pulmonary:     Effort: Pulmonary effort is normal. No respiratory distress.     Breath sounds: Normal breath sounds. No wheezing, rhonchi or rales.  Skin:    General: Skin is warm and dry.  Neurological:     Mental Status: She is alert and oriented to person, place, and time.  Psychiatric:        Behavior: Behavior normal.     BP 126/84 (BP Location: Right Arm, Patient Position: Sitting, Cuff Size: Normal)   Pulse 80   Temp 98.7 F (37.1 C) (Oral)   Resp 18   Ht _0  (1.549 m)   Wt 189 lb 12.8 oz (86.1 kg)   SpO2 96%   BMI 35.86 kg/m  Wt Readings from Last 3 Encounters:  04/30/21 189 lb 12.8 oz (86.1 kg)  03/02/21 185 lb (83.9 kg)  01/28/21 184 lb 12.8 oz (83.8 kg)    Diabetic Foot Exam - Simple   No data filed    Lab Results  Component Value Date   WBC 4.0 02/01/2020   HGB 9.6 (L) 02/01/2020   HCT 29.5 (L) 02/01/2020   PLT 92 (L) 02/01/2020   GLUCOSE 136 (H) 03/02/2021   CHOL 134 09/02/2020   TRIG 181 (H) 09/02/2020   HDL 43 (L) 09/02/2020   LDLDIRECT 217.7 09/13/2011   LDLCALC 65 09/02/2020   ALT 15 09/02/2020   AST 17 09/02/2020  NA 141 03/02/2021   K 5.1 03/02/2021   CL 106 03/02/2021   CREATININE 1.16 (H) 03/02/2021   BUN 29 (H) 03/02/2021   CO2 20 03/02/2021   TSH 0.893 06/28/2019   INR 1.1 01/25/2020   HGBA1C 5.9 (H) 09/02/2020   MICROALBUR 0.9 09/02/2020    Lab Results  Component Value Date   TSH 0.893 06/28/2019   Lab Results  Component Value Date   WBC 4.0 02/01/2020   HGB 9.6 (L) 02/01/2020   HCT 29.5  (L) 02/01/2020   MCV 92.8 02/01/2020   PLT 92 (L) 02/01/2020   Lab Results  Component Value Date   NA 141 03/02/2021   K 5.1 03/02/2021   CO2 20 03/02/2021   GLUCOSE 136 (H) 03/02/2021   BUN 29 (H) 03/02/2021   CREATININE 1.16 (H) 03/02/2021   BILITOT 0.3 09/02/2020   ALKPHOS 85 01/25/2020   AST 17 09/02/2020   ALT 15 09/02/2020   PROT 7.4 09/02/2020   ALBUMIN 3.2 (L) 01/25/2020   CALCIUM 9.6 03/02/2021   ANIONGAP 8 02/01/2020   EGFR 49 (L) 03/02/2021   GFR 53.77 (L) 06/28/2017   Lab Results  Component Value Date   CHOL 134 09/02/2020   Lab Results  Component Value Date   HDL 43 (L) 09/02/2020   Lab Results  Component Value Date   LDLCALC 65 09/02/2020   Lab Results  Component Value Date   TRIG 181 (H) 09/02/2020   Lab Results  Component Value Date   CHOLHDL 3.1 09/02/2020   Lab Results  Component Value Date   HGBA1C 5.9 (H) 09/02/2020   Mammogram- Last completed 09/14/2012. Results normal. Repeat in 1 year (Due). Pap Smear- Due. Dexa- Last completed 09/14/2012. Results showed osteoporosis. Repeat in 2 years (Due). Colonoscopy- Due.     Assessment & Plan:   Problem List Items Addressed This Visit      Unprioritized   Acute on chronic combined systolic and diastolic CHF (congestive heart failure) ([place])    Per cardiology      Relevant Medications   furosemide (LASIX) 40 MG tablet   DM (diabetes mellitus) type II uncontrolled, periph vascular disorder ([place])    Check labs  Pt states bs are good      Relevant Medications   furosemide (LASIX) 40 MG tablet   Hyperlipidemia associated with type 2 diabetes mellitus ([place])    Tolerating statin, encouraged heart healthy diet, avoid trans fats, minimize simple carbs and saturated fats. Increase exercise as tolerated      Preventative health care - Primary    Ghm utd Check labs  Health Maintenance  Topic Date Due  . Zoster Vaccines- Shingrix (1 of 2) Never done  . TETANUS/TDAP  09/01/2016  .  OPHTHALMOLOGY EXAM  01/21/2021  . HEMOGLOBIN A1C  03/03/2021  . MAMMOGRAM  04/30/2022 (Originally 09/14/2013)  . COLONOSCOPY (Pts 45-41yr Insurance coverage will need to be confirmed)  05/11/2029 (Originally 06/22/2014)  . INFLUENZA VACCINE  06/29/2021  . FOOT EXAM  04/30/2022  . DEXA SCAN  Completed  . COVID-19 Vaccine  Completed  . Hepatitis C Screening  Completed  . PNA vac Low Risk Adult  Completed  . HPV VACCINES  Aged Out        Relevant Orders   Lipid panel   Hemoglobin A1c   Comprehensive metabolic panel   Microalbumin / creatinine urine ratio   CBC with Differential/Platelet   Primary hypertension    Well controlled, no changes to meds. Encouraged  heart healthy diet such as the DASH diet and exercise as tolerated.       Relevant Medications   furosemide (LASIX) 40 MG tablet   Severe aortic stenosis    S/p valve replacement  Per cardiology      Relevant Medications   furosemide (LASIX) 40 MG tablet    Other Visit Diagnoses    Essential hypertension       Relevant Medications   furosemide (LASIX) 40 MG tablet   Other Relevant Orders   Lipid panel   Hemoglobin A1c   Comprehensive metabolic panel   Microalbumin / creatinine urine ratio   CBC with Differential/Platelet   Irritable bowel syndrome with diarrhea       Relevant Medications   hyoscyamine (LEVSIN SL) 0.125 MG SL tablet   Type 2 diabetes mellitus with hyperglycemia, without long-term current use of insulin (HCC)       Relevant Orders   Lipid panel   Hemoglobin A1c   Comprehensive metabolic panel   Microalbumin / creatinine urine ratio   CBC with Differential/Platelet   Hyperlipidemia, unspecified hyperlipidemia type       Relevant Medications   furosemide (LASIX) 40 MG tablet   Other Relevant Orders   Lipid panel   Comprehensive metabolic panel   Microalbumin / creatinine urine ratio       Meds ordered this encounter  Medications  . furosemide (LASIX) 40 MG tablet    Sig: Take 0.5  tablets (20 mg total) by mouth daily.    Dispense:  45 tablet    Refill:  3  . hyoscyamine (LEVSIN SL) 0.125 MG SL tablet    Sig: TAKE 1 TABLET BY MOUTH TWICE A DAY AS NEEDED FOR CRAMPING OR DIARRHEA OR LOOSE STOOLS    Dispense:  60 tablet    Refill:  2    WE WERE REFERRED TO CALL DR. Etter Sjogren BY DR. Collier Bullock OFFICE    I, Dr. Roma Schanz, DO, personally preformed the services described in this documentation.  All medical record entries made by the scribe were at my direction and in my presence.  I have reviewed the chart and discharge instructions (if applicable) and agree that the record reflects my personal performance and is accurate and complete. 04/30/2021   I,[name],acting as a scribe for [name], DO.,have documented all relevant documentation on the behalf of [name], DO,as directed by  [name], DO while in the presence of [name], DO.   [name], DO

## 2021-04-30 NOTE — Assessment & Plan Note (Signed)
Tolerating statin, encouraged heart healthy diet, avoid trans fats, minimize simple carbs and saturated fats. Increase exercise as tolerated 

## 2021-04-30 NOTE — Assessment & Plan Note (Signed)
Ghm utd Check labs  Health Maintenance  Topic Date Due  . Zoster Vaccines- Shingrix (1 of 2) Never done  . TETANUS/TDAP  09/01/2016  . OPHTHALMOLOGY EXAM  01/21/2021  . HEMOGLOBIN A1C  03/03/2021  . MAMMOGRAM  04/30/2022 (Originally 09/14/2013)  . COLONOSCOPY (Pts 45-59yrs Insurance coverage will need to be confirmed)  05/11/2029 (Originally 06/22/2014)  . INFLUENZA VACCINE  06/29/2021  . FOOT EXAM  04/30/2022  . DEXA SCAN  Completed  . COVID-19 Vaccine  Completed  . Hepatitis C Screening  Completed  . PNA vac Low Risk Adult  Completed  . HPV VACCINES  Aged Out

## 2021-04-30 NOTE — Progress Notes (Signed)
   Covid-19 Vaccination Clinic  Name:  [name]    MRN: [id number] DOB: 06/26/1946  04/30/2021  [name] was observed post Covid-19 immunization for 15 minutes without incident. She was provided with Vaccine Information Sheet and instruction to access the V-Safe system.   [name] was instructed to call 911 with any severe reactions post vaccine: [name] Difficulty breathing  . Swelling of face and throat  . A fast heartbeat  . A bad rash all over body  . Dizziness and weakness   Immunizations Administered    Name Date Dose VIS Date Route   Moderna Covid-19 Booster Vaccine 04/30/2021 10:53 AM 0.25 mL 09/17/2020 Intramuscular   Manufacturer: Moderna   Lot: [id number]   [place]: 06015-615-37

## 2021-04-30 NOTE — Assessment & Plan Note (Signed)
S/p valve replacement  Per cardiology

## 2021-05-01 LAB — LIPID PANEL
Cholesterol: 170 mg/dL (ref 0–200)
HDL: 53.9 mg/dL (ref >39.00)
LDL Cholesterol: 83 mg/dL (ref 0–99)
NonHDL: 116.39
Total CHOL/HDL Ratio: 3
Triglycerides: 165 mg/dL — ABNORMAL HIGH (ref 0.0–149.0)
VLDL: 33 mg/dL (ref 0.0–40.0)

## 2021-05-01 LAB — MICROALBUMIN / CREATININE URINE RATIO
Creatinine,U: 63.2 mg/dL
Microalb Creat Ratio: 2 mg/g (ref 0.0–30.0)
Microalb, Ur: 1.3 mg/dL (ref 0.0–1.9)

## 2021-05-01 LAB — COMPREHENSIVE METABOLIC PANEL
ALT: 11 U/L (ref 0–35)
AST: 14 U/L (ref 0–37)
Albumin: 4.2 g/dL (ref 3.5–5.2)
Alkaline Phosphatase: 68 U/L (ref 39–117)
BUN: 37 mg/dL — ABNORMAL HIGH (ref 6–23)
CO2: 23 mEq/L (ref 19–32)
Calcium: 9.4 mg/dL (ref 8.4–10.5)
Chloride: 104 mEq/L (ref 96–112)
Creatinine, Ser: 1.38 mg/dL — ABNORMAL HIGH (ref 0.40–1.20)
GFR: 37.69 mL/min — ABNORMAL LOW (ref >60.00)
Glucose, Bld: 278 mg/dL — ABNORMAL HIGH (ref 70–99)
Potassium: 4.6 mEq/L (ref 3.5–5.1)
Sodium: 141 mEq/L (ref 135–145)
Total Bilirubin: 0.4 mg/dL (ref 0.2–1.2)
Total Protein: 6.9 g/dL (ref 6.0–8.3)

## 2021-05-04 ENCOUNTER — Other Ambulatory Visit (HOSPITAL_BASED_OUTPATIENT_CLINIC_OR_DEPARTMENT_OTHER): Payer: Self-pay

## 2021-05-04 MED ORDER — MODERNA COVID-19 VACCINE 100 MCG/0.5ML IM SUSP
INTRAMUSCULAR | 0 refills | Status: DC
  Filled 2021-05-04: qty 0.25, 1d supply, fill #0

## 2021-05-05 ENCOUNTER — Other Ambulatory Visit (HOSPITAL_BASED_OUTPATIENT_CLINIC_OR_DEPARTMENT_OTHER): Payer: Self-pay

## 2021-05-05 ENCOUNTER — Telehealth: Payer: Self-pay

## 2021-05-05 ENCOUNTER — Other Ambulatory Visit: Payer: Self-pay

## 2021-05-05 ENCOUNTER — Other Ambulatory Visit: Payer: Self-pay | Admitting: Family Medicine

## 2021-05-05 DIAGNOSIS — E1165 Type 2 diabetes mellitus with hyperglycemia: Secondary | ICD-10-CM

## 2021-05-05 DIAGNOSIS — E1169 Type 2 diabetes mellitus with other specified complication: Secondary | ICD-10-CM

## 2021-05-05 MED ORDER — OZEMPIC (0.25 OR 0.5 MG/DOSE) 2 MG/1.5ML ~~LOC~~ SOPN: PEN_INJECTOR | SUBCUTANEOUS | 0 refills | Status: DC

## 2021-05-05 NOTE — Telephone Encounter (Signed)
She is already on that with the janumet

## 2021-05-05 NOTE — Telephone Encounter (Signed)
It is ok to take both------ but we had janumet on her list Celesta Gentile and metformin together)  --- I just want to make sure what she has is [place] by itself

## 2021-05-05 NOTE — Telephone Encounter (Signed)
Ok--  [place] or [place] ?   She can take that with jardiance----  will  need update med list please.  Thank you

## 2021-05-05 NOTE — Telephone Encounter (Signed)
Pt says she only takes Jardiance currently.

## 2021-05-05 NOTE — Telephone Encounter (Signed)
Ozempic is on back order. So she is asking if she can restart the Januvia instead since its a Haematologist.   -she is on Jardiance right now. But she understands she might have to d/c this if you restart Januvia.

## 2021-05-05 NOTE — Telephone Encounter (Signed)
Pt called back and said [facility] said Oz[facility] was on [facility] back order. She says she has about 3 months worth of Januvia at home from prior- can she be switched to this. She understands its a different drug class, but she is just trying to figure whet to do at this point.

## 2021-05-06 ENCOUNTER — Telehealth: Payer: Self-pay | Admitting: Family Medicine

## 2021-05-06 NOTE — Telephone Encounter (Signed)
Called pt   1. Make sure she has not been able to get the Ozempic yet.  2. Let hre know this message from Dr Etter Sjogren earlier in the thread: " Ok--  janument or [place] ?   She can take that with jardiance----  will  need update med list please.  Thank you"   Had to leave a VM.

## 2021-05-06 NOTE — Telephone Encounter (Signed)
Disregard:   Pt says she is embarrassed, she says she told us what the pharmacy told her. But the pharmacy called her after 7 pm come pick her medication up. So she now has the Ozempic in her possession. No medication changes are needed at this time. Pt sends her apologies for the mix up.

## 2021-05-06 NOTE — Telephone Encounter (Signed)
I'm really confused by this. Since you spoke to the patient prior could you clarify with the patient please

## 2021-05-06 NOTE — Telephone Encounter (Signed)
Pt, following up on phone call  Please advice

## 2021-05-14 ENCOUNTER — Telehealth: Payer: Self-pay

## 2021-05-14 ENCOUNTER — Telehealth: Payer: Medicare HMO

## 2021-05-14 NOTE — Telephone Encounter (Signed)
  Care Management   Follow Up Note   05/14/2021 Name: [name] MRN: [id number] DOB: 10/01/1946   Referred by: [name], DO Reason for referral : Chronic Care Management (RNCM Initial Call)   An unsuccessful telephone outreach was attempted today. The patient was referred to the case management team for assistance with care management and care coordination.   RNCM spoke with patient-two patient identifiers confirmed. RNCM discussed the purpose of the call. [name] states "I am doing great". She states it is not a good time to talk as she is on her way out of town and request to reschedule telephone assessment.  Follow Up Plan: Telephone follow up appointment with care management team member scheduled for: 05/25/21.  Thea Silversmith, RN, MSN, BSN, CCM Care Management Coordinator [facility] [phone]

## 2021-05-21 ENCOUNTER — Other Ambulatory Visit: Payer: Self-pay | Admitting: Family Medicine

## 2021-05-21 DIAGNOSIS — F419 Anxiety disorder, unspecified: Secondary | ICD-10-CM

## 2021-05-21 NOTE — Telephone Encounter (Signed)
Requesting:Alprazolam Contract:needs updated csc UDS:09/02/2020 Last Visit:04/30/2021 Next Visit:05/25/2021 Last Refill:04/23/2021  Patient going on vacation needing refill early to fill prior to leaving.   Please Advise

## 2021-05-22 ENCOUNTER — Ambulatory Visit (INDEPENDENT_AMBULATORY_CARE_PROVIDER_SITE_OTHER): Payer: Medicare HMO

## 2021-05-22 ENCOUNTER — Telehealth: Payer: Self-pay | Admitting: Family Medicine

## 2021-05-22 DIAGNOSIS — E1169 Type 2 diabetes mellitus with other specified complication: Secondary | ICD-10-CM

## 2021-05-22 DIAGNOSIS — E1165 Type 2 diabetes mellitus with hyperglycemia: Secondary | ICD-10-CM

## 2021-05-22 DIAGNOSIS — E1151 Type 2 diabetes mellitus with diabetic peripheral angiopathy without gangrene: Secondary | ICD-10-CM | POA: Diagnosis not present

## 2021-05-22 DIAGNOSIS — I10 Essential (primary) hypertension: Secondary | ICD-10-CM | POA: Diagnosis not present

## 2021-05-22 DIAGNOSIS — E785 Hyperlipidemia, unspecified: Secondary | ICD-10-CM | POA: Diagnosis not present

## 2021-05-22 DIAGNOSIS — IMO0002 Reserved for concepts with insufficient information to code with codable children: Secondary | ICD-10-CM

## 2021-05-22 NOTE — Patient Instructions (Signed)
Visit Information   PATIENT GOALS:   Goals Addressed             This Visit's Progress    Monitor and Manage My Blood Sugar-Diabetes Type 2       Timeframe:  Long-Range Goal Priority:  High Start Date:  05/22/21                           Expected End Date:   11/21/21                    Follow Up Date 05/25/21    Patient Goals: - obtain over the counter CBG machine and begin checking blood sugar levels per provider recommendations and as needed.  - Check blood sugars as recommended by your provider and utilize hypoglycemia protocol as needed Follow the Rule blood sugars less than 37m/dl: STEP  1:  Take 15 grams of carbohydrates when your blood sugar is low, which includes:              3-4 glucose tabs or             3-4 oz of juice or regular soda or             One tube of glucose gel STEP 2:  Recheck blood sugar in 15 minutes STEP 3:  If your blood sugar is still low at the 15 minute recheck ---then, go back to STEP 1 and treat again with another 15 grams of carbohydrates - enter blood sugar readings and medication or insulin into daily log - eat some food containing protein and fat and vegetables today.   Why is this important?   Checking your blood sugar at home helps to keep it from getting very high or very low.  Writing the results in a diary or log helps the doctor know how to care for you.  Your blood sugar log should have the time, date and the results.  Also, write down the amount of insulin or other medicine that you take.  Other information, like what you ate, exercise done and how you were feeling, will also be helpful.     Notes:          Consent to CCM Services: Ms. TAudiawas given information about Chronic Care Management services today including:  CCM service includes personalized support from designated clinical staff supervised by her physician, including individualized plan of care and coordination with other care providers 24/7 contact phone  numbers for assistance for urgent and routine care needs. Service will only be billed when office clinical staff spend 20 minutes or more in a month to coordinate care. Only one practitioner may furnish and bill the service in a calendar month. The patient may stop CCM services at any time (effective at the end of the month) by phone call to the office staff. The patient will be responsible for cost sharing (co-pay) of up to 20% of the service fee (after annual deductible is met).  Patient agreed to services and verbal consent obtained.   Patient verbalizes understanding of instructions provided today and agrees to [place] in [place]   Telephone follow up appointment with care management team member scheduled for: The patient has been provided with contact information for the care management team and has been advised to call with any health related questions or concerns.   JThea Silversmith RN, MSN, BSN, CCM Care Management Coordinator [facility] [phone]   CLINICAL CARE PLAN: Patient Care Plan: Diabetes Type 2 (Adult)     Problem Identified: Long term care plan for management of Diabetes in a patient with cardiovascular disease   Priority: High     Long-Range Goal: Disease progression of Diabetes maintained or minimized.   Start Date: 05/22/2021  Expected End Date: 11/21/2021  Priority: High  Note:   Objective:  Lab Results  Component Value Date   HGBA1C 7.1 (H) 04/30/2021   Lab Results  Component Value Date   CREATININE 1.38 (H) 04/30/2021   CREATININE 1.16 (H) 03/02/2021   CREATININE 1.14 (H) 09/02/2020   Lab Results  Component Value Date   EGFR 49 (L) 03/02/2021  Current Barriers:  Knowledge Deficits related to long term management of Diabetes in a patient with history of cardiovascular disease. RNCM received call from client(initial engagement). Client states that she was just started on ozempic. She states her first dose was late Monday afternoon. She  reports feeling: unusual; tired; no energy and not feeling hungry. She would like to know if this is normal. RNCM reinforced provider needs to be aware these new signs/symptoms. With further discussion, Client acknowledges that she does not have a CBG machine and has not been checking her blood sugar. She reports that she has been feeling better as each day passed. She reports she even had a headache that is relieved now. Ms. Rammel adds it was not her normal anxiety symptoms. She states she had toast and jelly earlier and states she is feeling "much, much better". Client agreed to eat some protein such as chicken or peanut butter sandwich with glass of 2% milk. Client reports she will have her daughter get a CBG machine today and begin checking her blood sugar. Does not have glucometer to monitor blood sugar Case Manager Clinical Goal(s):  patient will demonstrate improved adherence to prescribed treatment plan for diabetes self care/management as evidenced by: daily monitoring and recording of CBG  adherence to ADA/ carb modified diet adherence to prescribed medication regimen contacting provider for new or worsened symptoms or questions Interventions:  Collaboration with Carollee Herter, Alferd Apa, DO regarding development and update of comprehensive plan of care as evidenced by provider attestation and co-signature Inter-disciplinary care team collaboration (see longitudinal plan of care) Reviewed medications with patient and discussed importance of medication adherence Reviewed signs/symptoms of hypoglycemia and rule of 15 for treatment of low blood sugar. Contacted primary care provider office and notified of above RNCM encouraged client to eat something with protein/fat today. RNCM encouraged client to discuss her signs/symptoms with primary care provider. Provided education on low blood sugar and treatment of low blood sugar Discussed plans with patient for ongoing care management follow up and  provided patient with direct contact information for care management team Patient Goals: - obtain over the counter CBG machine and begin checking blood sugar levels per provider recommendations and as needed.  - Check blood sugars as recommended by your provider and utilize hypoglycemia protocol as needed Follow the Rule blood sugars less than 21m/dl: STEP  1:  Take 15 grams of carbohydrates when your blood sugar is low, which includes:              3-4 glucose tabs or             3-4 oz of juice or regular soda or             One tube of glucose gel STEP 2:  Recheck blood sugar in 15 minutes STEP 3:  If your blood sugar is still low at the 15 minute recheck ---then, go back to STEP 1 and treat again with another 15 grams of carbohydrates - enter blood sugar readings and medication or insulin into daily log - eat some food containing protein and fat and vegetables today. Follow Up Plan: Telephone follow up appointment with care management team member scheduled for: 05/25/21 The patient has been provided with contact information for the care management team and has been advised to call with any health related questions or concerns.

## 2021-05-22 NOTE — Chronic Care Management (AMB) (Signed)
Chronic Care Management   CCM RN Visit Note  05/22/2021 Name: [name] MRN: [id number] DOB: Oct 06, 1946  Subjective: [name] is a 75 y.o. year old female who is a primary care patient of [name], DO. The care management team was consulted for assistance with disease management and care coordination needs.    Engaged with patient by telephone for  acute call . Client client and expressed starting new medication, ozempic and feeling unusual, tired, no energy and not hungry.  Consent to Services:  The patient was given the following information about Chronic Care Management services today, agreed to services, and gave verbal consent: 1. CCM service includes personalized support from designated clinical staff supervised by the primary care provider, including individualized plan of care and coordination with other care providers 2. 24/7 contact phone numbers for assistance for urgent and routine care needs. 3. Service will only be billed when office clinical staff spend 20 minutes or more in a month to coordinate care. 4. Only one practitioner may furnish and bill the service in a calendar month. 5.The patient may stop CCM services at any time (effective at the end of the month) by phone call to the office staff. 6. The patient will be responsible for cost sharing (co-pay) of up to 20% of the service fee (after annual deductible is met). Patient agreed to services and consent obtained.  Patient agreed to services and verbal consent obtained.   Assessment: Review of patient past medical history, allergies, medications, health status, including review of consultants reports, laboratory and other test data, was performed as part of comprehensive evaluation and provision of chronic care management services.   SDOH (Social Determinants of Health) assessments and interventions performed:    CCM Care Plan  Allergies  Allergen Reactions   Oxycodone Itching   Vicodin  [Hydrocodone-Acetaminophen] Itching   Latex Itching and Rash   Neomycin-Bacitracin Zn-Polymyx Other (See Comments)    Red , swollen eye   Sulfonamide Derivatives Other (See Comments)    Just had reaction when taking during pregnancy- last child birth 51    Outpatient Encounter Medications as of 05/22/2021  Medication Sig   ALPRAZolam (XANAX) 0.5 MG tablet TAKE 1 TABLET BY MOUTH THREE TIMES A DAY AS NEEDED FOR ANXIETY   Ascorbic Acid (VITAMIN C) 1000 MG tablet Take 1,000 mg by mouth daily.   carvedilol (COREG) 25 MG tablet TAKE 1 TABLET BY MOUTH TWICE A DAY WITH MEALS   citalopram (CELEXA) 20 MG tablet Take 3 tablets (60 mg total) by mouth daily.   [employer] ASPIRIN ADULT LOW DOSE 81 MG chewable tablet CHEW 1 TABLET BY MOUTH DAILY.   empagliflozin (JARDIANCE) 10 MG TABS tablet TAKE 1 TABLET(10 MG) BY MOUTH DAILY.   fluticasone (FLONASE) 50 MCG/ACT nasal spray USE 2 SPRAYS IN EACH NOSTIRL EVERY DAY   furosemide (LASIX) 20 MG tablet TAKE 1 TABLET BY MOUTH EVERY DAY   hyoscyamine (LEVSIN SL) 0.125 MG SL tablet TAKE 1 TABLET BY MOUTH TWICE A DAY AS NEEDED FOR CRAMPING OR DIARRHEA OR LOOSE STOOLS   losartan (COZAAR) 25 MG tablet TAKE 1/2 TABLET BY MOUTH AT BEDTIME.   Omega-3 Fatty Acids (FISH OIL) 1000 MG CAPS Take 1,000 mg by mouth daily.    Potassium Chloride ER 20 MEQ TBCR TAKE 1 TABLET BY MOUTH EVERY DAY   Probiotic Product (PRO-BIOTIC BLEND PO) Take 1 tablet by mouth daily.    rosuvastatin (CRESTOR) 20 MG tablet Take 1 tablet (20 mg  total) by mouth daily.   Semaglutide,0.25 or 0.5MG/DOS, (OZEMPIC, 0.25 OR 0.5 MG/DOSE,) 2 MG/1.5ML SOPN Inject 0.25 mg weekly for 4 weeks and then increase to 0.5 mg thereafter.   amoxicillin (AMOXIL) 500 MG tablet TAKE 4 TABLETS BY MOUTH 1 HOUR PRIOR TO DENTAL WORK, INCLUDING CLEANINGS (Patient not taking: Reported on 04/30/2021)   COVID-19 mRNA vaccine, Moderna, (MODERNA COVID-19 VACCINE) 100 MCG/0.5ML injection Inject into the muscle.   furosemide (LASIX) 40 MG  tablet Take 0.5 tablets (20 mg total) by mouth daily.   Facility-Administered Encounter Medications as of 05/22/2021  Medication   diclofenac sodium (VOLTAREN) 1 % transdermal gel 2 g    Patient Active Problem List   Diagnosis Date Noted   Preventative health care 04/30/2021   Hyperlipidemia associated with type 2 diabetes mellitus ([place]) 09/02/2020   Uncontrolled type 2 diabetes mellitus with hyperglycemia ([place]) 09/02/2020   Acute on chronic combined systolic and diastolic CHF (congestive heart failure) ([place]) 01/29/2020   S/P TAVR (transcatheter aortic valve replacement) 01/29/2020   Type 2 diabetes mellitus with complication (HCC)    Severe aortic stenosis    CKD (chronic kidney disease) stage 3, GFR 30-59 ml/min (HCC)    ICD (implantable cardioverter-defibrillator) in place 08/15/2019   Ventricular tachycardia ([place])    Anemia 06/27/2019   Severe obesity (BMI 35.0-35.9 with comorbidity) ([place]) 12/08/2018   DM (diabetes mellitus) type II uncontrolled, periph vascular disorder ([place]) 04/24/2015   Paroxysmal atrial flutter ([place]) 10/07/2013   GERD 01/15/2011   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   Start Date: 05/22/2021  Expected End Date: 11/21/2021  Priority: High  Note:   Objective:  Lab Results  Component Value Date   HGBA1C 7.1 (H) 04/30/2021   Lab Results  Component Value Date   CREATININE 1.38 (H) 04/30/2021    CREATININE 1.16 (H) 03/02/2021   CREATININE 1.14 (H) 09/02/2020   Lab Results  Component Value Date   EGFR 49 (L) 03/02/2021  Current Barriers:  Knowledge Deficits related to long term management of Diabetes in a patient with history of cardiovascular disease. RNCM received call from client(initial engagement). Client states that she was just started on ozempic. She states her first dose was late Monday afternoon. She reports feeling: unusual; tired; no energy and not feeling hungry. She would like to know if this is normal. RNCM reinforced provider needs to be aware these new signs/symptoms. With further discussion, Client acknowledges that she does not have a CBG machine and has not been checking her blood sugar. She reports that she has been feeling better as each day passed. She reports she even had a headache that is relieved now. Ms. Pennix adds it was not her normal anxiety symptoms. She states she had toast and jelly earlier and states she is feeling "much, much better". Client agreed to eat some protein such as chicken or peanut butter sandwich with glass of 2% milk. Client reports  she will have her daughter get a CBG machine today and begin checking her blood sugar. Does not have glucometer to monitor blood sugar Case Manager Clinical Goal(s):  patient will demonstrate improved adherence to prescribed treatment plan for diabetes self care/management as evidenced by: daily monitoring and recording of CBG  adherence to ADA/ carb modified diet adherence to prescribed medication regimen contacting provider for new or worsened symptoms or questions Interventions:  Collaboration with Carollee Herter, Alferd Apa, DO regarding development and update of comprehensive plan of care as evidenced by provider attestation and co-signature Inter-disciplinary care team collaboration (see longitudinal plan of care) Reviewed medications with patient and discussed importance of medication adherence Reviewed  signs/symptoms of hypoglycemia and rule of 15 for treatment of low blood sugar. Contacted primary care provider office and notified of above RNCM encouraged client to eat something with protein/fat today. RNCM encouraged client to discuss her signs/symptoms with primary care provider. Provided education on low blood sugar and treatment of low blood sugar Discussed plans with patient for ongoing care management follow up and provided patient with direct contact information for care management team Patient Goals: - obtain over the counter CBG machine and begin checking blood sugar levels per provider recommendations and as needed.  - Check blood sugars as recommended by your provider and utilize hypoglycemia protocol as needed Follow the Rule blood sugars less than 49m/dl: STEP  1:  Take 15 grams of carbohydrates when your blood sugar is low, which includes:              3-4 glucose tabs or             3-4 oz of juice or regular soda or             One tube of glucose gel STEP 2:  Recheck blood sugar in 15 minutes STEP 3:  If your blood sugar is still low at the 15 minute recheck ---then, go back to STEP 1 and treat again with another 15 grams of carbohydrates - enter blood sugar readings and medication or insulin into daily log - eat some food containing protein and fat and vegetables today. Follow Up Plan: Telephone follow up appointment with care management team member scheduled for: 05/25/21 The patient has been provided with contact information for the care management team and has been advised to call with any health related questions or concerns.       Plan:Telephone follow up appointment with care management team member scheduled for:  05/25/21 and The patient has been provided with contact information for the care management team and has been advised to call with any health related questions or concerns.   JThea Silversmith RN, MSN, BSN, CCM Care Management Coordinator [phone]

## 2021-05-22 NOTE — Telephone Encounter (Signed)
Caller: Denton Brick wallace Case manager with [facility] Call back [phone]  Franky Macho states in her conversation with the patient today that the patient stated she was feeling tired, had no energy, and not hungry. The patient thinks it might be an oxempic side effect. The patient does not have a meter to check her blood sugar. The patient states she is going to ask her daughter to purchase one this afternoon.   Please advise

## 2021-05-25 ENCOUNTER — Ambulatory Visit: Payer: Medicare HMO

## 2021-05-25 ENCOUNTER — Ambulatory Visit (INDEPENDENT_AMBULATORY_CARE_PROVIDER_SITE_OTHER): Payer: Medicare HMO

## 2021-05-25 DIAGNOSIS — E1169 Type 2 diabetes mellitus with other specified complication: Secondary | ICD-10-CM | POA: Diagnosis not present

## 2021-05-25 DIAGNOSIS — I495 Sick sinus syndrome: Secondary | ICD-10-CM | POA: Diagnosis not present

## 2021-05-25 DIAGNOSIS — IMO0002 Reserved for concepts with insufficient information to code with codable children: Secondary | ICD-10-CM

## 2021-05-25 DIAGNOSIS — E1151 Type 2 diabetes mellitus with diabetic peripheral angiopathy without gangrene: Secondary | ICD-10-CM | POA: Diagnosis not present

## 2021-05-25 DIAGNOSIS — E1165 Type 2 diabetes mellitus with hyperglycemia: Secondary | ICD-10-CM | POA: Diagnosis not present

## 2021-05-25 DIAGNOSIS — E785 Hyperlipidemia, unspecified: Secondary | ICD-10-CM | POA: Diagnosis not present

## 2021-05-25 DIAGNOSIS — I10 Essential (primary) hypertension: Secondary | ICD-10-CM

## 2021-05-25 DIAGNOSIS — I4892 Unspecified atrial flutter: Secondary | ICD-10-CM

## 2021-05-25 NOTE — Patient Instructions (Signed)
Visit Information   PATIENT GOALS:   Goals Addressed             This Visit's Progress    Monitor and Manage My Blood Sugar-Diabetes Type 2       Timeframe:  Long-Range Goal Priority:  High Start Date:  05/22/21                           Expected End Date:   11/21/21                    Follow Up Date 06/23/21   Patient Goals: - report receipt of new CBG machine from her insurance(arriving via mail). - check blood sugars as recommended by your provider and contact for low blood sugars or consistently high blood sugar.  Follow the Rule blood sugars less than 41m/dl: STEP  1:  Take 15 grams of carbohydrates when your blood sugar is low, which includes:              3-4 glucose tabs or             3-4 oz of juice or regular soda or             One tube of glucose gel STEP 2:  Recheck blood sugar in 15 minutes STEP 3:  If your blood sugar is still low at the 15 minute recheck ---then, go back to STEP 1 and treat again with another 15 grams of carbohydrates - enter blood sugar readings and medication or insulin into daily log - plan to eat healthy: minimize simple carbohydrates, eat more grains, fruits and vegetables, lean meats and healthy fats. - increase exercise per provider recommendation. - review education on signs/symptoms of hypoglycemia and hyperglycemia and exercise booklet. Plan to discuss at next telephone call - attend provider visits as scheduled. - Plan to discuss any questions concerns regarding medications and diabetes management with embedded pharmacist   Why is this important?   Checking your blood sugar at home helps to keep it from getting very high or very low.  Writing the results in a diary or log helps the doctor know how to care for you.  Your blood sugar log should have the time, date and the results.  Also, write down the amount of insulin or other medicine that you take.  Other information, like what you ate, exercise done and how you were feeling, will  also be helpful.     Notes:          Consent to CCM Services: Ms. TRobbenwas given information about Chronic Care Management services today including:  CCM service includes personalized support from designated clinical staff supervised by her physician, including individualized plan of care and coordination with other care providers 24/7 contact phone numbers for assistance for urgent and routine care needs. Service will only be billed when office clinical staff spend 20 minutes or more in a month to coordinate care. Only one practitioner may furnish and bill the service in a calendar month. The patient may stop CCM services at any time (effective at the end of the month) by phone call to the office staff. The patient will be responsible for cost sharing (co-pay) of up to 20% of the service fee (after annual deductible is met).  Patient agreed to services and verbal consent obtained.   Patient verbalizes understanding of instructions provided today and agrees to [place] in [place]  Telephone follow up appointment with care management team member scheduled for: The patient has been provided with contact information for the care management team and has been advised to call with any health related questions or concerns.   Thea Silversmith, RN, MSN, BSN, CCM Care Management Coordinator [facility] [phone]   CLINICAL CARE PLAN: Patient Care Plan: Diabetes Type 2 (Adult)     Problem Identified: Long term care plan for management of Diabetes in a patient with cardiovascular disease   Priority: High     Long-Range Goal: Disease progression of Diabetes maintained or minimized.   Start Date: 05/22/2021  Expected End Date: 11/21/2021  This Visit's Progress: On track  Priority: High  Note:   Objective:  Lab Results  Component Value Date   HGBA1C 7.1 (H) 04/30/2021   Lab Results  Component Value Date   CREATININE 1.38 (H) 04/30/2021   CREATININE 1.16 (H)  03/02/2021   CREATININE 1.14 (H) 09/02/2020   Lab Results  Component Value Date   EGFR 49 (L) 03/02/2021  Current Barriers:  Knowledge Deficits related to long term management of Diabetes in a patient with history of cardiovascular disease(HTN, CHF, a-fib, HLD). Client reports feeling, "Fantastic". She states she found her old meter  on Friday 05/22/21, and that after eating, her blood sugar was 157. She states she is checking her blood sugar at least three times/day. She states, "that is exactly what was wrong, my sugar was low". She reports her highest blood sugar was 169 (after supper). Client with questions regarding target blood sugar range. RNCM will complete referral to embedded pharmacist to assist with: diabetes management; medication review; answer any questions regarding medication effects. Last blood pressure per PCP office visit on 04/30/21: 126/84 Heart rate 80. Weight 189 pounds. 04/30/21 A1C 7.1 increased from 5.9 (09/02/21); triglyceride 165; HDL 53.9, microalbumin 1.3 increased from 0.9 (09/02/20). Client reports she has increased her exercise and is walking around the home 4-5 times/day. Client reports she is feeling well. She reports a planned trip to the beach this week. Case Manager Clinical Goal(s):  patient will demonstrate improved adherence to prescribed treatment plan for diabetes self care/management as evidenced by: daily monitoring and recording of CBG  adherence to ADA/ carb modified diet adherence to prescribed medication regimen contacting provider for new or worsened symptoms or questions Interventions:  Collaboration with Carollee Herter, Alferd Apa, DO regarding development and update of comprehensive plan of care as evidenced by provider attestation and co-signature Inter-disciplinary care team collaboration (see longitudinal plan of care) Reviewed medications with patient Reviewed signs/symptoms of hypoglycemia and hyperglycemia RNCM encouraged client to call primary care for  questions and concerns  Discussed plans with patient for ongoing care management follow up and provided patient with direct contact information for care management team Patient Goals: - report receipt of new CBG machine from her insurance(arriving via mail). - check blood sugars as recommended by your provider and contact for low blood sugars or consistently high blood sugar.  Follow the Rule blood sugars less than 79m/dl: STEP  1:  Take 15 grams of carbohydrates when your blood sugar is low, which includes:              3-4 glucose tabs or             3-4 oz of juice or regular soda or             One tube of glucose gel STEP 2:  Recheck blood sugar  in 15 minutes STEP 3:  If your blood sugar is still low at the 15 minute recheck ---then, go back to STEP 1 and treat again with another 15 grams of carbohydrates - enter blood sugar readings and medication or insulin into daily log - plan to eat healthy: minimize simple carbohydrates, eat more grains, fruits and vegetables, lean meats and healthy fats. - increase exercise per provider recommendation. - review education on signs/symptoms of hypoglycemia and hyperglycemia and exercise booklet. Plan to discuss at next telephone call - attend provider visits as scheduled. - Plan to discuss any questions concerns regarding medications and diabetes management with embedded pharmacist Follow Up Plan: Telephone follow up appointment with care management team member scheduled for: 06/23/21 The patient has been provided with contact information for the care management team and has been advised to call with any health related questions or concerns.

## 2021-05-25 NOTE — Chronic Care Management (AMB) (Signed)
Chronic Care Management   CCM RN Visit Note  05/25/2021 Name: [name] MRN: [id number] DOB: 09/03/1946  Subjective: [name] is a 74 y.o. year old female who is a primary care patient of Lowne Chase, Yvonne R, DO. The care management team was consulted for assistance with disease management and care coordination needs.    Engaged with patient by telephone for follow up visit in response to provider referral for case management and/or care coordination services.   Consent to Services:  The patient was given information about Chronic Care Management services, agreed to services, and gave verbal consent prior to initiation of services.  Please see initial visit note for detailed documentation.   Patient agreed to services and verbal consent obtained.   Assessment: Review of patient past medical history, allergies, medications, health status, including review of consultants reports, laboratory and other test data, was performed as part of comprehensive evaluation and provision of chronic care management services.   SDOH (Social Determinants of Health) assessments and interventions performed:  SDOH Interventions    Flowsheet Row Most Recent Value  SDOH Interventions   Food Insecurity Interventions Intervention Not Indicated  Transportation Interventions Intervention Not Indicated        CCM Care Plan  Allergies  Allergen Reactions   Oxycodone Itching   Vicodin [Hydrocodone-Acetaminophen] Itching   Latex Itching and Rash   Neomycin-Bacitracin Zn-Polymyx Other (See Comments)    Red , swollen eye   Sulfonamide Derivatives Other (See Comments)    Just had reaction when taking during pregnancy- last child birth 1972    Outpatient Encounter Medications as of 05/25/2021  Medication Sig   ALPRAZolam (XANAX) 0.5 MG tablet TAKE 1 TABLET BY MOUTH THREE TIMES A DAY AS NEEDED FOR ANXIETY   amoxicillin (AMOXIL) 500 MG tablet TAKE 4 TABLETS BY MOUTH 1 HOUR PRIOR TO DENTAL WORK,  INCLUDING CLEANINGS (Patient not taking: Reported on 04/30/2021)   Ascorbic Acid (VITAMIN C) 1000 MG tablet Take 1,000 mg by mouth daily.   carvedilol (COREG) 25 MG tablet TAKE 1 TABLET BY MOUTH TWICE A DAY WITH MEALS   citalopram (CELEXA) 20 MG tablet Take 3 tablets (60 mg total) by mouth daily.   COVID-19 mRNA vaccine, Moderna, (MODERNA COVID-19 VACCINE) 100 MCG/0.5ML injection Inject into the muscle.   [employer] ASPIRIN ADULT LOW DOSE 81 MG chewable tablet CHEW 1 TABLET BY MOUTH DAILY.   empagliflozin (JARDIANCE) 10 MG TABS tablet TAKE 1 TABLET(10 MG) BY MOUTH DAILY.   fluticasone (FLONASE) 50 MCG/ACT nasal spray USE 2 SPRAYS IN EACH NOSTIRL EVERY DAY   furosemide (LASIX) 20 MG tablet TAKE 1 TABLET BY MOUTH EVERY DAY   furosemide (LASIX) 40 MG tablet Take 0.5 tablets (20 mg total) by mouth daily.   hyoscyamine (LEVSIN SL) 0.125 MG SL tablet TAKE 1 TABLET BY MOUTH TWICE A DAY AS NEEDED FOR CRAMPING OR DIARRHEA OR LOOSE STOOLS   losartan (COZAAR) 25 MG tablet TAKE 1/2 TABLET BY MOUTH AT BEDTIME.   Omega-3 Fatty Acids (FISH OIL) 1000 MG CAPS Take 1,000 mg by mouth daily.    Potassium Chloride ER 20 MEQ TBCR TAKE 1 TABLET BY MOUTH EVERY DAY   Probiotic Product (PRO-BIOTIC BLEND PO) Take 1 tablet by mouth daily.    rosuvastatin (CRESTOR) 20 MG tablet Take 1 tablet (20 mg total) by mouth daily.   Semaglutide,0.25 or 0.5MG/DOS, (OZEMPIC, 0.25 OR 0.5 MG/DOSE,) 2 MG/1.5ML SOPN Inject 0.25 mg weekly for 4 weeks and then increase to 0.5 mg thereafter.     Facility-Administered Encounter Medications as of 05/25/2021  Medication   diclofenac sodium (VOLTAREN) 1 % transdermal gel 2 g    Patient Active Problem List   Diagnosis Date Noted   Preventative health care 04/30/2021   Hyperlipidemia associated with type 2 diabetes mellitus ([place]) 09/02/2020   Uncontrolled type 2 diabetes mellitus with hyperglycemia ([place]) 09/02/2020   Acute on chronic combined systolic and diastolic CHF (congestive heart failure) ([place])  01/29/2020   S/P TAVR (transcatheter aortic valve replacement) 01/29/2020   Type 2 diabetes mellitus with complication (HCC)    Severe aortic stenosis    CKD (chronic kidney disease) stage 3, GFR 30-59 ml/min (HCC)    ICD (implantable cardioverter-defibrillator) in place 08/15/2019   Ventricular tachycardia ([place])    Anemia 06/27/2019   Severe obesity (BMI 35.0-35.9 with comorbidity) ([place]) 12/08/2018   DM (diabetes mellitus) type II uncontrolled, periph vascular disorder ([place]) 04/24/2015   Paroxysmal atrial flutter ([place]) 10/07/2013   GERD 01/15/2011   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   Start Date: 05/22/2021  Expected End Date: 11/21/2021  This Visit's Progress: On track  Priority: High  Note:   Objective:  Lab Results  Component Value Date   HGBA1C 7.1 (H) 04/30/2021   Lab Results  Component Value Date   CREATININE 1.38 (H) 04/30/2021   CREATININE 1.16 (H) 03/02/2021   CREATININE 1.14 (H) 09/02/2020   Lab Results  Component Value Date   EGFR 49 (L) 03/02/2021  Current Barriers:  Knowledge Deficits related to long term management of Diabetes in a patient with history of cardiovascular disease(HTN, CHF, a-fib, HLD). Client reports feeling, "Fantastic". She states she found her old meter  on Friday 05/22/21, and that after eating, her blood sugar was 157. She states she is checking her blood sugar at least  three times/day. She states, "that is exactly what was wrong, my sugar was low". She reports her highest blood sugar was 169 (after supper). Client with questions regarding target blood sugar range. RNCM will complete referral to embedded pharmacist to assist with: diabetes management; medication review; answer any questions regarding medication effects. Last blood pressure per PCP office visit on 04/30/21: 126/84 Heart rate 80. Weight 189 pounds. 04/30/21 A1C 7.1 increased from 5.9 (09/02/21); triglyceride 165; HDL 53.9, microalbumin 1.3 increased from 0.9 (09/02/20). Client reports she has increased her exercise and is walking around the home 4-5 times/day. Client reports she is feeling well. She reports a planned trip to the beach this week. Case Manager Clinical Goal(s):  patient will demonstrate improved adherence to prescribed treatment plan for diabetes self care/management as evidenced by: daily monitoring and recording of CBG  adherence to ADA/ carb modified diet adherence to prescribed medication regimen contacting provider for new or worsened symptoms or questions Interventions:  Collaboration with [place], DO regarding development and update of comprehensive plan of care as evidenced by provider attestation and co-signature  Inter-disciplinary care team collaboration (see longitudinal plan of care) Reviewed medications with patient Reviewed signs/symptoms of hypoglycemia and hyperglycemia RNCM encouraged client to call primary care for questions and concerns  Discussed plans with patient for ongoing care management follow up and provided patient with direct contact information for care management team Patient Goals: - report receipt of new CBG machine from her insurance(arriving via mail). - check blood sugars as recommended by your provider and contact for low blood sugars or consistently high blood sugar.  Follow the Rule blood sugars less than 70mg/dl: STEP  1:  Take 15 grams of  carbohydrates when your blood sugar is low, which includes:              3-4 glucose tabs or             3-4 oz of juice or regular soda or             One tube of glucose gel STEP 2:  Recheck blood sugar in 15 minutes STEP 3:  If your blood sugar is still low at the 15 minute recheck ---then, go back to STEP 1 and treat again with another 15 grams of carbohydrates - enter blood sugar readings and medication or insulin into daily log - plan to eat healthy: minimize simple carbohydrates, eat more grains, fruits and vegetables, lean meats and healthy fats. - increase exercise per provider recommendation. - review education on signs/symptoms of hypoglycemia and hyperglycemia and exercise booklet. Plan to discuss at next telephone call - attend provider visits as scheduled. - Plan to discuss any questions concerns regarding medications and diabetes management with embedded pharmacist Follow Up Plan: Telephone follow up appointment with care management team member scheduled for: 06/23/21 The patient has been provided with contact information for the care management team and has been advised to call with any health related questions or concerns.       Plan:Telephone follow up appointment with care management team member scheduled for:  06/23/21 and The patient has been provided with contact information for the care management team and has been advised to call with any health related questions or concerns.   Juana Wallace, RN, MSN, BSN, CCM Care Management Coordinator [facility] [phone]          

## 2021-05-26 ENCOUNTER — Other Ambulatory Visit: Payer: Self-pay

## 2021-05-26 DIAGNOSIS — IMO0002 Reserved for concepts with insufficient information to code with codable children: Secondary | ICD-10-CM

## 2021-05-26 LAB — CUP PACEART REMOTE DEVICE CHECK
Battery Remaining Longevity: 80 mo
Battery Voltage: 2.99 V
Brady Statistic AP VP Percent: 99.46 %
Brady Statistic AP VS Percent: 0.11 %
Brady Statistic AS VP Percent: 0.43 %
Brady Statistic AS VS Percent: 0 %
Brady Statistic RA Percent Paced: 99.55 %
Brady Statistic RV Percent Paced: 99.67 %
Date Time Interrogation Session: 20220627043824
HighPow Impedance: 88 Ohm
Implantable Lead Implant Date: 20071016
Implantable Lead Implant Date: 20071016
Implantable Lead Implant Date: 20200730
Implantable Lead Location: 753859
Implantable Lead Location: 753860
Implantable Lead Location: 753860
Implantable Lead Model: 4092
Implantable Lead Model: 5594
Implantable Lead Model: 6935
Implantable Pulse Generator Implant Date: 20200730
Lead Channel Impedance Value: 380 Ohm
Lead Channel Impedance Value: 494 Ohm
Lead Channel Impedance Value: 551 Ohm
Lead Channel Pacing Threshold Amplitude: 0.625 V
Lead Channel Pacing Threshold Amplitude: 0.625 V
Lead Channel Pacing Threshold Pulse Width: 0.4 ms
Lead Channel Pacing Threshold Pulse Width: 0.4 ms
Lead Channel Sensing Intrinsic Amplitude: 26.375 mV
Lead Channel Sensing Intrinsic Amplitude: 26.375 mV
Lead Channel Sensing Intrinsic Amplitude: 3 mV
Lead Channel Sensing Intrinsic Amplitude: 3 mV
Lead Channel Setting Pacing Amplitude: 1.5 V
Lead Channel Setting Pacing Amplitude: 2.5 V
Lead Channel Setting Pacing Pulse Width: 0.4 ms
Lead Channel Setting Sensing Sensitivity: 0.3 mV

## 2021-05-26 MED ORDER — TRUE METRIX BLOOD GLUCOSE TEST VI STRP: ORAL_STRIP | 12 refills | Status: DC

## 2021-05-26 MED ORDER — TRUE METRIX LEVEL 1 LOW VI SOLN: 1.0000 | Freq: Two times a day (BID) | 3 refills | Status: AC

## 2021-05-26 MED ORDER — TRUE METRIX METER DEVI: 1.0000 | Freq: Two times a day (BID) | 0 refills | Status: AC

## 2021-05-26 MED ORDER — LANCETS 33G MISC: 1.0000 | Freq: Two times a day (BID) | 0 refills | Status: DC

## 2021-05-27 NOTE — Telephone Encounter (Signed)
Spoke with patient , she denied appointment , states she is feeling better , and she does have a meter and she is checking her blood sugar .

## 2021-05-27 NOTE — Telephone Encounter (Signed)
LVM for patient to call back & get scheduled

## 2021-06-05 ENCOUNTER — Ambulatory Visit (INDEPENDENT_AMBULATORY_CARE_PROVIDER_SITE_OTHER): Payer: Medicare HMO | Admitting: Pharmacist

## 2021-06-05 DIAGNOSIS — I10 Essential (primary) hypertension: Secondary | ICD-10-CM

## 2021-06-05 DIAGNOSIS — E1151 Type 2 diabetes mellitus with diabetic peripheral angiopathy without gangrene: Secondary | ICD-10-CM

## 2021-06-05 DIAGNOSIS — E785 Hyperlipidemia, unspecified: Secondary | ICD-10-CM

## 2021-06-05 DIAGNOSIS — Z79899 Other long term (current) drug therapy: Secondary | ICD-10-CM

## 2021-06-05 DIAGNOSIS — N1832 Chronic kidney disease, stage 3b: Secondary | ICD-10-CM

## 2021-06-05 DIAGNOSIS — IMO0002 Reserved for concepts with insufficient information to code with codable children: Secondary | ICD-10-CM

## 2021-06-05 NOTE — Chronic Care Management (AMB) (Signed)
Chronic Care Management Pharmacy Note  06/05/2021 Name:  [name] MRN:  [id number] DOB:  10/14/1946  Summary: BG and weight improving with addition of Ozempic. Initially patient experience nausea likely due to starting Ozempic with 0.53m weekly instead of 0.2486m(patient error) Recommendations/Changes made from today's visit: Recommended she continue Ozempic at current dose of 0.86m46meekly since nausea has improved. She is not contact office if has BG <80 or GI symptoms are bothersome.  Patient education provided for medications, diabetes and BG goals, CHF, HTN and hyperlipidemia.   Plan: Will have pharmacy team f/u by phone with patient to check nausea / BG in 2 to 4 weeks.  Clinical pharmacist to follow up in 2 to 3 months unless needed sooner.   Subjective: [name] an 74 9o. year old female who is a primary patient of LowAnn HeldO.  The CCM team was consulted for assistance with disease management and care coordination needs.    Engaged with patient by telephone for initial visit in response to provider referral for pharmacy case management and/or care coordination services.   Consent to Services:  The patient was given the following information about Chronic Care Management services today, agreed to services, and gave verbal consent: 1. CCM service includes personalized support from designated clinical staff supervised by the primary care provider, including individualized plan of care and coordination with other care providers 2. 24/7 contact phone numbers for assistance for urgent and routine care needs. 3. Service will only be billed when office clinical staff spend 20 minutes or more in a month to coordinate care. 4. Only one practitioner may furnish and bill the service in a calendar month. 5.The patient may stop CCM services at any time (effective at the end of the month) by phone call to the office staff. 6. The patient will be responsible for cost  sharing (co-pay) of up to 20% of the service fee (after annual deductible is met). Patient agreed to services and consent obtained.  Patient Care Team: LowCarollee HertervoAlferd ApaO as PCP - General (Family Medicine) Croitoru, MihDani GobbleD as PCP - Cardiology (Cardiology) WalLuretha RuedN as Case Manager BarCyndia BentryFernande BoydenD as Consulting Physician (Cardiothoracic Surgery) TanRoyston CowperDS (Dentistry) TanMarygrace DroughtD as Consulting Physician (Ophthalmology) EckCherre RobinsharmD (Pharmacist)  Recent office visits: 04/30/2021 - PCP (Dr LowEtter SjogrenPE. restarted hyoscamine as needed for abdominal pain  Recent consult visits: 03/02/2021 - Cardio (Dr CroSallyanne Kuster/U CHF, HTN, TVAR, dysrythmias. No med changes.  01/28/2021 - Cardio ([place]) S/p TVAR. No med changes.   Hospital visits: None in previous 6 months  Objective:  Lab Results  Component Value Date   CREATININE 1.38 (H) 04/30/2021   CREATININE 1.16 (H) 03/02/2021   CREATININE 1.14 (H) 09/02/2020    Lab Results  Component Value Date   HGBA1C 7.1 (H) 04/30/2021   Last diabetic Eye exam:  Lab Results  Component Value Date/Time   HMDIABEYEEXA No Retinopathy 10/08/2015 12:00 AM    Last diabetic Foot exam: No results found for: HMDIABFOOTEX      Component Value Date/Time   CHOL 170 04/30/2021 1032   TRIG 165.0 (H) 04/30/2021 1032   HDL 53.90 04/30/2021 1032   CHOLHDL 3 04/30/2021 1032   VLDL 33.0 04/30/2021 1032   LDLCALC 83 04/30/2021 1032   LDLCALC 65 09/02/2020 1134   LDLDIRECT 217.7 09/13/2011 1535    Hepatic Function Latest Ref Rng & Units 04/30/2021 09/02/2020  01/25/2020  Total Protein 6.0 - 8.3 g/dL 6.9 7.4 7.5  Albumin 3.5 - 5.2 g/dL 4.2 - 3.2(L)  AST 0 - 37 U/L _0 ALT 0 - 35 U/L _1 Alk Phosphatase 39 - 117 U/L 68 - 85  Total Bilirubin 0.2 - 1.2 mg/dL 0.4 0.3 0.6  Bilirubin, Direct 0.0 - 0.3 mg/dL - - -    Lab Results  Component Value Date/Time   TSH 0.893 06/28/2019 02:13 AM   TSH  0.986 08/22/2013 04:10 PM   TSH 0.96 12/09/2010 10:31 AM    CBC Latest Ref Rng & Units 04/30/2021 02/01/2020 01/31/2020  WBC 4.0 - 10.5 K/uL 5.1 4.0 4.2  Hemoglobin 12.0 - 15.0 g/dL 11.9(L) 9.6(L) 10.2(L)  Hematocrit 36.0 - 46.0 % 35.2(L) 29.5(L) 31.2(L)  Platelets 150.0 - 400.0 K/uL 120.0(L) 92(L) 88(L)    Lab Results  Component Value Date/Time   VD25OH 13 (L) 12/09/2010 08:16 PM    Clinical ASCVD: No  The 10-year ASCVD risk score Mikey Bussing DC Jr., et al., 2013) is: 32.1%   Values used to calculate the score:     Age: 89 years     Sex: Female     Is Non-Hispanic African American: No     Diabetic: Yes     Tobacco smoker: No     Systolic Blood Pressure: 094 mmHg     Is BP treated: Yes     HDL Cholesterol: 53.9 mg/dL     Total Cholesterol: 170 mg/dL    Other: (CHADS2VASc if Afib, PHQ9 if depression, MMRC or CAT for COPD, ACT, DEXA)  Social History   Tobacco Use  Smoking Status Former   Pack years: 0.00   Types: Cigarettes   Quit date: 09/04/1993   Years since quitting: 27.7  Smokeless Tobacco Never   BP Readings from Last 3 Encounters:  04/30/21 126/84  03/02/21 130/78  01/28/21 120/80   Pulse Readings from Last 3 Encounters:  04/30/21 80  03/02/21 64  01/28/21 75   Wt Readings from Last 3 Encounters:  04/30/21 189 lb 12.8 oz (86.1 kg)  03/02/21 185 lb (83.9 kg)  01/28/21 184 lb 12.8 oz (83.8 kg)    Assessment: Review of patient past medical history, allergies, medications, health status, including review of consultants reports, laboratory and other test data, was performed as part of comprehensive evaluation and provision of chronic care management services.   SDOH:  (Social Determinants of Health) assessments and interventions performed:  SDOH Interventions    Flowsheet Row Most Recent Value  SDOH Interventions   Financial Strain Interventions Intervention Not Indicated       CCM Care Plan  Allergies  Allergen Reactions   Oxycodone Itching   Vicodin  [Hydrocodone-Acetaminophen] Itching   Latex Itching and Rash   Neomycin-Bacitracin Zn-Polymyx Other (See Comments)    Red , swollen eye   Sulfonamide Derivatives Other (See Comments)    Just had reaction when taking during pregnancy- last child birth 33    Medications Reviewed Today     Reviewed by [name], PharmD (Pharmacist) on 06/05/21 at 1001  Med List Status: <None>   Medication Order Taking? Sig Documenting Provider Last Dose Status Informant  ALPRAZolam (XANAX) 0.5 MG tablet [id number] Yes TAKE 1 TABLET BY MOUTH THREE TIMES A DAY AS NEEDED FOR ANXIETY Carollee Herter, Alferd Apa, DO Taking Active   amoxicillin (AMOXIL) 500 MG tablet [id number] Yes TAKE 4 TABLETS BY MOUTH 1 HOUR PRIOR TO DENTAL WORK, INCLUDING CLEANINGS  Sherren Mocha, MD Taking Active   Ascorbic Acid (VITAMIN C) 1000 MG tablet [id number] Yes Take 1,000 mg by mouth daily. [provider] Taking Active Self  Blood Glucose Calibration (TRUE METRIX LEVEL 1) Low SOLN [id number] Yes 1 each by In Vitro route in the morning and at bedtime. [name], DO Taking Active   Blood Glucose Monitoring Suppl (TRUE METRIX METER) DEVI [id number] Yes 1 each by Does not apply route in the morning and at bedtime. Roma Schanz R, DO Taking Active   carvedilol (COREG) 25 MG tablet [id number] Yes TAKE 1 TABLET BY MOUTH TWICE A DAY WITH MEALS Croitoru, Mihai, MD Taking Active   citalopram (CELEXA) 20 MG tablet [id number] Yes Take 20 mg by mouth daily. Along with 33m to equal 629mdaily [provider] Taking Active   citalopram (CELEXA) 40 MG tablet [id number] Take 40 mg by mouth daily. Along with 2026mo equal 4m79mily [provider] Taking Active   [facility] ASPIRIN ADULT LOW DOSE 81 MG chewable tablet [id number] CHEW 1 TABLET BY MOUTH DAILY. Croitoru, Mihai, MD Taking Active   diclofenac sodium (VOLTAREN) 1 % transdermal gel 2 g [id number] HerteronAlferd Apa  Active   empagliflozin  (JARDIANCE) 10 MG TABS tablet [id number] TAKE 1 TABLET(10 MG) BY MOUTH DAILY. Croitoru, Mihai, MD Taking Active   fluticasone (FLOMerit Health Rankin MCG/ACT nasal spray [id number] USE 2 SPRAYS IN EACHAsante [facility] CenterRY DAY LownRoma SchanzDO Taking Active Self  furosemide (LASIX) 20 MG tablet [id number] TAKE 1 TABLET BY MOUTH EVERY DAY Croitoru, Mihai, MD Taking Active   furosemide (LASIX) 40 MG tablet [id number] Take 0.5 tablets (20 mg total) by mouth daily.  Patient taking differently: Take 40 mg by mouth daily.   LownRoma SchanzDO Taking Active   glucose blood (TRUE METRIX BLOOD GLUCOSE TEST) test strip [id number] Use as instructed LownCarollee HerteronAlferd Apa Taking Active   hyoscyamine (LEVSIN SL) 0.125 MG SL tablet [id number] TAKE 1 TABLET BY MOUTH TWICE A DAY AS NEEDED FOR CRAMPING OR DIARRHEA OR LOOSE STOOLS LownAnn Held Taking Active   Lancets 33G MISC [id number] 1 each by Does not apply route in the morning and at bedtime. LownCarollee HerteronKendrick FriesDO Taking Active   losartan (COZAAR) 25 MG tablet [id number] TAKE 1/2 TABLET BY MOUTH AT BEDTIME. Croitoru, Mihai, MD Taking Active   Omega-3 Fatty Acids (FISH OIL) 1000 MG CAPS [id number] Take 1,000 mg by mouth daily.  [provider] Taking Active Self  Potassium Chloride ER 20 MEQ TBCR [id number] TAKE 1 TABLET BY MOUTH EVERY DAY Croitoru, Mihai, MD Taking Active   Probiotic Product (PRO-BIOTIC BLEND PO) [id number] Take 1 tablet by mouth daily.  [provider] Taking Active Self  rosuvastatin (CRESTOR) 20 MG tablet [id number] Take 1 tablet (20 mg total) by mouth daily. LownAnn Held Taking Active   Semaglutide,0.25 or 0.5MG/DOS, (OZEMPIC, 0.25 OR 0.5 MG/DOSE,) 2 MG/1.5ML SOPN [id number] Inject 0.25 mg weekly for 4 weeks and then increase to 0.5 mg thereafter. LownAnn Held Taking Active            Med Note (ECKAntony ContrasMMY B   Fri Jun 05, 2021  9:35 AM)  Patient started with 0.5mg 31mkly             Patient Active Problem  List   Diagnosis Date Noted   Preventative health care 04/30/2021   Hyperlipidemia associated with type 2 diabetes mellitus ([place]) 09/02/2020   Uncontrolled type 2 diabetes mellitus with hyperglycemia ([place]) 09/02/2020   Acute on chronic combined systolic and diastolic CHF (congestive heart failure) ([place]) 01/29/2020   S/P TAVR (transcatheter aortic valve replacement) 01/29/2020   Type 2 diabetes mellitus with complication (HCC)    Severe aortic stenosis    CKD (chronic kidney disease) stage 3, GFR 30-59 ml/min (HCC)    ICD (implantable cardioverter-defibrillator) in place 08/15/2019   Ventricular tachycardia ([place])    Anemia 06/27/2019   Severe obesity (BMI 35.0-35.9 with comorbidity) ([place]) 12/08/2018   DM (diabetes mellitus) type II uncontrolled, periph vascular disorder ([place]) 04/24/2015   Paroxysmal atrial flutter ([place]) 10/07/2013   GERD 01/15/2011   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, status post graft repair the ascending aorta 10/24/2007   Hyperlipidemia LDL goal <100 05/23/2007   Anxiety 05/23/2007   Primary hypertension 05/23/2007    Immunization History  Administered Date(s) Administered   Fluad Quad(high Dose 65+) 11/12/2019, 09/02/2020   Influenza Split 09/13/2011   Influenza Whole 09/19/2008, 09/09/2009, 10/15/2010   Influenza, High Dose Seasonal PF 10/14/2015   Influenza,inj,Quad PF,6+ Mos 10/11/2013, 08/23/2014   Influenza-Unspecified 09/10/2016   Moderna SARS-COV2 Booster Vaccination 04/30/2021   Moderna Sars-Covid-2 Vaccination 01/15/2020, 02/12/2020   PFIZER(Purple Top)SARS-COV-2 Vaccination 09/29/2020   Pneumococcal Conjugate-13 10/14/2015   Pneumococcal Polysaccharide-23 01/09/2013   Td 09/01/2006   Zoster, Live 12/09/2010    Conditions to be addressed/monitored: CHF, HTN, HLD, DMII, CKD Stage 3, Anxiety, and IBS  Care Plan : General  Pharmacy (Adult)  Updates made by [name], PHARMD since 06/05/2021 12:00 AM     Problem: Type 2 DM; HDL, HTN; CHF; IBS; anxiety   Priority: High     Long-Range Goal: Pharmacy related goal for chronic conditions and medication management   Start Date: 06/05/2021  Priority: High  Note:   Current Barriers:  Unable to maintain control of type 2 DM Education needed regarding type 2 DM Education needed regarding medications and proper administration  Pharmacist Clinical Goal(s):  Over the next 90 days, patient will achieve adherence to monitoring guidelines and medication adherence to achieve therapeutic efficacy achieve control of type 2 DM as evidenced by A1c < 7.0 maintain control of HTN, CHF and hyperlipidemia  as evidenced by maintaining goals mentioned below  through collaboration with PharmD and provider.   Interventions: 1:1 collaboration with Carollee Herter, Alferd Apa, DO regarding development and update of comprehensive plan of care as evidenced by provider attestation and co-signature Inter-disciplinary care team collaboration (see longitudinal plan of care) Comprehensive medication review performed; medication list updated in electronic medical record  Diabetes / Obesity (A1c goal <7.0): Uncontrolled; Last A1c was 7.1% Current treatment: Jardiance 37m daily Ozempic 0.248mweekly for 4 weeks, then 0.55m39mhereafter (started 05/05/2021)  Patient mixed up directions and started Ozempic with 0.55mg60mekly which might explain the nausea and fatigue she felt the first 2 weeks. She reports nausea is improving.  Has lost about 5lbs since starting  Ozempic. Current glucose readings:  Morning BG - 106-130 Highest BG reading in afternoon / post prandial - 157 Had symptoms of low BG twice in 2 weeks - checked BG and was 80 once time and 115 the other. She did drink juice and felt better.  Current meal patterns: Limiting calorie and carbohydrate intake. She drinks juice  with 50% less  sugar from time to time.  Current exercise: Walking in home about 3 times per day for about 10 to 20 minutes (total of 30 to 60 minutes per day)  Interventions:  Reviewed home blood glucose readings and reviewed goals  Fasting blood glucose goal (before meals) = 80 to 130 Blood glucose goal after a meal = less than 180  Discussed potential side effects of her current diabetes medications Recommended since nausea is improving to continue Ozempic 0.41m weekly (can lower dose to 0.247mif needed in future but 0.51m68mill be more likely to also help with weight loss)  Discussed that if she should reach Medicare Coverage gap / cost of medications increase to contact clinical pharmacist for assistance with applying for patient assistance for Ozempic and Jardiance.  Continue to limit sugar and carbohydrates (goal is 45 grams of carbohydrates per meal of less)  Continue to exercise with goal of at least 150 minutes per week.  Hypertension / CHF (HFmrEF) Controlled; Goal - BP <130/80 and to prevent CHF exacerbations and manage symptoms Current treatment: Carvedilol 251m251mice a day Losartan 251mg42make 0.5 tablet = 12.51mg d71my Furosemide 20mg o33mmg - 72m 1 tablet daily depending on weight and swelling Potassium chloride 20 mEq - take 1 tablet daily  Jardiance 10mg dai87mDenies hypotensive/hypertensive symptoms Diastolic CHF EF = 45-50% (377-82%2) NYHA class 1 Interventions:  Encouraged her to continue to check weight daily and report increases of more than 3lbs in 24 hours or 5lbs in 1 week Reviewed each medication above and benefits for heart health Continue current therapy for BP and heart  Hyperlipidemia: Controlled; LDL goal per cardiologist <100 current treatment: Rosuvastatin 20mg dail351mmega 3 Fatty Acids - 1000mg daily39mications previously tried: none noted  Interventions:  Reviewed LDL goal Continue current therapy for lipids / heart health  Anxiety Controlled Current  treatment: Citalopram 20mg and 4063mablet 251mkes 1 tablet of each to equal total daily dose of 60mg  Alprazo60m0.51mg up to 3 ti14m a day if needed for anxiety  Not currently connected with mental health support but patient has been stable for the last year.  Interventions:  Updated med list Recommended continue current therapy for anxiety  Patient Goals/Self-Care Activities Over the next 90 days, patient will:  take medications as prescribed,  check glucose 1 time a day, document, and provide at future appointments,  check blood pressure 1 to 2 time per week, document, and provide at future appointments,  weigh daily, and contact provider if weight gain of more than 3 lbs in 24 hours or 5 lbs in 1 week,  collaborate with provider on medication access solutions when needed target a minimum of 150 minutes of moderate intensity exercise weekly  Follow Up Plan:  (Juana Wallace)Thea Silversmithm will check in with patient again by phone in next 30 to 60 days. The care management team will reach out to the patient again over the next 30  days.      Medication Assistance:  not at this time but will monitor for need later in year for assistance with Jardiance or Ozempic.  [facility]  [id number] [id number] [phone]   [phone] agrees to Care Plan and Follow-up.  Plan: The care management team will reach out to the patient again over the next 30 days. Juana WallaceThea Silversmith  to follow up with patient.  Will have pharmacy team f/u by phone with patient to check nausea / BG in 2 to 4 weeks.  Clinical pharmacist to follow up in 2 to 3 months unless needed sooner  [name], PharmD Clinical Pharmacist [place] [phone]

## 2021-06-05 NOTE — Patient Instructions (Signed)
Visit Information  PATIENT GOALS:  Goals Addressed             This Visit's Progress    Pharmacy and Medication Related Goals       Current Barriers:  Unable to maintain control of type 2 DM Education needed regarding type 2 DM Education needed regarding medications and proper administration  Pharmacist Clinical Goal(s):  Over the next 90 days, patient will achieve adherence to monitoring guidelines and medication adherence to achieve therapeutic efficacy achieve control of diabetes as evidenced by A1c < 7.0 maintain control of blood pressure / heart disease and hyperlipidemia  as evidenced by maintaining goals mentioned below  through collaboration with PharmD and provider.   Interventions: 1:1 collaboration with Carollee Herter, Alferd Apa, DO regarding development and update of comprehensive plan of care as evidenced by provider attestation and co-signature Inter-disciplinary care team collaboration (see longitudinal plan of care) Comprehensive medication review performed; medication list updated in electronic medical record  Diabetes / Weight Management (A1c goal <7.0): Last A1c was 7.1% Current treatment: Jardiance 10mg  daily Ozempic 0.25mg  weekly for 4 weeks, then 0.5mg  thereafter (started 05/05/2021)  Interventions:  Reviewed home blood glucose readings and reviewed goals  Fasting blood glucose goal (before meals) = 80 to 130 Blood glucose goal after a meal = less than 180  Discussed potential side effects of current diabetes medications Recommended since nausea is improving to continue Ozempic 0.5mg  weekly (can lower dose to 0.25mg  if needed in future but 0.5mg  will be more likely to also help with weight loss)  If you should reach Medicare Coverage gap / cost of medications increase,  contact clinical pharmacist for assistance with applying for patient assistance for Ozempic and Jardiance.  Continue to limit sugar and carbohydrates (goal is 45 grams of carbohydrates per meal  of less)  Continue to exercise with goal of at least 150 minutes per week.  Hypertension / Heart Controlled; Goal - BP <130/80 and to prevent CHF exacerbations and manage symptoms Current treatment: Carvedilol 25mg  twice a day Losartan 25mg  - take 0.5 tablet = 12.5mg  daily Furosemide 20mg  or 40mg  - take 1 tablet daily depending on weight and swelling Potassium chloride 20 mEq - take 1 tablet daily  Jardiance 10mg  daily  Interventions:  Continue to check weight daily and report increases of more than 3lbs in 24 hours or 5lbs in 1 week Reviewed each medication above and benefits for heart health Continue to limit sodium intake to <2300 mg per day Continue current therapy for blood pressure and heart  Hyperlipidemia: Controlled; LDL goal  <100 (last LDL was 83) current treatment: Rosuvastatin 20mg  daily  Omega 3 Fatty Acids - 1000mg  daily Interventions:  Reviewed LDL goal Continue current therapy for lipids / heart health  Anxiety Current treatment: Citalopram 20mg  and 40mg  tablet - take 1 tablet of each to equal total daily dose of 60mg   Alprazolam 0.5mg  up to 3 times a day if needed for anxiety  Interventions:  Recommend continue current therapy for anxiety  Patient Goals/Self-Care Activities Over the next 90 days, patient will:  take medications as prescribed,  check glucose 1 time a day, document, and provide at future appointments,  check blood pressure 1 to 2 time per week, document, and provide at future appointments,  weigh daily, and contact provider if weight gain of more than 3 lbs in 24 hours or 5 lbs in 1 week,  collaborate with provider on medication access solutions when needed target a minimum of 150  minutes of moderate intensity exercise weekly  Follow Up Plan:   Pharmacy team will check in with patient again by phone in next 30 to 60 days. The care management team will reach out to the patient again over the next 30  days - Thea Silversmith)          Patient verbalizes understanding of instructions provided today and agrees to view in [place].   Will have pharmacy team f/u by phone with patient to check nausea / BG in 2 to 4 weeks.  Clinical pharmacist to follow up in 2 to 3 months unless needed sooner [name], PharmD Clinical Pharmacist [facility]

## 2021-06-06 ENCOUNTER — Other Ambulatory Visit: Payer: Self-pay | Admitting: Family Medicine

## 2021-06-06 DIAGNOSIS — E1165 Type 2 diabetes mellitus with hyperglycemia: Secondary | ICD-10-CM

## 2021-06-15 NOTE — Progress Notes (Signed)
Remote ICD transmission.   

## 2021-06-21 ENCOUNTER — Other Ambulatory Visit: Payer: Self-pay | Admitting: Family Medicine

## 2021-06-21 DIAGNOSIS — F419 Anxiety disorder, unspecified: Secondary | ICD-10-CM

## 2021-06-22 ENCOUNTER — Other Ambulatory Visit: Payer: Self-pay | Admitting: Family Medicine

## 2021-06-22 DIAGNOSIS — F419 Anxiety disorder, unspecified: Secondary | ICD-10-CM

## 2021-06-22 MED ORDER — ALPRAZOLAM 0.5 MG PO TABS: ORAL_TABLET | ORAL | 0 refills | Status: DC

## 2021-06-22 NOTE — Telephone Encounter (Signed)
Requesting: alprazolam Contract: 09/02/20 UDS: 09/02/20 Last Visit: 04/30/21 Next Visit: none Last Refill: 05/21/21  Please Advise

## 2021-06-23 ENCOUNTER — Telehealth: Payer: Medicare HMO

## 2021-06-26 ENCOUNTER — Ambulatory Visit: Payer: Medicare HMO

## 2021-06-26 DIAGNOSIS — E1151 Type 2 diabetes mellitus with diabetic peripheral angiopathy without gangrene: Secondary | ICD-10-CM

## 2021-06-26 DIAGNOSIS — E785 Hyperlipidemia, unspecified: Secondary | ICD-10-CM

## 2021-06-26 DIAGNOSIS — E1165 Type 2 diabetes mellitus with hyperglycemia: Secondary | ICD-10-CM

## 2021-06-26 DIAGNOSIS — E118 Type 2 diabetes mellitus with unspecified complications: Secondary | ICD-10-CM

## 2021-06-26 DIAGNOSIS — I10 Essential (primary) hypertension: Secondary | ICD-10-CM

## 2021-06-26 DIAGNOSIS — I4892 Unspecified atrial flutter: Secondary | ICD-10-CM

## 2021-06-26 NOTE — Patient Instructions (Signed)
Visit Information  PATIENT GOALS:  Goals Addressed             This Visit's Progress    Monitor and Manage My Blood Sugar-Diabetes Type 2   On track    Timeframe:  Long-Range Goal Priority:  High Start Date:  05/22/21                           Expected End Date:   11/21/21                    Follow Up Date 07/28/21   Patient Goals: - continue to check blood sugars as recommended by your provider and contact for low blood sugars or consistently high blood sugar.  Follow the Rule blood sugars less than '70mg'$ /dl: STEP  1:  Take 15 grams of carbohydrates when your blood sugar is low, which includes:              3-4 glucose tabs or             3-4 oz of juice or regular soda or             One tube of glucose gel STEP 2:  Recheck blood sugar in 15 minutes STEP 3:  If your blood sugar is still low at the 15 minute recheck ---then, go back to STEP 1 and treat again with another 15 grams of carbohydrates - continue to enter blood sugar readings and medication or insulin into daily log - continue to eat healthy: eat more grains, fruits and vegetables, lean meats and healthy fats. - continue to be active and increase exercise per provider recommendation. - attend provider visits as scheduled. - continue to work with clinic pharmacist as scheduled re: medications and diabetes management   Why is this important?   Checking your blood sugar at home helps to keep it from getting very high or very low.  Writing the results in a diary or log helps the doctor know how to care for you.  Your blood sugar log should have the time, date and the results.  Also, write down the amount of insulin or other medicine that you take.  Other information, like what you ate, exercise done and how you were feeling, will also be helpful.     Notes:         Patient verbalizes understanding of instructions provided today and agrees to view in [place].   Telephone follow up appointment with care management  team member scheduled for: 07/28/21 The patient has been provided with contact information for the care management team and has been advised to call with any health related questions or concerns.   Thea Silversmith, RN, MSN, BSN, CCM Care Management Coordinator [facility] [phone]

## 2021-06-26 NOTE — Chronic Care Management (AMB) (Signed)
Chronic Care Management   CCM RN Visit Note  06/26/2021 Name: [name] MRN: [id number] DOB: 1946/01/27  Subjective: [name] is a 75 y.o. year old female who is a primary care patient of [name], DO. The care management team was consulted for assistance with disease management and care coordination needs.    Engaged with patient by telephone for follow up visit in response to provider referral for case management and/or care coordination services.   Consent to Services:  The patient was given information about Chronic Care Management services, agreed to services, and gave verbal consent prior to initiation of services.  Please see initial visit note for detailed documentation.   Patient agreed to services and verbal consent obtained.   Assessment: Review of patient past medical history, allergies, medications, health status, including review of consultants reports, laboratory and other test data, was performed as part of comprehensive evaluation and provision of chronic care management services.   SDOH (Social Determinants of Health) assessments and interventions performed:    CCM Care Plan  Allergies  Allergen Reactions   Oxycodone Itching   Vicodin [Hydrocodone-Acetaminophen] Itching   Latex Itching and Rash   Neomycin-Bacitracin Zn-Polymyx Other (See Comments)    Red , swollen eye   Sulfonamide Derivatives Other (See Comments)    Just had reaction when taking during pregnancy- last child birth 55    Outpatient Encounter Medications as of 06/26/2021  Medication Sig Note   ALPRAZolam (XANAX) 0.5 MG tablet 1 po tid prn    amoxicillin (AMOXIL) 500 MG tablet TAKE 4 TABLETS BY MOUTH 1 HOUR PRIOR TO DENTAL WORK, INCLUDING CLEANINGS    Ascorbic Acid (VITAMIN C) 1000 MG tablet Take 1,000 mg by mouth daily.    carvedilol (COREG) 25 MG tablet TAKE 1 TABLET BY MOUTH TWICE A DAY WITH MEALS    citalopram (CELEXA) 20 MG tablet Take 20 mg by mouth daily. Along  with 78m to equal 658mdaily 06/26/2021: Reports takes at night   citalopram (CELEXA) 40 MG tablet Take 40 mg by mouth daily. Along with 2076mo equal 32m42mily 06/26/2021: Takes in the morning   [facility] ASPIRIN ADULT LOW DOSE 81 MG chewable tablet CHEW 1 TABLET BY MOUTH DAILY.    empagliflozin (JARDIANCE) 10 MG TABS tablet TAKE 1 TABLET(10 MG) BY MOUTH DAILY.    fluticasone (FLONASE) 50 MCG/ACT nasal spray USE 2 SPRAYS IN EACH NOSTIRL EVERY DAY 06/26/2021: Reports takes as needed   furosemide (LASIX) 20 MG tablet TAKE 1 TABLET BY MOUTH EVERY DAY    hyoscyamine (LEVSIN SL) 0.125 MG SL tablet TAKE 1 TABLET BY MOUTH TWICE A DAY AS NEEDED FOR CRAMPING OR DIARRHEA OR LOOSE STOOLS    losartan (COZAAR) 25 MG tablet TAKE 1/2 TABLET BY MOUTH AT BEDTIME.    Omega-3 Fatty Acids (FISH OIL) 1000 MG CAPS Take 1,000 mg by mouth daily.     Potassium Chloride ER 20 MEQ TBCR TAKE 1 TABLET BY MOUTH EVERY DAY    Probiotic Product (PRO-BIOTIC BLEND PO) Take 1 tablet by mouth daily.     rosuvastatin (CRESTOR) 20 MG tablet Take 1 tablet (20 mg total) by mouth daily.    Semaglutide,0.25 or 0.5MG/DOS, (OZEMPIC, 0.25 OR 0.5 MG/DOSE,) 2 MG/1.5ML SOPN INJECT 0.25 MG WEEKLY FOR 4 WEEKS AND THEN INCREASE TO 0.5 MG THEREAFTER.    Blood Glucose Calibration (TRUE METRIX LEVEL 1) Low SOLN 1 each by In Vitro route in the morning and at bedtime.  Blood Glucose Monitoring Suppl (TRUE METRIX METER) DEVI 1 each by Does not apply route in the morning and at bedtime.    furosemide (LASIX) 40 MG tablet Take 0.5 tablets (20 mg total) by mouth daily. (Patient taking differently: Take 40 mg by mouth daily.)    glucose blood (TRUE METRIX BLOOD GLUCOSE TEST) test strip Use as instructed    Lancets 33G MISC 1 each by Does not apply route in the morning and at bedtime.    Facility-Administered Encounter Medications as of 06/26/2021  Medication   diclofenac sodium (VOLTAREN) 1 % transdermal gel 2 g    Patient Active Problem List   Diagnosis  Date Noted   Preventative health care 04/30/2021   Hyperlipidemia associated with type 2 diabetes mellitus ([place]) 09/02/2020   Uncontrolled type 2 diabetes mellitus with hyperglycemia ([place]) 09/02/2020   Acute on chronic combined systolic and diastolic CHF (congestive heart failure) ([place]) 01/29/2020   S/P TAVR (transcatheter aortic valve replacement) 01/29/2020   Type 2 diabetes mellitus with complication (HCC)    Severe aortic stenosis    CKD (chronic kidney disease) stage 3, GFR 30-59 ml/min (HCC)    ICD (implantable cardioverter-defibrillator) in place 08/15/2019   Ventricular tachycardia ([place])    Anemia 06/27/2019   Severe obesity (BMI 35.0-35.9 with comorbidity) ([place]) 12/08/2018   DM (diabetes mellitus) type II uncontrolled, periph vascular disorder ([place]) 04/24/2015   Paroxysmal atrial flutter ([place]) 10/07/2013   GERD 01/15/2011   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   Start Date: 05/22/2021  Expected End Date: 11/21/2021  This Visit's Progress: On track  Recent Progress: On track  Priority: Medium  Note:   Objective:  Lab Results  Component Value Date   HGBA1C 7.1 (H) 04/30/2021   Lab Results  Component Value Date   CREATININE 1.38 (H) 04/30/2021   CREATININE 1.16 (H) 03/02/2021   CREATININE 1.14 (H) 09/02/2020   Lab Results  Component Value Date   EGFR 49 (L) 03/02/2021  Current  Barriers:  Knowledge Deficits related to long term management of Diabetes in a patient with history of cardiovascular disease(HTN, CHF, a-fib, HLD). Client reports feeling, "really, really good". Reports blood sugars have been good. Today Blood sugar as 119 and 1:48 around 1:30 pm. Denies any low blood sugars. states blood pressures have been within normal range for her. She has another planned trip to the beach middle of August. Case Manager Clinical Goal(s):  patient will demonstrate improved adherence to prescribed treatment plan for diabetes self care/management as evidenced by: daily monitoring and recording of CBG  adherence to ADA/ carb modified diet adherence to prescribed medication regimen contacting provider for new or worsened symptoms or questions Interventions:  Collaboration with Carollee Herter, Alferd Apa, DO regarding development and update of comprehensive plan of care as evidenced by provider attestation and co-signature Inter-disciplinary care team collaboration (see longitudinal plan of care) Reviewed medications with  patient Discussed blood sugar readings with client.  Discussed next appointment with primary care due around 10/2021. Client states she will wait and call to schedule this visit. Discussed plans with patient for ongoing care management follow up and provided patient with direct contact information for care management team Patient Goals: - continue to check blood sugars as recommended by your provider and contact for low blood sugars or consistently high blood sugar.  Follow the Rule blood sugars less than 15m/dl: STEP  1:  Take 15 grams of carbohydrates when your blood sugar is low, which includes:              3-4 glucose tabs or             3-4 oz of juice or regular soda or             One tube of glucose gel STEP 2:  Recheck blood sugar in 15 minutes STEP 3:  If your blood sugar is still low at the 15 minute recheck ---then, go back to STEP 1 and treat again with  another 15 grams of carbohydrates - continue to enter blood sugar readings and medication or insulin into daily log - continue to eat healthy: eat more grains, fruits and vegetables, lean meats and healthy fats. - continue to be active and increase exercise per provider recommendation. - attend provider visits as scheduled. - continue to work with clinic pharmacist as scheduled re: medications and diabetes management Follow Up Plan: Telephone follow up appointment with care management team member scheduled for: next month The patient has been provided with contact information for the care management team and has been advised to call with any health related questions or concerns.       Plan:Telephone follow up appointment with care management team member scheduled for:  next month and The patient has been provided with contact information for the care management team and has been advised to call with any health related questions or concerns.   JThea Silversmith RN, MSN, BSN, CCM Care Management Coordinator [phone]

## 2021-07-01 ENCOUNTER — Other Ambulatory Visit: Payer: Self-pay | Admitting: Family Medicine

## 2021-07-01 DIAGNOSIS — E1165 Type 2 diabetes mellitus with hyperglycemia: Secondary | ICD-10-CM

## 2021-07-11 ENCOUNTER — Other Ambulatory Visit: Payer: Self-pay | Admitting: Family Medicine

## 2021-07-11 DIAGNOSIS — IMO0002 Reserved for concepts with insufficient information to code with codable children: Secondary | ICD-10-CM

## 2021-07-11 DIAGNOSIS — E1151 Type 2 diabetes mellitus with diabetic peripheral angiopathy without gangrene: Secondary | ICD-10-CM

## 2021-07-20 ENCOUNTER — Encounter: Payer: Self-pay | Admitting: Family Medicine

## 2021-07-20 ENCOUNTER — Other Ambulatory Visit: Payer: Self-pay | Admitting: Family Medicine

## 2021-07-20 DIAGNOSIS — F419 Anxiety disorder, unspecified: Secondary | ICD-10-CM

## 2021-07-20 MED ORDER — ALPRAZOLAM 0.5 MG PO TABS: ORAL_TABLET | ORAL | 0 refills | Status: DC

## 2021-07-20 NOTE — Telephone Encounter (Signed)
Requesting:alprazolam Contract: 09/02/2020 UDS: 09/02/2020 Last Visit: 04/30/2021 Next Visit: None Last Refill: 06/22/2021 #90 and 0RF  Please Advise

## 2021-07-22 ENCOUNTER — Telehealth: Payer: Self-pay

## 2021-07-22 NOTE — Progress Notes (Signed)
Chronic Care Management Pharmacy Assistant   Name: [name]  MRN: [id number] DOB: July 05, 1946   Reason for Encounter: Disease State Diabetes    Recent office visits:  None noted   Recent consult visits:  None noted   Hospital visits:  None in previous 6 months  Medications: Outpatient Encounter Medications as of 07/22/2021  Medication Sig Note   ALPRAZolam (XANAX) 0.5 MG tablet 1 po tid prn    amoxicillin (AMOXIL) 500 MG tablet TAKE 4 TABLETS BY MOUTH 1 HOUR PRIOR TO DENTAL WORK, INCLUDING CLEANINGS    Ascorbic Acid (VITAMIN C) 1000 MG tablet Take 1,000 mg by mouth daily.    Blood Glucose Calibration (TRUE METRIX LEVEL 1) Low SOLN 1 each by In Vitro route in the morning and at bedtime.    Blood Glucose Monitoring Suppl (TRUE METRIX METER) DEVI 1 each by Does not apply route in the morning and at bedtime.    carvedilol (COREG) 25 MG tablet TAKE 1 TABLET BY MOUTH TWICE A DAY WITH MEALS    citalopram (CELEXA) 20 MG tablet Take 20 mg by mouth daily. Along with '40mg'$  to equal '60mg'$  daily 06/26/2021: Reports takes at night   citalopram (CELEXA) 40 MG tablet Take 40 mg by mouth daily. Along with '20mg'$  to equal '60mg'$  daily 06/26/2021: Takes in the morning   [facility] ASPIRIN ADULT LOW DOSE 81 MG chewable tablet CHEW 1 TABLET BY MOUTH DAILY.    empagliflozin (JARDIANCE) 10 MG TABS tablet TAKE 1 TABLET(10 MG) BY MOUTH DAILY.    fluticasone (FLONASE) 50 MCG/ACT nasal spray USE 2 SPRAYS IN [facility] NOSTIRL EVERY DAY 06/26/2021: Reports takes as needed   furosemide (LASIX) 20 MG tablet TAKE 1 TABLET BY MOUTH EVERY DAY    furosemide (LASIX) 40 MG tablet Take 0.5 tablets (20 mg total) by mouth daily. (Patient taking differently: Take 40 mg by mouth daily.)    glucose blood (TRUE METRIX BLOOD GLUCOSE TEST) test strip Use as instructed    hyoscyamine (LEVSIN SL) 0.125 MG SL tablet TAKE 1 TABLET BY MOUTH TWICE A DAY AS NEEDED FOR CRAMPING OR DIARRHEA OR LOOSE STOOLS    losartan (COZAAR) 25 MG tablet TAKE  1/2 TABLET BY MOUTH AT BEDTIME.    Omega-3 Fatty Acids (FISH OIL) 1000 MG CAPS Take 1,000 mg by mouth daily.     Potassium Chloride ER 20 MEQ TBCR TAKE 1 TABLET BY MOUTH EVERY DAY    Probiotic Product (PRO-BIOTIC BLEND PO) Take 1 tablet by mouth daily.     rosuvastatin (CRESTOR) 20 MG tablet Take 1 tablet (20 mg total) by mouth daily.    Semaglutide,0.25 or 0.'5MG'$ /DOS, (OZEMPIC, 0.25 OR 0.5 MG/DOSE,) 2 MG/1.5ML SOPN INJECT 0.25 MG WEEKLY FOR 4 WEEKS AND THEN INCREASE TO 0.5 MG THEREAFTER.    TRUEplus Lancets 33G MISC TEST BLOOD SUGAR TWICE DAILY IN THE MORNING AND AT BEDTIME    Facility-Administered Encounter Medications as of 07/22/2021  Medication   diclofenac sodium (VOLTAREN) 1 % transdermal gel 2 g  Recent Relevant Labs: Lab Results  Component Value Date/Time   HGBA1C 7.1 (H) 04/30/2021 10:32 AM   HGBA1C 5.9 (H) 09/02/2020 11:34 AM   MICROALBUR 1.3 04/30/2021 10:32 AM   MICROALBUR 0.9 09/02/2020 11:36 AM    Kidney Function Lab Results  Component Value Date/Time   CREATININE 1.38 (H) 04/30/2021 10:32 AM   CREATININE 1.16 (H) 03/02/2021 11:40 AM   CREATININE 1.14 (H) 09/02/2020 11:34 AM   CREATININE 0.74 01/01/2014 01:01 PM  GFR 37.69 (L) 04/30/2021 10:32 AM   GFRNONAA 34 (L) 02/01/2020 02:13 AM   GFRAA 40 (L) 02/01/2020 02:13 AM    Current antihyperglycemic regimen:  Ozempic 0.'5mg'$  weekly, Jardiance '10mg'$  daily What recent interventions/DTPs have been made to improve glycemic control:  N/a Have there been any recent hospitalizations or ED visits since last visit with CPP? No Patient hypoglycemic symptoms, including  Patient hyperglycemic symptoms, including  How often are you checking your blood sugar?  What are your blood sugars ranging?  Fasting:  Before meals:  After meals:  Bedtime:  During the week, how often does your blood glucose drop below 70?  Are you checking your feet daily/regularly?   Adherence Review: Is the patient currently on a STATIN medication?   Is the patient currently on ACE/ARB medication?  Does the patient have >5 day gap between last estimated fill dates?    Patient states that was unavailable to speak at the time of call , the CCM team will connect with her next month.     Star Rating Drugs: Losartan 25 mg last fill 03/26/21 90 DS Rosuvastatin 20 mg last fill 05/26/21 90 DS  Andee Poles, CMA

## 2021-07-26 ENCOUNTER — Other Ambulatory Visit: Payer: Self-pay | Admitting: Family Medicine

## 2021-07-26 DIAGNOSIS — K580 Irritable bowel syndrome with diarrhea: Secondary | ICD-10-CM

## 2021-07-28 ENCOUNTER — Telehealth: Payer: Medicare HMO

## 2021-07-29 ENCOUNTER — Telehealth: Payer: Self-pay | Admitting: *Deleted

## 2021-07-29 NOTE — Chronic Care Management (AMB) (Signed)
  Care Management   Note  07/29/2021 Name: [name] MRN: [id number] DOB: 1946-09-21  [name] is a 74 y.o. year old female who is a primary care patient of [name], DO and is actively engaged with the care management team. I reached out to [name] by phone today to assist with re-scheduling a follow up visit with the RN Case Manager  Follow up plan: Unsuccessful telephone outreach attempt made. A HIPAA compliant phone message was left for the patient providing contact information and requesting a return call.   Julian Hy, [place] Management  Direct Dial: [phone]

## 2021-07-30 ENCOUNTER — Other Ambulatory Visit: Payer: Self-pay | Admitting: Cardiovascular Disease

## 2021-07-30 DIAGNOSIS — I10 Essential (primary) hypertension: Secondary | ICD-10-CM

## 2021-08-05 NOTE — Chronic Care Management (AMB) (Signed)
  Care Management   Note  08/05/2021 Name: [name] MRN: [id number] DOB: [birth year]  [name] is a 75 y.o. year old female who is a primary care patient of [name], DO and is actively engaged with the care management team. I reached out to [name] by phone today to assist with re-scheduling a follow up visit with the RN Case Manager  Follow up plan: Telephone appointment with care management team member scheduled for: 08/10/2021  Julian Hy, [place], [place] [facility]  Direct Dial: [phone]

## 2021-08-10 ENCOUNTER — Telehealth: Payer: Self-pay

## 2021-08-10 ENCOUNTER — Telehealth: Payer: Medicare HMO

## 2021-08-10 NOTE — Telephone Encounter (Signed)
  Care Management   Follow Up Note   08/10/2021 Name: [name] MRN: [id number] DOB: 02/24/1946   Referred by: [name], DO Reason for referral : Chronic Care Management (RNCM follow up)   A second unsuccessful telephone outreach was attempted today. The patient was referred to the case management team for assistance with care management and care coordination.   Follow Up Plan: The care management team will reach out to the patient again over the next 30 days.   Thea Silversmith, RN, MSN, BSN, CCM Care Management Coordinator [facility] [phone]

## 2021-08-11 ENCOUNTER — Ambulatory Visit (INDEPENDENT_AMBULATORY_CARE_PROVIDER_SITE_OTHER): Payer: Medicare HMO | Admitting: Pharmacist

## 2021-08-11 DIAGNOSIS — E118 Type 2 diabetes mellitus with unspecified complications: Secondary | ICD-10-CM

## 2021-08-11 DIAGNOSIS — I10 Essential (primary) hypertension: Secondary | ICD-10-CM

## 2021-08-11 DIAGNOSIS — F419 Anxiety disorder, unspecified: Secondary | ICD-10-CM

## 2021-08-11 DIAGNOSIS — E785 Hyperlipidemia, unspecified: Secondary | ICD-10-CM

## 2021-08-11 NOTE — Patient Instructions (Signed)
Visit Information  PATIENT GOALS:  Goals Addressed             This Visit's Progress    Pharmacy and Medication Related Goals   On track    Current Barriers:  Unable to maintain control of type 2 DM Education needed regarding type 2 DM Education needed regarding medications and proper administration  Pharmacist Clinical Goal(s):  Over the next 90 days, patient will achieve adherence to monitoring guidelines and medication adherence to achieve therapeutic efficacy achieve control of diabetes as evidenced by A1c < 7.0 maintain control of blood pressure / heart disease and hyperlipidemia  as evidenced by maintaining goals mentioned below  through collaboration with PharmD and provider.   Interventions: 1:1 collaboration with Carollee Herter, Alferd Apa, DO regarding development and update of comprehensive plan of care as evidenced by provider attestation and co-signature Inter-disciplinary care team collaboration (see longitudinal plan of care) Comprehensive medication review performed; medication list updated in electronic medical record  Diabetes / Weight Management (A1c goal <7.0): Last A1c was 7.1% Current treatment: Jardiance '10mg'$  daily Ozempic 0.'25mg'$  weekly for 4 weeks, then 0.'5mg'$  thereafter (started 05/05/2021)  Interventions:  Reviewed home blood glucose readings and reviewed goals  Fasting blood glucose goal (before meals) = 80 to 130 Blood glucose goal after a meal = less than 180  Discussed potential side effects of current diabetes medications I sent you an application for Ozempic medication assistance program, please complete the application and return to me at [facility].  Continue to limit sugar and carbohydrates (goal is 45 grams of carbohydrates per meal of less)  Continue to exercise with goal of at least 150 minutes per week.  Hypertension / Heart Controlled; Goal - BP <130/80 and to prevent CHF exacerbations and manage symptoms Current  treatment: Carvedilol '25mg'$  twice a day Losartan '25mg'$  - take 0.5 tablet = 12.'5mg'$  daily Furosemide '20mg'$  or '40mg'$  - take 1 tablet daily depending on weight and swelling Potassium chloride 20 mEq - take 1 tablet daily  Jardiance '10mg'$  daily  Interventions:  Continue to check weight daily and report increases of more than 3lbs in 24 hours or 5lbs in 1 week Reviewed each medication above and benefits for heart health Continue to limit sodium intake to <2300 mg per day Continue current therapy for blood pressure and heart  Hyperlipidemia: Controlled; LDL goal  <100 (last LDL was 83) current treatment: Rosuvastatin '20mg'$  daily  Omega 3 Fatty Acids - '1000mg'$  daily Interventions:  Reviewed LDL goal Continue current therapy for lipids / heart health  Anxiety Current treatment: Citalopram '20mg'$  and '40mg'$  tablet - take 1 tablet of each to equal total daily dose of '60mg'$   Alprazolam 0.'5mg'$  up to 3 times a day if needed for anxiety  Interventions:  Recommend continue current therapy for anxiety  Patient Goals/Self-Care Activities Over the next 90 days, patient will:  take medications as prescribed,  check glucose 1 time a day, document, and provide at future appointments,  check blood pressure 1 to 2 time per week, document, and provide at future appointments,  weigh daily, and contact provider if weight gain of more than 3 lbs in 24 hours or 5 lbs in 1 week,  collaborate with provider on medication access solutions when needed target a minimum of 150 minutes of moderate intensity exercise weekly  Follow Up Plan:   Pharmacy team will check in with patient again by phone in next 30 to 60 days. The care management team will reach out to  the patient again over the next 30  days        The patient verbalized understanding of instructions, educational materials, and care plan provided today and declined offer to receive copy of patient instructions, educational materials, and care plan.   Telephone  follow up appointment with care management team member scheduled for: 30 days  [name], PharmD Clinical Pharmacist [facility] [address] [place] [facility]

## 2021-08-11 NOTE — Chronic Care Management (AMB) (Signed)
Chronic Care Management Pharmacy Note  08/11/2021 Name:  [name] MRN:  [id number] DOB:  1946-11-08  Summary: BG and weight improving with addition of Ozempic. Currently taking Ozempic 0.81m weekly. Patient has reached Medicare coverage gap and cost of Ozempic was $240/30 days recently  Recommendations/Changes made from today's visit: Recommended she continue Ozempic at current dose of 0.535mweekly.  Mailed application for medication assistance for Ozempic.   Subjective: [name] an [age].o. year old female who is a primary patient of [name].  The CCM team was consulted for assistance with disease management and care coordination needs.    Engaged with patient by telephone for follow up visit in response to provider referral for pharmacy case management and/or care coordination services.   Consent to Services:  The patient was given information about Chronic Care Management services, agreed to services, and gave verbal consent prior to initiation of services.  Please see initial visit note for detailed documentation.   Patient Care Team: [name] ApaDO as PCP - General (Family Medicine) Croitoru, MiDani GobbleMD as PCP - Cardiology (Cardiology) WaLuretha RuedRN as Case Manager BaCyndia BentBrFernande BoydenMD as Consulting Physician (Cardiothoracic Surgery) TaRoyston CowperDDS (Dentistry) TaMarygrace DroughtMD as Consulting Physician (Ophthalmology) EcCherre RobinsPharmD (Pharmacist)  Recent office visits: 04/30/2021 - PCP (Dr LoEtter SjogrenCPE. restarted hyoscamine as needed for abdominal pain  Recent consult visits: 03/02/2021 - Cardio (Dr CrSallyanne KusterF/U CHF, HTN, TVAR, dysrythmias. No med changes.  01/28/2021 - Cardio ([place] FarmPAC) S/p TVAR. No med changes.   Hospital visits: None in previous 6 months  Objective:  Lab Results  Component Value Date   CREATININE 1.38 (H) 04/30/2021   CREATININE 1.16 (H) 03/02/2021   CREATININE 1.14 (H)  09/02/2020    Lab Results  Component Value Date   HGBA1C 7.1 (H) 04/30/2021   Last diabetic Eye exam:  Lab Results  Component Value Date/Time   HMDIABEYEEXA No Retinopathy 10/08/2015 12:00 AM    Last diabetic Foot exam: No results found for: HMDIABFOOTEX      Component Value Date/Time   CHOL 170 04/30/2021 1032   TRIG 165.0 (H) 04/30/2021 1032   HDL 53.90 04/30/2021 1032   CHOLHDL 3 04/30/2021 1032   VLDL 33.0 04/30/2021 1032   LDLCALC 83 04/30/2021 1032   LDLCALC 65 09/02/2020 1134   LDLDIRECT 217.7 09/13/2011 1535    Hepatic Function Latest Ref Rng & Units 04/30/2021 09/02/2020 01/25/2020  Total Protein 6.0 - 8.3 g/dL 6.9 7.4 7.5  Albumin 3.5 - 5.2 g/dL 4.2 - 3.2(L)  AST 0 - 37 U/L '14 17 23  ' ALT 0 - 35 U/L '11 15 15  ' Alk Phosphatase 39 - 117 U/L 68 - 85  Total Bilirubin 0.2 - 1.2 mg/dL 0.4 0.3 0.6  Bilirubin, Direct 0.0 - 0.3 mg/dL - - -    Lab Results  Component Value Date/Time   TSH 0.893 06/28/2019 02:13 AM   TSH 0.986 08/22/2013 04:10 PM   TSH 0.96 12/09/2010 10:31 AM    CBC Latest Ref Rng & Units 04/30/2021 02/01/2020 01/31/2020  WBC 4.0 - 10.5 K/uL 5.1 4.0 4.2  Hemoglobin 12.0 - 15.0 g/dL 11.9(L) 9.6(L) 10.2(L)  Hematocrit 36.0 - 46.0 % 35.2(L) 29.5(L) 31.2(L)  Platelets 150.0 - 400.0 K/uL 120.0(L) 92(L) 88(L)    Lab Results  Component Value Date/Time   VD25OH 13 (L) 12/09/2010 08:16 PM    Clinical ASCVD: No  The 10-year  ASCVD risk score (Arnett DK, et al., 2019) is: 32.1%   Values used to calculate the score:     Age: 75 years     Sex: Female     Is Non-Hispanic African American: No     Diabetic: Yes     Tobacco smoker: No     Systolic Blood Pressure: 811 mmHg     Is BP treated: Yes     HDL Cholesterol: 53.9 mg/dL     Total Cholesterol: 170 mg/dL      Social History   Tobacco Use  Smoking Status Former   Types: Cigarettes   Quit date: 09/04/1993   Years since quitting: 27.9  Smokeless Tobacco Never   BP Readings from Last 3 Encounters:   04/30/21 126/84  03/02/21 130/78  01/28/21 120/80   Pulse Readings from Last 3 Encounters:  04/30/21 80  03/02/21 64  01/28/21 75   Wt Readings from Last 3 Encounters:  04/30/21 189 lb 12.8 oz (86.1 kg)  03/02/21 185 lb (83.9 kg)  01/28/21 184 lb 12.8 oz (83.8 kg)    Assessment: Review of patient past medical history, allergies, medications, health status, including review of consultants reports, laboratory and other test data, was performed as part of comprehensive evaluation and provision of chronic care management services.   SDOH:  (Social Determinants of Health) assessments and interventions performed:  SDOH Interventions    Flowsheet Row Most Recent Value  SDOH Interventions   Financial Strain Interventions Other (Comment)  [mailed application for Ozempic medication assistance program.]       CCM Care Plan  Allergies  Allergen Reactions   Oxycodone Itching   Vicodin [Hydrocodone-Acetaminophen] Itching   Latex Itching and Rash   Neomycin-Bacitracin Zn-Polymyx Other (See Comments)    Red , swollen eye   Sulfonamide Derivatives Other (See Comments)    Just had reaction when taking during pregnancy- last child birth 8    Medications Reviewed Today     Reviewed by [name], PharmD (Pharmacist) on 08/11/21 at 1332  Med List Status: <None>   Medication Order Taking? Sig Documenting Provider Last Dose Status Informant  ALPRAZolam (XANAX) 0.5 MG tablet [id number] Yes 1 po tid prn Carollee Herter, Yvonne R, DO Taking Active   amoxicillin (AMOXIL) 500 MG tablet [id number] Yes TAKE 4 TABLETS BY MOUTH 1 HOUR PRIOR TO DENTAL WORK, INCLUDING CLEANINGS Sherren Mocha, MD Taking Active   Ascorbic Acid (VITAMIN C) 1000 MG tablet [id number] Yes Take 1,000 mg by mouth daily. [provider] Taking Active Self  Blood Glucose Calibration (TRUE METRIX LEVEL 1) Low SOLN [id number] Yes 1 each by In Vitro route in the morning and at bedtime. [name], DO Taking  Active   Blood Glucose Monitoring Suppl (TRUE METRIX METER) DEVI [id number] Yes 1 each by Does not apply route in the morning and at bedtime. Roma Schanz R, DO Taking Active   carvedilol (COREG) 25 MG tablet [id number] Yes TAKE 1 TABLET BY MOUTH TWICE A DAY WITH MEALS Croitoru, Mihai, MD Taking Active   citalopram (CELEXA) 20 MG tablet [id number] Yes Take 20 mg by mouth daily. Along with 7m to equal 641mdaily [provider] Taking Active            Med Note (WJuleen ChinaJUDeno Etienne Fri Jun 26, 2021  3:52 PM) Reports takes at night  citalopram (CELEXA) 40 MG tablet [id number] Take 40 mg by mouth daily. Along with 2023mo equal 36m94mily [provider] Taking Active            Med Note Juleen China, Deno Etienne   Fri Jun 26, 2021  3:51 PM) Takes in the morning  [facility] ASPIRIN ADULT LOW DOSE 81 MG chewable tablet [id number] Yes CHEW 1 TABLET BY MOUTH DAILY. Croitoru, Mihai, MD Taking Active   diclofenac sodium (VOLTAREN) 1 % transdermal gel 2 g [id number]   Carollee Herter, Alferd Apa, DO  Active   empagliflozin (JARDIANCE) 10 MG TABS tablet [id number] Yes TAKE 1 TABLET(10 MG) BY MOUTH DAILY. Croitoru, Mihai, MD Taking Active   fluticasone [facility]) 50 MCG/ACT nasal spray [id number] Yes USE 2 SPRAYS IN [facility] EVERY DAY [name], DO Taking Active Self           Med Note Juleen China, Deno Etienne   Fri Jun 26, 2021  3:52 PM) Reports takes as needed  furosemide (LASIX) 20 MG tablet [id number] Yes TAKE 1 TABLET BY MOUTH EVERY DAY Croitoru, Mihai, MD Taking Active   furosemide (LASIX) 40 MG tablet [id number] Yes Take 0.5 tablets (20 mg total) by mouth daily.  Patient taking differently: Take 40 mg by mouth daily.   Roma Schanz R, DO Taking Active   glucose blood (TRUE METRIX BLOOD GLUCOSE TEST) test strip [id number] Yes Use as instructed Carollee Herter, Alferd Apa, DO Taking Active   hyoscyamine (LEVSIN SL) 0.125 MG SL tablet [id number] Yes TAKE 1 TABLET BY MOUTH TWICE A DAY AS NEEDED FOR  CRAMPING OR DIARRHEA OR LOOSE STOOLS Carollee Herter, Yvonne R, DO Taking Active   losartan (COZAAR) 25 MG tablet [id number] Yes TAKE 1/2 TABLET BY MOUTH AT BEDTIME. Croitoru, Mihai, MD Taking Active   Omega-3 Fatty Acids (FISH OIL) 1000 MG CAPS [id number] Yes Take 1,000 mg by mouth daily.  [provider] Taking Active Self  Potassium Chloride ER 20 MEQ TBCR [id number] Yes TAKE 1 TABLET BY MOUTH EVERY DAY Croitoru, Mihai, MD Taking Active   Probiotic Product (PRO-BIOTIC BLEND PO) [id number] Yes Take 1 tablet by mouth daily.  [provider] Taking Active Self  rosuvastatin (CRESTOR) 20 MG tablet [id number] Yes Take 1 tablet (20 mg total) by mouth daily. Roma Schanz R, DO Taking Active   Semaglutide,0.25 or 0.5MG/DOS, (OZEMPIC, 0.25 OR 0.5 MG/DOSE,) 2 MG/1.5ML SOPN [id number] Yes INJECT 0.25 MG WEEKLY FOR 4 WEEKS AND THEN INCREASE TO 0.5 MG THEREAFTER. [name], DO Taking Active   TRUEplus Lancets 33G MISC [id number] Yes TEST BLOOD SUGAR TWICE DAILY IN THE MORNING AND AT BEDTIME [name], DO Taking Active             Patient Active Problem List   Diagnosis Date Noted   Preventative health care 04/30/2021   Hyperlipidemia associated with type 2 diabetes mellitus ([place]) 09/02/2020   Uncontrolled type 2 diabetes mellitus with hyperglycemia ([place]) 09/02/2020   Acute on chronic combined systolic and diastolic CHF (congestive heart failure) ([place]) 01/29/2020   S/P TAVR (transcatheter aortic valve replacement) 01/29/2020   Type 2 diabetes mellitus with complication (HCC)    Severe aortic stenosis    CKD (chronic kidney disease) stage 3, GFR 30-59 ml/min (HCC)    ICD (implantable cardioverter-defibrillator) in place 08/15/2019   Ventricular tachycardia ([place])    Anemia 06/27/2019   Severe obesity (BMI 35.0-35.9 with comorbidity) ([place]) 12/08/2018   DM (diabetes mellitus) type II uncontrolled, periph vascular disorder ([place]) 04/24/2015   Paroxysmal atrial  flutter ([place]) 10/07/2013   GERD 01/15/2011  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, status post graft repair the ascending aorta 10/24/2007   Hyperlipidemia LDL goal <100 05/23/2007   Anxiety 05/23/2007   Primary hypertension 05/23/2007    Immunization History  Administered Date(s) Administered   Fluad Quad(high Dose 65+) 11/12/2019, 09/02/2020   Influenza Split 09/13/2011   Influenza Whole 09/19/2008, 09/09/2009, 10/15/2010   Influenza, High Dose Seasonal PF 10/14/2015   Influenza,inj,Quad PF,6+ Mos 10/11/2013, 08/23/2014   Influenza-Unspecified 09/10/2016   Moderna SARS-COV2 Booster Vaccination 04/30/2021   Moderna Sars-Covid-2 Vaccination 01/15/2020, 02/12/2020   PFIZER(Purple Top)SARS-COV-2 Vaccination 09/29/2020   Pneumococcal Conjugate-13 10/14/2015   Pneumococcal Polysaccharide-23 01/09/2013   Td 09/01/2006   Zoster, Live 12/09/2010    Conditions to be addressed/monitored: CHF, HTN, HLD, DMII, CKD Stage 3, Anxiety, and IBS  Care Plan : Diabetes Type 2 (Adult)  Updates made by [name], PHARMD since 08/11/2021 12:00 AM     Problem: Long term care plan for management of Diabetes in a patient with cardiovascular disease   Priority: Medium     Long-Range Goal: Disease progression of Diabetes maintained or minimized.   Start Date: 05/22/2021  Expected End Date: 11/21/2021  Recent Progress: On track  Priority: Medium  Note:   Objective:  Lab Results  Component Value Date   HGBA1C 7.1 (H) 04/30/2021   Lab Results  Component Value Date   CREATININE 1.38 (H) 04/30/2021   CREATININE 1.16 (H) 03/02/2021   CREATININE 1.14 (H) 09/02/2020   Lab Results  Component Value Date   EGFR 49 (L) 03/02/2021  Current Barriers:  Knowledge Deficits related to long term management of Diabetes in a patient with history of cardiovascular disease(HTN, CHF, a-fib, HLD). Client reports feeling, "really, really good". Reports blood  sugars have been good. Today Blood sugar as 119 and 1:48 around 1:30 pm. Denies any low blood sugars. states blood pressures have been within normal range for her. She has another planned trip to the beach middle of August. Case Manager Clinical Goal(s):  patient will demonstrate improved adherence to prescribed treatment plan for diabetes self care/management as evidenced by: daily monitoring and recording of CBG  adherence to ADA/ carb modified diet adherence to prescribed medication regimen contacting provider for new or worsened symptoms or questions Interventions:  Collaboration with Carollee Herter, Alferd Apa, DO regarding development and update of comprehensive plan of care as evidenced by provider attestation and co-signature Inter-disciplinary care team collaboration (see longitudinal plan of care) Reviewed medications with patient Discussed blood sugar readings with client.  Discussed next appointment with primary care due around 10/2021. Client states she will wait and call to schedule this visit. Discussed plans with patient for ongoing care management follow up and provided patient with direct contact information for care management team Patient Goals: - continue to check blood sugars as recommended by your provider and contact for low blood sugars or consistently high blood sugar.  Follow the Rule blood sugars less than 60m/dl: STEP  1:  Take 15 grams of carbohydrates when your blood sugar is low, which includes:              3-4 glucose tabs or             3-4 oz of juice or regular soda or             One tube of glucose gel STEP 2:  Recheck blood sugar in 15 minutes STEP 3:  If your blood  sugar is still low at the 15 minute recheck ---then, go back to STEP 1 and treat again with another 15 grams of carbohydrates - continue to enter blood sugar readings and medication or insulin into daily log - continue to eat healthy: eat more grains, fruits and vegetables, lean meats and healthy  fats. - continue to be active and increase exercise per provider recommendation. - attend provider visits as scheduled. - continue to work with clinic pharmacist as scheduled re: medications and diabetes management Follow Up Plan: Telephone follow up appointment with care management team member scheduled for: next month The patient has been provided with contact information for the care management team and has been advised to call with any health related questions or concerns.      Care Plan : General Pharmacy (Adult)  Updates made by [name], PHARMD since 08/11/2021 12:00 AM     Problem: Type 2 DM; HDL, HTN; CHF; IBS; anxiety   Priority: High     Long-Range Goal: Pharmacy related goal for chronic conditions and medication management   Start Date: 06/05/2021  Priority: High  Note:   Current Barriers:  Unable to maintain control of type 2 DM Education needed regarding type 2 DM Education needed regarding medications and proper administration  Pharmacist Clinical Goal(s):  Over the next 90 days, patient will achieve adherence to monitoring guidelines and medication adherence to achieve therapeutic efficacy achieve control of type 2 DM as evidenced by A1c < 7.0 maintain control of HTN, CHF and hyperlipidemia  as evidenced by maintaining goals mentioned below  through collaboration with PharmD and provider.   Interventions: 1:1 collaboration with Carollee Herter, Alferd Apa, DO regarding development and update of comprehensive plan of care as evidenced by provider attestation and co-signature Inter-disciplinary care team collaboration (see longitudinal plan of care) Comprehensive medication review performed; medication list updated in electronic medical record  Diabetes / Obesity (A1c goal <7.0): Uncontrolled; Last A1c was 7.1% Current treatment: Jardiance 62m daily Ozempic 0.576mweekly (started 05/05/2021)  Patient mixed up directions and started Ozempic with 0.2m4meekly which  might explain the nausea and fatigue she felt the first 2 weeks. She reports nausea is improving.  Has lost about 5lbs since starting  Ozempic. Patient states cost of Ozempic was increased due to being in coverage gap - last was $240 / month Current glucose readings:  Range 110 to 160 Current meal patterns: Limiting calorie and carbohydrate intake. She drinks juice with 50% less sugar from time to time.  Current exercise: Walking in home about 3 times per day for about 10 to 20 minutes (total of 30 to 60 minutes per day)  Interventions:  Reviewed home blood glucose readings and reviewed goals  Fasting blood glucose goal (before meals) = 80 to 130 Blood glucose goal after a meal = less than 180  Continue Ozempic 0.2mg93mekly (will consider increasing to 1mg 72mfuture)  Sent patient application for Ozempic (HH=1; HH income = $50,400) Assessed for medication assistance program for Jardiance - patient does not qualify. Also would not qualify for Farxiga PAP.  Continue to limit sugar and carbohydrates (goal is 45 grams of carbohydrates per meal of less)  Continue to exercise with goal of at least 150 minutes per week.  Hypertension / CHF (HFmrEF) Controlled; Goal - BP <130/80 and to prevent CHF exacerbations and manage symptoms Current treatment: Carvedilol 22mg 52me a day Losartan 22mg -80me 0.5 tablet = 12.2mg dai28mFurosemide 20mg or 42m - ta12m  tablet daily depending on weight and swelling Potassium chloride 20 mEq - take 1 tablet daily  Jardiance 44m daily  Denies hypotensive/hypertensive symptoms Diastolic CHF EF = 459-93%(01/28/2021) NYHA class 1 Interventions:  Encouraged her to continue to check weight daily and report increases of more than 3lbs in 24 hours or 5lbs in 1 week Reviewed each medication above and benefits for heart health Continue current therapy for BP and heart  Hyperlipidemia: Controlled; LDL goal per cardiologist <100 current treatment: Rosuvastatin 269m daily  Omega 3 Fatty Acids - 10002maily Medications previously tried: none noted  Interventions:  Reviewed LDL goal Continue current therapy for lipids / heart health  Anxiety Controlled Current treatment: Citalopram 18m106md 40mg106mlet - takes 1 tablet of each to equal total daily dose of 60mg 41mrazolam 0.5mg up35m 3 times a day if needed for anxiety  Not currently connected with mental health support but patient has been stable for the last year.  Interventions:  Updated med list Recommended continue current therapy for anxiety  Patient Goals/Self-Care Activities Over the next 90 days, patient will:  take medications as prescribed,  check glucose 1 time a day, document, and provide at future appointments,  check blood pressure 1 to 2 time per week, document, and provide at future appointments,  weigh daily, and contact provider if weight gain of more than 3 lbs in 24 hours or 5 lbs in 1 week,  collaborate with provider on medication access solutions when needed target a minimum of 150 minutes of moderate intensity exercise weekly  Follow Up Plan:  (Juana Thea Silversmithmacy team will check in with patient again by phone in next 30 to 60 days. The care management team will reach out to the patient again over the next 30  days.      Medication Assistance: Application for Ozempic  medication assistance program. in process.  Anticipated assistance start date 08/28/2021.  See plan of care for additional detail. Assessed for MAP for Jardiance and Farxiga (patient [place] too high for [place] and [place] program or BI cares program.   Patient[facility] pharmacy [facility]:  [id number] [phone]-[place] [phone] Up:  Patient agrees to Care Plan and Follow-up.  Plan: The care management team will reach out to the patient again over the next 30 days.   Nigel Ericsson EcCherre [name] Clinical Pharmacist [place] [facility] [phone]

## 2021-08-14 ENCOUNTER — Ambulatory Visit: Payer: Medicare HMO

## 2021-08-14 DIAGNOSIS — E118 Type 2 diabetes mellitus with unspecified complications: Secondary | ICD-10-CM

## 2021-08-14 DIAGNOSIS — I10 Essential (primary) hypertension: Secondary | ICD-10-CM

## 2021-08-14 DIAGNOSIS — F419 Anxiety disorder, unspecified: Secondary | ICD-10-CM

## 2021-08-14 NOTE — Patient Instructions (Signed)
Visit Information  PATIENT GOALS:  Goals Addressed             This Visit's Progress    Monitor and Manage My Blood Sugar-Diabetes Type 2       Timeframe:  Long-Range Goal Priority:  High Start Date:  05/22/21                           Expected End Date:   11/21/21                    Follow Up Date 07/28/21   Patient Goals: - continue to check blood sugars as recommended by your provider and contact for low blood sugars or consistently high blood sugar.  - continue to enter blood sugar readings and medication or insulin into daily log - continue to eat healthy: eat more grains, fruits and vegetables, lean meats and healthy fats. - continue to be active and increase exercise per provider recommendation. - attend provider visits as scheduled. Follow the Rule blood sugars less than '70mg'$ /dl: STEP  1:  Take 15 grams of carbohydrates when your blood sugar is low, which includes:              3-4 glucose tabs or             3-4 oz of juice or regular soda or             One tube of glucose gel STEP 2:  Recheck blood sugar in 15 minutes STEP 3:  If your blood sugar is still low at the 15 minute recheck ---then, go back to STEP 1 and treat again with another 15 grams of carbohydrates        Patient verbalizes understanding of instructions provided today and agrees to view in [place].   Telephone follow up appointment with care management team member scheduled for: 09/14/21 The patient has been provided with contact information for the care management team and has been advised to call with any health related questions or concerns.   Thea Silversmith, RN, MSN, BSN, CCM Care Management Coordinator [facility] [phone]

## 2021-08-14 NOTE — Chronic Care Management (AMB) (Signed)
Chronic Care Management   CCM RN Visit Note  08/14/2021 Name: [name] MRN: [id number] DOB: 03/31/1946  Subjective: [name] is a 75 y.o. year old female who is a primary care patient of Anna Held, DO. The care management team was consulted for assistance with disease management and care coordination needs.    Engaged with patient by telephone for follow up visit in response to provider referral for case management and/or care coordination services.   Consent to Services:  The patient was given information about Chronic Care Management services, agreed to services, and gave verbal consent prior to initiation of services.  Please see initial visit note for detailed documentation.   Patient agreed to services and verbal consent obtained.   Assessment: Review of patient past medical history, allergies, medications, health status, including review of consultants reports, laboratory and other test data, was performed as part of comprehensive evaluation and provision of chronic care management services.   SDOH (Social Determinants of Health) assessments and interventions performed:    CCM Care Plan  Allergies  Allergen Reactions   Oxycodone Itching   Vicodin [Hydrocodone-Acetaminophen] Itching   Latex Itching and Rash   Neomycin-Bacitracin Zn-Polymyx Other (See Comments)    Red , swollen eye   Sulfonamide Derivatives Other (See Comments)    Just had reaction when taking during pregnancy- last child birth 50    Outpatient Encounter Medications as of 08/14/2021  Medication Sig Note   ALPRAZolam (XANAX) 0.5 MG tablet 1 po tid prn    Ascorbic Acid (VITAMIN C) 1000 MG tablet Take 1,000 mg by mouth daily.    carvedilol (COREG) 25 MG tablet TAKE 1 TABLET BY MOUTH TWICE A DAY WITH MEALS    citalopram (CELEXA) 20 MG tablet Take 20 mg by mouth daily. Along with 20m to equal 626mdaily 06/26/2021: Reports takes at night   citalopram (CELEXA) 40 MG tablet Take 40 mg by  mouth daily. Along with 2079mo equal 71m52mily 06/26/2021: Takes in the morning   [facility] ASPIRIN ADULT LOW DOSE 81 MG chewable tablet CHEW 1 TABLET BY MOUTH DAILY.    empagliflozin (JARDIANCE) 10 MG TABS tablet TAKE 1 TABLET(10 MG) BY MOUTH DAILY.    fluticasone (FLONASE) 50 MCG/ACT nasal spray USE 2 SPRAYS [facility] EVERY DAY 06/26/2021: Reports takes as needed   furosemide (LASIX) 20 MG tablet TAKE 1 TABLET BY MOUTH EVERY DAY    furosemide (LASIX) 40 MG tablet Take 0.5 tablets (20 mg total) by mouth daily. (Patient taking differently: Take 40 mg by mouth daily.)    hyoscyamine (LEVSIN SL) 0.125 MG SL tablet TAKE 1 TABLET BY MOUTH TWICE A DAY AS NEEDED FOR CRAMPING OR DIARRHEA OR LOOSE STOOLS    losartan (COZAAR) 25 MG tablet TAKE 1/2 TABLET BY MOUTH AT BEDTIME.    Omega-3 Fatty Acids (FISH OIL) 1000 MG CAPS Take 1,000 mg by mouth daily.     Potassium Chloride ER 20 MEQ TBCR TAKE 1 TABLET BY MOUTH EVERY DAY    Probiotic Product (PRO-BIOTIC BLEND PO) Take 1 tablet by mouth daily.     rosuvastatin (CRESTOR) 20 MG tablet Take 1 tablet (20 mg total) by mouth daily.    Semaglutide,0.25 or 0.5MG/DOS, (OZEMPIC, 0.25 OR 0.5 MG/DOSE,) 2 MG/1.5ML SOPN INJECT 0.25 MG WEEKLY FOR 4 WEEKS AND THEN INCREASE TO 0.5 MG THEREAFTER.    amoxicillin (AMOXIL) 500 MG tablet TAKE 4 TABLETS BY MOUTH 1 HOUR PRIOR TO DENTAL WORK, INCLUDING CLEANINGS  Blood Glucose Calibration (TRUE METRIX LEVEL 1) Low SOLN 1 each by In Vitro route in the morning and at bedtime.    Blood Glucose Monitoring Suppl (TRUE METRIX METER) DEVI 1 each by Does not apply route in the morning and at bedtime.    glucose blood (TRUE METRIX BLOOD GLUCOSE TEST) test strip Use as instructed    TRUEplus Lancets 33G MISC TEST BLOOD SUGAR TWICE DAILY IN THE MORNING AND AT BEDTIME    Facility-Administered Encounter Medications as of 08/14/2021  Medication   diclofenac sodium (VOLTAREN) 1 % transdermal gel 2 g    Patient Active Problem List    Diagnosis Date Noted   Preventative health care 04/30/2021   Hyperlipidemia associated with type 2 diabetes mellitus ([place]) 09/02/2020   Uncontrolled type 2 diabetes mellitus with hyperglycemia ([place]) 09/02/2020   Acute on chronic combined systolic and diastolic CHF (congestive heart failure) ([place]) 01/29/2020   S/P TAVR (transcatheter aortic valve replacement) 01/29/2020   Type 2 diabetes mellitus with complication (HCC)    Severe aortic stenosis    CKD (chronic kidney disease) stage 3, GFR 30-59 ml/min (HCC)    ICD (implantable cardioverter-defibrillator) in place 08/15/2019   Ventricular tachycardia ([place])    Anemia 06/27/2019   Severe obesity (BMI 35.0-35.9 with comorbidity) ([place]) 12/08/2018   DM (diabetes mellitus) type II uncontrolled, periph vascular disorder ([place]) 04/24/2015   Paroxysmal atrial flutter ([place]) 10/07/2013   GERD 01/15/2011   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   Start Date: 05/22/2021  Expected End Date: 11/21/2021  This Visit's Progress: On track  Recent Progress: On track  Priority: Medium  Note:   Objective:  Lab Results  Component Value Date   HGBA1C 7.1 (H) 04/30/2021   Lab Results  Component Value Date   CREATININE 1.38 (H) 04/30/2021   CREATININE 1.16 (H) 03/02/2021   CREATININE 1.14 (H) 09/02/2020   Lab Results  Component Value Date   EGFR 49 (L) 03/02/2021  Current Barriers:   Knowledge Deficits related to long term management of Diabetes in a patient with history of cardiovascular disease(HTN, CHF, a-fib, HLD). Client reports doing, "really good". Reports weight gain of around 30 pounds over the last year and is in the process of loosing weight. She states she has lost about 8 pounds since August. Blood sugar today 141. Denies any low blood sugars. Occasionally checks blood pressure. Patient discussed history of anxiety and reports doing well with managing anxiety. Patient expresses that she is in the "donut hole" and is not sure that she wants to apply for medication assistance programs Case Manager Clinical Goal(s):  patient will demonstrate improved adherence to prescribed treatment plan for diabetes self care/management as evidenced by: daily monitoring and recording of CBG  adherence to ADA/ carb modified diet adherence to prescribed medication regimen contacting provider for new or worsened symptoms or questions Interventions:  Collaboration with Carollee Herter, Alferd Apa, DO regarding  development and update of comprehensive plan of care as evidenced by provider attestation and co-signature Inter-disciplinary care team collaboration (see longitudinal plan of care) Medications reviewed with patient. Encouraged to continue to work with clinical pharmacist. Discussed meal planning Discussed signs/symptoms of heart failure exacerbation. Patient denies any signs/symptoms. Care coordination with clinical pharmacist regarding medication assistance. Encouraged to exercise per provider recommendation. Patient states she is planning to begin to walk more since it is getting cooler. Discussed plans with patient for ongoing care management follow up and provided patient with direct contact information for care management team Patient Goals: - continue to check blood sugars as recommended by your provider and contact for low blood sugars or consistently high blood sugar.  - continue  to enter blood sugar readings and medication or insulin into daily log - continue to eat healthy: eat more grains, fruits and vegetables, lean meats and healthy fats. - continue to be active and increase exercise per provider recommendation. - attend provider visits as scheduled. Follow the Rule blood sugars less than 74m/dl: STEP  1:  Take 15 grams of carbohydrates when your blood sugar is low, which includes:              3-4 glucose tabs or             3-4 oz of juice or regular soda or             One tube of glucose gel STEP 2:  Recheck blood sugar in 15 minutes STEP 3:  If your blood sugar is still low at the 15 minute recheck ---then, go back to STEP 1 and treat again with another 15 grams of carbohydrates Follow Up Plan: Telephone follow up appointment with care management team member scheduled for: next month The patient has been provided with contact information for the care management team and has been advised to call with any health related questions or concerns.     Plan:Telephone follow up appointment with care management team member scheduled for:  next month and The patient has been provided with contact information for the care management team and has been advised to call with any health related questions or concerns.   JThea Silversmith RN, MSN, BSN, CCM Care Management Coordinator [phone]

## 2021-08-16 ENCOUNTER — Other Ambulatory Visit: Payer: Self-pay | Admitting: Family Medicine

## 2021-08-16 DIAGNOSIS — F419 Anxiety disorder, unspecified: Secondary | ICD-10-CM

## 2021-08-16 DIAGNOSIS — E1165 Type 2 diabetes mellitus with hyperglycemia: Secondary | ICD-10-CM

## 2021-08-17 MED ORDER — ALPRAZOLAM 0.5 MG PO TABS: ORAL_TABLET | ORAL | 0 refills | Status: DC

## 2021-08-17 NOTE — Telephone Encounter (Signed)
Requesting: alprazolam 0.'5mg'$   Contract:09/02/2020 UDS:09/02/2020 Last Visit: 04/30/2021 Next Visit: None Last Refill: 07/20/2021 #90 and 0RF Pt sig: 1 tab tid prn  Please Advise

## 2021-08-21 ENCOUNTER — Other Ambulatory Visit: Payer: Self-pay | Admitting: Family Medicine

## 2021-08-21 DIAGNOSIS — E1169 Type 2 diabetes mellitus with other specified complication: Secondary | ICD-10-CM

## 2021-08-24 ENCOUNTER — Ambulatory Visit (INDEPENDENT_AMBULATORY_CARE_PROVIDER_SITE_OTHER): Payer: Medicare HMO

## 2021-08-24 DIAGNOSIS — I495 Sick sinus syndrome: Secondary | ICD-10-CM

## 2021-08-24 LAB — CUP PACEART REMOTE DEVICE CHECK
Battery Remaining Longevity: 74 mo
Battery Voltage: 2.99 V
Brady Statistic AP VP Percent: 99.89 %
Brady Statistic AP VS Percent: 0 %
Brady Statistic AS VP Percent: 0.11 %
Brady Statistic AS VS Percent: 0 %
Brady Statistic RA Percent Paced: 99.89 %
Brady Statistic RV Percent Paced: 99.98 %
Date Time Interrogation Session: 20220926022724
HighPow Impedance: 81 Ohm
Implantable Lead Implant Date: 20071016
Implantable Lead Implant Date: 20071016
Implantable Lead Implant Date: 20200730
Implantable Lead Location: 753859
Implantable Lead Location: 753860
Implantable Lead Location: 753860
Implantable Lead Model: 4092
Implantable Lead Model: 5594
Implantable Lead Model: 6935
Implantable Pulse Generator Implant Date: 20200730
Lead Channel Impedance Value: 399 Ohm
Lead Channel Impedance Value: 456 Ohm
Lead Channel Impedance Value: 513 Ohm
Lead Channel Pacing Threshold Amplitude: 0.625 V
Lead Channel Pacing Threshold Amplitude: 0.625 V
Lead Channel Pacing Threshold Pulse Width: 0.4 ms
Lead Channel Pacing Threshold Pulse Width: 0.4 ms
Lead Channel Sensing Intrinsic Amplitude: 26.375 mV
Lead Channel Sensing Intrinsic Amplitude: 26.375 mV
Lead Channel Sensing Intrinsic Amplitude: 3.5 mV
Lead Channel Sensing Intrinsic Amplitude: 3.5 mV
Lead Channel Setting Pacing Amplitude: 1.5 V
Lead Channel Setting Pacing Amplitude: 2.5 V
Lead Channel Setting Pacing Pulse Width: 0.4 ms
Lead Channel Setting Sensing Sensitivity: 0.3 mV

## 2021-08-25 ENCOUNTER — Ambulatory Visit: Payer: Medicare HMO | Admitting: Pharmacist

## 2021-08-25 DIAGNOSIS — F419 Anxiety disorder, unspecified: Secondary | ICD-10-CM

## 2021-08-25 DIAGNOSIS — I10 Essential (primary) hypertension: Secondary | ICD-10-CM

## 2021-08-25 DIAGNOSIS — E118 Type 2 diabetes mellitus with unspecified complications: Secondary | ICD-10-CM

## 2021-08-25 DIAGNOSIS — E785 Hyperlipidemia, unspecified: Secondary | ICD-10-CM

## 2021-08-28 DIAGNOSIS — E785 Hyperlipidemia, unspecified: Secondary | ICD-10-CM

## 2021-08-28 DIAGNOSIS — E118 Type 2 diabetes mellitus with unspecified complications: Secondary | ICD-10-CM | POA: Diagnosis not present

## 2021-08-28 DIAGNOSIS — I10 Essential (primary) hypertension: Secondary | ICD-10-CM | POA: Diagnosis not present

## 2021-08-28 NOTE — Chronic Care Management (AMB) (Signed)
Chronic Care Management Pharmacy Note  08/28/2021 Name:  [name] MRN:  [id number] DOB:  23-Feb-1946  Summary: BG and weight improving with addition of Ozempic. Currently taking Ozempic 0.82m weekly. Patient has reached Medicare coverage gap and cost of Ozempic was $240/30 days recently but she has declined to apply for patient assistance  Recommendations/Changes made from today's visit: Recommended she continue Ozempic at current dose of 0.583mweekly.    Subjective: [name] [age] year old female who is a primary patient of LoAnn HeldDO.  The CCM team was consulted for assistance with disease management and care coordination needs.    Engaged with patient by telephone for follow up visit in response to provider referral for pharmacy case management and/or care coordination services.   Consent to Services:  The patient was given information about Chronic Care Management services, agreed to services, and gave verbal consent prior to initiation of services.  Please see initial visit note for detailed documentation.   Patient Care Team: [name] ApaDO as PCP - General (Family Medicine) Croitoru, MiDani GobbleMD as PCP - Cardiology (Cardiology) WaLuretha RuedRN as Case Manager BaCyndia BentBrFernande BoydenMD as Consulting Physician (Cardiothoracic Surgery) TaRoyston CowperDDS (Dentistry) TaMarygrace DroughtMD as Consulting Physician (Ophthalmology) EcCherre RobinsPharmD (Pharmacist)  Recent office visits: 04/30/2021 - PCP (Dr LoEtter SjogrenCPE. restarted hyoscamine as needed for abdominal pain  Recent consult visits: 03/02/2021 - Cardio (Dr CrSallyanne KusterF/U CHF, HTN, TVAR, dysrythmias. No med changes.  [place]20[place] S/p TV[place]. No med changes.   Hospital visits: None in previous 6 months  Objective:  Lab Results  Component Value Date   CREATININE 1.38 (H) 04/30/2021   CREATININE 1.16 (H) 03/02/2021   CREATININE 1.14 (H) 09/02/2020     Lab Results  Component Value Date   HGBA1C 7.1 (H) 04/30/2021   Last diabetic Eye exam:  Lab Results  Component Value Date/Time   HMDIABEYEEXA No Retinopathy 10/08/2015 12:00 AM    Last diabetic Foot exam: No results found for: HMDIABFOOTEX      Component Value Date/Time   CHOL 170 04/30/2021 1032   TRIG 165.0 (H) 04/30/2021 1032   HDL 53.90 04/30/2021 1032   CHOLHDL 3 04/30/2021 1032   VLDL 33.0 04/30/2021 1032   LDLCALC 83 04/30/2021 1032   LDLCALC 65 09/02/2020 1134   LDLDIRECT 217.7 09/13/2011 1535    Hepatic Function Latest Ref Rng & Units 04/30/2021 09/02/2020 01/25/2020  Total Protein 6.0 - 8.3 g/dL 6.9 7.4 7.5  Albumin 3.5 - 5.2 g/dL 4.2 - 3.2(L)  AST 0 - 37 U/L '14 17 23  ' ALT 0 - 35 U/L '11 15 15  ' Alk Phosphatase 39 - 117 U/L 68 - 85  Total Bilirubin 0.2 - 1.2 mg/dL 0.4 0.3 0.6  Bilirubin, Direct 0.0 - 0.3 mg/dL - - -    Lab Results  Component Value Date/Time   TSH 0.893 06/28/2019 02:13 AM   TSH 0.986 08/22/2013 04:10 PM   TSH 0.96 12/09/2010 10:31 AM    CBC Latest Ref Rng & Units 04/30/2021 02/01/2020 01/31/2020  WBC 4.0 - 10.5 K/uL 5.1 4.0 4.2  Hemoglobin 12.0 - 15.0 g/dL 11.9(L) 9.6(L) 10.2(L)  Hematocrit 36.0 - 46.0 % 35.2(L) 29.5(L) 31.2(L)  Platelets 150.0 - 400.0 K/uL 120.0(L) 92(L) 88(L)    Lab Results  Component Value Date/Time   VD25OH 13 (L) 12/09/2010 08:16 PM    Clinical ASCVD: No  The 10-year ASCVD risk score (Arnett DK, et al., 2019) is: 32.1%   Values used to calculate the score:     Age: 75 years     Sex: Female     Is Non-Hispanic African American: No     Diabetic: Yes     Tobacco smoker: No     Systolic Blood Pressure: 671 mmHg     Is BP treated: Yes     HDL Cholesterol: 53.9 mg/dL     Total Cholesterol: 170 mg/dL      Social History   Tobacco Use  Smoking Status Former   Types: Cigarettes   Quit date: 09/04/1993   Years since quitting: 28.0  Smokeless Tobacco Never   BP Readings from Last 3 Encounters:  04/30/21  126/84  03/02/21 130/78  01/28/21 120/80   Pulse Readings from Last 3 Encounters:  04/30/21 80  03/02/21 64  01/28/21 75   Wt Readings from Last 3 Encounters:  04/30/21 189 lb 12.8 oz (86.1 kg)  03/02/21 185 lb (83.9 kg)  01/28/21 184 lb 12.8 oz (83.8 kg)    Assessment: Review of patient past medical history, allergies, medications, health status, including review of consultants reports, laboratory and other test data, was performed as part of comprehensive evaluation and provision of chronic care management services.   SDOH:  (Social Determinants of Health) assessments and interventions performed:     CCM Care Plan  Allergies  Allergen Reactions   Oxycodone Itching   Vicodin [Hydrocodone-Acetaminophen] Itching   Latex Itching and Rash   Neomycin-Bacitracin Zn-Polymyx Other (See Comments)    Red , swollen eye   Sulfonamide Derivatives Other (See Comments)    Just had reaction when taking during pregnancy- last child birth 29    Medications Reviewed Today     Reviewed by [name], PharmD (Pharmacist) on 08/28/21 at [place] List Status: <None>   Medication Order Taking? Sig Documenting Provider Last Dose Status Informant  ALPRAZolam (XANAX) 0.5 MG tablet [id number] Yes 1 po tid prn Carollee Herter, Yvonne R, DO Taking Active   amoxicillin (AMOXIL) 500 MG tablet [id number] Yes TAKE 4 TABLETS BY MOUTH 1 HOUR PRIOR TO DENTAL WORK, INCLUDING CLEANINGS Sherren Mocha, MD Taking Active   Ascorbic Acid (VITAMIN C) 1000 MG tablet [id number] Yes Take 1,000 mg by mouth daily. [provider] Taking Active Self  Blood Glucose Calibration (TRUE METRIX LEVEL 1) Low SOLN [id number] Yes 1 each by In Vitro route in the morning and at bedtime. [name], DO Taking Active   Blood Glucose Monitoring Suppl (TRUE METRIX METER) DEVI [id number] Yes 1 each by Does not apply route in the morning and at bedtime. Roma Schanz R, DO Taking Active   carvedilol (COREG) 25 MG  tablet [id number] Yes TAKE 1 TABLET BY MOUTH TWICE A DAY WITH MEALS Croitoru, Mihai, MD Taking Active   citalopram (CELEXA) 20 MG tablet [id number] Yes Take 20 mg by mouth daily. Along with 47m to equal 618mdaily [provider] Taking Active            Med Note (WJuleen ChinaJUDeno Etienne Fri Jun 26, 2021  3:52 PM) Reports takes at night  citalopram (CELEXA) 40 MG tablet [id number] Take 40 mg by mouth daily. Along with 2034mo equal 22m44mily [provider] Taking Active            Med Note (WALJuleen ChinaANDeno Etienneri Jun 26, 2021  3:51 PM)  Takes in the morning  [facility] ASPIRIN ADULT LOW DOSE 81 MG chewable tablet [id number] Yes CHEW 1 TABLET BY MOUTH DAILY. Croitoru, Mihai, MD Taking Active   diclofenac sodium (VOLTAREN) 1 % transdermal gel 2 g [id number]   Carollee Herter, Alferd Apa, DO  Active   empagliflozin (JARDIANCE) 10 MG TABS tablet [id number] Yes TAKE 1 TABLET(10 MG) BY MOUTH DAILY. Croitoru, Mihai, MD Taking Active   fluticasone [facility]) 50 MCG/ACT nasal spray [id number] Yes USE 2 SPRAYS IN Operating Room Services EVERY DAY [name], DO Taking Active Self           Med Note Juleen China, Deno Etienne   Fri Jun 26, 2021  3:52 PM) Reports takes as needed  furosemide (LASIX) 20 MG tablet [id number] Yes TAKE 1 TABLET BY MOUTH EVERY DAY Croitoru, Mihai, MD Taking Active   furosemide (LASIX) 40 MG tablet [id number] Yes Take 0.5 tablets (20 mg total) by mouth daily.  Patient taking differently: Take 40 mg by mouth daily.   Roma Schanz R, DO Taking Active   glucose blood (TRUE METRIX BLOOD GLUCOSE TEST) test strip [id number] Yes Use as instructed Carollee Herter, Alferd Apa, DO Taking Active   hyoscyamine (LEVSIN SL) 0.125 MG SL tablet [id number] Yes TAKE 1 TABLET BY MOUTH TWICE A DAY AS NEEDED FOR CRAMPING OR DIARRHEA OR LOOSE STOOLS Carollee Herter, Yvonne R, DO Taking Active   losartan (COZAAR) 25 MG tablet [id number] Yes TAKE 1/2 TABLET BY MOUTH AT BEDTIME. Croitoru, Mihai, MD Taking Active   Omega-3  Fatty Acids (FISH OIL) 1000 MG CAPS [id number] Yes Take 1,000 mg by mouth daily.  [provider] Taking Active Self  Potassium Chloride ER 20 MEQ TBCR [id number] Yes TAKE 1 TABLET BY MOUTH EVERY DAY Croitoru, Mihai, MD Taking Active   Probiotic Product (PRO-BIOTIC BLEND PO) [id number] Yes Take 1 tablet by mouth daily.  [provider] Taking Active Self  rosuvastatin (CRESTOR) 20 MG tablet [id number] Yes TAKE 1 TABLET BY MOUTH EVERY DAY Carollee Herter, Alferd Apa, DO Taking Active   Semaglutide,0.25 or 0.5MG/DOS, (OZEMPIC, 0.25 OR 0.5 MG/DOSE,) 2 MG/1.5ML SOPN [id number] Yes Inject 0.5 mg into the skin once a week. [name], DO Taking Active   TRUEplus Lancets 33G MISC [id number] Yes TEST BLOOD SUGAR TWICE DAILY IN THE MORNING AND AT BEDTIME [name], DO Taking Active             Patient Active Problem List   Diagnosis Date Noted   Preventative health care 04/30/2021   Hyperlipidemia associated with type 2 diabetes mellitus ([place]) 09/02/2020   Uncontrolled type 2 diabetes mellitus with hyperglycemia ([place]) 09/02/2020   Acute on chronic combined systolic and diastolic CHF (congestive heart failure) ([place]) 01/29/2020   S/P TAVR (transcatheter aortic valve replacement) 01/29/2020   Type 2 diabetes mellitus with complication (HCC)    Severe aortic stenosis    CKD (chronic kidney disease) stage 3, GFR 30-59 ml/min (HCC)    ICD (implantable cardioverter-defibrillator) in place 08/15/2019   Ventricular tachycardia ([place])    Anemia 06/27/2019   Severe obesity (BMI 35.0-35.9 with comorbidity) ([place]) 12/08/2018   DM (diabetes mellitus) type II uncontrolled, periph vascular disorder ([place]) 04/24/2015   Paroxysmal atrial flutter ([place]) 10/07/2013   GERD 01/15/2011   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, status post graft repair the ascending aorta 10/24/2007   Hyperlipidemia LDL goal <100 05/23/2007   Anxiety 05/23/2007  Primary hypertension 05/23/2007    Immunization History  Administered Date(s) Administered   Fluad Quad(high Dose 65+) 11/12/2019, 09/02/2020   Influenza Split 09/13/2011   Influenza Whole 09/19/2008, 09/09/2009, 10/15/2010   Influenza, High Dose Seasonal PF 10/14/2015   Influenza,inj,Quad PF,6+ Mos 10/11/2013, 08/23/2014   Influenza-Unspecified 09/10/2016   Moderna SARS-COV2 Booster Vaccination 04/30/2021   Moderna Sars-Covid-2 Vaccination 01/15/2020, 02/12/2020   PFIZER(Purple Top)SARS-COV-2 Vaccination 09/29/2020   Pneumococcal Conjugate-13 10/14/2015   Pneumococcal Polysaccharide-23 01/09/2013   Td 09/01/2006   Zoster, Live 12/09/2010    Conditions to be addressed/monitored: CHF, HTN, HLD, DMII, CKD Stage 3, Anxiety, and IBS  Care Plan : General Pharmacy (Adult)  Updates made by [name], PHARMD since 08/28/2021 12:00 AM     Problem: Type 2 DM; HDL, HTN; CHF; IBS; anxiety   Priority: High     Long-Range Goal: Pharmacy related goal for chronic conditions and medication management   Start Date: 06/05/2021  Priority: High  Note:   Current Barriers:  Unable to maintain control of type 2 DM Education needed regarding type 2 DM Education needed regarding medications and proper administration  Pharmacist Clinical Goal(s):  Over the next 90 days, patient will achieve adherence to monitoring guidelines and medication adherence to achieve therapeutic efficacy achieve control of type 2 DM as evidenced by A1c < 7.0 maintain control of HTN, CHF and hyperlipidemia  as evidenced by maintaining goals mentioned below  through collaboration with PharmD and provider.   Interventions: 1:1 collaboration with Carollee Herter, Alferd Apa, DO regarding development and update of comprehensive plan of care as evidenced by provider attestation and co-signature Inter-disciplinary care team collaboration (see longitudinal plan of care) Comprehensive medication review performed; medication list  updated in electronic medical record  Diabetes / Obesity (A1c goal <7.0): Uncontrolled; Last A1c was 7.1% Current treatment: Jardiance 654m daily Ozempic 0.554mweekly (started 05/05/2021)   Has lost about 5lbs since starting  Ozempic. She a little disappointed that weight loss has stalled.  Patient states cost of Ozempic was increased due to being in coverage gap - last was $240 / month. Send application for patient assistance for 2022 but patient decided she does not want to apply Current glucose readings:  Range 110 to 180 Current meal patterns: Limiting calorie and carbohydrate intake. She drinks juice with 50% less sugar from time to time.  Current exercise: Walking in home about 3 times per day for about 10 to 20 minutes (total of 30 to 60 minutes per day)  Interventions:  Reviewed home blood glucose readings and reviewed goals  Fasting blood glucose goal (before meals) = 80 to 130 Blood glucose goal after a meal = less than 180  Continue Ozempic 0.54m70meekly. Discussed increasing to 1mg29mekly to see if weight loss improves but patient does not want to increase dose yet.  Assessed for medication assistance program for Jardiance - patient does not qualify. Also would not qualify for Farxiga PAP.  Continue to limit sugar and carbohydrates (goal is 45 grams of carbohydrates per meal of less)  Continue to exercise with goal of at least 150 minutes per week.  Hypertension / CHF (HFmrEF) Controlled; Goal - BP <130/80 and to prevent CHF exacerbations and manage symptoms Current treatment: Carvedilol 254mg83mce a day Losartan 254mg 48mke 0.5 tablet = 12.54mg da84m Furosemide 20mg or69mg - t78m1 tablet daily depending on weight and swelling Potassium chloride 20 mEq - take 1 tablet daily  Jardiance 10mg dail20menies hypotensive/hypertensive symptoms  Diastolic CHF EF = 19-41% (01/28/2021) NYHA class 1 Interventions:  Encouraged her to continue to check weight daily and report increases  of more than 3lbs in 24 hours or 5lbs in 1 week Reviewed each medication above and benefits for heart health Continue current therapy for BP and heart  Hyperlipidemia: Controlled; LDL goal per cardiologist <100 Current treatment: Rosuvastatin 15m daily  Omega 3 Fatty Acids - 10075mdaily Medications previously tried: none noted  Per refill history last rosuvastatin fill was 05/26/2021 for 90 day supply Interventions:  Reviewed LDL goal Continue current therapy for lipids / heart health Discussed rosuvastatin adherence as refill records show that she is past due to refill again. Patient endorses that she is taking rosuvastatin every day but she has a surplus of rosuvastatin currently.   Anxiety Controlled Current treatment: Citalopram 2024mnd 35m60mblet - takes 1 tablet of each to equal total daily dose of 60mg45mprazolam 0.5mg u54mo 3 times a day if needed for anxiety  Not currently connected with mental health support but patient has been stable for the last year.  Interventions:  Recommended continue current therapy for anxiety  Patient Goals/Self-Care Activities Over the next 90 days, patient will:  take medications as prescribed,  check glucose 1 time a day, document, and provide at future appointments,  check blood pressure 1 to 2 time per week, document, and provide at future appointments,  weigh daily, and contact provider if weight gain of more than 3 lbs in 24 hours or 5 lbs in 1 week,  collaborate with provider on medication access solutions when needed target a minimum of 150 minutes of moderate intensity exercise weekly  Follow Up Plan:  (JuanaThea Silversmithrmacy team will check in with patient again by phone in next 60 days. The care management team will reach out to the patient again over the next 30  days.      Medication Assistance: Application for Ozempic  medication assistance program mailed to patient but she has decided not to apply for medication  assistance. Assessed for MAP for Jardiance and Farxiga (patient income too high for [place] and [place] program or BI cares program.   [facility]  [id number] [id number] [phone] Up:  Patient agrees to Care Plan and Follow-up.  Plan: The care management team will reach out to the patient again over the next 60 days.   Anyiah Coverdale ECherre RobinsD Clinical Pharmacist [phone]

## 2021-08-28 NOTE — Patient Instructions (Signed)
Visit Information  PATIENT GOALS:  Goals Addressed             This Visit's Progress    Pharmacy and Medication Related Goals   On track    Current Barriers:  Unable to maintain control of type 2 DM Education needed regarding type 2 DM Education needed regarding medications and proper administration  Pharmacist Clinical Goal(s):  Over the next 90 days, patient will achieve adherence to monitoring guidelines and medication adherence to achieve therapeutic efficacy achieve control of diabetes as evidenced by A1c < 7.0 maintain control of blood pressure / heart disease and hyperlipidemia  as evidenced by maintaining goals mentioned below  through collaboration with PharmD and provider.   Interventions: 1:1 collaboration with Carollee Herter, Alferd Apa, DO regarding development and update of comprehensive plan of care as evidenced by provider attestation and co-signature Inter-disciplinary care team collaboration (see longitudinal plan of care) Comprehensive medication review performed; medication list updated in electronic medical record  Diabetes / Weight Management (A1c goal <7.0): Last A1c was 7.1% Current treatment: Jardiance 10mg  daily Ozempic 0.25mg  weekly for 4 weeks, then 0.5mg  thereafter (started 05/05/2021)  Interventions:  Reviewed home blood glucose readings and reviewed goals  Fasting blood glucose goal (before meals) = 80 to 130 Blood glucose goal after a meal = less than 180  Continue to limit sugar and carbohydrates (goal is 45 grams of carbohydrates per meal of less)  Continue to exercise with goal of at least 150 minutes per week.  Hypertension / Heart Controlled; Goal - BP <130/80 and to prevent CHF exacerbations and manage symptoms Current treatment: Carvedilol 25mg  twice a day Losartan 25mg  - take 0.5 tablet = 12.5mg  daily Furosemide 20mg  or 40mg  - take 1 tablet daily depending on weight and swelling Potassium chloride 20 mEq - take 1 tablet daily  Jardiance  10mg  daily  Interventions:  Continue to check weight daily and report increases of more than 3lbs in 24 hours or 5lbs in 1 week Reviewed each medication above and benefits for heart health Continue to limit sodium intake to <2300 mg per day Continue current therapy for blood pressure and heart  Hyperlipidemia: Controlled; LDL goal  <100 (last LDL was 83) current treatment: Rosuvastatin 20mg  daily  Omega 3 Fatty Acids - 1000mg  daily Interventions:  Reviewed LDL goal Continue current therapy for lipids / heart health  Anxiety Current treatment: Citalopram 20mg  and 40mg  tablet - take 1 tablet of each to equal total daily dose of 60mg   Alprazolam 0.5mg  up to 3 times a day if needed for anxiety  Interventions:  Recommend continue current therapy for anxiety  Patient Goals/Self-Care Activities Over the next 90 days, patient will:  take medications as prescribed,  check glucose 1 time a day, document, and provide at future appointments,  check blood pressure 1 to 2 time per week, document, and provide at future appointments,  weigh daily, and contact provider if weight gain of more than 3 lbs in 24 hours or 5 lbs in 1 week,  collaborate with provider on medication access solutions when needed target a minimum of 150 minutes of moderate intensity exercise weekly  Follow Up Plan:   Pharmacy team will check in with patient again by phone in next 60 days. The care management team will reach out to the patient again over the next 30  days        Patient verbalizes understanding of instructions provided today and agrees to view in [place].   Telephone follow  up appointment with care management team member scheduled for: 71 day  [name], PharmD Clinical Pharmacist [facility]

## 2021-08-31 ENCOUNTER — Telehealth: Payer: Self-pay | Admitting: Cardiovascular Disease

## 2021-08-31 DIAGNOSIS — I10 Essential (primary) hypertension: Secondary | ICD-10-CM

## 2021-08-31 NOTE — Telephone Encounter (Signed)
*  STAT* If patient is at the pharmacy, call can be transferred to refill team.   1. Which medications need to be refilled? (please list name of each medication and dose if known) new prescription for Furosemide 20 mg for insurance purpose  2. Which pharmacy/location (including street and city if local pharmacy) is medication to be sent to? [facility] [address] [place]  3. Do they need a 30 day or 90 day supply? 90 days and refills

## 2021-08-31 NOTE — Progress Notes (Signed)
Remote ICD transmission.   

## 2021-09-01 MED ORDER — FUROSEMIDE 40 MG PO TABS: 40.0000 mg | ORAL_TABLET | Freq: Every day | ORAL | 2 refills | Status: DC

## 2021-09-01 NOTE — Telephone Encounter (Signed)
Refills has been sent to the pharmacy. 

## 2021-09-02 ENCOUNTER — Other Ambulatory Visit: Payer: Self-pay | Admitting: Cardiovascular Disease

## 2021-09-10 ENCOUNTER — Other Ambulatory Visit: Payer: Self-pay | Admitting: Family Medicine

## 2021-09-15 ENCOUNTER — Other Ambulatory Visit: Payer: Self-pay | Admitting: Family Medicine

## 2021-09-15 DIAGNOSIS — F419 Anxiety disorder, unspecified: Secondary | ICD-10-CM

## 2021-09-15 MED ORDER — ALPRAZOLAM 0.5 MG PO TABS: ORAL_TABLET | ORAL | 0 refills | Status: DC

## 2021-09-15 NOTE — Telephone Encounter (Signed)
Requesting: alprazolam 0.5mg   Contract:09/02/2020 UDS: 09/02/2020 Last Visit: 04/30/2021 Next Visit: 10/20/2021 Last Refill: 08/17/2021 #90 and 0RF  Please Advise

## 2021-09-16 ENCOUNTER — Telehealth: Payer: Self-pay | Admitting: Family Medicine

## 2021-09-21 ENCOUNTER — Ambulatory Visit: Payer: Medicare HMO | Attending: Internal Medicine

## 2021-09-21 ENCOUNTER — Other Ambulatory Visit (HOSPITAL_BASED_OUTPATIENT_CLINIC_OR_DEPARTMENT_OTHER): Payer: Self-pay

## 2021-09-21 DIAGNOSIS — Z23 Encounter for immunization: Secondary | ICD-10-CM

## 2021-09-21 MED ORDER — INFLUENZA VAC A&B SA ADJ QUAD 0.5 ML IM PRSY
PREFILLED_SYRINGE | INTRAMUSCULAR | 0 refills | Status: DC
  Filled 2021-09-21: qty 0.5, 1d supply, fill #0

## 2021-09-21 NOTE — Progress Notes (Signed)
   Covid-19 Vaccination Clinic  Name:  SAMANVI CUCCIA    MRN: [id number] DOB: 04/24/1946  09/21/2021  Ms. Geers was observed post Covid-19 immunization for 15 minutes without incident. She was provided with Vaccine Information Sheet and instruction to access the V-Safe system.   Ms. Rossel was instructed to call 911 with any severe reactions post vaccine: Difficulty breathing  Swelling of face and throat  A fast heartbeat  A bad rash all over body  Dizziness and weakness   Immunizations Administered     Name Date Dose VIS Date Route   Moderna Covid-19 vaccine Bivalent Booster 09/21/2021 12:17 PM 0.5 mL 07/11/2021 Intramuscular   Manufacturer: Moderna   Lot: [id number]   [place]: [id number]

## 2021-09-23 ENCOUNTER — Other Ambulatory Visit: Payer: Self-pay | Admitting: Cardiovascular Disease

## 2021-10-13 ENCOUNTER — Other Ambulatory Visit: Payer: Self-pay | Admitting: Family Medicine

## 2021-10-13 DIAGNOSIS — F419 Anxiety disorder, unspecified: Secondary | ICD-10-CM

## 2021-10-13 NOTE — Telephone Encounter (Signed)
Requesting: Alprazolam 0.5mg  Contract: 09/02/2020 UDS: 09/02/2020 Last Visit: 04/30/2021 Next Visit: None Last Refill: 09/15/2021, #90 x 0RF  Please Advise

## 2021-10-14 NOTE — Telephone Encounter (Signed)
Error

## 2021-10-16 ENCOUNTER — Other Ambulatory Visit: Payer: Self-pay | Admitting: Cardiovascular Disease

## 2021-10-16 ENCOUNTER — Other Ambulatory Visit (HOSPITAL_BASED_OUTPATIENT_CLINIC_OR_DEPARTMENT_OTHER): Payer: Self-pay

## 2021-10-16 MED ORDER — MODERNA COVID-19 BIVAL BOOSTER 50 MCG/0.5ML IM SUSP
INTRAMUSCULAR | 0 refills | Status: DC
  Filled 2021-10-16: qty 0.5, 1d supply, fill #0

## 2021-10-20 ENCOUNTER — Ambulatory Visit (INDEPENDENT_AMBULATORY_CARE_PROVIDER_SITE_OTHER): Payer: Medicare HMO | Admitting: Pharmacist

## 2021-10-20 DIAGNOSIS — I5043 Acute on chronic combined systolic (congestive) and diastolic (congestive) heart failure: Secondary | ICD-10-CM

## 2021-10-20 DIAGNOSIS — E118 Type 2 diabetes mellitus with unspecified complications: Secondary | ICD-10-CM

## 2021-10-20 DIAGNOSIS — I10 Essential (primary) hypertension: Secondary | ICD-10-CM

## 2021-10-20 DIAGNOSIS — E785 Hyperlipidemia, unspecified: Secondary | ICD-10-CM

## 2021-10-25 NOTE — Chronic Care Management (AMB) (Signed)
Chronic Care Management Pharmacy Note  10/25/2021 Name:  [name] MRN:  [id number] DOB:  09-08-1946  Summary: BG and weight continue to improve after addition of Ozempic. Currently taking Ozempic 0.22m weekly. Patient has reached Medicare coverage gap and cost of Ozempic was $240/30 days recently but she has declined to apply for patient assistance.  Home blood pressure has been at goal.   Recommendations/Changes made from today's visit: Recommended she continue Ozempic at current dose of 0.533mweekly.  Reminded to get yearly eye exam.  Continue current blood pressure medications  Subjective: Anna ELRODs an 89.o. year old female who is a primary patient of LoAnn HeldDO.  The CCM team was consulted for assistance with disease management and care coordination needs.    Engaged with patient by telephone for follow up visit in response to provider referral for pharmacy case management and/or care coordination services.   Consent to Services:  The patient was given information about Chronic Care Management services, agreed to services, and gave verbal consent prior to initiation of services.  Please see initial visit note for detailed documentation.   Patient Care Team: [name] ApaDO as PCP - General (Family Medicine) Croitoru, MiDani GobbleMD as PCP - Cardiology (Cardiology) WaLuretha RuedRN as Case Manager BaCyndia BentBrFernande BoydenMD as Consulting Physician (Cardiothoracic Surgery) TaRoyston CowperDDS (Dentistry) TaMarygrace DroughtMD as Consulting Physician (Ophthalmology) EcCherre RobinsRPH-CPP (Pharmacist)  Recent office visits: 04/30/2021 - PCP (Dr LoEtter SjogrenCPE. restarted hyoscamine as needed for abdominal pain  Recent consult visits: 03/02/2021 - Cardio (Dr CrSallyanne KusterF/U CHF, HTN, TVAR, dysrythmias. No med changes.  01/28/2021 - Cardio ([facility]AC) S/p TVAR. No med changes.   Hospital visits: None in previous 6 months  Objective:  Lab  Results  Component Value Date   CREATININE 1.38 (H) 04/30/2021   CREATININE 1.16 (H) 03/02/2021   CREATININE 1.14 (H) 09/02/2020    Lab Results  Component Value Date   HGBA1C 7.1 (H) 04/30/2021   Last diabetic Eye exam:  Lab Results  Component Value Date/Time   HMDIABEYEEXA No Retinopathy 10/08/2015 12:00 AM    Last diabetic Foot exam: No results found for: HMDIABFOOTEX      Component Value Date/Time   CHOL 170 04/30/2021 1032   TRIG 165.0 (H) 04/30/2021 1032   HDL 53.90 04/30/2021 1032   CHOLHDL 3 04/30/2021 1032   VLDL 33.0 04/30/2021 1032   LDLCALC 83 04/30/2021 1032   LDLCALC 65 09/02/2020 1134   LDLDIRECT 217.7 09/13/2011 1535    Hepatic Function Latest Ref Rng & Units 04/30/2021 09/02/2020 01/25/2020  Total Protein 6.0 - 8.3 g/dL 6.9 7.4 7.5  Albumin 3.5 - 5.2 g/dL 4.2 - 3.2(L)  AST 0 - 37 U/L _0 ALT 0 - 35 U/L _1 Alk Phosphatase 39 - 117 U/L 68 - 85  Total Bilirubin 0.2 - 1.2 mg/dL 0.4 0.3 0.6  Bilirubin, Direct 0.0 - 0.3 mg/dL - - -    Lab Results  Component Value Date/Time   TSH 0.893 06/28/2019 02:13 AM   TSH 0.986 08/22/2013 04:10 PM   TSH 0.96 12/09/2010 10:31 AM    CBC Latest Ref Rng & Units 04/30/2021 02/01/2020 01/31/2020  WBC 4.0 - 10.5 K/uL 5.1 4.0 4.2  Hemoglobin 12.0 - 15.0 g/dL 11.9(L) 9.6(L) 10.2(L)  Hematocrit 36.0 - 46.0 % 35.2(L) 29.5(L) 31.2(L)  Platelets 150.0 - 400.0 K/uL 120.0(L) 92(L) 88(L)  Lab Results  Component Value Date/Time   VD25OH 13 (L) 12/09/2010 08:16 PM    Clinical ASCVD: No  The 10-year ASCVD risk score (Arnett DK, et al., 2019) is: 35.4%   Values used to calculate the score:     Age: 3 years     Sex: Female     Is Non-Hispanic African American: No     Diabetic: Yes     Tobacco smoker: No     Systolic Blood Pressure: 638 mmHg     Is BP treated: Yes     HDL Cholesterol: 53.9 mg/dL     Total Cholesterol: 170 mg/dL      Social History   Tobacco Use  Smoking Status Former   Types: Cigarettes    Quit date: 09/04/1993   Years since quitting: 28.1  Smokeless Tobacco Never   BP Readings from Last 3 Encounters:  04/30/21 126/84  03/02/21 130/78  01/28/21 120/80   Pulse Readings from Last 3 Encounters:  04/30/21 80  03/02/21 64  01/28/21 75   Wt Readings from Last 3 Encounters:  04/30/21 189 lb 12.8 oz (86.1 kg)  03/02/21 185 lb (83.9 kg)  01/28/21 184 lb 12.8 oz (83.8 kg)    Assessment: Review of patient past medical history, allergies, medications, health status, including review of consultants reports, laboratory and other test data, was performed as part of comprehensive evaluation and provision of chronic care management services.   SDOH:  (Social Determinants of Health) assessments and interventions performed:  SDOH Interventions    Flowsheet Row Most Recent Value  SDOH Interventions   Housing Interventions Intervention Not Indicated  Physical Activity Interventions Other (Comments)  [Dicussed goal of 150 mintues per week of exercise]        CCM Care Plan  Allergies  Allergen Reactions   Oxycodone Itching   Vicodin [Hydrocodone-Acetaminophen] Itching   Latex Itching and Rash   Neomycin-Bacitracin Zn-Polymyx Other (See Comments)    Red , swollen eye   Sulfonamide Derivatives Other (See Comments)    Just had reaction when taking during pregnancy- last child birth 75    Medications Reviewed Today     Reviewed by [name], RPH-CPP (Pharmacist) on 10/20/21 at 1018  Med List Status: <None>   Medication Order Taking? Sig Documenting Provider Last Dose Status Informant  ALPRAZolam (XANAX) 0.5 MG tablet [id number] Yes TAKE 1 TABLET BY MOUTH THREE TIMES A DAY AS NEEDED Carollee Herter, Alferd Apa, DO Taking Active   amoxicillin (AMOXIL) 500 MG tablet [id number] Yes TAKE 4 TABLETS BY MOUTH 1 HOUR PRIOR TO DENTAL WORK, INCLUDING CLEANINGS Sherren Mocha, MD Taking Active   Ascorbic Acid (VITAMIN C) 1000 MG tablet [id number] Yes Take 1,000 mg by mouth daily. [provider] Taking Active Self  Blood Glucose Calibration (TRUE METRIX LEVEL 1) Low SOLN [id number] Yes 1 each by In Vitro route in the morning and at bedtime. [name], DO Taking Active   Blood Glucose Monitoring Suppl (TRUE METRIX METER) DEVI [id number] Yes 1 each by Does not apply route in the morning and at bedtime. Carollee Herter, Kendrick Fries R, DO Taking Active   carvedilol (COREG) 25 MG tablet [id number] Yes TAKE 1 TABLET BY MOUTH TWICE A DAY WITH MEALS Croitoru, Mihai, MD Taking Active   citalopram (CELEXA) 20 MG tablet [id number] Yes TAKE 1 TABLET EVERY DAY ALONG WITH 40MG TO EQUAL 60MG TOTAL Roma Schanz R, DO Taking Active   citalopram (CELEXA) 40 MG tablet [id number] Yes TAKE 1  TABLET EVERY DAY ALONG WITH 20MG TO EQUAL 60MG TOTAL Roma Schanz R, DO Taking Active   [facility] ASPIRIN ADULT LOW DOSE 81 MG chewable tablet [id number] Yes CHEW 1 TABLET BY MOUTH DAILY. Croitoru, Mihai, MD Taking Active   diclofenac sodium (VOLTAREN) 1 % transdermal gel 2 g [id number]   Carollee Herter, Alferd Apa, DO  Consider Medication Status and Discontinue (Change in therapy)   fluticasone (FLONASE) 50 MCG/ACT nasal spray [id number] Yes USE 2 SPRAYS IN [place] EVERY DAY [name], DO Taking Active Self           Med Note Juleen China, Deno Etienne   Fri Jun 26, 2021  3:52 PM) Reports takes as needed  furosemide (LASIX) 20 MG tablet [id number] Yes TAKE 1 TABLET BY MOUTH EVERY DAY Croitoru, Mihai, MD Taking Active   furosemide (LASIX) 40 MG tablet [id number] Yes Take 1 tablet (40 mg total) by mouth daily. Croitoru, Mihai, MD Taking Active   glucose blood (TRUE METRIX BLOOD GLUCOSE TEST) test strip [id number] Yes Use as instructed Carollee Herter, Alferd Apa, DO Taking Active   hyoscyamine (LEVSIN SL) 0.125 MG SL tablet [id number] Yes TAKE 1 TABLET BY MOUTH TWICE A DAY AS NEEDED FOR CRAMPING OR DIARRHEA OR LOOSE STOOLS Lowne Chase, Yvonne R, DO Taking Active   JARDIANCE 10 MG TABS tablet [id number] Yes TAKE 1  TABLET BY MOUTH EVERY DAY Croitoru, Mihai, MD Taking Active   losartan (COZAAR) 25 MG tablet [id number] Yes TAKE 1/2 TABLET BY MOUTH AT BEDTIME. Croitoru, Mihai, MD Taking Active   Omega-3 Fatty Acids (FISH OIL) 1000 MG CAPS [id number] Yes Take 1,000 mg by mouth daily.  [provider] Taking Active Self  Potassium Chloride ER 20 MEQ TBCR [id number] Yes TAKE 1 TABLET BY MOUTH EVERY DAY Croitoru, Mihai, MD Taking Active   Probiotic Product (PRO-BIOTIC BLEND PO) [id number] Yes Take 1 tablet by mouth daily.  [provider] Taking Active Self  rosuvastatin (CRESTOR) 20 MG tablet [id number] Yes TAKE 1 TABLET BY MOUTH EVERY DAY Carollee Herter, Alferd Apa, DO Taking Active   Semaglutide,0.25 or 0.5MG/DOS, (OZEMPIC, 0.25 OR 0.5 MG/DOSE,) 2 MG/1.5ML SOPN [id number] Yes Inject 0.5 mg into the skin once a week. [name], DO Taking Active   TRUEplus Lancets 33G MISC [id number] Yes TEST BLOOD SUGAR TWICE DAILY IN THE MORNING AND AT BEDTIME [name], DO Taking Active             Patient Active Problem List   Diagnosis Date Noted   Preventative health care 04/30/2021   Hyperlipidemia associated with type 2 diabetes mellitus ([place]) 09/02/2020   Uncontrolled type 2 diabetes mellitus with hyperglycemia ([place]) 09/02/2020   Acute on chronic combined systolic and diastolic CHF (congestive heart failure) ([place]) 01/29/2020   S/P TAVR (transcatheter aortic valve replacement) 01/29/2020   Type 2 diabetes mellitus with complication (HCC)    Severe aortic stenosis    CKD (chronic kidney disease) stage 3, GFR 30-59 ml/min (HCC)    ICD (implantable cardioverter-defibrillator) in place 08/15/2019   Ventricular tachycardia    Anemia 06/27/2019   Severe obesity (BMI 35.0-35.9 with comorbidity) ([place]) 12/08/2018   DM (diabetes mellitus) type II uncontrolled, periph vascular disorder ([place]) 04/24/2015   Paroxysmal atrial flutter ([place]) 10/07/2013   GERD 01/15/2011   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, status post graft repair the ascending aorta 10/24/2007   Hyperlipidemia LDL goal <100 05/23/2007  Anxiety 05/23/2007   Primary hypertension 05/23/2007    Immunization History  Administered Date(s) Administered   Fluad Quad(high Dose 65+) 11/12/2019, 09/02/2020, 09/21/2021   Influenza Split 09/13/2011   Influenza Whole 09/19/2008, 09/09/2009, 10/15/2010   Influenza, High Dose Seasonal PF 10/14/2015   Influenza,inj,Quad PF,6+ Mos 10/11/2013, 08/23/2014   Influenza-Unspecified 09/10/2016   Moderna Covid-19 Vaccine Bivalent Booster 43yr & up 09/21/2021   Moderna SARS-COV2 Booster Vaccination 04/30/2021   Moderna Sars-Covid-2 Vaccination 01/15/2020, 02/12/2020   PFIZER(Purple Top)SARS-COV-2 Vaccination 09/29/2020   Pneumococcal Conjugate-13 10/14/2015   Pneumococcal Polysaccharide-23 01/09/2013   Td 09/01/2006   Zoster, Live 12/09/2010    Conditions to be addressed/monitored: CHF, HTN, HLD, DMII, CKD Stage 3, Anxiety, and IBS  Care Plan : General Pharmacy (Adult)  Updates made by ECherre Robins RPH-CPP since 10/25/2021 12:00 AM     Problem: Type 2 DM; HDL, HTN; CHF; IBS; anxiety   Priority: High     Long-Range Goal: Pharmacy related goal for chronic conditions and medication management   Start Date: 06/05/2021  This Visit's Progress: On track  Priority: High  Note:   Current Barriers:  Unable to maintain control of type 2 DM Education needed regarding type 2 DM Education needed regarding medications and proper administration  Pharmacist Clinical Goal(s):  Over the next 90 days, patient will achieve adherence to monitoring guidelines and medication adherence to achieve therapeutic efficacy achieve control of type 2 DM as evidenced by A1c < 7.0 maintain control of HTN, CHF and hyperlipidemia  as evidenced by maintaining goals mentioned below  through collaboration with PharmD and provider.    Interventions: 1:1 collaboration with LCarollee Herter YAlferd Apa DO regarding development and update of comprehensive plan of care as evidenced by provider attestation and co-signature Inter-disciplinary care team collaboration (see longitudinal plan of care) Comprehensive medication review performed; medication list updated in electronic medical record  Diabetes / Obesity (A1c goal <7.0): Uncontrolled; Last A1c was 7.1% Current treatment: Jardiance 126mdaily Ozempic 0.26m33meekly (started 05/05/2021)   Has lost about 8lbs since starting  Ozempic. She feels that Ozempic is helping with appetite control  Patient states cost of Ozempic was increased due to being in coverage gap - last was $240 / month. Sent application for patient assistance for 2022 but patient decided she does not want to apply Current glucose readings:  Range 128-135 Current meal patterns: Limiting calorie and carbohydrate intake. Notes that meal size is smaller with Ozempic.  Occasional nausea reported but tolerable per patient Current exercise: Walking in home about 3 times per day for about 10 to 20 minutes (total of 30 to 60 minutes per day)  Interventions:  Reviewed home blood glucose readings and reviewed goals  Fasting blood glucose goal (before meals) = 80 to 130 Blood glucose goal after a meal = less than 180  Continue Ozempic 0.26mg58mekly. (Patient declined patient assistance program)  Assessed for medication assistance program for Jardiance - patient does not qualify. Also would not qualify for Farxiga PAP. (Completed at previous visit)  Continue to limit sugar and carbohydrates (goal is 45 grams of carbohydrates per meal of less)  Continue to exercise with goal of at least 150 minutes per week.  Hypertension / CHF (HFmrEF) Controlled; Goal - BP <130/80 and to prevent CHF exacerbations and manage symptoms Current treatment: Carvedilol 226mg29mce a day Losartan 226mg 36mke 0.5 tablet = 12.26mg  d68my Furosemide 20mg or44mg - t34m1 tablet daily depending on weight and swelling Potassium chloride 20  mEq - take 1 tablet daily  Jardiance 32m daily  Denies hypotensive/hypertensive symptoms Home blood pressure: 110 - 1454/ 609'WDiastolic CHF EF = 411-91%(01/28/2021) NYHA class 1 Interventions:  Encouraged her to continue to check weight daily and report increases of more than 3lbs in 24 hours or 5lbs in 1 week Reviewed each medication above and benefits for heart health Continue current therapy for BP and heart Patient did note that cutting recent losartan in half was hard due to recent refill being round instead of oblong tablets. Recommended she ask pharmacy to get oblong tablets with next refills. If that is not an option, then could trial change ARB to valsartan 460mdaily or   Hyperlipidemia: Controlled; LDL goal per cardiologist <100 Current treatment: Rosuvastatin 2069maily  Omega 3 Fatty Acids - 1000m61mily Medications previously tried: none noted  Per refill history last rosuvastatin fill was 08/21/2021 for 90 day supply - improved adherence noted based on patient report and refill history. Interventions:  Reviewed LDL goal Continue current therapy for lipids / heart health  Anxiety Controlled Current treatment: Citalopram 20mg47m 40mg 47met - takes 1 tablet of each to equal total daily dose of 60mg  52mazolam 0.5mg up 59m3 times a day if needed for anxiety  Not currently connected with mental health support but patient has been stable for the last year.  Interventions:  Recommended continue current therapy for anxiety  Patient Goals/Self-Care Activities Over the next 90 days, patient will:  take medications as prescribed,  check glucose 1 time a day, document, and provide at future appointments,  check blood pressure 1 to 2 time per week, document, and provide at future appointments,  weigh daily, and contact provider if weight gain of more than 3 lbs in 24  hours or 5 lbs in 1 week,  collaborate with provider on medication access solutions when needed target a minimum of 150 minutes of moderate intensity exercise weekly  Follow Up Plan: The care management team will reach out to the patient again over the next 90 days.      Medication Assistance: Application for Ozempic  medication assistance program mailed to patient but she has decided not to apply for medication assistance. Assessed for MAP for Jardiance and Farxiga (patient income too high for [place] and [place] program or BI cares program)   [facility] pharmacy [facility]:  [id number] [id number] [phone] [phone] Up:  Patient agrees to Care Plan and Follow-up.  Plan: The care management team will reach out to the patient again over the next 90 days.   Fahima Cifelli EckCherre RobinsClinical Pharmacist [place] [phone]

## 2021-10-25 NOTE — Patient Instructions (Signed)
[name],  It was a pleasure speaking with you today.  I have attached a summary of our visit today and information about your health goals.   Our next appointment is by telephone on February 7th, 2023 at 10am  Please call the care guide team at [phone] if you need to cancel or reschedule your appointment.    If you have any questions or concerns, please feel free to contact me either at the phone number below or with a MyChart message.   Keep up the good work!  [name], PharmD Clinical Pharmacist [facility] [phone] (direct line)  [phone] (main office number)   Diabetes / Weight Management (A1c goal <7.0): Last A1c was 7.1% Current treatment: Jardiance 10mg  daily Ozempic 0.25mg  weekly for 4 weeks, then 0.5mg  thereafter (started 05/05/2021)  Interventions:  Reviewed home blood glucose readings and reviewed goals  Fasting blood glucose goal (before meals) = 80 to 130 Blood glucose goal after a meal = less than 180  Continue to limit sugar and carbohydrates (goal is 45 grams of carbohydrates per meal of less)  Continue to exercise with goal of at least 150 minutes per week. Reminded to get yearly diabetic eye exam  Hypertension / Heart Controlled; Goal - BP <130/80 and to prevent CHF exacerbations and manage symptoms Current treatment: Carvedilol 25mg  twice a day Losartan 25mg  - take 0.5 tablet = 12.5mg  daily Furosemide 20mg  or 40mg  - take 1 tablet daily depending on weight and swelling Potassium chloride 20 mEq - take 1 tablet daily  Jardiance 10mg  daily  Interventions:  Continue to check weight daily and report increases of more than 3lbs in 24 hours or 5lbs in 1 week Reviewed each medication above and benefits for heart health Continue to limit sodium intake to <2300 mg per day Continue current therapy for blood pressure and heart  Hyperlipidemia: Controlled; LDL goal  <100 (last LDL was 83) current  treatment: Rosuvastatin 20mg  daily  Omega 3 Fatty Acids - 1000mg  daily Interventions:  Reviewed LDL goal Continue current therapy for lipids / heart health  Anxiety Current treatment: Citalopram 20mg  and 40mg  tablet - take 1 tablet of each to equal total daily dose of 60mg   Alprazolam 0.5mg  up to 3 times a day if needed for anxiety  Interventions:  Recommend continue current therapy for anxiety  Patient Goals/Self-Care Activities Over the next 90 days, patient will:  take medications as prescribed,  check glucose 1 time a day, document, and provide at future appointments,  check blood pressure 1 to 2 time per week, document, and provide at future appointments,  weigh daily, and contact provider if weight gain of more than 3 lbs in 24 hours or 5 lbs in 1 week,  collaborate with provider on medication access solutions when needed target a minimum of 150 minutes of moderate intensity exercise weekly   Patient verbalizes understanding of instructions provided today and agrees to view in [place].

## 2021-10-28 DIAGNOSIS — E785 Hyperlipidemia, unspecified: Secondary | ICD-10-CM

## 2021-10-28 DIAGNOSIS — I110 Hypertensive heart disease with heart failure: Secondary | ICD-10-CM | POA: Diagnosis not present

## 2021-10-28 DIAGNOSIS — I5043 Acute on chronic combined systolic (congestive) and diastolic (congestive) heart failure: Secondary | ICD-10-CM

## 2021-10-28 DIAGNOSIS — Z87891 Personal history of nicotine dependence: Secondary | ICD-10-CM | POA: Diagnosis not present

## 2021-10-28 DIAGNOSIS — Z7985 Long-term (current) use of injectable non-insulin antidiabetic drugs: Secondary | ICD-10-CM | POA: Diagnosis not present

## 2021-10-28 DIAGNOSIS — E118 Type 2 diabetes mellitus with unspecified complications: Secondary | ICD-10-CM

## 2021-10-30 NOTE — Progress Notes (Signed)
Subjective:   [name] is a 75 y.o. female who presents for Medicare Annual (Subsequent) preventive examination.  I connected with [place] today by telephone and verified that I am speaking with the correct person using two identifiers. Location patient: home Location provider: work Persons participating in the virtual visit: patient, Marine scientist.    I discussed the limitations, risks, security and privacy concerns of performing an evaluation and management service by telephone and the availability of in person appointments. I also discussed with the patient that there may be a patient responsible charge related to this service. The patient expressed understanding and verbally consented to this telephonic visit.    Interactive audio and video telecommunications were attempted between this provider and patient, however failed, due to patient having technical difficulties OR patient did not have access to video capability.  We continued and completed visit with audio only.  Some vital signs may be absent or patient reported.   Time Spent with patient on telephone encounter: 40 minutes   Review of Systems     Cardiac Risk Factors include: advanced age (>78men, >23 women);diabetes mellitus;dyslipidemia;hypertension;obesity (BMI >30kg/m2)     Objective:    Today's Vitals   11/02/21 1343  Weight: 180 lb (81.6 kg)  Height: 5\' 1"  (1.549 m)   Body mass index is 34.01 kg/m.  Advanced Directives 11/02/2021 01/30/2020 01/29/2020 01/25/2020 01/09/2020 12/31/2019 12/12/2019  Does Patient Have a Medical Advance Directive? Yes Yes Yes Yes Yes Yes No  Type of Paramedic of [place];Living will [place];Living will [place];Living will [place];Living will [place];Living will [place];Living will -  Does patient want to make changes to medical advance directive? - No - Patient  declined - - No - Patient declined - -  Copy of [place] in Chart? No - copy requested No - copy requested No - copy requested - No - copy requested - -  Would patient like information on creating a medical advance directive? - - - - - - No - Patient declined  Pre-existing out of facility DNR order (yellow form or pink MOST form) - - - - - - -    Current Medications (verified) Outpatient Encounter Medications as of 11/02/2021  Medication Sig   ALPRAZolam (XANAX) 0.5 MG tablet TAKE 1 TABLET BY MOUTH THREE TIMES A DAY AS NEEDED   amoxicillin (AMOXIL) 500 MG tablet TAKE 4 TABLETS BY MOUTH 1 HOUR PRIOR TO DENTAL WORK, INCLUDING CLEANINGS   Ascorbic Acid (VITAMIN C) 1000 MG tablet Take 1,000 mg by mouth daily.   Blood Glucose Calibration (TRUE METRIX LEVEL 1) Low SOLN 1 each by In Vitro route in the morning and at bedtime.   Blood Glucose Monitoring Suppl (TRUE METRIX METER) DEVI 1 each by Does not apply route in the morning and at bedtime.   carvedilol (COREG) 25 MG tablet TAKE 1 TABLET BY MOUTH TWICE A DAY WITH MEALS   citalopram (CELEXA) 20 MG tablet TAKE 1 TABLET EVERY DAY ALONG WITH 40MG  TO EQUAL 60MG  TOTAL   citalopram (CELEXA) 40 MG tablet TAKE 1 TABLET EVERY DAY ALONG WITH 20MG  TO EQUAL 60MG  TOTAL   [facility] ASPIRIN ADULT LOW DOSE 81 MG chewable tablet CHEW 1 TABLET BY MOUTH DAILY.   fluticasone (FLONASE) 50 MCG/ACT nasal spray USE 2 SPRAYS IN EACH NOSTIRL EVERY DAY   furosemide (LASIX) 20 MG tablet TAKE 1 TABLET BY MOUTH EVERY DAY  furosemide (LASIX) 40 MG tablet Take 1 tablet (40 mg total) by mouth daily.   glucose blood (TRUE METRIX BLOOD GLUCOSE TEST) test strip Use as instructed   hyoscyamine (LEVSIN SL) 0.125 MG SL tablet TAKE 1 TABLET BY MOUTH TWICE A DAY AS NEEDED FOR CRAMPING OR DIARRHEA OR LOOSE STOOLS   JARDIANCE 10 MG TABS tablet TAKE 1 TABLET BY MOUTH EVERY DAY   losartan (COZAAR) 25 MG tablet TAKE 1/2 TABLET BY MOUTH AT BEDTIME.   Omega-3 Fatty Acids (FISH  OIL) 1000 MG CAPS Take 1,000 mg by mouth daily.    Potassium Chloride ER 20 MEQ TBCR TAKE 1 TABLET BY MOUTH EVERY DAY   Probiotic Product (PRO-BIOTIC BLEND PO) Take 1 tablet by mouth daily.    rosuvastatin (CRESTOR) 20 MG tablet TAKE 1 TABLET BY MOUTH EVERY DAY   Semaglutide,0.25 or 0.5MG /DOS, (OZEMPIC, 0.25 OR 0.5 MG/DOSE,) 2 MG/1.5ML SOPN Inject 0.5 mg into the skin once a week.   TRUEplus Lancets 33G MISC TEST BLOOD SUGAR TWICE DAILY IN THE MORNING AND AT BEDTIME   Facility-Administered Encounter Medications as of 11/02/2021  Medication   diclofenac sodium (VOLTAREN) 1 % transdermal gel 2 g    Allergies (verified) Oxycodone, Vicodin [hydrocodone-acetaminophen], Latex, Neomycin-bacitracin zn-polymyx, and Sulfonamide derivatives   History: Past Medical History:  Diagnosis Date   Acute thoracic aortic dissection 2008   emergency surgery - [facility]   AICD (automatic cardioverter/defibrillator) present    Anxiety    Cancer ([place])    skin   CKD (chronic kidney disease) stage 3, GFR 30-59 ml/min (HCC)    Diabetes mellitus    Diverticulosis    GERD (gastroesophageal reflux disease)    Hemorrhoids    History of kidney stones    Hyperlipidemia    Hypertension    Kidney stones    S/P TAVR (transcatheter aortic valve replacement) 01/29/2020   s/p TAVR with a 23 mm Edwards Sapien 3 Ultra via the TA approach wtih Dr. Burt Knack and Dr. Cyndia Bent   Severe aortic stenosis    Sinus node dysfunction (HCC)    hx of PPM   Sustained ventricular tachycardia    s/p ICD 05/2019   Past Surgical History:  Procedure Laterality Date   ABDOMINAL HYSTERECTOMY  1984   APPENDECTOMY     EYE SURGERY  2014   Rght eye   ICD IMPLANT N/A 06/28/2019   Procedure: PPM upgrade to ICD;  Surgeon: Evans Lance, MD;  Location: [facility];  Service: Cardiovascular;  Laterality: N/A;   PACEMAKER GENERATOR CHANGE  2014   Medtronic adapta   PACEMAKER GENERATOR CHANGE N/A 08/29/2013   Procedure: PACEMAKER  GENERATOR CHANGE;  Surgeon: Sanda Klein, MD;  Location: [place];  Service: Cardiovascular;  Laterality: N/A;   PACEMAKER INSERTION  08/2006   dual chamber Medtronic EnRhythm; r/t sinus node dysfunction    REPAIR OF ACUTE ASCENDING THORACIC AORTIC DISSECTION  2008   Dr. Servando Snare   RIGHT/LEFT HEART CATH AND CORONARY ANGIOGRAPHY N/A 01/09/2020   Procedure: RIGHT/LEFT HEART CATH AND CORONARY ANGIOGRAPHY;  Surgeon: Sherren Mocha, MD;  Location: [facility];  Service: Cardiovascular;  Laterality: N/A;   TEE WITHOUT CARDIOVERSION N/A 01/29/2020   Procedure: TRANSESOPHAGEAL ECHOCARDIOGRAM (TEE);  Surgeon: Sherren Mocha, MD;  Location: [facility];  Service: Open Heart Surgery;  Laterality: N/A;   THORACIC AORTIC ANEURYSM REPAIR  2000   type III   TRANSCATHETER AORTIC VALVE REPLACEMENT, TRANSAPICAL N/A 01/29/2020   Procedure: TRANSCATHETER AORTIC VALVE REPLACEMENT, TRANSAPICAL  using a Edwards Sapien 3, 59mm  Transcatheter Heart Valve;  Surgeon: Sherren Mocha, MD;  Location: [facility];  Service: Open Heart Surgery;  Laterality: N/A;   TRANSTHORACIC ECHOCARDIOGRAM  09/20/2012   EF=>55% with mild conc LVH; LA mod dilated; RA mildly dilated; mild MR/TR/AR   Family History  Problem Relation Age of Onset   Kidney disease Mother    Heart attack Father    Heart attack Maternal Grandmother    Valvular heart disease Son        valve replacement at 63   Cancer Neg Hx    Social History   Socioeconomic History   Marital status: Widowed    Spouse name: Not on file   Number of children: 2   Years of education: Not on file   Highest education level: Not on file  Occupational History    Employer: RETIRED  Tobacco Use   Smoking status: Former    Types: Cigarettes    Quit date: 09/04/1993    Years since quitting: 28.1   Smokeless tobacco: Never  Vaping Use   Vaping Use: Never used  Substance and Sexual Activity   Alcohol use: No   Drug use: No   Sexual activity: Not Currently  Other Topics  Concern   Not on file  Social History Narrative   Lives at home and husband was just moved to [place]   Social Determinants of Health   Financial Resource Strain: Medium Risk   Difficulty of Paying Living Expenses: Somewhat hard  Food Insecurity: No Food Insecurity   Worried About Charity fundraiser in the Last Year: Never true   Ran Out of Food in the Last Year: Never true  Transportation Needs: No Transportation Needs   Lack of Transportation (Medical): No   Lack of Transportation (Non-Medical): No  Physical Activity: Insufficiently Active   Days of Exercise per Week: 4 days   Minutes of Exercise per Session: 30 min  Stress: No Stress Concern Present   Feeling of Stress : Not at all  Social Connections: Moderately Isolated   Frequency of Communication with Friends and Family: More than three times a week   Frequency of Social Gatherings with Friends and Family: Once a week   Attends Religious Services: 1 to 4 times per year   Active Member of [employer] or Organizations: No   Attends Archivist Meetings: Never   Marital Status: Widowed    Tobacco Counseling Counseling given: Not Answered   Clinical Intake:  Pre-visit preparation completed: Yes        BMI - recorded: 34.01 Nutritional Status: BMI > 30  Obese Nutritional Risks: None Diabetes: Yes CBG done?: No Did pt. bring in CBG monitor from home?: No  How often do you need to have someone help you when you read instructions, pamphlets, or other written materials from your doctor or pharmacy?: 1 - Never  Diabetes:  Is the patient diabetic?  Yes  If diabetic, was a CBG obtained today?  No  Did the patient bring in their glucometer from home?  No phone visit How often do you monitor your CBG's? Daily.   Financial Strains and Diabetes Management:  Are you having any financial strains with the device, your supplies or your medication? No .  Does the patient want to be seen by Chronic Care  Management for management of their diabetes?  No  Would the patient like to be referred to a Nutritionist or for Diabetic Management?  No  Diabetic Exams:  Diabetic Eye Exam: . Overdue for diabetic eye exam. Pt has been advised about the importance in completing this exam. Advised to make an appt.  Diabetic Foot Exam: Completed 04/30/2021.    Interpreter Needed?: No  Information entered by :: Caroleen Hamman LPN   Activities of Daily Living In your present state of health, do you have any difficulty performing the following activities: 11/02/2021 04/30/2021  Hearing? N N  Vision? N N  Difficulty concentrating or making decisions? N N  Walking or climbing stairs? Y Y  Comment stairs -  Dressing or bathing? N N  Doing errands, shopping? N N  Preparing Food and eating ? N -  Using the Toilet? N -  In the past six months, have you accidently leaked urine? N -  Do you have problems with loss of bowel control? N -  Managing your Medications? N -  Managing your Finances? N -  Housekeeping or managing your Housekeeping? N -  Some recent data might be hidden    Patient Care Team: Carollee Herter, Alferd Apa, DO as PCP - General (Family Medicine) Croitoru, Dani Gobble, MD as PCP - Cardiology (Cardiology) Luretha Rued, RN as Case Manager Cyndia Bent, [name], MD as Consulting Physician (Cardiothoracic Surgery) Royston Cowper, DDS (Dentistry) [name], MD as Consulting Physician (Ophthalmology) [name], RPH-CPP (Pharmacist)  Indicate any recent Medical Services you may have received from other than [facility] providers in the past year (date may be approximate).     Assessment:   This is a routine wellness examination for [facility].  Hearing/Vision screen Hearing Screening - Comments:: No issues Vision Screening - Comments:: Last eye exam-over a year ago-Dr. Satira Sark  Dietary issues and exercise activities discussed: Current Exercise Habits: Home exercise routine, Type of exercise: walking,  Time (Minutes): 20, Frequency (Times/Week): 7, Weekly Exercise (Minutes/Week): 140, Intensity: Mild, Exercise limited by: None identified   Goals Addressed             This Visit's Progress    Patient Stated       Would like to lose some more weight       Depression Screen PHQ 2/9 Scores 11/02/2021 04/30/2021 04/30/2021 09/02/2020 05/11/2018 04/22/2016 10/14/2015  PHQ - 2 Score 0 0 0 0 1 0 0  Exception Documentation - - - - - Patient refusal -    Fall Risk Fall Risk  11/02/2021 08/11/2021 05/25/2021 04/30/2021 09/02/2020  Falls in the past year? 0 1 1 0 1  Comment - - feet got tangled in the bed covers about three weeks ago - -  Number falls in past yr: 0 0 0 0 0  Injury with Fall? 0 1 0 0 0  Comment - occurred when her pacemaker "died" - - -  Risk for fall due to : - History of fall(s);Medication side effect - - -  Follow up Falls prevention discussed Falls prevention discussed;Falls evaluation completed - Falls evaluation completed -    FALL RISK PREVENTION PERTAINING TO THE HOME:  Any stairs in or around the home? No  Home free of loose throw rugs in walkways, pet beds, electrical cords, etc? Yes  Adequate lighting in your home to reduce risk of falls? Yes   ASSISTIVE DEVICES UTILIZED TO PREVENT FALLS:  Life alert? No  Use of a cane, walker or w/c? No  Grab bars in the bathroom? Yes  Shower chair or bench in shower? Yes  Elevated toilet seat or a handicapped toilet? No  TIMED UP AND GO:  Was the test performed? No . Phone visit   Cognitive Function:Normal cognitive status assessed by this Nurse Health Advisor. No abnormalities found.          Immunizations Immunization History  Administered Date(s) Administered   Fluad Quad(high Dose 65+) 11/12/2019, 09/02/2020, 09/21/2021   Influenza Split 09/13/2011   Influenza Whole 09/19/2008, 09/09/2009, 10/15/2010   Influenza, High Dose Seasonal PF 10/14/2015   Influenza,inj,Quad PF,6+ Mos 10/11/2013, 08/23/2014    Influenza-Unspecified 09/10/2016   Moderna Covid-19 Vaccine Bivalent Booster 7yrs & up 09/21/2021   Moderna SARS-COV2 Booster Vaccination 04/30/2021   Moderna Sars-Covid-2 Vaccination 01/15/2020, 02/12/2020   PFIZER(Purple Top)SARS-COV-2 Vaccination 09/29/2020   Pneumococcal Conjugate-13 10/14/2015   Pneumococcal Polysaccharide-23 01/09/2013   Td 09/01/2006   Zoster, Live 12/09/2010    TDAP status: Due, Education has been provided regarding the importance of this vaccine. Advised may receive this vaccine at local pharmacy or Health Dept. Aware to provide a copy of the vaccination record if obtained from local pharmacy or Health Dept. Verbalized acceptance and understanding.  Flu Vaccine status: Up to date  Pneumococcal vaccine status: Up to date  Covid-19 vaccine status: Completed vaccines  Qualifies for Shingles Vaccine? Yes   Zostavax completed Yes   Shingrix Completed?: No.    Education has been provided regarding the importance of this vaccine. Patient has been advised to call insurance company to determine out of pocket expense if they have not yet received this vaccine. Advised may also receive vaccine at local pharmacy or Health Dept. Verbalized acceptance and understanding.  Screening Tests Health Maintenance  Topic Date Due   Zoster Vaccines- Shingrix (1 of 2) Never done   TETANUS/TDAP  09/01/2016   OPHTHALMOLOGY EXAM  01/21/2021   HEMOGLOBIN A1C  10/30/2021   MAMMOGRAM  04/30/2022 (Originally 09/14/2013)   COLONOSCOPY (Pts 45-58yrs Insurance coverage will need to be confirmed)  05/11/2029 (Originally 06/22/2014)   FOOT EXAM  04/30/2022   Pneumonia Vaccine 42+ Years old  Completed   INFLUENZA VACCINE  Completed   DEXA SCAN  Completed   COVID-19 Vaccine  Completed   Hepatitis C Screening  Completed   HPV VACCINES  Aged Out    Health Maintenance  Health Maintenance Due  Topic Date Due   Zoster Vaccines- Shingrix (1 of 2) Never done   TETANUS/TDAP  09/01/2016    OPHTHALMOLOGY EXAM  01/21/2021   HEMOGLOBIN A1C  10/30/2021    Colorectal cancer screening: Declined today. May call back at a later date for Cologuard.  Mammogram status: Declined  Bone Density status:  Declined  Lung Cancer Screening: (Low Dose CT Chest recommended if Age 56-80 years, 30 pack-year currently smoking OR have quit w/in 15years.) does not qualify.     Additional Screening:  Hepatitis C Screening: Completed 04/22/2016  Vision Screening: Recommended annual ophthalmology exams for early detection of glaucoma and other disorders of the eye. Is the patient up to date with their annual eye exam?  No  Who is the provider or what is the name of the office in which the patient attends annual eye exams? Dr. Satira Sark   Dental Screening: Recommended annual dental exams for proper oral hygiene  Community Resource Referral / Chronic Care Management: CRR required this visit?  No   CCM required this visit?  No      Plan:     I have personally reviewed and noted the following in the patient's chart:   Medical and social history Use of alcohol, tobacco  or illicit drugs  Current medications and supplements including opioid prescriptions.  Functional ability and status Nutritional status Physical activity Advanced directives List of other physicians Hospitalizations, surgeries, and ER visits in previous 12 months Vitals Screenings to include cognitive, depression, and falls Referrals and appointments  In addition, I have reviewed and discussed with patient certain preventive protocols, quality metrics, and best practice recommendations. A written personalized care plan for preventive services as well as general preventive health recommendations were provided to patient.   Due to this being a telephonic visit, the after visit summary with patients personalized plan was offered to patient via mail or my-chart.  Patient would like to access on my-chart.  Marta Antu,  LPN   16/11/958  Nurse Health Advisor  Nurse Notes: None

## 2021-11-02 ENCOUNTER — Ambulatory Visit (INDEPENDENT_AMBULATORY_CARE_PROVIDER_SITE_OTHER): Payer: Medicare HMO

## 2021-11-02 VITALS — Ht 61.0 in | Wt 180.0 lb

## 2021-11-02 DIAGNOSIS — Z0000 Encounter for general adult medical examination without abnormal findings: Secondary | ICD-10-CM | POA: Diagnosis not present

## 2021-11-02 NOTE — Patient Instructions (Signed)
[name] , Thank you for taking time to complete your Medicare Wellness Visit. I appreciate your ongoing commitment to your health goals. Please review the following plan we discussed and let me know if I can assist you in the future.   Screening recommendations/referrals: Colonoscopy: Declined today. Please call the office to schedule if you change your mind. Mammogram: Declined today. Please call the office to schedule if you change your mind. Bone Density: Declined today. Please call the office to schedule if you change your mind. Recommended yearly ophthalmology/optometry visit for glaucoma screening and checkup Recommended yearly dental visit for hygiene and checkup  Vaccinations: Influenza vaccine: Up to date Pneumococcal vaccine: Up to date Tdap vaccine: Discuss with pharmacy Shingles vaccine: Discuss with pharmacy   Covid-19:Up to date  Advanced directives: Please bring a copy of Living Will and/or Healthcare Power of Attorney for your chart.   Conditions/risks identified: See problem list  Next appointment: Follow up in one year for your annual wellness visit    Preventive Care 65 Years and Older, Female Preventive care refers to lifestyle choices and visits with your health care provider that can promote health and wellness. What does preventive care include? A yearly physical exam. This is also called an annual well check. Dental exams once or twice a year. Routine eye exams. Ask your health care provider how often you should have your eyes checked. Personal lifestyle choices, including: Daily care of your teeth and gums. Regular physical activity. Eating a healthy diet. Avoiding tobacco and drug use. Limiting alcohol use. Practicing safe sex. Taking low-dose aspirin every day. Taking vitamin and mineral supplements as recommended by your health care provider. What happens during an annual well check? The services and screenings done by your health care  provider during your annual well check will depend on your age, overall health, lifestyle risk factors, and family history of disease. Counseling  Your health care provider may ask you questions about your: Alcohol use. Tobacco use. Drug use. Emotional well-being. Home and relationship well-being. Sexual activity. Eating habits. History of falls. Memory and ability to understand (cognition). Work and work Statistician. Reproductive health. Screening  You may have the following tests or measurements: Height, weight, and BMI. Blood pressure. Lipid and cholesterol levels. These may be checked every 5 years, or more frequently if you are over 16 years old. Skin check. Lung cancer screening. You may have this screening every year starting at age 63 if you have a 30-pack-year history of smoking and currently smoke or have quit within the past 15 years. Fecal occult blood test (FOBT) of the stool. You may have this test every year starting at age 62. Flexible sigmoidoscopy or colonoscopy. You may have a sigmoidoscopy every 5 years or a colonoscopy every 10 years starting at age 34. Hepatitis C blood test. Hepatitis B blood test. Sexually transmitted disease (STD) testing. Diabetes screening. This is done by checking your blood sugar (glucose) after you have not eaten for a while (fasting). You may have this done every 1-3 years. Bone density scan. This is done to screen for osteoporosis. You may have this done starting at age 81. Mammogram. This may be done every 1-2 years. Talk to your health care provider about how often you should have regular mammograms. Talk with your health care provider about your test results, treatment options, and if necessary, the need for more tests. Vaccines  Your health care provider may recommend certain vaccines, such as: Influenza vaccine. This is recommended every  year. Tetanus, diphtheria, and acellular pertussis (Tdap, Td) vaccine. You may need a Td  booster every 10 years. Zoster vaccine. You may need this after age 39. Pneumococcal 13-valent conjugate (PCV13) vaccine. One dose is recommended after age 3. Pneumococcal polysaccharide (PPSV23) vaccine. One dose is recommended after age 68. Talk to your health care provider about which screenings and vaccines you need and how often you need them. This information is not intended to replace advice given to you by your health care provider. Make sure you discuss any questions you have with your health care provider. Document Released: 12/12/2015 Document Revised: 08/04/2016 Document Reviewed: 09/16/2015 Elsevier Interactive Patient Education  2017 [employer] Prevention in the Home Falls can cause injuries. They can happen to people of all ages. There are many things you can do to make your home safe and to help prevent falls. What can I do on the outside of my home? Regularly fix the edges of walkways and driveways and fix any cracks. Remove anything that might make you trip as you walk through a door, such as a raised step or threshold. Trim any bushes or trees on the path to your home. Use bright outdoor lighting. Clear any walking paths of anything that might make someone trip, such as rocks or tools. Regularly check to see if handrails are loose or broken. Make sure that both sides of any steps have handrails. Any raised decks and porches should have guardrails on the edges. Have any leaves, snow, or ice cleared regularly. Use sand or salt on walking paths during winter. Clean up any spills in your garage right away. This includes oil or grease spills. What can I do in the bathroom? Use night lights. Install grab bars by the toilet and in the tub and shower. Do not use towel bars as grab bars. Use non-skid mats or decals in the tub or shower. If you need to sit down in the shower, use a plastic, non-slip stool. Keep the floor dry. Clean up any water that spills on the floor  as soon as it happens. Remove soap buildup in the tub or shower regularly. Attach bath mats securely with double-sided non-slip rug tape. Do not have throw rugs and other things on the floor that can make you trip. What can I do in the bedroom? Use night lights. Make sure that you have a light by your bed that is easy to reach. Do not use any sheets or blankets that are too big for your bed. They should not hang down onto the floor. Have a firm chair that has side arms. You can use this for support while you get dressed. Do not have throw rugs and other things on the floor that can make you trip. What can I do in the kitchen? Clean up any spills right away. Avoid walking on wet floors. Keep items that you use a lot in easy-to-reach places. If you need to reach something above you, use a strong step stool that has a grab bar. Keep electrical cords out of the way. Do not use floor polish or wax that makes floors slippery. If you must use wax, use non-skid floor wax. Do not have throw rugs and other things on the floor that can make you trip. What can I do with my stairs? Do not leave any items on the stairs. Make sure that there are handrails on both sides of the stairs and use them. Fix handrails that are broken or  loose. Make sure that handrails are as long as the stairways. Check any carpeting to make sure that it is firmly attached to the stairs. Fix any carpet that is loose or worn. Avoid having throw rugs at the top or bottom of the stairs. If you do have throw rugs, attach them to the floor with carpet tape. Make sure that you have a light switch at the top of the stairs and the bottom of the stairs. If you do not have them, ask someone to add them for you. What else can I do to help prevent falls? Wear shoes that: Do not have high heels. Have rubber bottoms. Are comfortable and fit you well. Are closed at the toe. Do not wear sandals. If you use a stepladder: Make sure that it is  fully opened. Do not climb a closed stepladder. Make sure that both sides of the stepladder are locked into place. Ask someone to hold it for you, if possible. Clearly mark and make sure that you can see: Any grab bars or handrails. First and last steps. Where the edge of each step is. Use tools that help you move around (mobility aids) if they are needed. These include: Canes. Walkers. Scooters. Crutches. Turn on the lights when you go into a dark area. Replace any light bulbs as soon as they burn out. Set up your furniture so you have a clear path. Avoid moving your furniture around. If any of your floors are uneven, fix them. If there are any pets around you, be aware of where they are. Review your medicines with your doctor. Some medicines can make you feel dizzy. This can increase your chance of falling. Ask your doctor what other things that you can do to help prevent falls. This information is not intended to replace advice given to you by your health care provider. Make sure you discuss any questions you have with your health care provider. Document Released: 09/11/2009 Document Revised: 04/22/2016 Document Reviewed: 12/20/2014 Elsevier Interactive Patient Education  2017 [employer].

## 2021-11-08 ENCOUNTER — Other Ambulatory Visit: Payer: Self-pay | Admitting: Family Medicine

## 2021-11-08 DIAGNOSIS — F419 Anxiety disorder, unspecified: Secondary | ICD-10-CM

## 2021-11-09 ENCOUNTER — Other Ambulatory Visit: Payer: Self-pay | Admitting: Family Medicine

## 2021-11-09 DIAGNOSIS — F419 Anxiety disorder, unspecified: Secondary | ICD-10-CM

## 2021-11-09 MED ORDER — ALPRAZOLAM 0.5 MG PO TABS: ORAL_TABLET | ORAL | 0 refills | Status: DC

## 2021-11-09 NOTE — Telephone Encounter (Signed)
You had refused rx earlier but patient sent this message below.  Patient comment: Dr. Carollee Herter, please refill my alprazolam .  It is due the 15th and we will be leaving out of town for the holidays. Thank you so much and blessings for the holiday season!  [name]

## 2021-11-24 ENCOUNTER — Ambulatory Visit (INDEPENDENT_AMBULATORY_CARE_PROVIDER_SITE_OTHER): Payer: Medicare HMO

## 2021-11-24 DIAGNOSIS — I495 Sick sinus syndrome: Secondary | ICD-10-CM | POA: Diagnosis not present

## 2021-11-24 LAB — CUP PACEART REMOTE DEVICE CHECK
Battery Remaining Longevity: 71 mo
Battery Voltage: 2.99 V
Brady Statistic AP VP Percent: 99.94 %
Brady Statistic AP VS Percent: 0 %
Brady Statistic AS VP Percent: 0.05 %
Brady Statistic AS VS Percent: 0 %
Brady Statistic RA Percent Paced: 99.94 %
Brady Statistic RV Percent Paced: 99.98 %
Date Time Interrogation Session: 20221227033625
HighPow Impedance: 82 Ohm
Implantable Lead Implant Date: 20071016
Implantable Lead Implant Date: 20071016
Implantable Lead Implant Date: 20200730
Implantable Lead Location: 753859
Implantable Lead Location: 753860
Implantable Lead Location: 753860
Implantable Lead Model: 4092
Implantable Lead Model: 5594
Implantable Lead Model: 6935
Implantable Pulse Generator Implant Date: 20200730
Lead Channel Impedance Value: 380 Ohm
Lead Channel Impedance Value: 494 Ohm
Lead Channel Impedance Value: 551 Ohm
Lead Channel Pacing Threshold Amplitude: 0.625 V
Lead Channel Pacing Threshold Amplitude: 0.625 V
Lead Channel Pacing Threshold Pulse Width: 0.4 ms
Lead Channel Pacing Threshold Pulse Width: 0.4 ms
Lead Channel Sensing Intrinsic Amplitude: 26.375 mV
Lead Channel Sensing Intrinsic Amplitude: 26.375 mV
Lead Channel Sensing Intrinsic Amplitude: 3.25 mV
Lead Channel Sensing Intrinsic Amplitude: 3.25 mV
Lead Channel Setting Pacing Amplitude: 1.5 V
Lead Channel Setting Pacing Amplitude: 2.5 V
Lead Channel Setting Pacing Pulse Width: 0.4 ms
Lead Channel Setting Sensing Sensitivity: 0.3 mV

## 2021-12-04 NOTE — Progress Notes (Signed)
Remote ICD transmission.   

## 2021-12-09 ENCOUNTER — Other Ambulatory Visit: Payer: Self-pay | Admitting: Cardiovascular Disease

## 2021-12-09 DIAGNOSIS — I10 Essential (primary) hypertension: Secondary | ICD-10-CM

## 2021-12-10 ENCOUNTER — Other Ambulatory Visit: Payer: Self-pay | Admitting: Family Medicine

## 2021-12-10 DIAGNOSIS — F419 Anxiety disorder, unspecified: Secondary | ICD-10-CM

## 2021-12-10 MED ORDER — ALPRAZOLAM 0.5 MG PO TABS: ORAL_TABLET | ORAL | 0 refills | Status: DC

## 2021-12-10 NOTE — Telephone Encounter (Signed)
Requesting: alprazolam 0.5mg  Contract: 09/02/2020 UDS: 09/02/2020 Last Visit: 04/30/2021 Next Visit: None Last Refill: 11/09/2021 #90 and 0RF Pt sig: 1 tab tid prn  Please Advise

## 2021-12-12 ENCOUNTER — Other Ambulatory Visit: Payer: Self-pay | Admitting: Family Medicine

## 2021-12-12 DIAGNOSIS — E1165 Type 2 diabetes mellitus with hyperglycemia: Secondary | ICD-10-CM

## 2021-12-14 ENCOUNTER — Telehealth: Payer: Self-pay

## 2021-12-14 ENCOUNTER — Other Ambulatory Visit: Payer: Self-pay | Admitting: Family Medicine

## 2021-12-14 DIAGNOSIS — E1165 Type 2 diabetes mellitus with hyperglycemia: Secondary | ICD-10-CM

## 2021-12-14 MED ORDER — OZEMPIC (0.25 OR 0.5 MG/DOSE) 2 MG/1.5ML ~~LOC~~ SOPN: 0.5000 mg | PEN_INJECTOR | SUBCUTANEOUS | 3 refills | Status: DC

## 2021-12-14 NOTE — Telephone Encounter (Signed)
Pt called very concerned abut getting her refill on Ozempic.  She stated [facility] [address], but they have been having a hard time getting it/keeping it in stock, so she is requesting the refill be sent and [facility] to [facility] [address].  Pt was also not aware that she needed a 72mo follow up appt and she tried to make several appts with me for the month of Feb, but the dates that worked for her, PCP was out of office.  Pt is set to come in for f/up on 02/02/22.  Pt wanted me to stress to PCP the Ozempic is really working for her and it has definitely lowered her blood sugar.

## 2021-12-14 NOTE — Telephone Encounter (Signed)
Rx sent 

## 2021-12-17 ENCOUNTER — Other Ambulatory Visit: Payer: Self-pay | Admitting: Cardiovascular Disease

## 2021-12-17 DIAGNOSIS — I5032 Chronic diastolic (congestive) heart failure: Secondary | ICD-10-CM

## 2021-12-18 ENCOUNTER — Telehealth: Payer: Self-pay

## 2021-12-18 NOTE — Telephone Encounter (Signed)
Pt called stating now that her [facility] [address] it is out of stock there.  They are "hoping" to get it in by next Wednesday, but if they do not, would pt be ok to skip a week of it and resume it once pharmacy gets it back in stock.  Please advise.

## 2021-12-21 NOTE — Telephone Encounter (Signed)
Pt waiting for pharmacy to get stock of Ozempic. Should be getting on [address]. Pt wants to know if it okay to skip a week?

## 2021-12-21 NOTE — Telephone Encounter (Signed)
Spoke with patient. Pt advised we have a sample available for her. Sample in the [facility] with her name on it. Pt will pick it up tomorrow.

## 2021-12-29 ENCOUNTER — Ambulatory Visit (INDEPENDENT_AMBULATORY_CARE_PROVIDER_SITE_OTHER): Payer: Medicare HMO

## 2021-12-29 DIAGNOSIS — E1169 Type 2 diabetes mellitus with other specified complication: Secondary | ICD-10-CM

## 2021-12-29 DIAGNOSIS — E785 Hyperlipidemia, unspecified: Secondary | ICD-10-CM | POA: Diagnosis not present

## 2021-12-29 DIAGNOSIS — I10 Essential (primary) hypertension: Secondary | ICD-10-CM | POA: Diagnosis not present

## 2021-12-29 NOTE — Chronic Care Management (AMB) (Signed)
Chronic Care Management   CCM RN Visit Note  12/29/2021 Name: [name] MRN: [id number] DOB: Jan 26, 1946  Subjective: [name] is a 76 y.o. year old female who is a primary care patient of [name], DO. The care management team was consulted for assistance with disease management and care coordination needs.    Engaged with patient by telephone for follow up visit in response to provider referral for case management and/or care coordination services.   Consent to Services:  The patient was given information about Chronic Care Management services, agreed to services, and gave verbal consent prior to initiation of services.  Please see initial visit note for detailed documentation.   Patient agreed to services and verbal consent obtained.   Assessment: Review of patient past medical history, allergies, medications, health status, including review of consultants reports, laboratory and other test data, was performed as part of comprehensive evaluation and provision of chronic care management services.   SDOH (Social Determinants of Health) assessments and interventions performed:    CCM Care Plan  Allergies  Allergen Reactions   Oxycodone Itching   Vicodin [Hydrocodone-Acetaminophen] Itching   Latex Itching and Rash   Neomycin-Bacitracin Zn-Polymyx Other (See Comments)    Red , swollen eye   Sulfonamide Derivatives Other (See Comments)    Just had reaction when taking during pregnancy- last child birth 80    Outpatient Encounter Medications as of 12/29/2021  Medication Sig Note   ALPRAZolam (XANAX) 0.5 MG tablet TAKE 1 TABLET BY MOUTH THREE TIMES A DAY AS NEEDED    amoxicillin (AMOXIL) 500 MG tablet TAKE 4 TABLETS BY MOUTH 1 HOUR PRIOR TO DENTAL WORK, INCLUDING CLEANINGS    Ascorbic Acid (VITAMIN C) 1000 MG tablet Take 1,000 mg by mouth daily.    carvedilol (COREG) 25 MG tablet TAKE 1 TABLET BY MOUTH TWICE A DAY WITH MEALS    citalopram (CELEXA) 20 MG  tablet TAKE 1 TABLET EVERY DAY ALONG WITH 40MG TO EQUAL 60MG TOTAL    citalopram (CELEXA) 40 MG tablet TAKE 1 TABLET EVERY DAY ALONG WITH 20MG TO EQUAL 60MG TOTAL    [facility] ASPIRIN ADULT LOW DOSE 81 MG chewable tablet CHEW 1 TABLET BY MOUTH DAILY.    fluticasone (FLONASE) 50 MCG/ACT nasal spray USE 2 SPRAYS IN [facility] NOSTIRL EVERY DAY 06/26/2021: Reports takes as needed   furosemide (LASIX) 20 MG tablet TAKE 1 TABLET BY MOUTH EVERY DAY 12/29/2021: Reports takes 20 mg every other day   furosemide (LASIX) 40 MG tablet Take 1 tablet (40 mg total) by mouth daily. 12/29/2021: Reports takes as needed   hyoscyamine (LEVSIN SL) 0.125 MG SL tablet TAKE 1 TABLET BY MOUTH TWICE A DAY AS NEEDED FOR CRAMPING OR DIARRHEA OR LOOSE STOOLS    JARDIANCE 10 MG TABS tablet TAKE 1 TABLET BY MOUTH EVERY DAY    losartan (COZAAR) 25 MG tablet TAKE 1/2 TABLET BY MOUTH AT BEDTIME.    Omega-3 Fatty Acids (FISH OIL) 1000 MG CAPS Take 1,000 mg by mouth daily.     Potassium Chloride ER 20 MEQ TBCR TAKE 1 TABLET BY MOUTH EVERY DAY    Probiotic Product (PRO-BIOTIC BLEND PO) Take 1 tablet by mouth daily.     rosuvastatin (CRESTOR) 20 MG tablet TAKE 1 TABLET BY MOUTH EVERY DAY    Semaglutide,0.25 or 0.5MG/DOS, (OZEMPIC, 0.25 OR 0.5 MG/DOSE,) 2 MG/1.5ML SOPN Inject 0.5 mg into the skin once a week.    Blood Glucose Calibration (TRUE METRIX LEVEL  1) Low SOLN 1 each by In Vitro route in the morning and at bedtime.    Blood Glucose Monitoring Suppl (TRUE METRIX METER) DEVI 1 each by Does not apply route in the morning and at bedtime.    glucose blood (TRUE METRIX BLOOD GLUCOSE TEST) test strip Use as instructed    TRUEplus Lancets 33G MISC TEST BLOOD SUGAR TWICE DAILY IN THE MORNING AND AT BEDTIME    Facility-Administered Encounter Medications as of 12/29/2021  Medication   diclofenac sodium (VOLTAREN) 1 % transdermal gel 2 g    Patient Active Problem List   Diagnosis Date Noted   Preventative health care 04/30/2021   Hyperlipidemia  associated with type 2 diabetes mellitus ([place]) 09/02/2020   Uncontrolled type 2 diabetes mellitus with hyperglycemia ([place]) 09/02/2020   Acute on chronic combined systolic and diastolic CHF (congestive heart failure) ([place]) 01/29/2020   S/P TAVR (transcatheter aortic valve replacement) 01/29/2020   Type 2 diabetes mellitus with complication (HCC)    Severe aortic stenosis    CKD (chronic kidney disease) stage 3, GFR 30-59 ml/min (HCC)    ICD (implantable cardioverter-defibrillator) in place 08/15/2019   Ventricular tachycardia    Anemia 06/27/2019   Severe obesity (BMI 35.0-35.9 with comorbidity) ([place]) 12/08/2018   DM (diabetes mellitus) type II uncontrolled, periph vascular disorder ([place]) 04/24/2015   Paroxysmal atrial flutter ([place]) 10/07/2013   GERD 01/15/2011   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, status post graft repair the ascending aorta 10/24/2007   Hyperlipidemia LDL goal <100 05/23/2007   Anxiety 05/23/2007   Primary hypertension 05/23/2007    Conditions to be addressed/monitored:CHF, HTN, HLD, and DMII   Care Plan : RN Care Manager Plan of Care  Updates made by Luretha Rued, RN since 12/29/2021 12:00 AM     Problem: Chronic Disease Management Education and/or Care Coordination needs   Priority: High     Long-Range Goal: Development of plan of care for Chronic Disease Management   Start Date: 12/29/2021  Expected End Date: 06/28/2022  Priority: High  Note:   Current Barriers: Reports, "I am doing wonderfully well". Blood sugar today was 110. Reports her blood sugars have been ranging 98-110. Denies any hypoglycemic episodes. She expresses concern that there is a waiting list at her local pharmacy to receive Ozempic. She reports current medication/dose received from her PCP office. She states she is following up weekly with her pharmacy to see if the medication has come in. She reports she is check blood pressure, but is not  recording. She reports last blood pressure was taken yesterday, but could not remember exact number estimating her pressure was around 140/60. She continues to remain active and does some type of activity every day such as walking in the house, housework, shopping. Expressing no specific health concerns at this time. Knowledge Deficits related to plan of care for management of CHF, HTN, HLD, and DMII  Chronic Disease Management support and education needs related to CHF, HTN, HLD, and DMII  RNCM Clinical Goal(s):  Patient will verbalize understanding of plan for management of CHF, HTN, HLD, and DMII as evidenced by self report and/or chart notation demonstrate ongoing health management independence as evidenced by attending provider visits as scheduled, taking medications as prescribed, following up with provider with questions or concerns as needed        through collaboration with RN Care manager, provider, and care team.   Interventions: 1:1 collaboration with primary  care provider regarding development and update of comprehensive plan of care as evidenced by provider attestation and co-signature Inter-disciplinary care team collaboration (see longitudinal plan of care) Evaluation of current treatment plan related to  self management and patient's adherence to plan as established by provider   Diabetes Interventions:  (Status:  Goal on track:  Yes.) Long Term Goal Assessed patient's understanding of A1c goal: <7% Encouraged to continue to check blood sugar per provider recommendation Upcoming appointments reviewed Encouraged to continue to take medications as prescribed Lab Results  Component Value Date   HGBA1C 7.1 (H) 04/30/2021   Hyperlipidemia Interventions:  (Status:  New goal.) Long Term Goal Medication review performed; medication list updated in electronic medical record.  Counseled on importance of regular laboratory monitoring as prescribed Provided HLD educational  materials  Hypertension/CHF  Interventions:  (Status:  New goal.) Long Term Goal Last practice recorded BP readings:  BP Readings from Last 3 Encounters:  04/30/21 126/84  03/02/21 130/78  01/28/21 120/80  Most recent eGFR/CrCl:  Lab Results  Component Value Date   EGFR 49 (L) 03/02/2021    No components found for: CRCL  Reviewed medications with patient and discussed importance of compliance Advised patient to discuss attend scheduled visits with provider and contact with any questions or concerns Discussed signs/symptoms of Heart failure exacerbation Encouraged to routinely take and record blood pressure and take to provider office visits  Patient Goals/Self-Care Activities: Take medications as prescribed   Attend all scheduled provider appointments use salt in moderation manage portion size Continue to eat healthy: lean meats, fruits, vegetables, avoid foods that are high in saturated fats and transfats, avoid processed foods. Continue to remain active Begin to record blood pressure readings. Ask you provider what is your Target Blood pressure range   Plan:Telephone follow up appointment with care management team member scheduled for:  02/23/22 The patient has been provided with contact information for the care management team and has been advised to call with any health related questions or concerns.   Thea Silversmith, RN, MSN, BSN, CCM Care Management Coordinator [facility] [phone]

## 2021-12-29 NOTE — Patient Instructions (Signed)
Visit Information  Thank you for taking time to visit with me today. Please don't hesitate to contact me if I can be of assistance to you before our next scheduled telephone appointment.  Following are the goals we discussed today:  Patient Goals/Self-Care Activities: Take medications as prescribed   Attend all scheduled provider appointments use salt in moderation manage portion size Continue to eat healthy: lean meats, fruits, vegetables, avoid foods that are high in saturated fats and transfats, avoid processed foods. Continue to remain active Begin to record blood pressure readings. Ask you provider what is your Target Blood pressure range  Our next appointment is by telephone on 02/23/22 at 2:00 pm  Please call the care guide team at [phone] if you need to cancel or reschedule your appointment.   If you are experiencing a Mental Health or [place] or need someone to talk to, please call the Suicide and Crisis Lifeline: 988 call 1-800-273-TALK (toll free, 24 hour hotline)   Patient verbalizes understanding of instructions and care plan provided today and agrees to view in [place]. Active MyChart status confirmed with patient.    Thea Silversmith, RN, MSN, BSN, CCM Care Management Coordinator [facility] [phone]   Preventing High Cholesterol Cholesterol is a white, waxy substance similar to fat that the human body needs to help build cells. The liver makes all the cholesterol that a person's body needs. Having high cholesterol (hypercholesterolemia) increases your risk for heart disease and stroke. Extra or excess cholesterol comes from the food that you eat. High cholesterol can often be prevented with diet and lifestyle changes. If you already have high cholesterol, you can control it with diet, lifestyle changes, and medicines. How can high cholesterol affect me? If you have high cholesterol, fatty deposits (plaques) may build up on the walls  of your blood vessels. The blood vessels that carry blood away from your heart are called arteries. Plaques make the arteries narrower and stiffer. This in turn can: Restrict or block blood flow and cause blood clots to form. Increase your risk for heart attack and stroke. What can increase my risk for high cholesterol? This condition is more likely to develop in people who: Eat foods that are high in saturated fat or cholesterol. Saturated fat is mostly found in foods that come from animal sources. Are overweight. Are not getting enough exercise. Use products that contain nicotine or tobacco, such as cigarettes, e-cigarettes, and chewing tobacco. Have a family history of high cholesterol (familial hypercholesterolemia). What actions can I take to prevent this? Nutrition  Eat less saturated fat. Avoid trans fats (partially hydrogenated oils). These are often found in margarine and in some baked goods, fried foods, and snacks bought in packages. Avoid precooked or cured meat, such as bacon, sausages, or meat loaves. Avoid foods and drinks that have added sugars. Eat more fruits, vegetables, and whole grains. Choose healthy sources of protein, such as fish, poultry, lean cuts of red meat, beans, peas, lentils, and nuts. Choose healthy sources of fat, such as: Nuts. Vegetable oils, especially olive oil. Fish that have healthy fats, such as omega-3 fatty acids. These fish include mackerel or salmon. Lifestyle Lose weight if you are overweight. Maintaining a healthy body mass index (BMI) can help prevent or control high cholesterol. It can also lower your risk for diabetes and high blood pressure. Ask your health care provider to help you with a diet and exercise plan to lose weight safely. Do not use any products  that contain nicotine or tobacco. These products include cigarettes, chewing tobacco, and vaping devices, such as e-cigarettes. If you need help quitting, ask your health care  provider. Alcohol use Do not drink alcohol if: Your health care provider tells you not to drink. You are pregnant, may be pregnant, or are planning to become pregnant. If you drink alcohol: Limit how much you have to: 0-1 drink a day for women. 0-2 drinks a day for men. Know how much alcohol is in your drink. In the [place] one drink equals one 12 oz bottle of beer (355 mL), one 5 oz glass of wine (148 mL), or one 1 oz glass of hard liquor (44 mL). Activity  Get enough exercise. Do exercises as told by your health care provider. Each week, do at least 150 minutes of exercise that takes a medium level of effort (moderate-intensity exercise). This kind of exercise: Makes your heart beat faster while allowing you to still be able to talk. Can be done in short sessions several times a day or longer sessions a few times a week. For example, on 5 days each week, you could walk fast or ride your bike 3 times a day for 10 minutes each time. Medicines Your health care provider may recommend medicines to help lower cholesterol. This may be a medicine to lower the amount of cholesterol that your liver makes. You may need medicine if: Diet and lifestyle changes have not lowered your cholesterol enough. You have high cholesterol and other risk factors for heart disease or stroke. Take over-the-counter and prescription medicines only as told by your health care provider. General information Manage your risk factors for high cholesterol. Talk with your health care provider about all your risk factors and how to lower your risk. Manage other conditions that you have, such as diabetes or high blood pressure (hypertension). Have blood tests to check your cholesterol levels at regular points in time as told by your health care provider. Keep all follow-up visits. This is important. Where to find more information American Heart Association: www.heart.org National Heart, Lung, and Blood Institute:  https://wilson-eaton.com/ Summary High cholesterol increases your risk for heart disease and stroke. By keeping your cholesterol level low, you can reduce your risk for these conditions. High cholesterol can often be prevented with diet and lifestyle changes. Work with your health care provider to manage your risk factors, and have your blood tested regularly. This information is not intended to replace advice given to you by your health care provider. Make sure you discuss any questions you have with your health care provider. Document Revised: 01/19/2021 Document Reviewed: 01/19/2021 Elsevier Patient Education  2022 [employer].

## 2021-12-31 ENCOUNTER — Ambulatory Visit: Payer: Medicare HMO

## 2021-12-31 DIAGNOSIS — E785 Hyperlipidemia, unspecified: Secondary | ICD-10-CM

## 2021-12-31 DIAGNOSIS — E1169 Type 2 diabetes mellitus with other specified complication: Secondary | ICD-10-CM

## 2021-12-31 DIAGNOSIS — I10 Essential (primary) hypertension: Secondary | ICD-10-CM

## 2021-12-31 NOTE — Chronic Care Management (AMB) (Signed)
Chronic Care Management   CCM RN Visit Note  12/31/2021 Name: [name] MRN: [id number] DOB: 1946/05/05  Subjective: [name] is a 76 y.o. year old female who is a primary care patient of [name], DO. The care management team was consulted for assistance with disease management and care coordination needs.    Collaboration with Primary care Provider. RNCM received voice message left on 12/30/21 from patient with an update of blood pressure and blood sugar reading.    Consent to Services:  The patient was given information about Chronic Care Management services, agreed to services, and gave verbal consent prior to initiation of services.  Please see initial visit note for detailed documentation.   Patient agreed to services and verbal consent obtained.   Assessment: Review of patient past medical history, allergies, medications, health status, including review of consultants reports, laboratory and other test data, was performed as part of comprehensive evaluation and provision of chronic care management services.   SDOH (Social Determinants of Health) assessments and interventions performed:    CCM Care Plan  Allergies  Allergen Reactions   Oxycodone Itching   Vicodin [Hydrocodone-Acetaminophen] Itching   Latex Itching and Rash   Neomycin-Bacitracin Zn-Polymyx Other (See Comments)    Red , swollen eye   Sulfonamide Derivatives Other (See Comments)    Just had reaction when taking during pregnancy- last child birth 61    Outpatient Encounter Medications as of 12/31/2021  Medication Sig Note   ALPRAZolam (XANAX) 0.5 MG tablet TAKE 1 TABLET BY MOUTH THREE TIMES A DAY AS NEEDED    amoxicillin (AMOXIL) 500 MG tablet TAKE 4 TABLETS BY MOUTH 1 HOUR PRIOR TO DENTAL WORK, INCLUDING CLEANINGS    Ascorbic Acid (VITAMIN C) 1000 MG tablet Take 1,000 mg by mouth daily.    Blood Glucose Calibration (TRUE METRIX LEVEL 1) Low SOLN 1 each by In Vitro route in the morning  and at bedtime.    Blood Glucose Monitoring Suppl (TRUE METRIX METER) DEVI 1 each by Does not apply route in the morning and at bedtime.    carvedilol (COREG) 25 MG tablet TAKE 1 TABLET BY MOUTH TWICE A DAY WITH MEALS    citalopram (CELEXA) 20 MG tablet TAKE 1 TABLET EVERY DAY ALONG WITH 40MG TO EQUAL 60MG TOTAL    citalopram (CELEXA) 40 MG tablet TAKE 1 TABLET EVERY DAY ALONG WITH 20MG TO EQUAL 60MG TOTAL    [facility] ASPIRIN ADULT LOW DOSE 81 MG chewable tablet CHEW 1 TABLET BY MOUTH DAILY.    fluticasone (FLONASE) 50 MCG/ACT nasal spray USE 2 SPRAYS IN [facility] NOSTIRL EVERY DAY 06/26/2021: Reports takes as needed   furosemide (LASIX) 20 MG tablet TAKE 1 TABLET BY MOUTH EVERY DAY 12/29/2021: Reports takes 20 mg every other day   furosemide (LASIX) 40 MG tablet Take 1 tablet (40 mg total) by mouth daily. 12/29/2021: Reports takes as needed   glucose blood (TRUE METRIX BLOOD GLUCOSE TEST) test strip Use as instructed    hyoscyamine (LEVSIN SL) 0.125 MG SL tablet TAKE 1 TABLET BY MOUTH TWICE A DAY AS NEEDED FOR CRAMPING OR DIARRHEA OR LOOSE STOOLS    JARDIANCE 10 MG TABS tablet TAKE 1 TABLET BY MOUTH EVERY DAY    losartan (COZAAR) 25 MG tablet TAKE 1/2 TABLET BY MOUTH AT BEDTIME.    Omega-3 Fatty Acids (FISH OIL) 1000 MG CAPS Take 1,000 mg by mouth daily.     Potassium Chloride ER 20 MEQ TBCR TAKE 1  TABLET BY MOUTH EVERY DAY    Probiotic Product (PRO-BIOTIC BLEND PO) Take 1 tablet by mouth daily.     rosuvastatin (CRESTOR) 20 MG tablet TAKE 1 TABLET BY MOUTH EVERY DAY    Semaglutide,0.25 or 0.5MG/DOS, (OZEMPIC, 0.25 OR 0.5 MG/DOSE,) 2 MG/1.5ML SOPN Inject 0.5 mg into the skin once a week.    TRUEplus Lancets 33G MISC TEST BLOOD SUGAR TWICE DAILY IN THE MORNING AND AT BEDTIME    Facility-Administered Encounter Medications as of 12/31/2021  Medication   diclofenac sodium (VOLTAREN) 1 % transdermal gel 2 g    Patient Active Problem List   Diagnosis Date Noted   Preventative health care 04/30/2021    Hyperlipidemia associated with type 2 diabetes mellitus ([place]) 09/02/2020   Uncontrolled type 2 diabetes mellitus with hyperglycemia ([place]) 09/02/2020   Acute on chronic combined systolic and diastolic CHF (congestive heart failure) ([place]) 01/29/2020   S/P TAVR (transcatheter aortic valve replacement) 01/29/2020   Type 2 diabetes mellitus with complication (HCC)    Severe aortic stenosis    CKD (chronic kidney disease) stage 3, GFR 30-59 ml/min (HCC)    ICD (implantable cardioverter-defibrillator) in place 08/15/2019   Ventricular tachycardia    Anemia 06/27/2019   Severe obesity (BMI 35.0-35.9 with comorbidity) ([place]) 12/08/2018   DM (diabetes mellitus) type II uncontrolled, periph vascular disorder ([place]) 04/24/2015   Paroxysmal atrial flutter ([place]) 10/07/2013   GERD 01/15/2011   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, status post graft repair the ascending aorta 10/24/2007   Hyperlipidemia LDL goal <100 05/23/2007   Anxiety 05/23/2007   Primary hypertension 05/23/2007    Conditions to be addressed/monitored:HTN and DMII  Care Plan : RN Care Manager Plan of Care  Updates made by Luretha Rued, RN since 12/31/2021 12:00 AM     Problem: Chronic Disease Management Education and/or Care Coordination needs   Priority: High     Long-Range Goal: Development of plan of care for Chronic Disease Management   Start Date: 12/29/2021  Expected End Date: 06/28/2022  Priority: High  Note:   Current Barriers: Reports, "I am doing wonderfully well". Blood sugar today was 110. Reports her blood sugars have been ranging 98-110. Denies any hypoglycemic episodes. She expresses concern that there is a waiting list at her local pharmacy to receive Ozempic. She reports current medication/dose received from her PCP office. She states she is following up weekly with her pharmacy to see if the medication has come in. She reports she is check blood pressure, but is  not recording. She reports last blood pressure was taken yesterday, but could not remember exact number estimating her pressure was around 140/60. She continues to remain active and does some type of activity every day such as walking in the house, housework, shopping. Expressing no specific health concerns at this time. Knowledge Deficits related to plan of care for management of CHF, HTN, HLD, and DMII  Chronic Disease Management support and education needs related to CHF, HTN, HLD, and DMII  RNCM Clinical Goal(s):  Patient will verbalize understanding of plan for management of CHF, HTN, HLD, and DMII as evidenced by self report and/or chart notation demonstrate ongoing health management independence as evidenced by attending provider visits as scheduled, taking medications as prescribed, following up with provider with questions or concerns as needed        through collaboration with RN Care manager, provider, and care team.   Interventions: 1:1 collaboration with  primary care provider regarding development and update of comprehensive plan of care as evidenced by provider attestation and co-signature Inter-disciplinary care team collaboration (see longitudinal plan of care) Evaluation of current treatment plan related to  self management and patient's adherence to plan as established by provider   Diabetes Interventions:  (Status:  Goal on track:  Yes.) Long Term Goal Assessed patient's understanding of A1c goal: <7% Encouraged to continue to check blood sugar per provider recommendation Upcoming appointments reviewed Encouraged to continue to take medications as prescribed Update: RNCM received voice message (left on 12/30/21) from Mrs. Fuston stating she wanted to give RNCM "good news" that her morning blood sugar was 99. PCP updated Lab Results  Component Value Date   HGBA1C 7.1 (H) 04/30/2021   Hyperlipidemia Interventions:  (Status:  New goal.) Long Term Goal Medication review  performed; medication list updated in electronic medical record.  Counseled on importance of regular laboratory monitoring as prescribed Provided HLD educational materials  Hypertension/CHF  Interventions:  (Status:  New goal.) Long Term Goal Update: RNCM received voice message (left on 12/30/21) from Mrs. Corrie stating she wanted to give RNCM "good news" that her Blood Pressure was 125/65. PCP updated Last practice recorded BP readings:  BP Readings from Last 3 Encounters:  04/30/21 126/84  03/02/21 130/78  01/28/21 120/80  Most recent eGFR/CrCl:  Lab Results  Component Value Date   EGFR 49 (L) 03/02/2021    No components found for: CRCL  Reviewed medications with patient and discussed importance of compliance Advised patient to discuss attend scheduled visits with provider and contact with any questions or concerns Discussed signs/symptoms of Heart failure exacerbation Encouraged to routinely take and record blood pressure and take to provider office visits  Patient Goals/Self-Care Activities: Take medications as prescribed   Attend all scheduled provider appointments use salt in moderation manage portion size Continue to eat healthy: lean meats, fruits, vegetables, avoid foods that are high in saturated fats and transfats, avoid processed foods. Continue to remain active Begin to record blood pressure readings. Ask you provider what is your Target Blood pressure range   Plan: Telephone follow up appointment with care management team member as previously scheduled for:  02/23/22  Thea Silversmith, RN, MSN, BSN, CCM Care Management Coordinator [facility] [phone]

## 2022-01-01 ENCOUNTER — Encounter: Payer: Self-pay | Admitting: Family Medicine

## 2022-01-01 ENCOUNTER — Telehealth (INDEPENDENT_AMBULATORY_CARE_PROVIDER_SITE_OTHER): Payer: Medicare HMO | Admitting: Family Medicine

## 2022-01-01 DIAGNOSIS — U071 COVID-19: Secondary | ICD-10-CM

## 2022-01-01 MED ORDER — MOLNUPIRAVIR EUA 200MG CAPSULE: 4.0000 | ORAL_CAPSULE | Freq: Two times a day (BID) | ORAL | 0 refills | Status: AC

## 2022-01-01 NOTE — Progress Notes (Signed)
Virtual Video Visit via MyChart Note converted to telephone call  I connected with  [name] on 01/01/22 at 11:00 AM EST by the video enabled telemedicine application for MyChart, and verified that I am speaking with the correct person using two identifiers.   I introduced myself as a Designer, jewellery with the practice. We discussed the limitations of evaluation and management by telemedicine and the availability of in person appointments. The patient expressed understanding and agreed to proceed.  Participating parties in this visit include: The patient and the nurse practitioner listed.  The patient is: At home I am: In the office - [facility]  Subjective:    CC:  Chief Complaint  Patient presents with   Covid Positive    Test positive yesterday afternoon    HPI: [name] is a 76 y.o. year old female presenting today via [place] today for + COVID.   Patient reports she started feeling poorly yesterday afternoon and had a positive COVID test last night.  She has been having headaches, fever, body aches, sore throat, fatigue, poor appetite, ear pain.  She denies any cough, dyspnea, nausea, vomiting, diarrhea, GU symptoms, chest pain, loss of taste or smell.  States she has been trying to drink a lot of fluids and has been getting some relief with Tylenol.  She is interested in antiviral therapy.     Past medical history, Surgical history, Family history not pertinant except as noted below, Social history, Allergies, and medications have been entered into the medical record, reviewed, and corrections made.   Review of Systems:  All review of systems negative except what is listed in the HPI   Objective:    General:  Speaking clearly in complete sentences. Absent shortness of breath noted.   Alert and oriented x3.   Normal judgment.  Absent acute distress.   Impression and Recommendations:    1. COVID-19 - molnupiravir EUA  (LAGEVRIO) 200 mg CAPS capsule; Take 4 capsules (800 mg total) by mouth 2 (two) times daily for 5 days.  Dispense: 40 capsule; Refill: 0   Molnupiravir sent in with link attached to AVS along with over-the-counter medications and quarantine guidelines. Continue supportive measures including rest, hydration, humidifier use, steam showers, warm compresses to sinuses, warm liquids with lemon and honey, and over-the-counter cough, cold, and analgesics as needed. Patient aware of signs/symptoms requiring further/urgent evaluation.   Follow-up if symptoms worsen or fail to improve.    I discussed the assessment and treatment plan with the patient. The patient was provided an opportunity to ask questions and all were answered. The patient agreed with the plan and demonstrated an understanding of the instructions.   The patient was advised to call back or seek an in-person evaluation if the symptoms worsen or if the condition fails to improve as anticipated.  I spent 20 minutes dedicated to the care of this patient on the date of this encounter to include pre-visit chart review of prior notes and results, face-to-face time with the patient, and post-visit ordering of testing as indicated.   Terrilyn Saver, NP

## 2022-01-01 NOTE — Telephone Encounter (Signed)
Spoke with patient. Pt has virtual scheduled this morning.

## 2022-01-01 NOTE — Patient Instructions (Addendum)
Molnupiravir patient fact sheet: RunningShows.fr    Over the counter medications that may be helpful for symptoms:  Guaifenesin 1200 mg extended release tabs twice daily, with plenty of water For cough and congestion Brand name: Mucinex   Pseudoephedrine 30 mg, one or two tabs every 4 to 6 hours For sinus congestion Brand name: Sudafed You must get this from the pharmacy counter.  Oxymetazoline nasal spray each morning, one spray in each nostril, for NO MORE THAN 3 days  For nasal and sinus congestion Brand name: Afrin Saline nasal spray or Saline Nasal Irrigation 3-5 times a day For nasal and sinus congestion Brand names: Ocean or AYR Fluticasone nasal spray, one spray in each nostril, each morning (after oxymetazoline and saline, if used) For nasal and sinus congestion Brand name: Flonase Warm salt water gargles  For sore throat Every few hours as needed Alternate ibuprofen 400-600 mg and acetaminophen 1000 mg every 4-6 hours For fever, body aches, headache Brand names: Motrin or Advil and Tylenol Dextromethorphan 12-hour cough version 30 mg every 12 hours  For cough Brand name: Delsym Stop all other cold medications for now (Nyquil, Dayquil, Tylenol Cold, Theraflu, etc) and other non-prescription cough/cold preparations. Many of these have the same ingredients listed above and could cause an overdose of medication.   Herbal treatments that have been shown to be helpful in some patients include: Vitamin C 1000mg  per day Vitamin D 4000iU per day Zinc 100mg  per day Quercetin 25-500mg  twice a day Melatonin 5-10mg  at bedtime  General Instructions Allow your body to rest Drink PLENTY of fluids Isolate yourself from everyone, even family, until test results have returned  If your COVID-19 test is positive Then you ARE INFECTED and you can pass the virus to others You must quarantine from others for a minimum of  5 days since symptoms started  AND You are fever free for 24 hours WITHOUT any medication to reduce fever AND Your symptoms are improving Wear mask for the next 5 days If not improved by day 5, quarantine for the full 10 days. Do not go to the store or other public areas Do not go around household members who are not known to be infected with COVID-19 If you MUST leave your area of quarantine (example: go to a bathroom you share with others in your home), you must Wear a mask Wash your hands thoroughly Wipe down any surfaces you touch Do not share food, drinks, towels, or other items with other persons Dispose of your own tissues, food containers, etc  Once you have recovered, please continue good preventive care measures, including:  wearing a mask when in public wash your hands frequently avoid touching your face/nose/eyes cover coughs/sneezes with the inside of your elbow stay out of crowds keep a 6 foot distance from others  If you develop severe shortness of breath, uncontrolled fevers, coughing up blood, confusion, chest pain, or signs of dehydration (such as significantly decreased urine amounts or dizziness with standing) please go to the ER.

## 2022-01-05 ENCOUNTER — Ambulatory Visit (INDEPENDENT_AMBULATORY_CARE_PROVIDER_SITE_OTHER): Payer: Medicare HMO | Admitting: Pharmacist

## 2022-01-05 DIAGNOSIS — E1169 Type 2 diabetes mellitus with other specified complication: Secondary | ICD-10-CM

## 2022-01-05 DIAGNOSIS — E785 Hyperlipidemia, unspecified: Secondary | ICD-10-CM

## 2022-01-05 DIAGNOSIS — I10 Essential (primary) hypertension: Secondary | ICD-10-CM

## 2022-01-05 DIAGNOSIS — E1165 Type 2 diabetes mellitus with hyperglycemia: Secondary | ICD-10-CM

## 2022-01-05 NOTE — Patient Instructions (Signed)
[name] It was a pleasure speaking with you today.  I have attached a summary of our visit today and information about your health goals.   If you have any questions or concerns, please feel free to contact me either at the phone number below or with a MyChart message.   Keep up the good work!  [name], PharmD Clinical Pharmacist [facility] [phone] (direct line)  [phone] (main office number)  Chronic Care Management Care Plan (updated 01/05/2022)  Diabetes / Weight Management (A1c goal <7.0): Last A1c was 7.1% Current treatment: Jardiance 10mg  daily Ozempic 0.25mg  weekly for 4 weeks, then 0.5mg  thereafter (started 05/05/2021)  Interventions:  Reviewed home blood glucose readings and reviewed goals  Fasting blood glucose goal (before meals) = 80 to 130 Blood glucose goal after a meal = less than 180  Continue to limit sugar and carbohydrates (goal is 45 grams of carbohydrates per meal of less)  Continue to exercise with goal of at least 150 minutes per week. Reminded to get yearly diabetic eye exam  Hypertension / Heart Controlled; Goal - BP <130/80 and to prevent CHF exacerbations and manage symptoms Current treatment: Carvedilol 25mg  twice a day Losartan 25mg  - take 0.5 tablet = 12.5mg  daily Furosemide 20mg  or 40mg  - take 1 tablet daily depending on weight and swelling Potassium chloride 20 mEq - take 1 tablet daily  Jardiance 10mg  daily  Interventions:  Continue to check weight daily and report increases of more than 3lbs in 24 hours or 5lbs in 1 week Reviewed each medication above and benefits for heart health Continue to limit sodium intake to <2300 mg per day Continue current therapy for blood pressure and heart  Hyperlipidemia: Controlled; LDL goal  <100 (last LDL was 83) current treatment: Rosuvastatin 20mg  daily  Omega 3 Fatty Acids - 1000mg  daily Interventions:  Reviewed LDL goal Continue current  therapy for lipids / heart health  Anxiety Current treatment: Citalopram 20mg  and 40mg  tablet - take 1 tablet of each to equal total daily dose of 60mg   Alprazolam 0.5mg  up to 3 times a day if needed for anxiety  Interventions:  Recommend continue current therapy for anxiety    Patient Goals/Self-Care Activities Over the next 90 days, patient will:  take medications as prescribed,  check glucose 1 time a day, document, and provide at future appointments,  check blood pressure 1 to 2 times per week, document, and provide at future appointments,  weigh daily, and contact provider if weight gain of more than 3 lbs in 24 hours or 5 lbs in 1 week,  collaborate with provider on medication access solutions when needed. Contact clinical pharmacist if you need assistance in future with Ozempic supply issues. target a minimum of 150 minutes of moderate intensity exercise weekly   Follow Up Plan:   Pharmacy team will check in with patient again by phone in next 90 days  Patient verbalizes understanding of instructions and care plan provided today and agrees to view in [facility]. Active MyChart status confirmed with patient.

## 2022-01-05 NOTE — Chronic Care Management (AMB) (Signed)
Chronic Care Management Pharmacy Note  01/05/2022 Name:  [name] MRN:  [id number] DOB:  [birth year]  Summary: BG and weight continue to improve after addition of Ozempic. Currently taking Ozempic 0.41m weekly. Patient has had difficulty getting Ozempic from pharmacy. She has received sample from office and has 2 doses left. She is anticipating getting refill for Ozempic 01/08/2022. She is to notify clinical pharmacy if any future issues with supply of OProwersblood pressure has been at goal.   Recommendations/Changes made from today's visit: Recommended she continue Ozempic at current dose of 0.564mweekly.  Reminded to get yearly eye exam.  Continue current blood pressure medications  Subjective: Anna LATULIPPEs an [age].o. year old female who is a primary patient of LoAnn HeldDO.  The CCM team was consulted for assistance with disease management and care coordination needs.    Engaged with patient by telephone for follow up visit in response to provider referral for pharmacy case management and/or care coordination services.   Consent to Services:  The patient was given information about Chronic Care Management services, agreed to services, and gave verbal consent prior to initiation of services.  Please see initial visit note for detailed documentation.   Patient Care Team: [name] ApaDO as PCP - General (Family Medicine) Croitoru, MiDani GobbleMD as PCP - Cardiology (Cardiology) WaLuretha RuedRN as Case Manager BaCyndia BentBrFernande BoydenMD as Consulting Physician (Cardiothoracic Surgery) TaRoyston CowperDDS (Dentistry) TaMarygrace DroughtMD as Consulting Physician (Ophthalmology) EcCherre RobinsRPH-CPP (Pharmacist)  Recent office visits: 01/01/2022 - PCP (Dr LoCarollee HerterVideo Visit. Home test positive for COVID. Having symptoms - fatigue, sore throat, body aches and headaches. Prescribed molnupiravir 8001mwice daily for 5 days. Also recommended OTC  therapy - guaifenesin, pseutoephedrine, oxymetazoline, saline  nasal spray, fluticasone nasal sprya, warm salt gargles and pain relievers if needed for symptoms.  04/30/2021 - PCP (Dr LowEtter SjogrenPE. restarted hyoscamine as needed for abdominal pain  Recent consult visits: 03/02/2021 - Cardio (Dr CroSallyanne Kuster/U CHF, HTN, TVAR, dysrythmias. No med changes.  01/28/2021 - [place] S/p TVAR. No med changes.   Hospital visits: None in previous 6 months  Objective:  Lab Results  Component Value Date   CREATININE 1.38 (H) 04/30/2021   CREATININE 1.16 (H) 03/02/2021   CREATININE 1.14 (H) 09/02/2020    Lab Results  Component Value Date   HGBA1C 7.1 (H) 04/30/2021   Last diabetic Eye exam:  Lab Results  Component Value Date/Time   HMDIABEYEEXA No Retinopathy 10/08/2015 12:00 AM    Last diabetic Foot exam: No results found for: HMDIABFOOTEX      Component Value Date/Time   CHOL 170 04/30/2021 1032   TRIG 165.0 (H) 04/30/2021 1032   HDL 53.90 04/30/2021 1032   CHOLHDL 3 04/30/2021 1032   VLDL 33.0 04/30/2021 1032   LDLCALC 83 04/30/2021 1032   LDLCALC 65 09/02/2020 1134   LDLDIRECT 217.7 09/13/2011 1535    Hepatic Function Latest Ref Rng & Units 04/30/2021 09/02/2020 01/25/2020  Total Protein 6.0 - 8.3 g/dL 6.9 7.4 7.5  Albumin 3.5 - 5.2 g/dL 4.2 - 3.2(L)  AST 0 - 37 U/L 14 17 23   ALT 0 - 35 U/L 11 15 15   Alk Phosphatase 39 - 117 U/L 68 - 85  Total Bilirubin 0.2 - 1.2 mg/dL 0.4 0.3 0.6  Bilirubin, Direct 0.0 - 0.3 mg/dL - - -    Lab Results  Component  Value Date/Time   TSH 0.893 06/28/2019 02:13 AM   TSH 0.986 08/22/2013 04:10 PM   TSH 0.96 12/09/2010 10:31 AM    CBC Latest Ref Rng & Units 04/30/2021 02/01/2020 01/31/2020  WBC 4.0 - 10.5 K/uL 5.1 4.0 4.2  Hemoglobin 12.0 - 15.0 g/dL 11.9(L) 9.6(L) 10.2(L)  Hematocrit 36.0 - 46.0 % 35.2(L) 29.5(L) 31.2(L)  Platelets 150.0 - 400.0 K/uL 120.0(L) 92(L) 88(L)    Lab Results  Component Value Date/Time   VD25OH 13 (L)  12/09/2010 08:16 PM    Clinical ASCVD: No  The 10-year ASCVD risk score (Arnett DK, et al., 2019) is: 35.4%   Values used to calculate the score:     Age: 76 years     Sex: Female     Is Non-Hispanic African American: No     Diabetic: Yes     Tobacco smoker: No     Systolic Blood Pressure: 376 mmHg     Is BP treated: Yes     HDL Cholesterol: 53.9 mg/dL     Total Cholesterol: 170 mg/dL      Social History   Tobacco Use  Smoking Status Former   Types: Cigarettes   Quit date: 09/04/1993   Years since quitting: 28.3  Smokeless Tobacco Never   BP Readings from Last 3 Encounters:  04/30/21 126/84  03/02/21 130/78  01/28/21 120/80   Pulse Readings from Last 3 Encounters:  04/30/21 80  03/02/21 64  01/28/21 75   Wt Readings from Last 3 Encounters:  11/02/21 180 lb (81.6 kg)  04/30/21 189 lb 12.8 oz (86.1 kg)  03/02/21 185 lb (83.9 kg)    Assessment: Review of patient past medical history, allergies, medications, health status, including review of consultants reports, laboratory and other test data, was performed as part of comprehensive evaluation and provision of chronic care management services.   SDOH:  (Social Determinants of Health) assessments and interventions performed:      CCM Care Plan  Allergies  Allergen Reactions   Oxycodone Itching   Vicodin [Hydrocodone-Acetaminophen] Itching   Latex Itching and Rash   Neomycin-Bacitracin Zn-Polymyx Other (See Comments)    Red , swollen eye   Sulfonamide Derivatives Other (See Comments)    Just had reaction when taking during pregnancy- last child birth 26    Medications Reviewed Today     Reviewed by Terrilyn Saver, NP (Nurse Practitioner) on 01/01/22 at 1228  Med List Status: <None>   Medication Order Taking? Sig Documenting Provider Last Dose Status Informant  ALPRAZolam (XANAX) 0.5 MG tablet [id number] No TAKE 1 TABLET BY MOUTH THREE TIMES A DAY AS NEEDED Carollee Herter, Alferd Apa, DO Taking Active    amoxicillin (AMOXIL) 500 MG tablet [id number] No TAKE 4 TABLETS BY MOUTH 1 HOUR PRIOR TO DENTAL WORK, INCLUDING CLEANINGS Sherren Mocha, MD Taking Active   Ascorbic Acid (VITAMIN C) 1000 MG tablet [id number] No Take 1,000 mg by mouth daily. [provider] Taking Active Self  Blood Glucose Calibration (TRUE METRIX LEVEL 1) Low SOLN [id number] No 1 each by In Vitro route in the morning and at bedtime. Roma Schanz R, DO Taking Active   Blood Glucose Monitoring Suppl (TRUE METRIX METER) DEVI [id number] No 1 each by Does not apply route in the morning and at bedtime. Roma Schanz R, DO Taking Active   carvedilol (COREG) 25 MG tablet [id number] No TAKE 1 TABLET BY MOUTH TWICE A DAY WITH MEALS Croitoru, Mihai, MD Taking Active   citalopram (CELEXA)  20 MG tablet [id number] No TAKE 1 TABLET EVERY DAY ALONG WITH 40MG TO EQUAL 60MG TOTAL [name], DO Taking Active   citalopram (CELEXA) 40 MG tablet [id number] No TAKE 1 TABLET EVERY DAY ALONG WITH 20MG TO EQUAL 60MG TOTAL Roma Schanz R, DO Taking Active   [facility] ASPIRIN ADULT LOW DOSE 81 MG chewable tablet [id number] No CHEW 1 TABLET BY MOUTH DAILY. Croitoru, Mihai, MD Taking Active   diclofenac sodium (VOLTAREN) 1 % transdermal gel 2 g [id number]   Carollee Herter, Alferd Apa, DO  Active   fluticasone [facility]) 50 MCG/ACT nasal spray [id number] No USE 2 SPRAYS IN [facility] EVERY DAY [name], DO Taking Active Self           Med Note Juleen China, Deno Etienne   Fri Jun 26, 2021  3:52 PM) Reports takes as needed  furosemide (LASIX) 20 MG tablet [id number] No TAKE 1 TABLET BY MOUTH EVERY DAY Croitoru, Mihai, MD Taking Active            Med Note Juleen China, Deno Etienne   Tue Dec 29, 2021  2:02 PM) Reports takes 20 mg every other day  furosemide (LASIX) 40 MG tablet [id number] No Take 1 tablet (40 mg total) by mouth daily. Croitoru, Mihai, MD Taking Active            Med Note Juleen China, Deno Etienne   Tue Dec 29, 2021  2:02 PM) Reports  takes as needed  glucose blood (TRUE METRIX BLOOD GLUCOSE TEST) test strip [id number] No Use as instructed Carollee Herter, Alferd Apa, DO Taking Active   hyoscyamine (LEVSIN SL) 0.125 MG SL tablet [id number] No TAKE 1 TABLET BY MOUTH TWICE A DAY AS NEEDED FOR CRAMPING OR DIARRHEA OR LOOSE STOOLS Lowne Chase, Yvonne R, DO Taking Active   JARDIANCE 10 MG TABS tablet [id number] No TAKE 1 TABLET BY MOUTH EVERY DAY Croitoru, Mihai, MD Taking Active   losartan (COZAAR) 25 MG tablet [id number] No TAKE 1/2 TABLET BY MOUTH AT BEDTIME. Croitoru, Mihai, MD Taking Active   molnupiravir EUA (LAGEVRIO) 200 mg CAPS capsule [id number] Yes Take 4 capsules (800 mg total) by mouth 2 (two) times daily for 5 days. Terrilyn Saver, NP  Active   Omega-3 Fatty Acids (FISH OIL) 1000 MG CAPS [id number] No Take 1,000 mg by mouth daily.  [provider] Taking Active Self  Potassium Chloride ER 20 MEQ TBCR [id number] No TAKE 1 TABLET BY MOUTH EVERY DAY Croitoru, Mihai, MD Taking Active   Probiotic Product (PRO-BIOTIC BLEND PO) [id number] No Take 1 tablet by mouth daily.  [provider] Taking Active Self  rosuvastatin (CRESTOR) 20 MG tablet [id number] No TAKE 1 TABLET BY MOUTH EVERY DAY Carollee Herter, Alferd Apa, DO Taking Active   Semaglutide,0.25 or 0.5MG/DOS, (OZEMPIC, 0.25 OR 0.5 MG/DOSE,) 2 MG/1.5ML SOPN [id number] No Inject 0.5 mg into the skin once a week. [name], DO Taking Active   TRUEplus Lancets 33G MISC [id number] No TEST BLOOD SUGAR TWICE DAILY IN THE MORNING AND AT BEDTIME [name], DO Taking Active             Patient Active Problem List   Diagnosis Date Noted   Preventative health care 04/30/2021   Hyperlipidemia associated with type 2 diabetes mellitus ([place]) 09/02/2020   Uncontrolled type 2 diabetes mellitus with hyperglycemia ([place]) 09/02/2020   Acute on chronic combined systolic and diastolic CHF (congestive heart failure) ([place]) 01/29/2020   S/P  TAVR (transcatheter aortic  valve replacement) 01/29/2020   Type 2 diabetes mellitus with complication (HCC)    Severe aortic stenosis    CKD (chronic kidney disease) stage 3, GFR 30-59 ml/min (HCC)    ICD (implantable cardioverter-defibrillator) in place 08/15/2019   Ventricular tachycardia    Anemia 06/27/2019   Severe obesity (BMI 35.0-35.9 with comorbidity) (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, status post graft repair the ascending aorta 10/24/2007   Hyperlipidemia LDL goal <100 05/23/2007   Anxiety 05/23/2007   Primary hypertension 05/23/2007    Immunization History  Administered Date(s) Administered   Fluad Quad(high Dose 65+) 11/12/2019, 09/02/2020, 09/21/2021   Influenza Split 09/13/2011   Influenza Whole 09/19/2008, 09/09/2009, 10/15/2010   Influenza, High Dose Seasonal PF 10/14/2015   Influenza,inj,Quad PF,6+ Mos 10/11/2013, 08/23/2014   Influenza-Unspecified 09/10/2016   Moderna Covid-19 Vaccine Bivalent Booster 42yr & up 09/21/2021   Moderna SARS-COV2 Booster Vaccination 04/30/2021   Moderna Sars-Covid-2 Vaccination 01/15/2020, 02/12/2020   PFIZER(Purple Top)SARS-COV-2 Vaccination 09/29/2020   Pneumococcal Conjugate-13 10/14/2015   Pneumococcal Polysaccharide-23 01/09/2013   Td 09/01/2006   Zoster, Live 12/09/2010    Conditions to be addressed/monitored: CHF, HTN, HLD, DMII, CKD Stage 3, Anxiety, and IBS  There are no care plans that you recently modified to display for this patient.    Medication Assistance: Application for Ozempic medication assistance program mailed to patient in 2022 when she was in Medicare coverage gap but she decided not to apply for medication assistance. Assessed for MAP for Jardiance and FWilder Glade(patient income too high for [place] and [place] program or  BI cares program) in 2022.  Will continue to follow in 2023.   Patient[facility] pharmacy [facility]  [id number] [address] [place]: [phone]: [phone] [place]: [phone]: [id number] [address] [address]: [phone]: [phone] Follow Up:  Patient agrees to Care Plan and Follow-up.  Plan: The care management team will reach out to the patient again over the next 90 days.   TamCherre RobinsharmD Clinical Pharmacist [phone]

## 2022-01-07 ENCOUNTER — Other Ambulatory Visit: Payer: Self-pay | Admitting: Family Medicine

## 2022-01-07 DIAGNOSIS — F419 Anxiety disorder, unspecified: Secondary | ICD-10-CM

## 2022-01-07 NOTE — Telephone Encounter (Signed)
[place] Pt.   Requesting: alprazolam 0.5mg   Contract: 09/02/2020 UDS: 09/02/2020 Last Visit: 04/30/2021 Next Visit: 02/02/2022 Last Refill: 12/10/2021 #90 and 0RF  Please Advise

## 2022-01-21 ENCOUNTER — Other Ambulatory Visit: Payer: Self-pay | Admitting: Family Medicine

## 2022-01-21 DIAGNOSIS — K580 Irritable bowel syndrome with diarrhea: Secondary | ICD-10-CM

## 2022-01-26 DIAGNOSIS — Z7984 Long term (current) use of oral hypoglycemic drugs: Secondary | ICD-10-CM

## 2022-01-26 DIAGNOSIS — I509 Heart failure, unspecified: Secondary | ICD-10-CM

## 2022-01-26 DIAGNOSIS — E785 Hyperlipidemia, unspecified: Secondary | ICD-10-CM

## 2022-01-26 DIAGNOSIS — I110 Hypertensive heart disease with heart failure: Secondary | ICD-10-CM

## 2022-01-26 DIAGNOSIS — E1159 Type 2 diabetes mellitus with other circulatory complications: Secondary | ICD-10-CM

## 2022-02-02 ENCOUNTER — Ambulatory Visit (INDEPENDENT_AMBULATORY_CARE_PROVIDER_SITE_OTHER): Payer: Medicare HMO | Admitting: Family Medicine

## 2022-02-02 ENCOUNTER — Encounter: Payer: Self-pay | Admitting: Family Medicine

## 2022-02-02 ENCOUNTER — Other Ambulatory Visit (HOSPITAL_BASED_OUTPATIENT_CLINIC_OR_DEPARTMENT_OTHER): Payer: Self-pay

## 2022-02-02 VITALS — BP 128/70 | HR 86 | Temp 98.1°F | Resp 18 | Ht 61.0 in | Wt 179.2 lb

## 2022-02-02 DIAGNOSIS — E1165 Type 2 diabetes mellitus with hyperglycemia: Secondary | ICD-10-CM | POA: Diagnosis not present

## 2022-02-02 DIAGNOSIS — E1169 Type 2 diabetes mellitus with other specified complication: Secondary | ICD-10-CM | POA: Diagnosis not present

## 2022-02-02 DIAGNOSIS — I495 Sick sinus syndrome: Secondary | ICD-10-CM

## 2022-02-02 DIAGNOSIS — E278 Other specified disorders of adrenal gland: Secondary | ICD-10-CM

## 2022-02-02 DIAGNOSIS — I5043 Acute on chronic combined systolic (congestive) and diastolic (congestive) heart failure: Secondary | ICD-10-CM | POA: Diagnosis not present

## 2022-02-02 DIAGNOSIS — Z0000 Encounter for general adult medical examination without abnormal findings: Secondary | ICD-10-CM

## 2022-02-02 DIAGNOSIS — I10 Essential (primary) hypertension: Secondary | ICD-10-CM | POA: Diagnosis not present

## 2022-02-02 DIAGNOSIS — F419 Anxiety disorder, unspecified: Secondary | ICD-10-CM

## 2022-02-02 DIAGNOSIS — M3390 Dermatopolymyositis, unspecified, organ involvement unspecified: Secondary | ICD-10-CM | POA: Diagnosis not present

## 2022-02-02 DIAGNOSIS — N1832 Chronic kidney disease, stage 3b: Secondary | ICD-10-CM

## 2022-02-02 DIAGNOSIS — I71019 Dissection of thoracic aorta, unspecified: Secondary | ICD-10-CM

## 2022-02-02 DIAGNOSIS — E279 Disorder of adrenal gland, unspecified: Secondary | ICD-10-CM

## 2022-02-02 DIAGNOSIS — Z95810 Presence of automatic (implantable) cardiac defibrillator: Secondary | ICD-10-CM

## 2022-02-02 DIAGNOSIS — M3313 Other dermatomyositis without myopathy: Secondary | ICD-10-CM

## 2022-02-02 DIAGNOSIS — Z6835 Body mass index (BMI) 35.0-35.9, adult: Secondary | ICD-10-CM

## 2022-02-02 DIAGNOSIS — Z952 Presence of prosthetic heart valve: Secondary | ICD-10-CM

## 2022-02-02 DIAGNOSIS — E785 Hyperlipidemia, unspecified: Secondary | ICD-10-CM | POA: Diagnosis not present

## 2022-02-02 LAB — LIPID PANEL
Cholesterol: 161 mg/dL (ref 0–200)
HDL: 53.4 mg/dL (ref >39.00)
LDL Cholesterol: 78 mg/dL (ref 0–99)
NonHDL: 108.01
Total CHOL/HDL Ratio: 3
Triglycerides: 150 mg/dL — ABNORMAL HIGH (ref 0.0–149.0)
VLDL: 30 mg/dL (ref 0.0–40.0)

## 2022-02-02 LAB — COMPREHENSIVE METABOLIC PANEL
ALT: 10 U/L (ref 0–35)
AST: 15 U/L (ref 0–37)
Albumin: 4.5 g/dL (ref 3.5–5.2)
Alkaline Phosphatase: 53 U/L (ref 39–117)
BUN: 34 mg/dL — ABNORMAL HIGH (ref 6–23)
CO2: 30 mEq/L (ref 19–32)
Calcium: 10.3 mg/dL (ref 8.4–10.5)
Chloride: 101 mEq/L (ref 96–112)
Creatinine, Ser: 1.35 mg/dL — ABNORMAL HIGH (ref 0.40–1.20)
GFR: 38.49 mL/min — ABNORMAL LOW (ref >60.00)
Glucose, Bld: 104 mg/dL — ABNORMAL HIGH (ref 70–99)
Potassium: 4.6 mEq/L (ref 3.5–5.1)
Sodium: 139 mEq/L (ref 135–145)
Total Bilirubin: 0.7 mg/dL (ref 0.2–1.2)
Total Protein: 7.1 g/dL (ref 6.0–8.3)

## 2022-02-02 LAB — CBC WITH DIFFERENTIAL/PLATELET
Basophils Absolute: 0 10*3/uL (ref 0.0–0.1)
Basophils Relative: 0.3 % (ref 0.0–3.0)
Eosinophils Absolute: 0.1 10*3/uL (ref 0.0–0.7)
Eosinophils Relative: 1.1 % (ref 0.0–5.0)
HCT: 36.3 % (ref 36.0–46.0)
Hemoglobin: 12.4 g/dL (ref 12.0–15.0)
Lymphocytes Relative: 15 % (ref 12.0–46.0)
Lymphs Abs: 0.9 10*3/uL (ref 0.7–4.0)
MCHC: 34.2 g/dL (ref 30.0–36.0)
MCV: 101.5 fl — ABNORMAL HIGH (ref 78.0–100.0)
Monocytes Absolute: 0.4 10*3/uL (ref 0.1–1.0)
Monocytes Relative: 7 % (ref 3.0–12.0)
Neutro Abs: 4.6 10*3/uL (ref 1.4–7.7)
Neutrophils Relative %: 76.6 % (ref 43.0–77.0)
Platelets: 131 10*3/uL — ABNORMAL LOW (ref 150.0–400.0)
RBC: 3.58 Mil/uL — ABNORMAL LOW (ref 3.87–5.11)
RDW: 13.5 % (ref 11.5–15.5)
WBC: 6 10*3/uL (ref 4.0–10.5)

## 2022-02-02 LAB — HEMOGLOBIN A1C: Hgb A1c MFr Bld: 6 % (ref 4.6–6.5)

## 2022-02-02 LAB — MICROALBUMIN / CREATININE URINE RATIO
Creatinine,U: 93.9 mg/dL
Microalb Creat Ratio: 1.4 mg/g (ref 0.0–30.0)
Microalb, Ur: 1.3 mg/dL (ref 0.0–1.9)

## 2022-02-02 MED ORDER — SHINGRIX 50 MCG/0.5ML IM SUSR
INTRAMUSCULAR | 0 refills | Status: DC
Start: 2022-02-02 — End: 2022-10-12
  Filled 2022-02-02: qty 1, 1d supply, fill #0

## 2022-02-02 MED ORDER — BOOSTRIX 5-2.5-18.5 LF-MCG/0.5 IM SUSY
PREFILLED_SYRINGE | INTRAMUSCULAR | 0 refills | Status: DC
Start: 2022-02-02 — End: 2022-10-12
  Filled 2022-02-02: qty 0.5, 1d supply, fill #0

## 2022-02-02 NOTE — Patient Instructions (Signed)

## 2022-02-02 NOTE — Assessment & Plan Note (Signed)
Well controlled, no changes to meds. Encouraged heart healthy diet such as the DASH diet and exercise as tolerated.  °

## 2022-02-02 NOTE — Progress Notes (Signed)
Established Patient Office Visit  Subjective:  Patient ID: [name], female    DOB: 09-22-1946  Age: 76 y.o. MRN: [id number]  CC:  Chief Complaint  Patient presents with   Diabetes   Hypertension   Hyperlipidemia   Follow-up    HPI [name] presents for f/u dm, chol and bp. She has lost some weight and is trying to lose more.    HYPERTENSION  Blood pressure range-not checking   Chest pain- no      Dyspnea- no Lightheadedness- no   Edema- no Other side effects - no   Medication compliance: good Low salt diet- yes  DIABETES  Blood Sugar ranges-good per pt  99--165  Polyuria- no New Visual problems- no Hypoglycemic symptoms- no Other side effects-no Medication compliance - good Last eye exam- due -- app coming up in May Foot exam- today  HYPERLIPIDEMIA  Medication compliance- good RUQ pain- no  Muscle aches- no Other side effects-no    Past Medical History:  Diagnosis Date   Acute thoracic aortic dissection 2008   emergency surgery - [facility]   AICD (automatic cardioverter/defibrillator) present    Anxiety    Cancer (HCC)    skin   CKD (chronic kidney disease) stage 3, GFR 30-59 ml/min (HCC)    Diabetes mellitus    Diverticulosis    GERD (gastroesophageal reflux disease)    Hemorrhoids    History of kidney stones    Hyperlipidemia    Hypertension    Kidney stones    S/P TAVR (transcatheter aortic valve replacement) 01/29/2020   s/p TAVR with a 23 mm Edwards Sapien 3 Ultra via the TA approach wtih Dr. Excell Seltzer and Dr. Laneta Simmers   Severe aortic stenosis    Sinus node dysfunction (HCC)    hx of PPM   Sustained ventricular tachycardia    s/p ICD 05/2019    Past Surgical History:  Procedure Laterality Date   ABDOMINAL HYSTERECTOMY  1984   APPENDECTOMY     EYE SURGERY  2014   Rght eye   ICD IMPLANT N/A 06/28/2019   Procedure: PPM upgrade to ICD;  Surgeon: Marinus Maw, MD;  Location: [facility] INVASIVE CV LAB;  Service: Cardiovascular;   Laterality: N/A;   PACEMAKER GENERATOR CHANGE  2014   Medtronic adapta   PACEMAKER GENERATOR CHANGE N/A 08/29/2013   Procedure: PACEMAKER GENERATOR CHANGE;  Surgeon: Thurmon Fair, MD;  Location: [facility] CATH LAB;  Service: Cardiovascular;  Laterality: N/A;   PACEMAKER INSERTION  08/2006   dual chamber Medtronic EnRhythm; r/t sinus node dysfunction    REPAIR OF ACUTE ASCENDING THORACIC AORTIC DISSECTION  2008   Dr. Tyrone Sage   RIGHT/LEFT HEART CATH AND CORONARY ANGIOGRAPHY N/A 01/09/2020   Procedure: RIGHT/LEFT HEART CATH AND CORONARY ANGIOGRAPHY;  Surgeon: Tonny Bollman, MD;  Location: [facility] INVASIVE CV LAB;  Service: Cardiovascular;  Laterality: N/A;   TEE WITHOUT CARDIOVERSION N/A 01/29/2020   Procedure: TRANSESOPHAGEAL ECHOCARDIOGRAM (TEE);  Surgeon: Tonny Bollman, MD;  Location: [facility] OR;  Service: Open Heart Surgery;  Laterality: N/A;   THORACIC AORTIC ANEURYSM REPAIR  2000   type III   TRANSCATHETER AORTIC VALVE REPLACEMENT, TRANSAPICAL N/A 01/29/2020   Procedure: TRANSCATHETER AORTIC VALVE REPLACEMENT, TRANSAPICAL using a Edwards Sapien 3, 23mm  Transcatheter Heart Valve;  Surgeon: Tonny Bollman, MD;  Location: [facility] OR;  Service: Open Heart Surgery;  Laterality: N/A;   TRANSTHORACIC ECHOCARDIOGRAM  09/20/2012   EF=>55% with mild conc LVH; LA mod dilated; RA mildly dilated; mild MR/TR/AR  Family History  Problem Relation Age of Onset   Kidney disease Mother    Heart attack Father    Heart attack Maternal Grandmother    Valvular heart disease Son        valve replacement at 39   Cancer Neg Hx     Social History   Socioeconomic History   Marital status: Widowed    Spouse name: Not on file   Number of children: 2   Years of education: Not on file   Highest education level: Not on file  Occupational History    Employer: RETIRED  Tobacco Use   Smoking status: Former    Types: Cigarettes    Quit date: 09/04/1993    Years since quitting: 28.4   Smokeless tobacco: Never  Vaping Use    Vaping Use: Never used  Substance and Sexual Activity   Alcohol use: No   Drug use: No   Sexual activity: Not Currently  Other Topics Concern   Not on file  Social History Narrative   Lives at home and husband was just moved to [place]   Social Determinants of Corporate investment banker Strain: Low Risk    Difficulty of Paying Living Expenses: Not very hard  Food Insecurity: No Food Insecurity   Worried About Programme researcher, broadcasting/film/video in the Last Year: Never true   Ran Out of Food in the Last Year: Never true  Transportation Needs: No Transportation Needs   Lack of Transportation (Medical): No   Lack of Transportation (Non-Medical): No  Physical Activity: Insufficiently Active   Days of Exercise per Week: 4 days   Minutes of Exercise per Session: 30 min  Stress: No Stress Concern Present   Feeling of Stress : Not at all  Social Connections: Moderately Isolated   Frequency of Communication with Friends and Family: More than three times a week   Frequency of Social Gatherings with Friends and Family: Once a week   Attends Religious Services: 1 to 4 times per year   Active Member of [employer] or Organizations: No   Attends Banker Meetings: Never   Marital Status: Widowed  Catering manager Violence: Not At Risk   Fear of Current or Ex-Partner: No   Emotionally Abused: No   Physically Abused: No   Sexually Abused: No    Outpatient Medications Prior to Visit  Medication Sig Dispense Refill   ALPRAZolam (XANAX) 0.5 MG tablet TAKE 1 TABLET BY MOUTH THREE TIMES A DAY AS NEEDED 90 tablet 0   Blood Glucose Calibration (TRUE METRIX LEVEL 1) Low SOLN 1 each by In Vitro route in the morning and at bedtime. 100 each 3   Blood Glucose Monitoring Suppl (TRUE METRIX METER) DEVI 1 each by Does not apply route in the morning and at bedtime. 1 each 0   carvedilol (COREG) 25 MG tablet TAKE 1 TABLET BY MOUTH TWICE A DAY WITH MEALS 180 tablet 1   citalopram (CELEXA) 20 MG tablet  TAKE 1 TABLET EVERY DAY ALONG WITH 40MG  TO EQUAL 60MG  TOTAL 90 tablet 1   citalopram (CELEXA) 40 MG tablet TAKE 1 TABLET EVERY DAY ALONG WITH 20MG  TO EQUAL 60MG  TOTAL 90 tablet 1   [facility] ASPIRIN ADULT LOW DOSE 81 MG chewable tablet CHEW 1 TABLET BY MOUTH DAILY. 90 tablet 3   fluticasone (FLONASE) 50 MCG/ACT nasal spray USE 2 SPRAYS IN EACH NOSTIRL EVERY DAY 16 g 1   furosemide (LASIX) 20 MG tablet TAKE 1 TABLET  BY MOUTH EVERY DAY 90 tablet 3   furosemide (LASIX) 40 MG tablet Take 1 tablet (40 mg total) by mouth daily. 90 tablet 2   glucose blood (TRUE METRIX BLOOD GLUCOSE TEST) test strip Use as instructed 100 each 12   hyoscyamine (LEVSIN SL) 0.125 MG SL tablet TAKE 1 TABLET BY MOUTH TWICE A DAY AS NEEDED FOR CRAMPING OR DIARRHEA OR LOOSE STOOLS 180 tablet 1   JARDIANCE 10 MG TABS tablet TAKE 1 TABLET BY MOUTH EVERY DAY 90 tablet 1   losartan (COZAAR) 25 MG tablet TAKE 1/2 TABLET BY MOUTH AT BEDTIME. 45 tablet 7   Omega-3 Fatty Acids (FISH OIL) 1000 MG CAPS Take 1,000 mg by mouth daily.      Potassium Chloride ER 20 MEQ TBCR TAKE 1 TABLET BY MOUTH EVERY DAY 90 tablet 4   Probiotic Product (PRO-BIOTIC BLEND PO) Take 1 tablet by mouth daily.      rosuvastatin (CRESTOR) 20 MG tablet TAKE 1 TABLET BY MOUTH EVERY DAY 90 tablet 1   Semaglutide,0.25 or 0.5MG /DOS, (OZEMPIC, 0.25 OR 0.5 MG/DOSE,) 2 MG/1.5ML SOPN Inject 0.5 mg into the skin once a week. 3 mL 3   TRUEplus Lancets 33G MISC TEST BLOOD SUGAR TWICE DAILY IN THE MORNING AND AT BEDTIME 200 each 1   amoxicillin (AMOXIL) 500 MG tablet TAKE 4 TABLETS BY MOUTH 1 HOUR PRIOR TO DENTAL WORK, INCLUDING CLEANINGS (Patient not taking: Reported on 02/02/2022) 12 tablet 12   Ascorbic Acid (VITAMIN C) 1000 MG tablet Take 1,000 mg by mouth daily.     Facility-Administered Medications Prior to Visit  Medication Dose Route Frequency Provider Last Rate Last Admin   diclofenac sodium (VOLTAREN) 1 % transdermal gel 2 g  2 g Topical QID Zola Button, Jaeleen Inzunza R, DO         Allergies  Allergen Reactions   Oxycodone Itching   Vicodin [Hydrocodone-Acetaminophen] Itching   Latex Itching and Rash   Neomycin-Bacitracin Zn-Polymyx Other (See Comments)    Red , swollen eye   Sulfonamide Derivatives Other (See Comments)    Just had reaction when taking during pregnancy- last child birth 13    ROS Review of Systems  Constitutional:  Negative for appetite change, diaphoresis, fatigue and unexpected weight change.  Eyes:  Negative for pain, redness and visual disturbance.  Respiratory:  Negative for cough, chest tightness, shortness of breath and wheezing.   Cardiovascular:  Negative for chest pain, palpitations and leg swelling.  Endocrine: Negative for cold intolerance, heat intolerance, polydipsia, polyphagia and polyuria.  Genitourinary:  Negative for difficulty urinating, dysuria and frequency.  Neurological:  Negative for dizziness, light-headedness, numbness and headaches.     Objective:    Physical Exam Vitals and nursing note reviewed.  Constitutional:      Appearance: She is well-developed.  HENT:     Head: Normocephalic and atraumatic.  Eyes:     Conjunctiva/sclera: Conjunctivae normal.  Neck:     Thyroid: No thyromegaly.     Vascular: No carotid bruit or JVD.  Cardiovascular:     Rate and Rhythm: Normal rate and regular rhythm.     Heart sounds: Normal heart sounds. No murmur heard. Pulmonary:     Effort: Pulmonary effort is normal. No respiratory distress.     Breath sounds: Normal breath sounds. No wheezing or rales.  Chest:     Chest wall: No tenderness.  Musculoskeletal:     Cervical back: Normal range of motion and neck supple.  Neurological:  Mental Status: She is alert and oriented to person, place, and time.    BP 128/70 (BP Location: Left Arm, Patient Position: Sitting, Cuff Size: Large)   Pulse 86   Temp 98.1 F (36.7 C) (Oral)   Resp 18   Ht 5\' 1"  (1.549 m)   Wt 179 lb 3.2 oz (81.3 kg)   SpO2 96%   BMI  33.86 kg/m  Wt Readings from Last 3 Encounters:  02/02/22 179 lb 3.2 oz (81.3 kg)  11/02/21 180 lb (81.6 kg)  04/30/21 189 lb 12.8 oz (86.1 kg)     Health Maintenance Due  Topic Date Due   Zoster Vaccines- Shingrix (1 of 2) Never done   TETANUS/TDAP  09/01/2016   OPHTHALMOLOGY EXAM  01/21/2021   HEMOGLOBIN A1C  10/30/2021    There are no preventive care reminders to display for this patient.  Lab Results  Component Value Date   TSH 0.893 06/28/2019   Lab Results  Component Value Date   WBC 5.1 04/30/2021   HGB 11.9 (L) 04/30/2021   HCT 35.2 (L) 04/30/2021   MCV 100.1 (H) 04/30/2021   PLT 120.0 (L) 04/30/2021   Lab Results  Component Value Date   NA 141 04/30/2021   K 4.6 04/30/2021   CO2 23 04/30/2021   GLUCOSE 278 (H) 04/30/2021   BUN 37 (H) 04/30/2021   CREATININE 1.38 (H) 04/30/2021   BILITOT 0.4 04/30/2021   ALKPHOS 68 04/30/2021   AST 14 04/30/2021   ALT 11 04/30/2021   PROT 6.9 04/30/2021   ALBUMIN 4.2 04/30/2021   CALCIUM 9.4 04/30/2021   ANIONGAP 8 02/01/2020   EGFR 49 (L) 03/02/2021   GFR 37.69 (L) 04/30/2021   Lab Results  Component Value Date   CHOL 170 04/30/2021   Lab Results  Component Value Date   HDL 53.90 04/30/2021   Lab Results  Component Value Date   LDLCALC 83 04/30/2021   Lab Results  Component Value Date   TRIG 165.0 (H) 04/30/2021   Lab Results  Component Value Date   CHOLHDL 3 04/30/2021   Lab Results  Component Value Date   HGBA1C 7.1 (H) 04/30/2021      Assessment & Plan:   Problem List Items Addressed This Visit       Unprioritized   Severe obesity (BMI 35.0-35.9 with comorbidity) (HCC)   Acute on chronic combined systolic and diastolic CHF (congestive heart failure) (HCC)   Adrenal nodule (HCC)   Anxiety    Stable pt is doing well Uses prn xanax      Dermatomyositis (HCC)   Diabetes mellitus, type II (HCC)    hgba1c to be checked  minimize simple carbs. Increase exercise as tolerated. Continue  current meds       Relevant Orders   CBC with Differential/Platelet   Comprehensive metabolic panel   Hemoglobin A1c   Lipid panel   Microalbumin / creatinine urine ratio   Hyperlipidemia associated with type 2 diabetes mellitus (HCC)    Tolerating statin, encouraged heart healthy diet, avoid trans fats, minimize simple carbs and saturated fats. Increase exercise as tolerated      Relevant Orders   CBC with Differential/Platelet   Comprehensive metabolic panel   Hemoglobin A1c   Lipid panel   Microalbumin / creatinine urine ratio   ICD (implantable cardioverter-defibrillator) in place    Per cardiology      Preventative health care    ghm utd Check labs  See avs  Primary hypertension - Primary    Well controlled, no changes to meds. Encouraged heart healthy diet such as the DASH diet and exercise as tolerated.       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   Follow-up: Return in about 6 months (around 08/05/2022), or if symptoms worsen or fail to improve, for annual exam, fasting.    Donato Schultz, DO

## 2022-02-02 NOTE — Assessment & Plan Note (Signed)
Per cardiology and cvts ?

## 2022-02-02 NOTE — Assessment & Plan Note (Signed)
Check labs today.

## 2022-02-02 NOTE — Assessment & Plan Note (Signed)
ghm utd Check labs  See avs  

## 2022-02-02 NOTE — Assessment & Plan Note (Signed)
hgba1c to be checked minimize simple carbs. Increase exercise as tolerated. Continue current meds 

## 2022-02-02 NOTE — Assessment & Plan Note (Signed)
Tolerating statin, encouraged heart healthy diet, avoid trans fats, minimize simple carbs and saturated fats. Increase exercise as tolerated 

## 2022-02-02 NOTE — Assessment & Plan Note (Signed)
Per cardiology 

## 2022-02-02 NOTE — Assessment & Plan Note (Signed)
Stable pt is doing well ?Uses prn xanax ?

## 2022-02-02 NOTE — Assessment & Plan Note (Signed)
Per cvts 

## 2022-02-03 ENCOUNTER — Other Ambulatory Visit: Payer: Self-pay | Admitting: Family Medicine

## 2022-02-03 DIAGNOSIS — F419 Anxiety disorder, unspecified: Secondary | ICD-10-CM

## 2022-02-03 NOTE — Telephone Encounter (Signed)
Patient comment: Please refill my alprazolam. I  forgot it yesterday. Thank you so much for your kindness ? ?Requesting: alprazolam ?Contract: 09/02/20 ?UDS: 09/02/20 ?Last Visit: 02/02/22 ?Next Visit: none ?Last Refill: 01/07/22 ? ?Please Advise ? ?

## 2022-02-08 ENCOUNTER — Telehealth: Payer: Self-pay | Admitting: Family Medicine

## 2022-02-08 NOTE — Telephone Encounter (Signed)
Pt called wanting to review labs again  ? ? ?Please advise  ?

## 2022-02-09 ENCOUNTER — Other Ambulatory Visit: Payer: Self-pay

## 2022-02-09 DIAGNOSIS — N289 Disorder of kidney and ureter, unspecified: Secondary | ICD-10-CM

## 2022-02-09 NOTE — Telephone Encounter (Signed)
Referral placed.

## 2022-02-09 NOTE — Telephone Encounter (Signed)
Pt called to go over labs/referral. Please advise.  ?

## 2022-02-09 NOTE — Telephone Encounter (Signed)
LM requesting call back.  ?Pt was to decide on nephrology referral (see lab results).  ?

## 2022-02-18 ENCOUNTER — Other Ambulatory Visit: Payer: Self-pay | Admitting: Family Medicine

## 2022-02-18 DIAGNOSIS — E1169 Type 2 diabetes mellitus with other specified complication: Secondary | ICD-10-CM

## 2022-02-22 ENCOUNTER — Ambulatory Visit (INDEPENDENT_AMBULATORY_CARE_PROVIDER_SITE_OTHER): Payer: Medicare HMO

## 2022-02-22 DIAGNOSIS — I495 Sick sinus syndrome: Secondary | ICD-10-CM | POA: Diagnosis not present

## 2022-02-22 DIAGNOSIS — I472 Ventricular tachycardia, unspecified: Secondary | ICD-10-CM

## 2022-02-23 ENCOUNTER — Telehealth: Payer: Medicare HMO

## 2022-02-24 ENCOUNTER — Encounter: Payer: Self-pay | Admitting: Cardiovascular Disease

## 2022-02-24 LAB — CUP PACEART REMOTE DEVICE CHECK
Battery Remaining Longevity: 66 mo
Battery Voltage: 2.98 V
Brady Statistic AP VP Percent: 99.88 %
Brady Statistic AP VS Percent: 0 %
Brady Statistic AS VP Percent: 0.12 %
Brady Statistic AS VS Percent: 0 %
Brady Statistic RA Percent Paced: 99.88 %
Brady Statistic RV Percent Paced: 99.97 %
Date Time Interrogation Session: 20230327022705
HighPow Impedance: 76 Ohm
Implantable Lead Implant Date: 20071016
Implantable Lead Implant Date: 20071016
Implantable Lead Implant Date: 20200730
Implantable Lead Location: 753859
Implantable Lead Location: 753860
Implantable Lead Location: 753860
Implantable Lead Model: 4092
Implantable Lead Model: 5594
Implantable Lead Model: 6935
Implantable Pulse Generator Implant Date: 20200730
Lead Channel Impedance Value: 399 Ohm
Lead Channel Impedance Value: 437 Ohm
Lead Channel Impedance Value: 494 Ohm
Lead Channel Pacing Threshold Amplitude: 0.625 V
Lead Channel Pacing Threshold Amplitude: 0.625 V
Lead Channel Pacing Threshold Pulse Width: 0.4 ms
Lead Channel Pacing Threshold Pulse Width: 0.4 ms
Lead Channel Sensing Intrinsic Amplitude: 26.375 mV
Lead Channel Sensing Intrinsic Amplitude: 26.375 mV
Lead Channel Sensing Intrinsic Amplitude: 3.375 mV
Lead Channel Sensing Intrinsic Amplitude: 3.375 mV
Lead Channel Setting Pacing Amplitude: 1.5 V
Lead Channel Setting Pacing Amplitude: 2.5 V
Lead Channel Setting Pacing Pulse Width: 0.4 ms
Lead Channel Setting Sensing Sensitivity: 0.3 mV

## 2022-03-02 ENCOUNTER — Telehealth: Payer: Medicare HMO

## 2022-03-02 ENCOUNTER — Telehealth: Payer: Self-pay

## 2022-03-02 NOTE — Telephone Encounter (Signed)
?  Care Management  ? ?Follow Up Note ? ? ?03/02/2022 ?Name: [name] MRN: [id number] DOB: Aug 17, 1946 ? ? ?Referred by: [name], DO ?Reason for referral : No chief complaint on file. ? ? ?An unsuccessful telephone outreach was attempted today. The patient was referred to the case management team for assistance with care management and care coordination.  A HIPPA compliant phone message was left for the patient providing contact information and requesting a return call.  ? ?Follow Up Plan:The Care Management Team will reach out to patient again over the next 30 days. ? ?Thea Silversmith, RN, MSN, BSN, CCM ?Care Management Coordinator ?[facility] ?[phone]  ?

## 2022-03-03 ENCOUNTER — Other Ambulatory Visit: Payer: Self-pay | Admitting: Family Medicine

## 2022-03-03 DIAGNOSIS — F419 Anxiety disorder, unspecified: Secondary | ICD-10-CM

## 2022-03-03 NOTE — Progress Notes (Signed)
Remote ICD transmission.   

## 2022-03-04 ENCOUNTER — Other Ambulatory Visit: Payer: Self-pay | Admitting: Family Medicine

## 2022-03-04 DIAGNOSIS — F419 Anxiety disorder, unspecified: Secondary | ICD-10-CM

## 2022-03-04 NOTE — Telephone Encounter (Signed)
Requesting: alprazolam 0.'5mg'$   ?Contract: 09/02/20 ?UDS: 09/02/20 ?Last Visit: 02/02/22 ?Next Visit: None ?Last Refill: 02/04/22 #90 and 0RF ? ?Please Advise ? ?

## 2022-03-04 NOTE — Telephone Encounter (Signed)
Requesting: alprazolam ?Contract: 09/02/20 ?UDS: 09/02/20 ?Last Visit: 02/02/22 ?Next Visit: none ?Last Refill: 02/04/22 ? ?Please Advise ? ?

## 2022-03-08 ENCOUNTER — Other Ambulatory Visit: Payer: Self-pay

## 2022-03-08 ENCOUNTER — Other Ambulatory Visit: Payer: Self-pay | Admitting: Family Medicine

## 2022-03-08 ENCOUNTER — Encounter: Payer: Medicare HMO | Admitting: Cardiovascular Disease

## 2022-03-08 DIAGNOSIS — E1165 Type 2 diabetes mellitus with hyperglycemia: Secondary | ICD-10-CM

## 2022-03-08 DIAGNOSIS — F419 Anxiety disorder, unspecified: Secondary | ICD-10-CM

## 2022-03-08 MED ORDER — ALPRAZOLAM 0.5 MG PO TABS: 0.5000 mg | ORAL_TABLET | Freq: Three times a day (TID) | ORAL | 0 refills | Status: DC | PRN

## 2022-03-08 NOTE — Addendum Note (Signed)
Addended by: Sanda Linger on: 03/08/2022 01:53 PM ? ? Modules accepted: Orders ? ?

## 2022-03-08 NOTE — Telephone Encounter (Signed)
Rx was never filled. ? ?Requesting: Alprazolam ?Contract: 09/02/20 ?UDS:09/02/20 ?Last Visit: 02/02/22 ?Next Visit:  none ?Last Refill: 02/04/22 ? ?Please Advise ? ?

## 2022-03-08 NOTE — Telephone Encounter (Signed)
Pt would like medication sent in. ?

## 2022-03-11 ENCOUNTER — Ambulatory Visit (HOSPITAL_COMMUNITY)
Admission: RE | Admit: 2022-03-11 | Discharge: 2022-03-11 | Disposition: A | Discharge status: Home / Self Care | Payer: Medicare HMO | Source: Ambulatory Visit | Attending: Cardiovascular Disease | Admitting: Cardiovascular Disease

## 2022-03-11 DIAGNOSIS — I7772 Dissection of iliac artery: Secondary | ICD-10-CM | POA: Diagnosis not present

## 2022-03-11 DIAGNOSIS — N200 Calculus of kidney: Secondary | ICD-10-CM | POA: Diagnosis not present

## 2022-03-11 DIAGNOSIS — I71012 Dissection of descending thoracic aorta: Secondary | ICD-10-CM | POA: Diagnosis not present

## 2022-03-11 DIAGNOSIS — I71019 Dissection of thoracic aorta, unspecified: Secondary | ICD-10-CM | POA: Insufficient documentation

## 2022-03-11 DIAGNOSIS — I700 Atherosclerosis of aorta: Secondary | ICD-10-CM | POA: Diagnosis not present

## 2022-03-11 DIAGNOSIS — K5730 Diverticulosis of large intestine without perforation or abscess without bleeding: Secondary | ICD-10-CM | POA: Diagnosis not present

## 2022-03-11 MED ORDER — SODIUM CHLORIDE (PF) 0.9 % IJ SOLN
INTRAMUSCULAR | Status: AC
  Filled 2022-03-11: qty 50

## 2022-03-11 MED ORDER — IOHEXOL 350 MG/ML SOLN
80.0000 mL | Freq: Once | INTRAVENOUS | Status: AC | PRN
  Administered 2022-03-11: 80 mL via INTRAVENOUS

## 2022-03-18 ENCOUNTER — Ambulatory Visit (INDEPENDENT_AMBULATORY_CARE_PROVIDER_SITE_OTHER): Payer: Medicare HMO

## 2022-03-18 DIAGNOSIS — I10 Essential (primary) hypertension: Secondary | ICD-10-CM

## 2022-03-18 DIAGNOSIS — E1169 Type 2 diabetes mellitus with other specified complication: Secondary | ICD-10-CM

## 2022-03-18 DIAGNOSIS — N1832 Chronic kidney disease, stage 3b: Secondary | ICD-10-CM

## 2022-03-18 NOTE — Chronic Care Management (AMB) (Signed)
?Chronic Care Management  ? ?CCM RN Visit Note ? ?03/18/2022 ?Name: [name] MRN: [id number] DOB: Feb 01, 1946 ? ?Subjective: ?[name] is a 76 y.o. year old female who is a primary care patient of [name], DO. The care management team was consulted for assistance with disease management and care coordination needs.   ? ?Engaged with patient by telephone for follow up visit in response to provider referral for case management and/or care coordination services.  ? ?Consent to Services:  ?The patient was given information about Chronic Care Management services, agreed to services, and gave verbal consent prior to initiation of services.  Please see initial visit note for detailed documentation.  ? ?Patient agreed to services and verbal consent obtained.  ? ?Assessment: Review of patient past medical history, allergies, medications, health status, including review of consultants reports, laboratory and other test data, was performed as part of comprehensive evaluation and provision of chronic care management services.  ? ?SDOH (Social Determinants of Health) assessments and interventions performed:   ? ?CCM Care Plan ? ?Allergies  ?Allergen Reactions  ? Oxycodone Itching  ? Vicodin [Hydrocodone-Acetaminophen] Itching  ? Latex Itching and Rash  ? Neomycin-Bacitracin Zn-Polymyx Other (See Comments)  ?  Red , swollen eye  ? Sulfonamide Derivatives Other (See Comments)  ?  Just had reaction when taking during pregnancy- last child birth 67  ? ? ?Outpatient Encounter Medications as of 03/18/2022  ?Medication Sig Note  ? ALPRAZolam (XANAX) 0.5 MG tablet Take 1 tablet (0.5 mg total) by mouth 3 (three) times daily as needed.   ? carvedilol (COREG) 25 MG tablet TAKE 1 TABLET BY MOUTH TWICE A DAY WITH MEALS   ? citalopram (CELEXA) 20 MG tablet TAKE 1 TABLET EVERY DAY ALONG WITH '40MG'$  TO EQUAL '60MG'$  TOTAL   ? citalopram (CELEXA) 40 MG tablet TAKE 1 TABLET EVERY DAY ALONG WITH '20MG'$  TO EQUAL '60MG'$  TOTAL   ?  CVS ASPIRIN ADULT LOW DOSE 81 MG chewable tablet CHEW 1 TABLET BY MOUTH DAILY.   ? fluticasone (FLONASE) 50 MCG/ACT nasal spray USE 2 SPRAYS IN EACH NOSTIRL EVERY DAY 06/26/2021: Reports takes as needed  ? furosemide (LASIX) 20 MG tablet TAKE 1 TABLET BY MOUTH EVERY DAY 12/29/2021: Reports takes 20 mg every other day  ? furosemide (LASIX) 40 MG tablet Take 1 tablet (40 mg total) by mouth daily. 12/29/2021: Reports takes as needed  ? hyoscyamine (LEVSIN SL) 0.125 MG SL tablet TAKE 1 TABLET BY MOUTH TWICE A DAY AS NEEDED FOR CRAMPING OR DIARRHEA OR LOOSE STOOLS   ? JARDIANCE 10 MG TABS tablet TAKE 1 TABLET BY MOUTH EVERY DAY   ? losartan (COZAAR) 25 MG tablet TAKE 1/2 TABLET BY MOUTH AT BEDTIME.   ? Omega-3 Fatty Acids (FISH OIL) 1000 MG CAPS Take 1,000 mg by mouth daily.    ? Potassium Chloride ER 20 MEQ TBCR TAKE 1 TABLET BY MOUTH EVERY DAY   ? Probiotic Product (PRO-BIOTIC BLEND PO) Take 1 tablet by mouth daily.    ? rosuvastatin (CRESTOR) 20 MG tablet TAKE 1 TABLET BY MOUTH EVERY DAY   ? Semaglutide,0.25 or 0.'5MG'$ /DOS, (OZEMPIC, 0.25 OR 0.5 MG/DOSE,) 2 MG/3ML SOPN INJECT 0.'5MG'$  INTO THE SKIN ONCE A WEEK   ? amoxicillin (AMOXIL) 500 MG tablet TAKE 4 TABLETS BY MOUTH 1 HOUR PRIOR TO DENTAL WORK, INCLUDING CLEANINGS (Patient not taking: Reported on 02/02/2022)   ? Blood Glucose Calibration (TRUE METRIX LEVEL 1) Low SOLN 1 each by In Vitro  [name] in the morning and at bedtime.   ? Blood Glucose Monitoring Suppl (TRUE METRIX METER) DEVI 1 each by Does not apply [name] in the morning and at bedtime.   ? glucose blood (TRUE METRIX BLOOD GLUCOSE TEST) test strip Use as instructed   ? Tdap (BOOSTRIX) 5-2.5-18.5 LF-MCG/0.5 injection Inject into the muscle.   ? TRUEplus Lancets 33G MISC TEST BLOOD SUGAR TWICE DAILY IN THE MORNING AND AT BEDTIME   ? Zoster Vaccine Adjuvanted [facility]) injection Inject into the muscle.   ? ?Facility-Administered Encounter Medications as of 03/18/2022  ?Medication  ? diclofenac sodium (VOLTAREN) 1 %  transdermal gel 2 g  ? ? ?Patient Active Problem List  ? Diagnosis Date Noted  ? Dermatomyositis ([place]) 02/02/2022  ? Adrenal nodule ([place]) 02/02/2022  ? Preventative health care 04/30/2021  ? Hyperlipidemia associated with type 2 diabetes mellitus ([place]) 09/02/2020  ? Uncontrolled type 2 diabetes mellitus with hyperglycemia ([place]) 09/02/2020  ? Acute on chronic combined systolic and diastolic CHF (congestive heart failure) ([place]) 01/29/2020  ? S/P TAVR (transcatheter aortic valve replacement) 01/29/2020  ? Type 2 diabetes mellitus with complication (HCC)   ? Severe aortic stenosis   ? CKD (chronic kidney disease) stage 3, GFR 30-59 ml/min (HCC)   ? ICD (implantable cardioverter-defibrillator) in place 08/15/2019  ? Ventricular tachycardia ([place])   ? Anemia 06/27/2019  ? Severe obesity (BMI 35.0-35.9 with comorbidity) ([place]) 12/08/2018  ? Diabetes mellitus, type II ([place]) 04/24/2015  ? Paroxysmal atrial flutter ([place]) 10/07/2013  ? GERD 01/15/2011  ? 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 Hyperlipidemia LDL goal <100 05/23/2007  ? Anxiety 05/23/2007  ? Primary hypertension 05/23/2007  ? ? ?Conditions to be addressed/monitored:HTN, HLD, and DMII ? ?Care Plan : RN Care Manager Plan of Care  ?Updates made by Anna Rued, RN since 03/18/2022 12:00 AM  ?  ? ?Problem: Chronic Disease Management Education and/or Care Coordination needs   ?Priority: High  ?  ? ?Long-Range Goal: Development of plan of care for Chronic Disease Management   ?Start Date: 12/29/2021  ?Expected End Date: 06/28/2022  ?Priority: High  ?Note:   ?Current Barriers:  ?02/18/22 - Reports, "I am doing wonderfully well". Blood sugar today was 110. Reports her blood sugars have been ranging 98-110. Denies any hypoglycemic episodes. She expresses concern that there is a waiting list at her local pharmacy to receive Ozempic. She reports current medication/dose  received from her PCP office. She states she is following up weekly with her pharmacy to see if the medication has come in. She reports she is check blood pressure, but is not recording. She reports last blood pressure was taken yesterday, but could not remember exact number estimating her pressure was around 140/60. She continues to remain active and does some type of activity every day such as walking in the house, housework, shopping. Expressing no specific health concerns at this time. ?03/18/22 [name] reports she is doing well. She states she has received Ozempic and it taking as prescribed. She reports she is able to obtain medication at this time. She reports Blood sugar range 110-150. Denies any hypoglycemic reactions. She denies any issues with blood pressure and reports she is eating healthy and monitoring her salt intake. [name] reports there has been discussion of a nephrology referral and reports she has a cardiology appointment on 03/29/22. She states she would like to  speak with cardiologist prior to being referred to nephrology. [name] expressed she is excited to be traveling to [place] today. She denies any questions or concerns at this time.  ? ?Knowledge Deficits related to plan of care for management of CHF, HTN, HLD, and DMII  ?Chronic Disease Management support and education needs related to CHF, HTN, HLD, and DMII ? ?RNCM Clinical Goal(s):  ?Patient will verbalize understanding of plan for management of CHF, HTN, HLD, and DMII as evidenced by self report and/or chart notation ?demonstrate ongoing health management independence as evidenced by attending provider visits as scheduled, taking medications as prescribed, following up with provider with questions or concerns as needed        through collaboration with RN Care manager, provider, and care team.  ? ?Interventions: ?1:1 collaboration with primary care provider regarding development and update of comprehensive plan of  care as evidenced by provider attestation and co-signature ?Inter-disciplinary care team collaboration (see longitudinal plan of care) ?Evaluation of current treatment plan related to  self management and p

## 2022-03-18 NOTE — Patient Instructions (Signed)
Visit Information ? ?Thank you for taking time to visit with me today. Please don't hesitate to contact me if I can be of assistance to you before our next scheduled telephone appointment. ? ?Following are the goals we discussed today:  ?Patient Goals/Self-Care Activities: ?Take medications as prescribed   ?Attend all scheduled provider appointments ?use salt in moderation ?manage portion size ?Continue to eat healthy: lean meats, fruits, vegetables, avoid foods that are high in saturated fats and transfats, avoid processed foods. ?Continue to remain active ?Continue to routinely monitor your blood pressure. Ask you provider what is your Target Blood pressure range ? ?Our next appointment is by telephone on 04/06/22 at 2:00 pm ? ?Please call the care guide team at [phone] if you need to cancel or reschedule your appointment.  ? ?If you are experiencing a Mental Health or [place] or need someone to talk to, please call the Suicide and Crisis Lifeline: 988 ?call 1-800-273-TALK (toll free, 24 hour hotline)  ? ?Patient verbalizes understanding of instructions and care plan provided today and agrees to view in [place]. Active MyChart status confirmed with patient.   ? ?Thea Silversmith, RN, MSN, BSN, CCM ?Care Management Coordinator ?[place] ?[phone]  ?

## 2022-03-22 ENCOUNTER — Ambulatory Visit: Payer: Medicare HMO | Admitting: Pharmacist

## 2022-03-22 ENCOUNTER — Other Ambulatory Visit: Payer: Self-pay | Admitting: *Deleted

## 2022-03-22 ENCOUNTER — Telehealth: Payer: Self-pay | Admitting: Pharmacist

## 2022-03-22 DIAGNOSIS — I71019 Dissection of thoracic aorta, unspecified: Secondary | ICD-10-CM

## 2022-03-22 NOTE — Telephone Encounter (Signed)
?  Care Management  ? ?Follow Up Note ? ? ?03/22/2022 ?Name: IMARA STANDIFORD MRN: [id number] DOB: 03-15-1946 ? ? ?Referred by: [name], DO ?Reason for referral : No chief complaint on file. ? ? ?An unsuccessful telephone outreach was attempted today. The patient was referred to the case management team for assistance with care management and care coordination.  ? ?Has follow up Chronic Care Management phone visit with Clinical Pharmacist Practitioner today. ? ?Follow Up Plan: The care management team will reach out to the patient again over the next 30 days.  (To rescheduled appointment) ? ? ?[name], PharmD ?Clinical Pharmacist ?[facility] ?[place] ? ? ? ? ?

## 2022-03-24 NOTE — Chronic Care Management (AMB) (Signed)
?  Care Management  ? ?Note ? ?03/24/2022 ?Name: SAISHA HOGUE MRN: [id number] DOB: 06/22/1946 ? ?ELLIN FITZGIBBONS is a 76 y.o. year old female who is a primary care patient of [name], DO and is actively engaged with the care management team. I reached out to [name] by phone today to assist with re-scheduling a follow up visit with the Pharmacist ? ?Follow up plan: ?Telephone appointment with care management team member scheduled for: 04/27/2022 ? ?Deosha Werden, CCMA ?Care Guide, Embedded Care Coordination ?[place]  Care Management  ?Direct Dial: [phone] ? ? ?

## 2022-03-24 NOTE — Chronic Care Management (AMB) (Signed)
Opened in error

## 2022-03-24 NOTE — Telephone Encounter (Signed)
Spoke with pt and scheduled physical for 08/12/22 at 9:20am and she is aware to be fasting. ?

## 2022-03-28 ENCOUNTER — Other Ambulatory Visit: Payer: Self-pay | Admitting: Cardiovascular Disease

## 2022-03-28 DIAGNOSIS — E1159 Type 2 diabetes mellitus with other circulatory complications: Secondary | ICD-10-CM

## 2022-03-28 DIAGNOSIS — E785 Hyperlipidemia, unspecified: Secondary | ICD-10-CM | POA: Diagnosis not present

## 2022-03-28 DIAGNOSIS — Z7984 Long term (current) use of oral hypoglycemic drugs: Secondary | ICD-10-CM

## 2022-03-28 DIAGNOSIS — I509 Heart failure, unspecified: Secondary | ICD-10-CM

## 2022-03-28 DIAGNOSIS — I110 Hypertensive heart disease with heart failure: Secondary | ICD-10-CM | POA: Diagnosis not present

## 2022-03-29 ENCOUNTER — Encounter: Payer: Medicare HMO | Admitting: Cardiovascular Disease

## 2022-04-04 ENCOUNTER — Other Ambulatory Visit: Payer: Self-pay | Admitting: Family Medicine

## 2022-04-04 DIAGNOSIS — F419 Anxiety disorder, unspecified: Secondary | ICD-10-CM

## 2022-04-05 ENCOUNTER — Ambulatory Visit (INDEPENDENT_AMBULATORY_CARE_PROVIDER_SITE_OTHER): Payer: Medicare HMO | Admitting: Cardiovascular Disease

## 2022-04-05 ENCOUNTER — Encounter: Payer: Self-pay | Admitting: Cardiovascular Disease

## 2022-04-05 VITALS — BP 102/68 | HR 69 | Ht 60.0 in | Wt 182.0 lb

## 2022-04-05 DIAGNOSIS — Z95810 Presence of automatic (implantable) cardiac defibrillator: Secondary | ICD-10-CM

## 2022-04-05 DIAGNOSIS — I495 Sick sinus syndrome: Secondary | ICD-10-CM

## 2022-04-05 DIAGNOSIS — I472 Ventricular tachycardia, unspecified: Secondary | ICD-10-CM

## 2022-04-05 DIAGNOSIS — I10 Essential (primary) hypertension: Secondary | ICD-10-CM | POA: Diagnosis not present

## 2022-04-05 DIAGNOSIS — I442 Atrioventricular block, complete: Secondary | ICD-10-CM | POA: Diagnosis not present

## 2022-04-05 DIAGNOSIS — E118 Type 2 diabetes mellitus with unspecified complications: Secondary | ICD-10-CM

## 2022-04-05 DIAGNOSIS — E7800 Pure hypercholesterolemia, unspecified: Secondary | ICD-10-CM

## 2022-04-05 DIAGNOSIS — I4892 Unspecified atrial flutter: Secondary | ICD-10-CM | POA: Diagnosis not present

## 2022-04-05 DIAGNOSIS — Z952 Presence of prosthetic heart valve: Secondary | ICD-10-CM | POA: Diagnosis not present

## 2022-04-05 DIAGNOSIS — Z6835 Body mass index (BMI) 35.0-35.9, adult: Secondary | ICD-10-CM

## 2022-04-05 DIAGNOSIS — I7101 Dissection of ascending aorta: Secondary | ICD-10-CM

## 2022-04-05 DIAGNOSIS — I5042 Chronic combined systolic (congestive) and diastolic (congestive) heart failure: Secondary | ICD-10-CM | POA: Diagnosis not present

## 2022-04-05 DIAGNOSIS — N1832 Chronic kidney disease, stage 3b: Secondary | ICD-10-CM

## 2022-04-05 MED ORDER — ALPRAZOLAM 0.5 MG PO TABS: 0.5000 mg | ORAL_TABLET | Freq: Three times a day (TID) | ORAL | 0 refills | Status: DC | PRN

## 2022-04-05 NOTE — Progress Notes (Signed)
? ?Cardiology Office Note   ? ?Date:  04/05/2022  ? ?ID:  [name], DOB [birth year], MRN [id number] ? ?PCP:  [name], [name]  ?Cardiologist:   [name], [name]  ? ?Chief Complaint  ?Patient presents with  ? Congestive Heart Failure  ? ? ?History of Present Illness:  ?[name] is a 76 y.o. female with a history of repaired type A aortic dissection (resuspended native aortic valve, persistent intimal flap involving the aortic arch, left subclavian artery and abdominal aorta), subsequent development of severe paradoxical low flow low gradient AS/AI and TAVR with 23 mm [name] 3 Ultra THV via the TA approach on 01/29/20, chronic predominantly diastolic heart failure (EF 45%), sustained monomorphic ventricular tachycardia status post ICD implantation August 2020 ([name]), pre-existing sinus node dysfunction with sinus node arrest, "atrially-pacemaker dependent", atypical LBBB with very long AV conduction time and 100% V pacing, history of morbid obesity, diabetes mellitus, hypertension.  She had normal coronary arteries by CT angiography in August 2020. ? ?She generally feels well, just wishes she had more energy.  Denies exertional dyspnea orthopnea PND or lower extremity edema.  She has not had chest pain, palpitations, dizziness or syncope.  She denies any falls, bleeding problems or serious injuries. ? ?She most recently had a CT angiogram of the aorta on 03/11/2022 that showed stable aortic dissection from the origin of the innominate artery all the way to the abdominal aorta to the left common iliac and external iliac arteries, but without evidence of dilation of the aorta.  The findings of aortic atherosclerosis, right adrenal adenoma, sigmoid diverticulosis, nonobstructive left nephrolithiasis and a sliding-type hiatal hernia were reported. ? ?ICD interrogation shows normal function.  Estimated generator longevity is 5.4 years.  She has 99.8% atrial pacing and 99.9%  ventricular pacing.  There have been no episodes of sustained or nonsustained VT and no atrial fibrillation.  Heart rate histograms are appropriate for her sedentary lifestyle (activity level is only 0.7 hours/day).  Her OptiVol is slightly out of range, but is quickly trending back to normal.  (She blames this on a recent trip to the beach, less discretion with sodium intake.). ? ?Her pacemaker was upgraded to ICD in August 2020. AAIR-DDDR programming was associated with only 20% V pacing, but MVP related pauses were associated with long-short sequences and precipitation of polymorphic VT. Reprogrammed DDDR, she has 100% V pacing and a subsequent slight reduction in LVEF, but without exacerbation of clinical CHF. No recurrent VT. Last saw Dr. Lovena [name] in March 2021, when her AV delay was tightened to a physiological range.  ? ?Her most recent echocardiogram was performed on 01/28/2021. LV EF 45-50%, mean TAVR gradient 9 mm Hg, no leak.  Improved estimation of estimated SPAP at 28 mmHg with normal right atrial pressure (previous echo showed PAH at 53 mm Hg).   ? ?She is on high-dose carvedilol as well as a relatively low dose of losartan and Jardiance for CHF.  She takes furosemide in a very low-dose.  On statin. ? ?Past Medical History:  ?Diagnosis Date  ? Acute thoracic aortic dissection ([place]) 2008  ? emergency surgery - [name]  ? AICD (automatic cardioverter/defibrillator) present   ? Anxiety   ? Cancer [facility])   ? skin  ? CKD (chronic kidney disease) stage 3, GFR 30-59 ml/min (HCC)   ? Diabetes mellitus   ? Diverticulosis   ? GERD (gastroesophageal reflux disease)   ? Hemorrhoids   ? History of  kidney stones   ? Hyperlipidemia   ? Hypertension   ? Kidney stones   ? S/P TAVR (transcatheter aortic valve replacement) 01/29/2020  ? s/p TAVR with a 23 mm [name] 3 Ultra via the TA approach wtih Dr. Burt [name] and Dr. Cyndia [name]  ? Severe aortic stenosis   ? Sinus node dysfunction (HCC)   ? hx of PPM  ? Sustained  ventricular tachycardia ([place])   ? s/p ICD 05/2019  ? ? ?Past Surgical History:  ?Procedure Laterality Date  ? ABDOMINAL HYSTERECTOMY  1984  ? APPENDECTOMY    ? EYE SURGERY  2014  ? Rght eye  ? ICD IMPLANT N/A 06/28/2019  ? Procedure: PPM upgrade to ICD;  Surgeon: [name], [name];  Location: [place] CV LAB;  Service: Cardiovascular;  Laterality: N/A;  ? PACEMAKER GENERATOR CHANGE  2014  ? Anna adapta  ? PACEMAKER GENERATOR CHANGE N/A 08/29/2013  ? Procedure: PACEMAKER GENERATOR CHANGE;  Surgeon: [name], [name];  Location: [place] [facility] CATH LAB;  Service: Cardiovascular;  Laterality: N/A;  ? PACEMAKER INSERTION  08/2006  ? dual chamber Anna EnRhythm; r/t sinus node dysfunction   ? REPAIR OF ACUTE ASCENDING THORACIC AORTIC DISSECTION  2008  ? Dr. Servando [name]  ? RIGHT/LEFT HEART CATH AND CORONARY ANGIOGRAPHY N/A 01/09/2020  ? Procedure: RIGHT/LEFT HEART CATH AND CORONARY ANGIOGRAPHY;  Surgeon: [name], [name];  Location: [place] [facility] CV LAB;  Service: Cardiovascular;  Laterality: N/A;  ? TEE WITHOUT CARDIOVERSION N/A 01/29/2020  ? Procedure: TRANSESOPHAGEAL ECHOCARDIOGRAM (TEE);  Surgeon: [name], [name];  Location: [facility];  Service: Open Heart Surgery;  Laterality: N/A;  ? THORACIC AORTIC ANEURYSM REPAIR  2000  ? type III  ? TRANSCATHETER AORTIC VALVE REPLACEMENT, TRANSAPICAL N/A 01/29/2020  ? Procedure: TRANSCATHETER AORTIC VALVE REPLACEMENT, TRANSAPICAL using a [name] 3, 32m  Transcatheter Heart Valve;  Surgeon: CSherren [name] [name];  Location: [facility]  Service: Open Heart Surgery;  Laterality: N/A;  ? TRANSTHORACIC ECHOCARDIOGRAM  09/20/2012  ? EF=>55% with mild conc LVH; LA mod dilated; RA mildly dilated; mild MR/TR/AR  ? ? ?Current Medications: ?Outpatient Medications Prior to Visit  ?Medication Sig Dispense Refill  ? Blood Glucose Calibration (TRUE METRIX LEVEL 1) Low SOLN 1 each by In Vitro route in the morning and at bedtime. 100 each 3  ? Blood Glucose Monitoring Suppl (TRUE METRIX METER) DEVI 1  each by Does not apply route in the morning and at bedtime. 1 each 0  ? carvedilol (COREG) 25 MG tablet TAKE 1 TABLET BY MOUTH TWICE A DAY WITH MEALS 180 tablet 1  ? citalopram (CELEXA) 20 MG tablet TAKE 1 TABLET EVERY DAY ALONG WITH '40MG'$  TO EQUAL '60MG'$  TOTAL 90 tablet 1  ? citalopram (CELEXA) 40 MG tablet TAKE 1 TABLET EVERY DAY ALONG WITH '20MG'$  TO EQUAL '60MG'$  TOTAL 90 tablet 1  ? CVS ASPIRIN ADULT LOW DOSE 81 MG chewable tablet CHEW 1 TABLET BY MOUTH DAILY. 90 tablet 3  ? fluticasone (FLONASE) 50 MCG/ACT nasal spray USE 2 SPRAYS IN EACH NOSTIRL EVERY DAY 16 g 1  ? glucose blood (TRUE METRIX BLOOD GLUCOSE TEST) test strip Use as instructed 100 each 12  ? hyoscyamine (LEVSIN SL) 0.125 MG SL tablet TAKE 1 TABLET BY MOUTH TWICE A DAY AS NEEDED FOR CRAMPING OR DIARRHEA OR LOOSE STOOLS 180 tablet 1  ? JARDIANCE 10 MG TABS tablet TAKE 1 TABLET BY MOUTH EVERY DAY 90 tablet 1  ? losartan (COZAAR) 25 MG tablet TAKE 1/2 TABLET BY  MOUTH AT BEDTIME. 45 tablet 7  ? Potassium Chloride ER 20 MEQ TBCR TAKE 1 TABLET BY MOUTH EVERY DAY 90 tablet 4  ? Probiotic Product (PRO-BIOTIC BLEND PO) Take 1 tablet by mouth daily.     ? rosuvastatin (CRESTOR) 20 MG tablet TAKE 1 TABLET BY MOUTH EVERY DAY 90 tablet 1  ? Semaglutide,0.25 or 0.'5MG'$ /DOS, (OZEMPIC, 0.25 OR 0.5 MG/DOSE,) 2 MG/3ML SOPN INJECT 0.'5MG'$  INTO THE SKIN ONCE A WEEK 9 mL 0  ? TRUEplus Lancets 33G MISC TEST BLOOD SUGAR TWICE DAILY IN THE MORNING AND AT BEDTIME 200 each 12  ? ALPRAZolam (XANAX) 0.5 MG tablet Take 1 tablet (0.5 mg total) by mouth 3 (three) times daily as needed. 90 tablet 0  ? amoxicillin (AMOXIL) 500 MG tablet TAKE 4 TABLETS BY MOUTH 1 HOUR PRIOR TO DENTAL WORK, INCLUDING CLEANINGS (Patient not taking: Reported on 04/05/2022) 12 tablet 12  ? furosemide (LASIX) 20 MG tablet TAKE 1 TABLET BY MOUTH EVERY DAY (Patient not taking: Reported on 04/05/2022) 90 tablet 3  ? furosemide (LASIX) 40 MG tablet Take 1 tablet (40 mg total) by mouth daily. (Patient not taking: Reported  on 04/05/2022) 90 tablet 2  ? Omega-3 Fatty Acids (FISH OIL) 1000 MG CAPS Take 1,000 mg by mouth daily.  (Patient not taking: Reported on 04/05/2022)    ? Tdap (BOOSTRIX) 5-2.5-18.5 LF-MCG/0.5 injection Inject into the mus

## 2022-04-05 NOTE — Telephone Encounter (Signed)
Requesting: alprazolam 0.'5mg'$   ?Contract: 09/02/20 ?UDS: 09/02/20 ?Last Visit: 02/02/22 ?Next Visit 08/12/22 ?Last Refill: 03/08/22 #90 and 0RF ? ?Please Advise ? ?

## 2022-04-05 NOTE — Patient Instructions (Signed)
Medication Instructions:  ?No changes ?*If you need a refill on your cardiac medications before your next appointment, please call your pharmacy* ? ? ?Lab Work: ?None ordered ?If you have labs (blood work) drawn today and your tests are completely normal, you will receive your results only by: ?MyChart Message (if you have MyChart) OR ?A paper copy in the mail ?If you have any lab test that is abnormal or we need to change your treatment, we will call you to review the results. ? ? ?Testing/Procedures: ?Your physician has requested that you have an echocardiogram. Echocardiography is a painless test that uses sound waves to create images of your heart. It provides your doctor with information about the size and shape of your heart and how well your heart?s chambers and valves are working. You may receive an ultrasound enhancing agent through an IV if needed to better visualize your heart during the echo.This procedure takes approximately one hour. There are no restrictions for this procedure. This will take place at the [address], [address].  ? ? ? ?Follow-Up: ?At [facility], you and your health needs are our priority.  As part of our continuing mission to provide you with exceptional heart care, we have created designated Provider Care Teams.  These Care Teams include your primary Cardiologist (physician) and Advanced Practice Providers (APPs -  Physician Assistants and Nurse Practitioners) who all work together to provide you with the care you need, when you need it. ? ?We recommend signing up for the patient portal called "MyChart".  Sign up information is provided on this After Visit Summary.  MyChart is used to connect with patients for Virtual Visits (Telemedicine).  Patients are able to view lab/test results, encounter notes, upcoming appointments, etc.  Non-urgent messages can be sent to your provider as well.   ?To learn more about what you can do with MyChart, go to [email or web address].    ? ?Your next appointment:   ?12 month(s) on a device day ? ?The format for your next appointment:   ?In Person ? ?Provider:   ?Sanda Klein, MD { ? ? ? ?

## 2022-04-06 ENCOUNTER — Ambulatory Visit (INDEPENDENT_AMBULATORY_CARE_PROVIDER_SITE_OTHER): Payer: Medicare HMO

## 2022-04-06 DIAGNOSIS — I10 Essential (primary) hypertension: Secondary | ICD-10-CM

## 2022-04-06 DIAGNOSIS — E1169 Type 2 diabetes mellitus with other specified complication: Secondary | ICD-10-CM

## 2022-04-06 NOTE — Patient Instructions (Addendum)
Visit Information ? ?Thank you for taking time to visit with me today. Please don't hesitate to contact me if I can be of any further assistance to you. ? ?Following are the goals we discussed today:  ?RN Case Management Goals Met ?Patient Goals/Self-Care Activities: ?Take medications as prescribed   ?Attend all scheduled provider appointments ?use salt in moderation ?manage portion size ?Continue to eat healthy: lean meats, fruits, vegetables, avoid foods that are high in saturated fats and transfats, avoid processed foods. ?Continue to remain active ?Continue to routinely monitor your blood pressure and weights-contact provider with any questions or concerns ?Contact your provider with any health questions or concerns as needed ? ?RN Case Management Services Closed-Goals Met. ? ?If you are experiencing a Mental Health or [place] or need someone to talk to, please call the Suicide and Crisis Lifeline: 988 ?call 1-800-273-TALK (toll free, 24 hour hotline)  ? ?Patient verbalizes understanding of instructions and care plan provided today and agrees to view in [place]. Active MyChart status confirmed with patient.   ? ?Thea Silversmith, RN, MSN, BSN, CCM ?Care Management Coordinator ?[facility] ?[phone]  ?

## 2022-04-06 NOTE — Chronic Care Management (AMB) (Signed)
?Chronic Care Management  ? ?CCM RN Visit Note ? ?04/06/2022 ?Name: [name] MRN: [id number] DOB: 1945-12-19 ? ?Subjective: ?[name] is a 76 y.o. year old female who is a primary care patient of Anna Held, DO. The care management team was consulted for assistance with disease management and care coordination needs.   ? ?Engaged with patient by telephone for follow up visit in response to provider referral for case management and/or care coordination services.  ? ?Consent to Services:  ?The patient was given information about Chronic Care Management services, agreed to services, and gave verbal consent prior to initiation of services.  Please see initial visit note for detailed documentation.  ? ?Patient agreed to services and verbal consent obtained.  ? ?Assessment: Review of patient past medical history, allergies, medications, health status, including review of consultants reports, laboratory and other test data, was performed as part of comprehensive evaluation and provision of chronic care management services.  ? ?SDOH (Social Determinants of Health) assessments and interventions performed:   ? ?CCM Care Plan ? ?Allergies  ?Allergen Reactions  ? Oxycodone Itching  ? Vicodin [Hydrocodone-Acetaminophen] Itching  ? Latex Itching and Rash  ? Neomycin-Bacitracin Zn-Polymyx Other (See Comments)  ?  Red , swollen eye  ? Sulfonamide Derivatives Other (See Comments)  ?  Just had reaction when taking during pregnancy- last child birth 19  ? ? ?Outpatient Encounter Medications as of 04/06/2022  ?Medication Sig Note  ? ALPRAZolam (XANAX) 0.5 MG tablet Take 1 tablet (0.5 mg total) by mouth 3 (three) times daily as needed.   ? carvedilol (COREG) 25 MG tablet TAKE 1 TABLET BY MOUTH TWICE A DAY WITH MEALS   ? citalopram (CELEXA) 20 MG tablet TAKE 1 TABLET EVERY DAY ALONG WITH 40MG TO EQUAL 60MG TOTAL 04/05/2022: Patient 20 mg in the am  ? citalopram (CELEXA) 40 MG tablet TAKE 1 TABLET EVERY DAY ALONG WITH  20MG TO EQUAL 60MG TOTAL 04/05/2022: Patient takes 40 mg at night  ? [facility] ASPIRIN ADULT LOW DOSE 81 MG chewable tablet CHEW 1 TABLET BY MOUTH DAILY.   ? fluticasone (FLONASE) 50 MCG/ACT nasal spray USE 2 SPRAYS IN [facility] NOSTIRL EVERY DAY 06/26/2021: Reports takes as needed  ? furosemide (LASIX) 20 MG tablet TAKE 1 TABLET BY MOUTH EVERY DAY 04/06/2022: Reports takes 20 mg every day  ? furosemide (LASIX) 40 MG tablet Take 1 tablet (40 mg total) by mouth daily. 04/06/2022: Reports takes as needed  ? hyoscyamine (LEVSIN SL) 0.125 MG SL tablet TAKE 1 TABLET BY MOUTH TWICE A DAY AS NEEDED FOR CRAMPING OR DIARRHEA OR LOOSE STOOLS   ? JARDIANCE 10 MG TABS tablet TAKE 1 TABLET BY MOUTH EVERY DAY   ? losartan (COZAAR) 25 MG tablet TAKE 1/2 TABLET BY MOUTH AT BEDTIME.   ? Potassium Chloride ER 20 MEQ TBCR TAKE 1 TABLET BY MOUTH EVERY DAY   ? Probiotic Product (PRO-BIOTIC BLEND PO) Take 1 tablet by mouth daily.    ? rosuvastatin (CRESTOR) 20 MG tablet TAKE 1 TABLET BY MOUTH EVERY DAY   ? Semaglutide,0.25 or 0.5MG/DOS, (OZEMPIC, 0.25 OR 0.5 MG/DOSE,) 2 MG/3ML SOPN INJECT 0.5MG INTO THE SKIN ONCE A WEEK   ? amoxicillin (AMOXIL) 500 MG tablet TAKE 4 TABLETS BY MOUTH 1 HOUR PRIOR TO DENTAL WORK, INCLUDING CLEANINGS (Patient not taking: Reported on 04/05/2022)   ? Blood Glucose Calibration (TRUE METRIX LEVEL 1) Low SOLN 1 each by In Vitro route in the morning and at bedtime.   ?  Blood Glucose Monitoring Suppl (TRUE METRIX METER) DEVI 1 each by Does not apply route in the morning and at bedtime.   ? glucose blood (TRUE METRIX BLOOD GLUCOSE TEST) test strip Use as instructed   ? Omega-3 Fatty Acids (FISH OIL) 1000 MG CAPS Take 1,000 mg by mouth daily.  (Patient not taking: Reported on 04/05/2022)   ? Tdap (BOOSTRIX) 5-2.5-18.5 LF-MCG/0.5 injection Inject into the muscle. (Patient not taking: Reported on 04/05/2022)   ? TRUEplus Lancets 33G MISC TEST BLOOD SUGAR TWICE DAILY IN THE MORNING AND AT BEDTIME   ? Zoster Vaccine Adjuvanted [facility])  injection Inject into the muscle. (Patient not taking: Reported on 04/05/2022)   ? ?Facility-Administered Encounter Medications as of 04/06/2022  ?Medication  ? diclofenac sodium (VOLTAREN) 1 % transdermal gel 2 g  ? ? ?Patient Active Problem List  ? Diagnosis Date Noted  ? Dermatomyositis ([place]) 02/02/2022  ? Adrenal nodule ([place]) 02/02/2022  ? Preventative health care 04/30/2021  ? Hyperlipidemia associated with type 2 diabetes mellitus ([place]) 09/02/2020  ? Uncontrolled type 2 diabetes mellitus with hyperglycemia ([place]) 09/02/2020  ? Acute on chronic combined systolic and diastolic CHF (congestive heart failure) ([place]) 01/29/2020  ? S/P TAVR (transcatheter aortic valve replacement) 01/29/2020  ? Type 2 diabetes mellitus with complication (HCC)   ? Severe aortic stenosis   ? CKD (chronic kidney disease) stage 3, GFR 30-59 ml/min (HCC)   ? ICD (implantable cardioverter-defibrillator) in place 08/15/2019  ? Ventricular tachycardia ([place])   ? Anemia 06/27/2019  ? Severe obesity (BMI 35.0-35.9 with comorbidity) ([place]) 12/08/2018  ? Diabetes mellitus, type II ([place]) 04/24/2015  ? Paroxysmal atrial flutter ([place]) 10/07/2013  ? GERD 01/15/2011  ? 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 Hyperlipidemia LDL goal <100 05/23/2007  ? Anxiety 05/23/2007  ? Primary hypertension 05/23/2007  ? ? ?Conditions to be addressed/monitored:CHF, HTN, HLD, and DMII ? ?Care Plan : RN Care Manager Plan of Care  ?Updates made by Luretha Rued, RN since 04/06/2022 12:00 AM  ?Completed 04/06/2022  ? ?Problem: Chronic Disease Management Education and/or Care Coordination needs Resolved 04/06/2022  ?Priority: High  ?  ? ?Long-Range Goal: Development of plan of care for Chronic Disease Management Completed 04/06/2022  ?Start Date: 12/29/2021  ?Expected End Date: 06/28/2022  ?Priority: High  ?Note:   ?Current Barriers:  ?02/18/22 - Reports, "I am doing wonderfully  well". Blood sugar today was 110. Reports her blood sugars have been ranging 98-110. Denies any hypoglycemic episodes. She expresses concern that there is a waiting list at her local pharmacy to receive Ozempic. She reports current medication/dose received from her PCP office. She states she is following up weekly with her pharmacy to see if the medication has come in. She reports she is check blood pressure, but is not recording. She reports last blood pressure was taken yesterday, but could not remember exact number estimating her pressure was around 140/60. She continues to remain active and does some type of activity every day such as walking in the house, housework, shopping. Expressing no specific health concerns at this time. ?03/18/22 Mrs. Lockyer reports she is doing well. She states she has received Ozempic and it taking as prescribed. She reports she is able to obtain medication at this time. She reports Blood sugar range 110-150. Denies any hypoglycemic reactions. She denies any issues with blood pressure and reports she is eating healthy and  monitoring her salt intake. Mrs. Feltner reports there has been discussion of a nephrology referral and reports she has a cardiology appointment on 03/29/22. She states she would like to speak with cardiologist prior to being referred to nephrology. Mrs. Mirkin expressed she is excited to be traveling to [place] today. She denies any questions or concerns at this time.  ?04/06/22 Mrs. Sherrin reports she is doing well adding, "I feel so good". She states "Blood sugars are fabulous" - today CBG 125 and states CBG has never gone above 160 since starting Ozempic. Last A1C 6.0 down from 7.1 on 04/30/21. Cardiologist last seen on 04/05/22-no medication changes. Mrs. Cooter expresses excitement and gratitude regarding how well her health conditions are being managed and has a positive outlook on her health and life. She reports another trip to the beach this weekend for  Mother's Day. Patient thanks RNCM for engagement over the past months and is in agreement that she has met established goals. Denies any other care management or care coordination needs at this time. ? ?[place]

## 2022-04-09 ENCOUNTER — Encounter: Payer: Self-pay | Admitting: Cardiovascular Disease

## 2022-04-09 ENCOUNTER — Ambulatory Visit (HOSPITAL_COMMUNITY): Payer: Medicare HMO | Attending: Cardiology

## 2022-04-09 DIAGNOSIS — Z952 Presence of prosthetic heart valve: Secondary | ICD-10-CM

## 2022-04-09 LAB — ECHOCARDIOGRAM COMPLETE
AV Mean grad: 7 mmHg
AV Peak grad: 13 mmHg
Ao pk vel: 1.8 m/s
Area-P 1/2: 3.72 cm2
Calc EF: 44.1 %
S' Lateral: 3.7 cm
Single Plane A2C EF: 42.1 %
Single Plane A4C EF: 40.8 %

## 2022-04-16 ENCOUNTER — Encounter: Payer: Self-pay | Admitting: Family Medicine

## 2022-04-27 ENCOUNTER — Ambulatory Visit: Payer: Medicare HMO | Admitting: Pharmacist

## 2022-04-27 DIAGNOSIS — E1169 Type 2 diabetes mellitus with other specified complication: Secondary | ICD-10-CM

## 2022-04-27 DIAGNOSIS — I10 Essential (primary) hypertension: Secondary | ICD-10-CM

## 2022-04-27 DIAGNOSIS — N1832 Chronic kidney disease, stage 3b: Secondary | ICD-10-CM

## 2022-04-27 DIAGNOSIS — E1165 Type 2 diabetes mellitus with hyperglycemia: Secondary | ICD-10-CM

## 2022-04-27 NOTE — Patient Instructions (Signed)
[name] It was a pleasure speaking with you  Below is a summary of your health goals and care plan   Patient Goals/Self-Care Activities take medications as prescribed,  check glucose 1 time a day, document, and provide at future appointments,  check blood pressure 1 to 2 times per week, document, and provide at future appointments,  weigh daily, and contact provider if weight gain of more than 3 lbs in 24 hours or 5 lbs in 1 week,  collaborate with provider on medication access solutions when needed. Contact clinical pharmacist if you need assistance in future with Ozempic supply issues. target a minimum of 150 minutes of moderate intensity exercise weekly   If you have any questions or concerns, please feel free to contact me either at the phone number below or with a MyChart message.   Keep up the good work!  [name], PharmD Clinical Pharmacist [facility] [phone] (direct line)  [phone] (main office number)   Chronic Care Management Care Plan  Diabetes / Weight Management (A1c goal <7.0): Lab Results  Component Value Date   HGBA1C 6.0 02/02/2022   Wt Readings from Last 3 Encounters:  04/05/22 182 lb (82.6 kg)  02/02/22 179 lb 3.2 oz (81.3 kg)  11/02/21 180 lb (81.6 kg)   Current treatment: Jardiance 26m daily Ozempic 0.554mweekly   Interventions:  Reviewed home blood glucose readings and reviewed goals  Fasting blood glucose goal (before meals) = 80 to 130 Blood glucose goal after a meal = less than 180  Continue to limit sugar and carbohydrates (goal is 45 grams of carbohydrates per meal of less)  Continue to exercise with goal of at least 150 minutes per week. Reminded to get yearly diabetic eye exam  Hypertension / Heart Controlled; Goal - BP <130/80 and to prevent CHF exacerbations and manage symptoms BP Readings from Last 3 Encounters:  04/05/22 102/68  02/02/22 128/70  04/30/21 126/84   Current  treatment: Carvedilol 2567mwice a day Losartan 4m58mtake 0.5 tablet = 12.5mg 20mly Furosemide 20mg 21m0mg -7me 1 tablet daily depending on weight and swelling Potassium chloride 20 mEq - take 1 tablet daily  Jardiance 10mg da57m Interventions:  Continue to check weight daily and report increases of more than 3lbs in 24 hours or 5lbs in 1 week Reviewed each medication above and benefits for heart health Continue to limit sodium intake to <2300 mg per day Continue current therapy for blood pressure and heart Check kidney function in 3 to 6 months.   Hyperlipidemia: Controlled; LDL goal  <100 (last LDL was 83) Lab Results  Component Value Date   LDLCALC 78 02/02/2022   current treatment: Rosuvastatin 20mg dai53mOmega 3 Fatty Acids - 1000mg dail73mterventions:  Reviewed LDL goal Continue current therapy for lipids / heart health  Anxiety Current treatment: Citalopram 20mg and 427mtablet8make 1 tablet of each to equal total daily dose of 60mg  Alpraz76m 0.5mg up to 3 t92ms a day if needed for anxiety  Interventions:  Recommend continue current therapy for anxiety     Follow Up Plan:   Patient has met blood glucose and blood pressure goals. She has declined patient assistance program. If she changes her mind can re-engage with Clinical Pharmacist Practitioner. At this time no follow up recommended.   Patient verbalizes understanding of instructions and care plan provided today and agrees to view in MyChart. ActivHendricksrt status and patient  understanding of how to access instructions and care plan via MyChart confirmed with patient.

## 2022-04-27 NOTE — Chronic Care Management (AMB) (Signed)
  Chronic Care Management Pharmacy Note  04/27/2022 Name:  [name] MRN:  [id number] DOB:  10/11/1946  Summary: Blood glucose continues to improve after addition of Ozempic. Currently taking Ozempic 0.5mg weekly. Weight is stable. Cost of Ozempic and Jardiance is high but patient has declined to apply for patient assistance program. She will likely reach Medicare catastrophic coverage in another 1 to 2 months.  Home blood pressure has been at goal.  Patient has decided not to go to nephrologist (noted to have CKD 3B but no proteinuria). She discussed with cardiologist. She is on Jardiance and losartan which are renally protective.  Blood glucose at goal and blood pressure at goal.   Recommendations/Changes made from today's visit: Recommended she continue Ozempic at current dose of 0.5mg weekly.  Reminded to get yearly eye exam.  Continue current blood pressure medications.   Subjective: [name] is an 75 y.o. year old female who is a primary patient of Lowne Chase, Yvonne R, DO.  The CCM team was consulted for assistance with disease management and care coordination needs.    Engaged with patient by telephone for follow up visit in response to provider referral for pharmacy case management and/or care coordination services.   Consent to Services:  The patient was given information about Chronic Care Management services, agreed to services, and gave verbal consent prior to initiation of services.  Please see initial visit note for detailed documentation.   Patient Care Team: Lowne Chase, Yvonne R, DO as PCP - General (Family Medicine) Croitoru, Mihai, MD as PCP - Cardiology (Cardiology) Bartle, Bryan K, MD as Consulting Physician (Cardiothoracic Surgery) Tanner, James, DDS (Dentistry) Tanner, Michael, MD as Consulting Physician (Ophthalmology) Eckard, Tammy, RPH-CPP (Pharmacist) Cooper, Michael, MD as Consulting Physician (Cardiology)  Recent office visits: 02/02/2022  - Fam Med (Dr Lowne-Chase) F/U chronic conditions. Labs checked. Referred to nephrology. No med changes noted.  01/01/2022 - PCP (Dr Lowne-Chase) Video Visit. Home test positive for COVID. Having symptoms - fatigue, sore throat, body aches and headaches. Prescribed molnupiravir 800mg twice daily for 5 days. Also recommended OTC therapy - guaifenesin, pseutoephedrine, oxymetazoline, saline  nasal spray, fluticasone nasal sprya, warm salt gargles and pain relievers if needed for symptoms.  04/30/2021 - PCP (Dr Lowne) CPE. restarted hyoscamine as needed for abdominal pain  Recent consult visits: 04/05/2022 - Cardio (Dr Croitoru) Follow up TAVR, CHF, A.flutter. Ordered CT to check dissection of aortia. Per Dr Croitoru - "The aortic dissection is unchanged. Most importantly, the aorta is not dilated. Check again in a year" 03/02/2021 - Cardio (Dr Croitoru) F/U CHF, HTN, TVAR, dysrythmias. No med changes.  01/28/2021 - Cardio (Vanderschaaf, PAC) S/p TVAR. No med changes.   Hospital visits: None in previous 6 months  Objective:  Lab Results  Component Value Date   CREATININE 1.35 (H) 02/02/2022   CREATININE 1.38 (H) 04/30/2021   CREATININE 1.16 (H) 03/02/2021    Lab Results  Component Value Date   HGBA1C 6.0 02/02/2022   Last diabetic Eye exam:  Lab Results  Component Value Date/Time   HMDIABEYEEXA No Retinopathy 10/08/2015 12:00 AM    Last diabetic Foot exam: No results found for: HMDIABFOOTEX      Component Value Date/Time   CHOL 161 02/02/2022 0947   TRIG 150.0 (H) 02/02/2022 0947   HDL 53.40 02/02/2022 0947   CHOLHDL 3 02/02/2022 0947   VLDL 30.0 02/02/2022 0947   LDLCALC 78 02/02/2022 0947   LDLCALC 65 09/02/2020 1134   LDLDIRECT   217.7 09/13/2011 1535       Latest Ref Rng & Units 02/02/2022    9:47 AM 04/30/2021   10:32 AM 09/02/2020   11:34 AM  Hepatic Function  Total Protein 6.0 - 8.3 g/dL 7.1   6.9   7.4    Albumin 3.5 - 5.2 g/dL 4.5   4.2     AST 0 - 37 U/L 15   14   17     ALT 0 - 35 U/L 10   11   15    Alk Phosphatase 39 - 117 U/L 53   68     Total Bilirubin 0.2 - 1.2 mg/dL 0.7   0.4   0.3      Lab Results  Component Value Date/Time   TSH 0.893 06/28/2019 02:13 AM   TSH 0.986 08/22/2013 04:10 PM   TSH 0.96 12/09/2010 10:31 AM       Latest Ref Rng & Units 02/02/2022    9:47 AM 04/30/2021   10:32 AM 02/01/2020    2:13 AM  CBC  WBC 4.0 - 10.5 K/uL 6.0   5.1   4.0    Hemoglobin 12.0 - 15.0 g/dL 12.4   11.9   9.6    Hematocrit 36.0 - 46.0 % 36.3   35.2   29.5    Platelets 150.0 - 400.0 K/uL 131.0   120.0   92      Lab Results  Component Value Date/Time   VD25OH 13 (L) 12/09/2010 08:16 PM    Clinical ASCVD: No  The 10-year ASCVD risk score (Arnett DK, et al., 2019) is: 24.7%   Values used to calculate the score:     Age: 75 years     Sex: Female     Is Non-Hispanic African American: No     Diabetic: Yes     Tobacco smoker: No     Systolic Blood Pressure: 102 mmHg     Is BP treated: Yes     HDL Cholesterol: 53.4 mg/dL     Total Cholesterol: 161 mg/dL      Social History   Tobacco Use  Smoking Status Former   Types: Cigarettes   Quit date: 09/04/1993   Years since quitting: 28.6  Smokeless Tobacco Never   BP Readings from Last 3 Encounters:  04/05/22 102/68  02/02/22 128/70  04/30/21 126/84   Pulse Readings from Last 3 Encounters:  04/05/22 69  02/02/22 86  04/30/21 80   Wt Readings from Last 3 Encounters:  04/05/22 182 lb (82.6 kg)  02/02/22 179 lb 3.2 oz (81.3 kg)  11/02/21 180 lb (81.6 kg)    Assessment: Review of patient past medical history, allergies, medications, health status, including review of consultants reports, laboratory and other test data, was performed as part of comprehensive evaluation and provision of chronic care management services.   SDOH:  (Social Determinants of Health) assessments and interventions performed:      CCM Care Plan  Allergies  Allergen Reactions   Oxycodone Itching   Vicodin  [Hydrocodone-Acetaminophen] Itching   Latex Itching and Rash   Neomycin-Bacitracin Zn-Polymyx Other (See Comments)    Red , swollen eye   Sulfonamide Derivatives Other (See Comments)    Just had reaction when taking during pregnancy- last child birth 1972    Medications Reviewed Today     Reviewed by Wallace, Juana M, RN (Registered Nurse) on 04/06/22 at 1431  Med List Status: <None>   Medication Order Taking? Sig Documenting Provider   Last Dose Status Informant  ALPRAZolam (XANAX) 0.5 MG tablet [id number] Yes Take 1 tablet (0.5 mg total) by mouth 3 (three) times daily as needed. Copland, Jessica C, MD Taking Active   amoxicillin (AMOXIL) 500 MG tablet [id number] No TAKE 4 TABLETS BY MOUTH 1 HOUR PRIOR TO DENTAL WORK, INCLUDING CLEANINGS  Patient not taking: Reported on 04/05/2022   Cooper, Michael, MD Not Taking Active   Blood Glucose Calibration (TRUE METRIX LEVEL 1) Low SOLN [id number]  1 each by In Vitro route in the morning and at bedtime. Lowne Chase, Yvonne R, DO  Active   Blood Glucose Monitoring Suppl (TRUE METRIX METER) DEVI [id number]  1 each by Does not apply route in the morning and at bedtime. Lowne Chase, Yvonne R, DO  Active   carvedilol (COREG) 25 MG tablet [id number] Yes TAKE 1 TABLET BY MOUTH TWICE A DAY WITH MEALS Croitoru, Mihai, MD Taking Active   citalopram (CELEXA) 20 MG tablet [id number] Yes TAKE 1 TABLET EVERY DAY ALONG WITH 40MG TO EQUAL 60MG TOTAL Lowne Chase, Yvonne R, DO Taking Active            Med Note (SAMUELS, EBONY J   Mon Apr 05, 2022 11:32 AM) Patient 20 mg in the am  citalopram (CELEXA) 40 MG tablet [id number] Yes TAKE 1 TABLET EVERY DAY ALONG WITH 20MG TO EQUAL 60MG TOTAL Lowne Chase, Yvonne R, DO Taking Active            Med Note (SAMUELS, EBONY J   Mon Apr 05, 2022 11:32 AM) Patient takes 40 mg at night  [facility] ASPIRIN ADULT LOW DOSE 81 MG chewable tablet [id number] Yes CHEW 1 TABLET BY MOUTH DAILY. Croitoru, Mihai, MD Taking Active   diclofenac sodium (VOLTAREN) 1  % transdermal gel 2 g [id number]   Lowne Chase, Yvonne R, DO  Active   fluticasone (FLONASE) 50 MCG/ACT nasal spray [id number] Yes USE 2 SPRAYS IN EACH NOSTIRL EVERY DAY Lowne Chase, Yvonne R, DO Taking Active Self           Med Note (WALLACE, JUANA M   Fri Jun 26, 2021  3:52 PM) Reports takes as needed  furosemide (LASIX) 20 MG tablet [id number] Yes TAKE 1 TABLET BY MOUTH EVERY DAY Croitoru, Mihai, MD Taking Active            Med Note (WALLACE, JUANA M   Tue Apr 06, 2022  2:30 PM) Reports takes 20 mg every day  furosemide (LASIX) 40 MG tablet [id number] Yes Take 1 tablet (40 mg total) by mouth daily. Croitoru, Mihai, MD Taking Active            Med Note (WALLACE, JUANA M   Tue Apr 06, 2022  2:30 PM) Reports takes as needed  glucose blood (TRUE METRIX BLOOD GLUCOSE TEST) test strip [id number]  Use as instructed Lowne Chase, Yvonne R, DO  Active   hyoscyamine (LEVSIN SL) 0.125 MG SL tablet [id number] Yes TAKE 1 TABLET BY MOUTH TWICE A DAY AS NEEDED FOR CRAMPING OR DIARRHEA OR LOOSE STOOLS Lowne Chase, Yvonne R, DO Taking Active   JARDIANCE 10 MG TABS tablet [id number] Yes TAKE 1 TABLET BY MOUTH EVERY DAY Croitoru, Mihai, MD Taking Active   losartan (COZAAR) 25 MG tablet [id number] Yes TAKE 1/2 TABLET BY MOUTH AT BEDTIME. Croitoru, Mihai, MD Taking Active   Omega-3 Fatty Acids (FISH OIL) 1000 MG CAPS [id number] No Take 1,000 mg by mouth daily.   Patient not taking: Reported on 04/05/2022     [provider] Not Taking Active Self  Potassium Chloride ER 20 MEQ TBCR [id number] Yes TAKE 1 TABLET BY MOUTH EVERY DAY Croitoru, Mihai, MD Taking Active   Probiotic Product (PRO-BIOTIC BLEND PO) [id number] Yes Take 1 tablet by mouth daily.  [provider] Taking Active Self  rosuvastatin (CRESTOR) 20 MG tablet [id number] Yes TAKE 1 TABLET BY MOUTH EVERY DAY Lowne Chase, Yvonne R, DO Taking Active   Semaglutide,0.25 or 0.5MG/DOS, (OZEMPIC, 0.25 OR 0.5 MG/DOSE,) 2 MG/3ML SOPN [id number] Yes INJECT 0.5MG INTO  THE SKIN ONCE A WEEK Lowne Chase, Yvonne R, DO Taking Active   Tdap (BOOSTRIX) 5-2.5-18.5 LF-MCG/0.5 injection [id number]  Inject into the muscle.  Patient not taking: Reported on 04/05/2022   Snider, Cynthia, MD  Active   TRUEplus Lancets 33G MISC [id number]  TEST BLOOD SUGAR TWICE DAILY IN THE MORNING AND AT BEDTIME Lowne Chase, Yvonne R, DO  Active   Zoster Vaccine Adjuvanted (SHINGRIX) injection [id number]  Inject into the muscle.  Patient not taking: Reported on 04/05/2022   Snider, Cynthia, MD  Active             Patient Active Problem List   Diagnosis Date Noted   Dermatomyositis (HCC) 02/02/2022   Adrenal nodule (HCC) 02/02/2022   Preventative health care 04/30/2021   Hyperlipidemia associated with type 2 diabetes mellitus (HCC) 09/02/2020   Uncontrolled type 2 diabetes mellitus with hyperglycemia (HCC) 09/02/2020   Acute on chronic combined systolic and diastolic CHF (congestive heart failure) (HCC) 01/29/2020   S/P TAVR (transcatheter aortic valve replacement) 01/29/2020   Type 2 diabetes mellitus with complication (HCC)    Severe aortic stenosis    CKD (chronic kidney disease) stage 3, GFR 30-59 ml/min (HCC)    ICD (implantable cardioverter-defibrillator) in place 08/15/2019   Ventricular tachycardia (HCC)    Anemia 06/27/2019   Severe obesity (BMI 35.0-35.9 with comorbidity) (HCC) 12/08/2018   Diabetes mellitus, type II (HCC) 04/24/2015   Paroxysmal atrial flutter (HCC) 10/07/2013   GERD 01/15/2011   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, status post graft repair the ascending aorta 10/24/2007   Hyperlipidemia LDL goal <100 05/23/2007   Anxiety 05/23/2007   Primary hypertension 05/23/2007    Immunization History  Administered Date(s) Administered   Fluad Quad(high Dose 65+) 11/12/2019, 09/02/2020, 09/21/2021   Influenza Split 09/13/2011   Influenza Whole 09/19/2008, 09/09/2009, 10/15/2010   Influenza, High Dose Seasonal  PF 10/14/2015   Influenza,inj,Quad PF,6+ Mos 10/11/2013, 08/23/2014   Influenza-Unspecified 09/10/2016   Moderna Covid-19 Vaccine Bivalent Booster 18yrs & up 09/21/2021   Moderna SARS-COV2 Booster Vaccination 04/30/2021   Moderna Sars-Covid-2 Vaccination 01/15/2020, 02/12/2020   PFIZER(Purple Top)SARS-COV-2 Vaccination 09/29/2020   Pneumococcal Conjugate-13 10/14/2015   Pneumococcal Polysaccharide-23 01/09/2013   Td 09/01/2006   Tdap 02/02/2022   Zoster Recombinat (Shingrix) 02/02/2022   Zoster, Live 12/09/2010    Conditions to be addressed/monitored: CHF, HTN, HLD, DMII, CKD Stage 3, Anxiety, and IBS  Care Plan : General Pharmacy (Adult)  Updates made by Eckard, Tammy, RPH-CPP since 04/27/2022 12:00 AM     Problem: Type 2 DM; HDL, HTN; CHF; IBS; anxiety   Priority: High     Long-Range Goal: Pharmacy related goal for chronic conditions and medication management Completed 04/27/2022  Start Date: 06/05/2021  This Visit's Progress: On track  Recent Progress: On track  Priority: High  Note:   Current Barriers:  Unable to maintain control of type 2 DM Education needed regarding type   2 DM Education needed regarding medications and proper administration  Pharmacist Clinical Goal(s):  Over the next 90 days, patient will achieve adherence to monitoring guidelines and medication adherence to achieve therapeutic efficacy achieve control of type 2 DM as evidenced by A1c < 7.0 maintain control of HTN, CHF and hyperlipidemia  as evidenced by maintaining goals mentioned below  through collaboration with PharmD and provider.   Interventions: 1:1 collaboration with Lowne Chase, Yvonne R, DO regarding development and update of comprehensive plan of care as evidenced by provider attestation and co-signature Inter-disciplinary care team collaboration (see longitudinal plan of care) Comprehensive medication review performed; medication list updated in electronic medical record  Diabetes /  Obesity (A1c goal <7.0): Uncontrolled; Last A1c was 7.1% Current treatment: Jardiance 10mg daily Ozempic 0.5mg weekly (started 05/05/2021)   Starting weight = 193 lbs (04/2021)  Today's weight = 177lbs Total weight loss since starting Ozempic = 15lbs Patient states cost of Ozempic was increased due to being in coverage gap - last was $240 / month. Sent application for patient assistance in 2022 but patient decided she does not want to apply She has had difficulty getting [facility]. She currently has 2 doses left and has been promised to get Rx on Friday 01/08/2022 (patient has been on waiting list for 6 weeks). Our office supplied a sample in January 2023.  Current glucose readings: 99 to 125 Current meal patterns: Limiting calorie and carbohydrate intake. Notes that meal size is smaller with Ozempic.  Denies nausea today Current exercise: Walking in home about 3 times per day for about 10 to 20 minutes (total of 30 to 60 minutes per day) prior to getting COVID 01/01/2022 but plans to restart exercise when energy level returns Interventions:  Reviewed home blood glucose readings and reviewed goals  Fasting blood glucose goal (before meals) = 80 to 130 Blood glucose goal after a meal = less than 180  Continue Ozempic 0.5mg weekly. (Patient declined patient assistance program)  Patient to contact clinical pharmacist if she needs assistance in future with Ozempic supply issues. Assessed for medication assistance program for Jardiance - patient does not qualify. Also would not qualify for Farxiga PAP. (Completed at previous visit)  Continue to limit sugar and carbohydrates (goal is 45 grams of carbohydrates per meal of less)  Continue to exercise with goal of at least 150 minutes per week.  Hypertension / CHF (HFmrEF) Controlled; Goal - BP <130/80 and to prevent CHF exacerbations and manage symptoms Current treatment: Carvedilol 25mg twice a day Losartan 25mg - take  0.5 tablet = 12.5mg daily Furosemide 20mg or 40mg - take 1 tablet daily depending on weight and swelling Potassium chloride 20 mEq - take 1 tablet daily  Jardiance 10mg daily  Denies hypotensive/hypertensive symptoms Home blood pressure: 130/65 today Diastolic CHF EF = 45-50% (01/28/2021) NYHA class 1 Interventions:  Encouraged her to continue to check weight daily and report increases of more than 3lbs in 24 hours or 5lbs in 1 week Reviewed each medication above and benefits for heart health Continue current therapy for BP and heart Patient did note that cutting recent losartan in half was hard due to recent refill being round instead of oblong tablets. Recommended she ask pharmacy to get oblong tablets with next refills. If that is not an option, then could trial change ARB to valsartan 40mg daily (addressed at previous visit)   Hyperlipidemia: Controlled; LDL goal per cardiologist <100 Current treatment: Rosuvastatin 20mg daily  Omega   3 Fatty Acids - 1000mg daily Medications previously tried: none noted  Per refill history last rosuvastatin fill was 08/21/2021 for 90 day supply - improved adherence noted based on patient report and refill history. Interventions:  Reviewed LDL goal Continue current therapy for lipids / heart health  Anxiety Controlled Current treatment: Citalopram 20mg and 40mg tablet - takes 1 tablet of each to equal total daily dose of 60mg  Alprazolam 0.5mg up to 3 times a day if needed for anxiety  Not currently connected with mental health support but patient has been stable for the last year.  Interventions:  Recommended continue current therapy for anxiety  Patient Goals/Self-Care Activities Over the next 90 days, patient will:  take medications as prescribed,  check glucose 1 time a day, document, and provide at future appointments,  check blood pressure 1 to 2 times per week, document, and provide at future appointments,  weigh daily, and contact  provider if weight gain of more than 3 lbs in 24 hours or 5 lbs in 1 week,  collaborate with provider on medication access solutions when needed. Contact clinical pharmacist if you need assistance in future with Ozempic supply issues. target a minimum of 150 minutes of moderate intensity exercise weekly  Follow Up Plan: The care management team will reach out to the patient again over the next 60-90 days.       Medication Assistance: Application for Ozempic medication assistance program mailed to patient in 2022 when she was in Medicare coverage gap but she decided not to apply for medication assistance. Assessed for MAP for Jardiance and Farxiga (patient income too high for [place] and [place] program or BI cares program) in 2022.  She continues to declined patient assistance program for 2023.   Patient's preferred pharmacy is:  [id number] [place] [place] - [address] [place] [address] Phone: [phone] Fax: [phone]  [facility] - [place], [place] - [address] [place] [address] Phone: [phone] Fax: [phone]  [id number] [place], [place] - [address] [place] [address] Phone: [phone] Fax: [phone]    Follow Up Plan:   Patient has met blood glucose and blood pressure goals. She has declined patient assistance program. If she changes her mind can re-engage with Clinical Pharmacist Practitioner. At this time no follow up recommended.    Tammy Eckard, PharmD Clinical Pharmacist [facility] [phone]     

## 2022-04-28 DIAGNOSIS — Z7984 Long term (current) use of oral hypoglycemic drugs: Secondary | ICD-10-CM | POA: Diagnosis not present

## 2022-04-28 DIAGNOSIS — I110 Hypertensive heart disease with heart failure: Secondary | ICD-10-CM | POA: Diagnosis not present

## 2022-04-28 DIAGNOSIS — E1159 Type 2 diabetes mellitus with other circulatory complications: Secondary | ICD-10-CM | POA: Diagnosis not present

## 2022-04-28 DIAGNOSIS — Z87891 Personal history of nicotine dependence: Secondary | ICD-10-CM

## 2022-04-28 DIAGNOSIS — E785 Hyperlipidemia, unspecified: Secondary | ICD-10-CM | POA: Diagnosis not present

## 2022-04-28 DIAGNOSIS — I5032 Chronic diastolic (congestive) heart failure: Secondary | ICD-10-CM | POA: Diagnosis not present

## 2022-05-04 ENCOUNTER — Other Ambulatory Visit: Payer: Self-pay | Admitting: Family Medicine

## 2022-05-04 ENCOUNTER — Encounter: Payer: Self-pay | Admitting: Family Medicine

## 2022-05-04 DIAGNOSIS — F419 Anxiety disorder, unspecified: Secondary | ICD-10-CM

## 2022-05-04 MED ORDER — ALPRAZOLAM 0.5 MG PO TABS: 0.5000 mg | ORAL_TABLET | Freq: Three times a day (TID) | ORAL | 0 refills | Status: DC | PRN

## 2022-05-04 NOTE — Telephone Encounter (Signed)
Requesting: alprazolam 0.'5mg'$  Contract:09/02/20 UDS: 09/02/20 Last Visit: 02/02/22 Next Visit:08/12/22 Last Refill: 04/05/22 #90 and 0RF Pt sig; 1 tab tid prn   Please Advise

## 2022-05-24 ENCOUNTER — Ambulatory Visit (INDEPENDENT_AMBULATORY_CARE_PROVIDER_SITE_OTHER): Payer: Medicare HMO

## 2022-05-24 DIAGNOSIS — I495 Sick sinus syndrome: Secondary | ICD-10-CM | POA: Diagnosis not present

## 2022-05-26 LAB — CUP PACEART REMOTE DEVICE CHECK
Battery Remaining Longevity: 59 mo
Battery Voltage: 2.98 V
Brady Statistic AP VP Percent: 98.66 %
Brady Statistic AP VS Percent: 0 %
Brady Statistic AS VP Percent: 1.34 %
Brady Statistic AS VS Percent: 0 %
Brady Statistic RA Percent Paced: 98.65 %
Brady Statistic RV Percent Paced: 100 %
Date Time Interrogation Session: 20230626012210
HighPow Impedance: 85 Ohm
Implantable Lead Implant Date: 20071016
Implantable Lead Implant Date: 20071016
Implantable Lead Implant Date: 20200730
Implantable Lead Location: 753859
Implantable Lead Location: 753860
Implantable Lead Location: 753860
Implantable Lead Model: 4092
Implantable Lead Model: 5594
Implantable Lead Model: 6935
Implantable Pulse Generator Implant Date: 20200730
Lead Channel Impedance Value: 399 Ohm
Lead Channel Impedance Value: 456 Ohm
Lead Channel Impedance Value: 570 Ohm
Lead Channel Pacing Threshold Amplitude: 0.5 V
Lead Channel Pacing Threshold Amplitude: 0.625 V
Lead Channel Pacing Threshold Pulse Width: 0.4 ms
Lead Channel Pacing Threshold Pulse Width: 0.4 ms
Lead Channel Sensing Intrinsic Amplitude: 26.375 mV
Lead Channel Sensing Intrinsic Amplitude: 26.375 mV
Lead Channel Sensing Intrinsic Amplitude: 3 mV
Lead Channel Sensing Intrinsic Amplitude: 3 mV
Lead Channel Setting Pacing Amplitude: 1.5 V
Lead Channel Setting Pacing Amplitude: 2.5 V
Lead Channel Setting Pacing Pulse Width: 0.4 ms
Lead Channel Setting Sensing Sensitivity: 0.3 mV

## 2022-05-28 ENCOUNTER — Other Ambulatory Visit: Payer: Self-pay | Admitting: Family Medicine

## 2022-05-28 DIAGNOSIS — E1169 Type 2 diabetes mellitus with other specified complication: Secondary | ICD-10-CM

## 2022-05-29 ENCOUNTER — Other Ambulatory Visit: Payer: Self-pay | Admitting: Family Medicine

## 2022-05-30 ENCOUNTER — Other Ambulatory Visit: Payer: Self-pay | Admitting: Family Medicine

## 2022-05-30 DIAGNOSIS — E1165 Type 2 diabetes mellitus with hyperglycemia: Secondary | ICD-10-CM

## 2022-06-02 ENCOUNTER — Other Ambulatory Visit: Payer: Self-pay | Admitting: Family Medicine

## 2022-06-02 DIAGNOSIS — F419 Anxiety disorder, unspecified: Secondary | ICD-10-CM

## 2022-06-03 MED ORDER — ALPRAZOLAM 0.5 MG PO TABS: 0.5000 mg | ORAL_TABLET | Freq: Three times a day (TID) | ORAL | 0 refills | Status: DC | PRN

## 2022-06-03 NOTE — Telephone Encounter (Signed)
Requesting: xanax 0.5 mg Contract:09/02/20 UDS:09/02/20 Last Visit:02/02/22 Next Visit:08/12/22 Last Refill:05/04/22  Please Advise

## 2022-06-17 NOTE — Progress Notes (Signed)
Remote ICD transmission.   

## 2022-06-29 ENCOUNTER — Other Ambulatory Visit: Payer: Self-pay | Admitting: Cardiovascular Disease

## 2022-06-29 DIAGNOSIS — I10 Essential (primary) hypertension: Secondary | ICD-10-CM

## 2022-07-01 ENCOUNTER — Other Ambulatory Visit: Payer: Self-pay | Admitting: Family Medicine

## 2022-07-01 DIAGNOSIS — F419 Anxiety disorder, unspecified: Secondary | ICD-10-CM

## 2022-07-01 MED ORDER — ALPRAZOLAM 0.5 MG PO TABS: 0.5000 mg | ORAL_TABLET | Freq: Three times a day (TID) | ORAL | 0 refills | Status: DC | PRN

## 2022-07-01 NOTE — Telephone Encounter (Signed)
Requesting: alprazolam 0.'5mg'$  Contract:09/02/20 UDS:09/02/20 Last Visit: 02/02/22 Next Visit: 08/12/22 Last Refill: 06/03/22 #90 and 0RF  Please Advise

## 2022-07-02 ENCOUNTER — Other Ambulatory Visit: Payer: Self-pay | Admitting: Family Medicine

## 2022-07-02 DIAGNOSIS — I10 Essential (primary) hypertension: Secondary | ICD-10-CM

## 2022-07-26 ENCOUNTER — Other Ambulatory Visit: Payer: Self-pay | Admitting: Family Medicine

## 2022-07-26 DIAGNOSIS — F419 Anxiety disorder, unspecified: Secondary | ICD-10-CM

## 2022-07-26 MED ORDER — ALPRAZOLAM 0.5 MG PO TABS: 0.5000 mg | ORAL_TABLET | Freq: Three times a day (TID) | ORAL | 0 refills | Status: DC | PRN

## 2022-07-26 NOTE — Telephone Encounter (Signed)
Requesting: alprazolam 0.'5mg'$  Contract: 09/02/20 UDS: 09/02/20 Last Visit: 02/02/22 Next Visit: 08/12/22 Last Refill: 07/01/22 #90 and 0RF  Please Advise

## 2022-07-27 ENCOUNTER — Other Ambulatory Visit: Payer: Self-pay | Admitting: Cardiovascular Disease

## 2022-07-27 DIAGNOSIS — I10 Essential (primary) hypertension: Secondary | ICD-10-CM

## 2022-07-29 ENCOUNTER — Other Ambulatory Visit: Payer: Self-pay | Admitting: Cardiovascular Disease

## 2022-08-12 ENCOUNTER — Encounter: Payer: Medicare HMO | Admitting: Family Medicine

## 2022-08-23 ENCOUNTER — Ambulatory Visit (INDEPENDENT_AMBULATORY_CARE_PROVIDER_SITE_OTHER): Payer: Medicare HMO

## 2022-08-23 DIAGNOSIS — I495 Sick sinus syndrome: Secondary | ICD-10-CM | POA: Diagnosis not present

## 2022-08-24 ENCOUNTER — Other Ambulatory Visit: Payer: Self-pay | Admitting: Family Medicine

## 2022-08-24 DIAGNOSIS — F419 Anxiety disorder, unspecified: Secondary | ICD-10-CM

## 2022-08-24 LAB — CUP PACEART REMOTE DEVICE CHECK
Battery Remaining Longevity: 55 mo
Battery Voltage: 2.97 V
Brady Statistic AP VP Percent: 97.25 %
Brady Statistic AP VS Percent: 0 %
Brady Statistic AS VP Percent: 2.75 %
Brady Statistic AS VS Percent: 0 %
Brady Statistic RA Percent Paced: 97.23 %
Brady Statistic RV Percent Paced: 100 %
Date Time Interrogation Session: 20230925063526
HighPow Impedance: 82 Ohm
Implantable Lead Implant Date: 20071016
Implantable Lead Implant Date: 20071016
Implantable Lead Implant Date: 20200730
Implantable Lead Location: 753859
Implantable Lead Location: 753860
Implantable Lead Location: 753860
Implantable Lead Model: 4092
Implantable Lead Model: 5594
Implantable Lead Model: 6935
Implantable Pulse Generator Implant Date: 20200730
Lead Channel Impedance Value: 380 Ohm
Lead Channel Impedance Value: 494 Ohm
Lead Channel Impedance Value: 551 Ohm
Lead Channel Pacing Threshold Amplitude: 0.5 V
Lead Channel Pacing Threshold Amplitude: 0.625 V
Lead Channel Pacing Threshold Pulse Width: 0.4 ms
Lead Channel Pacing Threshold Pulse Width: 0.4 ms
Lead Channel Sensing Intrinsic Amplitude: 26.375 mV
Lead Channel Sensing Intrinsic Amplitude: 26.375 mV
Lead Channel Sensing Intrinsic Amplitude: 3.5 mV
Lead Channel Sensing Intrinsic Amplitude: 3.5 mV
Lead Channel Setting Pacing Amplitude: 1.5 V
Lead Channel Setting Pacing Amplitude: 2.5 V
Lead Channel Setting Pacing Pulse Width: 0.4 ms
Lead Channel Setting Sensing Sensitivity: 0.3 mV

## 2022-08-24 MED ORDER — ALPRAZOLAM 0.5 MG PO TABS: 0.5000 mg | ORAL_TABLET | Freq: Three times a day (TID) | ORAL | 0 refills | Status: DC | PRN

## 2022-08-24 NOTE — Telephone Encounter (Signed)
Requesting: alprazolam 0.'5mg'$   Contract: 09/02/20 UDS: 09/02/20 Last Visit: 02/02/22  Next Visit: 09/06/22 Last Refill: 07/26/22 #90 and 0RF  Please Advise

## 2022-08-26 ENCOUNTER — Other Ambulatory Visit: Payer: Self-pay | Admitting: Family Medicine

## 2022-08-26 DIAGNOSIS — E1165 Type 2 diabetes mellitus with hyperglycemia: Secondary | ICD-10-CM

## 2022-08-31 ENCOUNTER — Other Ambulatory Visit (INDEPENDENT_AMBULATORY_CARE_PROVIDER_SITE_OTHER): Payer: Medicare HMO

## 2022-08-31 ENCOUNTER — Other Ambulatory Visit: Payer: Self-pay

## 2022-08-31 DIAGNOSIS — E1165 Type 2 diabetes mellitus with hyperglycemia: Secondary | ICD-10-CM

## 2022-08-31 DIAGNOSIS — I10 Essential (primary) hypertension: Secondary | ICD-10-CM

## 2022-08-31 DIAGNOSIS — E1169 Type 2 diabetes mellitus with other specified complication: Secondary | ICD-10-CM

## 2022-08-31 DIAGNOSIS — E785 Hyperlipidemia, unspecified: Secondary | ICD-10-CM

## 2022-08-31 LAB — COMPREHENSIVE METABOLIC PANEL
ALT: 7 U/L (ref 0–35)
AST: 12 U/L (ref 0–37)
Albumin: 4.4 g/dL (ref 3.5–5.2)
Alkaline Phosphatase: 47 U/L (ref 39–117)
BUN: 41 mg/dL — ABNORMAL HIGH (ref 6–23)
CO2: 26 mEq/L (ref 19–32)
Calcium: 9.9 mg/dL (ref 8.4–10.5)
Chloride: 102 mEq/L (ref 96–112)
Creatinine, Ser: 1.66 mg/dL — ABNORMAL HIGH (ref 0.40–1.20)
GFR: 29.91 mL/min — ABNORMAL LOW (ref >60.00)
Glucose, Bld: 108 mg/dL — ABNORMAL HIGH (ref 70–99)
Potassium: 4.4 mEq/L (ref 3.5–5.1)
Sodium: 139 mEq/L (ref 135–145)
Total Bilirubin: 0.5 mg/dL (ref 0.2–1.2)
Total Protein: 6.9 g/dL (ref 6.0–8.3)

## 2022-08-31 LAB — LIPID PANEL
Cholesterol: 152 mg/dL (ref 0–200)
HDL: 52.4 mg/dL (ref >39.00)
LDL Cholesterol: 70 mg/dL (ref 0–99)
NonHDL: 99.51
Total CHOL/HDL Ratio: 3
Triglycerides: 150 mg/dL — ABNORMAL HIGH (ref 0.0–149.0)
VLDL: 30 mg/dL (ref 0.0–40.0)

## 2022-08-31 LAB — CBC WITH DIFFERENTIAL/PLATELET
Basophils Absolute: 0 10*3/uL (ref 0.0–0.1)
Basophils Relative: 0.4 % (ref 0.0–3.0)
Eosinophils Absolute: 0.1 10*3/uL (ref 0.0–0.7)
Eosinophils Relative: 2 % (ref 0.0–5.0)
HCT: 37.2 % (ref 36.0–46.0)
Hemoglobin: 12.6 g/dL (ref 12.0–15.0)
Lymphocytes Relative: 27.5 % (ref 12.0–46.0)
Lymphs Abs: 1.4 10*3/uL (ref 0.7–4.0)
MCHC: 33.8 g/dL (ref 30.0–36.0)
MCV: 101.9 fl — ABNORMAL HIGH (ref 78.0–100.0)
Monocytes Absolute: 0.4 10*3/uL (ref 0.1–1.0)
Monocytes Relative: 8 % (ref 3.0–12.0)
Neutro Abs: 3.1 10*3/uL (ref 1.4–7.7)
Neutrophils Relative %: 62.1 % (ref 43.0–77.0)
Platelets: 131 10*3/uL — ABNORMAL LOW (ref 150.0–400.0)
RBC: 3.65 Mil/uL — ABNORMAL LOW (ref 3.87–5.11)
RDW: 14.5 % (ref 11.5–15.5)
WBC: 5 10*3/uL (ref 4.0–10.5)

## 2022-08-31 LAB — HEMOGLOBIN A1C: Hgb A1c MFr Bld: 6 % (ref 4.6–6.5)

## 2022-08-31 NOTE — Addendum Note (Signed)
Addended by: Kelle Darting A on: 08/31/2022 10:34 AM   Modules accepted: Orders

## 2022-09-06 ENCOUNTER — Encounter: Payer: Medicare HMO | Admitting: Family Medicine

## 2022-09-08 ENCOUNTER — Other Ambulatory Visit: Payer: Self-pay

## 2022-09-08 DIAGNOSIS — N289 Disorder of kidney and ureter, unspecified: Secondary | ICD-10-CM

## 2022-09-09 NOTE — Progress Notes (Signed)
Remote ICD transmission.   

## 2022-09-17 ENCOUNTER — Telehealth: Payer: Self-pay | Admitting: Family Medicine

## 2022-09-17 MED ORDER — CITALOPRAM HYDROBROMIDE 40 MG PO TABS: ORAL_TABLET | ORAL | 1 refills | Status: DC

## 2022-09-17 MED ORDER — CITALOPRAM HYDROBROMIDE 20 MG PO TABS: ORAL_TABLET | ORAL | 1 refills | Status: DC

## 2022-09-17 NOTE — Telephone Encounter (Signed)
Medication:   citalopram (CELEXA) 20 MG tablet [[id number]]   citalopram (CELEXA) 40 MG tablet [[id number]]   Has the patient contacted their pharmacy? No. (If no, request that the patient contact the pharmacy for the refill.) (If yes, when and what did the pharmacy advise?)  Preferred Pharmacy (with phone number or street name):   [id number] [place] [address]: [phone] Fax: [phone]  Agent: Please be advised that RX refills may take up to 3 business days. We ask that you follow-up with your pharmacy.

## 2022-09-17 NOTE — Telephone Encounter (Signed)
Rxs sent

## 2022-09-20 ENCOUNTER — Encounter: Payer: Self-pay | Admitting: Cardiovascular Disease

## 2022-09-20 DIAGNOSIS — I10 Essential (primary) hypertension: Secondary | ICD-10-CM

## 2022-09-20 MED ORDER — FUROSEMIDE 40 MG PO TABS: 40.0000 mg | ORAL_TABLET | Freq: Every day | ORAL | 3 refills | Status: DC

## 2022-09-20 MED ORDER — FUROSEMIDE 20 MG PO TABS: 20.0000 mg | ORAL_TABLET | Freq: Every day | ORAL | 3 refills | Status: DC

## 2022-09-21 ENCOUNTER — Other Ambulatory Visit: Payer: Self-pay | Admitting: Family Medicine

## 2022-09-21 DIAGNOSIS — F419 Anxiety disorder, unspecified: Secondary | ICD-10-CM

## 2022-09-21 MED ORDER — ALPRAZOLAM 0.5 MG PO TABS: 0.5000 mg | ORAL_TABLET | Freq: Three times a day (TID) | ORAL | 0 refills | Status: DC | PRN

## 2022-09-21 NOTE — Telephone Encounter (Signed)
Patient comment: Please refill alprazolam. It is due Saturday, I need Friday as I am going out of town. Thank you  Requesting: alprazolam 0.'5mg'$  Contract: 09/02/20 UDS: 09/02/20 Last Visit: 02/02/22 Next Visit: 10/12/22 Last Refill: 08/24/22  Please Advise

## 2022-09-22 ENCOUNTER — Other Ambulatory Visit: Payer: Self-pay | Admitting: Cardiovascular Disease

## 2022-10-12 ENCOUNTER — Ambulatory Visit (INDEPENDENT_AMBULATORY_CARE_PROVIDER_SITE_OTHER): Payer: Medicare HMO | Admitting: Family Medicine

## 2022-10-12 ENCOUNTER — Encounter: Payer: Self-pay | Admitting: Family Medicine

## 2022-10-12 VITALS — BP 120/78 | HR 78 | Temp 97.7°F | Resp 18 | Ht 60.0 in | Wt 180.4 lb

## 2022-10-12 DIAGNOSIS — E1165 Type 2 diabetes mellitus with hyperglycemia: Secondary | ICD-10-CM

## 2022-10-12 DIAGNOSIS — I10 Essential (primary) hypertension: Secondary | ICD-10-CM

## 2022-10-12 DIAGNOSIS — Z23 Encounter for immunization: Secondary | ICD-10-CM | POA: Diagnosis not present

## 2022-10-12 DIAGNOSIS — I7101 Dissection of ascending aorta: Secondary | ICD-10-CM

## 2022-10-12 DIAGNOSIS — Z0000 Encounter for general adult medical examination without abnormal findings: Secondary | ICD-10-CM | POA: Diagnosis not present

## 2022-10-12 DIAGNOSIS — Z79899 Other long term (current) drug therapy: Secondary | ICD-10-CM | POA: Diagnosis not present

## 2022-10-12 DIAGNOSIS — E785 Hyperlipidemia, unspecified: Secondary | ICD-10-CM | POA: Diagnosis not present

## 2022-10-12 MED ORDER — OZEMPIC (0.25 OR 0.5 MG/DOSE) 2 MG/3ML ~~LOC~~ SOPN: 0.5000 mg | PEN_INJECTOR | SUBCUTANEOUS | 3 refills | Status: DC

## 2022-10-12 NOTE — Assessment & Plan Note (Signed)
Well controlled, no changes to meds. Encouraged heart healthy diet such as the DASH diet and exercise as tolerated.  °

## 2022-10-12 NOTE — Assessment & Plan Note (Signed)
Per cvts

## 2022-10-12 NOTE — Assessment & Plan Note (Signed)
Ghm utd Check labs  See AVS  

## 2022-10-12 NOTE — Assessment & Plan Note (Signed)
hgba1c to be checked, minimize simple carbs. Increase exercise as tolerated. Continue current meds  

## 2022-10-12 NOTE — Progress Notes (Signed)
Subjective:   By signing my name below, I, [name], attest that this documentation has been prepared under the direction and in the presence of Anna Held, DO. 10/12/2022    Patient ID: [name], female    DOB: [birth year], 76 y.o.   MRN: [id number]  Chief Complaint  Patient presents with   Annual Exam    HPI Patient is in today for a comprehensive physical exam.   She complains of occasional leg pain. She finds relief after walking for a while. She is not taking any medication to manage her pain at this time.  She has not taken ozempic for the past 2 weeks due to her pharmacy not having anymore to give out.  She continues following up with her cardiologist regularly. She reports having occasional panic attacks but otherwise they are mostly under control.  She denies having any fever, new muscle pain, new joint pain, new moles, congestion, sinus pain, sore throat, chest pain, palpations, cough, SOB, wheezing, n/v/d, constipation, blood in stool, dysuria, frequency, hematuria, or headaches at this time. She has no changes to family medical history. She has no recent surgical procedures to report.  She has 1 shingrix vaccine from her pharmacy. She is interested in receiving the flu vaccine.  She has an upcomming appointment with her eye doctor.    Past Medical History:  Diagnosis Date   Acute thoracic aortic dissection [facility]) 2008   emergency surgery - [place]   AICD (automatic cardioverter/defibrillator) present    Anxiety    Cancer ([place])    skin   CKD (chronic kidney disease) stage 3, GFR 30-59 ml/min (HCC)    Diabetes mellitus    Diverticulosis    GERD (gastroesophageal reflux disease)    Hemorrhoids    History of kidney stones    Hyperlipidemia    Hypertension    Kidney stones    S/P TAVR (transcatheter aortic valve replacement) 01/29/2020   s/p TAVR with a 23 mm Edwards Sapien 3 Ultra via the TA approach wtih Dr. Burt Knack and Dr. Cyndia Bent   Severe  aortic stenosis    Sinus node dysfunction (HCC)    hx of PPM   Sustained ventricular tachycardia ([place])    s/p ICD 05/2019    Past Surgical History:  Procedure Laterality Date   ABDOMINAL HYSTERECTOMY  1984   APPENDECTOMY     EYE SURGERY  2014   Rght eye   ICD IMPLANT N/A 06/28/2019   Procedure: PPM upgrade to ICD;  Surgeon: Evans Lance, MD;  Location: [place] [facility];  Service: Cardiovascular;  Laterality: N/A;   PACEMAKER GENERATOR CHANGE  2014   Medtronic adapta   PACEMAKER GENERATOR CHANGE N/A 08/29/2013   Procedure: PACEMAKER GENERATOR CHANGE;  Surgeon: Sanda Klein, MD;  Location: [place] [facility];  Service: Cardiovascular;  Laterality: N/A;   PACEMAKER INSERTION  08/2006   dual chamber Medtronic EnRhythm; r/t sinus node dysfunction    REPAIR OF ACUTE ASCENDING THORACIC AORTIC DISSECTION  2008   Dr. Servando Snare   RIGHT/LEFT HEART CATH AND CORONARY ANGIOGRAPHY N/A 01/09/2020   Procedure: RIGHT/LEFT HEART CATH AND CORONARY ANGIOGRAPHY;  Surgeon: Sherren Mocha, MD;  Location: [place] [facility];  Service: Cardiovascular;  Laterality: N/A;   TEE WITHOUT CARDIOVERSION N/A 01/29/2020   Procedure: TRANSESOPHAGEAL ECHOCARDIOGRAM (TEE);  Surgeon: Sherren Mocha, MD;  Location: [place] [facility];  Service: Open Heart Surgery;  Laterality: N/A;   THORACIC AORTIC ANEURYSM REPAIR  2000   type III  TRANSCATHETER AORTIC VALVE REPLACEMENT, TRANSAPICAL N/A 01/29/2020   Procedure: TRANSCATHETER AORTIC VALVE REPLACEMENT, TRANSAPICAL using a Edwards Sapien 3, 67m  Transcatheter Heart Valve;  Surgeon: CSherren Mocha MD;  Location: [facility]  Service: Open Heart Surgery;  Laterality: N/A;   TRANSTHORACIC ECHOCARDIOGRAM  09/20/2012   EF=>55% with mild conc LVH; LA mod dilated; RA mildly dilated; mild MR/TR/AR    Family History  Problem Relation Age of Onset   Kidney disease Mother    Heart attack Father    Heart attack Maternal Grandmother    Valvular heart disease Son        valve replacement at 89   Cancer Neg Hx     Social History   Socioeconomic History   Marital status: Widowed    Spouse name: Not on file   Number of children: 2   Years of education: Not on file   Highest education level: Not on file  Occupational History    Employer: RETIRED  Tobacco Use   Smoking status: Former    Types: Cigarettes    Quit date: 09/04/1993    Years since quitting: 29.1   Smokeless tobacco: Never  Vaping Use   Vaping Use: Never used  Substance and Sexual Activity   Alcohol use: No   Drug use: No   Sexual activity: Not Currently  Other Topics Concern   Not on file  Social History Narrative   Lives at home and husband was just moved to [place]   Social Determinants of Health   Financial Resource Strain: Low Risk  (01/05/2022)   Overall Financial Resource Strain (CARDIA)    Difficulty of Paying Living Expenses: Not very hard  Food Insecurity: No Food Insecurity (05/25/2021)   Hunger Vital Sign    Worried About Running Out of Food in the Last Year: Never true    RDisautelin the Last Year: Never true  Transportation Needs: No Transportation Needs (05/25/2021)   PRAPARE - THydrologist(Medical): No    Lack of Transportation (Non-Medical): No  Physical Activity: Insufficiently Active (10/20/2021)   Exercise Vital Sign    Days of Exercise per Week: 4 days    Minutes of Exercise per Session: 30 min  Stress: No Stress Concern Present (11/02/2021)   FGleed   Feeling of Stress : Not at all  Social Connections: Moderately Isolated (11/02/2021)   Social Connection and Isolation Panel [NHANES]    Frequency of Communication with Friends and Family: More than three times a week    Frequency of Social Gatherings with Friends and Family: Once a week    Attends Religious Services: 1 to 4 times per year    Active Member of [employer] Organizations: No    Attends CArchivist Meetings: Never    Marital Status: Widowed  Intimate Partner Violence: Not At Risk (11/02/2021)   Humiliation, Afraid, Rape, and Kick questionnaire    Fear of Current or Ex-Partner: No    Emotionally Abused: No    Physically Abused: No    Sexually Abused: No    Outpatient Medications Prior to Visit  Medication Sig Dispense Refill   ALPRAZolam (XANAX) 0.5 MG tablet Take 1 tablet (0.5 mg total) by mouth 3 (three) times daily as needed. 90 tablet 0   amoxicillin (AMOXIL) 500 MG tablet TAKE 4 TABLETS BY MOUTH 1 HOUR PRIOR TO DENTAL WORK, INCLUDING CLEANINGS 12 tablet  12   ASPIRIN LOW DOSE 81 MG chewable tablet CHEW 1 TABLET BY MOUTH EVERY DAY 90 tablet 3   Blood Glucose Calibration (TRUE METRIX LEVEL 1) Low SOLN 1 each by In Vitro route in the morning and at bedtime. 100 each 3   Blood Glucose Monitoring Suppl (TRUE METRIX METER) DEVI 1 each by Does not apply route in the morning and at bedtime. 1 each 0   carvedilol (COREG) 25 MG tablet TAKE 1 TABLET BY MOUTH TWICE A DAY WITH MEALS 180 tablet 1   citalopram (CELEXA) 20 MG tablet TAKE 1 TABLET EVERY DAY ALONG WITH 40MG TO EQUAL 60MG TOTAL 90 tablet 1   citalopram (CELEXA) 40 MG tablet TAKE 1 TABLET EVERY DAY ALONG WITH 20MG TO EQUAL 60MG TOTAL 90 tablet 1   fluticasone (FLONASE) 50 MCG/ACT nasal spray USE 2 SPRAYS IN EACH NOSTIRL EVERY DAY 16 g 1   furosemide (LASIX) 20 MG tablet Take 1 tablet (20 mg total) by mouth daily. 90 tablet 3   furosemide (LASIX) 40 MG tablet Take 1 tablet (40 mg total) by mouth daily. 90 tablet 3   glucose blood (TRUE METRIX BLOOD GLUCOSE TEST) test strip USE AS INSTRUCTED TWICE A DAY IN THE MORNING AND AT BEDTIME 200 strip 1   hyoscyamine (LEVSIN SL) 0.125 MG SL tablet TAKE 1 TABLET BY MOUTH TWICE A DAY AS NEEDED FOR CRAMPING OR DIARRHEA OR LOOSE STOOLS 180 tablet 1   JARDIANCE 10 MG TABS tablet TAKE 1 TABLET BY MOUTH EVERY DAY 30 tablet 5   losartan (COZAAR) 25 MG tablet TAKE 1/2 TABLET BY MOUTH AT BEDTIME. 45 tablet  7   Omega-3 Fatty Acids (FISH OIL) 1000 MG CAPS Take 1,000 mg by mouth daily.     Potassium Chloride ER 20 MEQ TBCR TAKE 1 TABLET BY MOUTH EVERY DAY 90 tablet 4   Probiotic Product (PRO-BIOTIC BLEND PO) Take 1 tablet by mouth daily.      rosuvastatin (CRESTOR) 20 MG tablet TAKE 1 TABLET BY MOUTH EVERY DAY 90 tablet 1   TRUEplus Lancets 33G MISC TEST BLOOD SUGAR TWICE DAILY IN THE MORNING AND AT BEDTIME 200 each 12   Semaglutide,0.25 or 0.5MG/DOS, (OZEMPIC, 0.25 OR 0.5 MG/DOSE,) 2 MG/3ML SOPN Inject 0.5 mg into the skin once a week. 3 mL 3   Tdap (BOOSTRIX) 5-2.5-18.5 LF-MCG/0.5 injection Inject into the muscle. 0.5 mL 0   Zoster Vaccine Adjuvanted [facility]) injection Inject into the muscle. 1 each 0   Facility-Administered Medications Prior to Visit  Medication Dose Route Frequency Provider Last Rate Last Admin   diclofenac sodium (VOLTAREN) 1 % transdermal gel 2 g  2 g Topical QID Carollee Herter, Everlina Gotts R, DO        Allergies  Allergen Reactions   Oxycodone Itching   Vicodin [Hydrocodone-Acetaminophen] Itching   Latex Itching and Rash   Neomycin-Bacitracin Zn-Polymyx Other (See Comments)    Red , swollen eye   Sulfonamide Derivatives Other (See Comments)    Just had reaction when taking during pregnancy- last child birth 57    Review of Systems  Constitutional:  Negative for fever.  HENT:  Negative for congestion, sinus pain and sore throat.   Eyes:  Negative for blurred vision.  Respiratory:  Negative for cough, shortness of breath and wheezing.   Cardiovascular:  Negative for chest pain and palpitations.  Gastrointestinal:  Negative for abdominal pain, constipation, diarrhea, nausea and vomiting.  Genitourinary:  Negative for dysuria, frequency and hematuria.  Musculoskeletal:  Negative for back pain.       (-)new muscle pain (-)new joint pain  Skin:  Negative for rash.       (-)New moles  Neurological:  Negative for loss of consciousness and headaches.       Objective:     Physical Exam Vitals and nursing note reviewed.  Constitutional:      General: She is not in acute distress.    Appearance: Normal appearance. She is not ill-appearing.  HENT:     Head: Normocephalic and atraumatic.     Right Ear: External ear normal.     Left Ear: External ear normal.  Eyes:     Extraocular Movements: Extraocular movements intact.     Pupils: Pupils are equal, round, and reactive to light.  Cardiovascular:     Rate and Rhythm: Normal rate and regular rhythm.     Heart sounds: Murmur (aortic stenosis murmer) heard.     No gallop.  Pulmonary:     Effort: Pulmonary effort is normal. No respiratory distress.     Breath sounds: Normal breath sounds. No wheezing or rales.  Skin:    General: Skin is warm and dry.  Neurological:     Mental Status: She is alert and oriented to person, place, and time.  Psychiatric:        Judgment: Judgment normal.     BP 120/78 (BP Location: Right Arm, Patient Position: Sitting, Cuff Size: Large)   Pulse 78   Temp 97.7 F (36.5 C) (Oral)   Resp 18   Ht 5' (1.524 m)   Wt 180 lb 6.4 oz (81.8 kg)   SpO2 98%   BMI 35.23 kg/m  Wt Readings from Last 3 Encounters:  10/12/22 180 lb 6.4 oz (81.8 kg)  04/05/22 182 lb (82.6 kg)  02/02/22 179 lb 3.2 oz (81.3 kg)    Diabetic Foot Exam - Simple   No data filed    Lab Results  Component Value Date   WBC 5.0 08/31/2022   HGB 12.6 08/31/2022   HCT 37.2 08/31/2022   PLT 131.0 (L) 08/31/2022   GLUCOSE 108 (H) 08/31/2022   CHOL 152 08/31/2022   TRIG 150.0 (H) 08/31/2022   HDL 52.40 08/31/2022   LDLDIRECT 217.7 09/13/2011   LDLCALC 70 08/31/2022   ALT 7 08/31/2022   AST 12 08/31/2022   NA 139 08/31/2022   K 4.4 08/31/2022   CL 102 08/31/2022   CREATININE 1.66 (H) 08/31/2022   BUN 41 (H) 08/31/2022   CO2 26 08/31/2022   TSH 0.893 06/28/2019   INR 1.1 01/25/2020   HGBA1C 6.0 08/31/2022   MICROALBUR 1.3 02/02/2022    Lab Results  Component Value Date   TSH 0.893  06/28/2019   Lab Results  Component Value Date   WBC 5.0 08/31/2022   HGB 12.6 08/31/2022   HCT 37.2 08/31/2022   MCV 101.9 (H) 08/31/2022   PLT 131.0 (L) 08/31/2022   Lab Results  Component Value Date   NA 139 08/31/2022   K 4.4 08/31/2022   CO2 26 08/31/2022   GLUCOSE 108 (H) 08/31/2022   BUN 41 (H) 08/31/2022   CREATININE 1.66 (H) 08/31/2022   BILITOT 0.5 08/31/2022   ALKPHOS 47 08/31/2022   AST 12 08/31/2022   ALT 7 08/31/2022   PROT 6.9 08/31/2022   ALBUMIN 4.4 08/31/2022   CALCIUM 9.9 08/31/2022   ANIONGAP 8 02/01/2020   EGFR 49 (L) 03/02/2021   GFR 29.91 (L) 08/31/2022  Lab Results  Component Value Date   CHOL 152 08/31/2022   Lab Results  Component Value Date   HDL 52.40 08/31/2022   Lab Results  Component Value Date   LDLCALC 70 08/31/2022   Lab Results  Component Value Date   TRIG 150.0 (H) 08/31/2022   Lab Results  Component Value Date   CHOLHDL 3 08/31/2022   Lab Results  Component Value Date   HGBA1C 6.0 08/31/2022       Assessment & Plan:   Problem List Items Addressed This Visit       Unprioritized   Uncontrolled type 2 diabetes mellitus with hyperglycemia ([place])    hgba1c to be checked,  minimize simple carbs. Increase exercise as tolerated. Continue current meds       Relevant Medications   Semaglutide,0.25 or 0.5MG/DOS, (OZEMPIC, 0.25 OR 0.5 MG/DOSE,) 2 MG/3ML SOPN   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, status post graft repair the ascending aorta    Per cvts      Primary hypertension    Well controlled, no changes to meds. Encouraged heart healthy diet such as the DASH diet and exercise as tolerated.        Preventative health care - Primary    Ghm utd Check labs  See AVS       Hyperlipidemia LDL goal <100    Encourage heart healthy diet such as MIND or DASH diet, increase exercise, avoid trans fats, simple carbohydrates and processed foods, consider a krill or fish  or flaxseed oil cap daily.        Other Visit Diagnoses     High risk medication use       Relevant Orders   DRUG MONITORING, PANEL 8 WITH CONFIRMATION, URINE   Need for influenza vaccination       Relevant Orders   Flu Vaccine QUAD High Dose(Fluad) (Completed)        Meds ordered this encounter  Medications   Semaglutide,0.25 or 0.5MG/DOS, (OZEMPIC, 0.25 OR 0.5 MG/DOSE,) 2 MG/3ML SOPN    Sig: Inject 0.5 mg into the skin once a week.    Dispense:  3 mL    Refill:  3    I, Anna Held, DO, personally preformed the services described in this documentation.  All medical record entries made by the scribe were at my direction and in my presence.  I have reviewed the chart and discharge instructions (if applicable) and agree that the record reflects my personal performance and is accurate and complete. 10/12/2022   I,[name],acting as a scribe for Anna Held, DO.,have documented all relevant documentation on the behalf of Anna Held, DO,as directed by  Anna Held, DO while in the presence of Anna Held, DO.   Anna Held, DO

## 2022-10-12 NOTE — Assessment & Plan Note (Signed)
Encourage heart healthy diet such as MIND or DASH diet, increase exercise, avoid trans fats, simple carbohydrates and processed foods, consider a krill or fish or flaxseed oil cap daily.  °

## 2022-10-13 ENCOUNTER — Encounter: Payer: Self-pay | Admitting: Family Medicine

## 2022-10-15 LAB — DRUG MONITORING, PANEL 8 WITH CONFIRMATION, URINE
6 Acetylmorphine: NEGATIVE ng/mL (ref <10)
Alcohol Metabolites: NEGATIVE ng/mL (ref <500)
Alphahydroxyalprazolam: 371 ng/mL — ABNORMAL HIGH (ref <25)
Alphahydroxymidazolam: NEGATIVE ng/mL (ref <50)
Alphahydroxytriazolam: NEGATIVE ng/mL (ref <50)
Aminoclonazepam: NEGATIVE ng/mL (ref <25)
Amphetamines: NEGATIVE ng/mL (ref <500)
Benzodiazepines: POSITIVE ng/mL — AB (ref <100)
Buprenorphine, Urine: NEGATIVE ng/mL (ref <5)
Cocaine Metabolite: NEGATIVE ng/mL (ref <150)
Creatinine: 85.4 mg/dL (ref >=20.0)
Hydroxyethylflurazepam: NEGATIVE ng/mL (ref <50)
Lorazepam: NEGATIVE ng/mL (ref <50)
MDMA: NEGATIVE ng/mL (ref <500)
Marijuana Metabolite: NEGATIVE ng/mL (ref <20)
Nordiazepam: NEGATIVE ng/mL (ref <50)
Opiates: NEGATIVE ng/mL (ref <100)
Oxazepam: NEGATIVE ng/mL (ref <50)
Oxidant: NEGATIVE ug/mL (ref <200)
Oxycodone: NEGATIVE ng/mL (ref <100)
Temazepam: NEGATIVE ng/mL (ref <50)
pH: 5.6 (ref 4.5–9.0)

## 2022-10-15 LAB — DM TEMPLATE

## 2022-10-24 ENCOUNTER — Other Ambulatory Visit: Payer: Self-pay | Admitting: Family Medicine

## 2022-10-24 DIAGNOSIS — F419 Anxiety disorder, unspecified: Secondary | ICD-10-CM

## 2022-10-25 ENCOUNTER — Other Ambulatory Visit: Payer: Self-pay | Admitting: Family Medicine

## 2022-10-25 DIAGNOSIS — F419 Anxiety disorder, unspecified: Secondary | ICD-10-CM

## 2022-10-25 NOTE — Telephone Encounter (Signed)
Requesting: alprazolam 0.'5mg'$   Contract:10/12/22 UDS: 10/12/22 Last Visit: 10/12/22 Next Visit: None Last Refill: 09/21/22 #90 and 0RF Pt sig: 1 tab tid prn  Please Advise

## 2022-10-25 NOTE — Telephone Encounter (Signed)
Requesting: Xanax Contract: 10/12/2022 UDS: 10/12/2022 Last Visit: 10/12/2022  Next Visit: N/A Last Refill: 09/21/2022  Please Advise

## 2022-11-01 ENCOUNTER — Telehealth: Payer: Self-pay | Admitting: Family Medicine

## 2022-11-01 NOTE — Telephone Encounter (Signed)
Copied from [place] [id number]. Topic: Medicare AWV >> Nov 01, 2022 11:00 AM Gillis Santa wrote: Reason for CRM:  LVM PATIENT TO CALL [phone] TO SCHEDULE AWVS [place]

## 2022-11-03 ENCOUNTER — Ambulatory Visit (INDEPENDENT_AMBULATORY_CARE_PROVIDER_SITE_OTHER): Payer: Medicare HMO

## 2022-11-03 VITALS — Wt 172.0 lb

## 2022-11-03 DIAGNOSIS — Z0000 Encounter for general adult medical examination without abnormal findings: Secondary | ICD-10-CM | POA: Diagnosis not present

## 2022-11-03 NOTE — Patient Instructions (Signed)
[name] , Thank you for taking time to come for your Medicare Wellness Visit. I appreciate your ongoing commitment to your health goals. Please review the following plan we discussed and let me know if I can assist you in the future.   These are the goals we discussed:  Goals      Patient Stated     Would like to lose some more weight     Patient Stated     Continue to get weight down and better balance         This is a list of the screening recommended for you and due dates:  Health Maintenance  Topic Date Due   Eye exam for diabetics  01/21/2021   Zoster (Shingles) Vaccine (2 of 2) 03/30/2022   COVID-19 Vaccine (5 - 2023-24 season) 07/30/2022   Mammogram  10/13/2023*   Yearly kidney health urinalysis for diabetes  02/03/2023   Complete foot exam   02/03/2023   Hemoglobin A1C  03/02/2023   Yearly kidney function blood test for diabetes  09/01/2023   Medicare Annual Wellness Visit  11/04/2023   DTaP/Tdap/Td vaccine (3 - Td or Tdap) 02/03/2032   Pneumonia Vaccine  Completed   Flu Shot  Completed   DEXA scan (bone density measurement)  Completed   Hepatitis C Screening: USPSTF Recommendation to screen - Ages 73-79 yo.  Completed   HPV Vaccine  Aged Out   Colon Cancer Screening  Discontinued  *Topic was postponed. The date shown is not the original due date.    Advanced directives: Please bring a copy of your health care power of attorney and living will to the office at your convenience.  Conditions/risks identified: Continue to lose weight and better balance   Next appointment: Follow up in one year for your annual wellness visit    Preventive Care 65 Years and Older, Female Preventive care refers to lifestyle choices and visits with your health care provider that can promote health and wellness. What does preventive care include? A yearly physical exam. This is also called an annual well check. Dental exams once or twice a year. Routine eye exams. Ask your  health care provider how often you should have your eyes checked. Personal lifestyle choices, including: Daily care of your teeth and gums. Regular physical activity. Eating a healthy diet. Avoiding tobacco and drug use. Limiting alcohol use. Practicing safe sex. Taking low-dose aspirin every day. Taking vitamin and mineral supplements as recommended by your health care provider. What happens during an annual well check? The services and screenings done by your health care provider during your annual well check will depend on your age, overall health, lifestyle risk factors, and family history of disease. Counseling  Your health care provider may ask you questions about your: Alcohol use. Tobacco use. Drug use. Emotional well-being. Home and relationship well-being. Sexual activity. Eating habits. History of falls. Memory and ability to understand (cognition). Work and work Statistician. Reproductive health. Screening  You may have the following tests or measurements: Height, weight, and BMI. Blood pressure. Lipid and cholesterol levels. These may be checked every 5 years, or more frequently if you are over 65 years old. Skin check. Lung cancer screening. You may have this screening every year starting at age 43 if you have a 30-pack-year history of smoking and currently smoke or have quit within the past 15 years. Fecal occult blood test (FOBT) of the stool. You may have this test every year starting at age 17.  Flexible sigmoidoscopy or colonoscopy. You may have a sigmoidoscopy every 5 years or a colonoscopy every 10 years starting at age 72. Hepatitis C blood test. Hepatitis B blood test. Sexually transmitted disease (STD) testing. Diabetes screening. This is done by checking your blood sugar (glucose) after you have not eaten for a while (fasting). You may have this done every 1-3 years. Bone density scan. This is done to screen for osteoporosis. You may have this done  starting at age 50. Mammogram. This may be done every 1-2 years. Talk to your health care provider about how often you should have regular mammograms. Talk with your health care provider about your test results, treatment options, and if necessary, the need for more tests. Vaccines  Your health care provider may recommend certain vaccines, such as: Influenza vaccine. This is recommended every year. Tetanus, diphtheria, and acellular pertussis (Tdap, Td) vaccine. You may need a Td booster every 10 years. Zoster vaccine. You may need this after age 60. Pneumococcal 13-valent conjugate (PCV13) vaccine. One dose is recommended after age 31. Pneumococcal polysaccharide (PPSV23) vaccine. One dose is recommended after age 16. Talk to your health care provider about which screenings and vaccines you need and how often you need them. This information is not intended to replace advice given to you by your health care provider. Make sure you discuss any questions you have with your health care provider. Document Released: 12/12/2015 Document Revised: 08/04/2016 Document Reviewed: 09/16/2015 Elsevier Interactive Patient Education  2017 [place] Prevention in the Home Falls can cause injuries. They can happen to people of all ages. There are many things you can do to make your home safe and to help prevent falls. What can I do on the outside of my home? Regularly fix the edges of walkways and driveways and fix any cracks. Remove anything that might make you trip as you walk through a door, such as a raised step or threshold. Trim any bushes or trees on the path to your home. Use bright outdoor lighting. Clear any walking paths of anything that might make someone trip, such as rocks or tools. Regularly check to see if handrails are loose or broken. Make sure that both sides of any steps have handrails. Any raised decks and porches should have guardrails on the edges. Have any leaves, snow, or  ice cleared regularly. Use sand or salt on walking paths during winter. Clean up any spills in your garage right away. This includes oil or grease spills. What can I do in the bathroom? Use night lights. Install grab bars by the toilet and in the tub and shower. Do not use towel bars as grab bars. Use non-skid mats or decals in the tub or shower. If you need to sit down in the shower, use a plastic, non-slip stool. Keep the floor dry. Clean up any water that spills on the floor as soon as it happens. Remove soap buildup in the tub or shower regularly. Attach bath mats securely with double-sided non-slip rug tape. Do not have throw rugs and other things on the floor that can make you trip. What can I do in the bedroom? Use night lights. Make sure that you have a light by your bed that is easy to reach. Do not use any sheets or blankets that are too big for your bed. They should not hang down onto the floor. Have a firm chair that has side arms. You can use this for support while you get  dressed. Do not have throw rugs and other things on the floor that can make you trip. What can I do in the kitchen? Clean up any spills right away. Avoid walking on wet floors. Keep items that you use a lot in easy-to-reach places. If you need to reach something above you, use a strong step stool that has a grab bar. Keep electrical cords out of the way. Do not use floor polish or wax that makes floors slippery. If you must use wax, use non-skid floor wax. Do not have throw rugs and other things on the floor that can make you trip. What can I do with my stairs? Do not leave any items on the stairs. Make sure that there are handrails on both sides of the stairs and use them. Fix handrails that are broken or loose. Make sure that handrails are as long as the stairways. Check any carpeting to make sure that it is firmly attached to the stairs. Fix any carpet that is loose or worn. Avoid having throw rugs at  the top or bottom of the stairs. If you do have throw rugs, attach them to the floor with carpet tape. Make sure that you have a light switch at the top of the stairs and the bottom of the stairs. If you do not have them, ask someone to add them for you. What else can I do to help prevent falls? Wear shoes that: Do not have high heels. Have rubber bottoms. Are comfortable and fit you well. Are closed at the toe. Do not wear sandals. If you use a stepladder: Make sure that it is fully opened. Do not climb a closed stepladder. Make sure that both sides of the stepladder are locked into place. Ask someone to hold it for you, if possible. Clearly mark and make sure that you can see: Any grab bars or handrails. First and last steps. Where the edge of each step is. Use tools that help you move around (mobility aids) if they are needed. These include: Canes. Walkers. Scooters. Crutches. Turn on the lights when you go into a dark area. Replace any light bulbs as soon as they burn out. Set up your furniture so you have a clear path. Avoid moving your furniture around. If any of your floors are uneven, fix them. If there are any pets around you, be aware of where they are. Review your medicines with your doctor. Some medicines can make you feel dizzy. This can increase your chance of falling. Ask your doctor what other things that you can do to help prevent falls. This information is not intended to replace advice given to you by your health care provider. Make sure you discuss any questions you have with your health care provider. Document Released: 09/11/2009 Document Revised: 04/22/2016 Document Reviewed: 12/20/2014 Elsevier Interactive Patient Education  2017 [employer].

## 2022-11-03 NOTE — Progress Notes (Signed)
I connected with  [name] on 11/03/22 by a audio enabled telemedicine application and verified that I am speaking with the correct person using two identifiers.  Patient Location: Home  Provider Location: Office/Clinic  I discussed the limitations of evaluation and management by telemedicine. The patient expressed understanding and agreed to proceed.   Subjective:   [name] is a 76 y.o. female who presents for Medicare Annual (Subsequent) preventive examination.  Review of Systems     Cardiac Risk Factors include: advanced age (>28mn, >>74women);dyslipidemia;diabetes mellitus;hypertension;obesity (BMI >30kg/m2)     Objective:    Today's Vitals   11/03/22 1015  Weight: 172 lb (78 kg)   Body mass index is 33.59 kg/m.     11/03/2022   10:21 AM 11/02/2021    1:52 PM 01/30/2020   11:00 AM 01/29/2020    6:38 AM 01/25/2020    9:23 AM 01/09/2020    9:16 AM 12/31/2019    1:20 PM  Advanced Directives  Does Patient Have a Medical Advance Directive? Yes Yes Yes Yes Yes Yes Yes  Type of AParamedicof AHaw[place]Living will HCoatesvilleLiving will HLa ChuparosaLiving will HSt. ClairLiving will HShumwayLiving will HWest BrownsvilleLiving will HMarquezLiving will  Does patient want to make changes to medical advance directive?   No - Patient declined   No - Patient declined   Copy of [place]in Chart? No - copy requested No - copy requested No - copy requested No - copy requested  No - copy requested     Current Medications (verified) Outpatient Encounter Medications as of 11/03/2022  Medication Sig   ALPRAZolam (XANAX) 0.5 MG tablet TAKE 1 TABLET BY MOUTH 3 TIMES DAILY AS NEEDED.   ASPIRIN LOW DOSE 81 MG chewable tablet CHEW 1 TABLET BY MOUTH EVERY DAY   Blood Glucose Calibration (TRUE METRIX LEVEL 1) Low SOLN 1 each by In Vitro  route in the morning and at bedtime.   Blood Glucose Monitoring Suppl (TRUE METRIX METER) DEVI 1 each by Does not apply route in the morning and at bedtime.   carvedilol (COREG) 25 MG tablet TAKE 1 TABLET BY MOUTH TWICE A DAY WITH MEALS   citalopram (CELEXA) 20 MG tablet TAKE 1 TABLET EVERY DAY ALONG WITH '40MG'$  TO EQUAL '60MG'$  TOTAL   citalopram (CELEXA) 40 MG tablet TAKE 1 TABLET EVERY DAY ALONG WITH '20MG'$  TO EQUAL '60MG'$  TOTAL   fluticasone (FLONASE) 50 MCG/ACT nasal spray USE 2 SPRAYS IN EACH NOSTIRL EVERY DAY   furosemide (LASIX) 20 MG tablet Take 1 tablet (20 mg total) by mouth daily.   furosemide (LASIX) 40 MG tablet Take 1 tablet (40 mg total) by mouth daily.   glucose blood (TRUE METRIX BLOOD GLUCOSE TEST) test strip USE AS INSTRUCTED TWICE A DAY IN THE MORNING AND AT BEDTIME   hyoscyamine (LEVSIN SL) 0.125 MG SL tablet TAKE 1 TABLET BY MOUTH TWICE A DAY AS NEEDED FOR CRAMPING OR DIARRHEA OR LOOSE STOOLS   JARDIANCE 10 MG TABS tablet TAKE 1 TABLET BY MOUTH EVERY DAY   losartan (COZAAR) 25 MG tablet TAKE 1/2 TABLET BY MOUTH AT BEDTIME.   Omega-3 Fatty Acids (FISH OIL) 1000 MG CAPS Take 1,000 mg by mouth daily.   Potassium Chloride ER 20 MEQ TBCR TAKE 1 TABLET BY MOUTH EVERY DAY   Probiotic Product (PRO-BIOTIC BLEND PO) Take 1 tablet by mouth daily.  rosuvastatin (CRESTOR) 20 MG tablet TAKE 1 TABLET BY MOUTH EVERY DAY   Semaglutide,0.25 or 0.'5MG'$ /DOS, (OZEMPIC, 0.25 OR 0.5 MG/DOSE,) 2 MG/3ML SOPN Inject 0.5 mg into the skin once a week.   TRUEplus Lancets 33G MISC TEST BLOOD SUGAR TWICE DAILY IN THE MORNING AND AT BEDTIME   amoxicillin (AMOXIL) 500 MG tablet TAKE 4 TABLETS BY MOUTH 1 HOUR PRIOR TO DENTAL WORK, INCLUDING CLEANINGS   Facility-Administered Encounter Medications as of 11/03/2022  Medication   diclofenac sodium (VOLTAREN) 1 % transdermal gel 2 g    Allergies (verified) Oxycodone, Vicodin [hydrocodone-acetaminophen], Latex, Neomycin-bacitracin zn-polymyx, and Sulfonamide  derivatives   History: Past Medical History:  Diagnosis Date   Acute thoracic aortic dissection ([place]) 2008   emergency surgery - [facility]   AICD (automatic cardioverter/defibrillator) present    Anxiety    Cancer ([place])    skin   CKD (chronic kidney disease) stage 3, GFR 30-59 ml/min (HCC)    Diabetes mellitus    Diverticulosis    GERD (gastroesophageal reflux disease)    Hemorrhoids    History of kidney stones    Hyperlipidemia    Hypertension    Kidney stones    S/P TAVR (transcatheter aortic valve replacement) 01/29/2020   s/p TAVR with a 23 mm Edwards Sapien 3 Ultra via the TA approach wtih Dr. Burt Knack and Dr. Cyndia Bent   Severe aortic stenosis    Sinus node dysfunction (HCC)    hx of PPM   Sustained ventricular tachycardia ([place])    s/p ICD 05/2019   Past Surgical History:  Procedure Laterality Date   ABDOMINAL HYSTERECTOMY  1984   APPENDECTOMY     EYE SURGERY  2014   Rght eye   ICD IMPLANT N/A 06/28/2019   Procedure: PPM upgrade to ICD;  Surgeon: Evans Lance, MD;  Location: [facility];  Service: Cardiovascular;  Laterality: N/A;   PACEMAKER GENERATOR CHANGE  2014   Medtronic adapta   PACEMAKER GENERATOR CHANGE N/A 08/29/2013   Procedure: PACEMAKER GENERATOR CHANGE;  Surgeon: Sanda Klein, MD;  Location: [facility];  Service: Cardiovascular;  Laterality: N/A;   PACEMAKER INSERTION  08/2006   dual chamber Medtronic EnRhythm; r/t sinus node dysfunction    REPAIR OF ACUTE ASCENDING THORACIC AORTIC DISSECTION  2008   Dr. Servando Snare   RIGHT/LEFT HEART CATH AND CORONARY ANGIOGRAPHY N/A 01/09/2020   Procedure: RIGHT/LEFT HEART CATH AND CORONARY ANGIOGRAPHY;  Surgeon: Sherren Mocha, MD;  Location: [facility];  Service: Cardiovascular;  Laterality: N/A;   TEE WITHOUT CARDIOVERSION N/A 01/29/2020   Procedure: TRANSESOPHAGEAL ECHOCARDIOGRAM (TEE);  Surgeon: Sherren Mocha, MD;  Location: [facility];  Service: Open Heart Surgery;  Laterality: N/A;   THORACIC AORTIC  ANEURYSM REPAIR  2000   type III   TRANSCATHETER AORTIC VALVE REPLACEMENT, TRANSAPICAL N/A 01/29/2020   Procedure: TRANSCATHETER AORTIC VALVE REPLACEMENT, TRANSAPICAL using a Edwards Sapien 3, 45m  Transcatheter Heart Valve;  Surgeon: CSherren Mocha MD;  [facility]  Service: Open Heart Surgery;  Laterality: N/A;   TRANSTHORACIC ECHOCARDIOGRAM  09/20/2012   EF=>55% with mild conc LVH; LA mod dilated; RA mildly dilated; mild MR/TR/AR   Family History  Problem Relation Age of Onset   Kidney disease Mother    Heart attack Father    Heart attack Maternal Grandmother    Valvular heart disease Son        valve replacement at 335  Cancer Neg Hx    Social History   Socioeconomic History  Marital status: Widowed    Spouse name: Not on file   Number of children: 2   Years of education: Not on file   Highest education level: Not on file  Occupational History    Employer: RETIRED  Tobacco Use   Smoking status: Former    Types: Cigarettes    Quit date: 09/04/1993    Years since quitting: 29.1   Smokeless tobacco: Never  Vaping Use   Vaping Use: Never used  Substance and Sexual Activity   Alcohol use: No   Drug use: No   Sexual activity: Not Currently  Other Topics Concern   Not on file  Social History Narrative   Lives at home and husband was just moved to [place]   Social Determinants of Health   Financial Resource Strain: Low Risk  (11/03/2022)   Overall Financial Resource Strain (CARDIA)    Difficulty of Paying Living Expenses: Not hard at all  Food Insecurity: No Food Insecurity (11/03/2022)   Hunger Vital Sign    Worried About Running Out of Food in the Last Year: Never true    [place] in the Last Year: Never true  Transportation Needs: No Transportation Needs (11/03/2022)   PRAPARE - Hydrologist (Medical): No    Lack of Transportation (Non-Medical): No  Physical Activity: Inactive (11/03/2022)   Exercise Vital Sign    Days  of Exercise per Week: 0 days    Minutes of Exercise per Session: 0 min  Stress: No Stress Concern Present (11/03/2022)   [place]    Feeling of Stress : Not at all  Social Connections: Moderately Isolated (11/03/2022)   Social Connection and Isolation Panel [NHANES]    Frequency of Communication with Friends and Family: More than three times a week    Frequency of Social Gatherings with Friends and Family: More than three times a week    Attends Religious Services: 1 to 4 times per year    Active Member of [employer] or Organizations: No    Attends Archivist Meetings: Never    Marital Status: Widowed    Tobacco Counseling Counseling given: Not Answered   Clinical Intake:  Pre-visit preparation completed: Yes  Pain : No/denies pain     BMI - recorded: 33.59 Nutritional Status: BMI > 30  Obese Nutritional Risks: None Diabetes: Yes CBG done?: No Did pt. bring in CBG monitor from home?: No  How often do you need to have someone help you when you read instructions, pamphlets, or other written materials from your doctor or pharmacy?: 1 - Never  Diabetic?Nutrition Risk Assessment:  Has the patient had any N/V/D within the last 2 months?  No  Does the patient have any non-healing wounds?  No  Has the patient had any unintentional weight loss or weight gain?  No   Diabetes:  Is the patient diabetic?  Yes  If diabetic, was a CBG obtained today?  No  Did the patient bring in their glucometer from home?  No  How often do you monitor your CBG's? N/A.   Financial Strains and Diabetes Management:  Are you having any financial strains with the device, your supplies or your medication? No .  Does the patient want to be seen by Chronic Care Management for management of their diabetes?  No  Would the patient like to be referred to a Nutritionist or for Diabetic Management?  No  Diabetic Exams:  Diabetic Eye  Exam: Overdue for diabetic eye exam. Pt has been advised about the importance in completing this exam. Patient advised to call and schedule an eye exam. Diabetic Foot Exam: Completed 02/02/22   Interpreter Needed?: No  Information entered by :: Charlott Rakes, LPN   Activities of Daily Living    11/03/2022   10:22 AM  In your present state of health, do you have any difficulty performing the following activities:  Hearing? 0  Vision? 0  Difficulty concentrating or making decisions? 0  Walking or climbing stairs? 0  Dressing or bathing? 0  Doing errands, shopping? 0  Preparing Food and eating ? N  Using the Toilet? N  In the past six months, have you accidently leaked urine? N  Do you have problems with loss of bowel control? N  Managing your Medications? N  Managing your Finances? N  Housekeeping or managing your Housekeeping? N    Patient Care Team: Carollee Herter, Alferd Apa, DO as PCP - General (Family Medicine) Croitoru, Dani Gobble, MD as PCP - Cardiology (Cardiology) Gaye Pollack, MD as Consulting Physician (Cardiothoracic Surgery) Royston Cowper, DDS (Dentistry) [name], MD as Consulting Physician (Ophthalmology) [name], RPH-CPP (Pharmacist) Sherren Mocha, MD as Consulting Physician (Cardiology)  Indicate any recent Medical Services you may have received from other than [facility] providers in the past year (date may be approximate).     Assessment:   This is a routine wellness examination for [facility].  Hearing/Vision screen Hearing Screening - Comments:: Pt denies any hearing issues  Vision Screening - Comments:: Pt follows up with Dr Satira Sark for annul eye exams   Dietary issues and exercise activities discussed: Current Exercise Habits: The patient does not participate in regular exercise at present   Goals Addressed             This Visit's Progress    Patient Stated       Continue to get weight down and better balance        Depression Screen     11/03/2022   10:18 AM 11/02/2021    1:59 PM 04/30/2021   10:08 AM 04/30/2021    9:57 AM 09/02/2020   11:00 AM 05/11/2018   11:25 AM 04/22/2016    2:26 PM  PHQ 2/9 Scores  PHQ - 2 Score 0 0 0 0 0 1 0  Exception Documentation       Patient refusal    Fall Risk    11/03/2022   10:22 AM 11/02/2021    1:55 PM 08/11/2021    1:32 PM 05/25/2021    1:34 PM 04/30/2021   10:09 AM  Fall Risk   Falls in the past year? 1 0 1 1 0  Comment    feet got tangled in the bed covers about three weeks ago   Number falls in past yr: 1 0 0 0 0  Injury with Fall? 0 0 1 0 0  Comment   occurred when her pacemaker "died"    Risk for fall due to : Impaired vision  History of fall(s);Medication side effect    Follow up Falls prevention discussed Falls prevention discussed Falls prevention discussed;Falls evaluation completed  Falls evaluation completed    FALL RISK PREVENTION PERTAINING TO THE HOME:  Any stairs in or around the home? No  If so, are there any without handrails? No  Home free of loose throw rugs in walkways, pet beds, electrical cords, etc? Yes  Adequate lighting in  your home to reduce risk of falls? Yes   ASSISTIVE DEVICES UTILIZED TO PREVENT FALLS:  Life alert? No  Use of a cane, walker or w/c? No  Grab bars in the bathroom? Yes  Shower chair or bench in shower? Yes  Elevated toilet seat or a handicapped toilet? Yes   TIMED UP AND GO:  Was the test performed? No .  Cognitive Function:        11/03/2022   10:23 AM  6CIT Screen  What Year? 0 points  What month? 0 points  What time? 0 points  Count back from 20 0 points  Months in reverse 0 points  Repeat phrase 0 points  Total Score 0 points    Immunizations Immunization History  Administered Date(s) Administered   Fluad Quad(high Dose 65+) 11/12/2019, 09/02/2020, 09/21/2021, 10/12/2022   Influenza Split 09/13/2011   Influenza Whole 09/19/2008, 09/09/2009, 10/15/2010   Influenza, High Dose Seasonal PF 10/14/2015    Influenza,inj,Quad PF,6+ Mos 10/11/2013, 08/23/2014   Influenza-Unspecified 09/10/2016   Moderna Covid-19 Vaccine Bivalent Booster 77yr & up 09/21/2021   Moderna SARS-COV2 Booster Vaccination 04/30/2021   Moderna Sars-Covid-2 Vaccination 01/15/2020, 02/12/2020   PFIZER(Purple Top)SARS-COV-2 Vaccination 09/29/2020   Pneumococcal Conjugate-13 10/14/2015   Pneumococcal Polysaccharide-23 01/09/2013   Td 09/01/2006   Tdap 02/02/2022   Zoster Recombinat (Shingrix) 02/02/2022   Zoster, Live 12/09/2010    TDAP status: Up to date  Flu Vaccine status: Up to date  Pneumococcal vaccine status: Up to date  Covid-19 vaccine status: Completed vaccines  Qualifies for Shingles Vaccine? Yes   Zostavax completed Yes   Shingrix Completed?: No.    Education has been provided regarding the importance of this vaccine. Patient has been advised to call insurance company to determine out of pocket expense if they have not yet received this vaccine. Advised may also receive vaccine at local pharmacy or Health Dept. Verbalized acceptance and understanding.  Screening Tests Health Maintenance  Topic Date Due   OPHTHALMOLOGY EXAM  01/21/2021   Zoster Vaccines- Shingrix (2 of 2) 03/30/2022   COVID-19 Vaccine (5 - 2023-24 season) 07/30/2022   MAMMOGRAM  10/13/2023 (Originally 09/14/2013)   Diabetic kidney evaluation - Urine ACR  02/03/2023   FOOT EXAM  02/03/2023   HEMOGLOBIN A1C  03/02/2023   Diabetic kidney evaluation - GFR measurement  09/01/2023   Medicare Annual Wellness (AWV)  11/04/2023   DTaP/Tdap/Td (3 - Td or Tdap) 02/03/2032   Pneumonia Vaccine 76 Years old  Completed   INFLUENZA VACCINE  Completed   DEXA SCAN  Completed   Hepatitis C Screening  Completed   HPV VACCINES  Aged Out   COLONOSCOPY (Pts 45-461yrInsurance coverage will need to be confirmed)  Discontinued    Health Maintenance  Health Maintenance Due  Topic Date Due   OPHTHALMOLOGY EXAM  01/21/2021   Zoster Vaccines-  Shingrix (2 of 2) 03/30/2022   COVID-19 Vaccine (5 - 2023-24 season) 07/30/2022    Colorectal cancer screening: No longer required.   Mammogram status: No longer required due to per pt .  Bone Density status: Completed 09/14/12. Results reflect: Bone density results: NORMAL. Repeat every 2 years.   Additional Screening:  Hepatitis C Screening:  Completed 04/22/16  Vision Screening: Recommended annual ophthalmology exams for early detection of glaucoma and other disorders of the eye. Is the patient up to date with their annual eye exam?  Yes  Who is the provider or what is the name of the office in which the patient  attends annual eye exams? Dr Satira Sark  If pt is not established with a provider, would they like to be referred to a provider to establish care? No .   Dental Screening: Recommended annual dental exams for proper oral hygiene  Community Resource Referral / Chronic Care Management: CRR required this visit?  No   CCM required this visit?  No      Plan:     I have personally reviewed and noted the following in the patient's chart:   Medical and social history Use of alcohol, tobacco or illicit drugs  Current medications and supplements including opioid prescriptions. Patient is not currently taking opioid prescriptions. Functional ability and status Nutritional status Physical activity Advanced directives List of other physicians Hospitalizations, surgeries, and ER visits in previous 12 months Vitals Screenings to include cognitive, depression, and falls Referrals and appointments  In addition, I have reviewed and discussed with patient certain preventive protocols, quality metrics, and best practice recommendations. A written personalized care plan for preventive services as well as general preventive health recommendations were provided to patient.     Willette Brace, LPN   13/12/4399   Nurse Notes: none

## 2022-11-12 ENCOUNTER — Other Ambulatory Visit: Payer: Self-pay | Admitting: Cardiovascular Disease

## 2022-11-12 DIAGNOSIS — I10 Essential (primary) hypertension: Secondary | ICD-10-CM

## 2022-11-23 ENCOUNTER — Ambulatory Visit (INDEPENDENT_AMBULATORY_CARE_PROVIDER_SITE_OTHER): Payer: Medicare HMO

## 2022-11-23 DIAGNOSIS — I495 Sick sinus syndrome: Secondary | ICD-10-CM | POA: Diagnosis not present

## 2022-11-24 LAB — CUP PACEART REMOTE DEVICE CHECK
Battery Remaining Longevity: 52 mo
Battery Voltage: 2.97 V
Brady Statistic AP VP Percent: 98.33 %
Brady Statistic AP VS Percent: 0.26 %
Brady Statistic AS VP Percent: 1.4 %
Brady Statistic AS VS Percent: 0.01 %
Brady Statistic RA Percent Paced: 98.5 %
Brady Statistic RV Percent Paced: 98.96 %
Date Time Interrogation Session: 20231226043705
HighPow Impedance: 69 Ohm
Implantable Lead Connection Status: 753985
Implantable Lead Connection Status: 753985
Implantable Lead Connection Status: 753985
Implantable Lead Implant Date: 20071016
Implantable Lead Implant Date: 20071016
Implantable Lead Implant Date: 20200730
Implantable Lead Location: 753859
Implantable Lead Location: 753860
Implantable Lead Location: 753860
Implantable Lead Model: 4092
Implantable Lead Model: 5594
Implantable Lead Model: 6935
Implantable Pulse Generator Implant Date: 20200730
Lead Channel Impedance Value: 380 Ohm
Lead Channel Impedance Value: 513 Ohm
Lead Channel Impedance Value: 513 Ohm
Lead Channel Pacing Threshold Amplitude: 0.5 V
Lead Channel Pacing Threshold Amplitude: 0.625 V
Lead Channel Pacing Threshold Pulse Width: 0.4 ms
Lead Channel Pacing Threshold Pulse Width: 0.4 ms
Lead Channel Sensing Intrinsic Amplitude: 2.875 mV
Lead Channel Sensing Intrinsic Amplitude: 2.875 mV
Lead Channel Sensing Intrinsic Amplitude: 26.375 mV
Lead Channel Sensing Intrinsic Amplitude: 26.375 mV
Lead Channel Setting Pacing Amplitude: 1.5 V
Lead Channel Setting Pacing Amplitude: 2.5 V
Lead Channel Setting Pacing Pulse Width: 0.4 ms
Lead Channel Setting Sensing Sensitivity: 0.3 mV
Zone Setting Status: 755011
Zone Setting Status: 755011

## 2022-11-25 ENCOUNTER — Other Ambulatory Visit: Payer: Self-pay | Admitting: Family Medicine

## 2022-11-25 DIAGNOSIS — F419 Anxiety disorder, unspecified: Secondary | ICD-10-CM

## 2022-11-26 MED ORDER — ALPRAZOLAM 0.5 MG PO TABS: 0.5000 mg | ORAL_TABLET | Freq: Three times a day (TID) | ORAL | 0 refills | Status: DC | PRN

## 2022-11-26 NOTE — Telephone Encounter (Signed)
Requesting: Xanax  Contract: 10/12/2022 UDS:10/12/2022 Last Visit:10/12/2022 Next Visit: N/A Last Refill:09/30/2022  Please Advise

## 2022-12-13 ENCOUNTER — Other Ambulatory Visit: Payer: Self-pay | Admitting: Family Medicine

## 2022-12-13 DIAGNOSIS — I10 Essential (primary) hypertension: Secondary | ICD-10-CM

## 2022-12-16 NOTE — Progress Notes (Signed)
Remote ICD transmission.   

## 2022-12-29 ENCOUNTER — Other Ambulatory Visit: Payer: Self-pay | Admitting: Family Medicine

## 2022-12-29 DIAGNOSIS — F419 Anxiety disorder, unspecified: Secondary | ICD-10-CM

## 2022-12-30 NOTE — Telephone Encounter (Signed)
Requesting: alprazolam 0.'5mg'$   Contract: 10/12/22 UDS: 10/12/22 Last Visit: 10/12/22 Next Visit: None Last Refill: 11/26/22 #90 and 0RF   Please Advise

## 2022-12-31 ENCOUNTER — Other Ambulatory Visit: Payer: Self-pay | Admitting: Family Medicine

## 2022-12-31 DIAGNOSIS — F419 Anxiety disorder, unspecified: Secondary | ICD-10-CM

## 2022-12-31 MED ORDER — ALPRAZOLAM 0.5 MG PO TABS: 0.5000 mg | ORAL_TABLET | Freq: Three times a day (TID) | ORAL | 0 refills | Status: DC | PRN

## 2023-01-03 ENCOUNTER — Other Ambulatory Visit: Payer: Self-pay | Admitting: Family Medicine

## 2023-01-03 DIAGNOSIS — E1165 Type 2 diabetes mellitus with hyperglycemia: Secondary | ICD-10-CM

## 2023-01-04 ENCOUNTER — Other Ambulatory Visit: Payer: Self-pay | Admitting: Cardiovascular Disease

## 2023-01-04 DIAGNOSIS — I5032 Chronic diastolic (congestive) heart failure: Secondary | ICD-10-CM

## 2023-01-10 ENCOUNTER — Other Ambulatory Visit: Payer: Self-pay | Admitting: Family Medicine

## 2023-01-10 ENCOUNTER — Ambulatory Visit: Payer: Self-pay | Admitting: Licensed Clinical Social Worker

## 2023-01-10 NOTE — Patient Instructions (Signed)
    It was a pleasure speaking with you today.  Care Coordination provides support specific to your health needs that extend beyond exceptional routine office care you already receive from your primary care doctor.    If you are eligible for standard Care Coordination, there is no cost to you.  The Care Coordination team is made up of the following team members: Registered Nurse Care Guide: disease management, health education, care coordination and complex case management Clinical Social Work: Complex Care Coordination including coordination of level of care needs, mental and behavioral health assessment and recommendations, and connection to long-term mental health support Clinical Pharmacist: medication management, assistance and disease management Community Resource Care Guides: Forensic psychologist Team: dedicated team of scheduling professionals to support patient and clinical team scheduling needs  Please call [phone] if you would like to schedule a phone appointment with one of the team members.   [name], [place] [phone]

## 2023-01-10 NOTE — Patient Outreach (Signed)
  Care Coordination  Initial Visit Note   01/10/2023 Name: SHARRA CAYABYAB MRN: [id number] DOB: 1946/10/01  [name] is a 77 y.o. year old female who sees Carollee Herter, Alferd Apa, DO for primary care. I spoke with  [name] by phone today.  What matters to the patients health and wellness today?   Patient reports no concerns or needs from Care Coordination team with health and wellness related to physical or mental heath. .   She is doing well at this time.   Goals Addressed             This Visit's Progress    Care Coordination Activities. No Follow up Requires       Care Coordination Interventions: Reviewed Care Coordination Services:Declined at this time Assessed Social Determinants of Health           SDOH assessments and interventions completed:  Yes  SDOH Interventions Today    Flowsheet Row Most Recent Value  SDOH Interventions   Food Insecurity Interventions Intervention Not Indicated  Housing Interventions Intervention Not Indicated  Transportation Interventions Intervention Not Indicated        Care Coordination Interventions:  Yes, provided  Interventions Today    Flowsheet Row Most Recent Value  Chronic Disease Discussed/Reviewed   Chronic disease discussed/reviewed during today's visit Other  [Anxiety]  Mental Health Interventions   Mental Health Discussed/Reviewed Mental Health Discussed       Follow up plan: No further intervention required.   Encounter Outcome:  Pt. Visit Completed   Casimer Lanius, [place] [phone]

## 2023-01-12 ENCOUNTER — Other Ambulatory Visit: Payer: Self-pay | Admitting: Family Medicine

## 2023-01-12 DIAGNOSIS — E1169 Type 2 diabetes mellitus with other specified complication: Secondary | ICD-10-CM

## 2023-01-27 ENCOUNTER — Encounter: Payer: Self-pay | Admitting: Family Medicine

## 2023-01-27 ENCOUNTER — Ambulatory Visit (INDEPENDENT_AMBULATORY_CARE_PROVIDER_SITE_OTHER): Payer: Medicare HMO | Admitting: Family Medicine

## 2023-01-27 ENCOUNTER — Other Ambulatory Visit (HOSPITAL_BASED_OUTPATIENT_CLINIC_OR_DEPARTMENT_OTHER): Payer: Self-pay

## 2023-01-27 VITALS — BP 130/80 | HR 80 | Temp 97.8°F | Resp 18 | Ht 60.0 in | Wt 179.8 lb

## 2023-01-27 DIAGNOSIS — L0291 Cutaneous abscess, unspecified: Secondary | ICD-10-CM | POA: Diagnosis not present

## 2023-01-27 DIAGNOSIS — F419 Anxiety disorder, unspecified: Secondary | ICD-10-CM

## 2023-01-27 MED ORDER — DOXYCYCLINE HYCLATE 100 MG PO TABS: 100.0000 mg | ORAL_TABLET | Freq: Two times a day (BID) | ORAL | 0 refills | Status: DC

## 2023-01-27 MED ORDER — CEFTRIAXONE SODIUM 1 G IJ SOLR
1.0000 g | Freq: Once | INTRAMUSCULAR | Status: AC
  Administered 2023-01-27: 1 g via INTRAMUSCULAR

## 2023-01-27 MED ORDER — AREXVY 120 MCG/0.5ML IM SUSR
INTRAMUSCULAR | 0 refills | Status: DC
  Filled 2023-01-27: qty 1, 1d supply, fill #0

## 2023-01-27 NOTE — Progress Notes (Addendum)
Subjective:   By signing my name below, I, [name], attest that this documentation has been prepared under the direction and in the presence of [name], DO. 01/27/2023   Patient ID: [name], female    DOB: [birth year], 77 y.o.   MRN: [id number]  Chief Complaint  Patient presents with   Cyst    X1 week, pt states cyst is on her upper back and painful, red, and swollen     HPI Patient is in today for a office visit.   She complains of possible cyst on her upper back for the past week. It is painful, red and swollen. Her pain is constant and started Monday 01/24/2023. She had a history of cyst on the same spot 40 years ago. She never had it removed but had it drained.    Past Medical History:  Diagnosis Date   Acute thoracic aortic dissection [facility]) 2008   emergency surgery - [place]   AICD (automatic cardioverter/defibrillator) present    Anxiety    Cancer ([place])    skin   CKD (chronic kidney disease) stage 3, GFR 30-59 ml/min (HCC)    Diabetes mellitus    Diverticulosis    GERD (gastroesophageal reflux disease)    Hemorrhoids    History of kidney stones    Hyperlipidemia    Hypertension    Kidney stones    S/P TAVR (transcatheter aortic valve replacement) 01/29/2020   s/p TAVR with a 23 mm Edwards Sapien 3 Ultra via the TA approach wtih Dr. Burt Knack and Dr. Cyndia Bent   Severe aortic stenosis    Sinus node dysfunction (HCC)    hx of PPM   Sustained ventricular tachycardia ([place])    s/p ICD 05/2019    Past Surgical History:  Procedure Laterality Date   ABDOMINAL HYSTERECTOMY  1984   APPENDECTOMY     EYE SURGERY  2014   Rght eye   ICD IMPLANT N/A 06/28/2019   Procedure: PPM upgrade to ICD;  Surgeon: Evans Lance, MD;  Location: [facility];  Service: Cardiovascular;  Laterality: N/A;   PACEMAKER GENERATOR CHANGE  2014   Medtronic adapta   PACEMAKER GENERATOR CHANGE N/A 08/29/2013   Procedure: PACEMAKER GENERATOR CHANGE;  Surgeon: Sanda Klein, MD;  Location: [facility];  Service: Cardiovascular;  Laterality: N/A;   PACEMAKER INSERTION  08/2006   dual chamber Medtronic EnRhythm; r/t sinus node dysfunction    REPAIR OF ACUTE ASCENDING THORACIC AORTIC DISSECTION  2008   Dr. Servando Snare   RIGHT/LEFT HEART CATH AND CORONARY ANGIOGRAPHY N/A 01/09/2020   Procedure: RIGHT/LEFT HEART CATH AND CORONARY ANGIOGRAPHY;  Surgeon: Sherren Mocha, MD;  Location: [facility];  Service: Cardiovascular;  Laterality: N/A;   TEE WITHOUT CARDIOVERSION N/A 01/29/2020   Procedure: TRANSESOPHAGEAL ECHOCARDIOGRAM (TEE);  Surgeon: Sherren Mocha, MD;  Location: [place];  Service: Open Heart Surgery;  Laterality: N/A;   THORACIC AORTIC ANEURYSM REPAIR  2000   type III   TRANSCATHETER AORTIC VALVE REPLACEMENT, TRANSAPICAL N/A 01/29/2020   Procedure: TRANSCATHETER AORTIC VALVE REPLACEMENT, TRANSAPICAL using a Edwards Sapien 3, 58m  Transcatheter Heart Valve;  Surgeon: CSherren Mocha MD;  [place] [facility]  Service: Open Heart Surgery;  Laterality: N/A;   TRANSTHORACIC ECHOCARDIOGRAM  09/20/2012   EF=>55% with mild conc LVH; LA mod dilated; RA mildly dilated; mild MR/TR/AR    Family History  Problem Relation Age of Onset   Kidney disease Mother    Heart attack Father  Heart attack Maternal Grandmother    Valvular heart disease Son        valve replacement at 75   Cancer Neg Hx     Social History   Socioeconomic History   Marital status: Widowed    Spouse name: Not on file   Number of children: 2   Years of education: Not on file   Highest education level: Not on file  Occupational History    Employer: RETIRED  Tobacco Use   Smoking status: Former    Types: Cigarettes    Quit date: 09/04/1993    Years since quitting: 29.4   Smokeless tobacco: Never  Vaping Use   Vaping Use: Never used  Substance and Sexual Activity   Alcohol use: No   Drug use: No   Sexual activity: Not Currently  Other Topics Concern   Not on file  Social  History Narrative   Lives at home and husband was just moved to [place]   Social Determinants of Health   Financial Resource Strain: Low Risk  (11/03/2022)   Overall Financial Resource Strain (CARDIA)    Difficulty of Paying Living Expenses: Not hard at all  Food Insecurity: No Food Insecurity (01/10/2023)   Hunger Vital Sign    Worried About Running Out of Food in the Last Year: Never true    [place] in the Last Year: Never true  Transportation Needs: No Transportation Needs (01/10/2023)   PRAPARE - Hydrologist (Medical): No    Lack of Transportation (Non-Medical): No  Physical Activity: Inactive (11/03/2022)   Exercise Vital Sign    Days of Exercise per Week: 0 days    Minutes of Exercise per Session: 0 min  Stress: No Stress Concern Present (11/03/2022)   [place]    Feeling of Stress : Not at all  Social Connections: Moderately Isolated (11/03/2022)   Social Connection and Isolation Panel [NHANES]    Frequency of Communication with Friends and Family: More than three times a week    Frequency of Social Gatherings with Friends and Family: More than three times a week    Attends Religious Services: 1 to 4 times per year    Active Member of [employer] or Organizations: No    Attends Archivist Meetings: Never    Marital Status: Widowed  Intimate Partner Violence: Not At Risk (11/03/2022)   Humiliation, Afraid, Rape, and Kick questionnaire    Fear of Current or Ex-Partner: No    Emotionally Abused: No    Physically Abused: No    Sexually Abused: No    Outpatient Medications Prior to Visit  Medication Sig Dispense Refill   ALPRAZolam (XANAX) 0.5 MG tablet Take 1 tablet (0.5 mg total) by mouth 3 (three) times daily as needed. 90 tablet 0   amoxicillin (AMOXIL) 500 MG tablet TAKE 4 TABLETS BY MOUTH 1 HOUR PRIOR TO DENTAL WORK, INCLUDING CLEANINGS 12 tablet 12   ASPIRIN  LOW DOSE 81 MG chewable tablet CHEW 1 TABLET BY MOUTH EVERY DAY 90 tablet 3   Blood Glucose Calibration (TRUE METRIX LEVEL 1) Low SOLN 1 each by In Vitro route in the morning and at bedtime. 100 each 3   Blood Glucose Monitoring Suppl (TRUE METRIX METER) DEVI 1 each by Does not apply route in the morning and at bedtime. 1 each 0   carvedilol (COREG) 25 MG tablet TAKE 1 TABLET BY MOUTH TWICE  A DAY WITH FOOD 180 tablet 1   citalopram (CELEXA) 20 MG tablet TAKE 1 TABLET EVERY DAY ALONG WITH '40MG'$  TO EQUAL '60MG'$  TOTAL 90 tablet 1   citalopram (CELEXA) 40 MG tablet TAKE 1 TABLET EVERY DAY ALONG WITH '20MG'$  TO EQUAL '60MG'$  TOTAL 90 tablet 1   fluticasone (FLONASE) 50 MCG/ACT nasal spray USE 2 SPRAYS IN EACH NOSTIRL EVERY DAY 16 g 1   furosemide (LASIX) 20 MG tablet Take 1 tablet (20 mg total) by mouth daily. 90 tablet 3   furosemide (LASIX) 40 MG tablet TAKE 1/2 TABLET BY MOUTH DAILY 45 tablet 3   glucose blood (TRUE METRIX BLOOD GLUCOSE TEST) test strip USE AS INSTRUCTED TWICE A DAY IN THE MORNING AND AT BEDTIME 200 strip 1   hyoscyamine (LEVSIN SL) 0.125 MG SL tablet TAKE 1 TABLET BY MOUTH TWICE A DAY AS NEEDED FOR CRAMPING OR DIARRHEA OR LOOSE STOOLS 180 tablet 1   JARDIANCE 10 MG TABS tablet TAKE 1 TABLET BY MOUTH EVERY DAY 30 tablet 5   losartan (COZAAR) 25 MG tablet TAKE 1/2 TABLET BY MOUTH EVERY DAY AT BEDTIME 45 tablet 7   Omega-3 Fatty Acids (FISH OIL) 1000 MG CAPS Take 1,000 mg by mouth daily.     Potassium Chloride ER 20 MEQ TBCR TAKE 1 TABLET BY MOUTH EVERY DAY 90 tablet 4   Probiotic Product (PRO-BIOTIC BLEND PO) Take 1 tablet by mouth daily.      rosuvastatin (CRESTOR) 20 MG tablet TAKE 1 TABLET BY MOUTH EVERY DAY 90 tablet 0   Semaglutide,0.25 or 0.'5MG'$ /DOS, (OZEMPIC, 0.25 OR 0.5 MG/DOSE,) 2 MG/3ML SOPN INJECT 0.5 MG INTO THE SKIN ONCE A WEEK. 3 mL 2   TRUEplus Lancets 33G MISC TEST BLOOD SUGAR TWICE DAILY IN THE MORNING AND AT BEDTIME 200 each 12   RSV vaccine recomb adjuvanted (AREXVY) 120  MCG/0.5ML injection Inject into the muscle. (Patient not taking: Reported on 01/27/2023) 1 each 0   Facility-Administered Medications Prior to Visit  Medication Dose Route Frequency Provider Last Rate Last Admin   diclofenac sodium (VOLTAREN) 1 % transdermal gel 2 g  2 g Topical QID Carollee Herter, Jermaine Neuharth R, DO        Allergies  Allergen Reactions   Oxycodone Itching   Vicodin [Hydrocodone-Acetaminophen] Itching   Latex Itching and Rash   Neomycin-Bacitracin Zn-Polymyx Other (See Comments)    Red , swollen eye   Sulfonamide Derivatives Other (See Comments)    Just had reaction when taking during pregnancy- last child birth 66    Review of Systems  Constitutional:  Negative for fever and malaise/fatigue.  HENT:  Negative for congestion.   Eyes:  Negative for blurred vision.  Respiratory:  Negative for shortness of breath.   Cardiovascular:  Negative for chest pain, palpitations and leg swelling.  Gastrointestinal:  Negative for abdominal pain, blood in stool and nausea.  Genitourinary:  Negative for dysuria and frequency.  Musculoskeletal:  Negative for falls.  Skin:  Negative for rash.       (+)painful cyst on upper back (+)redness and tenderness around cyst  Neurological:  Negative for dizziness, loss of consciousness and headaches.  Endo/Heme/Allergies:  Negative for environmental allergies.  Psychiatric/Behavioral:  Negative for depression. The patient is not nervous/anxious.        Objective:    Physical Exam Vitals and nursing note reviewed.  Constitutional:      General: She is not in acute distress.    Appearance: Normal appearance. She is not ill-appearing.  HENT:     Head: Normocephalic and atraumatic.     Right Ear: External ear normal.     Left Ear: External ear normal.  Eyes:     Extraocular Movements: Extraocular movements intact.     Pupils: Pupils are equal, round, and reactive to light.  Cardiovascular:     Rate and Rhythm: Normal rate and regular  rhythm.     Heart sounds: Normal heart sounds. No murmur heard.    No gallop.  Pulmonary:     Effort: Pulmonary effort is normal. No respiratory distress.     Breath sounds: Normal breath sounds. No wheezing or rales.  Skin:    General: Skin is warm and dry.     Comments: 1in redness but fluctuant around 4in abscess with extreme tenderness to touch on upper back. No drainage.   Neurological:     Mental Status: She is alert and oriented to person, place, and time.  Psychiatric:        Judgment: Judgment normal.     BP 130/80 (BP Location: Left Arm, Patient Position: Sitting, Cuff Size: Large)   Pulse 80   Temp 97.8 F (36.6 C) (Oral)   Resp 18   Ht 5' (1.524 m)   Wt 179 lb 12.8 oz (81.6 kg)   SpO2 96%   BMI 35.11 kg/m  Wt Readings from Last 3 Encounters:  01/27/23 179 lb 12.8 oz (81.6 kg)  11/03/22 172 lb (78 kg)  10/12/22 180 lb 6.4 oz (81.8 kg)       Assessment & Plan:  Abscess Assessment & Plan: Rocephin 1g IM Doxycycline 100 mg bid x 10 days  Pt to see surgery tomorrow  Warm compresses   Orders: -     Ambulatory referral to General Surgery -     Doxycycline Hyclate; Take 1 tablet (100 mg total) by mouth 2 (two) times daily.  Dispense: 20 tablet; Refill: 0    I, [name], DO, personally preformed the services described in this documentation.  All medical record entries made by the scribe were at my direction and in my presence.  I have reviewed the chart and discharge instructions (if applicable) and agree that the record reflects my personal performance and is accurate and complete. 01/27/2023   I,[name],acting as a scribe for [name], DO.,have documented all relevant documentation on the behalf of [name], DO,as directed by  [name], DO while in the presence of [name], DO.   [name], DO

## 2023-01-27 NOTE — Patient Instructions (Signed)
Skin Abscess  A skin abscess is an infected area on or under your skin. It contains pus and other material. An abscess may also be called a furuncle, carbuncle, or boil. It is often the result of an infection caused by bacteria. An abscess can occur in or on almost any part of your body. Sometimes, an abscess may break open (rupture) on its own. In most cases, it will keep getting worse unless it is treated. An abscess can cause pain and make you feel ill. An untreated abscess can cause infection to spread to other parts of your body or your bloodstream. The abscess may need to be drained. You may also need to take antibiotics. What are the causes? An abscess occurs when germs, like bacteria, pass through your skin and cause an infection. This may be caused by: A scrape or cut on your skin. A puncture wound through your skin, such as a needle injection or insect bite. Blocked oil or sweat glands. Blocked and infected hair follicles. A fluid-filled sac that forms beneath your skin (sebaceous cyst) and becomes infected. What increases the risk? You may be more likely to develop an abscess if: You have problems with blood circulation, or you have a weak body defense system (immune system). You have diabetes. You have dry and irritated skin. You get injections often or use IV drugs. You have a foreign body in a wound, such as a splinter. You smoke or use tobacco products. What are the signs or symptoms? Symptoms of this condition include: A painful, firm bump under the skin. A bump with pus at the top. This may break through the skin and drain. Other symptoms include: Redness and swelling around the abscess. Warmth or tenderness. Swelling of the lymph nodes (glands) near the abscess. A sore on the skin. How is this diagnosed? This condition may be diagnosed based on a physical exam and your medical history. You may also have tests done, such as: A test of a sample of pus. This may be done  to find what is causing the infection. Blood tests. Imaging tests, such as an ultrasound, CT scan, or MRI. How is this treated? A small abscess that drains on its own may not need to be treated. Treatment for larger abscesses may include: Moist heat or a heat pack applied to the area a few times a day. Incision and drainage. This is a procedure to drain the abscess. Antibiotics. For a severe abscess, you may first get antibiotics through an IV and then change to antibiotics by mouth. Follow these instructions at home: Medicines Take over-the-counter and prescription medicines only as told by your provider. If you were prescribed antibiotics, take them as told by your provider. Do not stop using the antibiotic even if you start to feel better. Abscess care  If you have an abscess that has not drained, apply heat to the affected area. Use the heat source that your provider recommends, such as a moist heat pack or a heating pad. Place a towel between your skin and the heat source. Leave the heat on for 20-30 minutes at a time. If your skin turns bright red, remove the heat right away to prevent burns. The risk of burns is higher if you cannot feel pain, heat, or cold. Follow instructions from your provider about how to take care of your abscess. Make sure you: Cover the abscess with a bandage (dressing). Wash your hands with soap and water for at least 20 seconds before  and after you change the dressing or gauze. If soap and water are not available, use hand sanitizer. Change your dressing or gauze as told by your provider. Check your abscess every day for signs of an infection that is getting worse. Check for: More redness, swelling, pain, or tenderness. More fluid or blood. Warmth. More pus or a worse smell. General instructions To avoid spreading the infection: Do not share personal care items, towels, or hot tubs with others. Avoid making skin contact with other people. Be careful  when getting rid of used dressings, wound packing, or any drainage from the abscess. Do not use any products that contain nicotine or tobacco. These products include cigarettes, chewing tobacco, and vaping devices, such as e-cigarettes. If you need help quitting, ask your provider. Do not use any creams, ointments, or liquids unless you have been told to by your provider. Contact a health care provider if: You see redness that spreads quickly or red streaks on your skin spreading away from the abscess. You have any signs of worse infection at the abscess. You vomit every time you eat or drink. You have a fever, chills, or muscle aches. The cyst or abscess returns. Get help right away if: You have severe pain. You make less pee (urine) than normal. This information is not intended to replace advice given to you by your health care provider. Make sure you discuss any questions you have with your health care provider. Document Revised: 06/30/2022 Document Reviewed: 06/30/2022 Elsevier Patient Education  [place].

## 2023-01-27 NOTE — Addendum Note (Signed)
Addended by: Sanda Linger on: 01/27/2023 01:14 PM   Modules accepted: Orders

## 2023-01-27 NOTE — Assessment & Plan Note (Signed)
Rocephin 1g IM Doxycycline 100 mg bid x 10 days  Pt to see surgery tomorrow  Warm compresses

## 2023-01-28 DIAGNOSIS — L089 Local infection of the skin and subcutaneous tissue, unspecified: Secondary | ICD-10-CM | POA: Diagnosis not present

## 2023-01-28 DIAGNOSIS — L723 Sebaceous cyst: Secondary | ICD-10-CM | POA: Diagnosis not present

## 2023-01-28 MED ORDER — ALPRAZOLAM 0.5 MG PO TABS: 0.5000 mg | ORAL_TABLET | Freq: Three times a day (TID) | ORAL | 0 refills | Status: DC | PRN

## 2023-01-28 NOTE — Telephone Encounter (Signed)
Requesting: Xanax Contract: 10/12/2022 UDS: 10/12/2022 Last OV: 12/31/2022, #90--0 RF Next OV: Last Refill: Database:   Please advise

## 2023-02-22 ENCOUNTER — Ambulatory Visit (INDEPENDENT_AMBULATORY_CARE_PROVIDER_SITE_OTHER): Payer: Medicare HMO

## 2023-02-22 DIAGNOSIS — I495 Sick sinus syndrome: Secondary | ICD-10-CM

## 2023-02-22 LAB — CUP PACEART REMOTE DEVICE CHECK
Battery Remaining Longevity: 49 mo
Battery Voltage: 2.97 V
Brady Statistic AP VP Percent: 97.48 %
Brady Statistic AP VS Percent: 0.39 %
Brady Statistic AS VP Percent: 2.13 %
Brady Statistic AS VS Percent: 0.01 %
Brady Statistic RA Percent Paced: 97.71 %
Brady Statistic RV Percent Paced: 98 %
Date Time Interrogation Session: 20240326022603
HighPow Impedance: 90 Ohm
Implantable Lead Connection Status: 753985
Implantable Lead Connection Status: 753985
Implantable Lead Connection Status: 753985
Implantable Lead Implant Date: 20071016
Implantable Lead Implant Date: 20071016
Implantable Lead Implant Date: 20200730
Implantable Lead Location: 753859
Implantable Lead Location: 753860
Implantable Lead Location: 753860
Implantable Lead Model: 4092
Implantable Lead Model: 5594
Implantable Lead Model: 6935
Implantable Pulse Generator Implant Date: 20200730
Lead Channel Impedance Value: 437 Ohm
Lead Channel Impedance Value: 513 Ohm
Lead Channel Impedance Value: 608 Ohm
Lead Channel Pacing Threshold Amplitude: 0.5 V
Lead Channel Pacing Threshold Amplitude: 0.875 V
Lead Channel Pacing Threshold Pulse Width: 0.4 ms
Lead Channel Pacing Threshold Pulse Width: 0.4 ms
Lead Channel Sensing Intrinsic Amplitude: 26.375 mV
Lead Channel Sensing Intrinsic Amplitude: 26.375 mV
Lead Channel Sensing Intrinsic Amplitude: 3 mV
Lead Channel Sensing Intrinsic Amplitude: 3 mV
Lead Channel Setting Pacing Amplitude: 1.75 V
Lead Channel Setting Pacing Amplitude: 2.5 V
Lead Channel Setting Pacing Pulse Width: 0.4 ms
Lead Channel Setting Sensing Sensitivity: 0.3 mV
Zone Setting Status: 755011
Zone Setting Status: 755011

## 2023-02-24 ENCOUNTER — Encounter: Payer: Self-pay | Admitting: Family Medicine

## 2023-02-24 ENCOUNTER — Encounter: Payer: Self-pay | Admitting: Cardiovascular Disease

## 2023-03-01 ENCOUNTER — Other Ambulatory Visit: Payer: Self-pay | Admitting: Family Medicine

## 2023-03-01 DIAGNOSIS — F419 Anxiety disorder, unspecified: Secondary | ICD-10-CM

## 2023-03-02 ENCOUNTER — Other Ambulatory Visit: Payer: Self-pay | Admitting: Family Medicine

## 2023-03-02 DIAGNOSIS — F419 Anxiety disorder, unspecified: Secondary | ICD-10-CM

## 2023-03-02 NOTE — Telephone Encounter (Signed)
Requesting: alprazolam 0.5mg   Contract:  10/12/22 UDS: 10/12/22 Last Visit: 01/27/23 Next Visit: None Last Refill: 01/28/23 #90 and 0RF   Please Advise

## 2023-03-03 ENCOUNTER — Telehealth: Payer: Self-pay | Admitting: Emergency Medicine

## 2023-03-03 DIAGNOSIS — I5042 Chronic combined systolic (congestive) and diastolic (congestive) heart failure: Secondary | ICD-10-CM

## 2023-03-03 DIAGNOSIS — I495 Sick sinus syndrome: Secondary | ICD-10-CM

## 2023-03-03 NOTE — Telephone Encounter (Signed)
Went over the information below with the patient. She will come in to the office to get blood work tomorrow 03/04/23. Instructed to stay well hydrated (for labs and before/after scan) and to hold furosemide and losartan on the morning of the scan. Made a f/u appt for 03/17/23 at 1000 Pt states that she hopes that the Dr will take her off of the Losartan. She said she thinks it makes her BP low and makes her have no energy. (She does not have BP readings) Instructed pt to keep a log of her BP and how she is feeling daily, so that she can discuss this with the Dr when she comes in for an appt. She verbalized understanding of all instructions.     Your cardiac CT will be scheduled at one of the below locations:   [facility] [address] [place], [place] [address] [phone]      All radiology patients and guests should use entrance C2 at [facility], accessed from [facility], even though the hospital's physical address listed is [address]       Please follow these instructions carefully (unless otherwise directed):   On the Night Before the Test: Be sure to Drink plenty of water. Do not consume any caffeinated/decaffeinated beverages or chocolate 12 hours prior to your test. Do not take any antihistamines 12 hours prior to your test.  On the Day of the Test: Drink plenty of water until 1 hour prior to the test. Do not eat any food 1 hour prior to test. You may take your regular medications prior to the test.  Hold Losartan on the morning of your test If you take Furosemide/Hydrochlorothiazide/Spironolactone, please HOLD on the morning of the test. FEMALES- please wear underwire-free bra if available, avoid dresses & tight clothing  After the Test: Drink plenty of water. After receiving IV contrast, you may experience a mild flushed feeling. This is normal. On occasion, you may experience a mild rash up to 24 hours after the  test. This is not dangerous. If this occurs, you can take Benadryl 25 mg and increase your fluid intake. If you experience trouble breathing, this can be serious. If it is severe call 911 IMMEDIATELY. If it is mild, please call our office. If you take any of these medications: Glipizide/Metformin, Avandament, Glucavance, please do not take 48 hours after completing test unless otherwise instructed.   For non-scheduling related questions, please contact the cardiac imaging nurse navigator should you have any questions/concerns: Marchia Bond, Cardiac Imaging Nurse Navigator Gordy Clement, Cardiac Imaging Nurse Navigator [facility] Direct Office Dial: [phone]   For scheduling needs, including cancellations and rescheduling, please call [place], [phone].

## 2023-03-03 NOTE — Telephone Encounter (Signed)
Croitoru, Dani Gobble, MD  Sharee Holster, RN Please order a BMET for this lady. Needs to be done before her CT angio scheduled for April 9. Please ask her to be well hydrated when she comes in for the labs and also when she goes in for the scan.  Called to go over the information above; no answer, left message with this info on it, will send mychart message too.  Order placed for BMET

## 2023-03-03 NOTE — Telephone Encounter (Signed)
Requesting: Xanax Contract: 11/23 UDS: 11/23 Last OV: 01/27/23 Next OV: N/A Last Refill:  01/28/23, #90--0 RF Database:   Please advise

## 2023-03-04 ENCOUNTER — Other Ambulatory Visit: Payer: Self-pay

## 2023-03-04 DIAGNOSIS — I5042 Chronic combined systolic (congestive) and diastolic (congestive) heart failure: Secondary | ICD-10-CM | POA: Diagnosis not present

## 2023-03-04 DIAGNOSIS — I495 Sick sinus syndrome: Secondary | ICD-10-CM | POA: Diagnosis not present

## 2023-03-05 LAB — BASIC METABOLIC PANEL
BUN/Creatinine Ratio: 19 (ref 12–28)
BUN: 23 mg/dL (ref 8–27)
CO2: 23 mmol/L (ref 20–29)
Calcium: 9.5 mg/dL (ref 8.7–10.3)
Chloride: 104 mmol/L (ref 96–106)
Creatinine, Ser: 1.19 mg/dL — ABNORMAL HIGH (ref 0.57–1.00)
Glucose: 90 mg/dL (ref 70–99)
Potassium: 4.8 mmol/L (ref 3.5–5.2)
Sodium: 140 mmol/L (ref 134–144)
eGFR: 47 mL/min/{1.73_m2} — ABNORMAL LOW (ref >59)

## 2023-03-08 ENCOUNTER — Ambulatory Visit (HOSPITAL_COMMUNITY)
Admission: RE | Admit: 2023-03-08 | Discharge: 2023-03-08 | Disposition: A | Discharge status: Home / Self Care | Payer: Medicare HMO | Source: Ambulatory Visit | Attending: Cardiovascular Disease | Admitting: Cardiovascular Disease

## 2023-03-08 DIAGNOSIS — I71019 Dissection of thoracic aorta, unspecified: Secondary | ICD-10-CM

## 2023-03-08 DIAGNOSIS — I700 Atherosclerosis of aorta: Secondary | ICD-10-CM | POA: Diagnosis not present

## 2023-03-08 MED ORDER — IOHEXOL 350 MG/ML SOLN
75.0000 mL | Freq: Once | INTRAVENOUS | Status: AC | PRN
  Administered 2023-03-08: 100 mL via INTRAVENOUS

## 2023-03-17 ENCOUNTER — Ambulatory Visit: Payer: Medicare HMO | Attending: Cardiovascular Disease | Admitting: Cardiovascular Disease

## 2023-03-17 ENCOUNTER — Encounter: Payer: Self-pay | Admitting: Cardiovascular Disease

## 2023-03-17 VITALS — BP 120/76 | HR 71 | Ht 60.0 in | Wt 178.8 lb

## 2023-03-17 DIAGNOSIS — E118 Type 2 diabetes mellitus with unspecified complications: Secondary | ICD-10-CM

## 2023-03-17 DIAGNOSIS — Z952 Presence of prosthetic heart valve: Secondary | ICD-10-CM | POA: Diagnosis not present

## 2023-03-17 DIAGNOSIS — I4892 Unspecified atrial flutter: Secondary | ICD-10-CM

## 2023-03-17 DIAGNOSIS — I441 Atrioventricular block, second degree: Secondary | ICD-10-CM

## 2023-03-17 DIAGNOSIS — Z95810 Presence of automatic (implantable) cardiac defibrillator: Secondary | ICD-10-CM

## 2023-03-17 DIAGNOSIS — I7101 Dissection of ascending aorta: Secondary | ICD-10-CM

## 2023-03-17 DIAGNOSIS — I472 Ventricular tachycardia, unspecified: Secondary | ICD-10-CM | POA: Diagnosis not present

## 2023-03-17 DIAGNOSIS — N1832 Chronic kidney disease, stage 3b: Secondary | ICD-10-CM

## 2023-03-17 DIAGNOSIS — I5042 Chronic combined systolic (congestive) and diastolic (congestive) heart failure: Secondary | ICD-10-CM | POA: Diagnosis not present

## 2023-03-17 DIAGNOSIS — I10 Essential (primary) hypertension: Secondary | ICD-10-CM | POA: Diagnosis not present

## 2023-03-17 DIAGNOSIS — I495 Sick sinus syndrome: Secondary | ICD-10-CM

## 2023-03-17 DIAGNOSIS — E7800 Pure hypercholesterolemia, unspecified: Secondary | ICD-10-CM

## 2023-03-17 MED ORDER — CARVEDILOL 12.5 MG PO TABS
12.5000 mg | ORAL_TABLET | Freq: Two times a day (BID) | ORAL | 3 refills | Status: AC
Start: 2023-03-17 — End: ?

## 2023-03-17 NOTE — Patient Instructions (Signed)
Medication Instructions:  Decrease Carvedilol to 12.5 mg twice a day *If you need a refill on your cardiac medications before your next appointment, please call your pharmacy*  Testing/Procedures: Your physician has requested that you have an echocardiogram. Echocardiography is a painless test that uses sound waves to create images of your heart. It provides your doctor with information about the size and shape of your heart and how well your heart's chambers and valves are working. This procedure takes approximately one hour. There are no restrictions for this procedure. Please do NOT wear cologne, perfume, aftershave, or lotions (deodorant is allowed). Please arrive 15 minutes prior to your appointment time.    Follow-Up: At [facility], you and your health needs are our priority.  As part of our continuing mission to provide you with exceptional heart care, we have created designated Provider Care Teams.  These Care Teams include your primary Cardiologist (physician) and Advanced Practice Providers (APPs -  Physician Assistants and Nurse Practitioners) who all work together to provide you with the care you need, when you need it.  We recommend signing up for the patient portal called "MyChart".  Sign up information is provided on this After Visit Summary.  MyChart is used to connect with patients for Virtual Visits (Telemedicine).  Patients are able to view lab/test results, encounter notes, upcoming appointments, etc.  Non-urgent messages can be sent to your provider as well.   To learn more about what you can do with MyChart, go to [email or web address].    Your next appointment:   1 year(s)-Phys Pacer Check  Provider:   Thurmon Fair, MD

## 2023-03-17 NOTE — Progress Notes (Signed)
Cardiology Office Note    Date:  03/20/2023   ID:  Anna, [name] [birth year], MRN [id number]  PCP:  Zola Button, Grayling Congress, DO  Cardiologist:   Thurmon Fair, MD   Chief Complaint  Patient presents with   Congestive Heart Failure    History of Present Illness:  [name] is a 77 y.o. female with a history of repaired type A aortic dissection (resuspended native aortic valve, persistent intimal flap involving the aortic arch, left subclavian artery and abdominal aorta), subsequent development of severe paradoxical low flow low gradient AS/AI and TAVR with 23 mm Edwards Sapien 3 Ultra THV via the TA approach on 01/29/20, chronic predominantly diastolic heart failure (EF 45%), sustained monomorphic ventricular tachycardia status post ICD implantation August 2020 (Medtronic [place]), pre-existing sinus node dysfunction with sinus node arrest, "atrially-pacemaker dependent", atypical LBBB with very long AV conduction time and 100% V pacing, history of morbid obesity, diabetes mellitus, hypertension.  She had normal coronary arteries by CT angiography in August 2020.  She was feeling very sluggish, lacking energy and decreased her carvedilol dose to 12.5 mg twice daily.  On this dose she feels terrific.  Her blood pressure remains very well-controlled around 120s/70s.  She still feels that she has good quality of life and enjoys being very engaged socially.  The patient specifically denies any chest pain at rest or with exertion, dyspnea at rest or with exertion, orthopnea, paroxysmal nocturnal dyspnea, syncope, palpitations, focal neurological deficits, intermittent claudication, lower extremity edema, unexplained weight gain, cough, hemoptysis or wheezing.  She has not had any falls, serious injuries or bleeding problems.  She takes furosemide 40 mg roughly once a week but does not require it on a daily basis after we started SGLT2 inhibitors.  Has not had any problems with urinary tract  infections open genital yeast infections on Jardiance.  CT angiogram of the aorta on 03/08/2023 shows a stable aortic dissection extending from the innominate artery all the way to the left common iliac and external iliac arteries.  The aorta remains normal in caliber.  Incidental findings of mild aortic atherosclerosis, unchanged right adrenal adenoma, sigmoid diverticulosis, sliding hiatal hernia are unchanged.  She most recently had a CT angiogram of the aorta on 03/11/2022 that showed stable aortic dissection from the origin of the innominate artery all the way to the abdominal aorta to the left common iliac and external iliac arteries (also extending into the left renal artery), but without evidence of dilation of the aorta.  The findings of aortic atherosclerosis, right adrenal adenoma, sigmoid diverticulosis, nonobstructive left nephrolithiasis and a sliding-type hiatal hernia were reported.  Her defibrillator was implanted and is followed by Dr. Ladona Ridgel.  Estimated generator longevity is about 4 years.  She has 97.5% atrial pacing with good heart rate histogram distribution and 99% ventricular pacing.  There have been no episodes of atrial fibrillation or ventricular tachycardia.  ICD interrogation shows normal function.  Estimated generator longevity is 5.4 years.  She has 99.8% atrial pacing and 99.9% ventricular pacing.  There have been no episodes of sustained or nonsustained VT and no atrial fibrillation.  Heart rate histograms are appropriate for her sedentary lifestyle (activity level is only 0.7 hours/day).  Her OptiVol is slightly out of range, but is quickly trending back to normal.  (She blames this on a recent trip to the beach, less discretion with sodium intake.).  Her pacemaker was upgraded to ICD in August 2020. AAIR-DDDR programming was associated with  only 20% V pacing, but MVP related pauses were associated with long-short sequences and precipitation of polymorphic VT.  Reprogrammed DDDR, she has 100% V pacing and a subsequent slight reduction in LVEF, but without exacerbation of clinical CHF. No recurrent VT. Last saw Dr. Ladona Ridgel in March 2021, when her AV delay was tightened to a physiological range.   Her most recent echocardiogram was performed on 04/09/2022 and shows mildly decreased LVEF at 45%, largely due to pacing induced dyssynchrony stable mean TAVR gradient at 70 mmHg, no perivalvular leak .  Unable to estimate PA pressure.  She is on high-dose carvedilol as well as a relatively low dose of losartan and Jardiance for CHF.  She takes furosemide intermittently in a very low-dose.  On statin.  Glycemic control is excellent with a hemoglobin A1c of 6.0% and all lipid parameters are in target range (LDL 70).  She has borderline kidney function with creatinine most recently 1.19.  Past Medical History:  Diagnosis Date   Acute thoracic aortic dissection 2008   emergency surgery - [facility]   AICD (automatic cardioverter/defibrillator) present    Anxiety    Cancer    skin   CKD (chronic kidney disease) stage 3, GFR 30-59 ml/min    Diabetes mellitus    Diverticulosis    GERD (gastroesophageal reflux disease)    Hemorrhoids    History of kidney stones    Hyperlipidemia    Hypertension    Kidney stones    S/P TAVR (transcatheter aortic valve replacement) 01/29/2020   s/p TAVR with a 23 mm Edwards Sapien 3 Ultra via the TA approach wtih Dr. Excell Seltzer and Dr. Laneta Simmers   Severe aortic stenosis    Sinus node dysfunction    hx of PPM   Sustained ventricular tachycardia    s/p ICD 05/2019    Past Surgical History:  Procedure Laterality Date   ABDOMINAL HYSTERECTOMY  1984   APPENDECTOMY     EYE SURGERY  2014   Rght eye   ICD IMPLANT N/A 06/28/2019   Procedure: PPM upgrade to ICD;  Surgeon: Marinus Maw, MD;  Location[facility] INVASIVE CV LAB;  Service: Cardiovascular;  Laterality: N/A;   PACEMAKER GENERATOR CHANGE  2014   Medtronic adapta   PACEMAKER  GENERATOR CHANGE N/A 08/29/2013   Procedure: PACEMAKER GENERATOR CHANGE;  Surgeon: Thurmon Fair, MD;  Location: [facility] CATH LAB;  Service: Cardiovascular;  Laterality: N/A;   PACEMAKER INSERTION  08/2006   dual chamber Medtronic EnRhythm; r/t sinus node dysfunction    REPAIR OF ACUTE ASCENDING THORACIC AORTIC DISSECTION  2008   Dr. Tyrone Sage   RIGHT/LEFT HEART CATH AND CORONARY ANGIOGRAPHY N/A 01/09/2020   Procedure: RIGHT/LEFT HEART CATH AND CORONARY ANGIOGRAPHY;  Surgeon: Tonny Bollman, MD;  Location: [facility] INVASIVE CV LAB;  Service: Cardiovascular;  Laterality: N/A;   TEE WITHOUT CARDIOVERSION N/A 01/29/2020   Procedure: TRANSESOPHAGEAL ECHOCARDIOGRAM (TEE);  Surgeon: Tonny Bollman, MD;  Location: [facility] OR;  Service: Open Heart Surgery;  Laterality: N/A;   THORACIC AORTIC ANEURYSM REPAIR  2000   type III   TRANSCATHETER AORTIC VALVE REPLACEMENT, TRANSAPICAL N/A 01/29/2020   Procedure: TRANSCATHETER AORTIC VALVE REPLACEMENT, TRANSAPICAL using a Edwards Sapien 3, 23mm  Transcatheter Heart Valve;  Surgeon: Tonny Bollman, MD;  Location: [facility] OR;  Service: Open Heart Surgery;  Laterality: N/A;   TRANSTHORACIC ECHOCARDIOGRAM  09/20/2012   EF=>55% with mild conc LVH; LA mod dilated; RA mildly dilated; mild MR/TR/AR    Current Medications: Outpatient Medications Prior to Visit  Medication Sig Dispense Refill   ALPRAZolam (XANAX) 0.5 MG tablet TAKE 1 TABLET BY MOUTH 3 TIMES DAILY AS NEEDED. 90 tablet 0   ASPIRIN LOW DOSE 81 MG chewable tablet CHEW 1 TABLET BY MOUTH EVERY DAY 90 tablet 3   Blood Glucose Calibration (TRUE METRIX LEVEL 1) Low SOLN 1 each by In Vitro route in the morning and at bedtime. 100 each 3   Blood Glucose Monitoring Suppl (TRUE METRIX METER) DEVI 1 each by Does not apply route in the morning and at bedtime. 1 each 0   citalopram (CELEXA) 20 MG tablet TAKE 1 TABLET EVERY DAY ALONG WITH 40MG  TO EQUAL 60MG  TOTAL 90 tablet 1   citalopram (CELEXA) 40 MG tablet TAKE 1 TABLET EVERY DAY ALONG  WITH 20MG  TO EQUAL 60MG  TOTAL 90 tablet 1   fluticasone (FLONASE) 50 MCG/ACT nasal spray USE 2 SPRAYS IN EACH NOSTIRL EVERY DAY 16 g 1   furosemide (LASIX) 20 MG tablet Take 1 tablet (20 mg total) by mouth daily. 90 tablet 3   glucose blood (TRUE METRIX BLOOD GLUCOSE TEST) test strip USE AS INSTRUCTED TWICE A DAY IN THE MORNING AND AT BEDTIME 200 strip 1   JARDIANCE 10 MG TABS tablet TAKE 1 TABLET BY MOUTH EVERY DAY 30 tablet 5   losartan (COZAAR) 25 MG tablet TAKE 1/2 TABLET BY MOUTH EVERY DAY AT BEDTIME 45 tablet 7   Potassium Chloride ER 20 MEQ TBCR TAKE 1 TABLET BY MOUTH EVERY DAY 90 tablet 4   Probiotic Product (PRO-BIOTIC BLEND PO) Take 1 tablet by mouth daily.      rosuvastatin (CRESTOR) 20 MG tablet TAKE 1 TABLET BY MOUTH EVERY DAY 90 tablet 0   Semaglutide,0.25 or 0.5MG /DOS, (OZEMPIC, 0.25 OR 0.5 MG/DOSE,) 2 MG/3ML SOPN INJECT 0.5 MG INTO THE SKIN ONCE A WEEK. 3 mL 2   TRUEplus Lancets 33G MISC TEST BLOOD SUGAR TWICE DAILY IN THE MORNING AND AT BEDTIME 200 each 12   carvedilol (COREG) 25 MG tablet TAKE 1 TABLET BY MOUTH TWICE A DAY WITH FOOD 180 tablet 1   amoxicillin (AMOXIL) 500 MG tablet TAKE 4 TABLETS BY MOUTH 1 HOUR PRIOR TO DENTAL WORK, INCLUDING CLEANINGS (Patient not taking: Reported on 03/17/2023) 12 tablet 12   doxycycline (VIBRA-TABS) 100 MG tablet Take 1 tablet (100 mg total) by mouth 2 (two) times daily. 20 tablet 0   furosemide (LASIX) 40 MG tablet TAKE 1/2 TABLET BY MOUTH DAILY (Patient not taking: Reported on 03/17/2023) 45 tablet 3   hyoscyamine (LEVSIN SL) 0.125 MG SL tablet TAKE 1 TABLET BY MOUTH TWICE A DAY AS NEEDED FOR CRAMPING OR DIARRHEA OR LOOSE STOOLS (Patient not taking: Reported on 03/17/2023) 180 tablet 1   Omega-3 Fatty Acids (FISH OIL) 1000 MG CAPS Take 1,000 mg by mouth daily. (Patient not taking: Reported on 03/17/2023)     RSV vaccine recomb adjuvanted (AREXVY) 120 MCG/0.5ML injection Inject into the muscle. (Patient not taking: Reported on 03/17/2023) 1 each 0    Facility-Administered Medications Prior to Visit  Medication Dose Route Frequency Provider Last Rate Last Admin   diclofenac sodium (VOLTAREN) 1 % transdermal gel 2 g  2 g Topical QID Zola Button, Yvonne R, DO         Allergies:   Oxycodone, Vicodin [hydrocodone-acetaminophen], Latex, Neomycin-bacitracin zn-polymyx, and Sulfonamide derivatives   Social History   Socioeconomic History   Marital status: Widowed    Spouse name: Not on file   Number of children: 2   Years  of education: Not on file   Highest education level: Not on file  Occupational History    Employer: RETIRED  Tobacco Use   Smoking status: Former    Types: Cigarettes    Quit date: 09/04/1993    Years since quitting: 29.5   Smokeless tobacco: Never  Vaping Use   Vaping Use: Never used  Substance and Sexual Activity   Alcohol use: No   Drug use: No   Sexual activity: Not Currently  Other Topics Concern   Not on file  Social History Narrative   Lives at home and husband was just moved to [place]   Social Determinants of Health   Financial Resource Strain: Low Risk  (11/03/2022)   Overall Financial Resource Strain (CARDIA)    Difficulty of Paying Living Expenses: Not hard at all  Food Insecurity: No Food Insecurity (01/10/2023)   Hunger Vital Sign    Worried About Running Out of Food in the Last Year: Never true    Ran Out of Food in the Last Year: Never true  Transportation Needs: No Transportation Needs (01/10/2023)   PRAPARE - Administrator, Civil Service (Medical): No    Lack of Transportation (Non-Medical): No  Physical Activity: Inactive (11/03/2022)   Exercise Vital Sign    Days of Exercise per Week: 0 days    Minutes of Exercise per Session: 0 min  Stress: No Stress Concern Present (11/03/2022)   [employer] of Occupational Health - Occupational Stress Questionnaire    Feeling of Stress : Not at all  Social Connections: Moderately Isolated (11/03/2022)   Social Connection  and Isolation Panel [NHANES]    Frequency of Communication with Friends and Family: More than three times a week    Frequency of Social Gatherings with Friends and Family: More than three times a week    Attends Religious Services: 1 to 4 times per year    Active Member of [employer] or Organizations: No    Attends Banker Meetings: Never    Marital Status: Widowed     Family History:  The patient's family history includes Heart attack in her father and maternal grandmother; Kidney disease in her mother; Valvular heart disease in her son.   ROS:   Please see the history of present illness.    ROS All other systems are reviewed and are negative.   PHYSICAL EXAM:   VS:  BP 120/76 (BP Location: Left Arm, Patient Position: Sitting, Cuff Size: Large)   Pulse 71   Ht 5' (1.524 m)   Wt 178 lb 12.8 oz (81.1 kg)   SpO2 94%   BMI 34.92 kg/m      General: Alert, oriented x3, no distress, mildly obese.  Healthy left subclavian pacemaker site. Head: no evidence of trauma, PERRL, EOMI, no exophtalmos or lid lag, no myxedema, no xanthelasma; normal ears, nose and oropharynx Neck: normal jugular venous pulsations and no hepatojugular reflux; brisk carotid pulses without delay and no carotid bruits Chest: clear to auscultation, no signs of consolidation by percussion or palpation, normal fremitus, symmetrical and full respiratory excursions Cardiovascular: normal position and quality of the apical impulse, regular rhythm, normal first and second heart sounds, faint 1/6 aortic ejection murmur, no diastolic murmurs, rubs or gallops Abdomen: no tenderness or distention, no masses by palpation, no abnormal pulsatility or arterial bruits, normal bowel sounds, no hepatosplenomegaly Extremities: no clubbing, cyanosis or edema; 2+ radial, ulnar and brachial pulses bilaterally; 2+ right femoral, posterior tibial and  dorsalis pedis pulses; 2+ left femoral, posterior tibial and dorsalis pedis pulses; no  subclavian or femoral bruits Neurological: grossly nonfocal Psych: Normal mood and affect      Wt Readings from Last 3 Encounters:  03/17/23 178 lb 12.8 oz (81.1 kg)  01/27/23 179 lb 12.8 oz (81.6 kg)  11/03/22 172 lb (78 kg)      Studies/Labs Reviewed:   ECHO 04/10/2019 2020:   1. Left ventricular ejection fraction, by estimation, is 40 to 45%. The  left ventricle has mildly decreased function. The left ventricle  demonstrates global hypokinesis. There is mild left ventricular  hypertrophy. Left ventricular diastolic parameters  are consistent with Grade I diastolic dysfunction (impaired relaxation).   2. Right ventricular systolic function is mildly reduced. The right  ventricular size is normal. Tricuspid regurgitation signal is inadequate  for assessing PA pressure.   3. The mitral valve is normal in structure. Trivial mitral valve  regurgitation. No evidence of mitral stenosis.   4. There is a 23 mm Edwards Sapien prosthetic (TAVR) valve present in the  aortic position. Procedure Date: 01/29/2020. Aortic valve regurgitation is  not visualized.      Echo findings are consistent with normal structure and function of the  aortic valve prosthesis. Aortic valve mean gradient measures 7.0 mmHg,  unchanged from prior echo 01/28/21   5. The inferior vena cava is normal in size with greater than 50%  respiratory variability, suggesting right atrial pressure of 3 mmHg   EKG:  EKG is ordered today.  This shows AV sequential pacing, QRS 188 ms, QTc 515 ms.  Recent Labs: 08/31/2022: ALT 7; Hemoglobin 12.6; Platelets 131.0 03/04/2023: BUN 23; Creatinine, Ser 1.19; Potassium 4.8; Sodium 140   Lipid Panel    Component Value Date/Time   CHOL 152 08/31/2022 0921   TRIG 150.0 (H) 08/31/2022 0921   HDL 52.40 08/31/2022 0921   CHOLHDL 3 08/31/2022 0921   VLDL 30.0 08/31/2022 0921   LDLCALC 70 08/31/2022 0921   LDLCALC 65 09/02/2020 1134   LDLDIRECT 217.7 09/13/2011 1535      ASSESSMENT:    1. Chronic combined systolic and diastolic heart failure   2. S/P TAVR (transcatheter aortic valve replacement)   3. SSS (sick sinus syndrome)   4. Second degree AV block   5. ICD (implantable cardioverter-defibrillator) in place   6. Ventricular tachycardia   7. Paroxysmal atrial flutter   8. Dissection of ascending aorta   9. Essential hypertension   10. Hypercholesterolemia   11. Type 2 diabetes mellitus with complication   12. Stage 3b chronic kidney disease      PLAN:  In order of problems listed above:  CHF: Well compensated, NYHA functional class I, euvolemic clinically and based on her OptiVol. Only requires an intermittent low-dose of diuretic.  She is on beta-blockers, ARB (blood pressure will not tolerate Entresto) and SGLT2 inhibitor.  Carvedilol dose decreased due to intolerable fatigue. AS s/p transapical TAVR: Plan to repeat echo in May.  Aware of need for endocarditis prophylaxis. SSS/AV block: Pacemaker dependent: Absent atrial activity.  Attempts to diminish ventricular pacing by using MVP exposed her to the risk of torsades de pointes due to ventricular pauses.  Appropriate heart rate histogram distribution with current sensor settings. ICD: Normal device function, followed by Dr. Ladona Ridgel with remote downloads every 3 months.  Option to upgrade to CRT-D if LVEF declines. VT: None seen on current device download.  No recurrence in almost 3 years since her episode of  sustained monomorphic VT AFlutter: Remote arrhythmia, none has been detected since this pacemaker was implanted 6 years ago.   Hx Ao dissection: Stable findings on yearly CT angiogram. HTN: Excellent control even after reducing the dose of carvedilol. HLP: LDL is 70, well within target range..  Minimal nonobstructive RCA stenosis at the time of cardiac catheterization 2021, essentially unchanged when compared with study from 2008.  Mild aortic atherosclerosis seen on CT scans.  Continue  statin. DM: Very well controlled with recent A1c of 6.0%.  On Ozempic and Jardiance.   CKD 3b: Improved creatinine compared to last year. Obesity: She's lost a little bit of weight in the last 12 months. BMI right at 35.     Medication Adjustments/Labs and Tests Ordered: Current medicines are reviewed at length with the patient today.  Concerns regarding medicines are outlined above.  Medication changes, Labs and Tests ordered today are listed in the Patient Instructions below. Patient Instructions  Medication Instructions:  Decrease Carvedilol to 12.5 mg twice a day *If you need a refill on your cardiac medications before your next appointment, please call your pharmacy*  Testing/Procedures: Your physician has requested that you have an echocardiogram. Echocardiography is a painless test that uses sound waves to create images of your heart. It provides your doctor with information about the size and shape of your heart and how well your heart's chambers and valves are working. This procedure takes approximately one hour. There are no restrictions for this procedure. Please do NOT wear cologne, perfume, aftershave, or lotions (deodorant is allowed). Please arrive 15 minutes prior to your appointment time.    Follow-Up: At [facility], you and your health needs are our priority.  As part of our continuing mission to provide you with exceptional heart care, we have created designated Provider Care Teams.  These Care Teams include your primary Cardiologist (physician) and Advanced Practice Providers (APPs -  Physician Assistants and Nurse Practitioners) who all work together to provide you with the care you need, when you need it.  We recommend signing up for the patient portal called "MyChart".  Sign up information is provided on this After Visit Summary.  MyChart is used to connect with patients for Virtual Visits (Telemedicine).  Patients are able to view lab/test results, encounter  notes, upcoming appointments, etc.  Non-urgent messages can be sent to your provider as well.   To learn more about what you can do with MyChart, go to [email or web address].    Your next appointment:   1 year(s)-Phys Pacer Check  Provider:   Thurmon Fair, MD       Signed, Thurmon Fair, MD  03/20/2023 5:58 PM    [facility] [address] [place], [place], [place]  [address] Phone: [phone]; Fax: [phone]

## 2023-03-20 ENCOUNTER — Encounter: Payer: Self-pay | Admitting: Cardiovascular Disease

## 2023-03-23 ENCOUNTER — Other Ambulatory Visit: Payer: Self-pay | Admitting: Family Medicine

## 2023-03-23 DIAGNOSIS — E1165 Type 2 diabetes mellitus with hyperglycemia: Secondary | ICD-10-CM

## 2023-03-30 ENCOUNTER — Encounter: Payer: Self-pay | Admitting: Cardiovascular Disease

## 2023-03-30 MED ORDER — FUROSEMIDE 40 MG PO TABS: ORAL_TABLET | ORAL | 6 refills | Status: AC

## 2023-03-30 NOTE — Telephone Encounter (Signed)
I believe we settled on furosemide 40 mg prn only for edema/weight gain >3lb in 24h or >5 lb in a week

## 2023-03-31 ENCOUNTER — Encounter: Payer: Self-pay | Admitting: Family Medicine

## 2023-03-31 ENCOUNTER — Other Ambulatory Visit: Payer: Self-pay | Admitting: Family Medicine

## 2023-03-31 DIAGNOSIS — F419 Anxiety disorder, unspecified: Secondary | ICD-10-CM

## 2023-04-01 MED ORDER — ALPRAZOLAM 0.5 MG PO TABS: 0.5000 mg | ORAL_TABLET | Freq: Three times a day (TID) | ORAL | 0 refills | Status: DC | PRN

## 2023-04-01 NOTE — Telephone Encounter (Signed)
Requesting: Xanax Contract: 10/12/2022 UDS: 10/12/2022 Last OV: 01/27/2023 Next OV: N/A Last Refill: 03/03/23, #90--0 RF Database:   Please advise

## 2023-04-05 NOTE — Progress Notes (Signed)
Remote ICD transmission.   

## 2023-04-18 ENCOUNTER — Ambulatory Visit (HOSPITAL_COMMUNITY): Payer: Medicare HMO | Attending: Cardiovascular Disease

## 2023-04-18 DIAGNOSIS — Z952 Presence of prosthetic heart valve: Secondary | ICD-10-CM | POA: Insufficient documentation

## 2023-04-18 LAB — ECHOCARDIOGRAM COMPLETE
AR max vel: 1.17 cm2
AV Area VTI: 1.07 cm2
AV Area mean vel: 1.19 cm2
AV Mean grad: 12 mmHg
AV Peak grad: 21.5 mmHg
Ao pk vel: 2.32 m/s
Area-P 1/2: 3.7 cm2
S' Lateral: 2.7 cm

## 2023-04-25 ENCOUNTER — Other Ambulatory Visit: Payer: Self-pay | Admitting: Cardiovascular Disease

## 2023-05-04 ENCOUNTER — Other Ambulatory Visit: Payer: Self-pay | Admitting: Family Medicine

## 2023-05-04 DIAGNOSIS — F419 Anxiety disorder, unspecified: Secondary | ICD-10-CM

## 2023-05-05 ENCOUNTER — Other Ambulatory Visit: Payer: Self-pay | Admitting: Family Medicine

## 2023-05-05 DIAGNOSIS — E1169 Type 2 diabetes mellitus with other specified complication: Secondary | ICD-10-CM

## 2023-05-05 DIAGNOSIS — F419 Anxiety disorder, unspecified: Secondary | ICD-10-CM

## 2023-05-05 MED ORDER — ALPRAZOLAM 0.5 MG PO TABS: 0.5000 mg | ORAL_TABLET | Freq: Three times a day (TID) | ORAL | 0 refills | Status: DC | PRN

## 2023-05-05 NOTE — Telephone Encounter (Signed)
Requesting: alprazolam 0.5mg   Contract: 10/12/22 UDS: 10/12/22 Last Visit: 01/27/23 Next Visit: None Last Refill:  04/01/23 #90 and 0RF   Please Advise

## 2023-05-24 ENCOUNTER — Ambulatory Visit (INDEPENDENT_AMBULATORY_CARE_PROVIDER_SITE_OTHER): Payer: Medicare HMO

## 2023-05-24 DIAGNOSIS — I495 Sick sinus syndrome: Secondary | ICD-10-CM

## 2023-05-25 LAB — CUP PACEART REMOTE DEVICE CHECK
Battery Remaining Longevity: 42 mo
Battery Voltage: 2.95 V
Brady Statistic AP VP Percent: 99.51 %
Brady Statistic AP VS Percent: 0.02 %
Brady Statistic AS VP Percent: 0.47 %
Brady Statistic AS VS Percent: 0 %
Brady Statistic RA Percent Paced: 99.51 %
Brady Statistic RV Percent Paced: 99.94 %
Date Time Interrogation Session: 20240626121607
HighPow Impedance: 96 Ohm
Implantable Lead Connection Status: 753985
Implantable Lead Connection Status: 753985
Implantable Lead Connection Status: 753985
Implantable Lead Implant Date: 20071016
Implantable Lead Implant Date: 20071016
Implantable Lead Implant Date: 20200730
Implantable Lead Location: 753859
Implantable Lead Location: 753860
Implantable Lead Location: 753860
Implantable Lead Model: 4092
Implantable Lead Model: 5594
Implantable Lead Model: 6935
Implantable Pulse Generator Implant Date: 20200730
Lead Channel Impedance Value: 342 Ohm
Lead Channel Impedance Value: 513 Ohm
Lead Channel Impedance Value: 570 Ohm
Lead Channel Pacing Threshold Amplitude: 0.5 V
Lead Channel Pacing Threshold Amplitude: 0.75 V
Lead Channel Pacing Threshold Pulse Width: 0.4 ms
Lead Channel Pacing Threshold Pulse Width: 0.4 ms
Lead Channel Sensing Intrinsic Amplitude: 26.375 mV
Lead Channel Sensing Intrinsic Amplitude: 26.375 mV
Lead Channel Sensing Intrinsic Amplitude: 3.25 mV
Lead Channel Sensing Intrinsic Amplitude: 3.25 mV
Lead Channel Setting Pacing Amplitude: 1.75 V
Lead Channel Setting Pacing Amplitude: 2.5 V
Lead Channel Setting Pacing Pulse Width: 0.4 ms
Lead Channel Setting Sensing Sensitivity: 0.3 mV
Zone Setting Status: 755011
Zone Setting Status: 755011

## 2023-06-01 ENCOUNTER — Other Ambulatory Visit: Payer: Self-pay | Admitting: Family Medicine

## 2023-06-01 DIAGNOSIS — F419 Anxiety disorder, unspecified: Secondary | ICD-10-CM

## 2023-06-01 NOTE — Telephone Encounter (Signed)
Pt is scheduled 7/8 to f/u and was wondering if she can get a partial refill of xanax until then.    Requesting: Xanax Contract: 10/12/22 UDS: 10/12/22 Last OV: 01/27/23 Next OV: 06/06/23 Last Refill: 05/05/23, #90--0 RF Database:   Please advise

## 2023-06-01 NOTE — Telephone Encounter (Signed)
Pt is scheduled 7/8 to f/u and was wondering if she can get a partial refill of xanax until then. Please advise.

## 2023-06-03 MED ORDER — ALPRAZOLAM 0.5 MG PO TABS: 0.5000 mg | ORAL_TABLET | Freq: Three times a day (TID) | ORAL | 0 refills | Status: DC | PRN

## 2023-06-06 ENCOUNTER — Encounter: Payer: Self-pay | Admitting: Family Medicine

## 2023-06-06 ENCOUNTER — Other Ambulatory Visit (HOSPITAL_BASED_OUTPATIENT_CLINIC_OR_DEPARTMENT_OTHER): Payer: Self-pay

## 2023-06-06 ENCOUNTER — Ambulatory Visit (INDEPENDENT_AMBULATORY_CARE_PROVIDER_SITE_OTHER): Payer: Medicare HMO | Admitting: Family Medicine

## 2023-06-06 VITALS — BP 118/80 | HR 78 | Temp 97.8°F | Resp 18 | Ht 60.0 in | Wt 185.0 lb

## 2023-06-06 DIAGNOSIS — E785 Hyperlipidemia, unspecified: Secondary | ICD-10-CM | POA: Diagnosis not present

## 2023-06-06 DIAGNOSIS — I5043 Acute on chronic combined systolic (congestive) and diastolic (congestive) heart failure: Secondary | ICD-10-CM

## 2023-06-06 DIAGNOSIS — F419 Anxiety disorder, unspecified: Secondary | ICD-10-CM

## 2023-06-06 DIAGNOSIS — N1832 Chronic kidney disease, stage 3b: Secondary | ICD-10-CM | POA: Diagnosis not present

## 2023-06-06 DIAGNOSIS — E1169 Type 2 diabetes mellitus with other specified complication: Secondary | ICD-10-CM | POA: Diagnosis not present

## 2023-06-06 DIAGNOSIS — E1165 Type 2 diabetes mellitus with hyperglycemia: Secondary | ICD-10-CM | POA: Diagnosis not present

## 2023-06-06 DIAGNOSIS — I4892 Unspecified atrial flutter: Secondary | ICD-10-CM

## 2023-06-06 DIAGNOSIS — Z7985 Long-term (current) use of injectable non-insulin antidiabetic drugs: Secondary | ICD-10-CM

## 2023-06-06 LAB — COMPREHENSIVE METABOLIC PANEL
ALT: 9 U/L (ref 0–35)
AST: 12 U/L (ref 0–37)
Albumin: 4.1 g/dL (ref 3.5–5.2)
Alkaline Phosphatase: 45 U/L (ref 39–117)
BUN: 27 mg/dL — ABNORMAL HIGH (ref 6–23)
CO2: 28 mEq/L (ref 19–32)
Calcium: 9.8 mg/dL (ref 8.4–10.5)
Chloride: 103 mEq/L (ref 96–112)
Creatinine, Ser: 1.19 mg/dL (ref 0.40–1.20)
GFR: 44.36 mL/min — ABNORMAL LOW (ref >60.00)
Glucose, Bld: 90 mg/dL (ref 70–99)
Potassium: 4.4 mEq/L (ref 3.5–5.1)
Sodium: 139 mEq/L (ref 135–145)
Total Bilirubin: 0.5 mg/dL (ref 0.2–1.2)
Total Protein: 6.5 g/dL (ref 6.0–8.3)

## 2023-06-06 LAB — LIPID PANEL
Cholesterol: 173 mg/dL (ref 0–200)
HDL: 58.5 mg/dL (ref >39.00)
LDL Cholesterol: 96 mg/dL (ref 0–99)
NonHDL: 114.9
Total CHOL/HDL Ratio: 3
Triglycerides: 95 mg/dL (ref 0.0–149.0)
VLDL: 19 mg/dL (ref 0.0–40.0)

## 2023-06-06 LAB — MICROALBUMIN / CREATININE URINE RATIO
Creatinine,U: 101.6 mg/dL
Microalb Creat Ratio: 6.7 mg/g (ref 0.0–30.0)
Microalb, Ur: 6.8 mg/dL — ABNORMAL HIGH (ref 0.0–1.9)

## 2023-06-06 LAB — HEMOGLOBIN A1C: Hgb A1c MFr Bld: 5.9 % (ref 4.6–6.5)

## 2023-06-06 MED ORDER — OZEMPIC (0.25 OR 0.5 MG/DOSE) 2 MG/3ML ~~LOC~~ SOPN: PEN_INJECTOR | SUBCUTANEOUS | 2 refills | Status: DC

## 2023-06-06 MED ORDER — CITALOPRAM HYDROBROMIDE 40 MG PO TABS
ORAL_TABLET | ORAL | 1 refills | Status: DC
Start: 2023-06-06 — End: 2023-09-16

## 2023-06-06 MED ORDER — SHINGRIX 50 MCG/0.5ML IM SUSR
0.5000 mL | Freq: Once | INTRAMUSCULAR | 0 refills | Status: AC
  Filled 2023-06-06: qty 0.5, 1d supply, fill #0

## 2023-06-06 MED ORDER — CITALOPRAM HYDROBROMIDE 20 MG PO TABS
ORAL_TABLET | ORAL | 1 refills | Status: DC
Start: 2023-06-06 — End: 2023-10-24

## 2023-06-06 NOTE — Progress Notes (Signed)
Established Patient Office Visit  Subjective   Patient ID: [name], female    DOB: [birth year]  Age: 77 y.o. MRN: [id number]  Chief Complaint  Patient presents with   Diabetes   Hyperlipidemia   Follow-up    HPI Discussed the use of AI scribe software for clinical note transcription with the patient, who gave verbal consent to proceed.  History of Present Illness   The patient, with a history of diabetes, presents for routine follow-up. She reports feeling 'the best I have felt in years.' She has been working hard to manage their blood glucose levels, with recent readings ranging from 98 to 160, mostly later in the day. The fasting glucose levels are around 98. She attributes their improved control and well-being to Ozempic, which she describes as a 'blessing,' despite its high cost due to the 'donut hole' in their insurance coverage.  The patient also has a history of hypertension, for which their medication was recently halved by another provider. She reports feeling 'much more balanced' since the adjustment, with blood pressure readings around 118, up from a previous average of 100. She denies any symptoms of dizziness or lightheadedness.  She also mentions a history of shingles, for which she received the first dose of the vaccine and is due for the second dose. She plans to receive the second dose at the pharmacy after the appointment.  The patient also reports an upcoming appointment with an eye doctor, scheduled for the first week of August.      Patient Active Problem List   Diagnosis Date Noted   Abscess 01/27/2023   Dermatomyositis (HCC) 02/02/2022   Adrenal nodule (HCC) 02/02/2022   Preventative health care 04/30/2021   Hyperlipidemia associated with type 2 diabetes mellitus (HCC) 09/02/2020   Uncontrolled type 2 diabetes mellitus with hyperglycemia (HCC) 09/02/2020   Acute on chronic combined systolic and diastolic CHF (congestive heart failure) (HCC) 01/29/2020    S/P TAVR (transcatheter aortic valve replacement) 01/29/2020   Type 2 diabetes mellitus with complication (HCC)    Severe aortic stenosis    CKD (chronic kidney disease) stage 3, GFR 30-59 ml/min (HCC)    ICD (implantable cardioverter-defibrillator) in place 08/15/2019   Ventricular tachycardia (HCC)    Anemia 06/27/2019   Severe obesity (BMI 35.0-35.9 with comorbidity) (HCC) 12/08/2018   Diabetes mellitus, type II (HCC) 04/24/2015   Paroxysmal atrial flutter (HCC) 10/07/2013   GERD 01/15/2011   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, status post graft repair the ascending aorta 10/24/2007   Hyperlipidemia LDL goal <100 05/23/2007   Anxiety 05/23/2007   Primary hypertension 05/23/2007   Past Medical History:  Diagnosis Date   Acute thoracic aortic dissection (HCC) 2008   emergency surgery - Gerhardt   AICD (automatic cardioverter/defibrillator) present    Anxiety    Cancer (HCC)    skin   CKD (chronic kidney disease) stage 3, GFR 30-59 ml/min (HCC)    Diabetes mellitus    Diverticulosis    GERD (gastroesophageal reflux disease)    Hemorrhoids    History of kidney stones    Hyperlipidemia    Hypertension    Kidney stones    S/P TAVR (transcatheter aortic valve replacement) 01/29/2020   s/p TAVR with a 23 mm Edwards Sapien 3 Ultra via the TA approach wtih Dr. Excell Seltzer and Dr. Laneta Simmers   Severe aortic stenosis    Sinus node dysfunction (HCC)    hx of  PPM   Sustained ventricular tachycardia (HCC)    s/p ICD 05/2019   Past Surgical History:  Procedure Laterality Date   ABDOMINAL HYSTERECTOMY  1984   APPENDECTOMY     EYE SURGERY  2014   Rght eye   ICD IMPLANT N/A 06/28/2019   Procedure: PPM upgrade to ICD;  Surgeon: Marinus Maw, MD;  Location: [facility] INVASIVE CV LAB;  Service: Cardiovascular;  Laterality: N/A;   PACEMAKER GENERATOR CHANGE  2014   Medtronic adapta   PACEMAKER GENERATOR CHANGE N/A 08/29/2013   Procedure: PACEMAKER  GENERATOR CHANGE;  Surgeon: Thurmon Fair, MD;  Location: [facility] CATH LAB;  Service: Cardiovascular;  Laterality: N/A;   PACEMAKER INSERTION  08/2006   dual chamber Medtronic EnRhythm; r/t sinus node dysfunction    REPAIR OF ACUTE ASCENDING THORACIC AORTIC DISSECTION  2008   Dr. Tyrone Sage   RIGHT/LEFT HEART CATH AND CORONARY ANGIOGRAPHY N/A 01/09/2020   Procedure: RIGHT/LEFT HEART CATH AND CORONARY ANGIOGRAPHY;  Surgeon: Tonny Bollman, MD;  Location: [facility] INVASIVE CV LAB;  Service: Cardiovascular;  Laterality: N/A;   TEE WITHOUT CARDIOVERSION N/A 01/29/2020   Procedure: TRANSESOPHAGEAL ECHOCARDIOGRAM (TEE);  Surgeon: Tonny Bollman, MD;  Location: [facility] OR;  Service: Open Heart Surgery;  Laterality: N/A;   THORACIC AORTIC ANEURYSM REPAIR  2000   type III   TRANSCATHETER AORTIC VALVE REPLACEMENT, TRANSAPICAL N/A 01/29/2020   Procedure: TRANSCATHETER AORTIC VALVE REPLACEMENT, TRANSAPICAL using a Edwards Sapien 3, 23mm  Transcatheter Heart Valve;  Surgeon: Tonny Bollman, MD;  Location: [facility] OR;  Service: Open Heart Surgery;  Laterality: N/A;   TRANSTHORACIC ECHOCARDIOGRAM  09/20/2012   EF=>55% with mild conc LVH; LA mod dilated; RA mildly dilated; mild MR/TR/AR   Social History   Tobacco Use   Smoking status: Former    Types: Cigarettes    Quit date: 09/04/1993    Years since quitting: 29.7   Smokeless tobacco: Never  Vaping Use   Vaping Use: Never used  Substance Use Topics   Alcohol use: No   Drug use: No   Social History   Socioeconomic History   Marital status: Widowed    Spouse name: Not on file   Number of children: 2   Years of education: Not on file   Highest education level: 12th grade  Occupational History    Employer: RETIRED  Tobacco Use   Smoking status: Former    Types: Cigarettes    Quit date: 09/04/1993    Years since quitting: 29.7   Smokeless tobacco: Never  Vaping Use   Vaping Use: Never used  Substance and Sexual Activity   Alcohol use: No   Drug use: No    Sexual activity: Not Currently  Other Topics Concern   Not on file  Social History Narrative   Lives at home and husband was just moved to [place]   Social Determinants of Health   Financial Resource Strain: Low Risk  (06/05/2023)   Overall Financial Resource Strain (CARDIA)    Difficulty of Paying Living Expenses: Not hard at all  Food Insecurity: No Food Insecurity (06/05/2023)   Hunger Vital Sign    Worried About Running Out of Food in the Last Year: Never true    Ran Out of Food in the Last Year: Never true  Transportation Needs: No Transportation Needs (06/05/2023)   PRAPARE - Administrator, Civil Service (Medical): No    Lack of Transportation (Non-Medical): No  Physical Activity: Insufficiently Active (06/05/2023)   Exercise Vital Sign  Days of Exercise per Week: 3 days    Minutes of Exercise per Session: 30 min  Stress: Stress Concern Present (06/05/2023)   [employer] of Occupational Health - Occupational Stress Questionnaire    Feeling of Stress : To some extent  Social Connections: Unknown (06/05/2023)   Social Connection and Isolation Panel [NHANES]    Frequency of Communication with Friends and Family: More than three times a week    Frequency of Social Gatherings with Friends and Family: Twice a week    Attends Religious Services: Patient declined    Database administrator or Organizations: Patient declined    Attends Banker Meetings: Never    Marital Status: Widowed  Intimate Partner Violence: Not At Risk (11/03/2022)   Humiliation, Afraid, Rape, and Kick questionnaire    Fear of Current or Ex-Partner: No    Emotionally Abused: No    Physically Abused: No    Sexually Abused: No   Family Status  Relation Name Status   Mother  Deceased at age 69       kidney failure   Father  Deceased at age 63       MI   MGM  Deceased at age 80       MI   Son  (Not Specified)   MGF  Deceased   PGM  Deceased   PGF  Deceased   Neg Hx  (Not  Specified)   Family History  Problem Relation Age of Onset   Kidney disease Mother    Heart attack Father    Heart attack Maternal Grandmother    Valvular heart disease Son        valve replacement at 35   Cancer Neg Hx    Allergies  Allergen Reactions   Oxycodone Itching   Vicodin [Hydrocodone-Acetaminophen] Itching   Latex Itching and Rash   Neomycin-Bacitracin Zn-Polymyx Other (See Comments)    Red , swollen eye   Sulfonamide Derivatives Other (See Comments)    Just had reaction when taking during pregnancy- last child birth 11      Review of Systems  Constitutional:  Negative for fever and malaise/fatigue.  HENT:  Negative for congestion.   Eyes:  Negative for blurred vision.  Respiratory:  Negative for cough and shortness of breath.   Cardiovascular:  Negative for chest pain, palpitations and leg swelling.  Gastrointestinal:  Negative for vomiting.  Musculoskeletal:  Negative for back pain.  Skin:  Negative for rash.  Neurological:  Negative for loss of consciousness and headaches.      Objective:     BP 118/80 (BP Location: Right Arm, Patient Position: Sitting, Cuff Size: Large)   Pulse 78   Temp 97.8 F (36.6 C) (Oral)   Resp 18   Ht 5' (1.524 m)   Wt 185 lb (83.9 kg)   SpO2 98%   BMI 36.13 kg/m  BP Readings from Last 3 Encounters:  06/06/23 118/80  03/17/23 120/76  01/27/23 130/80   Wt Readings from Last 3 Encounters:  06/06/23 185 lb (83.9 kg)  03/17/23 178 lb 12.8 oz (81.1 kg)  01/27/23 179 lb 12.8 oz (81.6 kg)   SpO2 Readings from Last 3 Encounters:  06/06/23 98%  03/17/23 94%  01/27/23 96%      Physical Exam Vitals and nursing note reviewed.  Constitutional:      General: She is not in acute distress.    Appearance: Normal appearance. She is well-developed.  HENT:  Head: Normocephalic and atraumatic.  Eyes:     General: No scleral icterus.       Right eye: No discharge.        Left eye: No discharge.  Cardiovascular:      Rate and Rhythm: Normal rate and regular rhythm.     Heart sounds: Murmur heard.  Pulmonary:     Effort: Pulmonary effort is normal. No respiratory distress.     Breath sounds: Normal breath sounds.  Musculoskeletal:        General: Normal range of motion.     Cervical back: Normal range of motion and neck supple.     Right lower leg: No edema.     Left lower leg: No edema.  Skin:    General: Skin is warm and dry.  Neurological:     Mental Status: She is alert and oriented to person, place, and time.  Psychiatric:        Mood and Affect: Mood normal.        Behavior: Behavior normal.        Thought Content: Thought content normal.        Judgment: Judgment normal.     No results found for any visits on 06/06/23.  Last CBC Lab Results  Component Value Date   WBC 5.0 08/31/2022   HGB 12.6 08/31/2022   HCT 37.2 08/31/2022   MCV 101.9 (H) 08/31/2022   MCH 30.2 02/01/2020   RDW 14.5 08/31/2022   PLT 131.0 (L) 08/31/2022   Last metabolic panel Lab Results  Component Value Date   GLUCOSE 90 03/04/2023   NA 140 03/04/2023   K 4.8 03/04/2023   CL 104 03/04/2023   CO2 23 03/04/2023   BUN 23 03/04/2023   CREATININE 1.19 (H) 03/04/2023   EGFR 47 (L) 03/04/2023   CALCIUM 9.5 03/04/2023   PHOS 3.0 06/28/2019   PROT 6.9 08/31/2022   ALBUMIN 4.4 08/31/2022   BILITOT 0.5 08/31/2022   ALKPHOS 47 08/31/2022   AST 12 08/31/2022   ALT 7 08/31/2022   ANIONGAP 8 02/01/2020   Last lipids Lab Results  Component Value Date   CHOL 152 08/31/2022   HDL 52.40 08/31/2022   LDLCALC 70 08/31/2022   LDLDIRECT 217.7 09/13/2011   TRIG 150.0 (H) 08/31/2022   CHOLHDL 3 08/31/2022   Last hemoglobin A1c Lab Results  Component Value Date   HGBA1C 6.0 08/31/2022   Last thyroid functions Lab Results  Component Value Date   TSH 0.893 06/28/2019   Last vitamin D Lab Results  Component Value Date   VD25OH 13 (L) 12/09/2010   Last vitamin B12 and Folate Lab Results  Component  Value Date   VITAMINB12 248 06/28/2019   FOLATE 12.5 06/28/2019      The 10-year ASCVD risk score (Arnett DK, et al., 2019) is: 34.8%    Assessment & Plan:   Problem List Items Addressed This Visit       Unprioritized   Anxiety   Relevant Medications   citalopram (CELEXA) 20 MG tablet   citalopram (CELEXA) 40 MG tablet   Uncontrolled type 2 diabetes mellitus with hyperglycemia (HCC) - Primary    hgba1c to be checked , minimize simple carbs. Increase exercise as tolerated. Continue current meds       Relevant Medications   Semaglutide,0.25 or 0.5MG /DOS, (OZEMPIC, 0.25 OR 0.5 MG/DOSE,) 2 MG/3ML SOPN   Other Relevant Orders   Comprehensive metabolic panel   Lipid panel   Hemoglobin A1c  Microalbumin / creatinine urine ratio   Paroxysmal atrial flutter (HCC)    See cardiology      Hyperlipidemia associated with type 2 diabetes mellitus (HCC)    Tolerating statin, encouraged heart healthy diet, avoid trans fats, minimize simple carbs and saturated fats. Increase exercise as tolerated       Relevant Medications   Semaglutide,0.25 or 0.5MG /DOS, (OZEMPIC, 0.25 OR 0.5 MG/DOSE,) 2 MG/3ML SOPN   Other Relevant Orders   Comprehensive metabolic panel   Lipid panel   CKD (chronic kidney disease) stage 3, GFR 30-59 ml/min (HCC)    Check labs       Acute on chronic combined systolic and diastolic CHF (congestive heart failure) (HCC)    Per cardiology     Assessment and Plan    Diabetes Mellitus: Improved blood glucose control with fasting blood glucose ranging from 98 to 160. Patient reports feeling well on current regimen of Ozempic 0.5mg . -Continue Ozempic 0.5mg . -Check urine for protein.  Hypertension: Blood pressure managed with reduced medication dose. Patient reports improved balance and energy. -Continue current blood pressure medication regimen.  Shingles Vaccination: Patient received first dose and is due for second dose. -Administer second dose of Shingles  vaccine at pharmacy today.  General Health Maintenance: -Eye exam scheduled for first week of August. -Follow-up after lab results.        No follow-ups on file.    Donato Schultz, DO

## 2023-06-06 NOTE — Assessment & Plan Note (Signed)
See cardiology

## 2023-06-06 NOTE — Assessment & Plan Note (Signed)
hgba1c to be checked, minimize simple carbs. Increase exercise as tolerated. Continue current meds  

## 2023-06-06 NOTE — Assessment & Plan Note (Signed)
Tolerating statin, encouraged heart healthy diet, avoid trans fats, minimize simple carbs and saturated fats. Increase exercise as tolerated 

## 2023-06-06 NOTE — Assessment & Plan Note (Signed)
Per cardiology 

## 2023-06-06 NOTE — Assessment & Plan Note (Signed)
Check labs 

## 2023-06-16 ENCOUNTER — Encounter: Payer: Self-pay | Admitting: Cardiovascular Disease

## 2023-06-16 NOTE — Progress Notes (Signed)
Remote ICD transmission.   

## 2023-06-16 NOTE — Telephone Encounter (Signed)
OK - have filled out MD part

## 2023-07-07 ENCOUNTER — Encounter: Payer: Self-pay | Admitting: Family Medicine

## 2023-07-07 DIAGNOSIS — F419 Anxiety disorder, unspecified: Secondary | ICD-10-CM

## 2023-07-08 MED ORDER — ALPRAZOLAM 0.5 MG PO TABS: 0.5000 mg | ORAL_TABLET | Freq: Three times a day (TID) | ORAL | 0 refills | Status: DC | PRN

## 2023-07-08 NOTE — Telephone Encounter (Signed)
Requesting: Xanax  Contract: 10/12/22 UDS: 10/12/22 Last OV: 06/06/23 Next OV: N/A Last Refill: 06/03/23, #[id number] RF Database:   Please advise

## 2023-07-15 ENCOUNTER — Other Ambulatory Visit: Payer: Self-pay | Admitting: Cardiovascular Disease

## 2023-07-15 DIAGNOSIS — I10 Essential (primary) hypertension: Secondary | ICD-10-CM

## 2023-07-26 ENCOUNTER — Encounter: Payer: Self-pay | Admitting: Family Medicine

## 2023-07-26 ENCOUNTER — Ambulatory Visit (INDEPENDENT_AMBULATORY_CARE_PROVIDER_SITE_OTHER): Payer: Medicare HMO | Admitting: Family Medicine

## 2023-07-26 VITALS — BP 112/80 | HR 68 | Temp 98.1°F | Resp 18 | Ht 60.0 in

## 2023-07-26 DIAGNOSIS — M7062 Trochanteric bursitis, left hip: Secondary | ICD-10-CM

## 2023-07-26 MED ORDER — PREDNISONE 10 MG PO TABS
ORAL_TABLET | ORAL | 0 refills | Status: DC
Start: 2023-07-26 — End: 2023-12-05

## 2023-07-26 MED ORDER — METHYLPREDNISOLONE ACETATE 80 MG/ML IJ SUSP
80.0000 mg | Freq: Once | INTRAMUSCULAR | Status: AC
Start: 2023-07-26 — End: 2023-07-26
  Administered 2023-07-26: 80 mg via INTRAMUSCULAR

## 2023-07-26 NOTE — Progress Notes (Signed)
Established Patient Office Visit  Subjective   Patient ID: [name], female    DOB: Sep 19, 1946  Age: 78 y.o. MRN: [id number]  Chief Complaint  Patient presents with   Hip Pain    Left hip, no fall or injuries, pt having pain with walking. Pt states she was using the peddle machine at home.    HPI Discussed the use of AI scribe software for clinical note transcription with the patient, who gave verbal consent to proceed.  History of Present Illness   The patient, with a history of left hip bursitis, presents with worsening left hip pain. She reports that her hip was improving until she began using an exercise bike last week, which exacerbated her symptoms. The pain is localized to the left hip and is severe enough to limit mobility. She has been using Salonpas patches, which provide some relief. She has not taken any NSAIDs due to her cardiac history and has been managing the pain with Tylenol, which she reports is not very effective. She also reports a history of balance issues, which have improved significantly.       Patient Active Problem List   Diagnosis Date Noted   Trochanteric bursitis of left hip 07/26/2023   Abscess 01/27/2023   Dermatomyositis (HCC) 02/02/2022   Adrenal nodule (HCC) 02/02/2022   Preventative health care 04/30/2021   Hyperlipidemia associated with type 2 diabetes mellitus (HCC) 09/02/2020   Uncontrolled type 2 diabetes mellitus with hyperglycemia (HCC) 09/02/2020   Acute on chronic combined systolic and diastolic CHF (congestive heart failure) (HCC) 01/29/2020   S/P TAVR (transcatheter aortic valve replacement) 01/29/2020   Type 2 diabetes mellitus with complication (HCC)    Severe aortic stenosis    CKD (chronic kidney disease) stage 3, GFR 30-59 ml/min (HCC)    ICD (implantable cardioverter-defibrillator) in place 08/15/2019   Ventricular tachycardia (HCC)    Anemia 06/27/2019   Severe obesity (BMI 35.0-35.9 with comorbidity) (HCC) 12/08/2018    Diabetes mellitus, type II (HCC) 04/24/2015   Paroxysmal atrial flutter (HCC) 10/07/2013   GERD 01/15/2011   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, status post graft repair the ascending aorta 10/24/2007   Hyperlipidemia LDL goal <100 05/23/2007   Anxiety 05/23/2007   Primary hypertension 05/23/2007   Past Medical History:  Diagnosis Date   Acute thoracic aortic dissection (HCC) 2008   emergency surgery - Gerhardt   AICD (automatic cardioverter/defibrillator) present    Anxiety    Cancer (HCC)    skin   CKD (chronic kidney disease) stage 3, GFR 30-59 ml/min (HCC)    Diabetes mellitus    Diverticulosis    GERD (gastroesophageal reflux disease)    Hemorrhoids    History of kidney stones    Hyperlipidemia    Hypertension    Kidney stones    S/P TAVR (transcatheter aortic valve replacement) 01/29/2020   s/p TAVR with a 23 mm Edwards Sapien 3 Ultra via the TA approach wtih Dr. Excell Seltzer and Dr. Laneta Simmers   Severe aortic stenosis    Sinus node dysfunction (HCC)    hx of PPM   Sustained ventricular tachycardia (HCC)    s/p ICD 05/2019   Past Surgical History:  Procedure Laterality Date   ABDOMINAL HYSTERECTOMY  1984   APPENDECTOMY     EYE SURGERY  2014   Rght eye   ICD IMPLANT N/A 06/28/2019   Procedure: PPM upgrade to ICD;  Surgeon: Lewayne Bunting  W, MD;  Location: [facility] INVASIVE CV LAB;  Service: Cardiovascular;  Laterality: N/A;   PACEMAKER GENERATOR CHANGE  2014   Medtronic adapta   PACEMAKER GENERATOR CHANGE N/A 08/29/2013   Procedure: PACEMAKER GENERATOR CHANGE;  Surgeon: Thurmon Fair, MD;  Location: [facility] CATH LAB;  Service: Cardiovascular;  Laterality: N/A;   PACEMAKER INSERTION  08/2006   dual chamber Medtronic EnRhythm; r/t sinus node dysfunction    REPAIR OF ACUTE ASCENDING THORACIC AORTIC DISSECTION  2008   Dr. Tyrone Sage   RIGHT/LEFT HEART CATH AND CORONARY ANGIOGRAPHY N/A 01/09/2020   Procedure: RIGHT/LEFT HEART CATH AND  CORONARY ANGIOGRAPHY;  Surgeon: Tonny Bollman, MD;  Location: [facility] INVASIVE CV LAB;  Service: Cardiovascular;  Laterality: N/A;   TEE WITHOUT CARDIOVERSION N/A 01/29/2020   Procedure: TRANSESOPHAGEAL ECHOCARDIOGRAM (TEE);  Surgeon: Tonny Bollman, MD;  Location: [facility] OR;  Service: Open Heart Surgery;  Laterality: N/A;   THORACIC AORTIC ANEURYSM REPAIR  2000   type III   TRANSCATHETER AORTIC VALVE REPLACEMENT, TRANSAPICAL N/A 01/29/2020   Procedure: TRANSCATHETER AORTIC VALVE REPLACEMENT, TRANSAPICAL using a Edwards Sapien 3, 23mm  Transcatheter Heart Valve;  Surgeon: Tonny Bollman, MD;  Location: [facility] OR;  Service: Open Heart Surgery;  Laterality: N/A;   TRANSTHORACIC ECHOCARDIOGRAM  09/20/2012   EF=>55% with mild conc LVH; LA mod dilated; RA mildly dilated; mild MR/TR/AR   Social History   Tobacco Use   Smoking status: Former    Current packs/day: 0.00    Types: Cigarettes    Quit date: 09/04/1993    Years since quitting: 29.9   Smokeless tobacco: Never  Vaping Use   Vaping status: Never Used  Substance Use Topics   Alcohol use: No   Drug use: No   Social History   Socioeconomic History   Marital status: Widowed    Spouse name: Not on file   Number of children: 2   Years of education: Not on file   Highest education level: 12th grade  Occupational History    Employer: RETIRED  Tobacco Use   Smoking status: Former    Current packs/day: 0.00    Types: Cigarettes    Quit date: 09/04/1993    Years since quitting: 29.9   Smokeless tobacco: Never  Vaping Use   Vaping status: Never Used  Substance and Sexual Activity   Alcohol use: No   Drug use: No   Sexual activity: Not Currently  Other Topics Concern   Not on file  Social History Narrative   Lives at home and husband was just moved to [place]   Social Determinants of Health   Financial Resource Strain: Low Risk  (06/05/2023)   Overall Financial Resource Strain (CARDIA)    Difficulty of Paying Living Expenses: Not  hard at all  Food Insecurity: No Food Insecurity (06/05/2023)   Hunger Vital Sign    Worried About Running Out of Food in the Last Year: Never true    Ran Out of Food in the Last Year: Never true  Transportation Needs: No Transportation Needs (06/05/2023)   PRAPARE - Administrator, Civil Service (Medical): No    Lack of Transportation (Non-Medical): No  Physical Activity: Insufficiently Active (06/05/2023)   Exercise Vital Sign    Days of Exercise per Week: 3 days    Minutes of Exercise per Session: 30 min  Stress: Stress Concern Present (06/05/2023)   [employer] of Occupational Health - Occupational Stress Questionnaire    Feeling of Stress : To some extent  Social Connections: Unknown (06/05/2023)   Social Connection and Isolation Panel [NHANES]    Frequency of Communication with Friends and Family: More than three times a week    Frequency of Social Gatherings with Friends and Family: Twice a week    Attends Religious Services: Patient declined    Database administrator or Organizations: Patient declined    Attends Banker Meetings: Never    Marital Status: Widowed  Intimate Partner Violence: Not At Risk (11/03/2022)   Humiliation, Afraid, Rape, and Kick questionnaire    Fear of Current or Ex-Partner: No    Emotionally Abused: No    Physically Abused: No    Sexually Abused: No   Family Status  Relation Name Status   Mother  Deceased at age 58       kidney failure   Father  Deceased at age 50       MI   MGM  Deceased at age 66       MI   Son  (Not Specified)   MGF  Deceased   PGM  Deceased   PGF  Deceased   Neg Hx  (Not Specified)  No partnership data on file   Family History  Problem Relation Age of Onset   Kidney disease Mother    Heart attack Father    Heart attack Maternal Grandmother    Valvular heart disease Son        valve replacement at 104   Cancer Neg Hx    Allergies  Allergen Reactions   Oxycodone Itching   Vicodin  [Hydrocodone-Acetaminophen] Itching   Latex Itching and Rash   Neomycin-Bacitracin Zn-Polymyx Other (See Comments)    Red , swollen eye   Sulfonamide Derivatives Other (See Comments)    Just had reaction when taking during pregnancy- last child birth 89      Review of Systems  Constitutional:  Negative for fever and malaise/fatigue.  HENT:  Negative for congestion.   Eyes:  Negative for blurred vision.  Respiratory:  Negative for shortness of breath.   Cardiovascular:  Negative for palpitations and leg swelling.  Gastrointestinal:  Negative for abdominal pain, blood in stool and nausea.  Genitourinary:  Negative for dysuria and frequency.  Musculoskeletal:  Positive for joint pain. Negative for falls.  Skin:  Negative for rash.  Neurological:  Negative for dizziness, loss of consciousness and headaches.  Endo/Heme/Allergies:  Negative for environmental allergies.  Psychiatric/Behavioral:  Negative for depression. The patient is not nervous/anxious.       Objective:     BP 112/80 (BP Location: Left Arm, Patient Position: Sitting, Cuff Size: Large)   Pulse 68   Temp 98.1 F (36.7 C) (Oral)   Resp 18   Ht 5' (1.524 m)   SpO2 94%   BMI 36.13 kg/m  BP Readings from Last 3 Encounters:  07/26/23 112/80  06/06/23 118/80  03/17/23 120/76   Wt Readings from Last 3 Encounters:  06/06/23 185 lb (83.9 kg)  03/17/23 178 lb 12.8 oz (81.1 kg)  01/27/23 179 lb 12.8 oz (81.6 kg)   SpO2 Readings from Last 3 Encounters:  07/26/23 94%  06/06/23 98%  03/17/23 94%      Physical Exam Vitals and nursing note reviewed.  Musculoskeletal:        General: Tenderness present.     Left hip: Tenderness and bony tenderness present. Decreased range of motion.       Legs:      No results found  for any visits on 07/26/23.    The 10-year ASCVD risk score (Arnett DK, et al., 2019) is: 32.3%    Assessment & Plan:   Problem List Items Addressed This Visit       Unprioritized    Trochanteric bursitis of left hip - Primary    Depo medrol 80 I'm  Pred taper  Ice/ heat alternate Can use salon pas patches       Relevant Medications   predniSONE (DELTASONE) 10 MG tablet   methylPREDNISolone acetate (DEPO-MEDROL) injection 80 mg    No follow-ups on file.    Donato Schultz, DO

## 2023-07-26 NOTE — Assessment & Plan Note (Signed)
Depo medrol 80 I'm  Pred taper  Ice/ heat alternate Can use salon pas patches

## 2023-08-06 ENCOUNTER — Other Ambulatory Visit: Payer: Self-pay | Admitting: Family Medicine

## 2023-08-06 DIAGNOSIS — F419 Anxiety disorder, unspecified: Secondary | ICD-10-CM

## 2023-08-08 NOTE — Telephone Encounter (Signed)
Requesting: alprazolam 0.5mg   Contract: 11/02/22 UDS: 10/12/22 Last Visit: 06/06/23 Next Visit: None Last Refill: 07/08/23 #90 and 0RF   Please Advise

## 2023-08-09 MED ORDER — ALPRAZOLAM 0.5 MG PO TABS
0.5000 mg | ORAL_TABLET | Freq: Three times a day (TID) | ORAL | 0 refills | Status: DC | PRN
Start: 2023-08-09 — End: 2023-09-16

## 2023-08-23 ENCOUNTER — Ambulatory Visit: Payer: Medicare HMO

## 2023-08-23 DIAGNOSIS — I495 Sick sinus syndrome: Secondary | ICD-10-CM

## 2023-08-23 LAB — CUP PACEART REMOTE DEVICE CHECK
Battery Remaining Longevity: 38 mo
Battery Voltage: 2.96 V
Brady Statistic AP VP Percent: 99.35 %
Brady Statistic AP VS Percent: 0 %
Brady Statistic AS VP Percent: 0.65 %
Brady Statistic AS VS Percent: 0 %
Brady Statistic RA Percent Paced: 99.3 %
Brady Statistic RV Percent Paced: 99.96 %
Date Time Interrogation Session: 20240924022824
HighPow Impedance: 91 Ohm
Implantable Lead Connection Status: 753985
Implantable Lead Connection Status: 753985
Implantable Lead Connection Status: 753985
Implantable Lead Implant Date: 20071016
Implantable Lead Implant Date: 20071016
Implantable Lead Implant Date: 20200730
Implantable Lead Location: 753859
Implantable Lead Location: 753860
Implantable Lead Location: 753860
Implantable Lead Model: 4092
Implantable Lead Model: 5594
Implantable Lead Model: 6935
Implantable Pulse Generator Implant Date: 20200730
Lead Channel Impedance Value: 437 Ohm
Lead Channel Impedance Value: 513 Ohm
Lead Channel Impedance Value: 608 Ohm
Lead Channel Pacing Threshold Amplitude: 0.5 V
Lead Channel Pacing Threshold Amplitude: 0.75 V
Lead Channel Pacing Threshold Pulse Width: 0.4 ms
Lead Channel Pacing Threshold Pulse Width: 0.4 ms
Lead Channel Sensing Intrinsic Amplitude: 26.375 mV
Lead Channel Sensing Intrinsic Amplitude: 26.375 mV
Lead Channel Sensing Intrinsic Amplitude: 4.125 mV
Lead Channel Sensing Intrinsic Amplitude: 4.125 mV
Lead Channel Setting Pacing Amplitude: 1.75 V
Lead Channel Setting Pacing Amplitude: 2.5 V
Lead Channel Setting Pacing Pulse Width: 0.4 ms
Lead Channel Setting Sensing Sensitivity: 0.3 mV
Zone Setting Status: 755011
Zone Setting Status: 755011

## 2023-09-07 ENCOUNTER — Other Ambulatory Visit: Payer: Self-pay | Admitting: Family Medicine

## 2023-09-07 ENCOUNTER — Other Ambulatory Visit: Payer: Self-pay | Admitting: Cardiovascular Disease

## 2023-09-07 DIAGNOSIS — E1165 Type 2 diabetes mellitus with hyperglycemia: Secondary | ICD-10-CM

## 2023-09-09 NOTE — Progress Notes (Signed)
Remote ICD transmission.   

## 2023-09-15 ENCOUNTER — Other Ambulatory Visit: Payer: Self-pay | Admitting: Family Medicine

## 2023-09-15 DIAGNOSIS — F419 Anxiety disorder, unspecified: Secondary | ICD-10-CM

## 2023-09-16 NOTE — Telephone Encounter (Signed)
Duplicate request

## 2023-09-16 NOTE — Telephone Encounter (Signed)
Requesting: alprazolam 0.5mg   Contract: 11/02/22 UDS: 10/12/22 Last Visit: 06/06/23 Next Visit: 10/17/23 Last Refill: 08/09/23 #90 and 0RF   Please Advise

## 2023-09-22 ENCOUNTER — Other Ambulatory Visit: Payer: Self-pay | Admitting: Cardiovascular Disease

## 2023-09-22 ENCOUNTER — Encounter: Payer: Self-pay | Admitting: Cardiovascular Disease

## 2023-09-23 ENCOUNTER — Other Ambulatory Visit: Payer: Self-pay | Admitting: Cardiovascular Disease

## 2023-09-23 DIAGNOSIS — I10 Essential (primary) hypertension: Secondary | ICD-10-CM

## 2023-09-28 ENCOUNTER — Other Ambulatory Visit: Payer: Self-pay | Admitting: Family Medicine

## 2023-10-15 ENCOUNTER — Other Ambulatory Visit: Payer: Self-pay | Admitting: Cardiovascular Disease

## 2023-10-17 ENCOUNTER — Ambulatory Visit: Payer: Medicare HMO | Admitting: Family Medicine

## 2023-10-18 ENCOUNTER — Ambulatory Visit: Payer: Medicare HMO | Admitting: Family Medicine

## 2023-10-21 ENCOUNTER — Other Ambulatory Visit: Payer: Self-pay | Admitting: Family Medicine

## 2023-10-21 ENCOUNTER — Other Ambulatory Visit: Payer: Self-pay | Admitting: Cardiovascular Disease

## 2023-10-21 DIAGNOSIS — F419 Anxiety disorder, unspecified: Secondary | ICD-10-CM

## 2023-10-21 DIAGNOSIS — I10 Essential (primary) hypertension: Secondary | ICD-10-CM

## 2023-10-24 ENCOUNTER — Telehealth: Payer: Self-pay | Admitting: Family Medicine

## 2023-10-24 NOTE — Telephone Encounter (Signed)
Requesting: Xanax 0.5 mg  Contract: 10/12/2022 UDS: 10/12/2022 Last Visit: 07/26/2023 Next Visit: 11/01/2023 Last Refill: 09/16/2023  Please Advise

## 2023-10-24 NOTE — Telephone Encounter (Signed)
Pt called stating that she is going out of town tomorrow and needs to have this medication refilled before she leaves.

## 2023-10-24 NOTE — Telephone Encounter (Signed)
Patient called to follow up on her alprazolam refill. Pt is waiting on it so she can pick it up before she goes out of town. Please send to [facility] on [address]. Please let pt know when ready

## 2023-10-24 NOTE — Telephone Encounter (Signed)
Request sent to [facility] already

## 2023-11-01 ENCOUNTER — Ambulatory Visit: Payer: Medicare HMO | Admitting: Family Medicine

## 2023-11-01 ENCOUNTER — Ambulatory Visit (INDEPENDENT_AMBULATORY_CARE_PROVIDER_SITE_OTHER): Payer: Medicare HMO

## 2023-11-01 ENCOUNTER — Other Ambulatory Visit: Payer: Self-pay

## 2023-11-01 ENCOUNTER — Other Ambulatory Visit (INDEPENDENT_AMBULATORY_CARE_PROVIDER_SITE_OTHER): Payer: Medicare HMO

## 2023-11-01 DIAGNOSIS — N1832 Chronic kidney disease, stage 3b: Secondary | ICD-10-CM

## 2023-11-01 DIAGNOSIS — E1165 Type 2 diabetes mellitus with hyperglycemia: Secondary | ICD-10-CM

## 2023-11-01 DIAGNOSIS — Z23 Encounter for immunization: Secondary | ICD-10-CM

## 2023-11-01 DIAGNOSIS — E1169 Type 2 diabetes mellitus with other specified complication: Secondary | ICD-10-CM

## 2023-11-01 DIAGNOSIS — Z79899 Other long term (current) drug therapy: Secondary | ICD-10-CM

## 2023-11-01 DIAGNOSIS — E785 Hyperlipidemia, unspecified: Secondary | ICD-10-CM

## 2023-11-01 LAB — LIPID PANEL
Cholesterol: 180 mg/dL (ref 0–200)
HDL: 55.1 mg/dL (ref >39.00)
LDL Cholesterol: 104 mg/dL — ABNORMAL HIGH (ref 0–99)
NonHDL: 125.36
Total CHOL/HDL Ratio: 3
Triglycerides: 107 mg/dL (ref 0.0–149.0)
VLDL: 21.4 mg/dL (ref 0.0–40.0)

## 2023-11-01 LAB — COMPREHENSIVE METABOLIC PANEL
ALT: 10 U/L (ref 0–35)
AST: 13 U/L (ref 0–37)
Albumin: 4.2 g/dL (ref 3.5–5.2)
Alkaline Phosphatase: 41 U/L (ref 39–117)
BUN: 30 mg/dL — ABNORMAL HIGH (ref 6–23)
CO2: 29 meq/L (ref 19–32)
Calcium: 9.6 mg/dL (ref 8.4–10.5)
Chloride: 105 meq/L (ref 96–112)
Creatinine, Ser: 1.4 mg/dL — ABNORMAL HIGH (ref 0.40–1.20)
GFR: 36.4 mL/min — ABNORMAL LOW (ref >60.00)
Glucose, Bld: 107 mg/dL — ABNORMAL HIGH (ref 70–99)
Potassium: 5.6 meq/L — ABNORMAL HIGH (ref 3.5–5.1)
Sodium: 140 meq/L (ref 135–145)
Total Bilirubin: 0.6 mg/dL (ref 0.2–1.2)
Total Protein: 6.6 g/dL (ref 6.0–8.3)

## 2023-11-01 LAB — HEMOGLOBIN A1C: Hgb A1c MFr Bld: 5.8 % (ref 4.6–6.5)

## 2023-11-01 NOTE — Progress Notes (Signed)
Pt here for flu injection per Dr Laury Axon  Fluad 0.5 mL was given IM, R Deltoid, and pt tolerated injection well.  Given VIS and Consent form.    Wellington Hampshire, RMA

## 2023-11-03 LAB — DRUG MONITORING PANEL 376104, URINE
Alphahydroxyalprazolam: 520 ng/mL — ABNORMAL HIGH (ref <25)
Alphahydroxymidazolam: NEGATIVE ng/mL (ref <50)
Alphahydroxytriazolam: NEGATIVE ng/mL (ref <50)
Aminoclonazepam: NEGATIVE ng/mL (ref <25)
Benzodiazepines: POSITIVE ng/mL — AB (ref <100)
Cocaine Metabolite: NEGATIVE ng/mL (ref <150)
Desmethyltramadol: NEGATIVE ng/mL (ref <100)
Hydroxyethylflurazepam: NEGATIVE ng/mL (ref <50)
Lorazepam: NEGATIVE ng/mL (ref <50)
Nordiazepam: NEGATIVE ng/mL (ref <50)
Opiates: NEGATIVE ng/mL (ref <100)
Oxazepam: NEGATIVE ng/mL (ref <50)
Oxycodone: NEGATIVE ng/mL (ref <100)
Temazepam: NEGATIVE ng/mL (ref <50)
Tramadol Comments: NEGATIVE ng/mL (ref <300)
Tramadol: NEGATIVE ng/mL (ref <100)
Tramadol: NEGATIVE ng/mL (ref <500)
medMATCH Summary: NEGATIVE ng/mL (ref <100)

## 2023-11-03 LAB — DM TEMPLATE

## 2023-11-15 ENCOUNTER — Other Ambulatory Visit: Payer: Self-pay | Admitting: Family Medicine

## 2023-11-15 DIAGNOSIS — F419 Anxiety disorder, unspecified: Secondary | ICD-10-CM

## 2023-11-15 NOTE — Telephone Encounter (Signed)
Requesting: Xanax Contract: 11/01/23 UDS: 11/01/23 Last OV: 07/26/23 Next OV: 12/16/23 Last Refill: 10/24/23, #90--0 RF Database:     Please advise

## 2023-11-16 ENCOUNTER — Emergency Department (HOSPITAL_BASED_OUTPATIENT_CLINIC_OR_DEPARTMENT_OTHER): Payer: Medicare HMO

## 2023-11-16 ENCOUNTER — Telehealth: Payer: Self-pay | Admitting: Physician Assistant

## 2023-11-16 ENCOUNTER — Emergency Department (HOSPITAL_BASED_OUTPATIENT_CLINIC_OR_DEPARTMENT_OTHER)
Admission: EM | Admit: 2023-11-16 | Discharge: 2023-11-16 | Disposition: A | Discharge status: Home / Self Care | Payer: Medicare HMO | Attending: Emergency Medicine | Admitting: Emergency Medicine

## 2023-11-16 ENCOUNTER — Encounter: Payer: Self-pay | Admitting: Cardiovascular Disease

## 2023-11-16 ENCOUNTER — Encounter (HOSPITAL_BASED_OUTPATIENT_CLINIC_OR_DEPARTMENT_OTHER): Payer: Self-pay

## 2023-11-16 DIAGNOSIS — Z91040 Latex allergy status: Secondary | ICD-10-CM | POA: Diagnosis not present

## 2023-11-16 DIAGNOSIS — D72829 Elevated white blood cell count, unspecified: Secondary | ICD-10-CM | POA: Insufficient documentation

## 2023-11-16 DIAGNOSIS — Z7982 Long term (current) use of aspirin: Secondary | ICD-10-CM | POA: Diagnosis not present

## 2023-11-16 DIAGNOSIS — N189 Chronic kidney disease, unspecified: Secondary | ICD-10-CM | POA: Diagnosis not present

## 2023-11-16 DIAGNOSIS — N3000 Acute cystitis without hematuria: Secondary | ICD-10-CM | POA: Diagnosis not present

## 2023-11-16 DIAGNOSIS — I129 Hypertensive chronic kidney disease with stage 1 through stage 4 chronic kidney disease, or unspecified chronic kidney disease: Secondary | ICD-10-CM | POA: Diagnosis not present

## 2023-11-16 DIAGNOSIS — Z79899 Other long term (current) drug therapy: Secondary | ICD-10-CM | POA: Diagnosis not present

## 2023-11-16 DIAGNOSIS — R9431 Abnormal electrocardiogram [ECG] [EKG]: Secondary | ICD-10-CM | POA: Diagnosis not present

## 2023-11-16 DIAGNOSIS — H81399 Other peripheral vertigo, unspecified ear: Secondary | ICD-10-CM | POA: Insufficient documentation

## 2023-11-16 DIAGNOSIS — R0789 Other chest pain: Secondary | ICD-10-CM | POA: Insufficient documentation

## 2023-11-16 DIAGNOSIS — R42 Dizziness and giddiness: Secondary | ICD-10-CM | POA: Diagnosis not present

## 2023-11-16 DIAGNOSIS — I700 Atherosclerosis of aorta: Secondary | ICD-10-CM | POA: Diagnosis not present

## 2023-11-16 DIAGNOSIS — R531 Weakness: Secondary | ICD-10-CM | POA: Diagnosis not present

## 2023-11-16 DIAGNOSIS — R079 Chest pain, unspecified: Secondary | ICD-10-CM | POA: Diagnosis not present

## 2023-11-16 DIAGNOSIS — I639 Cerebral infarction, unspecified: Secondary | ICD-10-CM | POA: Diagnosis not present

## 2023-11-16 DIAGNOSIS — I959 Hypotension, unspecified: Secondary | ICD-10-CM | POA: Diagnosis not present

## 2023-11-16 LAB — BASIC METABOLIC PANEL
Anion gap: 10 (ref 5–15)
Anion gap: 10 (ref 5–15)
BUN: 32 mg/dL — ABNORMAL HIGH (ref 8–23)
BUN: 32 mg/dL — ABNORMAL HIGH (ref 8–23)
CO2: 26 mmol/L (ref 22–32)
CO2: 25 mmol/L (ref 22–32)
Calcium: 10.3 mg/dL (ref 8.9–10.3)
Calcium: 10.2 mg/dL (ref 8.9–10.3)
Chloride: 100 mmol/L (ref 98–111)
Chloride: 100 mmol/L (ref 98–111)
Creatinine, Ser: 1.47 mg/dL — ABNORMAL HIGH (ref 0.44–1.00)
Creatinine, Ser: 1.46 mg/dL — ABNORMAL HIGH (ref 0.44–1.00)
GFR, Estimated: 37 mL/min — ABNORMAL LOW (ref >60)
GFR, Estimated: 37 mL/min — ABNORMAL LOW (ref >60)
Glucose, Bld: 90 mg/dL (ref 70–99)
Glucose, Bld: 89 mg/dL (ref 70–99)
Potassium: 5.5 mmol/L — ABNORMAL HIGH (ref 3.5–5.1)
Potassium: 4.9 mmol/L (ref 3.5–5.1)
Sodium: 136 mmol/L (ref 135–145)
Sodium: 135 mmol/L (ref 135–145)

## 2023-11-16 LAB — URINALYSIS, MICROSCOPIC (REFLEX)

## 2023-11-16 LAB — CBC
HCT: 38 % (ref 36.0–46.0)
Hemoglobin: 12.8 g/dL (ref 12.0–15.0)
MCH: 35.3 pg — ABNORMAL HIGH (ref 26.0–34.0)
MCHC: 33.7 g/dL (ref 30.0–36.0)
MCV: 104.7 fL — ABNORMAL HIGH (ref 80.0–100.0)
Platelets: 168 10*3/uL (ref 150–400)
RBC: 3.63 MIL/uL — ABNORMAL LOW (ref 3.87–5.11)
RDW: 13.1 % (ref 11.5–15.5)
WBC: 6.2 10*3/uL (ref 4.0–10.5)
nRBC: 0 % (ref 0.0–0.2)

## 2023-11-16 LAB — URINALYSIS, ROUTINE W REFLEX MICROSCOPIC
Bilirubin Urine: NEGATIVE
Glucose, UA: >=500 mg/dL — AB
Hgb urine dipstick: NEGATIVE
Ketones, ur: NEGATIVE mg/dL
Nitrite: NEGATIVE
Protein, ur: NEGATIVE mg/dL
Specific Gravity, Urine: 1.015 (ref 1.005–1.030)
pH: 8 (ref 5.0–8.0)

## 2023-11-16 LAB — TROPONIN I (HIGH SENSITIVITY)
Troponin I (High Sensitivity): 11 ng/L (ref <18)
Troponin I (High Sensitivity): 12 ng/L (ref <18)

## 2023-11-16 MED ORDER — MECLIZINE HCL 25 MG PO TABS: 25.0000 mg | ORAL_TABLET | Freq: Three times a day (TID) | ORAL | 0 refills | Status: AC | PRN

## 2023-11-16 MED ORDER — MECLIZINE HCL 25 MG PO TABS
25.0000 mg | ORAL_TABLET | Freq: Once | ORAL | Status: AC
  Administered 2023-11-16: 25 mg via ORAL
  Filled 2023-11-16: qty 1

## 2023-11-16 MED ORDER — CEPHALEXIN 250 MG PO CAPS
500.0000 mg | ORAL_CAPSULE | Freq: Once | ORAL | Status: AC
  Administered 2023-11-16: 500 mg via ORAL
  Filled 2023-11-16: qty 2

## 2023-11-16 MED ORDER — CEPHALEXIN 500 MG PO CAPS: 500.0000 mg | ORAL_CAPSULE | Freq: Four times a day (QID) | ORAL | 0 refills | Status: AC

## 2023-11-16 MED ORDER — LACTATED RINGERS IV BOLUS
500.0000 mL | Freq: Once | INTRAVENOUS | Status: AC
  Administered 2023-11-16: 500 mL via INTRAVENOUS

## 2023-11-16 NOTE — ED Notes (Signed)
[employer] pacemaker interrogated °

## 2023-11-16 NOTE — ED Triage Notes (Addendum)
Pt reports low blood pressure at home. States that she is dizzy. States that her balance is off. Denies nausea, vomiting. Pain on left side under breast. Pt states that she is weak and is not able to stand very long. Pt has pacemaker and defibrillator.

## 2023-11-16 NOTE — Discharge Instructions (Signed)
As discussed, your imaging and labs are reassuring.  I have sent a prescription of meclizine to your pharmacy.  You can take this up to 3 times a day as needed for dizziness.  I have also sent a prescription for Keflex.  Take this 4 times a day for the next 7 days for urinary tract infection.  Follow-up with your primary care provider in the next 2-3 to days for reevaluation of your symptoms.  Get help right away if you: Have difficulty speaking or moving. Are always dizzy or faint. Develop severe headaches. Have weakness in your legs or arms. Have changes in your hearing or vision. Develop a stiff neck. Develop sensitivity to light.

## 2023-11-16 NOTE — Telephone Encounter (Signed)
Pt called stating her BP is so low she can't function. BP is 102/66, recheck BP 119/67. She is dizzy and cold. Her ICD has not fired. She also has episodes of being hot. She has increased her fluids - orange juice and water. Her BG is "ok."  She did not take her blood pressure medications today. I asked her about fever and sick contacts. She does not think she is sick. She is going to take tylenol to see if this helps. She is worried about her heart as her symptoms have worsened over the last 2 days. I advised going to the ER for device check and BP check. She agrees with this.  She wants Dr. Royann Shivers to know.

## 2023-11-16 NOTE — ED Notes (Signed)
To CT family in room

## 2023-11-16 NOTE — ED Provider Notes (Signed)
[facility] [place] Provider Note   CSN: [id number] Arrival date & time: 11/16/23  1648     History  No chief complaint on file.   [name] is a 77 y.o. female with a history of AICD, aortic dissection, CKD, and diabetes mellitus who presents the ED today for dizziness.  Patient reports that she woke up feeling dizzy, kind of like she was spinning and thought her blood sugar might have been low.  She drinks more juice and took her blood pressure which was low for her, she said it was about 100/60. She states that she has felt off balance of the day as well as an aching sensation under the left side of her breast worse with movement.  She endorses is generalized weakness and denies fever, nausea, vomiting, shortness of breath, or abdominal pain. No focal neurologic deficits, vision changes, or slurred speech.  Denies recent head injury or trauma.  No additional complaints or concerns at this time.    Home Medications Prior to Admission medications   Medication Sig Start Date End Date Taking? Authorizing Provider  ALPRAZolam Prudy Feeler) 0.5 MG tablet TAKE 1 TABLET BY MOUTH THREE TIMES A DAY AS NEEDED 11/15/23   Zola Button, Grayling Congress, DO  cephALEXin (KEFLEX) 500 MG capsule Take 1 capsule (500 mg total) by mouth 4 (four) times daily for 7 days. 11/16/23 11/23/23 Yes Maxwell Marion, PA-C  furosemide (LASIX) 40 MG tablet Take 40 mg  as needed only for edema/weight gain >3lb in 24h or >5 lb in a week 03/30/23  Yes Croitoru, Mihai, MD  glucose blood (TRUE METRIX BLOOD GLUCOSE TEST) test strip USE AS INSTRUCTED TWICE A DAY IN THE MORNING AND AT BEDTIME 09/28/23   Lowne Chase, Yvonne R, DO  JARDIANCE 10 MG TABS tablet TAKE 1 TABLET BY MOUTH EVERY DAY 10/17/23  Yes Croitoru, Mihai, MD  losartan (COZAAR) 25 MG tablet TAKE 1/2 TABLET BY MOUTH EVERY DAY AT BEDTIME 01/05/23  Yes Croitoru, Mihai, MD  meclizine (ANTIVERT) 25 MG tablet Take 1 tablet (25 mg total) by mouth  3 (three) times daily as needed for up to 14 days for dizziness. 11/16/23 11/30/23 Yes Maxwell Marion, PA-C  rosuvastatin (CRESTOR) 20 MG tablet TAKE 1 TABLET BY MOUTH EVERY DAY 05/05/23  Yes Seabron Spates R, DO  amoxicillin (AMOXIL) 500 MG tablet TAKE 4 TABLETS BY MOUTH 1 HOUR PRIOR TO DENTAL WORK, INCLUDING CLEANINGS Patient not taking: Reported on 03/17/2023 03/26/21   Tonny Bollman, MD  aspirin (ASPIRIN LOW DOSE) 81 MG chewable tablet CHEW 1 TABLET BY MOUTH EVERY DAY 09/23/23   Croitoru, Mihai, MD  Blood Glucose Calibration (TRUE METRIX LEVEL 1) Low SOLN 1 each by In Vitro route in the morning and at bedtime. 05/26/21   Donato Schultz, DO  Blood Glucose Monitoring Suppl (TRUE METRIX METER) DEVI 1 each by Does not apply route in the morning and at bedtime. 05/26/21   Donato Schultz, DO  carvedilol (COREG) 12.5 MG tablet Take 1 tablet (12.5 mg total) by mouth 2 (two) times daily with a meal. 03/17/23   Croitoru, Mihai, MD  carvedilol (COREG) 25 MG tablet TAKE 1 TABLET BY MOUTH TWICE A DAY WITH FOOD 10/24/23   Croitoru, Mihai, MD  citalopram (CELEXA) 20 MG tablet TAKE 1 TABLET EVERY DAY ALONG WITH 40MG  TO EQUAL 60MG  TOTAL 10/24/23   Zola Button, Yvonne R, DO  citalopram (CELEXA) 40 MG tablet TAKE 1 TABLET EVERY  DAY ALONG WITH 20MG  TO EQUAL 60MG  TOTAL, 09/16/23   Zola Button, Grayling Congress, DO  fluticasone [facility]) 50 MCG/ACT nasal spray USE 2 SPRAYS IN [facility] NOSTIRL EVERY DAY 06/23/18   Zola Button, Myrene Buddy R, DO  furosemide (LASIX) 20 MG tablet TAKE 1 TABLET BY MOUTH EVERY DAY 10/24/23   Croitoru, Mihai, MD  furosemide (LASIX) 40 MG tablet TAKE 1/2 TABLET BY MOUTH DAILY Patient not taking: Reported on 03/17/2023 12/13/22   Seabron Spates R, DO  hyoscyamine (LEVSIN SL) 0.125 MG SL tablet TAKE 1 TABLET BY MOUTH TWICE A DAY AS NEEDED FOR CRAMPING OR DIARRHEA OR LOOSE STOOLS Patient not taking: Reported on 03/17/2023 01/21/22   Donato Schultz, DO  Potassium Chloride ER 20 MEQ TBCR TAKE 1  TABLET BY MOUTH EVERY DAY 01/05/23   Croitoru, Rachelle Hora, MD  predniSONE (DELTASONE) 10 MG tablet TAKE 3 TABLETS PO QD FOR 3 DAYS THEN TAKE 2 TABLETS PO QD FOR 3 DAYS THEN TAKE 1 TABLET PO QD FOR 3 DAYS THEN TAKE 1/2 TAB PO QD FOR 3 DAYS 07/26/23   Donato Schultz, DO  Probiotic Product (PRO-BIOTIC BLEND PO) Take 1 tablet by mouth daily.     [provider]  Semaglutide,0.25 or 0.5MG /DOS, (OZEMPIC, 0.25 OR 0.5 MG/DOSE,) 2 MG/3ML SOPN Inject 0.5 mg into the skin once a week. 09/07/23   Seabron Spates R, DO  TRUEplus Lancets 33G MISC TEST BLOOD SUGAR TWICE DAILY IN THE MORNING AND AT BEDTIME 02/19/22   Seabron Spates R, DO      Allergies    Oxycodone, Vicodin [hydrocodone-acetaminophen], Latex, Neomycin-bacitracin zn-polymyx, and Sulfonamide derivatives    Review of Systems   Review of Systems  Neurological:  Positive for dizziness.  All other systems reviewed and are negative.   Physical Exam Updated Vital Signs BP (!) 129/52   Pulse 60   Temp 98.2 F (36.8 C)   Resp (!) 21   Ht 5' (1.524 m)   Wt 82.6 kg   SpO2 97%   BMI 35.54 kg/m  Physical Exam Vitals and nursing note reviewed.  Constitutional:      General: She is not in acute distress.    Appearance: Normal appearance.  HENT:     Head: Normocephalic and atraumatic.     Mouth/Throat:     Mouth: Mucous membranes are moist.  Eyes:     Extraocular Movements: Extraocular movements intact.     Conjunctiva/sclera: Conjunctivae normal.     Pupils: Pupils are equal, round, and reactive to light.     Comments: No nystagmus  Cardiovascular:     Rate and Rhythm: Normal rate and regular rhythm.     Pulses: Normal pulses.     Heart sounds: Normal heart sounds.  Pulmonary:     Effort: Pulmonary effort is normal.     Breath sounds: Normal breath sounds.     Comments: Tenderness to palpation under the left breast Chest:     Chest wall: Tenderness present.  Abdominal:     Palpations: Abdomen is soft.      Tenderness: There is no abdominal tenderness.  Musculoskeletal:        General: Normal range of motion.     Cervical back: Normal range of motion.  Skin:    General: Skin is warm and dry.     Findings: No rash.  Neurological:     General: No focal deficit present.     Mental Status: She is alert.  Sensory: No sensory deficit.     Motor: No weakness.  Psychiatric:        Mood and Affect: Mood normal.        Behavior: Behavior normal.    Orthostatic Lying BP- Lying: 154/61 Pulse- Lying: 74 Orthostatic Sitting BP- Sitting: 140/66 Pulse- Sitting: 73 Orthostatic Standing at 0 minutes BP- Standing at 0 minutes: 148/79 Pulse- Standing at 0 minutes: 75 ED Results / Procedures / Treatments   Labs (all labs ordered are listed, but only abnormal results are displayed) Labs Reviewed  BASIC METABOLIC PANEL - Abnormal; Notable for the following components:      Result Value   Potassium 5.5 (*)    BUN 32 (*)    Creatinine, Ser 1.47 (*)    GFR, Estimated 37 (*)    All other components within normal limits  CBC - Abnormal; Notable for the following components:   RBC 3.63 (*)    MCV 104.7 (*)    MCH 35.3 (*)    All other components within normal limits  URINALYSIS, ROUTINE W REFLEX MICROSCOPIC - Abnormal; Notable for the following components:   Glucose, UA >=500 (*)    Leukocytes,Ua SMALL (*)    All other components within normal limits  BASIC METABOLIC PANEL - Abnormal; Notable for the following components:   BUN 32 (*)    Creatinine, Ser 1.46 (*)    GFR, Estimated 37 (*)    All other components within normal limits  URINALYSIS, MICROSCOPIC (REFLEX) - Abnormal; Notable for the following components:   Bacteria, UA RARE (*)    All other components within normal limits  URINE CULTURE  TROPONIN I (HIGH SENSITIVITY)  TROPONIN I (HIGH SENSITIVITY)    EKG EKG Interpretation Date/Time:  Wednesday November 16 2023 17:04:22 EST Ventricular Rate:  60 PR Interval:  178 QRS  Duration:  190 QT Interval:  490 QTC Calculation: 490 R Axis:   -69  Text Interpretation: AV dual-paced rhythm Abnormal ECG Confirmed by Alvino Blood ([id number]) on 11/16/2023 5:36:19 PM  Radiology DG Chest Portable 1 View Result Date: 11/16/2023 CLINICAL DATA:  Chest pain and low blood pressure EXAM: PORTABLE CHEST 1 VIEW COMPARISON:  CTA chest 03/08/2023 and radiographs 01/31/2020 FINDINGS: Left chest wall ICD. TAVR. Sternotomy. Stable cardiomediastinal silhouette. Aortic atherosclerotic calcification. Scarring or atelectasis in the left mid lung. The lungs are otherwise clear. And IMPRESSION: No active disease. Electronically Signed   By: Minerva Fester M.D.   On: 11/16/2023 19:02   CT Head Wo Contrast Result Date: 11/16/2023 CLINICAL DATA:  Vertigo, peripheral dizziness EXAM: CT HEAD WITHOUT CONTRAST TECHNIQUE: Contiguous axial images were obtained from the base of the skull through the vertex without intravenous contrast. RADIATION DOSE REDUCTION: This exam was performed according to the departmental dose-optimization program which includes automated exposure control, adjustment of the mA and/or kV according to patient size and/or use of iterative reconstruction technique. COMPARISON:  06/27/2019 FINDINGS: Brain: No evidence of acute infarction, hemorrhage, mass, mass effect, or midline shift. No hydrocephalus or extra-axial fluid collection. 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 Vascular: No hyperdense vessel. Skull: Negative for fracture or focal lesion. Sinuses/Orbits: Mucosal thickening in the maxillary sinuses and right frontal sinus. Status post bilateral lens replacements. Other: The mastoid air cells are well aerated. IMPRESSION: No acute intracranial process. Electronically Signed   By: Wiliam Ke M.D.   On: 11/16/2023 18:51    Procedures Procedures: not indicated.   Medications Ordered in ED Medications  cephALEXin (KEFLEX) capsule 500 mg (  has no administration in time  range)  lactated ringers bolus 500 mL (0 mLs Intravenous Stopped 11/16/23 1958)  meclizine (ANTIVERT) tablet 25 mg (25 mg Oral Given 11/16/23 1902)    ED Course/ Medical Decision Making/ A&P                                 Medical Decision Making Amount and/or Complexity of Data Reviewed Labs: ordered. Radiology: ordered.   This patient presents to the ED for concern of dizziness, this involves an extensive number of treatment options, and is a complaint that carries with it a high risk of complications and morbidity.   Differential diagnosis includes: BPPV, Mnire's disease, vestibular neuritis, dehydration, electrolyte derangement, UTI, ACS, costochondritis, muscle strain,   Comorbidities  See HPI above   Additional History  Additional history obtained from prior records.   Cardiac Monitoring / EKG  The patient was maintained on a cardiac monitor.  I personally viewed and interpreted the cardiac monitored which showed: AV dual-paced rhythm with a heart rate of 60 bpm.   Lab Tests  I ordered and personally interpreted labs.  The pertinent results include:   Initial troponin of 11, delta troponin of 12 BUN of 32, creatinine of 1.46, otherwise BMP is within normal limits CBC is within normal limits for patient no acute anemia or infection Urine shows small leukocytes and rare bacteria.  Culture pending.   Imaging Studies  I ordered imaging studies including CXR and CT head  I independently visualized and interpreted imaging which showed:  No acute intracranial processes. No active cardiopulmonary disease noted on chest x-ray. I agree with the radiologist interpretation   Problem List / ED Course / Critical Interventions / Medication Management  Dizziness and chest pain I ordered medications including: Meclizine for dizziness 500 mL lactated ringer bolus  Reevaluation of the patient after these medicines showed that the patient improved. Patient's states  that her dizziness has improved greatly but still feels some with standing.  She is able to ambulate independently.  Orthostatic vital signs were negative. I have reviewed the patients home medicines and have made adjustments as needed   Social Determinants of Health  Housing    Test / Admission - Considered  Discussed findings with patient. All questions were answered. She is stable and safe for discharge home.  Prescriptions for meclizine and Keflex sent to the pharmacy. Return precautions given.        Final Clinical Impression(s) / ED Diagnoses Final diagnoses:  Peripheral vertigo, unspecified laterality  Acute cystitis without hematuria    Rx / DC Orders ED Discharge Orders          Ordered    cephALEXin (KEFLEX) 500 MG capsule  4 times daily        11/16/23 2103    meclizine (ANTIVERT) 25 MG tablet  3 times daily PRN        11/16/23 2103              Maxwell Marion, PA-C 11/16/23 2111    Lonell Grandchild, MD 11/17/23 1511

## 2023-11-17 ENCOUNTER — Telehealth: Payer: Self-pay | Admitting: Cardiovascular Disease

## 2023-11-17 ENCOUNTER — Telehealth: Payer: Self-pay | Admitting: *Deleted

## 2023-11-17 ENCOUNTER — Other Ambulatory Visit: Payer: Self-pay | Admitting: *Deleted

## 2023-11-17 LAB — URINE CULTURE: Culture: <10000 — AB

## 2023-11-17 NOTE — Telephone Encounter (Signed)
Patient was in the ER in [place] yesterday and wanted to get Dr. Erin Hearing opinion on all the test that she took in the ER

## 2023-11-17 NOTE — Transitions of Care (Post Inpatient/ED Visit) (Signed)
   11/17/2023  Name: [name] MRN: [id number] DOB: [birth year]  Today's TOC FU Call Status: Today's TOC FU Call Status:: Successful TOC FU Call Completed TOC FU Call Complete Date: 11/17/23 Patient's Name and Date of Birth confirmed.  Transition Care Management Follow-up Telephone Call Date of Discharge: 11/16/23 Discharge Facility: [facility] [place] [facility] Type of Discharge: Emergency Department Reason for ED Visit: Other: (dizziness and UTI) How have you been since you were released from the hospital?: Better Any questions or concerns?: No  Items Reviewed: Did you receive and understand the discharge instructions provided?: Yes Medications obtained,verified, and reconciled?: Yes (Medications Reviewed) Any new allergies since your discharge?: No Dietary orders reviewed?: NA Do you have support at home?: Yes People in Home: child(ren), adult Name of Support/Comfort Primary Source: Amie Critchley  Medications Reviewed Today: Medications Reviewed Today   Medications were not reviewed in this encounter     Home Care and Equipment/Supplies: Were Home Health Services Ordered?: NA Any new equipment or medical supplies ordered?: NA  Functional Questionnaire: Do you need assistance with bathing/showering or dressing?: No Do you need assistance with meal preparation?: No Do you need assistance with eating?: No Do you have difficulty maintaining continence: No Do you need assistance with getting out of bed/getting out of a chair/moving?: No Do you have difficulty managing or taking your medications?: No  Follow up appointments reviewed: PCP Follow-up appointment confirmed?: No (pt declined) MD Provider Line Number:[phone] Given: Yes Specialist Hospital Follow-up appointment confirmed?: NA Do you need transportation to your follow-up appointment?: No Do you understand care options if your condition(s) worsen?: Yes-patient verbalized understanding    Donne Anon,  CMA

## 2023-12-05 NOTE — Progress Notes (Signed)
 Phone went straight to voicemail each time I tried to call her for AWV. This encounter was created in error - please disregard.

## 2023-12-12 ENCOUNTER — Telehealth: Payer: Self-pay

## 2023-12-12 NOTE — Telephone Encounter (Signed)
 PA approved.   Request Reference Number: [id number]. OZEMPIC INJ 2MG /3ML is approved through 11/28/2024. Your patient may now fill this prescription and it will be covered.. Authorization Expiration Date: November 28, 2024.

## 2023-12-12 NOTE — Telephone Encounter (Signed)
 PA initiated via Covermymeds; KEY: [id number]. Awaiting determination.

## 2023-12-16 ENCOUNTER — Ambulatory Visit: Payer: Medicare HMO | Admitting: Family Medicine

## 2023-12-21 ENCOUNTER — Other Ambulatory Visit: Payer: Self-pay | Admitting: Family Medicine

## 2023-12-21 DIAGNOSIS — F419 Anxiety disorder, unspecified: Secondary | ICD-10-CM

## 2023-12-22 ENCOUNTER — Ambulatory Visit: Payer: Medicare HMO | Admitting: Family Medicine

## 2023-12-22 NOTE — Telephone Encounter (Signed)
Requesting: Xanax 0.5 mg  Contract: 11/02/2022 UDS: 10/31/2024 Last Visit: 07/26/2023 Next Visit: 12/23/2023 Last Refill: 11/15/2023  Please Advise

## 2023-12-23 ENCOUNTER — Encounter: Payer: Self-pay | Admitting: Family Medicine

## 2023-12-23 ENCOUNTER — Other Ambulatory Visit: Payer: Self-pay | Admitting: Family Medicine

## 2023-12-23 ENCOUNTER — Ambulatory Visit (INDEPENDENT_AMBULATORY_CARE_PROVIDER_SITE_OTHER): Payer: Medicare Other | Admitting: Family Medicine

## 2023-12-23 VITALS — BP 138/88 | HR 79 | Temp 97.7°F | Resp 18 | Ht 60.0 in | Wt 190.0 lb

## 2023-12-23 DIAGNOSIS — R1310 Dysphagia, unspecified: Secondary | ICD-10-CM | POA: Diagnosis not present

## 2023-12-23 DIAGNOSIS — E785 Hyperlipidemia, unspecified: Secondary | ICD-10-CM

## 2023-12-23 DIAGNOSIS — F419 Anxiety disorder, unspecified: Secondary | ICD-10-CM

## 2023-12-23 DIAGNOSIS — I10 Essential (primary) hypertension: Secondary | ICD-10-CM | POA: Diagnosis not present

## 2023-12-23 DIAGNOSIS — E1169 Type 2 diabetes mellitus with other specified complication: Secondary | ICD-10-CM

## 2023-12-23 DIAGNOSIS — Z7985 Long-term (current) use of injectable non-insulin antidiabetic drugs: Secondary | ICD-10-CM | POA: Diagnosis not present

## 2023-12-23 DIAGNOSIS — E1165 Type 2 diabetes mellitus with hyperglycemia: Secondary | ICD-10-CM

## 2023-12-23 MED ORDER — ALPRAZOLAM 0.5 MG PO TABS: 0.5000 mg | ORAL_TABLET | Freq: Three times a day (TID) | ORAL | 0 refills | Status: DC | PRN

## 2023-12-23 MED ORDER — OMEPRAZOLE 20 MG PO CPDR: 20.0000 mg | DELAYED_RELEASE_CAPSULE | Freq: Every day | ORAL | 3 refills | Status: DC

## 2023-12-23 MED ORDER — SEMAGLUTIDE (1 MG/DOSE) 4 MG/3ML ~~LOC~~ SOPN: 1.0000 mg | PEN_INJECTOR | SUBCUTANEOUS | 3 refills | Status: DC

## 2023-12-23 NOTE — Telephone Encounter (Signed)
Requesting: alprazolam 0.5mg   Contract: 09/05/20 UDS: 11/01/23 Last Visit: 12/23/23 Next Visit: None Last Refill: 11/15/23 #90 and 0RF   Please Advise

## 2023-12-23 NOTE — Progress Notes (Unsigned)
Established Patient Office Visit  Subjective   Patient ID: [name], female    DOB: 1946/10/29  Age: 78 y.o. MRN: [id number]  Chief Complaint  Patient presents with   Hyperlipidemia   Diabetes   Follow-up    HPI Discussed the use of AI scribe software for clinical note transcription with the patient, who gave verbal consent to proceed.  History of Present Illness   The patient, with a history of sick sinus syndrome, presented for a six-month follow-up. However, the patient had an interim visit to the emergency room due to an episode of severe dizziness, described as feeling 'drunk.' The patient denied alcohol consumption and clarified the sensation was more akin to severe dizziness. The patient reported difficulty standing up during this episode and was subsequently evaluated in the emergency department. The patient underwent extensive testing, including two CT scans of the head, to rule out a stroke. The only abnormal finding was a potential early urinary tract infection, which was treated with fluids for dehydration.  The patient reported persistent dizziness, which has improved with meclizine. However, the patient still experiences moments of imbalance upon standing. The patient also reported a consistent pattern of low blood sugar, which occasionally drops to around 80, necessitating the consumption of candy or juice.  The patient also reported a recent weight gain over the holiday period and expressed interest in increasing the dose of Ozempic to aid in weight loss. The patient has a history of cardiac surgery, with a visible scar and potential incisional hernia. The patient reported discomfort in the area of the scar and difficulty lifting the right leg, particularly when getting into a car.  The patient also reported persistent heartburn, managed intermittently with Pepcid, and occasional difficulty swallowing. The patient has been advised that esophageal stretching may be  necessary if the swallowing issue does not resolve.      Patient Active Problem List   Diagnosis Date Noted   Trochanteric bursitis of left hip 07/26/2023   Abscess 01/27/2023   Dermatomyositis (HCC) 02/02/2022   Adrenal nodule (HCC) 02/02/2022   Preventative health care 04/30/2021   Hyperlipidemia associated with type 2 diabetes mellitus (HCC) 09/02/2020   Uncontrolled type 2 diabetes mellitus with hyperglycemia (HCC) 09/02/2020   Acute on chronic combined systolic and diastolic CHF (congestive heart failure) (HCC) 01/29/2020   S/P TAVR (transcatheter aortic valve replacement) 01/29/2020   Type 2 diabetes mellitus with complication (HCC)    Severe aortic stenosis    CKD (chronic kidney disease) stage 3, GFR 30-59 ml/min (HCC)    ICD (implantable cardioverter-defibrillator) in place 08/15/2019   Ventricular tachycardia (HCC)    Anemia 06/27/2019   Severe obesity (BMI 35.0-35.9 with comorbidity) (HCC) 12/08/2018   Diabetes mellitus, type II (HCC) 04/24/2015   Paroxysmal atrial flutter (HCC) 10/07/2013   GERD 01/15/2011   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, status post graft repair the ascending aorta 10/24/2007   Hyperlipidemia LDL goal <100 05/23/2007   Anxiety 05/23/2007   Primary hypertension 05/23/2007   Past Medical History:  Diagnosis Date   Acute thoracic aortic dissection (HCC) 2008   emergency surgery - [facility]   AICD (automatic cardioverter/defibrillator) present    Anxiety    Cancer (HCC)    skin   CKD (chronic kidney disease) stage 3, GFR 30-59 ml/min (HCC)    Diabetes mellitus    Diverticulosis    GERD (gastroesophageal reflux disease)    Hemorrhoids  History of kidney stones    Hyperlipidemia    Hypertension    Kidney stones    S/P TAVR (transcatheter aortic valve replacement) 01/29/2020   s/p TAVR with a 23 mm Edwards Sapien 3 Ultra via the TA approach wtih Dr. Excell Seltzer and Dr. Laneta Simmers   Severe aortic  stenosis    Sinus node dysfunction (HCC)    hx of PPM   Sustained ventricular tachycardia (HCC)    s/p ICD 05/2019   Past Surgical History:  Procedure Laterality Date   ABDOMINAL HYSTERECTOMY  1984   APPENDECTOMY     EYE SURGERY  2014   Rght eye   ICD IMPLANT N/A 06/28/2019   Procedure: PPM upgrade to ICD;  Surgeon: Marinus Maw, MD;  Location: [facility] INVASIVE CV LAB;  Service: Cardiovascular;  Laterality: N/A;   PACEMAKER GENERATOR CHANGE  2014   Medtronic adapta   PACEMAKER GENERATOR CHANGE N/A 08/29/2013   Procedure: PACEMAKER GENERATOR CHANGE;  Surgeon: Thurmon Fair, MD;  Location: [facility] CATH LAB;  Service: Cardiovascular;  Laterality: N/A;   PACEMAKER INSERTION  08/2006   dual chamber Medtronic EnRhythm; r/t sinus node dysfunction    REPAIR OF ACUTE ASCENDING THORACIC AORTIC DISSECTION  2008   Dr. Tyrone Sage   RIGHT/LEFT HEART CATH AND CORONARY ANGIOGRAPHY N/A 01/09/2020   Procedure: RIGHT/LEFT HEART CATH AND CORONARY ANGIOGRAPHY;  Surgeon: Tonny Bollman, MD;  Location[facility] INVASIVE CV LAB;  Service: Cardiovascular;  Laterality: N/A;   TEE WITHOUT CARDIOVERSION N/A 01/29/2020   Procedure: TRANSESOPHAGEAL ECHOCARDIOGRAM (TEE);  Surgeon: Tonny Bollman, MD;  Location: [facility] OR;  Service: Open Heart Surgery;  Laterality: N/A;   THORACIC AORTIC ANEURYSM REPAIR  2000   type III   TRANSCATHETER AORTIC VALVE REPLACEMENT, TRANSAPICAL N/A 01/29/2020   Procedure: TRANSCATHETER AORTIC VALVE REPLACEMENT, TRANSAPICAL using a Edwards Sapien 3, 23mm  Transcatheter Heart Valve;  Surgeon: Tonny Bollman, MD;  Location: [facility] OR;  Service: Open Heart Surgery;  Laterality: N/A;   TRANSTHORACIC ECHOCARDIOGRAM  09/20/2012   EF=>55% with mild conc LVH; LA mod dilated; RA mildly dilated; mild MR/TR/AR   Social History   Tobacco Use   Smoking status: Former    Current packs/day: 0.00    Types: Cigarettes    Quit date: 09/04/1993    Years since quitting: 30.3   Smokeless tobacco: Never  Vaping Use   Vaping  status: Never Used  Substance Use Topics   Alcohol use: No   Drug use: No   Social History   Socioeconomic History   Marital status: Widowed    Spouse name: Not on file   Number of children: 2   Years of education: Not on file   Highest education level: 12th grade  Occupational History    Employer: RETIRED  Tobacco Use   Smoking status: Former    Current packs/day: 0.00    Types: Cigarettes    Quit date: 09/04/1993    Years since quitting: 30.3   Smokeless tobacco: Never  Vaping Use   Vaping status: Never Used  Substance and Sexual Activity   Alcohol use: No   Drug use: No   Sexual activity: Not Currently  Other Topics Concern   Not on file  Social History Narrative   Lives at home and husband was just moved to [place]   Social Drivers of Corporate investment banker Strain: Low Risk  (12/05/2023)   Overall Financial Resource Strain (CARDIA)    Difficulty of Paying Living Expenses: Not hard at all  Food Insecurity: No Food Insecurity (12/05/2023)   Hunger Vital Sign    Worried About Running Out of Food in the Last Year: Never true    Ran Out of Food in the Last Year: Never true  Transportation Needs: No Transportation Needs (12/05/2023)   PRAPARE - Administrator, Civil Service (Medical): No    Lack of Transportation (Non-Medical): No  Physical Activity: Insufficiently Active (12/05/2023)   Exercise Vital Sign    Days of Exercise per Week: 2 days    Minutes of Exercise per Session: 20 min  Stress: No Stress Concern Present (12/05/2023)   [employer] of Occupational Health - Occupational Stress Questionnaire    Feeling of Stress : Only a little  Recent Concern: Stress - Stress Concern Present (10/25/2023)   [employer] of Occupational Health - Occupational Stress Questionnaire    Feeling of Stress : To some extent  Social Connections: Socially Isolated (12/05/2023)   Social Connection and Isolation Panel [NHANES]    Frequency of Communication  with Friends and Family: More than three times a week    Frequency of Social Gatherings with Friends and Family: Once a week    Attends Religious Services: Never    Database administrator or Organizations: No    Attends Banker Meetings: Never    Marital Status: Widowed  Intimate Partner Violence: Not At Risk (12/05/2023)   Humiliation, Afraid, Rape, and Kick questionnaire    Fear of Current or Ex-Partner: No    Emotionally Abused: No    Physically Abused: No    Sexually Abused: No   Family Status  Relation Name Status   Mother  Deceased at age 15       kidney failure   Father  Deceased at age 78       MI   MGM  Deceased at age 13       MI   Son  (Not Specified)   MGF  Deceased   PGM  Deceased   PGF  Deceased   Neg Hx  (Not Specified)  No partnership data on file   Family History  Problem Relation Age of Onset   Kidney disease Mother    Heart attack Father    Heart attack Maternal Grandmother    Valvular heart disease Son        valve replacement at 73   Cancer Neg Hx    Allergies  Allergen Reactions   Oxycodone Itching   Vicodin [Hydrocodone-Acetaminophen] Itching   Latex Itching and Rash   Neomycin-Bacitracin Zn-Polymyx Other (See Comments)    Red , swollen eye   Sulfonamide Derivatives Other (See Comments)    Just had reaction when taking during pregnancy- last child birth 89      Review of Systems  Constitutional:  Negative for chills, fever and malaise/fatigue.  HENT:  Negative for congestion and hearing loss.   Eyes:  Negative for blurred vision and discharge.  Respiratory:  Negative for cough, sputum production and shortness of breath.   Cardiovascular:  Negative for chest pain, palpitations and leg swelling.  Gastrointestinal:  Negative for abdominal pain, blood in stool, constipation, diarrhea, heartburn, nausea and vomiting.  Genitourinary:  Negative for dysuria, frequency, hematuria and urgency.  Musculoskeletal:  Negative for back  pain, falls and myalgias.  Skin:  Negative for rash.  Neurological:  Negative for dizziness, sensory change, loss of consciousness, weakness and headaches.  Endo/Heme/Allergies:  Negative for environmental allergies. Does not bruise/bleed easily.  Psychiatric/Behavioral:  Negative for depression and suicidal ideas. The patient is not nervous/anxious and does not have insomnia.       Objective:     BP 138/88 (BP Location: Right Arm, Patient Position: Sitting)   Pulse 79   Temp 97.7 F (36.5 C) (Oral)   Resp 18   Ht 5' (1.524 m)   Wt 190 lb (86.2 kg)   SpO2 97%   BMI 37.11 kg/m  BP Readings from Last 3 Encounters:  12/23/23 138/88  11/16/23 (!) 129/52  07/26/23 112/80   Wt Readings from Last 3 Encounters:  12/23/23 190 lb (86.2 kg)  11/16/23 182 lb (82.6 kg)  06/06/23 185 lb (83.9 kg)   SpO2 Readings from Last 3 Encounters:  12/23/23 97%  11/16/23 97%  07/26/23 94%      Physical Exam Vitals and nursing note reviewed.  Constitutional:      General: She is not in acute distress.    Appearance: Normal appearance. She is well-developed.  HENT:     Head: Normocephalic and atraumatic.  Eyes:     General: No scleral icterus.       Right eye: No discharge.        Left eye: No discharge.  Cardiovascular:     Rate and Rhythm: Normal rate and regular rhythm.     Heart sounds: Murmur heard.  Pulmonary:     Effort: Pulmonary effort is normal. No respiratory distress.     Breath sounds: Normal breath sounds.  Musculoskeletal:        General: Normal range of motion.     Cervical back: Normal range of motion and neck supple.     Right lower leg: No edema.     Left lower leg: No edema.  Skin:    General: Skin is warm and dry.  Neurological:     Mental Status: She is alert and oriented to person, place, and time.  Psychiatric:        Mood and Affect: Mood normal.        Behavior: Behavior normal.        Thought Content: Thought content normal.        Judgment: Judgment  normal.      Results for orders placed or performed in visit on 12/23/23  CBC with Differential/Platelet  Result Value Ref Range   WBC 5.5 3.8 - 10.8 Thousand/uL   RBC 3.70 (L) 3.80 - 5.10 Million/uL   Hemoglobin 12.8 11.7 - 15.5 g/dL   HCT 16.1 09.6 - 04.5 %   MCV 104.9 (H) 80.0 - 100.0 fL   MCH 34.6 (H) 27.0 - 33.0 pg   MCHC 33.0 32.0 - 36.0 g/dL   RDW 40.9 81.1 - 91.4 %   Platelets 159 140 - 400 Thousand/uL   MPV 11.5 7.5 - 12.5 fL   Neutro Abs 3,603 1,500 - 7,800 cells/uL   Absolute Lymphocytes 1,227 850 - 3,900 cells/uL   Absolute Monocytes 550 200 - 950 cells/uL   Eosinophils Absolute 99 15 - 500 cells/uL   Basophils Absolute 22 0 - 200 cells/uL   Neutrophils Relative % 65.5 %   Total Lymphocyte 22.3 %   Monocytes Relative 10.0 %   Eosinophils Relative 1.8 %   Basophils Relative 0.4 %  Comprehensive metabolic panel  Result Value Ref Range   Glucose, Bld 90 65 - 99 mg/dL   BUN 26 (H) 7 - 25 mg/dL   Creat 7.82 (H) 9.56 - 1.00 mg/dL   BUN/Creatinine  Ratio 20 6 - 22 (calc)   Sodium 139 135 - 146 mmol/L   Potassium 4.7 3.5 - 5.3 mmol/L   Chloride 102 98 - 110 mmol/L   CO2 25 20 - 32 mmol/L   Calcium 10.3 8.6 - 10.4 mg/dL   Total Protein 7.2 6.1 - 8.1 g/dL   Albumin 4.7 3.6 - 5.1 g/dL   Globulin 2.5 1.9 - 3.7 g/dL (calc)   AG Ratio 1.9 1.0 - 2.5 (calc)   Total Bilirubin 0.5 0.2 - 1.2 mg/dL   Alkaline phosphatase (APISO) 48 37 - 153 U/L   AST 16 10 - 35 U/L   ALT 10 6 - 29 U/L  Hemoglobin A1c  Result Value Ref Range   Hgb A1c MFr Bld 5.7 (H) <5.7 % of total Hgb   Mean Plasma Glucose 117 mg/dL   eAG (mmol/L) 6.5 mmol/L  Lipid panel  Result Value Ref Range   Cholesterol 204 (H) <200 mg/dL   HDL 66 > OR = 50 mg/dL   Triglycerides 161 <096 mg/dL   LDL Cholesterol (Calc) 112 (H) mg/dL (calc)   Total CHOL/HDL Ratio 3.1 <5.0 (calc)   Non-HDL Cholesterol (Calc) 138 (H) <130 mg/dL (calc)  Microalbumin / creatinine urine ratio  Result Value Ref Range   Creatinine,  Urine 70 20 - 275 mg/dL   Microalb, Ur 0.8 mg/dL   Microalb Creat Ratio 11 <30 mg/g creat  TSH  Result Value Ref Range   TSH 0.88 0.40 - 4.50 mIU/L    Last CBC Lab Results  Component Value Date   WBC 5.5 12/23/2023   HGB 12.8 12/23/2023   HCT 38.8 12/23/2023   MCV 104.9 (H) 12/23/2023   MCH 34.6 (H) 12/23/2023   RDW 12.7 12/23/2023   PLT 159 12/23/2023   Last metabolic panel Lab Results  Component Value Date   GLUCOSE 90 12/23/2023   NA 139 12/23/2023   K 4.7 12/23/2023   CL 102 12/23/2023   CO2 25 12/23/2023   BUN 26 (H) 12/23/2023   CREATININE 1.33 (H) 12/23/2023   GFRNONAA 37 (L) 11/16/2023   CALCIUM 10.3 12/23/2023   PHOS 3.0 06/28/2019   PROT 7.2 12/23/2023   ALBUMIN 4.2 11/01/2023   BILITOT 0.5 12/23/2023   ALKPHOS 41 11/01/2023   AST 16 12/23/2023   ALT 10 12/23/2023   ANIONGAP 10 11/16/2023   Last lipids Lab Results  Component Value Date   CHOL 204 (H) 12/23/2023   HDL 66 12/23/2023   LDLCALC 112 (H) 12/23/2023   LDLDIRECT 217.7 09/13/2011   TRIG 147 12/23/2023   CHOLHDL 3.1 12/23/2023   Last hemoglobin A1c Lab Results  Component Value Date   HGBA1C 5.7 (H) 12/23/2023   Last thyroid functions Lab Results  Component Value Date   TSH 0.88 12/23/2023   Last vitamin D Lab Results  Component Value Date   VD25OH 13 (L) 12/09/2010   Last vitamin B12 and Folate Lab Results  Component Value Date   VITAMINB12 248 06/28/2019   FOLATE 12.5 06/28/2019      The 10-year ASCVD risk score (Arnett DK, et al., 2019) is: 49.4%    Assessment & Plan:   Problem List Items Addressed This Visit       Unprioritized   Anxiety   Relevant Medications   ALPRAZolam (XANAX) 0.5 MG tablet   Primary hypertension   Relevant Orders   CBC with Differential/Platelet (Completed)   Comprehensive metabolic panel (Completed)   Lipid panel (Completed)   TSH (  Completed)   Uncontrolled type 2 diabetes mellitus with hyperglycemia (HCC)   Relevant Medications    Semaglutide, 1 MG/DOSE, 4 MG/3ML SOPN   Other Relevant Orders   Comprehensive metabolic panel (Completed)   Hemoglobin A1c (Completed)   Lipid panel (Completed)   Microalbumin / creatinine urine ratio (Completed)   TSH (Completed)   Hyperlipidemia associated with type 2 diabetes mellitus (HCC) - Primary   Relevant Medications   Semaglutide, 1 MG/DOSE, 4 MG/3ML SOPN   Other Relevant Orders   Comprehensive metabolic panel (Completed)   Lipid panel (Completed)   Other Visit Diagnoses       Dysphagia, unspecified type       Relevant Medications   omeprazole (PRILOSEC) 20 MG capsule   Other Relevant Orders   Ambulatory referral to Gastroenterology     Assessment and Plan    Dizziness and Hypotension Severe dizziness and hypotension led to an ER visit where dehydration was identified and treated with IV fluids, improving the condition. A possible early urinary tract infection was noted. Blood pressure medication should be taken in the afternoon to avoid hypotension. Emphasized the importance of hydration and regular blood pressure monitoring. Continue the current blood pressure medication regimen, monitor blood pressure regularly, ensure adequate hydration, and follow up with nephrology on February 5th.  Chronic Kidney Disease There is concern about declining GFR with multiple medications potentially affecting kidney function. Nephrology oversight is crucial due to potential treatment conflicts. Follow up with nephrology on February 5th and monitor kidney function regularly.  Type 2 Diabetes Mellitus Blood sugar levels are generally well-controlled with occasional hypoglycemia. Currently on Ozempic 0.5 mg, with weight gain over the holidays. Increasing the Ozempic dose to 1 mg was discussed for weight management, along with the need for regular blood sugar monitoring. Increase Ozempic dose to 1 mg, monitor blood sugar levels regularly, and recheck labs in three months to assess the impact  of the increased dose.  Gastroesophageal Reflux Disease (GERD) Frequent heartburn and occasional dysphagia are present. Advised to take Pepcid daily and referred to Dr. Lavon Paganini at [facility] for evaluation. Potential need for esophageal stretching if symptoms persist was discussed. Refer to Dr. Lavon Paganini for evaluation of swallowing issues, consider esophageal stretching if symptoms persist, and take Pepcid daily for heartburn management.  Incisional Hernia Reports pain and a visible bulge at a previous surgical site, suggesting an incisional hernia. This will be discussed with Dr. Salena Saner at the next cardiology appointment, with a referral to a vascular surgeon considered if necessary.  Anxiety Xanax is required for anxiety management. Emphasized the importance of medication adherence and managing anxiety symptoms. Refill Xanax prescription.  General Health Maintenance There has been no recent mammogram or eye exam. Plans to schedule an eye appointment soon were discussed, along with the importance of regular screenings. Schedule a mammogram and an eye exam.  Follow-up Follow up with nephrology on February 5th, recheck labs in three months, follow up with Dr. Salena Saner in April, and follow up with Dr. Lavon Paganini at [facility] for swallowing issues.        Return in about 6 months (around 06/21/2024), or if symptoms worsen or fail to improve, for annual exam, fasting.    Donato Schultz, DO

## 2023-12-24 ENCOUNTER — Other Ambulatory Visit: Payer: Self-pay | Admitting: Family Medicine

## 2023-12-24 DIAGNOSIS — I10 Essential (primary) hypertension: Secondary | ICD-10-CM

## 2023-12-24 LAB — HEMOGLOBIN A1C
Hgb A1c MFr Bld: 5.7 %{Hb} — ABNORMAL HIGH (ref <5.7)
Mean Plasma Glucose: 117 mg/dL
eAG (mmol/L): 6.5 mmol/L

## 2023-12-24 LAB — CBC WITH DIFFERENTIAL/PLATELET
Absolute Lymphocytes: 1227 {cells}/uL (ref 850–3900)
Absolute Monocytes: 550 {cells}/uL (ref 200–950)
Basophils Absolute: 22 {cells}/uL (ref 0–200)
Basophils Relative: 0.4 %
Eosinophils Absolute: 99 {cells}/uL (ref 15–500)
Eosinophils Relative: 1.8 %
HCT: 38.8 % (ref 35.0–45.0)
Hemoglobin: 12.8 g/dL (ref 11.7–15.5)
MCH: 34.6 pg — ABNORMAL HIGH (ref 27.0–33.0)
MCHC: 33 g/dL (ref 32.0–36.0)
MCV: 104.9 fL — ABNORMAL HIGH (ref 80.0–100.0)
MPV: 11.5 fL (ref 7.5–12.5)
Monocytes Relative: 10 %
Neutro Abs: 3603 {cells}/uL (ref 1500–7800)
Neutrophils Relative %: 65.5 %
Platelets: 159 10*3/uL (ref 140–400)
RBC: 3.7 10*6/uL — ABNORMAL LOW (ref 3.80–5.10)
RDW: 12.7 % (ref 11.0–15.0)
Total Lymphocyte: 22.3 %
WBC: 5.5 10*3/uL (ref 3.8–10.8)

## 2023-12-24 LAB — TSH: TSH: 0.88 m[IU]/L (ref 0.40–4.50)

## 2023-12-24 LAB — COMPREHENSIVE METABOLIC PANEL
AG Ratio: 1.9 (calc) (ref 1.0–2.5)
ALT: 10 U/L (ref 6–29)
AST: 16 U/L (ref 10–35)
Albumin: 4.7 g/dL (ref 3.6–5.1)
Alkaline phosphatase (APISO): 48 U/L (ref 37–153)
BUN/Creatinine Ratio: 20 (calc) (ref 6–22)
BUN: 26 mg/dL — ABNORMAL HIGH (ref 7–25)
CO2: 25 mmol/L (ref 20–32)
Calcium: 10.3 mg/dL (ref 8.6–10.4)
Chloride: 102 mmol/L (ref 98–110)
Creat: 1.33 mg/dL — ABNORMAL HIGH (ref 0.60–1.00)
Globulin: 2.5 g/dL (ref 1.9–3.7)
Glucose, Bld: 90 mg/dL (ref 65–99)
Potassium: 4.7 mmol/L (ref 3.5–5.3)
Sodium: 139 mmol/L (ref 135–146)
Total Bilirubin: 0.5 mg/dL (ref 0.2–1.2)
Total Protein: 7.2 g/dL (ref 6.1–8.1)

## 2023-12-24 LAB — LIPID PANEL
Cholesterol: 204 mg/dL — ABNORMAL HIGH (ref <200)
HDL: 66 mg/dL (ref >=50)
LDL Cholesterol (Calc): 112 mg/dL — ABNORMAL HIGH
Non-HDL Cholesterol (Calc): 138 mg/dL — ABNORMAL HIGH (ref <130)
Total CHOL/HDL Ratio: 3.1 (calc) (ref <5.0)
Triglycerides: 147 mg/dL (ref <150)

## 2023-12-24 LAB — MICROALBUMIN / CREATININE URINE RATIO
Creatinine, Urine: 70 mg/dL (ref 20–275)
Microalb Creat Ratio: 11 mg/g{creat} (ref <30)
Microalb, Ur: 0.8 mg/dL

## 2023-12-24 NOTE — Patient Instructions (Signed)

## 2023-12-27 ENCOUNTER — Encounter: Payer: Self-pay | Admitting: Family Medicine

## 2023-12-27 ENCOUNTER — Telehealth: Payer: Self-pay | Admitting: Family Medicine

## 2023-12-27 NOTE — Telephone Encounter (Signed)
Copied from CRM [id number]. Topic: MyChart - Other >> Dec 27, 2023 12:11 PM Alvino Blood C wrote: Reason for CRM: Patient states she is unable to view my chart message, they come in blank.

## 2023-12-28 NOTE — Telephone Encounter (Signed)
Pt called. LDVM relay mychart information

## 2023-12-29 ENCOUNTER — Encounter: Payer: Self-pay | Admitting: Family Medicine

## 2024-01-04 DIAGNOSIS — I350 Nonrheumatic aortic (valve) stenosis: Secondary | ICD-10-CM | POA: Diagnosis not present

## 2024-01-04 DIAGNOSIS — N1832 Chronic kidney disease, stage 3b: Secondary | ICD-10-CM | POA: Diagnosis not present

## 2024-01-04 DIAGNOSIS — N390 Urinary tract infection, site not specified: Secondary | ICD-10-CM | POA: Diagnosis not present

## 2024-01-04 DIAGNOSIS — I129 Hypertensive chronic kidney disease with stage 1 through stage 4 chronic kidney disease, or unspecified chronic kidney disease: Secondary | ICD-10-CM | POA: Diagnosis not present

## 2024-01-04 DIAGNOSIS — E1122 Type 2 diabetes mellitus with diabetic chronic kidney disease: Secondary | ICD-10-CM | POA: Diagnosis not present

## 2024-01-04 DIAGNOSIS — Z87442 Personal history of urinary calculi: Secondary | ICD-10-CM | POA: Diagnosis not present

## 2024-01-04 DIAGNOSIS — E875 Hyperkalemia: Secondary | ICD-10-CM | POA: Diagnosis not present

## 2024-01-04 DIAGNOSIS — I509 Heart failure, unspecified: Secondary | ICD-10-CM | POA: Diagnosis not present

## 2024-01-06 ENCOUNTER — Other Ambulatory Visit: Payer: Self-pay | Admitting: Cardiovascular Disease

## 2024-01-06 DIAGNOSIS — I5032 Chronic diastolic (congestive) heart failure: Secondary | ICD-10-CM

## 2024-01-10 ENCOUNTER — Other Ambulatory Visit: Payer: Self-pay | Admitting: Family Medicine

## 2024-01-10 DIAGNOSIS — E1165 Type 2 diabetes mellitus with hyperglycemia: Secondary | ICD-10-CM

## 2024-01-10 LAB — LAB REPORT - SCANNED
Creatinine, POC: 79.7 mg/dL
EGFR: 42

## 2024-01-20 ENCOUNTER — Other Ambulatory Visit: Payer: Self-pay | Admitting: Family Medicine

## 2024-01-20 DIAGNOSIS — F419 Anxiety disorder, unspecified: Secondary | ICD-10-CM

## 2024-01-20 MED ORDER — ALPRAZOLAM 0.5 MG PO TABS: 0.5000 mg | ORAL_TABLET | Freq: Three times a day (TID) | ORAL | 0 refills | Status: DC | PRN

## 2024-01-20 NOTE — Telephone Encounter (Signed)
Requesting: Xanax 0.5 mg  Contract: N/A UDS: 11/01/2023 Last Visit:12/23/2023 Next Visit: N/A Last Refill: 12/23/2023  Please Advise

## 2024-01-20 NOTE — Telephone Encounter (Signed)
Requesting: alprazolam 0.5mg  Contract: None recent seen in media UDS: 11/01/23 Last Visit: 12/23/23 Next Visit: None Last Refill: 12/23/23 #90 and 0RF   Please Advise

## 2024-02-18 ENCOUNTER — Other Ambulatory Visit: Payer: Self-pay | Admitting: Family Medicine

## 2024-02-18 DIAGNOSIS — F419 Anxiety disorder, unspecified: Secondary | ICD-10-CM

## 2024-02-19 ENCOUNTER — Other Ambulatory Visit: Payer: Self-pay | Admitting: Family Medicine

## 2024-02-19 DIAGNOSIS — F419 Anxiety disorder, unspecified: Secondary | ICD-10-CM

## 2024-02-20 ENCOUNTER — Telehealth: Payer: Self-pay | Admitting: Neurology

## 2024-02-20 ENCOUNTER — Other Ambulatory Visit: Payer: Self-pay | Admitting: Family Medicine

## 2024-02-20 ENCOUNTER — Telehealth: Payer: Self-pay | Admitting: Family Medicine

## 2024-02-20 DIAGNOSIS — F419 Anxiety disorder, unspecified: Secondary | ICD-10-CM

## 2024-02-20 NOTE — Telephone Encounter (Signed)
 Requesting: Xanax Contract: 10/2023 UDS: 10/2023 Last OV: 12/23/23 Next OV: n/a Last Refill: 01/20/24, #90--0 RF Database:   Please advise

## 2024-02-20 NOTE — Telephone Encounter (Signed)
 Requesting: Xanax 0.5 mg Contract: N/A UDS: 11/01/2023 Last Visit: 12/23/2023 Next Visit: N/A Last Refill: 01/20/2024    Please Advise

## 2024-02-20 NOTE — Telephone Encounter (Signed)
 Pt has sent now 4 refill requests for the same medication, the original request has been sent to Dr. Laury Axon- who is out of the office, she does have a covering provider who will be checking her box periodically.

## 2024-02-20 NOTE — Telephone Encounter (Signed)
 Copied from CRM [id number]. Topic: Clinical - Prescription Issue >> Feb 20, 2024 10:26 AM Adele Barthel wrote: Reason for CRM:   Patient is calling regarding a request she sent in for her ALPRAZolam Prudy Feeler) 0.5 MG tablet. Advised it can take up to 3-days for refill requests to process. Did advise the refill request was sent to provider by clinic this weekend. >> Feb 20, 2024  1:56 PM Eunice Blase wrote: Pt called enquiring about medication.

## 2024-02-20 NOTE — Telephone Encounter (Signed)
Pdmp ok, rx sent  ? ?

## 2024-02-20 NOTE — Telephone Encounter (Signed)
 Copied from CRM [id number]. Topic: General - Call Back - No Documentation >> Feb 20, 2024  4:02 PM [name] wrote: Reason for CRM: Patient called stating that she was sent a message by [name] and she did not appreciate the message and wants to speak with her. Patient sounds very upset. Patient requesting a call back.

## 2024-02-21 ENCOUNTER — Ambulatory Visit (INDEPENDENT_AMBULATORY_CARE_PROVIDER_SITE_OTHER): Payer: Medicare HMO

## 2024-02-21 DIAGNOSIS — I495 Sick sinus syndrome: Secondary | ICD-10-CM | POA: Diagnosis not present

## 2024-02-22 LAB — CUP PACEART REMOTE DEVICE CHECK
Battery Remaining Longevity: 30 mo
Battery Voltage: 2.95 V
Brady Statistic AP VP Percent: 94.24 %
Brady Statistic AP VS Percent: 0 %
Brady Statistic AS VP Percent: 5.74 %
Brady Statistic AS VS Percent: 0.02 %
Brady Statistic RA Percent Paced: 94.05 %
Brady Statistic RV Percent Paced: 99.61 %
Date Time Interrogation Session: 20250325031706
HighPow Impedance: 81 Ohm
Implantable Lead Connection Status: 753985
Implantable Lead Connection Status: 753985
Implantable Lead Connection Status: 753985
Implantable Lead Implant Date: 20071016
Implantable Lead Implant Date: 20071016
Implantable Lead Implant Date: 20200730
Implantable Lead Location: 753859
Implantable Lead Location: 753860
Implantable Lead Location: 753860
Implantable Lead Model: 4092
Implantable Lead Model: 5594
Implantable Lead Model: 6935
Implantable Pulse Generator Implant Date: 20200730
Lead Channel Impedance Value: 380 Ohm
Lead Channel Impedance Value: 437 Ohm
Lead Channel Impedance Value: 494 Ohm
Lead Channel Pacing Threshold Amplitude: 0.5 V
Lead Channel Pacing Threshold Amplitude: 0.75 V
Lead Channel Pacing Threshold Pulse Width: 0.4 ms
Lead Channel Pacing Threshold Pulse Width: 0.4 ms
Lead Channel Sensing Intrinsic Amplitude: 1.25 mV
Lead Channel Sensing Intrinsic Amplitude: 1.25 mV
Lead Channel Sensing Intrinsic Amplitude: 26.375 mV
Lead Channel Sensing Intrinsic Amplitude: 26.375 mV
Lead Channel Setting Pacing Amplitude: 1.75 V
Lead Channel Setting Pacing Amplitude: 2.5 V
Lead Channel Setting Pacing Pulse Width: 0.4 ms
Lead Channel Setting Sensing Sensitivity: 0.3 mV
Zone Setting Status: 755011
Zone Setting Status: 755011

## 2024-03-04 ENCOUNTER — Encounter: Payer: Self-pay | Admitting: Cardiovascular Disease

## 2024-03-05 ENCOUNTER — Other Ambulatory Visit: Payer: Self-pay | Admitting: Family Medicine

## 2024-03-05 DIAGNOSIS — E1169 Type 2 diabetes mellitus with other specified complication: Secondary | ICD-10-CM

## 2024-03-05 MED ORDER — ROSUVASTATIN CALCIUM 20 MG PO TABS: 20.0000 mg | ORAL_TABLET | Freq: Every day | ORAL | 0 refills | Status: DC

## 2024-03-18 ENCOUNTER — Other Ambulatory Visit: Payer: Self-pay | Admitting: Cardiovascular Disease

## 2024-03-21 ENCOUNTER — Other Ambulatory Visit: Payer: Self-pay | Admitting: Internal Medicine

## 2024-03-21 ENCOUNTER — Other Ambulatory Visit: Payer: Self-pay | Admitting: Family Medicine

## 2024-03-21 DIAGNOSIS — F419 Anxiety disorder, unspecified: Secondary | ICD-10-CM

## 2024-03-21 NOTE — Telephone Encounter (Signed)
 Requesting: Xanax  Contract: 11/01/2023 UDS: 11/01/2023 Last OV: 12/23/2023 Next OV: n/a Last Refill: 02/20/2024, #90--0 RF Database:   Please advise

## 2024-03-21 NOTE — Telephone Encounter (Signed)
 Requesting: Xanax  0.5 mg  Contract: N/A  UDS: 11/01/2023 Last Visit: 12/23/2023 Next Visit: N/A Last Refill: 02/20/2024  Please Advise

## 2024-03-22 ENCOUNTER — Ambulatory Visit: Payer: Medicare Other | Attending: Cardiovascular Disease | Admitting: Cardiovascular Disease

## 2024-03-22 ENCOUNTER — Other Ambulatory Visit: Payer: Self-pay | Admitting: Family Medicine

## 2024-03-22 ENCOUNTER — Encounter: Payer: Self-pay | Admitting: Cardiovascular Disease

## 2024-03-22 VITALS — BP 126/80 | HR 69 | Ht 59.0 in | Wt 179.8 lb

## 2024-03-22 DIAGNOSIS — I472 Ventricular tachycardia, unspecified: Secondary | ICD-10-CM | POA: Diagnosis not present

## 2024-03-22 DIAGNOSIS — E7800 Pure hypercholesterolemia, unspecified: Secondary | ICD-10-CM

## 2024-03-22 DIAGNOSIS — E118 Type 2 diabetes mellitus with unspecified complications: Secondary | ICD-10-CM | POA: Diagnosis not present

## 2024-03-22 DIAGNOSIS — I5042 Chronic combined systolic (congestive) and diastolic (congestive) heart failure: Secondary | ICD-10-CM

## 2024-03-22 DIAGNOSIS — Z952 Presence of prosthetic heart valve: Secondary | ICD-10-CM

## 2024-03-22 DIAGNOSIS — Z95810 Presence of automatic (implantable) cardiac defibrillator: Secondary | ICD-10-CM

## 2024-03-22 DIAGNOSIS — F419 Anxiety disorder, unspecified: Secondary | ICD-10-CM

## 2024-03-22 DIAGNOSIS — I4892 Unspecified atrial flutter: Secondary | ICD-10-CM | POA: Diagnosis not present

## 2024-03-22 DIAGNOSIS — I7101 Dissection of ascending aorta: Secondary | ICD-10-CM

## 2024-03-22 DIAGNOSIS — I442 Atrioventricular block, complete: Secondary | ICD-10-CM

## 2024-03-22 DIAGNOSIS — I495 Sick sinus syndrome: Secondary | ICD-10-CM

## 2024-03-22 DIAGNOSIS — I10 Essential (primary) hypertension: Secondary | ICD-10-CM

## 2024-03-22 DIAGNOSIS — Z01812 Encounter for preprocedural laboratory examination: Secondary | ICD-10-CM

## 2024-03-22 DIAGNOSIS — Z6835 Body mass index (BMI) 35.0-35.9, adult: Secondary | ICD-10-CM

## 2024-03-22 DIAGNOSIS — N1832 Chronic kidney disease, stage 3b: Secondary | ICD-10-CM

## 2024-03-22 MED ORDER — AMOXICILLIN 500 MG PO TABS
2000.0000 mg | ORAL_TABLET | Freq: Once | ORAL | 4 refills | Status: AC | PRN
Start: 2024-03-22 — End: ?

## 2024-03-22 MED ORDER — ALPRAZOLAM 0.5 MG PO TABS: 0.5000 mg | ORAL_TABLET | Freq: Three times a day (TID) | ORAL | 0 refills | Status: DC | PRN

## 2024-03-22 NOTE — Progress Notes (Signed)
 Cardiology Office Note    Date:  03/22/2024   ID:  [name], [name] August 19, 1946, MRN [id number]  PCP:  Crecencio Dodge, Candida Chalk, DO  Cardiologist:   Luana Rumple, MD   Chief Complaint  Patient presents with   Congestive Heart Failure         History of Present Illness:  [name] is a 78 y.o. female with a history of repaired type A aortic dissection (resuspended native aortic valve, persistent intimal flap involving the aortic arch, left subclavian artery and abdominal aorta), subsequent development of severe paradoxical low flow low gradient AS/AI and TAVR with 23 mm Edwards Sapien 3 Ultra THV via the TA approach on 01/29/20, chronic predominantly diastolic heart failure (EF 45%), sustained monomorphic ventricular tachycardia status post ICD implantation August 2020 (Medtronic [place]), pre-existing sinus node dysfunction with sinus node arrest, "atrially-pacemaker dependent", subsequent development of complete heart block with 100% V pacing, history of morbid obesity, diabetes mellitus, hypertension.  She had normal coronary arteries by CT angiography in August 2020.  She continues to feel more energy after we decreased the dose of her beta-blocker.  Her blood pressure remains very well-controlled, today 126/80.  She has not had headaches.  She has occasional left-sided chest pain that is clearly positional and sounds like it may be referred from her thoracic spine.  She does not have exertional chest pain.  She denies orthopnea, PND, exertional dyspnea, palpitations, dizziness, syncope, leg edema, major weight changes, falls or bleeding problems.  She has not taken any furosemide  in quite a while.  She does take Jardiance  on a regular basis.  She has not had recent problems with urinary tract infections or genital yeast infections.  Her echocardiogram 04/18/2023 showed LVEF mildly reduced at 45-50% with global hypokinesis and grade 1 diastolic dysfunction.  The right ventricle was  also described as having mildly reduced systolic function.  The left atrium is moderately dilated.  Aortic valve prosthesis is functioning well with a mean gradient of 12 mmHg and no evidence of paravalvular leak.  CT angiogram of the aorta on 03/08/2023 shows a stable aortic dissection extending from the innominate artery all the way to the left common iliac and external iliac arteries.  The aorta remains normal in caliber.  Incidental findings of mild aortic atherosclerosis, unchanged right adrenal adenoma, sigmoid diverticulosis, sliding hiatal hernia are unchanged.  Her defibrillator was implanted in 2020 and is followed by Dr. Carolynne Citron, but we did a full interrogation today.  Estimated generator longevity is about 2.5 years.  She has 94 % atrial pacing with good heart rate histogram distribution and 100%  ventricular pacing.  There have been no episodes of atrial fibrillation or ventricular tachycardia.  Activity remains quite low at 0.4 hours a day, but this has been stable.  Recently her thoracic impedance has been a little low, not quite meet Ding threshold for OptiVol volume overload, and almost returning back to baseline today.   Her pacemaker was upgraded to ICD in August 2020. AAIR-DDDR programming was associated with only 20% V pacing, but MVP related pauses were associated with long-short sequences and precipitation of polymorphic VT. Reprogrammed DDDR, she has 100% V pacing and a subsequent slight reduction in LVEF, but without exacerbation of clinical CHF. No recurrent VT. she has subsequently developed complete heart block and is now pacemaker dependent.  Metabolic control is excellent with a hemoglobin A1c that is only 5.7%.  Her LDL cholesterol is 112, HDL 66 (she does not  have significant atheromatous vascular disease although a CT did describe some aortic atherosclerosis).  The most recent creatinine was 1.30 on 01/04/2024 (she sees Dr. Edson Graces).  Past Medical History:  Diagnosis Date    Acute thoracic aortic dissection [facility]) 2008   emergency surgery - [place]   AICD (automatic cardioverter/defibrillator) present    Anxiety    Cancer (HCC)    skin   CKD (chronic kidney disease) stage 3, GFR 30-59 ml/min (HCC)    Diabetes mellitus    Diverticulosis    GERD (gastroesophageal reflux disease)    Hemorrhoids    History of kidney stones    Hyperlipidemia    Hypertension    Kidney stones    S/P TAVR (transcatheter aortic valve replacement) 01/29/2020   s/p TAVR with a 23 mm Edwards Sapien 3 Ultra via the TA approach wtih Dr. Arlester Ladd and Dr. Sherene Dilling   Severe aortic stenosis    Sinus node dysfunction (HCC)    hx of PPM   Sustained ventricular tachycardia (HCC)    s/p ICD 05/2019    Past Surgical History:  Procedure Laterality Date   ABDOMINAL HYSTERECTOMY  1984   APPENDECTOMY     EYE SURGERY  2014   Rght eye   ICD IMPLANT N/A 06/28/2019   Procedure: PPM upgrade to ICD;  Surgeon: Tammie Fall, MD;  Location: [facility] INVASIVE CV LAB;  Service: Cardiovascular;  Laterality: N/A;   PACEMAKER GENERATOR CHANGE  2014   Medtronic adapta   PACEMAKER GENERATOR CHANGE N/A 08/29/2013   Procedure: PACEMAKER GENERATOR CHANGE;  Surgeon: Luana Rumple, MD;  Location: [facility] CATH LAB;  Service: Cardiovascular;  Laterality: N/A;   PACEMAKER INSERTION  08/2006   dual chamber Medtronic EnR[place]; r/t sinus node dysfunction    REPAIR OF ACUTE ASCENDING THORACIC AORTIC DISSECTION  2008   Dr. Nicanor Barge   RIGHT/LEFT HEART CATH AND CORONARY ANGIOGRAPHY N/A 01/09/2020   Procedure: RIGHT/LEFT HEART CATH AND CORONARY ANGIOGRAPHY;  Surgeon: Arnoldo Lapping, MD;  Location: [facility] INVASIVE CV LAB;  Service: Cardiovascular;  Laterality: N/A;   TEE WITHOUT CARDIOVERSION N/A 01/29/2020   Procedure: TRANSESOPHAGEAL ECHOCARDIOGRAM (TEE);  Surgeon: Arnoldo Lapping, MD;  Location: [facility] OR;  Service: Open Heart Surgery;  Laterality: N/A;   THORACIC AORTIC ANEURYSM REPAIR  2000   type III   TRANSCATHETER AORTIC VALVE  REPLACEMENT, TRANSAPICAL N/A 01/29/2020   Procedure: TRANSCATHETER AORTIC VALVE REPLACEMENT, TRANSAPICAL using a Edwards Sapien 3, 23mm  Transcatheter Heart Valve;  Surgeon: Arnoldo Lapping, MD;  Location: [facility] OR;  Service: Open Heart Surgery;  Laterality: N/A;   TRANSTHORACIC ECHOCARDIOGRAM  09/20/2012   EF=>55% with mild conc LVH; LA mod dilated; RA mildly dilated; mild MR/TR/AR    Current Medications: Outpatient Medications Prior to Visit  Medication Sig Dispense Refill   ALPRAZolam  (XANAX ) 0.5 MG tablet TAKE 1 TABLET BY MOUTH 3 TIMES DAILY AS NEEDED. 90 tablet 0   aspirin  (ASPIRIN  LOW DOSE) 81 MG chewable tablet CHEW 1 TABLET BY MOUTH EVERY DAY 90 tablet 0   Blood Glucose Calibration (TRUE METRIX LEVEL 1) Low SOLN 1 each by In Vitro route in the morning and at bedtime. 100 each 3   Blood Glucose Monitoring Suppl (TRUE METRIX METER) DEVI 1 each by Does not apply route in the morning and at bedtime. 1 each 0   carvedilol  (COREG ) 12.5 MG tablet Take 1 tablet (12.5 mg total) by mouth 2 (two) times daily with a meal. 180 tablet 3   carvedilol  (COREG ) 25 MG tablet TAKE 1  TABLET BY MOUTH TWICE A DAY WITH FOOD 180 tablet 1   citalopram  (CELEXA ) 20 MG tablet TAKE 1 TABLET EVERY DAY ALONG WITH 40MG  TO EQUAL 60MG  TOTAL 90 tablet 1   citalopram  (CELEXA ) 40 MG tablet TAKE 1 TABLET EVERY DAY ALONG WITH 20MG  TO EQUAL 60MG  TOTAL, 90 tablet 1   fluticasone  (FLONASE ) 50 MCG/ACT nasal spray USE 2 SPRAYS IN EACH NOSTIRL EVERY DAY 16 g 1   furosemide  (LASIX ) 20 MG tablet TAKE 1 TABLET BY MOUTH EVERY DAY 90 tablet 3   furosemide  (LASIX ) 40 MG tablet Take 40 mg  as needed only for edema/weight gain >3lb in 24h or >5 lb in a week 20 tablet 6   furosemide  (LASIX ) 40 MG tablet TAKE 1/2 TABLET BY MOUTH DAILY 45 tablet 3   glucose blood (TRUE METRIX BLOOD GLUCOSE TEST) test strip USE AS INSTRUCTED TWICE A DAY IN THE MORNING AND AT BEDTIME 300 strip 12   hyoscyamine  (LEVSIN SL) 0.125 MG SL tablet TAKE 1 TABLET BY MOUTH  TWICE A DAY AS NEEDED FOR CRAMPING OR DIARRHEA OR LOOSE STOOLS 180 tablet 1   JARDIANCE  10 MG TABS tablet TAKE 1 TABLET BY MOUTH EVERY DAY 30 tablet 5   losartan  (COZAAR ) 25 MG tablet TAKE 1/2 TABLET BY MOUTH EVERY DAY AT BEDTIME 45 tablet 1   omeprazole  (PRILOSEC) 20 MG capsule Take 1 capsule (20 mg total) by mouth daily. 90 capsule 3   Potassium Chloride  ER 20 MEQ TBCR TAKE 1 TABLET BY MOUTH EVERY DAY 90 tablet 4   Probiotic Product (PRO-BIOTIC BLEND PO) Take 1 tablet by mouth daily.      rosuvastatin  (CRESTOR ) 20 MG tablet Take 1 tablet (20 mg total) by mouth daily. 90 tablet 0   Semaglutide , 1 MG/DOSE, 4 MG/3ML SOPN Inject 1 mg as directed once a week. 3 mL 3   TRUEplus Lancets 33G MISC TEST BLOOD SUGAR TWICE DAILY IN THE MORNING AND AT BEDTIME 200 each 12   ALPRAZolam  (XANAX ) 0.5 MG tablet TAKE 1 TABLET BY MOUTH 3 TIMES DAILY AS NEEDED. 90 tablet 0   Facility-Administered Medications Prior to Visit  Medication Dose Route Frequency Provider Last Rate Last Admin   diclofenac  sodium (VOLTAREN ) 1 % transdermal gel 2 g  2 g Topical QID Crecencio Dodge, Yvonne R, DO         Allergies:   Oxycodone , Vicodin [hydrocodone-acetaminophen ], Latex, Neomycin-bacitracin zn-polymyx, and Sulfonamide derivatives     Family History:  The patient's family history includes Heart attack in her father and maternal grandmother; Kidney disease in her mother; Valvular heart disease in her son.   ROS:   Please see the history of present illness.    ROS All other systems are reviewed and are negative.   PHYSICAL EXAM:   VS:  BP 126/80   Pulse 69   Ht 4\' 11"  (1.499 m)   Wt 179 lb 12.8 oz (81.6 kg)   SpO2 96%   BMI 36.32 kg/m      General: Alert, oriented x3, no distress, moderately obese Head: no evidence of trauma, PERRL, EOMI, no exophtalmos or lid lag, no myxedema, no xanthelasma; normal ears, nose and oropharynx Neck: normal jugular venous pulsations and no hepatojugular reflux; brisk carotid pulses  without delay and no carotid bruits Chest: clear to auscultation, no signs of consolidation by percussion or palpation, normal fremitus, symmetrical and full respiratory excursions Cardiovascular: normal position and quality of the apical impulse, regular rhythm, normal first and second heart sounds,  very faint 1/6 aortic ejection murmur at the right upper sternal border, early peaking, no diastolic murmurs, rubs or gallops Abdomen: no tenderness or distention, no masses by palpation, no abnormal pulsatility or arterial bruits, normal bowel sounds, no hepatosplenomegaly Extremities: no clubbing, cyanosis or edema; 2+ radial, ulnar and brachial pulses bilaterally; 2+ right femoral, posterior tibial and dorsalis pedis pulses; 2+ left femoral, posterior tibial and dorsalis pedis pulses; no subclavian or femoral bruits Neurological: grossly nonfocal Psych: Normal mood and affect   Wt Readings from Last 3 Encounters:  03/22/24 179 lb 12.8 oz (81.6 kg)  12/23/23 190 lb (86.2 kg)  11/16/23 182 lb (82.6 kg)      Studies/Labs Reviewed:   ECHO 04/18/2023  1. Left ventricular ejection fraction, by estimation, is 45 to 50%. The  left ventricle has mildly decreased function. The left ventricle  demonstrates global hypokinesis. There is mild concentric left ventricular  hypertrophy. Left ventricular diastolic  parameters are consistent with Grade I diastolic dysfunction (impaired  relaxation). Elevated left ventricular end-diastolic pressure.   2. Right ventricular systolic function is mildly reduced. The right  ventricular size is normal. There is normal pulmonary artery systolic  pressure.   3. Left atrial size was moderately dilated.   4. The mitral valve is normal in structure. Trivial mitral valve  regurgitation. No evidence of mitral stenosis.   5. The aortic valve has been repaired/replaced. Aortic valve  regurgitation is not visualized. No aortic stenosis is present. There is a  23 mm  Sapien prosthetic (TAVR) valve present in the aortic position. Echo  findings are consistent with normal  structure and function of the aortic valve prosthesis. Aortic valve area,  by VTI measures 1.07 cm. Aortic valve mean gradient measures 12.0 mmHg.  Aortic valve Vmax measures 2.32 m/s.   6. The inferior vena cava is dilated in size with <50% respiratory  variability, suggesting right atrial pressure of 15 mmHg.    EKG:    EKG Interpretation Date/Time:  Thursday March 22 2024 12:07:51 EDT Ventricular Rate:  69 PR Interval:  176 QRS Duration:  188 QT Interval:  474 QTC Calculation: 507 R Axis:   -78  Text Interpretation: AV dual-paced rhythm When compared with ECG of 16-Nov-2023 17:04, Vent. rate has increased BY   9 BPM Confirmed by Kanijah Groseclose [phone]) on 03/22/2024 12:39:44 PM       .  Recent Labs: 12/23/2023: ALT 10; BUN 26; Creat 1.33; Hemoglobin 12.8; Platelets 159; Potassium 4.7; Sodium 139; TSH 0.88   01/04/2024 creatinine 1.30,Potassium 4.9 hemoglobin 12.2 normal SPEP without monoclonal spike  Lipid Panel    Component Value Date/Time   CHOL 204 (H) 12/23/2023 1445   TRIG 147 12/23/2023 1445   HDL 66 12/23/2023 1445   CHOLHDL 3.1 12/23/2023 1445   VLDL 21.4 11/01/2023 1216   LDLCALC 112 (H) 12/23/2023 1445   LDLDIRECT 217.7 09/13/2011 1535     ASSESSMENT:    1. Chronic combined systolic and diastolic heart failure (HCC)   2. Pre-procedure lab exam   3. S/P TAVR (transcatheter aortic valve replacement)   4. SSS (sick sinus syndrome) (HCC)   5. CHB (complete heart block) (HCC)   6. ICD (implantable cardioverter-defibrillator) in place   7. Ventricular tachycardia (HCC)   8. Paroxysmal atrial flutter (HCC)   9. Dissection of ascending aorta (HCC)   10. Essential hypertension   11. Hypercholesterolemia   12. Type 2 diabetes mellitus with complication (HCC)   13. Stage 3b chronic kidney disease (  HCC)   14. Severe obesity (BMI 35.0-35.9 with  comorbidity) (HCC)      PLAN:  In order of problems listed above:  CHF: Clinically well compensated, NYHA functional class I and euvolemic.  OptiVol showed slight deviation but is almost back to baseline.  Blood pressure unlikely to tolerate Entresto and we recently had to decrease her dose of carvedilol , but she is on losartan  as well as Jardiance .  Has not required loop diuretics recently.  AS s/p transapical TAVR: Normal prosthetic valve function.  Due for repeat echo.  Refill the amoxicillin  for SBE prophylaxis for dental procedures today. SSS/AV block: She is pacemaker dependent due to both sinus node arrest and complete heart block.   ICD: Normal device function, followed by Dr. Carolynne Citron with remote downloads every 3 months.  Option to upgrade to CRT-D if LVEF declines, but her clinical situation is currently quite favorable and this does not appear to be imminently necessary.. VT: None has been seen since the last download.  No recurrence in over 3 years since her episode of sustained monomorphic VT AFlutter: Remote arrhythmia, none has been detected since this pacemaker was implanted 5 years ago.   Hx Ao dissection: Stable findings on yearly CT angiogram.  Repeat yearly.  Will need to recheck her creatinine before contrast exposure. HTN: Excellent control even after reducing the dose of carvedilol . HLP: Not sure why the LDL cholesterol is higher this year, it was only 70 a year ago.  Make sure to take the statin regularly, but cholesterol lowering is not one of the most important interventions in her case.  She had minimal nonobstructive RCA stenosis at the time of cardiac catheterization 2021, essentially unchanged when compared with study from 2008.  Mild aortic atherosclerosis seen on CT scans.  Continue statin. DM: Currently with excellent control on a combination of Ozempic  and Jardiance  with near normal hemoglobin A1c. CKD 3b:  creatinine baseline seems to be around 1.3, corresponding to  a GFR of approximately 40.  Not prohibitive for CT angiography. Obesity: Weight has fluctuated some but she remains in severe obesity range.     Medication Adjustments/Labs and Tests Ordered: Current medicines are reviewed at length with the patient today.  Concerns regarding medicines are outlined above.  Medication changes, Labs and Tests ordered today are listed in the Patient Instructions below. Patient Instructions  Medication Instructions:  Amoxicillin  2,000 mg- take 2 hours prior to dental procedures *If you need a refill on your cardiac medications before your next appointment, please call your pharmacy*  Lab Work: BMP today If you have labs (blood work) drawn today and your tests are completely normal, you will receive your results only by: MyChart Message (if you have MyChart) OR A paper copy in the mail If you have any lab test that is abnormal or we need to change your treatment, we will call you to review the results.  Testing/Procedure Your physician has requested that you have an echocardiogram. Echocardiography is a painless test that uses sound waves to create images of your heart. It provides your doctor with information about the size and shape of your heart and how well your heart's chambers and valves are working. This procedure takes approximately one hour. There are no restrictions for this procedure. Please do NOT wear cologne, perfume, aftershave, or lotions (deodorant is allowed). Please arrive 15 minutes prior to your appointment time.  Please note: We ask at that you not bring children with you during ultrasound (echo/ vascular) testing.  Due to room size and safety concerns, children are not allowed in the ultrasound rooms during exams. Our front office staff cannot provide observation of children in our lobby area while testing is being conducted. An adult accompanying a patient to their appointment will only be allowed in the ultrasound room at the discretion of  the ultrasound technician under special circumstances. We apologize for any inconvenience.   CT Angio of Chest, Abdomen, and Pelvis- you will receive a phone call to schedule this procedure  Follow-Up: At [facility], you and your health needs are our priority.  As part of our continuing mission to provide you with exceptional heart care, our providers are all part of one team.  This team includes your primary Cardiologist (physician) and Advanced Practice Providers or APPs (Physician Assistants and Nurse Practitioners) who all work together to provide you with the care you need, when you need it.  Your next appointment:   1 year(s)  Provider:   Luana Rumple, MD     We recommend signing up for the patient portal called "MyChart".  Sign up information is provided on this After Visit Summary.  MyChart is used to connect with patients for Virtual Visits (Telemedicine).  Patients are able to view lab/test results, encounter notes, upcoming appointments, etc.  Non-urgent messages can be sent to your provider as well.   To learn more about what you can do with MyChart, go to [email or web address].        1st Floor: - Lobby - Registration  - Pharmacy  - Lab - Cafe  2nd Floor: - PV Lab - Diagnostic Testing (echo, CT, nuclear med)  3rd Floor: - Vacant  4th Floor: - TCTS (cardiothoracic surgery) - AFib Clinic - Structural Heart Clinic - Vascular Surgery  - Vascular Ultrasound  5th Floor: - HeartCare Cardiology (general and EP) - Clinical Pharmacy for coumadin, hypertension, lipid, weight-loss medications, and med management appointments    Valet parking services will be available as well.      Signed, Luana Rumple, MD  03/22/2024 7:31 PM    [facility] [address] [place], [place], [place]  [address] Phone: [phone]; Fax: [phone]

## 2024-03-22 NOTE — Patient Instructions (Addendum)
 Medication Instructions:  Amoxicillin  2,000 mg- take 2 hours prior to dental procedures *If you need a refill on your cardiac medications before your next appointment, please call your pharmacy*  Lab Work: BMP today If you have labs (blood work) drawn today and your tests are completely normal, you will receive your results only by: MyChart Message (if you have MyChart) OR A paper copy in the mail If you have any lab test that is abnormal or we need to change your treatment, we will call you to review the results.  Testing/Procedure Your physician has requested that you have an echocardiogram. Echocardiography is a painless test that uses sound waves to create images of your heart. It provides your doctor with information about the size and shape of your heart and how well your heart's chambers and valves are working. This procedure takes approximately one hour. There are no restrictions for this procedure. Please do NOT wear cologne, perfume, aftershave, or lotions (deodorant is allowed). Please arrive 15 minutes prior to your appointment time.  Please note: We ask at that you not bring children with you during ultrasound (echo/ vascular) testing. Due to room size and safety concerns, children are not allowed in the ultrasound rooms during exams. Our front office staff cannot provide observation of children in our lobby area while testing is being conducted. An adult accompanying a patient to their appointment will only be allowed in the ultrasound room at the discretion of the ultrasound technician under special circumstances. We apologize for any inconvenience.   CT Angio of Chest, Abdomen, and Pelvis- you will receive a phone call to schedule this procedure  Follow-Up: At [facility], you and your health needs are our priority.  As part of our continuing mission to provide you with exceptional heart care, our providers are all part of one team.  This team includes your primary  Cardiologist (physician) and Advanced Practice Providers or APPs (Physician Assistants and Nurse Practitioners) who all work together to provide you with the care you need, when you need it.  Your next appointment:   1 year(s)  Provider:   Luana Rumple, MD     We recommend signing up for the patient portal called "MyChart".  Sign up information is provided on this After Visit Summary.  MyChart is used to connect with patients for Virtual Visits (Telemedicine).  Patients are able to view lab/test results, encounter notes, upcoming appointments, etc.  Non-urgent messages can be sent to your provider as well.   To learn more about what you can do with MyChart, go to [email or web address].        1st Floor: - Lobby - Registration  - Pharmacy  - Lab - Cafe  2nd Floor: - PV Lab - Diagnostic Testing (echo, CT, nuclear med)  3rd Floor: - Vacant  4th Floor: - TCTS (cardiothoracic surgery) - AFib Clinic - Structural Heart Clinic - Vascular Surgery  - Vascular Ultrasound  5th Floor: - HeartCare Cardiology (general and EP) - Clinical Pharmacy for coumadin, hypertension, lipid, weight-loss medications, and med management appointments    Valet parking services will be available as well.

## 2024-03-22 NOTE — Telephone Encounter (Signed)
 Copied from CRM [id number]. Topic: Clinical - Prescription Issue >> Mar 22, 2024  4:23 PM Jethro Morrison wrote: Reason for CRM: PT CALLED VERY UPSET HER PRESCRIPTION WAS NOT FILLED ON YESTERDAY WHEN SHE SENT THE REQ ON MY CHART. ADV PT IT COULD TAKE 3 BUSINESS DAYS TO FILL. PT STATED SHE IS SUPPOSED TO GO OUT OF TOWN. ALPRAZolam  (XANAX ) 0.5 MG tablet

## 2024-03-23 NOTE — Telephone Encounter (Signed)
 Rx sent yesterday

## 2024-04-06 NOTE — Addendum Note (Signed)
 Addended by: Lott Rouleau A on: 04/06/2024 03:38 PM   Modules accepted: Orders

## 2024-04-06 NOTE — Progress Notes (Signed)
 Remote ICD transmission.

## 2024-04-14 ENCOUNTER — Encounter: Payer: Self-pay | Admitting: Family Medicine

## 2024-04-14 ENCOUNTER — Other Ambulatory Visit: Payer: Self-pay | Admitting: Family Medicine

## 2024-04-14 ENCOUNTER — Other Ambulatory Visit: Payer: Self-pay | Admitting: Cardiovascular Disease

## 2024-04-14 DIAGNOSIS — E1165 Type 2 diabetes mellitus with hyperglycemia: Secondary | ICD-10-CM

## 2024-04-14 DIAGNOSIS — F419 Anxiety disorder, unspecified: Secondary | ICD-10-CM

## 2024-04-15 ENCOUNTER — Other Ambulatory Visit: Payer: Self-pay | Admitting: Cardiovascular Disease

## 2024-04-15 DIAGNOSIS — I10 Essential (primary) hypertension: Secondary | ICD-10-CM

## 2024-04-16 ENCOUNTER — Encounter: Payer: Self-pay | Admitting: Family Medicine

## 2024-04-16 ENCOUNTER — Other Ambulatory Visit: Payer: Self-pay | Admitting: Family Medicine

## 2024-04-16 DIAGNOSIS — E1165 Type 2 diabetes mellitus with hyperglycemia: Secondary | ICD-10-CM

## 2024-04-16 DIAGNOSIS — F419 Anxiety disorder, unspecified: Secondary | ICD-10-CM

## 2024-04-16 MED ORDER — SEMAGLUTIDE (1 MG/DOSE) 4 MG/3ML ~~LOC~~ SOPN: 1.0000 mg | PEN_INJECTOR | SUBCUTANEOUS | 3 refills | Status: DC

## 2024-04-16 MED ORDER — ALPRAZOLAM 0.5 MG PO TABS
0.5000 mg | ORAL_TABLET | Freq: Three times a day (TID) | ORAL | 0 refills | Status: DC | PRN
Start: 2024-04-16 — End: 2024-05-21

## 2024-04-16 NOTE — Telephone Encounter (Signed)
 Requesting: Xanax  Contract: 11/01/23 UDS: 11/01/2023 Last OV: 12/23/2023 Next OV: n/a Last Refill: 03/22/2024, #[id number] RF Database:   Please advise

## 2024-04-16 NOTE — Telephone Encounter (Signed)
 Increase med?

## 2024-04-27 ENCOUNTER — Other Ambulatory Visit (HOSPITAL_COMMUNITY)

## 2024-05-15 ENCOUNTER — Telehealth (HOSPITAL_COMMUNITY): Payer: Self-pay | Admitting: Cardiovascular Disease

## 2024-05-15 NOTE — Telephone Encounter (Signed)
 Patient called and cancelled echocardiogram x 2 for reason below:  05/15/2024 9:39 AM XB:JYNW, [name]  Cancel Rsn: Patient (Pt is out of town. Pt will c/b to r/s) Pt has cancelled x 2   Order will be removed from the echo WQ and when she calls back to reschedule we will reinstate the order. Thank you.

## 2024-05-20 ENCOUNTER — Encounter: Payer: Self-pay | Admitting: Family Medicine

## 2024-05-20 DIAGNOSIS — F419 Anxiety disorder, unspecified: Secondary | ICD-10-CM

## 2024-05-21 ENCOUNTER — Other Ambulatory Visit: Payer: Self-pay | Admitting: Family Medicine

## 2024-05-21 DIAGNOSIS — F419 Anxiety disorder, unspecified: Secondary | ICD-10-CM

## 2024-05-21 MED ORDER — ALPRAZOLAM 0.5 MG PO TABS: 0.5000 mg | ORAL_TABLET | Freq: Three times a day (TID) | ORAL | 0 refills | Status: DC | PRN

## 2024-05-21 NOTE — Telephone Encounter (Signed)
 Duplicate request

## 2024-05-21 NOTE — Telephone Encounter (Signed)
 Requesting: Xanax  Contract: 11/01/2023 UDS: 11/01/2023 Last OV: 12/23/2023 Next OV: n/a Last Refill: 04/16/2024, #90--0 RF      Please advise

## 2024-05-22 ENCOUNTER — Ambulatory Visit: Payer: Medicare HMO

## 2024-05-22 ENCOUNTER — Other Ambulatory Visit (HOSPITAL_COMMUNITY)

## 2024-05-22 DIAGNOSIS — I495 Sick sinus syndrome: Secondary | ICD-10-CM

## 2024-05-28 ENCOUNTER — Ambulatory Visit (INDEPENDENT_AMBULATORY_CARE_PROVIDER_SITE_OTHER)

## 2024-05-28 DIAGNOSIS — I495 Sick sinus syndrome: Secondary | ICD-10-CM | POA: Diagnosis not present

## 2024-05-29 LAB — CUP PACEART REMOTE DEVICE CHECK
Battery Remaining Longevity: 27 mo
Battery Voltage: 2.93 V
Brady Statistic AP VP Percent: 91.7 %
Brady Statistic AP VS Percent: 0.16 %
Brady Statistic AS VP Percent: 8.13 %
Brady Statistic AS VS Percent: 0.01 %
Brady Statistic RA Percent Paced: 91.76 %
Brady Statistic RV Percent Paced: 99.73 %
Date Time Interrogation Session: 20250630223244
HighPow Impedance: 81 Ohm
Implantable Lead Connection Status: 753985
Implantable Lead Connection Status: 753985
Implantable Lead Connection Status: 753985
Implantable Lead Implant Date: 20071016
Implantable Lead Implant Date: 20071016
Implantable Lead Implant Date: 20200730
Implantable Lead Location: 753859
Implantable Lead Location: 753860
Implantable Lead Location: 753860
Implantable Lead Model: 4092
Implantable Lead Model: 5594
Implantable Lead Model: 6935
Implantable Pulse Generator Implant Date: 20200730
Lead Channel Impedance Value: 380 Ohm
Lead Channel Impedance Value: 551 Ohm
Lead Channel Impedance Value: 570 Ohm
Lead Channel Pacing Threshold Amplitude: 0.5 V
Lead Channel Pacing Threshold Amplitude: 0.75 V
Lead Channel Pacing Threshold Pulse Width: 0.4 ms
Lead Channel Pacing Threshold Pulse Width: 0.4 ms
Lead Channel Sensing Intrinsic Amplitude: 2.875 mV
Lead Channel Sensing Intrinsic Amplitude: 2.875 mV
Lead Channel Sensing Intrinsic Amplitude: 26.375 mV
Lead Channel Sensing Intrinsic Amplitude: 26.375 mV
Lead Channel Setting Pacing Amplitude: 1.75 V
Lead Channel Setting Pacing Amplitude: 2.5 V
Lead Channel Setting Pacing Pulse Width: 0.4 ms
Lead Channel Setting Sensing Sensitivity: 0.3 mV
Zone Setting Status: 755011
Zone Setting Status: 755011

## 2024-05-31 ENCOUNTER — Other Ambulatory Visit: Payer: Self-pay | Admitting: Cardiovascular Disease

## 2024-05-31 ENCOUNTER — Other Ambulatory Visit: Payer: Self-pay | Admitting: Family Medicine

## 2024-05-31 ENCOUNTER — Ambulatory Visit: Payer: Self-pay | Admitting: Cardiovascular Disease

## 2024-05-31 DIAGNOSIS — E1169 Type 2 diabetes mellitus with other specified complication: Secondary | ICD-10-CM

## 2024-05-31 DIAGNOSIS — I5032 Chronic diastolic (congestive) heart failure: Secondary | ICD-10-CM

## 2024-06-15 ENCOUNTER — Other Ambulatory Visit: Payer: Self-pay | Admitting: Family Medicine

## 2024-06-15 DIAGNOSIS — E1169 Type 2 diabetes mellitus with other specified complication: Secondary | ICD-10-CM

## 2024-06-16 ENCOUNTER — Other Ambulatory Visit: Payer: Self-pay | Admitting: Cardiovascular Disease

## 2024-06-18 ENCOUNTER — Other Ambulatory Visit: Payer: Self-pay | Admitting: Family Medicine

## 2024-06-18 DIAGNOSIS — F419 Anxiety disorder, unspecified: Secondary | ICD-10-CM

## 2024-06-18 NOTE — Telephone Encounter (Signed)
 Copied from CRM [id number]. Topic: Clinical - Medication Refill >> Jun 18, 2024 10:02 AM Henretta I wrote: Medication:  ALPRAZolam  (XANAX ) 0.5 MG tablet    Has the patient contacted their pharmacy? Yes, pharmacy has sent request already  (Agent: If no, request that the patient contact the pharmacy for the refill. If patient does not wish to contact the pharmacy document the reason why and proceed with request.) (Agent: If yes, when and what did the pharmacy advise?)  This is the patient's preferred pharmacy:  [id number] [address] [facility] - [address] [place] [address] Phone: [phone] Fax: [phone]  Is this the correct pharmacy for this prescription? Yes If no, delete pharmacy and type the correct one.   Has the prescription been filled recently? No  Is the patient out of the medication? No patient will be out tomorrow, patient leaves Wednesday to go out of town   Has the patient been seen for an appointment in the last year OR does the patient have an upcoming appointment? Yes  Can we respond through MyChart? Yes  Agent: Please be advised that Rx refills may take up to 3 business days. We ask that you follow-up with your pharmacy.

## 2024-06-18 NOTE — Telephone Encounter (Signed)
 Requesting: Xanax  Contract: 11/02/23 UDS: 11/02/23 Last OV: 12/23/2023 Next OV: n/a Last Refill: 05/21/24, #90--0 Rf Database:   Please advise

## 2024-06-18 NOTE — Telephone Encounter (Signed)
 Duplicate request

## 2024-07-05 ENCOUNTER — Other Ambulatory Visit: Payer: Self-pay | Admitting: Family Medicine

## 2024-07-05 DIAGNOSIS — E1169 Type 2 diabetes mellitus with other specified complication: Secondary | ICD-10-CM

## 2024-07-17 ENCOUNTER — Other Ambulatory Visit: Payer: Self-pay | Admitting: Family

## 2024-07-17 DIAGNOSIS — F419 Anxiety disorder, unspecified: Secondary | ICD-10-CM

## 2024-07-18 ENCOUNTER — Other Ambulatory Visit: Payer: Self-pay | Admitting: Cardiovascular Disease

## 2024-07-18 ENCOUNTER — Other Ambulatory Visit: Payer: Self-pay | Admitting: Family Medicine

## 2024-07-18 DIAGNOSIS — E1169 Type 2 diabetes mellitus with other specified complication: Secondary | ICD-10-CM

## 2024-07-19 ENCOUNTER — Other Ambulatory Visit: Payer: Self-pay

## 2024-07-19 ENCOUNTER — Other Ambulatory Visit: Payer: Self-pay | Admitting: Family Medicine

## 2024-07-19 ENCOUNTER — Telehealth: Payer: Self-pay | Admitting: Cardiovascular Disease

## 2024-07-19 DIAGNOSIS — F419 Anxiety disorder, unspecified: Secondary | ICD-10-CM

## 2024-07-19 DIAGNOSIS — I5032 Chronic diastolic (congestive) heart failure: Secondary | ICD-10-CM

## 2024-07-19 DIAGNOSIS — E1165 Type 2 diabetes mellitus with hyperglycemia: Secondary | ICD-10-CM

## 2024-07-19 DIAGNOSIS — Z952 Presence of prosthetic heart valve: Secondary | ICD-10-CM

## 2024-07-19 MED ORDER — ALPRAZOLAM 0.5 MG PO TABS: 0.5000 mg | ORAL_TABLET | Freq: Three times a day (TID) | ORAL | 0 refills | Status: DC | PRN

## 2024-07-19 NOTE — Telephone Encounter (Signed)
 Copied from CRM [id number]. Topic: Clinical - Medical Advice >> Jul 19, 2024 10:29 AM Thersia BROCKS wrote: Reason for CRM: Patient called in regarding the medication ALPRAZolam  (XANAX ) 0.5 MG tablet , patient stated she has requested it a couple of days ago and is now out, I didn't put in a refill request for patient, but patient wants to speak with a nurse regarding this >> Jul 19, 2024  1:38 PM Chiquita SQUIBB wrote: Patient is calling in again regarding the medication patient stated she needs the medication today and has not heard back.

## 2024-07-19 NOTE — Telephone Encounter (Signed)
 Spoke with pt, echo and CTA scheduled. She also is thinking she may need a MRI but I do not see anything in the chart that would indicate a MRI is needed. Aware will forward to dr croitoru to see if MRI is indicated.

## 2024-07-19 NOTE — Telephone Encounter (Signed)
 Spoke with pt, aware of dr croitoru's advise.

## 2024-07-19 NOTE — Telephone Encounter (Signed)
 Looks like initial refill request was sent to [facility].    Requesting: Xanax  Contract: 10/2023 UDS: 10/2023 Last OV: 12/23/23 Next OV: n/a Last Refill: 06/18/2024, #90--0 RF Database:   Please advise

## 2024-07-19 NOTE — Telephone Encounter (Signed)
 Pt is requesting a callback from nurse regarding her wanting to know about having an Echo, MRI and CT done. She'd like to gain clarity since last office visit. Please advise.

## 2024-07-19 NOTE — Telephone Encounter (Unsigned)
 Copied from CRM [id number]. Topic: Clinical - Medication Refill >> Jul 19, 2024 10:28 AM Thersia BROCKS wrote: Medication: ALPRAZolam  (XANAX ) 0.5 MG tablet  Has the patient contacted their pharmacy? Yes (Agent: If no, request that the patient contact the pharmacy for the refill. If patient does not wish to contact the pharmacy document the reason why and proceed with request.) (Agent: If yes, when and what did the pharmacy advise?)  This is the patient's preferred pharmacy:  [id number] [address] [facility] - [address] [place] [address] Phone: [phone] Fax: [phone]   Is this the correct pharmacy for this prescription? Yes If no, delete pharmacy and type the correct one.   Has the prescription been filled recently? No  Is the patient out of the medication? Yes  Has the patient been seen for an appointment in the last year OR does the patient have an upcoming appointment? Yes  Can we respond through MyChart? Yes  Agent: Please be advised that Rx refills may take up to 3 business days. We ask that you follow-up with your pharmacy.

## 2024-07-19 NOTE — Telephone Encounter (Signed)
 We have discussed MRA as an alternative to the CT angiogram in case her kidney function deteriorates, but if her creatinine remains in a similar range, CT remains appropriate.  She does not need both tests.

## 2024-07-19 NOTE — Telephone Encounter (Signed)
 Copied from CRM [id number]. Topic: Clinical - Medical Advice >> Jul 19, 2024 10:29 AM Thersia BROCKS wrote: Reason for CRM: Patient called in regarding the medication ALPRAZolam  (XANAX ) 0.5 MG tablet , patient stated she has requested it a couple of days ago and is now out, I didn't put in a refill request for patient, but patient wants to speak with a nurse regarding this

## 2024-07-20 NOTE — Telephone Encounter (Signed)
 Rx sent on 07/19/24.

## 2024-08-09 ENCOUNTER — Encounter: Payer: Self-pay | Admitting: Family Medicine

## 2024-08-09 ENCOUNTER — Ambulatory Visit: Admitting: Family Medicine

## 2024-08-09 ENCOUNTER — Telehealth: Payer: Self-pay

## 2024-08-09 VITALS — BP 140/80 | HR 91 | Temp 97.6°F | Ht 59.0 in | Wt 186.0 lb

## 2024-08-09 DIAGNOSIS — N1832 Chronic kidney disease, stage 3b: Secondary | ICD-10-CM | POA: Diagnosis not present

## 2024-08-09 DIAGNOSIS — E785 Hyperlipidemia, unspecified: Secondary | ICD-10-CM | POA: Diagnosis not present

## 2024-08-09 DIAGNOSIS — I4892 Unspecified atrial flutter: Secondary | ICD-10-CM | POA: Diagnosis not present

## 2024-08-09 DIAGNOSIS — I350 Nonrheumatic aortic (valve) stenosis: Secondary | ICD-10-CM

## 2024-08-09 DIAGNOSIS — E1165 Type 2 diabetes mellitus with hyperglycemia: Secondary | ICD-10-CM

## 2024-08-09 DIAGNOSIS — Z0000 Encounter for general adult medical examination without abnormal findings: Secondary | ICD-10-CM | POA: Diagnosis not present

## 2024-08-09 DIAGNOSIS — Z23 Encounter for immunization: Secondary | ICD-10-CM

## 2024-08-09 DIAGNOSIS — I10 Essential (primary) hypertension: Secondary | ICD-10-CM | POA: Diagnosis not present

## 2024-08-09 DIAGNOSIS — F419 Anxiety disorder, unspecified: Secondary | ICD-10-CM

## 2024-08-09 DIAGNOSIS — E1169 Type 2 diabetes mellitus with other specified complication: Secondary | ICD-10-CM

## 2024-08-09 LAB — LIPID PANEL
Cholesterol: 152 mg/dL (ref 0–200)
HDL: 55.5 mg/dL (ref >39.00)
LDL Cholesterol: 79 mg/dL (ref 0–99)
NonHDL: 96.01
Total CHOL/HDL Ratio: 3
Triglycerides: 87 mg/dL (ref 0.0–149.0)
VLDL: 17.4 mg/dL (ref 0.0–40.0)

## 2024-08-09 LAB — CBC WITH DIFFERENTIAL/PLATELET
Basophils Absolute: 0 K/uL (ref 0.0–0.1)
Basophils Relative: 0.4 % (ref 0.0–3.0)
Eosinophils Absolute: 0.1 K/uL (ref 0.0–0.7)
Eosinophils Relative: 1.3 % (ref 0.0–5.0)
HCT: 36.1 % (ref 36.0–46.0)
Hemoglobin: 12.1 g/dL (ref 12.0–15.0)
Lymphocytes Relative: 16.4 % (ref 12.0–46.0)
Lymphs Abs: 0.8 K/uL (ref 0.7–4.0)
MCHC: 33.6 g/dL (ref 30.0–36.0)
MCV: 103.1 fl — ABNORMAL HIGH (ref 78.0–100.0)
Monocytes Absolute: 0.3 K/uL (ref 0.1–1.0)
Monocytes Relative: 6.3 % (ref 3.0–12.0)
Neutro Abs: 3.7 K/uL (ref 1.4–7.7)
Neutrophils Relative %: 75.6 % (ref 43.0–77.0)
Platelets: 128 K/uL — ABNORMAL LOW (ref 150.0–400.0)
RBC: 3.5 Mil/uL — ABNORMAL LOW (ref 3.87–5.11)
RDW: 13.7 % (ref 11.5–15.5)
WBC: 4.9 K/uL (ref 4.0–10.5)

## 2024-08-09 LAB — MICROALBUMIN / CREATININE URINE RATIO
Creatinine,U: 112.5 mg/dL
Microalb Creat Ratio: 13.3 mg/g (ref 0.0–30.0)
Microalb, Ur: 1.5 mg/dL (ref 0.0–1.9)

## 2024-08-09 LAB — COMPREHENSIVE METABOLIC PANEL WITH GFR
ALT: 11 U/L (ref 0–35)
AST: 12 U/L (ref 0–37)
Albumin: 4.2 g/dL (ref 3.5–5.2)
Alkaline Phosphatase: 45 U/L (ref 39–117)
BUN: 17 mg/dL (ref 6–23)
CO2: 29 meq/L (ref 19–32)
Calcium: 9.6 mg/dL (ref 8.4–10.5)
Chloride: 106 meq/L (ref 96–112)
Creatinine, Ser: 1.13 mg/dL (ref 0.40–1.20)
GFR: 46.82 mL/min — ABNORMAL LOW (ref >60.00)
Glucose, Bld: 88 mg/dL (ref 70–99)
Potassium: 4.4 meq/L (ref 3.5–5.1)
Sodium: 142 meq/L (ref 135–145)
Total Bilirubin: 0.4 mg/dL (ref 0.2–1.2)
Total Protein: 6.4 g/dL (ref 6.0–8.3)

## 2024-08-09 LAB — HEMOGLOBIN A1C: Hgb A1c MFr Bld: 6.2 % (ref 4.6–6.5)

## 2024-08-09 LAB — TSH: TSH: 0.98 u[IU]/mL (ref 0.35–5.50)

## 2024-08-09 MED ORDER — CLONAZEPAM 0.5 MG PO TABS: 0.5000 mg | ORAL_TABLET | Freq: Three times a day (TID) | ORAL | 1 refills | Status: DC | PRN

## 2024-08-09 NOTE — Assessment & Plan Note (Signed)
Per vascular / cardiology ---

## 2024-08-09 NOTE — Patient Instructions (Signed)
 Preventive Care 83 Years and Older, Female Preventive care refers to lifestyle choices and visits with your health care provider that can promote health and wellness. Preventive care visits are also called wellness exams. What can I expect for my preventive care visit? Counseling Your health care provider may ask you questions about your: Medical history, including: Past medical problems. Family medical history. Pregnancy and menstrual history. History of falls. Current health, including: Memory and ability to understand (cognition). Emotional well-being. Home life and relationship well-being. Sexual activity and sexual health. Lifestyle, including: Alcohol, nicotine or tobacco, and drug use. Access to firearms. Diet, exercise, and sleep habits. Work and work Astronomer. Sunscreen use. Safety issues such as seatbelt and bike helmet use. Physical exam Your health care provider will check your: Height and weight. These may be used to calculate your BMI (body mass index). BMI is a measurement that tells if you are at a healthy weight. Waist circumference. This measures the distance around your waistline. This measurement also tells if you are at a healthy weight and may help predict your risk of certain diseases, such as type 2 diabetes and high blood pressure. Heart rate and blood pressure. Body temperature. Skin for abnormal spots. What immunizations do I need?  Vaccines are usually given at various ages, according to a schedule. Your health care provider will recommend vaccines for you based on your age, medical history, and lifestyle or other factors, such as travel or where you work. What tests do I need? Screening Your health care provider may recommend screening tests for certain conditions. This may include: Lipid and cholesterol levels. Hepatitis C test. Hepatitis B test. HIV (human immunodeficiency virus) test. STI (sexually transmitted infection) testing, if you are at  risk. Lung cancer screening. Colorectal cancer screening. Diabetes screening. This is done by checking your blood sugar (glucose) after you have not eaten for a while (fasting). Mammogram. Talk with your health care provider about how often you should have regular mammograms. BRCA-related cancer screening. This may be done if you have a family history of breast, ovarian, tubal, or peritoneal cancers. Bone density scan. This is done to screen for osteoporosis. Talk with your health care provider about your test results, treatment options, and if necessary, the need for more tests. Follow these instructions at home: Eating and drinking  Eat a diet that includes fresh fruits and vegetables, whole grains, lean protein, and low-fat dairy products. Limit your intake of foods with high amounts of sugar, saturated fats, and salt. Take vitamin and mineral supplements as recommended by your health care provider. Do not drink alcohol if your health care provider tells you not to drink. If you drink alcohol: Limit how much you have to 0-1 drink a day. Know how much alcohol is in your drink. In the [place] one drink equals one 12 oz bottle of beer (355 mL), one 5 oz glass of wine (148 mL), or one 1 oz glass of hard liquor (44 mL). Lifestyle Brush your teeth every morning and night with fluoride toothpaste. Floss one time each day. Exercise for at least 30 minutes 5 or more days each week. Do not use any products that contain nicotine or tobacco. These products include cigarettes, chewing tobacco, and vaping devices, such as e-cigarettes. If you need help quitting, ask your health care provider. Do not use drugs. If you are sexually active, practice safe sex. Use a condom or other form of protection in order to prevent STIs. Take aspirin only as told by  your health care provider. Make sure that you understand how much to take and what form to take. Work with your health care provider to find out whether it  is safe and beneficial for you to take aspirin daily. Ask your health care provider if you need to take a cholesterol-lowering medicine (statin). Find healthy ways to manage stress, such as: Meditation, yoga, or listening to music. Journaling. Talking to a trusted person. Spending time with friends and family. Minimize exposure to UV radiation to reduce your risk of skin cancer. Safety Always wear your seat belt while driving or riding in a vehicle. Do not drive: If you have been drinking alcohol. Do not ride with someone who has been drinking. When you are tired or distracted. While texting. If you have been using any mind-altering substances or drugs. Wear a helmet and other protective equipment during sports activities. If you have firearms in your house, make sure you follow all gun safety procedures. What's next? Visit your health care provider once a year for an annual wellness visit. Ask your health care provider how often you should have your eyes and teeth checked. Stay up to date on all vaccines. This information is not intended to replace advice given to you by your health care provider. Make sure you discuss any questions you have with your health care provider. Document Revised: 05/13/2021 Document Reviewed: 05/13/2021 Elsevier Patient Education  2024 [employer].

## 2024-08-09 NOTE — Telephone Encounter (Signed)
 [facility]- informed of therapy change.

## 2024-08-09 NOTE — Assessment & Plan Note (Signed)
 Well controlled, no changes to meds. Encouraged heart healthy diet such as the DASH diet and exercise as tolerated.

## 2024-08-09 NOTE — Progress Notes (Unsigned)
 Subjective:    Patient ID: [name], female    DOB: 1946-11-18, 78 y.o.   MRN: [id number]  Chief Complaint  Patient presents with   Annual Exam    Physical; no concerns; pt is fasting; asking for labs to include  BMET that's needing for cardio as well; would like flu shot    HPI Patient is in today for cpe.   Discussed the use of AI scribe software for clinical note transcription with the patient, who gave verbal consent to proceed.  History of Present Illness [name] is a 78 year old female with anxiety and depression who presents with increased anxiety related to family stressors.  She experiences increased anxiety due to family stressors, particularly involving her daughter and son. Her daughter, who lives with her, has been causing significant stress over issues related to inheritance and property distribution, leading to frequent arguments and heightened anxiety. Her daughter is often in a bad mood, which exacerbates the situation.  She has a history of anxiety and depression, for which she takes Xanax  three times a day and citalopram . She is concerned about her dependency on Xanax  and inquires about increasing her medication dosage or switching medications to better manage her symptoms. She mentions nearly dying twice in the past, which contributes to her anxiety.  She is currently dealing with family disputes over her will and property distribution. Her daughter discovered the will and was upset about the distribution of properties, leading to conflicts with her son.  She mentions upcoming cardiac evaluations, including an MRI, CT, and echocardiogram, and needs to have blood work done prior to these procedures. She has a history of nearly dying twice, which she mentioned as contributing to her overall anxiety.  She uses [place] and a freeze remedy for joint pain, which she states has been effective. She also mentions bruising from needles but does not report any  current joint issues.  Her social history includes living with her daughter, who is 49 years old and works as a Production manager for a [employer]. Her daughter previously had a relationship with a man who turned out to be a drug addict, which ended. She expresses concern over her daughter's financial decisions, such as buying new furniture and redoing the beach house, despite financial constraints.    Past Medical History:  Diagnosis Date   Acute thoracic aortic dissection [facility]) 2008   emergency surgery - [facility]   AICD (automatic cardioverter/defibrillator) present    Anxiety    Cancer (HCC)    skin   CKD (chronic kidney disease) stage 3, GFR 30-59 ml/min (HCC)    Diabetes mellitus    Diverticulosis    GERD (gastroesophageal reflux disease)    Hemorrhoids    History of kidney stones    Hyperlipidemia    Hypertension    Kidney stones    S/P TAVR (transcatheter aortic valve replacement) 01/29/2020   s/p TAVR with a 23 mm Edwards Sapien 3 Ultra via the TA approach wtih Dr. Wonda and Dr. Lucas   Severe aortic stenosis    Sinus node dysfunction (HCC)    hx of PPM   Sustained ventricular tachycardia (HCC)    s/p ICD 05/2019    Past Surgical History:  Procedure Laterality Date   ABDOMINAL HYSTERECTOMY  1984   APPENDECTOMY     EYE SURGERY  2014   Rght eye   ICD IMPLANT N/A 06/28/2019   Procedure: PPM upgrade to ICD;  Surgeon: Waddell,  Danelle ORN, MD;  Location: [facility] INVASIVE CV LAB;  Service: Cardiovascular;  Laterality: N/A;   PACEMAKER GENERATOR CHANGE  2014   Medtronic adapta   PACEMAKER GENERATOR CHANGE N/A 08/29/2013   Procedure: PACEMAKER GENERATOR CHANGE;  Surgeon: Jerel Balding, MD;  Location: [facility] CATH LAB;  Service: Cardiovascular;  Laterality: N/A;   PACEMAKER INSERTION  08/2006   dual chamber Medtronic EnRhythm; r/t sinus node dysfunction    REPAIR OF ACUTE ASCENDING THORACIC AORTIC DISSECTION  2008   Dr. Army   RIGHT/LEFT HEART CATH AND CORONARY ANGIOGRAPHY N/A  01/09/2020   Procedure: RIGHT/LEFT HEART CATH AND CORONARY ANGIOGRAPHY;  Surgeon: Wonda Sharper, MD;  Location: [facility] INVASIVE CV LAB;  Service: Cardiovascular;  Laterality: N/A;   TEE WITHOUT CARDIOVERSION N/A 01/29/2020   Procedure: TRANSESOPHAGEAL ECHOCARDIOGRAM (TEE);  Surgeon: Wonda Sharper, MD;  Location: [facility] OR;  Service: Open Heart Surgery;  Laterality: N/A;   THORACIC AORTIC ANEURYSM REPAIR  2000   type III   TRANSCATHETER AORTIC VALVE REPLACEMENT, TRANSAPICAL N/A 01/29/2020   Procedure: TRANSCATHETER AORTIC VALVE REPLACEMENT, TRANSAPICAL using a Edwards Sapien 3, 23mm  Transcatheter Heart Valve;  Surgeon: Wonda Sharper, MD;  Location: [facility] OR;  Service: Open Heart Surgery;  Laterality: N/A;   TRANSTHORACIC ECHOCARDIOGRAM  09/20/2012   EF=>55% with mild conc LVH; LA mod dilated; RA mildly dilated; mild MR/TR/AR    Family History  Problem Relation Age of Onset   Kidney disease Mother    Heart attack Father    Heart attack Maternal Grandmother    Valvular heart disease Son        valve replacement at 7   Cancer Neg Hx     Social History   Socioeconomic History   Marital status: Widowed    Spouse name: Not on file   Number of children: 2   Years of education: Not on file   Highest education level: 12th grade  Occupational History    Employer: RETIRED  Tobacco Use   Smoking status: Former    Current packs/day: 0.00    Types: Cigarettes    Quit date: 09/04/1993    Years since quitting: 30.9   Smokeless tobacco: Never  Vaping Use   Vaping status: Never Used  Substance and Sexual Activity   Alcohol use: No   Drug use: No   Sexual activity: Not Currently    Partners: Male  Other Topics Concern   Not on file  Social History Narrative   Lives at home and her daughter moved in with her    Social Drivers of [name]   Financial Resource Strain: Low Risk  (08/07/2024)   Overall Financial Resource Strain (CARDIA)    Difficulty of Paying Living Expenses: Not hard at all  Food  Insecurity: No Food Insecurity (08/07/2024)   Hunger Vital Sign    Worried About Running Out of Food in the Last Year: Never true    Ran Out of Food in the Last Year: Never true  Transportation Needs: No Transportation Needs (08/07/2024)   PRAPARE - Administrator, Civil Service (Medical): No    Lack of Transportation (Non-Medical): No  Physical Activity: Insufficiently Active (08/07/2024)   Exercise Vital Sign    Days of Exercise per Week: 3 days    Minutes of Exercise per Session: 20 min  Stress: Stress Concern Present (08/07/2024)   [name] - Occupational Stress Questionnaire    Feeling of Stress: To some extent  Social Connections: Moderately Isolated (08/07/2024)  Social Connection and Isolation Panel    Frequency of Communication with Friends and Family: More than three times a week    Frequency of Social Gatherings with Friends and Family: Twice a week    Attends Religious Services: More than 4 times per year    Active Member of [employer] or Organizations: No    Attends Banker Meetings: Not on file    Marital Status: Widowed  Intimate Partner Violence: Not At Risk (12/05/2023)   Humiliation, Afraid, Rape, and Kick questionnaire    Fear of Current or Ex-Partner: No    Emotionally Abused: No    Physically Abused: No    Sexually Abused: No    Outpatient Medications Prior to Visit  Medication Sig Dispense Refill   amoxicillin  (AMOXIL ) 500 MG tablet Take 4 tablets (2,000 mg total) by mouth once as needed for up to 1 dose (Take 4 tablets 2 hours prior to dental procedures). 4 tablet 4   aspirin  (ASPIRIN  LOW DOSE) 81 MG chewable tablet CHEW 1 TABLET BY MOUTH EVERY DAY 90 tablet 3   Blood Glucose Calibration (TRUE METRIX LEVEL 1) Low SOLN 1 each by In Vitro route in the morning and at bedtime. 100 each 3   Blood Glucose Monitoring Suppl (TRUE METRIX METER) DEVI 1 each by Does not apply route in the morning and at bedtime. 1 each 0    carvedilol  (COREG ) 12.5 MG tablet Take 1 tablet (12.5 mg total) by mouth 2 (two) times daily with a meal. 180 tablet 3   carvedilol  (COREG ) 25 MG tablet TAKE 1 TABLET BY MOUTH TWICE A DAY WITH FOOD 180 tablet 3   citalopram  (CELEXA ) 20 MG tablet TAKE 1 TABLET EVERY DAY ALONG WITH 40MG  TO EQUAL 60MG  TOTAL 90 tablet 1   citalopram  (CELEXA ) 40 MG tablet TAKE 1 TABLET EVERY DAY ALONG WITH 20MG  TO EQUAL 60MG  TOTAL, 90 tablet 1   empagliflozin  (JARDIANCE ) 10 MG TABS tablet TAKE 1 TABLET BY MOUTH EVERY DAY 90 tablet 3   fluticasone  (FLONASE ) 50 MCG/ACT nasal spray USE 2 SPRAYS IN EACH NOSTIRL EVERY DAY 16 g 1   furosemide  (LASIX ) 20 MG tablet TAKE 1 TABLET BY MOUTH EVERY DAY 90 tablet 3   furosemide  (LASIX ) 40 MG tablet Take 40 mg  as needed only for edema/weight gain >3lb in 24h or >5 lb in a week 20 tablet 6   furosemide  (LASIX ) 40 MG tablet TAKE 1/2 TABLET BY MOUTH DAILY 45 tablet 3   glucose blood (TRUE METRIX BLOOD GLUCOSE TEST) test strip USE AS INSTRUCTED TWICE A DAY IN THE MORNING AND AT BEDTIME 300 strip 12   hyoscyamine  (LEVSIN  SL) 0.125 MG SL tablet TAKE 1 TABLET BY MOUTH TWICE A DAY AS NEEDED FOR CRAMPING OR DIARRHEA OR LOOSE STOOLS 180 tablet 1   losartan  (COZAAR ) 25 MG tablet TAKE 1/2 TABLET BY MOUTH EVERY DAY AT BEDTIME 45 tablet 1   omeprazole  (PRILOSEC) 20 MG capsule Take 1 capsule (20 mg total) by mouth daily. 90 capsule 3   potassium chloride  SA (KLOR-CON  M) 20 MEQ tablet TAKE 1 TABLET BY MOUTH EVERY DAY 90 tablet 4   Probiotic Product (PRO-BIOTIC BLEND PO) Take 1 tablet by mouth daily.      rosuvastatin  (CRESTOR ) 20 MG tablet TAKE 1 TABLET BY MOUTH EVERY DAY 30 tablet 0   Semaglutide , 1 MG/DOSE, (OZEMPIC , 1 MG/DOSE,) 4 MG/3ML SOPN Inject 1 mg into the skin once a week. Needs appt 3 mL 0   TRUEplus  Lancets 33G MISC TEST BLOOD SUGAR TWICE DAILY IN THE MORNING AND AT BEDTIME 200 each 12   ALPRAZolam  (XANAX ) 0.5 MG tablet Take 1 tablet (0.5 mg total) by mouth 3 (three) times daily as  needed. 90 tablet 0   Facility-Administered Medications Prior to Visit  Medication Dose Route Frequency Provider Last Rate Last Admin   diclofenac  sodium (VOLTAREN ) 1 % transdermal gel 2 g  2 g Topical QID Antonio Meth, Brantley Naser R, DO        Allergies  Allergen Reactions   Oxycodone  Itching   Vicodin [Hydrocodone-Acetaminophen ] Itching   Latex Itching and Rash   Neomycin-Bacitracin Zn-Polymyx Other (See Comments)    Red , swollen eye   Sulfonamide Derivatives Other (See Comments)    Just had reaction when taking during pregnancy- last child birth 109    Review of Systems  Constitutional:  Negative for fever and malaise/fatigue.  HENT:  Negative for congestion.   Eyes:  Negative for blurred vision.  Respiratory:  Negative for shortness of breath.   Cardiovascular:  Negative for chest pain, palpitations and leg swelling.  Gastrointestinal:  Negative for abdominal pain, blood in stool and nausea.  Genitourinary:  Negative for dysuria and frequency.  Musculoskeletal:  Negative for falls.  Skin:  Negative for rash.  Neurological:  Negative for dizziness, loss of consciousness and headaches.  Endo/Heme/Allergies:  Negative for environmental allergies.  Psychiatric/Behavioral:  Negative for depression. The patient is nervous/anxious.        Objective:    Physical Exam Vitals and nursing note reviewed.  Constitutional:      General: She is not in acute distress.    Appearance: Normal appearance. She is well-developed.  HENT:     Head: Normocephalic and atraumatic.     Right Ear: Tympanic membrane, ear canal and external ear normal. There is no impacted cerumen.     Left Ear: Tympanic membrane, ear canal and external ear normal. There is no impacted cerumen.     Nose: Nose normal.     Mouth/Throat:     Mouth: Mucous membranes are moist.     Pharynx: Oropharynx is clear. No oropharyngeal exudate or posterior oropharyngeal erythema.  Eyes:     General: No scleral icterus.        Right eye: No discharge.        Left eye: No discharge.     Conjunctiva/sclera: Conjunctivae normal.     Pupils: Pupils are equal, round, and reactive to light.  Neck:     Thyroid : No thyromegaly or thyroid  tenderness.     Vascular: No JVD.  Cardiovascular:     Rate and Rhythm: Normal rate and regular rhythm.     Heart sounds: Normal heart sounds. No murmur heard. Pulmonary:     Effort: Pulmonary effort is normal. No respiratory distress.     Breath sounds: Normal breath sounds.  Abdominal:     General: Bowel sounds are normal. There is no distension.     Palpations: Abdomen is soft. There is no mass.     Tenderness: There is no abdominal tenderness. There is no guarding or rebound.  Musculoskeletal:        General: Normal range of motion.     Cervical back: Normal range of motion and neck supple.     Right lower leg: No edema.     Left lower leg: No edema.  Lymphadenopathy:     Cervical: No cervical adenopathy.  Skin:    General: Skin is warm  and dry.     Findings: No erythema or rash.  Neurological:     Mental Status: She is alert and oriented to person, place, and time.     Cranial Nerves: No cranial nerve deficit.     Deep Tendon Reflexes: Reflexes are normal and symmetric.  Psychiatric:        Mood and Affect: Mood normal.        Behavior: Behavior normal.        Thought Content: Thought content normal.        Judgment: Judgment normal.     BP (!) 140/80   Pulse 91   Temp 97.6 F (36.4 C)   Ht 4' 11 (1.499 m)   Wt 186 lb (84.4 kg)   SpO2 95%   BMI 37.57 kg/m  Wt Readings from Last 3 Encounters:  08/09/24 186 lb (84.4 kg)  03/22/24 179 lb 12.8 oz (81.6 kg)  12/23/23 190 lb (86.2 kg)    Diabetic Foot Exam - Simple   No data filed    Lab Results  Component Value Date   WBC 5.5 12/23/2023   HGB 12.8 12/23/2023   HCT 38.8 12/23/2023   PLT 159 12/23/2023   GLUCOSE 90 12/23/2023   CHOL 204 (H) 12/23/2023   TRIG 147 12/23/2023   HDL 66 12/23/2023    LDLDIRECT 217.7 09/13/2011   LDLCALC 112 (H) 12/23/2023   ALT 10 12/23/2023   AST 16 12/23/2023   NA 139 12/23/2023   K 4.7 12/23/2023   CL 102 12/23/2023   CREATININE 1.33 (H) 12/23/2023   BUN 26 (H) 12/23/2023   CO2 25 12/23/2023   TSH 0.88 12/23/2023   INR 1.1 01/25/2020   HGBA1C 5.7 (H) 12/23/2023   MICROALBUR 0.8 12/23/2023    Lab Results  Component Value Date   TSH 0.88 12/23/2023   Lab Results  Component Value Date   WBC 5.5 12/23/2023   HGB 12.8 12/23/2023   HCT 38.8 12/23/2023   MCV 104.9 (H) 12/23/2023   PLT 159 12/23/2023   Lab Results  Component Value Date   NA 139 12/23/2023   K 4.7 12/23/2023   CO2 25 12/23/2023   GLUCOSE 90 12/23/2023   BUN 26 (H) 12/23/2023   CREATININE 1.33 (H) 12/23/2023   BILITOT 0.5 12/23/2023   ALKPHOS 41 11/01/2023   AST 16 12/23/2023   ALT 10 12/23/2023   PROT 7.2 12/23/2023   ALBUMIN 4.2 11/01/2023   CALCIUM  10.3 12/23/2023   ANIONGAP 10 11/16/2023   EGFR 42.0 01/10/2024   GFR 36.40 (L) 11/01/2023   Lab Results  Component Value Date   CHOL 204 (H) 12/23/2023   Lab Results  Component Value Date   HDL 66 12/23/2023   Lab Results  Component Value Date   LDLCALC 112 (H) 12/23/2023   Lab Results  Component Value Date   TRIG 147 12/23/2023   Lab Results  Component Value Date   CHOLHDL 3.1 12/23/2023   Lab Results  Component Value Date   HGBA1C 5.7 (H) 12/23/2023       Assessment & Plan:  Preventative [name] care  Uncontrolled type 2 diabetes mellitus with hyperglycemia (HCC) Assessment & Plan: hgba1c to be checked,  minimize simple carbs. Increase exercise as tolerated. Continue current meds   Orders: -     Hemoglobin A1c -     Microalbumin / creatinine urine ratio -     Microalbumin / creatinine urine ratio -     POCT Urinalysis  Dipstick (Automated)  Anxiety -     clonazePAM ; Take 1 tablet (0.5 mg total) by mouth 3 (three) times daily as needed for anxiety.  Dispense: 90 tablet; Refill:  1  Hyperlipidemia associated with type 2 diabetes mellitus (HCC) Assessment & Plan: Encourage heart healthy diet such as MIND or DASH diet, increase exercise, avoid trans fats, simple carbohydrates and processed foods, consider a krill or fish or flaxseed oil cap daily.    Orders: -     Comprehensive metabolic panel with GFR -     Lipid panel  Essential hypertension -     Comprehensive metabolic panel with GFR -     Lipid panel -     Microalbumin / creatinine urine ratio -     POCT Urinalysis Dipstick (Automated)  Stage 3b chronic kidney disease (HCC) -     Comprehensive metabolic panel with GFR  Paroxysmal atrial flutter (HCC) Assessment & Plan: Per cardiology  Orders: -     CBC with Differential/Platelet -     Comprehensive metabolic panel with GFR -     TSH  Need for influenza vaccination -     Flu vaccine HIGH DOSE PF(Fluzone  Trivalent)  Severe aortic stenosis Assessment & Plan: Per vascular/ cardiology   Primary hypertension Assessment & Plan: Well controlled, no changes to meds. Encouraged heart healthy diet such as the DASH diet and exercise as tolerated.      Assessment and Plan Assessment & Plan Major depressive disorder and generalized anxiety disorder   Chronic major depressive disorder and generalized anxiety disorder persist with stressors from family dynamics and estate planning. Currently on citalopram  and Xanax , but Xanax 's effect is insufficient. Discussed addiction risks of increasing Xanax . Citalopram  is at maximum dose. Offered Klonopin  as a longer-lasting, less addictive alternative to Xanax , and she agreed. Discontinue Xanax , prescribe Klonopin  2-3 times daily, and continue Xanax  until Klonopin  is available.  Adult wellness visit   Routine wellness visit for a 78 year old female. Discussed family dynamics and estate planning stressors.   Chinara Hertzberg R Lowne Chase, DO

## 2024-08-09 NOTE — Assessment & Plan Note (Signed)
 Encourage heart healthy diet such as MIND or DASH diet, increase exercise, avoid trans fats, simple carbohydrates and processed foods, consider a krill or fish or flaxseed oil cap daily.

## 2024-08-09 NOTE — Assessment & Plan Note (Signed)
 hgba1c to be checked, minimize simple carbs. Increase exercise as tolerated. Continue current meds

## 2024-08-09 NOTE — Telephone Encounter (Signed)
 Copied from CRM #[id number]. Topic: Clinical - Medication Question >> Aug 09, 2024 12:07 PM Sasha M wrote: Reason for CRM: The pharmacist at [facility] called to confirm a therapy change due to medication switch. Pt just filled alprazolam  on 8/31 but pharmacy received script for clonazePAM  (KLONOPIN ) 0.5 MG tablet and pharmacist needs confirmation. Please call back at [phone]

## 2024-08-09 NOTE — Assessment & Plan Note (Signed)
 Per cardiology

## 2024-08-13 ENCOUNTER — Telehealth: Payer: Self-pay | Admitting: Cardiovascular Disease

## 2024-08-13 NOTE — Telephone Encounter (Signed)
Noted will forward to provider  

## 2024-08-13 NOTE — Telephone Encounter (Signed)
 Patient called to say that she had her blood work already done and it in her Clinical cytogeneticist. Please advise

## 2024-08-14 NOTE — Telephone Encounter (Signed)
 Left message that patient is good to go ahead to her scheduled tests 08/22/24. Left call back number as well

## 2024-08-15 ENCOUNTER — Ambulatory Visit: Payer: Self-pay | Admitting: Family Medicine

## 2024-08-22 ENCOUNTER — Ambulatory Visit: Payer: Self-pay | Admitting: Cardiovascular Disease

## 2024-08-22 ENCOUNTER — Ambulatory Visit (HOSPITAL_COMMUNITY)
Admission: RE | Admit: 2024-08-22 | Discharge: 2024-08-22 | Disposition: A | Discharge status: Home / Self Care | Source: Ambulatory Visit | Attending: Cardiovascular Disease | Admitting: Cardiovascular Disease

## 2024-08-22 DIAGNOSIS — Z952 Presence of prosthetic heart valve: Secondary | ICD-10-CM | POA: Insufficient documentation

## 2024-08-22 DIAGNOSIS — I7103 Dissection of thoracoabdominal aorta: Secondary | ICD-10-CM | POA: Insufficient documentation

## 2024-08-22 DIAGNOSIS — K5730 Diverticulosis of large intestine without perforation or abscess without bleeding: Secondary | ICD-10-CM | POA: Diagnosis not present

## 2024-08-22 DIAGNOSIS — E278 Other specified disorders of adrenal gland: Secondary | ICD-10-CM | POA: Diagnosis not present

## 2024-08-22 DIAGNOSIS — I7101 Dissection of ascending aorta: Secondary | ICD-10-CM

## 2024-08-22 DIAGNOSIS — I71011 Dissection of aortic arch: Secondary | ICD-10-CM | POA: Diagnosis not present

## 2024-08-22 DIAGNOSIS — I359 Nonrheumatic aortic valve disorder, unspecified: Secondary | ICD-10-CM | POA: Diagnosis not present

## 2024-08-22 DIAGNOSIS — N2889 Other specified disorders of kidney and ureter: Secondary | ICD-10-CM | POA: Diagnosis not present

## 2024-08-22 LAB — ECHOCARDIOGRAM COMPLETE
AV Mean grad: 10.5 mmHg
AV Peak grad: 19.2 mmHg
Ao pk vel: 2.19 m/s
Area-P 1/2: 3.03 cm2
S' Lateral: 2.7 cm

## 2024-08-22 MED ORDER — PERFLUTREN LIPID MICROSPHERE
1.0000 mL | INTRAVENOUS | Status: DC | PRN
  Administered 2024-08-22: 2 mL via INTRAVENOUS

## 2024-08-22 MED ORDER — IOHEXOL 350 MG/ML SOLN
75.0000 mL | Freq: Once | INTRAVENOUS | Status: AC | PRN
  Administered 2024-08-22: 75 mL via INTRAVENOUS

## 2024-08-27 ENCOUNTER — Encounter

## 2024-08-27 ENCOUNTER — Ambulatory Visit: Payer: Self-pay | Admitting: Cardiovascular Disease

## 2024-08-29 NOTE — Progress Notes (Signed)
 Remote ICD Transmission

## 2024-08-30 ENCOUNTER — Other Ambulatory Visit: Payer: Self-pay | Admitting: Family Medicine

## 2024-08-30 DIAGNOSIS — E1169 Type 2 diabetes mellitus with other specified complication: Secondary | ICD-10-CM

## 2024-09-06 ENCOUNTER — Other Ambulatory Visit: Payer: Self-pay | Admitting: Family Medicine

## 2024-09-06 DIAGNOSIS — E1165 Type 2 diabetes mellitus with hyperglycemia: Secondary | ICD-10-CM

## 2024-09-26 NOTE — Progress Notes (Signed)
 Remote ICD transmission.

## 2024-10-01 ENCOUNTER — Encounter: Payer: Self-pay | Admitting: Family Medicine

## 2024-10-01 ENCOUNTER — Ambulatory Visit: Attending: Cardiovascular Disease

## 2024-10-01 DIAGNOSIS — I5032 Chronic diastolic (congestive) heart failure: Secondary | ICD-10-CM

## 2024-10-01 DIAGNOSIS — K580 Irritable bowel syndrome with diarrhea: Secondary | ICD-10-CM

## 2024-10-01 MED ORDER — HYOSCYAMINE SULFATE 0.125 MG SL SUBL: 0.1250 mg | SUBLINGUAL_TABLET | Freq: Two times a day (BID) | SUBLINGUAL | 1 refills | Status: AC | PRN

## 2024-10-02 LAB — CUP PACEART REMOTE DEVICE CHECK
Battery Remaining Longevity: 23 mo
Battery Voltage: 2.94 V
Brady Statistic AP VP Percent: 92.72 %
Brady Statistic AP VS Percent: 0.42 %
Brady Statistic AS VP Percent: 6.86 %
Brady Statistic AS VS Percent: 0.01 %
Brady Statistic RA Percent Paced: 93.08 %
Brady Statistic RV Percent Paced: 99.55 %
Date Time Interrogation Session: 20251101191504
HighPow Impedance: 87 Ohm
Implantable Lead Connection Status: 753985
Implantable Lead Connection Status: 753985
Implantable Lead Connection Status: 753985
Implantable Lead Implant Date: 20071016
Implantable Lead Implant Date: 20071016
Implantable Lead Implant Date: 20200730
Implantable Lead Location: 753859
Implantable Lead Location: 753860
Implantable Lead Location: 753860
Implantable Lead Model: 4092
Implantable Lead Model: 5594
Implantable Lead Model: 6935
Implantable Pulse Generator Implant Date: 20200730
Lead Channel Impedance Value: 380 Ohm
Lead Channel Impedance Value: 456 Ohm
Lead Channel Impedance Value: 513 Ohm
Lead Channel Pacing Threshold Amplitude: 0.5 V
Lead Channel Pacing Threshold Amplitude: 0.875 V
Lead Channel Pacing Threshold Pulse Width: 0.4 ms
Lead Channel Pacing Threshold Pulse Width: 0.4 ms
Lead Channel Sensing Intrinsic Amplitude: 26.375 mV
Lead Channel Sensing Intrinsic Amplitude: 26.375 mV
Lead Channel Sensing Intrinsic Amplitude: 3.75 mV
Lead Channel Sensing Intrinsic Amplitude: 3.75 mV
Lead Channel Setting Pacing Amplitude: 1.75 V
Lead Channel Setting Pacing Amplitude: 2.5 V
Lead Channel Setting Pacing Pulse Width: 0.4 ms
Lead Channel Setting Sensing Sensitivity: 0.3 mV
Zone Setting Status: 755011
Zone Setting Status: 755011

## 2024-10-05 NOTE — Progress Notes (Signed)
 Remote ICD Transmission

## 2024-10-06 ENCOUNTER — Ambulatory Visit: Payer: Self-pay | Admitting: Cardiovascular Disease

## 2024-10-21 ENCOUNTER — Other Ambulatory Visit: Payer: Self-pay | Admitting: Family Medicine

## 2024-10-21 ENCOUNTER — Other Ambulatory Visit: Payer: Self-pay | Admitting: Cardiovascular Disease

## 2024-10-21 DIAGNOSIS — I5032 Chronic diastolic (congestive) heart failure: Secondary | ICD-10-CM

## 2024-10-21 DIAGNOSIS — F419 Anxiety disorder, unspecified: Secondary | ICD-10-CM

## 2024-10-22 NOTE — Telephone Encounter (Signed)
 Requesting: clonazepam  0.5mg  Contract: 11/02/22 UDS: 11/01/23 Last Visit: 08/09/24 Next Visit: None Last Refill: 08/09/24 #90 and 1RF   Please Advise

## 2024-10-22 NOTE — Telephone Encounter (Signed)
 Patient called. Following up on medication refill. Please send to the pharmacy. Will be out of town tomorrow morning.

## 2024-11-26 ENCOUNTER — Encounter

## 2024-12-02 ENCOUNTER — Other Ambulatory Visit: Payer: Self-pay | Admitting: Family Medicine

## 2024-12-02 DIAGNOSIS — F419 Anxiety disorder, unspecified: Secondary | ICD-10-CM

## 2024-12-10 ENCOUNTER — Other Ambulatory Visit: Payer: Self-pay | Admitting: Family Medicine

## 2024-12-10 DIAGNOSIS — I10 Essential (primary) hypertension: Secondary | ICD-10-CM

## 2024-12-10 DIAGNOSIS — R1310 Dysphagia, unspecified: Secondary | ICD-10-CM

## 2024-12-21 ENCOUNTER — Telehealth: Payer: Self-pay

## 2024-12-21 ENCOUNTER — Other Ambulatory Visit: Payer: Self-pay | Admitting: Family Medicine

## 2024-12-21 DIAGNOSIS — K580 Irritable bowel syndrome with diarrhea: Secondary | ICD-10-CM

## 2024-12-21 DIAGNOSIS — E1165 Type 2 diabetes mellitus with hyperglycemia: Secondary | ICD-10-CM

## 2024-12-21 MED ORDER — DICYCLOMINE HCL 10 MG PO CAPS: 10.0000 mg | ORAL_CAPSULE | Freq: Three times a day (TID) | ORAL | 1 refills | Status: AC

## 2024-12-21 NOTE — Telephone Encounter (Signed)
 Mychart message sent to make aware of changes.

## 2024-12-21 NOTE — Telephone Encounter (Signed)
 Copied from CRM #[id number]. Topic: Clinical - Prescription Issue >> Dec 21, 2024 12:00 PM Avram MATSU wrote: Reason for CRM: patient was prescribed hyoscyamine  sulfate ER but was not approved and recommend dicyclomine as a 10 mg capsule. If provider feels if there's another medication that works better or sticking with the hyoscyamine  call the provider line to get it approved. [phone]

## 2024-12-31 ENCOUNTER — Ambulatory Visit

## 2025-01-02 ENCOUNTER — Telehealth: Payer: Self-pay | Admitting: Cardiovascular Disease

## 2025-01-02 NOTE — Telephone Encounter (Signed)
 Called re missed remote transmission

## 2025-01-03 ENCOUNTER — Telehealth: Payer: Self-pay

## 2025-01-03 ENCOUNTER — Other Ambulatory Visit (HOSPITAL_COMMUNITY): Payer: Self-pay

## 2025-01-03 NOTE — Telephone Encounter (Signed)
 Pharmacy Patient Advocate Encounter   Received notification from Physician's Office that prior authorization for Ozempic  is required/requested.   Insurance verification completed.   The patient is insured through [employer].   Per test claim: Refill too soon. PA is not needed at this time. Medication was filled 12/21/24. Next eligible fill date is 01/11/25.

## 2025-01-04 LAB — CUP PACEART REMOTE DEVICE CHECK
Battery Remaining Longevity: 22 mo
Battery Voltage: 2.93 V
Brady Statistic AP VP Percent: 89.84 %
Brady Statistic AP VS Percent: 0 %
Brady Statistic AS VP Percent: 10.15 %
Brady Statistic AS VS Percent: 0 %
Brady Statistic RA Percent Paced: 89.79 %
Brady Statistic RV Percent Paced: 99.97 %
Date Time Interrogation Session: 20260205173111
HighPow Impedance: 82 Ohm
Implantable Lead Connection Status: 753985
Implantable Lead Connection Status: 753985
Implantable Lead Connection Status: 753985
Implantable Lead Implant Date: 20071016
Implantable Lead Implant Date: 20071016
Implantable Lead Implant Date: 20200730
Implantable Lead Location: 753859
Implantable Lead Location: 753860
Implantable Lead Location: 753860
Implantable Lead Model: 4092
Implantable Lead Model: 5594
Implantable Lead Model: 6935
Implantable Pulse Generator Implant Date: 20200730
Lead Channel Impedance Value: 380 Ohm
Lead Channel Impedance Value: 551 Ohm
Lead Channel Impedance Value: 608 Ohm
Lead Channel Pacing Threshold Amplitude: 0.5 V
Lead Channel Pacing Threshold Amplitude: 1 V
Lead Channel Pacing Threshold Pulse Width: 0.4 ms
Lead Channel Pacing Threshold Pulse Width: 0.4 ms
Lead Channel Sensing Intrinsic Amplitude: 26.375 mV
Lead Channel Sensing Intrinsic Amplitude: 26.375 mV
Lead Channel Sensing Intrinsic Amplitude: 3.5 mV
Lead Channel Sensing Intrinsic Amplitude: 3.5 mV
Lead Channel Setting Pacing Amplitude: 2 V
Lead Channel Setting Pacing Amplitude: 2.5 V
Lead Channel Setting Pacing Pulse Width: 0.4 ms
Lead Channel Setting Sensing Sensitivity: 0.3 mV
Zone Setting Status: 755011
Zone Setting Status: 755011

## 2025-02-25 ENCOUNTER — Encounter

## 2025-04-01 ENCOUNTER — Ambulatory Visit: Payer: Self-pay

## 2025-05-27 ENCOUNTER — Encounter

## 2025-07-01 ENCOUNTER — Ambulatory Visit: Payer: Self-pay
